# Patient Record
Sex: Female | Born: 1960 | Race: White | Hispanic: No | Marital: Married | Smoking: Former smoker
Health system: Southern US, Community
[De-identification: ages and names within clinical notes are randomized; demographics above are authoritative.]

## PROBLEM LIST (undated history)

## (undated) DIAGNOSIS — M1990 Unspecified osteoarthritis, unspecified site: Secondary | ICD-10-CM

## (undated) DIAGNOSIS — G4730 Sleep apnea, unspecified: Secondary | ICD-10-CM

## (undated) DIAGNOSIS — R112 Nausea with vomiting, unspecified: Secondary | ICD-10-CM

## (undated) DIAGNOSIS — I493 Ventricular premature depolarization: Secondary | ICD-10-CM

## (undated) DIAGNOSIS — Z9889 Other specified postprocedural states: Secondary | ICD-10-CM

## (undated) DIAGNOSIS — N200 Calculus of kidney: Secondary | ICD-10-CM

## (undated) DIAGNOSIS — M810 Age-related osteoporosis without current pathological fracture: Secondary | ICD-10-CM

## (undated) DIAGNOSIS — I10 Essential (primary) hypertension: Secondary | ICD-10-CM

## (undated) DIAGNOSIS — Z923 Personal history of irradiation: Secondary | ICD-10-CM

## (undated) DIAGNOSIS — K219 Gastro-esophageal reflux disease without esophagitis: Secondary | ICD-10-CM

## (undated) DIAGNOSIS — R7303 Prediabetes: Secondary | ICD-10-CM

## (undated) DIAGNOSIS — IMO0001 Reserved for inherently not codable concepts without codable children: Secondary | ICD-10-CM

## (undated) DIAGNOSIS — Z9221 Personal history of antineoplastic chemotherapy: Secondary | ICD-10-CM

## (undated) DIAGNOSIS — M109 Gout, unspecified: Secondary | ICD-10-CM

## (undated) DIAGNOSIS — M549 Dorsalgia, unspecified: Secondary | ICD-10-CM

## (undated) DIAGNOSIS — E039 Hypothyroidism, unspecified: Secondary | ICD-10-CM

## (undated) DIAGNOSIS — R0600 Dyspnea, unspecified: Secondary | ICD-10-CM

## (undated) DIAGNOSIS — IMO0002 Reserved for concepts with insufficient information to code with codable children: Secondary | ICD-10-CM

## (undated) DIAGNOSIS — T7840XA Allergy, unspecified, initial encounter: Secondary | ICD-10-CM

## (undated) DIAGNOSIS — J189 Pneumonia, unspecified organism: Secondary | ICD-10-CM

## (undated) DIAGNOSIS — Z87442 Personal history of urinary calculi: Secondary | ICD-10-CM

## (undated) DIAGNOSIS — K760 Fatty (change of) liver, not elsewhere classified: Secondary | ICD-10-CM

## (undated) DIAGNOSIS — F419 Anxiety disorder, unspecified: Secondary | ICD-10-CM

## (undated) DIAGNOSIS — E7800 Pure hypercholesterolemia, unspecified: Secondary | ICD-10-CM

## (undated) DIAGNOSIS — C50919 Malignant neoplasm of unspecified site of unspecified female breast: Secondary | ICD-10-CM

## (undated) DIAGNOSIS — L409 Psoriasis, unspecified: Secondary | ICD-10-CM

## (undated) DIAGNOSIS — R51 Headache: Secondary | ICD-10-CM

## (undated) HISTORY — DX: Gout, unspecified: M10.9

## (undated) HISTORY — DX: Malignant neoplasm of unspecified site of unspecified female breast: C50.919

## (undated) HISTORY — DX: Psoriasis, unspecified: L40.9

## (undated) HISTORY — PX: MYOMECTOMY: SHX85

## (undated) HISTORY — DX: Gastro-esophageal reflux disease without esophagitis: K21.9

## (undated) HISTORY — DX: Reserved for concepts with insufficient information to code with codable children: IMO0002

## (undated) HISTORY — PX: CHOLECYSTECTOMY: SHX55

## (undated) HISTORY — DX: Unspecified osteoarthritis, unspecified site: M19.90

## (undated) HISTORY — DX: Reserved for inherently not codable concepts without codable children: IMO0001

## (undated) HISTORY — PX: TONSILLECTOMY: SUR1361

## (undated) HISTORY — DX: Allergy, unspecified, initial encounter: T78.40XA

## (undated) HISTORY — DX: Age-related osteoporosis without current pathological fracture: M81.0

## (undated) HISTORY — PX: TUBAL LIGATION: SHX77

## (undated) HISTORY — DX: Dorsalgia, unspecified: M54.9

## (undated) HISTORY — PX: CARDIAC CATHETERIZATION: SHX172

## (undated) HISTORY — PX: OTHER SURGICAL HISTORY: SHX169

## (undated) HISTORY — PX: LIPOMA EXCISION: SHX5283

---

## 1998-03-22 ENCOUNTER — Other Ambulatory Visit: Admission: RE | Admit: 1998-03-22 | Discharge: 1998-03-22 | Payer: Self-pay | Admitting: Obstetrics and Gynecology

## 2000-04-14 ENCOUNTER — Encounter: Payer: Self-pay | Admitting: *Deleted

## 2000-04-18 ENCOUNTER — Observation Stay (HOSPITAL_COMMUNITY): Admission: RE | Admit: 2000-04-18 | Discharge: 2000-04-19 | Payer: Self-pay | Admitting: *Deleted

## 2000-04-18 ENCOUNTER — Encounter (INDEPENDENT_AMBULATORY_CARE_PROVIDER_SITE_OTHER): Payer: Self-pay | Admitting: Specialist

## 2000-12-22 ENCOUNTER — Other Ambulatory Visit: Admission: RE | Admit: 2000-12-22 | Discharge: 2000-12-22 | Payer: Self-pay | Admitting: Obstetrics and Gynecology

## 2001-02-09 ENCOUNTER — Encounter (INDEPENDENT_AMBULATORY_CARE_PROVIDER_SITE_OTHER): Payer: Self-pay | Admitting: Specialist

## 2001-02-09 ENCOUNTER — Observation Stay (HOSPITAL_COMMUNITY): Admission: RE | Admit: 2001-02-09 | Discharge: 2001-02-09 | Payer: Self-pay | Admitting: Obstetrics and Gynecology

## 2002-02-08 ENCOUNTER — Other Ambulatory Visit: Admission: RE | Admit: 2002-02-08 | Discharge: 2002-02-08 | Payer: Self-pay | Admitting: Obstetrics and Gynecology

## 2003-03-04 ENCOUNTER — Ambulatory Visit (HOSPITAL_COMMUNITY): Admission: RE | Admit: 2003-03-04 | Discharge: 2003-03-04 | Payer: Self-pay | Admitting: Obstetrics and Gynecology

## 2003-03-04 ENCOUNTER — Encounter (INDEPENDENT_AMBULATORY_CARE_PROVIDER_SITE_OTHER): Payer: Self-pay

## 2006-08-25 ENCOUNTER — Ambulatory Visit: Payer: Self-pay | Admitting: Family Medicine

## 2006-08-25 LAB — CONVERTED CEMR LAB
BUN: 17 mg/dL (ref 6–23)
Calcium: 9.1 mg/dL (ref 8.4–10.5)
Chloride: 108 meq/L (ref 96–112)
Creatinine, Ser: 0.7 mg/dL (ref 0.4–1.2)

## 2006-09-22 ENCOUNTER — Ambulatory Visit: Payer: Self-pay | Admitting: Family Medicine

## 2006-09-22 LAB — CONVERTED CEMR LAB
Chol/HDL Ratio, serum: 4.8
Cholesterol: 208 mg/dL (ref 0–200)
GFR calc non Af Amer: 82 mL/min
Glucose, Bld: 88 mg/dL (ref 70–99)
HDL: 43.4 mg/dL (ref >39.0)
LDL DIRECT: 149.3 mg/dL
Potassium: 3.4 meq/L — ABNORMAL LOW (ref 3.5–5.1)
Sodium: 142 meq/L (ref 135–145)
Triglyceride fasting, serum: 209 mg/dL (ref 0–149)
VLDL: 42 mg/dL — ABNORMAL HIGH (ref 0–40)

## 2006-10-02 ENCOUNTER — Ambulatory Visit: Payer: Self-pay | Admitting: Family Medicine

## 2006-12-22 ENCOUNTER — Ambulatory Visit: Payer: Self-pay | Admitting: Family Medicine

## 2006-12-22 LAB — CONVERTED CEMR LAB
CO2: 29 meq/L (ref 19–32)
GFR calc Af Amer: 115 mL/min
GFR calc non Af Amer: 95 mL/min
Glucose, Bld: 100 mg/dL — ABNORMAL HIGH (ref 70–99)
Sodium: 142 meq/L (ref 135–145)

## 2007-01-05 ENCOUNTER — Ambulatory Visit: Payer: Self-pay | Admitting: Family Medicine

## 2007-01-19 ENCOUNTER — Ambulatory Visit: Payer: Self-pay | Admitting: Family Medicine

## 2007-01-19 LAB — CONVERTED CEMR LAB
Basophils Absolute: 0 10*3/uL (ref 0.0–0.1)
Chloride: 108 meq/L (ref 96–112)
Eosinophils Absolute: 0.2 10*3/uL (ref 0.0–0.6)
Eosinophils Relative: 2 % (ref 0.0–5.0)
GFR calc Af Amer: 137 mL/min
GFR calc non Af Amer: 113 mL/min
Glucose, Bld: 109 mg/dL — ABNORMAL HIGH (ref 70–99)
HCT: 36.4 % (ref 36.0–46.0)
Lymphocytes Relative: 20 % (ref 12.0–46.0)
MCHC: 34.7 g/dL (ref 30.0–36.0)
MCV: 91 fL (ref 78.0–100.0)
Magnesium: 2 mg/dL (ref 1.5–2.5)
Neutro Abs: 6.8 10*3/uL (ref 1.4–7.7)
Neutrophils Relative %: 69.6 % (ref 43.0–77.0)
Platelets: 329 10*3/uL (ref 150–400)
RBC: 4 M/uL (ref 3.87–5.11)
Sodium: 142 meq/L (ref 135–145)

## 2007-01-28 ENCOUNTER — Ambulatory Visit: Payer: Self-pay | Admitting: Cardiovascular Disease

## 2007-02-09 ENCOUNTER — Ambulatory Visit: Payer: Self-pay

## 2007-02-09 ENCOUNTER — Encounter: Payer: Self-pay | Admitting: Cardiology

## 2007-09-02 ENCOUNTER — Ambulatory Visit: Payer: Self-pay | Admitting: Family Medicine

## 2007-09-02 DIAGNOSIS — J019 Acute sinusitis, unspecified: Secondary | ICD-10-CM | POA: Insufficient documentation

## 2007-09-02 DIAGNOSIS — I10 Essential (primary) hypertension: Secondary | ICD-10-CM

## 2007-09-02 DIAGNOSIS — J309 Allergic rhinitis, unspecified: Secondary | ICD-10-CM | POA: Insufficient documentation

## 2007-09-02 DIAGNOSIS — J069 Acute upper respiratory infection, unspecified: Secondary | ICD-10-CM | POA: Insufficient documentation

## 2007-09-03 ENCOUNTER — Encounter (INDEPENDENT_AMBULATORY_CARE_PROVIDER_SITE_OTHER): Payer: Self-pay | Admitting: Family Medicine

## 2007-09-03 LAB — CONVERTED CEMR LAB
CO2: 27 meq/L (ref 19–32)
Calcium: 10.1 mg/dL (ref 8.4–10.5)
GFR calc Af Amer: 137 mL/min
GFR calc non Af Amer: 113 mL/min
Glucose, Bld: 103 mg/dL — ABNORMAL HIGH (ref 70–99)
Potassium: 3.9 meq/L (ref 3.5–5.1)
Sodium: 139 meq/L (ref 135–145)

## 2007-11-02 ENCOUNTER — Ambulatory Visit: Payer: Self-pay | Admitting: Family Medicine

## 2007-11-03 ENCOUNTER — Encounter (INDEPENDENT_AMBULATORY_CARE_PROVIDER_SITE_OTHER): Payer: Self-pay | Admitting: *Deleted

## 2007-11-03 LAB — CONVERTED CEMR LAB
Chloride: 103 meq/L (ref 96–112)
GFR calc Af Amer: 98 mL/min
GFR calc non Af Amer: 81 mL/min
Glucose, Bld: 101 mg/dL — ABNORMAL HIGH (ref 70–99)
Potassium: 3.9 meq/L (ref 3.5–5.1)
Sodium: 140 meq/L (ref 135–145)

## 2007-11-17 ENCOUNTER — Encounter: Payer: Self-pay | Admitting: Family Medicine

## 2007-12-04 ENCOUNTER — Ambulatory Visit: Payer: Self-pay | Admitting: Family Medicine

## 2007-12-07 LAB — CONVERTED CEMR LAB
Calcium: 10 mg/dL (ref 8.4–10.5)
Chloride: 103 meq/L (ref 96–112)
Cholesterol: 279 mg/dL (ref 0–200)
Creatinine, Ser: 0.7 mg/dL (ref 0.4–1.2)
GFR calc non Af Amer: 95 mL/min
Glucose, Bld: 92 mg/dL (ref 70–99)
Sodium: 139 meq/L (ref 135–145)
Total CHOL/HDL Ratio: 5.8

## 2007-12-08 LAB — CONVERTED CEMR LAB
AST: 45 units/L — ABNORMAL HIGH (ref 0–37)
BUN: 18 mg/dL (ref 6–23)
CO2: 28 meq/L (ref 19–32)
GFR calc Af Amer: 115 mL/min
GFR calc non Af Amer: 95 mL/min
Glucose, Bld: 92 mg/dL (ref 70–99)
HDL: 48.3 mg/dL (ref >39.0)
Potassium: 3.7 meq/L (ref 3.5–5.1)
Total CHOL/HDL Ratio: 5.8
Triglycerides: 175 mg/dL — ABNORMAL HIGH (ref 0–149)

## 2007-12-09 ENCOUNTER — Telehealth (INDEPENDENT_AMBULATORY_CARE_PROVIDER_SITE_OTHER): Payer: Self-pay | Admitting: Family Medicine

## 2007-12-10 ENCOUNTER — Telehealth (INDEPENDENT_AMBULATORY_CARE_PROVIDER_SITE_OTHER): Payer: Self-pay | Admitting: *Deleted

## 2007-12-10 ENCOUNTER — Encounter (INDEPENDENT_AMBULATORY_CARE_PROVIDER_SITE_OTHER): Payer: Self-pay | Admitting: *Deleted

## 2008-01-06 ENCOUNTER — Ambulatory Visit: Payer: Self-pay | Admitting: Family Medicine

## 2008-01-08 LAB — CONVERTED CEMR LAB
AFP-Tumor Marker: 3.6 ng/mL (ref 0.0–8.0)
Ceruloplasmin: 47 mg/dL (ref 21–63)

## 2008-01-10 ENCOUNTER — Encounter (INDEPENDENT_AMBULATORY_CARE_PROVIDER_SITE_OTHER): Payer: Self-pay | Admitting: *Deleted

## 2008-01-11 ENCOUNTER — Ambulatory Visit: Payer: Self-pay | Admitting: Family Medicine

## 2008-01-11 DIAGNOSIS — R5383 Other fatigue: Secondary | ICD-10-CM

## 2008-01-11 DIAGNOSIS — R5381 Other malaise: Secondary | ICD-10-CM | POA: Insufficient documentation

## 2008-01-12 ENCOUNTER — Ambulatory Visit: Payer: Self-pay | Admitting: Family Medicine

## 2008-01-13 ENCOUNTER — Encounter (INDEPENDENT_AMBULATORY_CARE_PROVIDER_SITE_OTHER): Payer: Self-pay | Admitting: *Deleted

## 2008-01-13 LAB — CONVERTED CEMR LAB
Alkaline Phosphatase: 65 units/L (ref 39–117)
Basophils Absolute: 0 10*3/uL (ref 0.0–0.1)
Bilirubin, Direct: 0.1 mg/dL (ref 0.0–0.3)
Cortisol, Plasma: 9.6 ug/dL
Eosinophils Absolute: 0.3 10*3/uL (ref 0.0–0.7)
HDL: 38.4 mg/dL — ABNORMAL LOW (ref >39.0)
MCHC: 32.7 g/dL (ref 30.0–36.0)
MCV: 92.8 fL (ref 78.0–100.0)
Neutrophils Relative %: 52.6 % (ref 43.0–77.0)
Platelets: 278 10*3/uL (ref 150–400)
T3, Free: 3.4 pg/mL (ref 2.3–4.2)
TSH: 10.73 microintl units/mL — ABNORMAL HIGH (ref 0.35–5.50)
Total Bilirubin: 0.5 mg/dL (ref 0.3–1.2)
Triglycerides: 198 mg/dL — ABNORMAL HIGH (ref 0–149)
VLDL: 40 mg/dL (ref 0–40)
Vit D, 1,25-Dihydroxy: 39 (ref 30–89)

## 2008-01-14 ENCOUNTER — Encounter (INDEPENDENT_AMBULATORY_CARE_PROVIDER_SITE_OTHER): Payer: Self-pay | Admitting: *Deleted

## 2008-02-18 ENCOUNTER — Ambulatory Visit: Payer: Self-pay | Admitting: Internal Medicine

## 2008-02-18 ENCOUNTER — Telehealth (INDEPENDENT_AMBULATORY_CARE_PROVIDER_SITE_OTHER): Payer: Self-pay | Admitting: *Deleted

## 2008-02-18 DIAGNOSIS — H1045 Other chronic allergic conjunctivitis: Secondary | ICD-10-CM | POA: Insufficient documentation

## 2008-03-07 ENCOUNTER — Encounter: Admission: RE | Admit: 2008-03-07 | Discharge: 2008-03-07 | Payer: Self-pay | Admitting: Allergy and Immunology

## 2008-03-21 ENCOUNTER — Ambulatory Visit: Payer: Self-pay | Admitting: Family Medicine

## 2008-03-21 LAB — CONVERTED CEMR LAB: TSH: 4.52 microintl units/mL (ref 0.35–5.50)

## 2008-03-22 ENCOUNTER — Encounter (INDEPENDENT_AMBULATORY_CARE_PROVIDER_SITE_OTHER): Payer: Self-pay | Admitting: *Deleted

## 2008-06-10 ENCOUNTER — Telehealth (INDEPENDENT_AMBULATORY_CARE_PROVIDER_SITE_OTHER): Payer: Self-pay | Admitting: *Deleted

## 2008-10-12 ENCOUNTER — Emergency Department (HOSPITAL_BASED_OUTPATIENT_CLINIC_OR_DEPARTMENT_OTHER): Admission: EM | Admit: 2008-10-12 | Discharge: 2008-10-12 | Payer: Self-pay

## 2008-10-12 ENCOUNTER — Ambulatory Visit: Payer: Self-pay | Admitting: Diagnostic Radiology

## 2009-03-12 ENCOUNTER — Emergency Department (HOSPITAL_BASED_OUTPATIENT_CLINIC_OR_DEPARTMENT_OTHER): Admission: EM | Admit: 2009-03-12 | Discharge: 2009-03-12 | Payer: Self-pay | Admitting: Emergency Medicine

## 2009-03-13 ENCOUNTER — Emergency Department (HOSPITAL_COMMUNITY): Admission: EM | Admit: 2009-03-13 | Discharge: 2009-03-14 | Payer: Self-pay | Admitting: Family Medicine

## 2009-03-27 ENCOUNTER — Encounter: Admission: RE | Admit: 2009-03-27 | Discharge: 2009-03-27 | Payer: Self-pay | Admitting: Gastroenterology

## 2009-12-18 ENCOUNTER — Telehealth (INDEPENDENT_AMBULATORY_CARE_PROVIDER_SITE_OTHER): Payer: Self-pay | Admitting: *Deleted

## 2010-02-27 ENCOUNTER — Ambulatory Visit: Payer: Self-pay | Admitting: Diagnostic Radiology

## 2010-02-27 ENCOUNTER — Emergency Department (HOSPITAL_BASED_OUTPATIENT_CLINIC_OR_DEPARTMENT_OTHER): Admission: EM | Admit: 2010-02-27 | Discharge: 2010-02-27 | Payer: Self-pay | Admitting: Emergency Medicine

## 2010-03-23 ENCOUNTER — Encounter: Payer: Self-pay | Admitting: Cardiovascular Disease

## 2010-03-26 ENCOUNTER — Encounter: Payer: Self-pay | Admitting: Cardiovascular Disease

## 2010-05-10 DIAGNOSIS — R1013 Epigastric pain: Secondary | ICD-10-CM | POA: Insufficient documentation

## 2010-05-10 DIAGNOSIS — E039 Hypothyroidism, unspecified: Secondary | ICD-10-CM

## 2010-05-11 DIAGNOSIS — R930 Abnormal findings on diagnostic imaging of skull and head, not elsewhere classified: Secondary | ICD-10-CM

## 2010-05-11 DIAGNOSIS — E785 Hyperlipidemia, unspecified: Secondary | ICD-10-CM | POA: Insufficient documentation

## 2010-05-11 DIAGNOSIS — L408 Other psoriasis: Secondary | ICD-10-CM | POA: Insufficient documentation

## 2010-05-11 DIAGNOSIS — R002 Palpitations: Secondary | ICD-10-CM | POA: Insufficient documentation

## 2010-05-21 ENCOUNTER — Ambulatory Visit: Payer: Self-pay | Admitting: Cardiovascular Disease

## 2010-05-21 DIAGNOSIS — R0789 Other chest pain: Secondary | ICD-10-CM | POA: Insufficient documentation

## 2010-06-11 ENCOUNTER — Ambulatory Visit: Payer: Self-pay

## 2010-06-11 ENCOUNTER — Ambulatory Visit: Payer: Self-pay | Admitting: Cardiovascular Disease

## 2010-06-11 ENCOUNTER — Ambulatory Visit: Payer: Self-pay | Admitting: Cardiology

## 2010-06-11 ENCOUNTER — Ambulatory Visit (HOSPITAL_COMMUNITY): Admission: RE | Admit: 2010-06-11 | Discharge: 2010-06-11 | Payer: Self-pay | Admitting: Cardiovascular Disease

## 2010-06-11 ENCOUNTER — Encounter: Payer: Self-pay | Admitting: Cardiovascular Disease

## 2010-11-01 NOTE — Progress Notes (Signed)
   Faxed all Cardiac over to [facility] to fax [phone] [facility]  December 18, 2009 2:55 PM

## 2010-11-01 NOTE — Letter (Signed)
Summary: [facility]   [facility] Note   Imported By: Roderic Ovens 05/23/2010 12:00:20  _____________________________________________________________________  External Attachment:    Type:   Image     Comment:   External Document

## 2010-11-01 NOTE — Assessment & Plan Note (Signed)
Summary: ec3/ PALPS. PT HAS UHC/ GD  Medications Added * SYNTHROID  TABS (LEVOTHYROXINE SODIUM) Take one tablet daily TRIAMTERENE-HCTZ 37.5-25 MG TABS (TRIAMTERENE-HCTZ) 1 tab qd ASPIRIN 81 MG  TABS (ASPIRIN) coated asa  1 tab qd      Allergies Added:   Visit Type:  Follow-up Primary Provider:  [facility]  CC:  palpitations- pt not sure if its her thyroid acting up. Pt on asa because she could of had a possilbe stroke while getting checked out for her neck.  History of Present Illness: 50 yo previous smoker referred by primary  Alexandria Bradley with [facility].  She is a previous smoker with chest heaviness, palpitaitons and HTN.  She was seen at [facility] urgent care in May with negative W/U.  Had negative w/u in 2008 for palpitations with normal echo.  Since may continues to have benign sounding paplpations with chest heaviness.  No associated syncope or diaphoresis. I s being followed for thyroid nodule and ? lung nodule.  No recent PFT's and quit smoking more than 5 years ago.  Palpitaitons not worse with exercise.  Intermitant but persistant.  No recent ETT  Current Problems (verified): 1)  Other Chest Pain  (ICD-786.59) 2)  Palpitations  (ICD-785.1) 3)  Hypertension  (ICD-401.9) 4)  Hyperlipidemia  (ICD-272.4) 5)  Chest Xray, Abnormal  (ICD-793.1) 6)  Psoriasis  (ICD-696.1) 7)  Hypothyroidism  (ICD-244.9) 8)  Epigastric Pain  (ICD-789.06) 9)  Conjunctivitis, Allergic  (ICD-372.14) 10)  Malaise and Fatigue  (ICD-780.79) 11)  Allergic Rhinitis Cause Unspecified  (ICD-477.9) 12)  Sinusitis- Acute-nos  (ICD-461.9) 13)  Uri  (ICD-465.9) 14)  Allergic Rhinitis  (ICD-477.9)  Current Medications (verified): 1)  Norvasc 5 Mg  Tabs (Amlodipine Besylate) .Marland Kitchen.. 1 By Mouth Once Daily 2)  Synthroid  Tabs (Levothyroxine Sodium) .... Take One Tablet Daily 3)  Triamterene-Hctz 37.5-25 Mg Tabs (Triamterene-Hctz) .Marland Kitchen.. 1 Tab Qd 4)  Aspirin 81 Mg  Tabs (Aspirin)  .... Coated Asa  1 Tab Qd  Allergies (verified): 1)  ! Cortisone  Past History:  Past Medical History: Last updated: 05/10/2010 PALPITATIONS  Chest Pain:  Atypical seen in [facility] ER 5/11 R/O Eval in cardiology 2008 with normal echo HYPERTENSION HYPERLIPIDEMIA  CHEST XRAY, ABNORMAL PSORIASIS HYPOTHYROIDISM  EPIGASTRIC PAIN  CONJUNCTIVITIS, ALLERGIC  MALAISE AND FATIGUE  ALLERGIC RHINITIS CAUSE UNSPECIFIED  SINUSITIS- ACUTE-NOS  URI  ALLERGIC RHINITIS  Past Surgical History: Last updated: 05/10/2010 Cholecystectomy Tubal ligation Tonsillectomy Uterine fibroids removed 1968 Adenoidectomy:1968  Family History: Last updated: 05/10/2010 Mother: Acoustic nueroma, thyroid diease Father: lung cancer Grandmother: Stroke grandfather: Stroke Sister: thyroid disease  Social History: Last updated: 05/10/2010 Full Time Married  Tobacco Use - No.  Alcohol Use - no Drug Use - no  Review of Systems       Denies fever, malais, weight loss, blurry vision, decreased visual acuity, cough, sputum, SOB, hemoptysis, pleuritic pain,heartburn, abdominal pain, melena, lower extremity edema, claudication, or rash.   Vital Signs:  Patient profile:   50 year old female Height:      65 inches Weight:      177 pounds BMI:     29.56 Pulse rate:   76 / minute BP sitting:   124 / 78  (left arm)  Vitals Entered By: Burnett Kanaris, CNA (May 21, 2010 2:52 PM)  Physical Exam  General:  Affect appropriate Healthy:  appears stated age HEENT: normal Neck supple with no adenopathy JVP normal no bruits no thyromegaly  Lungs clear with no wheezing and good diaphragmatic motion Heart:  S1/S2 no murmur,rub, gallop or click PMI normal Abdomen: benighn, BS positve, no tenderness, no AAA no bruit.  No HSM or HJR Distal pulses intact with no bruits No edema Neuro non-focal Skin warm and dry    Impression & Recommendations:  Problem # 1:  PALPITATIONS (ICD-785.1) Beingin.  Avoid low  potassium with diuretic.  No need for event monitor.  Normal ECG at [facility] Her updated medication list for this problem includes:    Norvasc 5 Mg Tabs (Amlodipine besylate) .Marland Kitchen... 1 by mouth once daily    Aspirin 81 Mg Tabs (Aspirin) .Marland Kitchen... Coated asa  1 tab qd  Orders: Stress Echo (Stress Echo)  Problem # 2:  HYPERTENSION (ICD-401.9) Well controlled The following medications were removed from the medication list:    Triamterene-hctz 50-25 Mg Caps (Triamterene-hctz) .Marland Kitchen... 1 by mouth qd Her updated medication list for this problem includes:    Norvasc 5 Mg Tabs (Amlodipine besylate) .Marland Kitchen... 1 by mouth once daily    Triamterene-hctz 37.5-25 Mg Tabs (Triamterene-hctz) .Marland Kitchen... 1 tab qd    Aspirin 81 Mg Tabs (Aspirin) .Marland Kitchen... Coated asa  1 tab qd  Problem # 3:  HYPERLIPIDEMIA (ICD-272.4) Coninue diet Rx.  f/U [place].  Needs dietary consult.  F/U in 6 months The following medications were removed from the medication list:    Crestor 10 Mg Tabs (Rosuvastatin calcium) .Marland Kitchen... Take one tablet nightly at bedtime.  CHOL: 243 (01/12/2008)   LDL: DEL (01/12/2008)   HDL: 38.4 (01/12/2008)   TG: 198 (01/12/2008)  Problem # 4:  OTHER CHEST PAIN (ICD-786.59) Atypical.  F/U stress echo given concomitant HTN and palpiations Her updated medication list for this problem includes:    Norvasc 5 Mg Tabs (Amlodipine besylate) .Marland Kitchen... 1 by mouth once daily    Aspirin 81 Mg Tabs (Aspirin) .Marland Kitchen... Coated asa  1 tab qd  Orders: Stress Echo (Stress Echo) Echocardiogram (Echo)  Problem # 5:  CHEST XRAY, ABNORMAL (ICD-793.1) F/U recomendations for lung nodule in previous smoker  Patient Instructions: 1)  Your physician recommends that you schedule a follow-up appointment in: as needed with Dr. Eden Emms 2)  Your physician has requested that you have an echocardiogram.  Echocardiography is a painless test that uses sound waves to create images of your heart. It provides your doctor with information about the size and  shape of your heart and how well your heart's chambers and valves are working.  This procedure takes approximately one hour. There are no restrictions for this procedure. 3)  Your physician has requested that you have a stress echocardiogram. For further information please visit [email or web address]  Please follow instruction sheet as given.

## 2010-11-01 NOTE — Letter (Signed)
Summary: [facility]  [facility]   Imported By: Marylou Mccoy 05/16/2010 16:57:38  _____________________________________________________________________  External Attachment:    Type:   Image     Comment:   External Document

## 2010-12-17 LAB — POCT CARDIAC MARKERS
CKMB, poc: 1.2 ng/mL (ref 1.0–8.0)
Myoglobin, poc: 78.3 ng/mL (ref 12–200)
Troponin i, poc: <0.05 ng/mL (ref 0.00–0.09)

## 2011-01-07 LAB — URINALYSIS, ROUTINE W REFLEX MICROSCOPIC
Ketones, ur: NEGATIVE mg/dL
Nitrite: NEGATIVE
Nitrite: NEGATIVE
Protein, ur: NEGATIVE mg/dL
Protein, ur: NEGATIVE mg/dL
Urobilinogen, UA: 0.2 mg/dL (ref 0.0–1.0)

## 2011-01-07 LAB — COMPREHENSIVE METABOLIC PANEL
Alkaline Phosphatase: 112 U/L (ref 39–117)
Alkaline Phosphatase: 131 U/L — ABNORMAL HIGH (ref 39–117)
BUN: 22 mg/dL (ref 6–23)
BUN: 12 mg/dL (ref 6–23)
CO2: 29 mEq/L (ref 19–32)
GFR calc non Af Amer: >60 mL/min (ref >60)
Glucose, Bld: 125 mg/dL — ABNORMAL HIGH (ref 70–99)
Glucose, Bld: 107 mg/dL — ABNORMAL HIGH (ref 70–99)
Potassium: 3.7 mEq/L (ref 3.5–5.1)
Potassium: 3.7 mEq/L (ref 3.5–5.1)
Total Bilirubin: 0.3 mg/dL (ref 0.3–1.2)
Total Protein: 7.5 g/dL (ref 6.0–8.3)
Total Protein: 7.1 g/dL (ref 6.0–8.3)

## 2011-01-07 LAB — DIFFERENTIAL
Basophils Absolute: 0.1 10*3/uL (ref 0.0–0.1)
Basophils Absolute: 0 10*3/uL (ref 0.0–0.1)
Basophils Relative: 1 % (ref 0–1)
Basophils Relative: 0 % (ref 0–1)
Monocytes Relative: 5 % (ref 3–12)
Monocytes Relative: 7 % (ref 3–12)
Neutro Abs: 8.4 10*3/uL — ABNORMAL HIGH (ref 1.7–7.7)
Neutro Abs: 5.2 10*3/uL (ref 1.7–7.7)
Neutrophils Relative %: 74 % (ref 43–77)
Neutrophils Relative %: 68 % (ref 43–77)

## 2011-01-07 LAB — CBC
HCT: 36.4 % (ref 36.0–46.0)
HCT: 34.9 % — ABNORMAL LOW (ref 36.0–46.0)
Hemoglobin: 12.7 g/dL (ref 12.0–15.0)
Hemoglobin: 11.9 g/dL — ABNORMAL LOW (ref 12.0–15.0)
MCHC: 34.2 g/dL (ref 30.0–36.0)
RDW: 12.4 % (ref 11.5–15.5)
RDW: 13.3 % (ref 11.5–15.5)

## 2011-01-07 LAB — LIPASE, BLOOD: Lipase: 26 U/L (ref 11–59)

## 2011-01-07 LAB — HEPATITIS B SURFACE ANTIGEN: Hepatitis B Surface Ag: NEGATIVE

## 2011-01-14 LAB — COMPREHENSIVE METABOLIC PANEL
ALT: 39 U/L — ABNORMAL HIGH (ref 0–35)
AST: 27 U/L (ref 0–37)
Albumin: 5.2 g/dL (ref 3.5–5.2)
Alkaline Phosphatase: 108 U/L (ref 39–117)
Chloride: 99 mEq/L (ref 96–112)
GFR calc Af Amer: >60 mL/min (ref >60)
Potassium: 4.3 mEq/L (ref 3.5–5.1)
Sodium: 139 mEq/L (ref 135–145)
Total Bilirubin: 0.7 mg/dL (ref 0.3–1.2)
Total Protein: 9 g/dL — ABNORMAL HIGH (ref 6.0–8.3)

## 2011-01-14 LAB — DIFFERENTIAL
Eosinophils Absolute: 0.1 10*3/uL (ref 0.0–0.7)
Lymphocytes Relative: 13 % (ref 12–46)
Lymphs Abs: 1.7 10*3/uL (ref 0.7–4.0)
Monocytes Relative: 7 % (ref 3–12)
Neutro Abs: 10.4 10*3/uL — ABNORMAL HIGH (ref 1.7–7.7)
Neutrophils Relative %: 79 % — ABNORMAL HIGH (ref 43–77)

## 2011-01-14 LAB — CBC
HCT: 39.1 % (ref 36.0–46.0)
Platelets: 296 10*3/uL (ref 150–400)
RDW: 12.7 % (ref 11.5–15.5)
WBC: 13.3 10*3/uL — ABNORMAL HIGH (ref 4.0–10.5)

## 2011-02-12 NOTE — Assessment & Plan Note (Signed)
[facility]                            CARDIOLOGY OFFICE NOTE   [name], [name]                   MRN:          [id number]  DATE:01/28/2007                            DOB:          August 05, 1961    Alexandria Bradley is a delightful 50 year old patient referred by Dr.  Blossom Hoops.  She has had multiple complaints.   Her coronary risk factors include hypertension.   She has had some atypical chest pain; the worst episode occurred over a  3-day period in April.  Her diuretics have been changed. She felt ill.  There was some accompanying nausea and vomiting. This resolved on its  own.  In general, she does not get exertional pain. She has not had  associated diaphoresis or shortness of breath.  The pain was in the left  side of her chest, and felt like she was aching all over.  It has not  recurred.  She has also had years and years of palpitations that sound  benign.  There has never been associated syncope.  There are occasional  skips.  There is never prolonged runs of very rapid heart beat, and she  has never passed out or had presyncope.   The patient also has had some lower extremity edema.  This has been on  and off.  It sounds benign and dependent.  There has been no history of  DVT, no history of heart failure or pulmonary hypertension.   The patient has had her blood pressure medicines adjusted by Dr.  Blossom Hoops.  She is currently taking Benicar; I believe the dose is  supposed to be increased to 40/12.5 in the next week or so.  She says  she has had hypertension for a long, long time.  Her coronary risk  factors are otherwise negative.   REVIEW OF SYSTEMS:  Remarkable for no significant headaches.  She has  not had any thyroid problems in the past.  Otherwise, as noted in the  HPI.   The patient has also had some dizziness.  Again, this sounds benign and  not materially postural.  She does not get vertiginous symptoms with it.  She has  had this over the years as well and is currently resolved.   Meds:  Benicar HCTZ 40/12.5mg  once daily   She has no significant allergies.  She quit smoking in 2003.  She drinks  3-4 cups of coffee a day.   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   She is happily married with two kids.  She is otherwise fairly  sedentary.  Her only previous surgery has included gallbladder, tubal  ligation, tonsils, adenoids, and fibroids.   Her mother is alive at age 99.  Her father died of lung cancer with a  brain metastasis at age 65.   PHYSICAL EXAMINATION:  Healthy appearing white female in no distress  VITAL SIGNS: Blood pressure 128/70, pulse 70 and regular. Afebrile RR 12  HEENT:  Normal.  NECK:  There is no thyromegaly and no lymphadenopathy.  Carotids have no  bruits.  LUNGS:  Clear.  JVP  is not elevated. No accesory muscle use  HEART:  S1, S2 with normal heart sounds. PMI not palpable  ABDOMEN:  Benign. No masses AAA, HSM or HJR  EXTREMITIES:  Intact pulses, no edema.  NEUROLOGIC:  Nonfocal.  SKIN:  Warm and dry.  No muscular weakness   Baseline EKG is totally normal.   IMPRESSION:  All of the patient's symptoms sound benign.  Her chest pain  was atypical and has resolved.  Her palpitations have not caused any  significant syncope, shortness of breath, or chest pain.  She has no  evidence of long QT syndrome or abnormal cardiac arrhythmias.   I do not think a CardioNet monitor is needed at this time.  Her  dizziness also sounds benign and may be more related to the inner ear.  There is no evidence of cervical bruits or vertebrobasilar  insufficiency.   The patient's dyspnea and lower extremity edema also appear benign.  She  actually had no significant edema on exam today.  Given the  constellation of symptoms, I think it is reasonable to do a 2-D  echocardiogram to assess RV and LV function and rule out occult  pulmonary hypertension or congenital heart  disease.   Her hypertension seems well controlled.  She will probably increase  herself to Benicar/hydrochlorothiazide 40/12.5 and follow up with Dr.  Blossom Hoops.  He will keep an eye on her potassium and her hypotensive  medicines.  As long as her echocardiogram is normal, she will follow up  with [place] p.r.n.     Alexandria Bradley. Alexandria Emms, MD, [facility]  Electronically Signed    PCN/MedQ  DD: 01/28/2007  DT: 01/28/2007  Job #: [id number]

## 2011-02-15 NOTE — Assessment & Plan Note (Signed)
[facility]                        [place] OFFICE NOTE   HURLEY, BLEVINS                   MRN:          [id number]  DATE:01/19/2007                            DOB:          1961/01/17    REASON FOR VISIT:  One month follow up.   HISTORY OF PRESENT ILLNESS:  Mrs. Pollak is a 50 year old female who  was followed one month ago for hypertension.  At that point, her  hydrochlorothiazide was decreased from 50 mg to 25 mg secondary to side  effects.  The first week of April, the patient complaint of three days  of chest heaviness,  nausea and dyspnea.  The patient felt this was due  to the change in her medication, i.e. decreased dose in  hydrochlorothiazide.  She was advised to go to the emergency department  at that point, but reported that her symptoms resolved and she decided  not to go.  She has not had any recurrence.  She does report that her  symptoms were associated with some palpitation.  Denied other symptoms.   PAST MEDICAL HISTORY:  1. Hypertension.  2. Chronic pedal edema.   PAST SURGICAL HISTORY:  1. Tonsillectomy.  2. Tubal ligation.  3. Cholecystectomy.   MEDICATIONS:  1. Benazepril 10 mg.  2. Hydrochlorothiazide 25 mg.  3. K-Dur 20 mEq.   ALLERGIES:  No known drug allergies.   REVIEW OF SYSTEMS:  As per HPI.   OBJECTIVE:  VITAL SIGNS:  Weight 189.0, temperature 98.4, pulse 68,  blood pressure 140/100.  GENERAL:  Pleasant female in no acute distress.  Questioned  appropriately.  NECK:  Supple.  No lymphadenopathy, carotid bruits or JVD.  No  thyromegaly.  LUNGS:  Clear.  HEART:  Regular rate and rhythm, normal S1, S2.  No murmurs, gallops,  rubs.  EXTREMITIES:  No cyanosis, clubbing or edema noted.   STUDIES:  EKG showed normal sinus rhythm with no ST elevations or  depressions.  No PVC's or PAC's.  No C wave.   IMPRESSION:  A 50 year old female with three day history of chest pain,  dyspnea,  nausea three weeks ago.  Risk factors include hypertension  which is currently elevated.   PLAN:  1. Advise the patient that if her symptoms recur she should seek      emergent medical attention.  2. Should continue with once daily aspirin 81mg .  3. Will refer patient to Cardiology.  4. Will discontinue Benazepril, hydrochlorothiazide and K-Dur.  Of      note, the patient is having difficulty taking potassium      supplements.  5. Will start up with Benicar HCT 20/12.5 x1 week and increase to      40/12.5 thereafter.  The patient will follow up with me in 2-3      weeks or sooner if need be.  6. Patient to check her blood pressure at home daily and call me with      any concerns.  7. Will obtain a CBC, B-met, magnesium and TSH level.     Leanne Chang, M.D.  Electronically Signed    LA/MedQ  DD:  01/19/2007  DT: 01/19/2007  Job #: [id number]

## 2011-02-15 NOTE — Op Note (Signed)
[facility]  Patient:    Alexandria Bradley, Alexandria Bradley                     MRN: [id number] Proc. Date: 04/18/00 Adm. Date:  [id number] Disc. Date: [id number] Attending:  Kandis Mannan CC:         Redmond Baseman, M.D.             Thomas B. Samuella Cota, M.D.                           Operative Report  CCS#:  [id number]  PREOPERATIVE DIAGNOSIS:  Chronic cholecystitis with cholelithiasis.  POSTOPERATIVE DIAGNOSIS:  Chronic cholecystitis with coli.  OPERATION PERFORMED:  Laparoscopic cholecystectomy.  SURGEON:  Rozetta Nunnery, M.D.  ASSISTANT:  Mosetta Anis, M.D.  ANESTHESIA:  General, Dr. Almeta Monas and CRNA.  DESCRIPTION OF PROCEDURE:  The patient was taken to the operating room, placed on the table in supine position after satisfactory general anesthetic with intubation, the entire abdomen was prepped and draped in a sterile field. A small vertical infraumbilical incision was made through skin and subcutaneous tissue. The midline fascia was divided and a finger was placed into the peritoneal cavity. A pursestring suture of #0 Vicryl was placed. The Hasson trocar was placed in the abdomen and the abdomen insufflated to 14 mmHg pressure. A second 10 mm trocar was placed just to the right of midline and subxiphoid area. Two 5 mm trocars were placed laterally. Explanation of the abdomen with the ______ reveal no significant abnormality. The gallbladder was placed on traction and the common duct was readily seen. The cystic duct was dissected free and was a fairly short cystic duct. The patient had a small artery which was coming along side the cystic duct and this could not safely be dissected free. Since the cystic duct was short and essentially normal size, it was elected not to do an operative cholangiogram. Three clips were placed on the cystic duct on the remaining side and once on the gallbladder side and divided. The cystic artery was then identified superior to  this where it was doubly clipped on the remaining side and once on the gallbladder side and divided. The gallbladder was dissected at the gallbladder bed using the cautery. Another posterior artery was identified and this was doubly clipped on the remaining side and divided. The gallbladder was dissected from the bed with bleeding being controlled with the cautery. The gallbladder was then easily delivered through the umbilical incision. By palpation, the patient only had 1 fairly large round gallstone. The right upper quadrant was irrigated, fluid was clear. The pursestring suture at the infraumbilical incision was tied to give good closure of the fascia. The 2 lateral trocars were removed under direct vision and there was no bleeding. The abdomen was deflated and a second 10 mm trocar removed. A 0.25% Marcaine without epinephrine has been injected at all trocar sites. The 2 midline incisions were closed with running subcuticular sutures of 5-0 Vicryl. The 2 lateral trocar sites were closed with single simple inverted subcuticular 5-0 Vicryl. Benzoin and 0.25 inch Steri-Strips were applied to reinforce the skin closures. Dry sterile dressings were applied. The patient tolerated the procedure well and was taken to the PACU in satisfactory condition. DD:  04/18/00 TD:  04/21/00 Job: [id number]

## 2011-02-15 NOTE — H&P (Signed)
   NAME:  VANETTA, RULE                      ACCOUNT NO.:  [id number]   MEDICAL RECORD NO.:  [id number]                   PATIENT TYPE:  AMB   LOCATION:  [facility]                                  FACILITY:  [facility]   PHYSICIAN:  Lenoard Aden, M.D.             DATE OF BIRTH:  Jan 11, 1961   DATE OF ADMISSION:  DATE OF DISCHARGE:                                HISTORY & PHYSICAL   CHIEF COMPLAINT:  Menometorrhagia and submucous fibroid.   HISTORY OF PRESENT ILLNESS:  A 50 year old white female, G3, P3, with a  history of irregular uterine bleeding, status post hysteroscopy May 2002,  complicated by small fundal perforation which did not allow for  dictation  ends at this point.                                               Lenoard Aden, M.D.    RJT/MEDQ  D:  03/04/2003  T:  03/04/2003  Job:  [id number]

## 2011-02-15 NOTE — H&P (Signed)
   NAME:  Alexandria Bradley, Alexandria Bradley                      ACCOUNT NO.:  [id number]   MEDICAL RECORD NO.:  [id number]                   PATIENT TYPE:  AMB   LOCATION:  [facility]                                  FACILITY:  [facility]   PHYSICIAN:  Lenoard Aden, M.D.             DATE OF BIRTH:  [birth year]   DATE OF ADMISSION:  03/04/2003  DATE OF DISCHARGE:                                HISTORY & PHYSICAL   CHIEF COMPLAINT:  Menometrorrhagia.   HISTORY OF PRESENT ILLNESS:  A 50 year old white female G3 P3 who presents  with menometrorrhagia, secondary anemia, history of hysteroscopy with  suboptimal resection of fibroid due to uterine perforation in 2002.  Presents for reevaluation today.   PAST MEDICAL HISTORY:  Remarkable for hypertension.   MEDICATIONS:  Lotensin and hydrochlorothiazide.   ALLERGIES:  No known drug allergies.   HOSPITALIZATIONS:  No previous medical hospitalizations.  History of SVD x3.   FAMILY HISTORY:  Noncontributory.   REVIEW OF SYSTEMS:  Otherwise noncontributory.   PHYSICAL EXAMINATION:  GENERAL:  Well-developed, well-nourished white female  in no acute distress.  HEENT:  Normal.  LUNGS:  Clear.  HEART:  Regular rate and rhythm.  ABDOMEN:  Soft and nontender.  Uterus small, anteflexed.  No adnexal masses.  EXTREMITIES:  Reveal no cords.  NEUROLOGIC:  Nonfocal.   IMPRESSION:  1. Menometrorrhagia with known structural lesion, history of uterine     perforation.  2. Cervical stenosis status post laminaria placement.   PLAN:  Proceed with diagnostic hysteroscopy, resectoscopic  polypectomy/myomectomy.  Risks of anesthesia, infection, bleeding, injury to  abdominal organs with need for repair discussed.  Delayed versus immediate  complications to include bowel and bladder injury noted.  The patient  acknowledges and wishes to proceed.                                               Lenoard Aden, M.D.    RJT/MEDQ  D:  03/04/2003  T:  03/04/2003  Job:   [id number]

## 2011-02-15 NOTE — H&P (Signed)
[facility]  Patient:    Alexandria Bradley, Alexandria Bradley                   MRN: [id number] Adm. Date:  [id number] Attending:  Lenoard Aden                         History and Physical  CHIEF COMPLAINT:              Dysfunctional uterine bleeding with questionable structural lesion.  HISTORY OF PRESENT ILLNESS:   The patient is a 50 year old white female, G 3, P 3, who presents with irregular uterine bleeding, normal laboratory workup. An ultrasound suggests a 1.5 cm anterior wall uterine mass, for definitive evaluation.  PAST MEDICAL HISTORY:         Remarkable for hypertension.  CURRENT MEDICATIONS:          1. Lotensin.                               2. HCTZ.  ALLERGIES:                    No known drug allergies.  HOSPITALIZATIONS:             No medical or surgical hospitalizations.  FAMILY HISTORY:               Noncontributory.  OBSTETRICAL HISTORY:          History of three vaginal deliveries.  PHYSICAL EXAMINATION:  GENERAL:                      She is a well-developed, well-nourished white female, in no apparent distress.  HEENT:                        Normal.  LUNGS:                        Clear.  HEART:                        A regular rhythm.  ABDOMEN:                      Soft, obese, nontender.  PELVIC:                       Borderline enlarged midpostion anteflexed uterus.  No adnexal masses.  EXTREMITIES:                  No cords.  NEUROLOGIC:                   Nonfocal.  IMPRESSION:                   1. Perimenopausal dysfunctional uterine bleeding                                  with questionable structural lesion, on                                  saline sonohysterography.  2. Hypertension.  PLAN:                         Proceed with a diagnostic hysteroscopy, resectoscopic removal of uterine mass, and D&C.  The risks of anesthesia, infection, bleeding, injury to abdominal organs, and  need for repair is discussed.  The risks of uterine perforation with a need for repair noted. The patient acknowledges and will proceed.DD:  02/09/01 TD:  02/09/01 Job: [id number]

## 2011-02-15 NOTE — Op Note (Signed)
   NAME:  Alexandria Bradley, Alexandria Bradley                      ACCOUNT NO.:  [id number]   MEDICAL RECORD NO.:  [id number]                   PATIENT TYPE:  AMB   LOCATION:  SDC                                  FACILITY:  [facility]   PHYSICIAN:  Lenoard Aden, M.D.             DATE OF BIRTH:  27-Jul-1961   DATE OF PROCEDURE:  03/04/2003  DATE OF DISCHARGE:                                 OPERATIVE REPORT   PREOPERATIVE DIAGNOSES:  1. Menometrorrhagia.  2. Probable submucous fibroid.  3. Cervical stenosis.   POSTOPERATIVE DIAGNOSES:  1. Menometrorrhagia.  2. Probable submucous fibroid.  3. Cervical stenosis.   PROCEDURES:  1. Diagnostic hysteroscopy and laminaria removal.  2. Dilatation and curettage.  3. Resectoscopic myomectomy.   SURGEON:  Lenoard Aden, M.D.   ANESTHESIA:  General.   ESTIMATED BLOOD LOSS:  Less than 50 mL.   COMPLICATIONS:  None.   DRAINS:  None.   COUNTS:  Correct.   Patient to recovery in good condition.   SPECIMENS:  Endometrial fibroid and endometrial curettings to pathology.   BRIEF OPERATIVE NOTE:  After being apprised of the risks of anesthesia,  bleeding, injury to abdominal organs with need for repair, the patient was  brought to the operating room, where she was administered a general  anesthetic without complications, prepped and draped in the usual sterile  fashion after removal of two sponges and a laminaria.  The bladder was  drained until empty, a weighted speculum placed, dilute Pitressin solution  placed at 3 and 9 o'clock cervicovaginal junction, 16 mL total.  Paracervical block using 1% Nesacaine solution, a total of 20 mL used.  Cervix is easily dilated up to a 31 Pratt dilator.  Hysteroscope is placed.  Visualization reveals a large posterior wall submucous fibroid extending to  the fundal area, which is resected in multiple passes using the right angle  loop.  Normal tubal ostia and no evidence of uterine perforation.  D&C  accomplished.  Re-visualization reveals no  evidence of endometrial structural defects at this time.  Good hemostasis is  noted.  Fluid deficit of 30 mL.  The patient tolerates the procedure well,  all instruments are removed, and she is transferred to recovery in good  condition.                                               Lenoard Aden, M.D.    RJT/MEDQ  D:  03/04/2003  T:  03/04/2003  Job:  [id number]

## 2011-02-15 NOTE — Op Note (Signed)
[facility]  Patient:    Alexandria Bradley, Alexandria Bradley                     MRN: [id number] Proc. Date: 02/09/01 Attending:  Lenoard Aden, M.D. [facility]   Operative Report  PREOPERATIVE DIAGNOSIS:       Perimenopausal dysfunctional uterine bleeding with structural lesion on ultrasound.  POSTOPERATIVE DIAGNOSIS:      Perimenopausal dysfunctional uterine bleeding with structural lesion on ultrasound.  OPERATION:                    1. Diagnostic hysteroscopy.                               2. Resectoscopic polypectomy.                               3. Dilatation and curettage.  SURGEON:                      Lenoard Aden, M.D.  ANESTHESIA:                   General.  ESTIMATED BLOOD LOSS:         50 cc.  COMPLICATIONS:                Small fundal uterine perforation, not bleeding.  DRAINS:                       None.  COUNTS:                       Correct.  FLUID DEFICIT:                190 cc.  DISPOSITION:                  The patient went to the recovery room in good condition.  DESCRIPTION OF PROCEDURE:     After being apprised of risks of anesthesia, infection, bleeding, injury to abdominal organs, need for repair, possibility of uterine perforation and need for repair, the patient was brought to the operating room where she was administered general anesthesia without complication, and prepped and draped in the usual sterile fashion.  After achieving adequate general anesthesia, feet are placed in the ______ stirrups.  The patient is catheterized until bladder was empty.  At this time, uterus sounds to 11 cm.  It is an anteflexed, anteverted uterus with no adnexal masses which is noted on exam under anesthesia.  Weighted speculum placed.   Single-tooth tenaculum placed on the anterior lip of the cervix. Pitressin solution is injected at the cervicovaginal junction, 10 units in 100 cc, 5 cc at 3 and 9 oclock.  After  this, the uterine cervix is dilated easily up to a #33 Pratt dilator.  Diagnostic hysteroscope is placed with visualization revealing a normal endometrial cavity with thickened endometrium along the lower uterine segment and a polypoid mass extending from the anterior wall up to the uterine fundus.  The thickened posterior lower uterine segment, which was noted on ultrasound, is resected using the double angle loop and circumferentially around the uterine cavity along the lower uterine segment under direct visualization for removal of this thickened endometrium which is  sent in multiple portions.  At this time, the polypoid mass, which is large and attached to the uterine fundus, is resected using a double angle loop.  Upon resecting the mass, which was done with good hemostasis, it is noted that there is an area of attenuation along the removal area in the left uterine fundus area; however, this area is intact.  D&C is then performed, and upon probing the cavity with small curet, a small perforation is created along this area of thinning.  The hysteroscope is placed briefly afterwards and noted a small fundal perforation with no evidence of bleeding at that time. Instruments are then removed.  Fluid deficit of 190 cc is noted.  Cervix is observed, no evidence of bleeding is noted.  Instruments are removed from the vagina.  The patient tolerated the procedure well and is transferred to recovery in good condition . DD:  02/09/01 TD:  02/09/01 Job: [id number]

## 2012-12-08 ENCOUNTER — Other Ambulatory Visit: Payer: Self-pay | Admitting: Endocrinology

## 2012-12-08 DIAGNOSIS — E041 Nontoxic single thyroid nodule: Secondary | ICD-10-CM

## 2013-01-18 ENCOUNTER — Ambulatory Visit
Admission: RE | Admit: 2013-01-18 | Discharge: 2013-01-18 | Disposition: A | Discharge status: Home / Self Care | Payer: 59 | Source: Ambulatory Visit | Attending: Endocrinology | Admitting: Endocrinology

## 2013-01-18 DIAGNOSIS — E041 Nontoxic single thyroid nodule: Secondary | ICD-10-CM

## 2013-09-30 HISTORY — PX: BREAST LUMPECTOMY: SHX2

## 2013-12-14 ENCOUNTER — Other Ambulatory Visit: Payer: Self-pay | Admitting: Endocrinology

## 2013-12-14 DIAGNOSIS — E041 Nontoxic single thyroid nodule: Secondary | ICD-10-CM

## 2014-02-08 ENCOUNTER — Other Ambulatory Visit: Payer: Self-pay | Admitting: Radiology

## 2014-02-11 ENCOUNTER — Telehealth: Payer: Self-pay | Admitting: *Deleted

## 2014-02-11 DIAGNOSIS — C50212 Malignant neoplasm of upper-inner quadrant of left female breast: Secondary | ICD-10-CM

## 2014-02-11 DIAGNOSIS — Z171 Estrogen receptor negative status [ER-]: Secondary | ICD-10-CM | POA: Insufficient documentation

## 2014-02-11 NOTE — Telephone Encounter (Signed)
Confirmed [facility] for 02/16/14 at 1200.  Instructions and contact information given. 

## 2014-02-16 ENCOUNTER — Other Ambulatory Visit (HOSPITAL_BASED_OUTPATIENT_CLINIC_OR_DEPARTMENT_OTHER): Payer: 59

## 2014-02-16 ENCOUNTER — Telehealth: Payer: Self-pay | Admitting: Oncology

## 2014-02-16 ENCOUNTER — Encounter: Payer: 59 | Admitting: Specialist

## 2014-02-16 ENCOUNTER — Ambulatory Visit (HOSPITAL_BASED_OUTPATIENT_CLINIC_OR_DEPARTMENT_OTHER): Payer: 59 | Admitting: General Surgery

## 2014-02-16 ENCOUNTER — Encounter: Payer: Self-pay | Admitting: Oncology

## 2014-02-16 ENCOUNTER — Ambulatory Visit: Payer: 59 | Attending: General Surgery | Admitting: Physical Therapy

## 2014-02-16 ENCOUNTER — Ambulatory Visit
Admission: RE | Admit: 2014-02-16 | Discharge: 2014-02-16 | Disposition: A | Discharge status: Home / Self Care | Payer: 59 | Source: Ambulatory Visit | Attending: Radiation Oncology | Admitting: Radiation Oncology

## 2014-02-16 ENCOUNTER — Encounter (INDEPENDENT_AMBULATORY_CARE_PROVIDER_SITE_OTHER): Payer: Self-pay | Admitting: General Surgery

## 2014-02-16 ENCOUNTER — Ambulatory Visit: Payer: 59

## 2014-02-16 ENCOUNTER — Ambulatory Visit (HOSPITAL_BASED_OUTPATIENT_CLINIC_OR_DEPARTMENT_OTHER): Payer: 59 | Admitting: Oncology

## 2014-02-16 ENCOUNTER — Other Ambulatory Visit: Payer: Self-pay | Admitting: *Deleted

## 2014-02-16 VITALS — BP 121/76 | HR 92 | Temp 98.1°F | Resp 18 | Ht 65.0 in | Wt 193.0 lb

## 2014-02-16 DIAGNOSIS — C50219 Malignant neoplasm of upper-inner quadrant of unspecified female breast: Secondary | ICD-10-CM

## 2014-02-16 DIAGNOSIS — C50919 Malignant neoplasm of unspecified site of unspecified female breast: Secondary | ICD-10-CM | POA: Insufficient documentation

## 2014-02-16 DIAGNOSIS — C50212 Malignant neoplasm of upper-inner quadrant of left female breast: Secondary | ICD-10-CM

## 2014-02-16 DIAGNOSIS — R293 Abnormal posture: Secondary | ICD-10-CM | POA: Insufficient documentation

## 2014-02-16 DIAGNOSIS — IMO0001 Reserved for inherently not codable concepts without codable children: Secondary | ICD-10-CM | POA: Insufficient documentation

## 2014-02-16 DIAGNOSIS — Z171 Estrogen receptor negative status [ER-]: Secondary | ICD-10-CM

## 2014-02-16 LAB — CBC WITH DIFFERENTIAL/PLATELET
BASO%: 1.2 % (ref 0.0–2.0)
Basophils Absolute: 0.1 10*3/uL (ref 0.0–0.1)
EOS%: 1.7 % (ref 0.0–7.0)
Eosinophils Absolute: 0.2 10*3/uL (ref 0.0–0.5)
HCT: 40.5 % (ref 34.8–46.6)
HGB: 13.5 g/dL (ref 11.6–15.9)
LYMPH#: 2.5 10*3/uL (ref 0.9–3.3)
LYMPH%: 28.3 % (ref 14.0–49.7)
MCH: 30.5 pg (ref 25.1–34.0)
MCHC: 33.5 g/dL (ref 31.5–36.0)
MCV: 91.2 fL (ref 79.5–101.0)
MONO#: 0.5 10*3/uL (ref 0.1–0.9)
MONO%: 6.2 % (ref 0.0–14.0)
NEUT#: 5.5 10*3/uL (ref 1.5–6.5)
NEUT%: 62.6 % (ref 38.4–76.8)
Platelets: 388 10*3/uL (ref 145–400)
RBC: 4.44 10*6/uL (ref 3.70–5.45)
RDW: 13.4 % (ref 11.2–14.5)
WBC: 8.7 10*3/uL (ref 3.9–10.3)

## 2014-02-16 LAB — COMPREHENSIVE METABOLIC PANEL (CC13)
ALT: 59 U/L — AB (ref 0–55)
AST: 34 U/L (ref 5–34)
Albumin: 4.4 g/dL (ref 3.5–5.0)
Alkaline Phosphatase: 81 U/L (ref 40–150)
Anion Gap: 15 mEq/L — ABNORMAL HIGH (ref 3–11)
BUN: 15.3 mg/dL (ref 7.0–26.0)
CALCIUM: 10.3 mg/dL (ref 8.4–10.4)
CHLORIDE: 106 meq/L (ref 98–109)
CO2: 21 mEq/L — ABNORMAL LOW (ref 22–29)
CREATININE: 0.8 mg/dL (ref 0.6–1.1)
Glucose: 152 mg/dl — ABNORMAL HIGH (ref 70–140)
POTASSIUM: 3.5 meq/L (ref 3.5–5.1)
SODIUM: 143 meq/L (ref 136–145)
TOTAL PROTEIN: 7.8 g/dL (ref 6.4–8.3)
Total Bilirubin: 0.34 mg/dL (ref 0.20–1.20)

## 2014-02-16 NOTE — Progress Notes (Signed)
[facility]  Telephone:([phone]) [phone] Fax:(336) [phone]     ID: [name] OB: [birth year]  MR#: [id number]  UXL#:[id number]  PCP: Lovenia Kim, MD GYN:   SU: [name] OTHER MD: [name], Delrae Rend, Wilford Corner  CHIEF COMPLAINT: "I found a lump".  BREAST CANCER HISTORY: Alexandria Bradley palpated a mass in her left breast early May and brought it immediately to her gynecologist attention. He set her up for bilateral diagnostic mammography and left ultrasonography at [facility] 02/07/2014. Mammography showed a 1.3 cm oval mass with spiculated margins in the left breast at the 11:00 position. This was palpable. Ultrasound showed a 1.1 cm tall her than wide mass with a microlobulated margins. He was hypoechoic. There were no abnormalities noted in the left axilla.  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 Lyndon Code at conference 02/16/2014), triple negative, with an MIB-1 of 50%.  The patient's subsequent history is as detailed below  INTERVAL HISTORY: [name] was evaluated in the multidisciplinary breast cancer clinic 02/16/2014 accompanied by her husband [name], her son [name], her daughter Danae Chen, and her daughter-in-law Jinny Blossom. The patient's case was also discussed the same morning at the multidisciplinary breast cancer conference.  REVIEW OF SYSTEMS: Aside from the mass itself, there were no specific symptoms leading to the diagnostic mammogram. The patient denies unusual headaches, visual changes, nausea, vomiting, stiff neck, dizziness, or gait imbalance. There has been no cough, phlegm production, or pleurisy, no chest pain or pressure, and no change in bowel or bladder habits. The patient denies fever, rash, bleeding, unexplained fatigue or unexplained weight loss. Alexandria Bradley does admit to some hot flashes, which she describes as moderate, and  which do wake her up at night sometimes. She has pains "here and there" which are not more intense or frequent or persistent than before. She has a history of psoriasis. A detailed review of systems was otherwise entirely negative.   PAST MEDICAL HISTORY: Past Medical History  Diagnosis Date  . Thyroid disease   . Osteoporosis   . Psoriasis   . Breast cancer   . Arthritis   . Back pain     PAST SURGICAL HISTORY: Past Surgical History  Procedure Laterality Date  . Cholecystectomy    . Tonsillectomy    . Tubal ligation    . Myomectomy    . Lipoma excision      FAMILY HISTORY Family History  Problem Relation Age of Onset  . Endometrial cancer Mother   . Lung cancer Father   . Brain cancer Father    the patient's father died at the age of 34 from what may have been metastatic lung cancer. The patient knows very little about the father's side of her family. Her mother is alive at age 88. She was recently diagnosed with endometrial cancer. The patient has one brother, 2 sisters. There is no history of breast or ovarian cancer in the family.  GYNECOLOGIC HISTORY:  Menarche age 78, first live birth age 84. The patient is GX P3. One child died shortly after birth. She stopped having periods approximately 2005. She did not use hormone replacement. 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 Her husband [name] is a Insurance underwriter. Son [name] is that she Nurse, adult in [place]. Daughter [name] lives  in [place] and works for [employer] as an Therapist, sports.    ADVANCED DIRECTIVES: Not in place   HEALTH MAINTENANCE: History  Substance Use Topics  . Smoking status: Former Research scientist (life sciences)  . Smokeless tobacco: Not on file  . Alcohol Use: Yes     Colonoscopy: 2010  PAP: 2013  Bone density: 05/15/2009, at [facility], normal, lowest T score -0.8  Lipid  panel:  Allergies  Allergen Reactions  . Prednisone Shortness Of Breath    "jacks me up" jittery  . Codeine Nausea Only    Nausea   . Cortisone Swelling    "jack me up"    Current Outpatient Prescriptions  Medication Sig Dispense Refill  . amLODipine (NORVASC) 5 MG tablet       . liothyronine (CYTOMEL) 5 MCG tablet       . SYNTHROID 137 MCG tablet       . triamterene-hydrochlorothiazide (MAXZIDE-25) 37.5-25 MG per tablet        No current facility-administered medications for this visit.    OBJECTIVE: Middle-aged white woman who appears stated age 37 Vitals:   02/16/14 1250  BP: 121/76  Pulse: 92  Temp: 98.1 F (36.7 C)  Resp: 18     Body mass index is 32.12 kg/(m^2).    ECOG FS:1 - Symptomatic but completely ambulatory  Ocular: Sclerae unicteric, pupils equal, round and reactive to light Ear-nose-throat: Oropharynx clear and moist Lymphatic: No cervical or supraclavicular adenopathy Lungs no rales or rhonchi, good excursion bilaterally Heart regular rate and rhythm, no murmur appreciated Abd soft, obese, nontender, positive bowel sounds MSK no focal spinal tenderness, no joint edema Neuro: non-focal, well-oriented, appropriate affect Breasts: The right breast is unremarkable. The left breast is status post recent biopsy. There is a palpable mass in the superior aspect of the breast measuring perhaps 2 cm. There is no skin or nipple change of concern. The left axilla is benign.   LAB RESULTS:  CMP     Component Value Date/Time   NA 143 02/16/2014 1221   NA 140 03/13/2009 2325   K 3.5 02/16/2014 1221   K 3.7 03/13/2009 2325   CL 105 03/13/2009 2325   CO2 21* 02/16/2014 1221   CO2 29 03/13/2009 2325   GLUCOSE 152* 02/16/2014 1221   GLUCOSE 107* 03/13/2009 2325   GLUCOSE 88 09/22/2006 1056   BUN 15.3 02/16/2014 1221   BUN 12 03/13/2009 2325   CREATININE 0.8 02/16/2014 1221   CREATININE 0.76 03/13/2009 2325   CALCIUM 10.3 02/16/2014 1221   CALCIUM 9.4 03/13/2009 2325    PROT 7.8 02/16/2014 1221   PROT 7.1 03/13/2009 2325   ALBUMIN 4.4 02/16/2014 1221   ALBUMIN 4.1 03/13/2009 2325   AST 34 02/16/2014 1221   AST 136* 03/13/2009 2325   ALT 59* 02/16/2014 1221   ALT 303* 03/13/2009 2325   ALKPHOS 81 02/16/2014 1221   ALKPHOS 131* 03/13/2009 2325   BILITOT 0.34 02/16/2014 1221   BILITOT 0.5 03/13/2009 2325   GFRNONAA >60 03/13/2009 2325   GFRAA  Value: >60        The eGFR has been calculated using the MDRD equation. This calculation has not been validated in all clinical situations. eGFR's persistently <60 mL/min signify possible Chronic Kidney Disease. 03/13/2009 2325    I No results found for this basename: SPEP, UPEP,  kappa and lambda light chains    Lab Results  Component Value Date   WBC 8.7 02/16/2014   NEUTROABS 5.5 02/16/2014  HGB 13.5 02/16/2014   HCT 40.5 02/16/2014   MCV 91.2 02/16/2014   PLT 388 02/16/2014      Chemistry      Component Value Date/Time   NA 143 02/16/2014 1221   NA 140 03/13/2009 2325   K 3.5 02/16/2014 1221   K 3.7 03/13/2009 2325   CL 105 03/13/2009 2325   CO2 21* 02/16/2014 1221   CO2 29 03/13/2009 2325   BUN 15.3 02/16/2014 1221   BUN 12 03/13/2009 2325   CREATININE 0.8 02/16/2014 1221   CREATININE 0.76 03/13/2009 2325      Component Value Date/Time   CALCIUM 10.3 02/16/2014 1221   CALCIUM 9.4 03/13/2009 2325   ALKPHOS 81 02/16/2014 1221   ALKPHOS 131* 03/13/2009 2325   AST 34 02/16/2014 1221   AST 136* 03/13/2009 2325   ALT 59* 02/16/2014 1221   ALT 303* 03/13/2009 2325   BILITOT 0.34 02/16/2014 1221   BILITOT 0.5 03/13/2009 2325       No results found for this basename: LABCA2    No components found with this basename: LABCA125    No results found for this basename: INR,  in the last 168 hours  Urinalysis    Component Value Date/Time   COLORURINE YELLOW 03/13/2009 2251   APPEARANCEUR CLOUDY* 03/13/2009 2251   LABSPEC 1.018 03/13/2009 2251   PHURINE 5.5 03/13/2009 2251   GLUCOSEU NEGATIVE 03/13/2009 2251   HGBUR  NEGATIVE 03/13/2009 2251   BILIRUBINUR NEGATIVE 03/13/2009 2251   KETONESUR NEGATIVE 03/13/2009 2251   PROTEINUR NEGATIVE 03/13/2009 2251   UROBILINOGEN 0.2 03/13/2009 2251   NITRITE NEGATIVE 03/13/2009 2251   LEUKOCYTESUR SMALL* 03/13/2009 2251    STUDIES: Outside studies were reviewed with the patient  ASSESSMENT: 53 y.o. [place] woman status post left breast biopsy 02/08/2014 for a clinical T1c N0, stage IA invasive ductal carcinoma, grade 3, triple negative, with an MIB-1 of 50%.  PLAN: We spent the better part of today's hour-long appointment discussing the biology of breast cancer in general, and the specifics of the patient's tumor in particular. Ivylynn understands the difference between local treatments, which include surgery and radiation, and systemic therapy, which for breast cancer include the options of anti-estrogens, anti-HER-2 treatment, and chemotherapy.  Because her cancer cells do not express receptors for estrogen or HER-2, her only option for systemic treatment is chemotherapy. Accordingly we have asked Dr. Donne Hazel to place a port at the time of definitive surgery. This should be within the next 2 weeks.  Generally in cases of triple-negative disease we would like to start chemotherapy within a month of surgery. We are arranging for the patient to come to "chemotherapy school", have an echocardiogram, and then to meet me early June at which time we will discuss her surgical pathology results and the standard supportive medicines to go with her chemotherapy.  More specifically she will receive doxorubicin and Adriamycin in dose dense fashion x4, followed by paclitaxel also in dose dense fashion x4, given with Neulasta support. She will proceed to radiation at the completion of chemotherapy.  Taima has a good understanding of the overall plan. She agrees with it. She knows the goal of treatment in her case is control. She will call with any problems that may develop before  her next visit here.   Chauncey Cruel, MD   02/16/2014 5:48 PM

## 2014-02-16 NOTE — Progress Notes (Signed)
Radiation Oncology         [phone]) [phone] ________________________________  Initial outpatient Consultation - Date: 02/16/2014   Name: Alexandria Bradley MRN: [id number]   DOB: 1961-09-18  REFERRING PHYSICIAN: [name], MD  STAGE: Breast cancer of upper-inner quadrant of left female breast   Primary site: Breast (Left)   Staging method: AJCC 7th Edition   Clinical: Stage IA (T1c, N0, cM0)   Summary: Stage IA (T1c, N0, cM0)   Clinical comments: Staged at breast conference 5.20.15  HISTORY OF PRESENT ILLNESS::Alexandria Bradley is a 53 y.o. female  Palpated a left breast mass. It she had mammograms which showed a mass in the left breast. On ultrasound this showed a 1.1 cm mass. Her axilla was negative. A biopsy was performed and showed a grade 2 invasive ductal carcinoma that was triple negative with a Ki-67 of 50%. He was referred to me by Dr. Donne Hazel for consideration of radiation in the management of her newly diagnosed breast cancer. Chemotherapy is planned. She is accompanied by her husband and 3 children. She is GX P3 with her first live birth at 73. He had menses at 11 and her last period in 2005. She's not using hormone replacement.  PREVIOUS RADIATION THERAPY: No  PAST MEDICAL HISTORY:  has a past medical history of Thyroid disease; Osteoporosis; Psoriasis; Breast cancer; Arthritis; and Back pain.    PAST SURGICAL HISTORY: Past Surgical History  Procedure Laterality Date  . Cholecystectomy    . Tonsillectomy    . Tubal ligation    . Myomectomy    . Lipoma excision      FAMILY HISTORY:  Family History  Problem Relation Age of Onset  . Endometrial cancer Mother   . Lung cancer Father   . Brain cancer Father     SOCIAL HISTORY:  History  Substance Use Topics  . Smoking status: Former Research scientist (life sciences)  . Smokeless tobacco: Not on file  . Alcohol Use: Yes    ALLERGIES: Prednisone; Codeine; and Cortisone  MEDICATIONS:  Current Outpatient Prescriptions  Medication  Sig Dispense Refill  . amLODipine (NORVASC) 5 MG tablet       . liothyronine (CYTOMEL) 5 MCG tablet       . SYNTHROID 137 MCG tablet       . triamterene-hydrochlorothiazide (MAXZIDE-25) 37.5-25 MG per tablet        No current facility-administered medications for this encounter.    REVIEW OF SYSTEMS:  A 15 point review of systems is documented in the electronic medical record. This was obtained by the nursing staff. However, I reviewed this with the patient to discuss relevant findings and make appropriate changes.  Pertinent items are noted in HPI.  PHYSICAL EXAM: There were no vitals filed for this visit.. . Wasn't female who appears her stated age sitting comfortably on examining room table. She has no palpable cervical or supra-clavicular adenopathy. No palpable terry adenopathy bilaterally. No palpable abnormalities of the right breast. She has a palpable mass in the upper and her quadrant of the left breast. There is some bruising associated with this.  LABORATORY DATA:  Lab Results  Component Value Date   WBC 8.7 02/16/2014   HGB 13.5 02/16/2014   HCT 40.5 02/16/2014   MCV 91.2 02/16/2014   PLT 388 02/16/2014   Lab Results  Component Value Date   NA 143 02/16/2014   K 3.5 02/16/2014   CL 105 03/13/2009   CO2 21* 02/16/2014   Lab Results  Component Value  Date   ALT 59* 02/16/2014   AST 34 02/16/2014   ALKPHOS 81 02/16/2014   BILITOT 0.34 02/16/2014     RADIOGRAPHY: No results found.    IMPRESSION: T1 C. N0 triple negative invasive ductal carcinoma of the left breast  PLAN:spoke with the patient and her husband and family today briefly about the role of radiation and decreasing local failures in patients who undergo lumpectomy. We discussed the process of simulation the placement tattoos. We discussed 6 weeks of treatment as an outpatient. We discussed the possibility of skin redness and fatigue. We discussed use of breath hold technique for cardiac sparing. We discussed the low  likelihood of symptomatic lung or rib damage. I will plan on seeing her back after chemotherapy is complete. She has met with social work as well as Community education officer and breast cancer navigator's.  I spent 40 minutes  face to face with the patient and more than 50% of that time was spent in counseling and/or coordination of care.   ------------------------------------------------  [name], MD

## 2014-02-16 NOTE — Progress Notes (Signed)
Checked in new pt with no financial concerns. °

## 2014-02-16 NOTE — Progress Notes (Signed)
Met patient and spouse in [facility] [place]. Patient stated that she was an "8" on the distress scale. She said getting medical information was important and felt the clinic had helped with this, but she was still anxious about chemotherapy. Provided calming presence and educational information about the support center services and programs. Will follow up with her in chemo class.  Epifania Gore, PhD, [place]

## 2014-02-16 NOTE — Progress Notes (Signed)
Patient ID: Alexandria Bradley, female   DOB: [birth year], 53 y.o.   MRN: [id number]  Chief Complaint  Patient presents with  . Other    HPI Alexandria Bradley is a 53 y.o. female.  Referred by Dr Christene Slates HPI 41 yof who palpated a left breast mass a few weeks ago that showed a breast density category B breast and a 1.3 cm mass in the left breast. She has no other breast complaints and has no prior history.  She underwent an ultrasound that shows a 1.1 cm mass and a negative axilla.  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  She comes in today to be seen at our multidisciplinary clinic.  Past Medical History  Diagnosis Date  . Thyroid disease   . Osteoporosis   . Psoriasis   . Breast cancer   . Arthritis   . Back pain     Past Surgical History  Procedure Laterality Date  . Cholecystectomy    . Tonsillectomy    . Tubal ligation    . Myomectomy    . Lipoma excision      Family History  Problem Relation Age of Onset  . Endometrial cancer Mother   . Lung cancer Father   . Brain cancer Father     Social History History  Substance Use Topics  . Smoking status: Former Research scientist (life sciences)  . Smokeless tobacco: Not on file  . Alcohol Use: Yes    Allergies  Allergen Reactions  . Prednisone Shortness Of Breath    "jacks me up" jittery  . Codeine Nausea Only    Nausea   . Cortisone Swelling    "jack me up"    Current Outpatient Prescriptions  Medication Sig Dispense Refill  . amLODipine (NORVASC) 5 MG tablet       . liothyronine (CYTOMEL) 5 MCG tablet       . SYNTHROID 137 MCG tablet       . triamterene-hydrochlorothiazide (MAXZIDE-25) 37.5-25 MG per tablet        No current facility-administered medications for this visit.    Review of Systems Review of Systems  Constitutional: Negative for fever, chills and unexpected weight change.  HENT: Negative for congestion, hearing loss, sore throat,  trouble swallowing and voice change.   Eyes: Negative for visual disturbance.  Respiratory: Negative for cough and wheezing.   Cardiovascular: Negative for chest pain, palpitations and leg swelling.  Gastrointestinal: Negative for nausea, vomiting, abdominal pain, diarrhea, constipation, blood in stool, abdominal distention and anal bleeding.  Genitourinary: Negative for hematuria, vaginal bleeding and difficulty urinating.  Musculoskeletal: Positive for arthralgias.  Skin: Negative for rash and wound.  Neurological: Negative for seizures, syncope and headaches.  Hematological: Negative for adenopathy. Does not bruise/bleed easily.  Psychiatric/Behavioral: Negative for confusion.    There were no vitals taken for this visit.  Physical Exam Physical Exam  Vitals reviewed. Constitutional: She appears well-developed and well-nourished.  Eyes: No scleral icterus.  Neck: Neck supple.  Cardiovascular: Normal rate, regular rhythm and normal heart sounds.   Pulmonary/Chest: Effort normal and breath sounds normal. She has no wheezes. She has no rales. Right breast exhibits no inverted nipple, no mass, no nipple discharge, no skin change and no tenderness. Left breast exhibits mass. Left breast exhibits no inverted nipple, no nipple discharge, no skin change and no tenderness.    Lymphadenopathy:    She has no cervical adenopathy.  She has no axillary adenopathy.       Right: No supraclavicular adenopathy present.       Left: No supraclavicular adenopathy present.    Data Reviewed Path, [place] and mm reviewed  Assessment    Left breast cancer, clinical stage I, TNBC     Plan    Left breast radioactive seed guided lumpectomy, left axillary sentinel node biopsy, port placement   We discussed the staging and pathophysiology of breast cancer. We discussed all of the different options for treatment for breast cancer including surgery, chemotherapy, radiation therapy, Herceptin, and  antiestrogen therapy.   We discussed a sentinel lymph node biopsy as she does not appear to having lymph node involvement right now. We discussed the performance of that with injection of radioactive tracer. We discussed that she would have an incision underneath her axillary hairline. We discussed that there is up to a 5-10% chance of having a positive node with a sentinel lymph node biopsy and we will await the permanent pathology to make any other first further decisions in terms of her treatment. One of these options might be to return to the operating room to perform an axillary lymph node dissection. We discussed up to a 5% risk lifetime of chronic shoulder pain as well as lymphedema associated with a sentinel lymph node biopsy.  We discussed the options for treatment of the breast cancer which included lumpectomy versus a mastectomy. We discussed the performance of the lumpectomy with a wire placement. We discussed a 10-20% chance of a positive margin requiring reexcision in the operating room. We also discussed that she may need radiation therapy or antiestrogen therapy or both if she undergoes lumpectomy. We discussed the mastectomy and the postoperative care for that as well. We discussed that there is no difference in her survival whether she undergoes lumpectomy with radiation therapy or antiestrogen therapy versus a mastectomy.  We also discussed port placement at the same time We discussed the risks of operation including bleeding, infection, possible reoperation. She understands her further therapy will be based on what her stages at the time of her operation.         [name] 02/16/2014, 2:59 PM

## 2014-02-17 ENCOUNTER — Encounter: Payer: Self-pay | Admitting: *Deleted

## 2014-02-17 ENCOUNTER — Encounter: Payer: Self-pay | Admitting: Oncology

## 2014-02-17 ENCOUNTER — Telehealth (INDEPENDENT_AMBULATORY_CARE_PROVIDER_SITE_OTHER): Payer: Self-pay

## 2014-02-17 ENCOUNTER — Other Ambulatory Visit: Payer: 59

## 2014-02-17 ENCOUNTER — Telehealth: Payer: Self-pay | Admitting: Oncology

## 2014-02-17 NOTE — Telephone Encounter (Signed)
, °

## 2014-02-17 NOTE — Progress Notes (Signed)
No date for treatment as of today. °

## 2014-02-17 NOTE — Telephone Encounter (Signed)
Called pt to give my name here at the office so she could have a contact person if she had any questions before surgery. The pt said she is doing ok for now just trying to take it all in but she appreciates me calling her today. I advised pt that I know the schedulers are working on getting pt a surgery date. The pt has been in touch with Jackelyn Poling our scheduler here at [place].

## 2014-02-18 ENCOUNTER — Ambulatory Visit (HOSPITAL_COMMUNITY)
Admission: RE | Admit: 2014-02-18 | Discharge: 2014-02-18 | Disposition: A | Discharge status: Home / Self Care | Payer: 59 | Source: Ambulatory Visit | Attending: Oncology | Admitting: Oncology

## 2014-02-18 DIAGNOSIS — I77819 Aortic ectasia, unspecified site: Secondary | ICD-10-CM | POA: Insufficient documentation

## 2014-02-18 DIAGNOSIS — C50219 Malignant neoplasm of upper-inner quadrant of unspecified female breast: Secondary | ICD-10-CM | POA: Insufficient documentation

## 2014-02-18 DIAGNOSIS — I519 Heart disease, unspecified: Secondary | ICD-10-CM

## 2014-02-18 DIAGNOSIS — R079 Chest pain, unspecified: Secondary | ICD-10-CM | POA: Insufficient documentation

## 2014-02-18 NOTE — Progress Notes (Signed)
Echocardiogram 2D Echocardiogram has been performed.  Alexandria Bradley 02/18/2014, 10:01 AM

## 2014-02-22 ENCOUNTER — Telehealth: Payer: Self-pay | Admitting: *Deleted

## 2014-02-22 NOTE — Telephone Encounter (Signed)
Called and spoke with patient from [facility] 02/16/14.  Patient states she has a call into Dr. Cristal Generous office about MRI brain scans she has had done in the past that has showed some infarct and a bleed that she has never followed up on and had forgotten about. Informed her that this would be important to let Dr. Donne Hazel know before surgery. I will also give his office a call.  Encouraged patient to call with any needs.

## 2014-02-23 ENCOUNTER — Encounter (HOSPITAL_BASED_OUTPATIENT_CLINIC_OR_DEPARTMENT_OTHER): Payer: Self-pay | Admitting: *Deleted

## 2014-02-24 ENCOUNTER — Telehealth (INDEPENDENT_AMBULATORY_CARE_PROVIDER_SITE_OTHER): Payer: Self-pay

## 2014-02-24 NOTE — Telephone Encounter (Signed)
Message copied by Illene Regulus on Thu Feb 24, 2014  5:00 PM ------      Message from: Joya San      Created: Tue Feb 22, 2014  8:55 AM      Contact: [phone]       Pt having sx next week she has a few questions to ask you and Dr. Donne Hazel, please call. ------

## 2014-02-24 NOTE — Telephone Encounter (Signed)
LMOM for pt to call me. I want to go over the conversation I had with Dr Donne Hazel about her MRI scans that she faxed to me for her records.

## 2014-02-25 ENCOUNTER — Encounter (INDEPENDENT_AMBULATORY_CARE_PROVIDER_SITE_OTHER): Payer: Self-pay

## 2014-02-25 NOTE — Telephone Encounter (Signed)
Pt returned my call. I did explain that Dr Donne Hazel did review the MRI reports and he said the pt will be fine for surgery. Dr Donne Hazel does advise pt to f/u with PCP on the MRI reports in case if there is anymore f/u dealing with the MRI's it would come from PCP. The MRI reports will be scanned into pt's chart. The pt understands.

## 2014-02-28 ENCOUNTER — Encounter (HOSPITAL_BASED_OUTPATIENT_CLINIC_OR_DEPARTMENT_OTHER)
Admission: RE | Admit: 2014-02-28 | Discharge: 2014-02-28 | Disposition: A | Discharge status: Home / Self Care | Payer: 59 | Source: Ambulatory Visit | Attending: General Surgery | Admitting: General Surgery

## 2014-02-28 HISTORY — PX: BREAST LUMPECTOMY WITH AXILLARY LYMPH NODE BIOPSY: SHX5593

## 2014-03-01 ENCOUNTER — Encounter (HOSPITAL_BASED_OUTPATIENT_CLINIC_OR_DEPARTMENT_OTHER): Payer: Self-pay | Admitting: *Deleted

## 2014-03-01 ENCOUNTER — Ambulatory Visit (HOSPITAL_BASED_OUTPATIENT_CLINIC_OR_DEPARTMENT_OTHER)
Admission: RE | Admit: 2014-03-01 | Discharge: 2014-03-01 | Disposition: A | Discharge status: Home / Self Care | Payer: 59 | Source: Ambulatory Visit | Attending: General Surgery | Admitting: General Surgery

## 2014-03-01 ENCOUNTER — Ambulatory Visit (HOSPITAL_COMMUNITY): Payer: 59

## 2014-03-01 ENCOUNTER — Ambulatory Visit (HOSPITAL_BASED_OUTPATIENT_CLINIC_OR_DEPARTMENT_OTHER): Payer: 59 | Admitting: Anesthesiology

## 2014-03-01 ENCOUNTER — Encounter (HOSPITAL_BASED_OUTPATIENT_CLINIC_OR_DEPARTMENT_OTHER)
Admission: RE | Disposition: A | Discharge status: Home / Self Care | Payer: Self-pay | Source: Ambulatory Visit | Attending: General Surgery

## 2014-03-01 ENCOUNTER — Encounter (HOSPITAL_BASED_OUTPATIENT_CLINIC_OR_DEPARTMENT_OTHER): Payer: 59 | Admitting: Anesthesiology

## 2014-03-01 ENCOUNTER — Encounter (HOSPITAL_COMMUNITY)
Admission: RE | Admit: 2014-03-01 | Discharge: 2014-03-01 | Disposition: A | Discharge status: Home / Self Care | Payer: 59 | Source: Ambulatory Visit | Attending: General Surgery | Admitting: General Surgery

## 2014-03-01 DIAGNOSIS — Z87891 Personal history of nicotine dependence: Secondary | ICD-10-CM | POA: Insufficient documentation

## 2014-03-01 DIAGNOSIS — M810 Age-related osteoporosis without current pathological fracture: Secondary | ICD-10-CM | POA: Insufficient documentation

## 2014-03-01 DIAGNOSIS — C50919 Malignant neoplasm of unspecified site of unspecified female breast: Secondary | ICD-10-CM | POA: Insufficient documentation

## 2014-03-01 DIAGNOSIS — C50212 Malignant neoplasm of upper-inner quadrant of left female breast: Secondary | ICD-10-CM

## 2014-03-01 DIAGNOSIS — L408 Other psoriasis: Secondary | ICD-10-CM | POA: Insufficient documentation

## 2014-03-01 DIAGNOSIS — Z79899 Other long term (current) drug therapy: Secondary | ICD-10-CM | POA: Insufficient documentation

## 2014-03-01 DIAGNOSIS — Z885 Allergy status to narcotic agent status: Secondary | ICD-10-CM | POA: Insufficient documentation

## 2014-03-01 HISTORY — PX: PORTACATH PLACEMENT: SHX2246

## 2014-03-01 HISTORY — DX: Hypothyroidism, unspecified: E03.9

## 2014-03-01 HISTORY — DX: Anxiety disorder, unspecified: F41.9

## 2014-03-01 SURGERY — RADIOACTIVE SEED GUIDED PARTIAL MASTECTOMY WITH AXILLARY SENTINEL LYMPH NODE BIOPSY AND AXILLARY LYMPH NODE DISSECTION
Anesthesia: General | Site: Breast

## 2014-03-01 MED ORDER — HEPARIN SOD (PORK) LOCK FLUSH 100 UNIT/ML IV SOLN
INTRAVENOUS | Status: DC | PRN
  Administered 2014-03-01: 500 [IU] via INTRAVENOUS

## 2014-03-01 MED ORDER — FENTANYL CITRATE 0.05 MG/ML IJ SOLN
INTRAMUSCULAR | Status: AC
  Filled 2014-03-01: qty 2

## 2014-03-01 MED ORDER — FENTANYL CITRATE 0.05 MG/ML IJ SOLN
INTRAMUSCULAR | Status: AC
  Filled 2014-03-01: qty 6

## 2014-03-01 MED ORDER — ONDANSETRON HCL 4 MG/2ML IJ SOLN
INTRAMUSCULAR | Status: AC
  Filled 2014-03-01: qty 2

## 2014-03-01 MED ORDER — HEPARIN (PORCINE) IN NACL 2-0.9 UNIT/ML-% IJ SOLN
INTRAMUSCULAR | Status: DC | PRN
  Administered 2014-03-01: 1 via INTRAVENOUS

## 2014-03-01 MED ORDER — CEFAZOLIN SODIUM-DEXTROSE 2-3 GM-% IV SOLR
2.0000 g | INTRAVENOUS | Status: AC
  Administered 2014-03-01: 2 g via INTRAVENOUS

## 2014-03-01 MED ORDER — OXYCODONE HCL 5 MG/5ML PO SOLN: 5.0000 mg | Freq: Once | ORAL | Status: DC | PRN

## 2014-03-01 MED ORDER — BUPIVACAINE-EPINEPHRINE (PF) 0.25% -1:200000 IJ SOLN
INTRAMUSCULAR | Status: DC | PRN
  Administered 2014-03-01: 10 mL

## 2014-03-01 MED ORDER — FENTANYL CITRATE 0.05 MG/ML IJ SOLN
50.0000 ug | INTRAMUSCULAR | Status: DC | PRN
  Administered 2014-03-01: 100 ug via INTRAVENOUS

## 2014-03-01 MED ORDER — METHYLENE BLUE 1 % INJ SOLN
INTRAMUSCULAR | Status: AC
  Filled 2014-03-01: qty 10

## 2014-03-01 MED ORDER — OXYCODONE HCL 5 MG PO TABS: 5.0000 mg | ORAL_TABLET | Freq: Once | ORAL | Status: DC | PRN

## 2014-03-01 MED ORDER — LACTATED RINGERS IV SOLN
INTRAVENOUS | Status: DC
  Administered 2014-03-01 (×2): via INTRAVENOUS

## 2014-03-01 MED ORDER — MIDAZOLAM HCL 2 MG/2ML IJ SOLN
INTRAMUSCULAR | Status: AC
  Filled 2014-03-01: qty 2

## 2014-03-01 MED ORDER — TECHNETIUM TC 99M SULFUR COLLOID FILTERED
1.0000 | Freq: Once | INTRAVENOUS | Status: AC | PRN
  Administered 2014-03-01: 1 via INTRADERMAL

## 2014-03-01 MED ORDER — PROPOFOL 10 MG/ML IV BOLUS
INTRAVENOUS | Status: DC | PRN
  Administered 2014-03-01: 200 mg via INTRAVENOUS
  Administered 2014-03-01: 50 mg via INTRAVENOUS

## 2014-03-01 MED ORDER — HYDROMORPHONE HCL PF 1 MG/ML IJ SOLN
INTRAMUSCULAR | Status: AC
  Filled 2014-03-01: qty 1

## 2014-03-01 MED ORDER — SODIUM CHLORIDE 0.9 % IJ SOLN
INTRAMUSCULAR | Status: AC
  Filled 2014-03-01: qty 10

## 2014-03-01 MED ORDER — BUPIVACAINE-EPINEPHRINE 0.25% -1:200000 IJ SOLN
INTRAMUSCULAR | Status: DC | PRN
  Administered 2014-03-01: 10 mL

## 2014-03-01 MED ORDER — MIDAZOLAM HCL 2 MG/2ML IJ SOLN
1.0000 mg | INTRAMUSCULAR | Status: DC | PRN
  Administered 2014-03-01: 2 mg via INTRAVENOUS

## 2014-03-01 MED ORDER — LIDOCAINE HCL (CARDIAC) 20 MG/ML IV SOLN
INTRAVENOUS | Status: DC | PRN
  Administered 2014-03-01: 80 mg via INTRAVENOUS

## 2014-03-01 MED ORDER — ONDANSETRON HCL 4 MG/2ML IJ SOLN
4.0000 mg | Freq: Once | INTRAMUSCULAR | Status: AC | PRN
  Administered 2014-03-01: 4 mg via INTRAVENOUS

## 2014-03-01 MED ORDER — ONDANSETRON HCL 4 MG/2ML IJ SOLN
INTRAMUSCULAR | Status: DC | PRN
  Administered 2014-03-01: 4 mg via INTRAVENOUS

## 2014-03-01 MED ORDER — BUPIVACAINE HCL (PF) 0.25 % IJ SOLN
INTRAMUSCULAR | Status: AC
  Filled 2014-03-01: qty 30

## 2014-03-01 MED ORDER — BUPIVACAINE HCL (PF) 0.25 % IJ SOLN: INTRAMUSCULAR | Status: DC | PRN

## 2014-03-01 MED ORDER — METOCLOPRAMIDE HCL 5 MG/ML IJ SOLN
INTRAMUSCULAR | Status: DC | PRN
Start: 2014-03-01 — End: 2014-03-01
  Administered 2014-03-01: 10 mg via INTRAVENOUS

## 2014-03-01 MED ORDER — HEPARIN SOD (PORK) LOCK FLUSH 100 UNIT/ML IV SOLN
INTRAVENOUS | Status: AC
Start: 2014-03-01 — End: 2014-03-01
  Filled 2014-03-01: qty 5

## 2014-03-01 MED ORDER — HYDROMORPHONE HCL PF 1 MG/ML IJ SOLN
0.2500 mg | INTRAMUSCULAR | Status: DC | PRN
  Administered 2014-03-01 (×2): 0.5 mg via INTRAVENOUS

## 2014-03-01 MED ORDER — HEPARIN (PORCINE) IN NACL 2-0.9 UNIT/ML-% IJ SOLN
INTRAMUSCULAR | Status: AC
  Filled 2014-03-01: qty 500

## 2014-03-01 MED ORDER — OXYCODONE-ACETAMINOPHEN 10-325 MG PO TABS: 1.0000 | ORAL_TABLET | Freq: Four times a day (QID) | ORAL | Status: DC | PRN

## 2014-03-01 MED ORDER — FENTANYL CITRATE 0.05 MG/ML IJ SOLN
INTRAMUSCULAR | Status: DC | PRN
  Administered 2014-03-01 (×4): 25 ug via INTRAVENOUS
  Administered 2014-03-01 (×2): 50 ug via INTRAVENOUS

## 2014-03-01 SURGICAL SUPPLY — 75 items
ADH SKN CLS APL DERMABOND .7 (GAUZE/BANDAGES/DRESSINGS) ×6
APL SKNCLS STERI-STRIP NONHPOA (GAUZE/BANDAGES/DRESSINGS) ×2
APPLIER CLIP 9.375 MED OPEN (MISCELLANEOUS) ×4
APR CLP MED 9.3 20 MLT OPN (MISCELLANEOUS) ×2
BAG DECANTER FOR FLEXI CONT (MISCELLANEOUS) ×4 IMPLANT
BENZOIN TINCTURE PRP APPL 2/3 (GAUZE/BANDAGES/DRESSINGS) ×4 IMPLANT
BINDER BREAST LRG (GAUZE/BANDAGES/DRESSINGS) IMPLANT
BINDER BREAST MEDIUM (GAUZE/BANDAGES/DRESSINGS) IMPLANT
BINDER BREAST XLRG (GAUZE/BANDAGES/DRESSINGS) ×2 IMPLANT
BINDER BREAST XXLRG (GAUZE/BANDAGES/DRESSINGS) IMPLANT
BLADE 11 SAFETY STRL DISP (BLADE) ×4 IMPLANT
BLADE 15 SAFETY STRL DISP (BLADE) ×4 IMPLANT
CANISTER SUC SOCK COL 7IN (MISCELLANEOUS) IMPLANT
CANISTER SUCT 1200ML W/VALVE (MISCELLANEOUS) IMPLANT
CHLORAPREP W/TINT 26ML (MISCELLANEOUS) ×6 IMPLANT
CLIP APPLIE 9.375 MED OPEN (MISCELLANEOUS) ×2 IMPLANT
CLOSURE WOUND 1/2 X4 (GAUZE/BANDAGES/DRESSINGS) ×1
COVER MAYO STAND STRL (DRAPES) ×4 IMPLANT
COVER PROBE W GEL 5X96 (DRAPES) ×4 IMPLANT
COVER TABLE BACK 60X90 (DRAPES) ×4 IMPLANT
DECANTER SPIKE VIAL GLASS SM (MISCELLANEOUS) IMPLANT
DERMABOND ADVANCED (GAUZE/BANDAGES/DRESSINGS) ×6
DERMABOND ADVANCED .7 DNX12 (GAUZE/BANDAGES/DRESSINGS) ×2 IMPLANT
DEVICE DUBIN W/COMP PLATE 8390 (MISCELLANEOUS) ×4 IMPLANT
DRAPE C-ARM 42X72 X-RAY (DRAPES) ×4 IMPLANT
DRAPE LAPAROSCOPIC ABDOMINAL (DRAPES) ×6 IMPLANT
DRSG TEGADERM 4X4.75 (GAUZE/BANDAGES/DRESSINGS) ×4 IMPLANT
ELECT COATED BLADE 2.86 ST (ELECTRODE) ×4 IMPLANT
ELECT REM PT RETURN 9FT ADLT (ELECTROSURGICAL) ×4
ELECTRODE REM PT RTRN 9FT ADLT (ELECTROSURGICAL) ×2 IMPLANT
GLOVE BIO SURGEON STRL SZ7 (GLOVE) ×8 IMPLANT
GLOVE BIOGEL PI IND STRL 7.5 (GLOVE) ×2 IMPLANT
GLOVE BIOGEL PI INDICATOR 7.5 (GLOVE) ×4
GLOVE ECLIPSE 6.5 STRL STRAW (GLOVE) ×4 IMPLANT
GLOVE EXAM NITRILE MD LF STRL (GLOVE) ×4 IMPLANT
GOWN STRL REUS W/ TWL LRG LVL3 (GOWN DISPOSABLE) ×8 IMPLANT
GOWN STRL REUS W/TWL LRG LVL3 (GOWN DISPOSABLE) ×16
IV KIT MINILOC 20X1 SAFETY (NEEDLE) IMPLANT
KIT MARKER MARGIN INK (KITS) ×4 IMPLANT
KIT PORT POWER 8FR ISP CVUE (Catheter) ×2 IMPLANT
MARKER SKIN DUAL TIP RULER LAB (MISCELLANEOUS) ×2 IMPLANT
NDL HYPO 25X1 1.5 SAFETY (NEEDLE) ×2 IMPLANT
NDL SAFETY ECLIPSE 18X1.5 (NEEDLE) IMPLANT
NEEDLE HYPO 18GX1.5 SHARP (NEEDLE)
NEEDLE HYPO 25X1 1.5 SAFETY (NEEDLE) ×4 IMPLANT
NS IRRIG 1000ML POUR BTL (IV SOLUTION) IMPLANT
PACK BASIN DAY SURGERY FS (CUSTOM PROCEDURE TRAY) ×4 IMPLANT
PENCIL BUTTON HOLSTER BLD 10FT (ELECTRODE) ×6 IMPLANT
SHEET MEDIUM DRAPE 40X70 STRL (DRAPES) IMPLANT
SLEEVE SCD COMPRESS KNEE MED (MISCELLANEOUS) ×4 IMPLANT
SPONGE GAUZE 4X4 12PLY STER LF (GAUZE/BANDAGES/DRESSINGS) ×4 IMPLANT
SPONGE LAP 4X18 X RAY DECT (DISPOSABLE) ×4 IMPLANT
STAPLER VISISTAT 35W (STAPLE) ×4 IMPLANT
STOCKINETTE IMPERVIOUS LG (DRAPES) ×2 IMPLANT
STRIP CLOSURE SKIN 1/2X4 (GAUZE/BANDAGES/DRESSINGS) ×3 IMPLANT
SUT ETHILON 2 0 FS 18 (SUTURE) IMPLANT
SUT MNCRL AB 4-0 PS2 18 (SUTURE) ×8 IMPLANT
SUT MON AB 4-0 PC3 18 (SUTURE) ×4 IMPLANT
SUT MON AB 5-0 PS2 18 (SUTURE) IMPLANT
SUT PROLENE 2 0 SH DA (SUTURE) ×4 IMPLANT
SUT SILK 2 0 SH (SUTURE) IMPLANT
SUT SILK 2 0 TIES 17X18 (SUTURE)
SUT SILK 2-0 18XBRD TIE BLK (SUTURE) IMPLANT
SUT VIC AB 2-0 SH 27 (SUTURE) ×12
SUT VIC AB 2-0 SH 27XBRD (SUTURE) ×2 IMPLANT
SUT VIC AB 3-0 SH 27 (SUTURE) ×12
SUT VIC AB 3-0 SH 27X BRD (SUTURE) ×2 IMPLANT
SUT VIC AB 5-0 PS2 18 (SUTURE) IMPLANT
SYR 5ML LUER SLIP (SYRINGE) ×4 IMPLANT
SYR CONTROL 10ML LL (SYRINGE) ×4 IMPLANT
TOWEL OR 17X24 6PK STRL BLUE (TOWEL DISPOSABLE) ×4 IMPLANT
TOWEL OR NON WOVEN STRL DISP B (DISPOSABLE) ×4 IMPLANT
TUBE CONNECTING 20'X1/4 (TUBING) ×1
TUBE CONNECTING 20X1/4 (TUBING) ×3 IMPLANT
YANKAUER SUCT BULB TIP NO VENT (SUCTIONS) ×2 IMPLANT

## 2014-03-01 NOTE — Anesthesia Preprocedure Evaluation (Signed)
Anesthesia Evaluation  Patient identified by MRN, date of birth, ID band Patient awake    Reviewed: Allergy & Precautions, H&P , NPO status , Patient's Chart, lab work & pertinent test results  Airway Mallampati: I  TM Distance: >3 FB Neck ROM: Full    Dental  (+) Teeth Intact, Dental Advisory Given   Pulmonary former smoker,  breath sounds clear to auscultation        Cardiovascular Rhythm:Regular Rate:Normal     Neuro/Psych    GI/Hepatic   Endo/Other    Renal/GU      Musculoskeletal   Abdominal   Peds  Hematology   Anesthesia Other Findings   Reproductive/Obstetrics                             Anesthesia Physical Anesthesia Plan  ASA: II  Anesthesia Plan: General   Post-op Pain Management:    Induction: Intravenous  Airway Management Planned: Oral ETT  Additional Equipment:   Intra-op Plan:   Post-operative Plan: Extubation in OR  Informed Consent: I have reviewed the patients History and Physical, chart, labs and discussed the procedure including the risks, benefits and alternatives for the proposed anesthesia with the patient or authorized representative who has indicated his/her understanding and acceptance.   Dental advisory given  Plan Discussed with: CRNA, Anesthesiologist and Surgeon  Anesthesia Plan Comments:         Anesthesia Quick Evaluation  

## 2014-03-01 NOTE — Progress Notes (Signed)
Dr. Donne Hazel notified of xray result an had already read report and approved the placement and results of the xray.

## 2014-03-01 NOTE — Op Note (Signed)
Preoperative diagnosis: Clinical stage I left breast cancer Postoperative diagnosis: Same as above Procedure: #1 left breast radioactive seed guided lumpectomy #2 left axillary sentinel lymph node biopsy #3 right subclavian port placement Surgeon: Dr. Serita Grammes Anesthesia: Gen. With pectoral block Specimens: #1 left breast tissue marked with pain #2 left breast anterior and superior additional margin, marked short superior, long lateral, double stitch anterior #3 left axillary sentinel lymph node with count of 2706 Complications: None Drains: None Estimated blood loss: Minimal Sponge needle count was correct at completion Disposition to recovery stable  Indications: This is a 53 year old female who palpated a left breast mass. This underwent evaluation and was a 1.3 cm mass. This was biopsied and was shown to be a triple negative invasive ductal carcinoma. She was seen and evaluated in our multidisciplinary clinic. We decided to proceed with breast conservation therapy, sentinel node biopsy, and port placement due to her triple negative status. Risks and benefits were all discussed prior to beginning.  Procedure: After informed consent was obtained the patient was taken to the operating room. She had a radioactive seed placed prior to surgery. These films were available in the operating room. She was given cefazolin. Sequential compression devices were placed. She underwent a pectoral block by anesthesia prior to beginning. She was also injected with technetium in a standard periareolar fashion. She was  placed under general anesthesia without complication. Her left breast and axilla were then prepped and draped in the standard sterile surgical fashion. A surgical timeout was performed.  I located the seed using the neoprobe. I then made an incision in the superior portion of the breast and removed the skin overlying the tumor. I then used cautery to excise the seed in the tumor around it  with an attempt to get a clear margin. This was then marked with paint. Mammogram was taken confirming removal of the mass, clip, and seed. I did have pathology evaluate this in the operating room it was close at the anterior superior margin. I removed as additional margin. There is no more anterior tissue to remove. I then placed 2 clips deep and one clip in each position around the cavity. Hemostasis was obtained. I then brought the breast tissue together with 2-0 Vicryl. The dermis was brought together with 3-0 Vicryl and the skin with 4-0 Monocryl. Dermabond and Steri-Strips were placed. Marcaine was infiltrated into the skin.  I then made a 2 cm incision below the axillary hairline. I used the neoprobe to identify the sentinel node. I entered through the axillary fascia. I was able to identify what appeared to be 2 small nodes together with the counts as listed as above. There was no background radioactivity. Hemostasis was obtained. I then closed the axillary fascia with 2-0 Vicryl. The dermis was closed with 3-0 Vicryl and the skin with 4-0 Monocryl. Steri-Strips and a sterile dressing were placed.  We then broke down acid and reprepped the patient after tucking the arm to place an axillary roll. The chest was then prepped and draped again in the standard sterile surgical fashion. A surgical timeout was again performed.  I accessed the subclavian vein and passed the wire. This was confirmed to be in position with fluoroscopy. I then made a pocket below this overlying the pectoralis fascia. I then tunneled a line between the 2 sites. I then dilated the tract. I inserted the peel-away sheath and dilator assembly under fluoroscopy. I then removed the wire assembly. I then placed the line. I  then removed the peel-away sheath. The line was then pulled back to be in position the distal cava. I then attached this to the port. I sutured this in 2 positions with 0 Prolene suture to the fascia. The port flushed  easily and aspirated blood. Final fluoroscopy showed it to be in good position without any kinks. This was packed with heparin. I then closed this wound with 3-0 Vicryl 4-0 Monocryl and Dermabond. She tolerated all this well was extubated and transferred to recovery stable.

## 2014-03-01 NOTE — Anesthesia Postprocedure Evaluation (Signed)
  Anesthesia Post-op Note  Patient: Alexandria Bradley  Procedure(s) Performed: Procedure(s): RADIOACTIVE SEED GUIDED LUMPECTOMY WITH AXILLARY SENTINEL LYMPH NODE BIOPSY   (Left) INSERTION PORT-A-CATH (N/A)  Patient Location: PACU  Anesthesia Type:General  Level of Consciousness: awake  Airway and Oxygen Therapy: Patient Spontanous Breathing  Post-op Pain: mild  Post-op Assessment: Post-op Vital signs reviewed  Post-op Vital Signs: Reviewed  Last Vitals:  Filed Vitals:   03/01/14 1645  BP: 117/84  Pulse: 77  Temp:   Resp: 15    Complications: No apparent anesthesia complications

## 2014-03-01 NOTE — Interval H&P Note (Signed)
History and Physical Interval Note:  03/01/2014 1:24 PM  Alexandria Bradley  has presented today for surgery, with the diagnosis of left breast cancer  The various methods of treatment have been discussed with the patient and family. After consideration of risks, benefits and other options for treatment, the patient has consented to  Procedure(s): RADIOACTIVE SEED GUIDED LUMPECTOMY WITH AXILLARY SENTINEL LYMPH NODE BIOPSY   (Left) INSERTION PORT-A-CATH (N/A) as a surgical intervention .  The patient's history has been reviewed, patient examined, no change in status, stable for surgery.  I have reviewed the patient's chart and labs.  Questions were answered to the patient's satisfaction.     [name]

## 2014-03-01 NOTE — Anesthesia Procedure Notes (Addendum)
Procedure Name: Intubation Date/Time: 03/01/2014 1:59 PM Performed by: Lyndee Leo Pre-anesthesia Checklist: Patient identified, Emergency Drugs available, Suction available and Patient being monitored Patient Re-evaluated:Patient Re-evaluated prior to inductionOxygen Delivery Method: Circle System Utilized Preoxygenation: Pre-oxygenation with 100% oxygen Intubation Type: IV induction Ventilation: Mask ventilation without difficulty Laryngoscope Size: Miller and 3 Grade View: Grade III Tube type: Oral Tube size: 8.0 mm Number of attempts: 3 Airway Equipment and Method: stylet,  oral airway and Video-laryngoscopy Placement Confirmation: ETT inserted through vocal cords under direct vision,  positive ETCO2 and breath sounds checked- equal and bilateral Secured at: 21 cm Tube secured with: Tape Dental Injury: Bloody posterior oropharynx  Difficulty Due To: Difficulty was unanticipated and Difficult Airway- due to anterior larynx Future Recommendations: Recommend- induction with short-acting agent, and alternative techniques readily available Comments: Unantisipated bleeding with placement of Mac 3 on first attempt. Cords visualized but fluttering.  Second attempt with Sabra Heck 3 without success.  Third attempt successful with Glidescope.   Anesthesia Regional Block:  Pectoralis block  Pre-Anesthetic Checklist: ,, timeout performed, Correct Patient, Correct Site, Correct Laterality, Correct Procedure, Correct Position, site marked, Risks and benefits discussed,  Surgical consent,  Pre-op evaluation,  At surgeon's request and post-op pain management  Laterality: Left and Upper  Prep: chloraprep       Needles:  Injection technique: Single-shot  Needle Type: Echogenic Needle     Needle Length: 9cm 9 cm Needle Gauge: 21 and 21 G    Additional Needles:  Procedures: ultrasound guided (picture in chart) Pectoralis block Narrative:  Start time: 03/02/2014 1:24 PM End time: 03/02/2014  1:30 PM Injection made incrementally with aspirations every 5 mL.  Performed by: Personally  Anesthesiologist: Lorrene Reid, MD

## 2014-03-01 NOTE — Progress Notes (Signed)
X ray here to  Do X ray for port placement.

## 2014-03-01 NOTE — Transfer of Care (Signed)
Immediate Anesthesia Transfer of Care Note  Patient: Alexandria Bradley  Procedure(s) Performed: Procedure(s): RADIOACTIVE SEED GUIDED LUMPECTOMY WITH AXILLARY SENTINEL LYMPH NODE BIOPSY   (Left) INSERTION PORT-A-CATH (N/A)  Patient Location: PACU  Anesthesia Type:General  Level of Consciousness: awake and alert   Airway & Oxygen Therapy: Patient Spontanous Breathing and Patient connected to face mask oxygen  Post-op Assessment: Report given to PACU RN and Post -op Vital signs reviewed and stable  Post vital signs: Reviewed and stable  Complications: No apparent anesthesia complications

## 2014-03-01 NOTE — Discharge Instructions (Signed)
[facility] °Office Phone Number [phone] ° °BREAST BIOPSY/ PARTIAL MASTECTOMY: POST OP INSTRUCTIONS ° °Always review your discharge instruction sheet given to you by the facility where your surgery was performed. ° °IF YOU HAVE DISABILITY OR FAMILY LEAVE FORMS, YOU MUST BRING THEM TO THE OFFICE FOR PROCESSING.  DO NOT GIVE THEM TO YOUR DOCTOR. ° °1. A prescription for pain medication may be given to you upon discharge.  Take your pain medication as prescribed, if needed.  If narcotic pain medicine is not needed, then you may take acetaminophen (Tylenol), naprosyn (Alleve) or ibuprofen (Advil) as needed. °2. Take your usually prescribed medications unless otherwise directed °3. If you need a refill on your pain medication, please contact your pharmacy.  They will contact our office to request authorization.  Prescriptions will not be filled after 5pm or on week-ends. °4. You should eat very light the first 24 hours after surgery, such as soup, crackers, pudding, etc.  Resume your normal diet the day after surgery. °5. Most patients will experience some swelling and bruising in the breast.  Ice packs and a good support bra will help.  Wear the breast binder provided or a sports bra for 72 hours day and night.  After that wear a sports bra during the day until you return to the office. Swelling and bruising can take several days to resolve.  °6. It is common to experience some constipation if taking pain medication after surgery.  Increasing fluid intake and taking a stool softener will usually help or prevent this problem from occurring.  A mild laxative (Milk of Magnesia or Miralax) should be taken according to package directions if there are no bowel movements after 48 hours. °7. Unless discharge instructions indicate otherwise, you may remove your bandages 48 hours after surgery and you may shower at that time.  You may have steri-strips (small skin tapes) in place directly over the incision.   These strips should be left on the skin for 7-10 days and will come off on their own.  If your surgeon used skin glue on the incision, you may shower in 24 hours.  The glue will flake off over the next 2-3 weeks.  Any sutures or staples will be removed at the office during your follow-up visit. °8. ACTIVITIES:  You may resume regular daily activities (gradually increasing) beginning the next day.  Wearing a good support bra or sports bra minimizes pain and swelling.  You may have sexual intercourse when it is comfortable. °a. You may drive when you no longer are taking prescription pain medication, you can comfortably wear a seatbelt, and you can safely maneuver your car and apply brakes. °b. RETURN TO WORK:  ______________________________________________________________________________________ °9. You should see your doctor in the office for a follow-up appointment approximately two weeks after your surgery.  Your doctor’s nurse will typically make your follow-up appointment when she calls you with your pathology report.  Expect your pathology report 3-4 business days after your surgery.  You may call to check if you do not hear from us after three days. °10. OTHER INSTRUCTIONS: _______________________________________________________________________________________________ _____________________________________________________________________________________________________________________________________ °_____________________________________________________________________________________________________________________________________ °_____________________________________________________________________________________________________________________________________ ° °WHEN TO CALL DR WAKEFIELD: °1. Fever over 101.0 °2. Nausea and/or vomiting. °3. Extreme swelling or bruising. °4. Continued bleeding from incision. °5. Increased pain, redness, or drainage from the incision. ° °The clinic staff is available to  answer your questions during regular business hours.  Please don’t hesitate to call and ask to speak to one of the nurses for   clinical concerns.  If you have a medical emergency, go to the nearest emergency room or call 911.  A surgeon from [facility] is always on call at the hospital. ° °For further questions, please visit [email or web address] ° ° ° °Post Anesthesia Home Care Instructions ° °Activity: °Get plenty of rest for the remainder of the day. A responsible adult should stay with you for 24 hours following the procedure.  °For the next 24 hours, DO NOT: °-Drive a car °-Operate machinery °-Drink alcoholic beverages °-Take any medication unless instructed by your physician °-Make any legal decisions or sign important papers. ° °Meals: °Start with liquid foods such as gelatin or soup. Progress to regular foods as tolerated. Avoid greasy, spicy, heavy foods. If nausea and/or vomiting occur, drink only clear liquids until the nausea and/or vomiting subsides. Call your physician if vomiting continues. ° °Special Instructions/Symptoms: °Your throat may feel dry or sore from the anesthesia or the breathing tube placed in your throat during surgery. If this causes discomfort, gargle with warm salt water. The discomfort should disappear within 24 hours. ° ° °Regional Anesthesia Blocks ° °1. Numbness or the inability to move the "blocked" extremity may last from 3-48 hours after placement. The length of time depends on the medication injected and your individual response to the medication. If the numbness is not going away after 48 hours, call your surgeon. ° °2. The extremity that is blocked will need to be protected until the numbness is gone and the  Strength has returned. Because you cannot feel it, you will need to take extra care to avoid injury. Because it may be weak, you may have difficulty moving it or using it. You may not know what position it is in without looking at it while the  block is in effect. ° °3. For blocks in the legs and feet, returning to weight bearing and walking needs to be done carefully. You will need to wait until the numbness is entirely gone and the strength has returned. You should be able to move your leg and foot normally before you try and bear weight or walk. You will need someone to be with you when you first try to ensure you do not fall and possibly risk injury. ° °4. Bruising and tenderness at the needle site are common side effects and will resolve in a few days. ° °5. Persistent numbness or new problems with movement should be communicated to the surgeon or the [facility] ([phone])/ [facility] ([phone]). °

## 2014-03-01 NOTE — H&P (View-Only) (Signed)
Patient ID: CARON ODE, female   DOB: 1961/05/24, 53 y.o.   MRN: [id number]  Chief Complaint  Patient presents with  . Other    HPI NARIA ABBEY is a 53 y.o. female.  Referred by Dr Christene Slates HPI 43 yof who palpated a left breast mass a few weeks ago that showed a breast density category B breast and a 1.3 cm mass in the left breast. She has no other breast complaints and has no prior history.  She underwent an ultrasound that shows a 1.1 cm mass and a negative axilla.  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  She comes in today to be seen at our multidisciplinary clinic.  Past Medical History  Diagnosis Date  . Thyroid disease   . Osteoporosis   . Psoriasis   . Breast cancer   . Arthritis   . Back pain     Past Surgical History  Procedure Laterality Date  . Cholecystectomy    . Tonsillectomy    . Tubal ligation    . Myomectomy    . Lipoma excision      Family History  Problem Relation Age of Onset  . Endometrial cancer Mother   . Lung cancer Father   . Brain cancer Father     Social History History  Substance Use Topics  . Smoking status: Former Research scientist (life sciences)  . Smokeless tobacco: Not on file  . Alcohol Use: Yes    Allergies  Allergen Reactions  . Prednisone Shortness Of Breath    "jacks me up" jittery  . Codeine Nausea Only    Nausea   . Cortisone Swelling    "jack me up"    Current Outpatient Prescriptions  Medication Sig Dispense Refill  . amLODipine (NORVASC) 5 MG tablet       . liothyronine (CYTOMEL) 5 MCG tablet       . SYNTHROID 137 MCG tablet       . triamterene-hydrochlorothiazide (MAXZIDE-25) 37.5-25 MG per tablet        No current facility-administered medications for this visit.    Review of Systems Review of Systems  Constitutional: Negative for fever, chills and unexpected weight change.  HENT: Negative for congestion, hearing loss, sore throat,  trouble swallowing and voice change.   Eyes: Negative for visual disturbance.  Respiratory: Negative for cough and wheezing.   Cardiovascular: Negative for chest pain, palpitations and leg swelling.  Gastrointestinal: Negative for nausea, vomiting, abdominal pain, diarrhea, constipation, blood in stool, abdominal distention and anal bleeding.  Genitourinary: Negative for hematuria, vaginal bleeding and difficulty urinating.  Musculoskeletal: Positive for arthralgias.  Skin: Negative for rash and wound.  Neurological: Negative for seizures, syncope and headaches.  Hematological: Negative for adenopathy. Does not bruise/bleed easily.  Psychiatric/Behavioral: Negative for confusion.    There were no vitals taken for this visit.  Physical Exam Physical Exam  Vitals reviewed. Constitutional: She appears well-developed and well-nourished.  Eyes: No scleral icterus.  Neck: Neck supple.  Cardiovascular: Normal rate, regular rhythm and normal heart sounds.   Pulmonary/Chest: Effort normal and breath sounds normal. She has no wheezes. She has no rales. Right breast exhibits no inverted nipple, no mass, no nipple discharge, no skin change and no tenderness. Left breast exhibits mass. Left breast exhibits no inverted nipple, no nipple discharge, no skin change and no tenderness.    Lymphadenopathy:    She has no cervical adenopathy.  She has no axillary adenopathy.       Right: No supraclavicular adenopathy present.       Left: No supraclavicular adenopathy present.    Data Reviewed Path, [place] and mm reviewed  Assessment    Left breast cancer, clinical stage I, TNBC     Plan    Left breast radioactive seed guided lumpectomy, left axillary sentinel node biopsy, port placement   We discussed the staging and pathophysiology of breast cancer. We discussed all of the different options for treatment for breast cancer including surgery, chemotherapy, radiation therapy, Herceptin, and  antiestrogen therapy.   We discussed a sentinel lymph node biopsy as she does not appear to having lymph node involvement right now. We discussed the performance of that with injection of radioactive tracer. We discussed that she would have an incision underneath her axillary hairline. We discussed that there is up to a 5-10% chance of having a positive node with a sentinel lymph node biopsy and we will await the permanent pathology to make any other first further decisions in terms of her treatment. One of these options might be to return to the operating room to perform an axillary lymph node dissection. We discussed up to a 5% risk lifetime of chronic shoulder pain as well as lymphedema associated with a sentinel lymph node biopsy.  We discussed the options for treatment of the breast cancer which included lumpectomy versus a mastectomy. We discussed the performance of the lumpectomy with a wire placement. We discussed a 10-20% chance of a positive margin requiring reexcision in the operating room. We also discussed that she may need radiation therapy or antiestrogen therapy or both if she undergoes lumpectomy. We discussed the mastectomy and the postoperative care for that as well. We discussed that there is no difference in her survival whether she undergoes lumpectomy with radiation therapy or antiestrogen therapy versus a mastectomy.  We also discussed port placement at the same time We discussed the risks of operation including bleeding, infection, possible reoperation. She understands her further therapy will be based on what her stages at the time of her operation.         [name] 02/16/2014, 2:59 PM

## 2014-03-01 NOTE — Progress Notes (Signed)
Assisted Dr. Crews with left, ultrasound guided, pectoralis block. Side rails up, monitors on throughout procedure. See vital signs in flow sheet. Tolerated Procedure well. 

## 2014-03-02 ENCOUNTER — Other Ambulatory Visit (INDEPENDENT_AMBULATORY_CARE_PROVIDER_SITE_OTHER): Payer: Self-pay | Admitting: General Surgery

## 2014-03-02 ENCOUNTER — Encounter (HOSPITAL_BASED_OUTPATIENT_CLINIC_OR_DEPARTMENT_OTHER): Payer: Self-pay | Admitting: General Surgery

## 2014-03-02 ENCOUNTER — Telehealth (INDEPENDENT_AMBULATORY_CARE_PROVIDER_SITE_OTHER): Payer: Self-pay | Admitting: General Surgery

## 2014-03-02 MED ORDER — BUPIVACAINE-EPINEPHRINE (PF) 0.5% -1:200000 IJ SOLN
INTRAMUSCULAR | Status: DC | PRN
  Administered 2014-03-01: 25 mL

## 2014-03-02 MED ORDER — ONDANSETRON 4 MG PO TBDP: 4.0000 mg | ORAL_TABLET | Freq: Three times a day (TID) | ORAL | Status: DC | PRN

## 2014-03-02 NOTE — Addendum Note (Signed)
Addendum created 03/02/14 0748 by Napoleon Form, MD   Modules edited: Anesthesia Blocks and Procedures, Anesthesia Medication Administration, Clinical Notes   Clinical Notes:  File: [id number]

## 2014-03-02 NOTE — Telephone Encounter (Signed)
Pt's husband called that patient was nauseated throughout the day.  She has been taking her pain medication, but has not had much of an appetite.  She is limiting her pain Rx to see if that helps with her nausea.  I will call her in some Zofran to help with the nausea.

## 2014-03-07 ENCOUNTER — Ambulatory Visit (HOSPITAL_BASED_OUTPATIENT_CLINIC_OR_DEPARTMENT_OTHER): Payer: 59 | Admitting: Oncology

## 2014-03-07 ENCOUNTER — Telehealth: Payer: Self-pay | Admitting: Oncology

## 2014-03-07 ENCOUNTER — Other Ambulatory Visit: Payer: 59

## 2014-03-07 VITALS — BP 130/85 | HR 79 | Temp 97.8°F | Resp 18 | Ht 65.0 in | Wt 191.0 lb

## 2014-03-07 DIAGNOSIS — C50212 Malignant neoplasm of upper-inner quadrant of left female breast: Secondary | ICD-10-CM

## 2014-03-07 DIAGNOSIS — Z171 Estrogen receptor negative status [ER-]: Secondary | ICD-10-CM

## 2014-03-07 DIAGNOSIS — C50219 Malignant neoplasm of upper-inner quadrant of unspecified female breast: Secondary | ICD-10-CM

## 2014-03-07 MED ORDER — LORAZEPAM 0.5 MG PO TABS: 0.5000 mg | ORAL_TABLET | Freq: Every evening | ORAL | Status: DC | PRN

## 2014-03-07 MED ORDER — HYDROCODONE-ACETAMINOPHEN 7.5-325 MG/15ML PO SOLN: 10.0000 mL | Freq: Four times a day (QID) | ORAL | Status: DC | PRN

## 2014-03-07 MED ORDER — PROCHLORPERAZINE MALEATE 10 MG PO TABS: 10.0000 mg | ORAL_TABLET | Freq: Four times a day (QID) | ORAL | Status: DC | PRN

## 2014-03-07 MED ORDER — DEXAMETHASONE 4 MG PO TABS: ORAL_TABLET | ORAL | Status: DC

## 2014-03-07 MED ORDER — ONDANSETRON HCL 8 MG PO TABS: 8.0000 mg | ORAL_TABLET | Freq: Two times a day (BID) | ORAL | Status: DC | PRN

## 2014-03-07 MED ORDER — LIDOCAINE-PRILOCAINE 2.5-2.5 % EX CREA: 1.0000 "application " | TOPICAL_CREAM | CUTANEOUS | Status: DC | PRN

## 2014-03-07 NOTE — Telephone Encounter (Signed)
per pof to sch appts/sent MW email to sch trmts/sent [facility] staff message in re to last 3 appts awaiting answer. Pt states has MY CHART and states will review tomorrow-Adv to allow up to 24 hrs for MY CHART to update-pt understood

## 2014-03-07 NOTE — Progress Notes (Signed)
Alexandria Bradley  [phone] [phone]     ID: Alexandria Bradley OB: January 04, 1961  MR#: [id number]  OZD#:[id number]  PCP: Lovenia Kim, MD GYN:   SU: [name] OTHER MD: [name], Delrae Rend, Wilford Corner  CHIEF COMPLAINT: Triple negative breast cancer CURRENT TREATMENT: Adjuvant chemotherapy  BREAST CANCER HISTORY: From the original intake note of 02/16/2014:  Alexandria Bradley palpated a mass in her left breast early May and brought it immediately to her gynecologist attention. He set her up for bilateral diagnostic mammography and left ultrasonography at [facility] 02/07/2014. Mammography showed a 1.3 cm oval mass with spiculated margins in the left breast at the 11:00 position. This was palpable. Ultrasound showed a 1.1 cm tall her than wide mass with a microlobulated margins. He was hypoechoic. There were no abnormalities noted in the left axilla.  On 02/08/2014 the patient underwent biopsy of the left breast mass, showing (SAA 15-07/11/2005) invasive ductal carcinoma, grade 2 (but described as grade 3 by Alexandria Bradley at conference 02/16/2014), triple negative, with an MIB-1 of 50%.  The patient's subsequent history is as detailed below  INTERVAL HISTORY: Alexandria Bradley  returns today for followup of her breast cancer accompanied by her husband Alexandria Bradley and  her daughter Alexandria Bradley. Since her last visit here the patient underwent left lumpectomy with sentinel lymph node sampling on 03/01/2014, the final pathology ([id number]) confirming an invasive ductal carcinoma, measuring 1.2 cm, grade 3, with negative margins, and a negative sentinel lymph node. Repeat prognostic panel is pending the patient had a port placed at the same time as her lumpectomy.   REVIEW OF SYSTEMS: Alexandria Bradley did generally well with her surgery, but she still has a fair amount of pain. She is taking Tylenol and ibuprofen for this, with very little control, partly because she cannot tolerate the  ibuprofen, which makes her sick. She was unable to tolerate Vicodin. She tells me she does better with hydromorphone. She describes herself as fatigue in addition to feeling sore. She has not started an exercise program, and still feels kind of short of breath as she puts it. She has significant numbness in the medial aspect of the left upper arm. She feels anxious but not depressed. She is hoping to be able to return to work soon. A detailed review of systems was otherwise noncontributory  PAST MEDICAL HISTORY: Past Medical History  Diagnosis Date  . Thyroid disease   . Osteoporosis   . Psoriasis   . Breast cancer   . Arthritis   . Back pain   . Hypothyroidism   . Anxiety   . Breast cancer     left    PAST SURGICAL HISTORY: Past Surgical History  Procedure Laterality Date  . Cholecystectomy    . Tonsillectomy    . Tubal ligation    . Myomectomy    . Lipoma excision    . Portacath placement N/A 03/01/2014    Procedure: INSERTION PORT-A-CATH;  Surgeon: [name], MD;  Location: [facility];  Service: General;  Laterality: N/A;    FAMILY HISTORY Family History  Problem Relation Age of Onset  . Endometrial cancer Mother   . Lung cancer Father   . Brain cancer Father    the patient's father died at the age of 33 from what may have been metastatic lung cancer. The patient knows very little about the father's side of her family. Her mother is alive at age 44. She was recently diagnosed with endometrial cancer. The  patient has one brother, 2 sisters. There is no history of breast or ovarian cancer in the family.  GYNECOLOGIC HISTORY:  Menarche age 55, first live birth age 50. The patient is GX P3. One child died shortly after birth. She stopped having periods approximately 2005. She did not use hormone replacement. 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 Her husband Alexandria Bradley is a Insurance underwriter. Son Alexandria Bradley is that she Nurse, adult in [place]. Daughter Alexandria Bradley lives in [place] and works for [employer] as an Therapist, sports.    ADVANCED DIRECTIVES: Not in place   HEALTH MAINTENANCE: History  Substance Use Topics  . Smoking status: Former Research scientist (life sciences)  . Smokeless tobacco: Not on file  . Alcohol Use: Yes     Comment: social     Colonoscopy: 2010  PAP: 2013  Bone density: 05/15/2009, at [place], normal, lowest T score -0.8  Lipid panel:  Allergies  Allergen Reactions  . Prednisone Shortness Of Breath    "jacks me up" jittery  . Codeine Nausea Only    Nausea   . Cortisone Swelling    "jack me up"    Current Outpatient Prescriptions  Medication Sig Dispense Refill  . amLODipine (NORVASC) 5 MG tablet       . clonazePAM (KLONOPIN) 0.5 MG tablet Take 0.5 mg by mouth 2 (two) times daily as needed for anxiety.      Marland Kitchen liothyronine (CYTOMEL) 5 MCG tablet       . ondansetron (ZOFRAN ODT) 4 MG disintegrating tablet Take 1 tablet (4 mg total) by mouth every 8 (eight) hours as needed for nausea or vomiting.  20 tablet  0  . oxyCODONE-acetaminophen (PERCOCET) 10-325 MG per tablet Take 1 tablet by mouth every 6 (six) hours as needed for pain.  30 tablet  0  . SYNTHROID 137 MCG tablet       . triamterene-hydrochlorothiazide (MAXZIDE-25) 37.5-25 MG per tablet        No current facility-administered medications for this visit.    OBJECTIVE: Middle-aged white woman who appears  uncomfortable  Filed Vitals:   03/07/14 1528  BP: 130/85  Pulse: 79  Temp: 97.8 F (36.6 C)  Resp: 18     Body mass index is 31.78 kg/(m^2).    ECOG FS:1 - Symptomatic but completely ambulatory  Ocular: Sclerae unicteric, EOMs intact  Ear-nose-throat: Oropharynx clear and moist Lymphatic: No cervical or supraclavicular adenopathy Lungs no rales or rhonchi, good excursion bilaterally Heart regular rate and rhythm, no murmur appreciated Abd soft,  obese, nontender, positive bowel sounds MSK no focal spinal tenderness, no  upper extremity lymphedema  Neuro: non-focal, well-oriented, appropriate affect Breasts: The right breast is unremarkable. The left breast is status post  recent lumpectomy. There is an area of swelling superiorly distorting the breast silhouette, likely a seroma.  otherwise the cosmetic result is good and the incisions are healing nicely, with no dehiscence, erythema, or unusual tenderness. The left axilla is benign.    LAB RESULTS:  CMP     Component Value Date/Time   NA 143 02/16/2014 1221   NA 140 03/13/2009 2325   K 3.5 02/16/2014 1221   K 3.7 03/13/2009 2325   CL 105 03/13/2009 2325   CO2 21* 02/16/2014 1221   CO2 29 03/13/2009 2325   GLUCOSE 152* 02/16/2014 1221   GLUCOSE 107* 03/13/2009 2325   GLUCOSE  88 09/22/2006 1056   BUN 15.3 02/16/2014 1221   BUN 12 03/13/2009 2325   CREATININE 0.8 02/16/2014 1221   CREATININE 0.76 03/13/2009 2325   CALCIUM 10.3 02/16/2014 1221   CALCIUM 9.4 03/13/2009 2325   PROT 7.8 02/16/2014 1221   PROT 7.1 03/13/2009 2325   ALBUMIN 4.4 02/16/2014 1221   ALBUMIN 4.1 03/13/2009 2325   AST 34 02/16/2014 1221   AST 136* 03/13/2009 2325   ALT 59* 02/16/2014 1221   ALT 303* 03/13/2009 2325   ALKPHOS 81 02/16/2014 1221   ALKPHOS 131* 03/13/2009 2325   BILITOT 0.34 02/16/2014 1221   BILITOT 0.5 03/13/2009 2325   GFRNONAA >60 03/13/2009 2325   GFRAA  Value: >60        The eGFR has been calculated using the MDRD equation. This calculation has not been validated in all clinical situations. eGFR's persistently <60 mL/min signify possible Chronic Kidney Disease. 03/13/2009 2325    I No results found for this basename: SPEP,  UPEP,   kappa and lambda light chains    Lab Results  Component Value Date   WBC 8.7 02/16/2014   NEUTROABS 5.5 02/16/2014   HGB 13.5 02/16/2014   HCT 40.5 02/16/2014   MCV 91.2 02/16/2014   PLT 388 02/16/2014      Chemistry      Component Value Date/Time   NA 143 02/16/2014  1221   NA 140 03/13/2009 2325   K 3.5 02/16/2014 1221   K 3.7 03/13/2009 2325   CL 105 03/13/2009 2325   CO2 21* 02/16/2014 1221   CO2 29 03/13/2009 2325   BUN 15.3 02/16/2014 1221   BUN 12 03/13/2009 2325   CREATININE 0.8 02/16/2014 1221   CREATININE 0.76 03/13/2009 2325      Component Value Date/Time   CALCIUM 10.3 02/16/2014 1221   CALCIUM 9.4 03/13/2009 2325   ALKPHOS 81 02/16/2014 1221   ALKPHOS 131* 03/13/2009 2325   AST 34 02/16/2014 1221   AST 136* 03/13/2009 2325   ALT 59* 02/16/2014 1221   ALT 303* 03/13/2009 2325   BILITOT 0.34 02/16/2014 1221   BILITOT 0.5 03/13/2009 2325       No results found for this basename: LABCA2    No components found with this basename: [id number]    No results found for this basename: INR,  in the last 168 hours  Urinalysis    Component Value Date/Time   COLORURINE YELLOW 03/13/2009 2251   APPEARANCEUR CLOUDY* 03/13/2009 2251   LABSPEC 1.018 03/13/2009 2251   PHURINE 5.5 03/13/2009 2251   GLUCOSEU NEGATIVE 03/13/2009 2251   HGBUR NEGATIVE 03/13/2009 2251   BILIRUBINUR NEGATIVE 03/13/2009 2251   KETONESUR NEGATIVE 03/13/2009 2251   PROTEINUR NEGATIVE 03/13/2009 2251   UROBILINOGEN 0.2 03/13/2009 2251   NITRITE NEGATIVE 03/13/2009 2251   LEUKOCYTESUR SMALL* 03/13/2009 2251    STUDIES: Nm Sentinel Node Inj-no Rpt (breast)  03/01/2014   CLINICAL DATA: left axillary sentinel node biopsy   Sulfur colloid was injected intradermally by the nuclear medicine  technologist for breast cancer sentinel node localization.    Dg Chest Port 1 View  03/01/2014   CLINICAL DATA:  Right Port-A-Cath placement  EXAM: PORTABLE CHEST - 1 VIEW  COMPARISON:  Chest x-ray of 02/27/2010  FINDINGS: The lungs are poorly aerated with basilar atelectasis. Therefore the questioned nodule medially in the right upper lower lobe near the apex on prior chest x-ray is not visible. Cardiomegaly is stable. A right-sided Port-A-Cath is present with the  tip overlying the lower SVC near the expected  SVC -RA junction. No bony abnormality is seen. A small amount of air is noted in the soft tissues overlying the left axilla and left breast.  IMPRESSION: 1. Right-sided Port-A-Cath tip overlies the lower SVC. 2. Poor inspiration with basilar atelectasis. 3. Cardiomegaly.   Electronically Signed   By: Ivar Drape M.D.   On: 03/01/2014 16:20   Dg Fluoro Guide Cv Line-no Report  03/01/2014   CLINICAL DATA: port cath insertion   FLOURO GUIDE CV LINE  Fluoroscopy was utilized by the requesting physician.  No radiographic  interpretation.     ASSESSMENT: 53 y.o. [place] woman status post left breast biopsy 02/08/2014 for a clinical T1c N0, stage IA invasive ductal carcinoma, grade 3, triple negative, with an MIB-1 of 50%.  (1) status post left lumpectomy and sentinel lymph node sampling 03/01/2014 for a pT1c pN0, stage IA invasive ductal carcinoma, grade 3, with negative margins, repeat prognostic panel pending.  (2) adjuvant chemotherapy to start a 03/25/2014 consisting of cyclophosphamide and doxorubicin given in dose dense fashion x4, with Neulasta support on day 2; to be followed by weekly Taxol either weekly or every 2 weeks, as the patient may choose at that time  (3) adjuvant radiation to follow chemotherapy  PLAN: We spent approximately 45 minutes discussing [place] situation. Basically, she has a small but dangerous breast cancer in the adjuvant! Protocol would quote her a 17% risk of dying from this tumor with local treatment only. This risk will drop to about 10% with adjuvant chemotherapy, which is the only form of systemic treatment available to her, since her cancer would not be expected to respond to anti-estrogens are anti-HER-2 treatment.  We prefer to start chemotherapy in triple negative case this within a month of surgery, so she will start of doxorubicin and cyclophosphamide on June 26. She will receive Neulasta on Saturday morning. Since she is off on Mondays usually this will  allow her to return to work on Tuesday, missing a minimum of workdays.  Today we discussed in detail the possible toxicities, side effects and complications of these agents. I wrote her the supportive medicine prescriptions and also gave her a "map" on how to take these medications appropriately. I suggested she bring this to her visit June 26 to review any questions with our physician's assistant, whom she will be seeing that day.  I wrote her a prescription for Vicodin, since she tells me she tolerates that much better than the oxycodone she was prescribed. I urged her to start a walking or other exercise program at this point. I put in her chemotherapy orders and all her appointments through July, which is when she will be seeing me again and specifically to discuss how she wishes to take the paclitaxel  K[place] has a good understanding of the overall plan. She agrees with it. She knows the goal of treatment in her case is cure. She will call with any problems that may develop before her next visit here.   Chauncey Cruel, MD   03/07/2014 3:57 PM

## 2014-03-08 ENCOUNTER — Telehealth: Payer: Self-pay | Admitting: *Deleted

## 2014-03-08 NOTE — Telephone Encounter (Signed)
Per staff message and POF I have scheduled appts.  JMW  

## 2014-03-08 NOTE — Addendum Note (Signed)
Addended by: Laureen Abrahams on: 03/08/2014 05:31 PM   Modules accepted: Medications

## 2014-03-09 ENCOUNTER — Telehealth: Payer: Self-pay | Admitting: Oncology

## 2014-03-09 NOTE — Telephone Encounter (Signed)
cld & spoke to adv pt to look at updated sch on MY CHART. Pt stated she would

## 2014-03-10 ENCOUNTER — Telehealth: Payer: Self-pay | Admitting: Oncology

## 2014-03-10 NOTE — Telephone Encounter (Signed)
cld & talkwd w/pt and gave appt imes & date-adv pt i would mail out copy of sch

## 2014-03-15 ENCOUNTER — Encounter (INDEPENDENT_AMBULATORY_CARE_PROVIDER_SITE_OTHER): Payer: Self-pay | Admitting: General Surgery

## 2014-03-15 ENCOUNTER — Ambulatory Visit (INDEPENDENT_AMBULATORY_CARE_PROVIDER_SITE_OTHER): Payer: 59 | Admitting: General Surgery

## 2014-03-15 VITALS — BP 124/76 | HR 68 | Temp 98.4°F | Ht 65.0 in | Wt 192.0 lb

## 2014-03-15 DIAGNOSIS — Z09 Encounter for follow-up examination after completed treatment for conditions other than malignant neoplasm: Secondary | ICD-10-CM

## 2014-03-15 NOTE — Progress Notes (Signed)
Subjective:     Patient ID: Alexandria Bradley, female   DOB: 1961-06-30, 53 y.o.   MRN: [id number]  HPI 53 yof s/p left lumpectomy/sn/port placement for stage I TNBC.  She has negative margins, node negative.  Returns today doing well except for pain/burning down inner aspect of left arm.  She is due to begin her chemotherapy on 26 June.  Review of Systems     Objective:   Physical Exam Healing left breast and axillary incisions without infection, healing port incision without infection    Assessment:     Stage I left breast triple negative tumor s/p lump/sn     Plan:     I think her burning pain will just get better with time. She is going to try local therapy and nsaids and give this time.  She is going to continue her exercises and we will get her to go to abc class hopefully.   i discussed her path and gave her a copy today.  She will begin chemo next week. I will see back after other therapies but she knows to call if arm pain not better or any other concerns.

## 2014-03-16 ENCOUNTER — Telehealth (INDEPENDENT_AMBULATORY_CARE_PROVIDER_SITE_OTHER): Payer: Self-pay | Admitting: General Surgery

## 2014-03-16 NOTE — Telephone Encounter (Signed)
Called patient and informed her that I would be mailing her a paper for the ABc class.  Informed her that this paper must be taken to the class with her.  She verbalized understanding of this information.

## 2014-03-23 ENCOUNTER — Other Ambulatory Visit: Payer: Self-pay | Admitting: Physician Assistant

## 2014-03-23 DIAGNOSIS — C50212 Malignant neoplasm of upper-inner quadrant of left female breast: Secondary | ICD-10-CM

## 2014-03-25 ENCOUNTER — Ambulatory Visit (HOSPITAL_BASED_OUTPATIENT_CLINIC_OR_DEPARTMENT_OTHER): Payer: 59

## 2014-03-25 ENCOUNTER — Other Ambulatory Visit (HOSPITAL_BASED_OUTPATIENT_CLINIC_OR_DEPARTMENT_OTHER): Payer: 59

## 2014-03-25 ENCOUNTER — Ambulatory Visit (HOSPITAL_BASED_OUTPATIENT_CLINIC_OR_DEPARTMENT_OTHER): Payer: 59 | Admitting: Hematology and Oncology

## 2014-03-25 VITALS — BP 142/86 | HR 80 | Temp 98.3°F | Resp 18 | Ht 65.0 in | Wt 193.6 lb

## 2014-03-25 DIAGNOSIS — C50219 Malignant neoplasm of upper-inner quadrant of unspecified female breast: Secondary | ICD-10-CM

## 2014-03-25 DIAGNOSIS — C50212 Malignant neoplasm of upper-inner quadrant of left female breast: Secondary | ICD-10-CM

## 2014-03-25 DIAGNOSIS — Z5111 Encounter for antineoplastic chemotherapy: Secondary | ICD-10-CM

## 2014-03-25 LAB — COMPREHENSIVE METABOLIC PANEL (CC13)
ALT: 70 U/L — ABNORMAL HIGH (ref 0–55)
ANION GAP: 11 meq/L (ref 3–11)
AST: 38 U/L — ABNORMAL HIGH (ref 5–34)
Albumin: 4.3 g/dL (ref 3.5–5.0)
Alkaline Phosphatase: 86 U/L (ref 40–150)
BUN: 14.8 mg/dL (ref 7.0–26.0)
CALCIUM: 10.2 mg/dL (ref 8.4–10.4)
CHLORIDE: 106 meq/L (ref 98–109)
CO2: 26 mEq/L (ref 22–29)
Creatinine: 0.8 mg/dL (ref 0.6–1.1)
GLUCOSE: 89 mg/dL (ref 70–140)
POTASSIUM: 3.5 meq/L (ref 3.5–5.1)
Sodium: 143 mEq/L (ref 136–145)
Total Bilirubin: 0.41 mg/dL (ref 0.20–1.20)
Total Protein: 7.8 g/dL (ref 6.4–8.3)

## 2014-03-25 LAB — CBC WITH DIFFERENTIAL/PLATELET
BASO%: 1.1 % (ref 0.0–2.0)
BASOS ABS: 0.1 10*3/uL (ref 0.0–0.1)
EOS ABS: 0.9 10*3/uL — AB (ref 0.0–0.5)
EOS%: 10.4 % — ABNORMAL HIGH (ref 0.0–7.0)
HCT: 40.1 % (ref 34.8–46.6)
HEMOGLOBIN: 13.4 g/dL (ref 11.6–15.9)
LYMPH#: 2.5 10*3/uL (ref 0.9–3.3)
LYMPH%: 28.2 % (ref 14.0–49.7)
MCH: 30.6 pg (ref 25.1–34.0)
MCHC: 33.4 g/dL (ref 31.5–36.0)
MCV: 91.7 fL (ref 79.5–101.0)
MONO#: 0.7 10*3/uL (ref 0.1–0.9)
MONO%: 8.2 % (ref 0.0–14.0)
NEUT%: 52.1 % (ref 38.4–76.8)
NEUTROS ABS: 4.7 10*3/uL (ref 1.5–6.5)
Platelets: 315 10*3/uL (ref 145–400)
RBC: 4.37 10*6/uL (ref 3.70–5.45)
RDW: 13.3 % (ref 11.2–14.5)
WBC: 9 10*3/uL (ref 3.9–10.3)

## 2014-03-25 MED ORDER — SODIUM CHLORIDE 0.9 % IV SOLN
150.0000 mg | Freq: Once | INTRAVENOUS | Status: AC
  Administered 2014-03-25: 150 mg via INTRAVENOUS
  Filled 2014-03-25: qty 5

## 2014-03-25 MED ORDER — LORAZEPAM 2 MG/ML IJ SOLN: 0.5000 mg | Freq: Once | INTRAMUSCULAR | Status: DC

## 2014-03-25 MED ORDER — SODIUM CHLORIDE 0.9 % IJ SOLN
10.0000 mL | INTRAMUSCULAR | Status: DC | PRN
  Administered 2014-03-25: 10 mL
  Filled 2014-03-25: qty 10

## 2014-03-25 MED ORDER — CYCLOPHOSPHAMIDE CHEMO INJECTION 1 GM
600.0000 mg/m2 | Freq: Once | INTRAMUSCULAR | Status: AC
  Administered 2014-03-25: 1200 mg via INTRAVENOUS
  Filled 2014-03-25: qty 60

## 2014-03-25 MED ORDER — PALONOSETRON HCL INJECTION 0.25 MG/5ML
0.2500 mg | Freq: Once | INTRAVENOUS | Status: AC
  Administered 2014-03-25: 0.25 mg via INTRAVENOUS

## 2014-03-25 MED ORDER — DOXORUBICIN HCL CHEMO IV INJECTION 2 MG/ML
60.0000 mg/m2 | Freq: Once | INTRAVENOUS | Status: AC
  Administered 2014-03-25: 120 mg via INTRAVENOUS
  Filled 2014-03-25: qty 60

## 2014-03-25 MED ORDER — HEPARIN SOD (PORK) LOCK FLUSH 100 UNIT/ML IV SOLN
500.0000 [IU] | Freq: Once | INTRAVENOUS | Status: AC | PRN
  Administered 2014-03-25: 500 [IU]
  Filled 2014-03-25: qty 5

## 2014-03-25 MED ORDER — SODIUM CHLORIDE 0.9 % IV SOLN
Freq: Once | INTRAVENOUS | Status: AC
  Administered 2014-03-25: 12:00:00 via INTRAVENOUS

## 2014-03-25 MED ORDER — DEXAMETHASONE SODIUM PHOSPHATE 20 MG/5ML IJ SOLN
12.0000 mg | Freq: Once | INTRAMUSCULAR | Status: AC
  Administered 2014-03-25: 12 mg via INTRAVENOUS

## 2014-03-25 NOTE — Patient Instructions (Signed)
[facility] Discharge Instructions for Patients Receiving Chemotherapy  Today you received the following chemotherapy agents Adriamycin/Cytoxan To help prevent nausea and vomiting after your treatment, we encourage you to take your nausea medication as prescribed.   If you develop nausea and vomiting that is not controlled by your nausea medication, call the clinic.   BELOW ARE SYMPTOMS THAT SHOULD BE REPORTED IMMEDIATELY:  *FEVER GREATER THAN 100.5 F  *CHILLS WITH OR WITHOUT FEVER  NAUSEA AND VOMITING THAT IS NOT CONTROLLED WITH YOUR NAUSEA MEDICATION  *UNUSUAL SHORTNESS OF BREATH  *UNUSUAL BRUISING OR BLEEDING  TENDERNESS IN MOUTH AND THROAT WITH OR WITHOUT PRESENCE OF ULCERS  *URINARY PROBLEMS  *BOWEL PROBLEMS  UNUSUAL RASH Items with * indicate a potential emergency and should be followed up as soon as possible.  Feel free to call the clinic you have any questions or concerns. The clinic phone number is [phone].    Doxorubicin injection (Adriamycin) What is this medicine? DOXORUBICIN (dox oh ROO bi sin) is a chemotherapy drug. It is used to treat many kinds of cancer like Hodgkin's disease, leukemia, non-Hodgkin's lymphoma, neuroblastoma, sarcoma, and Wilms' tumor. It is also used to treat bladder cancer, breast cancer, lung cancer, ovarian cancer, stomach cancer, and thyroid cancer. This medicine may be used for other purposes; ask your health care provider or pharmacist if you have questions. COMMON BRAND NAME(S): Adriamycin, Adriamycin PFS, Adriamycin RDF, Rubex What should I tell my health care provider before I take this medicine? They need to know if you have any of these conditions: -blood disorders -heart disease, recent heart attack -infection (especially a virus infection such as chickenpox, cold sores, or herpes) -irregular heartbeat -liver disease -recent or ongoing radiation therapy -an unusual or allergic reaction to doxorubicin,  other chemotherapy agents, other medicines, foods, dyes, or preservatives -pregnant or trying to get pregnant -breast-feeding How should I use this medicine? This drug is given as an infusion into a vein. It is administered in a hospital or clinic by a specially trained health care professional. If you have pain, swelling, burning or any unusual feeling around the site of your injection, tell your health care professional right away. Talk to your pediatrician regarding the use of this medicine in children. Special care may be needed. Overdosage: If you think you have taken too much of this medicine contact a poison control center or emergency room at once. NOTE: This medicine is only for you. Do not share this medicine with others. What if I miss a dose? It is important not to miss your dose. Call your doctor or health care professional if you are unable to keep an appointment. What may interact with this medicine? Do not take this medicine with any of the following medications: -cisapride -droperidol -halofantrine -pimozide -zidovudine This medicine may also interact with the following medications: -chloroquine -chlorpromazine -clarithromycin -cyclophosphamide -cyclosporine -erythromycin -medicines for depression, anxiety, or psychotic disturbances -medicines for irregular heart beat like amiodarone, bepridil, dofetilide, encainide, flecainide, propafenone, quinidine -medicines for seizures like ethotoin, fosphenytoin, phenytoin -medicines for nausea, vomiting like dolasetron, ondansetron, palonosetron -medicines to increase blood counts like filgrastim, pegfilgrastim, sargramostim -methadone -methotrexate -pentamidine -progesterone -vaccines -verapamil Talk to your doctor or health care professional before taking any of these medicines: -acetaminophen -aspirin -ibuprofen -ketoprofen -naproxen This list may not describe all possible interactions. Give your health care  provider a list of all the medicines, herbs, non-prescription drugs, or dietary supplements you use. Also tell them if you smoke, drink alcohol, or  use illegal drugs. Some items may interact with your medicine. What should I watch for while using this medicine? Your condition will be monitored carefully while you are receiving this medicine. You will need important blood work done while you are taking this medicine. This drug may make you feel generally unwell. This is not uncommon, as chemotherapy can affect healthy cells as well as cancer cells. Report any side effects. Continue your course of treatment even though you feel ill unless your doctor tells you to stop. Your urine may turn red for a few days after your dose. This is not blood. If your urine is dark or brown, call your doctor. In some cases, you may be given additional medicines to help with side effects. Follow all directions for their use. Call your doctor or health care professional for advice if you get a fever, chills or sore throat, or other symptoms of a cold or flu. Do not treat yourself. This drug decreases your body's ability to fight infections. Try to avoid being around people who are sick. This medicine may increase your risk to bruise or bleed. Call your doctor or health care professional if you notice any unusual bleeding. Be careful brushing and flossing your teeth or using a toothpick because you may get an infection or bleed more easily. If you have any dental work done, tell your dentist you are receiving this medicine. Avoid taking products that contain aspirin, acetaminophen, ibuprofen, naproxen, or ketoprofen unless instructed by your doctor. These medicines may hide a fever. Men and women of childbearing age should use effective birth control methods while using taking this medicine. Do not become pregnant while taking this medicine. There is a potential for serious side effects to an unborn child. Talk to your health  care professional or pharmacist for more information. Do not breast-feed an infant while taking this medicine. Do not let others touch your urine or other body fluids for 5 days after each treatment with this medicine. Caregivers should wear latex gloves to avoid touching body fluids during this time. There is a maximum amount of this medicine you should receive throughout your life. The amount depends on the medical condition being treated and your overall health. Your doctor will watch how much of this medicine you receive in your lifetime. Tell your doctor if you have taken this medicine before. What side effects may I notice from receiving this medicine? Side effects that you should report to your doctor or health care professional as soon as possible: -allergic reactions like skin rash, itching or hives, swelling of the face, lips, or tongue -low blood counts - this medicine may decrease the number of white blood cells, red blood cells and platelets. You may be at increased risk for infections and bleeding. -signs of infection - fever or chills, cough, sore throat, pain or difficulty passing urine -signs of decreased platelets or bleeding - bruising, pinpoint red spots on the skin, black, tarry stools, blood in the urine -signs of decreased red blood cells - unusually weak or tired, fainting spells, lightheadedness -breathing problems -chest pain -fast, irregular heartbeat -mouth sores -nausea, vomiting -pain, swelling, redness at site where injected -pain, tingling, numbness in the hands or feet -swelling of ankles, feet, or hands -unusual bleeding or bruising Side effects that usually do not require medical attention (report to your doctor or health care professional if they continue or are bothersome): -diarrhea -facial flushing -hair loss -loss of appetite -missed menstrual periods -nail discoloration or damage -  red or watery eyes -red colored urine -stomach upset This list may  not describe all possible side effects. Call your doctor for medical advice about side effects. You may report side effects to FDA at 1-800-FDA-1088. Where should I keep my medicine? This drug is given in a hospital or clinic and will not be stored at home. NOTE: This sheet is a summary. It may not cover all possible information. If you have questions about this medicine, talk to your doctor, pharmacist, or health care provider.  2015, [employer]. (2013-01-12 09:54:34)   Cyclophosphamide injection (Cytoxan) What is this medicine? CYCLOPHOSPHAMIDE (sye kloe FOSS fa mide) is a chemotherapy drug. It slows the growth of cancer cells. This medicine is used to treat many types of cancer like lymphoma, myeloma, leukemia, breast cancer, and ovarian cancer, to name a few. This medicine may be used for other purposes; ask your health care provider or pharmacist if you have questions. COMMON BRAND NAME(S): Cytoxan, Neosar What should I tell my health care provider before I take this medicine? They need to know if you have any of these conditions: -blood disorders -history of other chemotherapy -infection -kidney disease -liver disease -recent or ongoing radiation therapy -tumors in the bone marrow -an unusual or allergic reaction to cyclophosphamide, other chemotherapy, other medicines, foods, dyes, or preservatives -pregnant or trying to get pregnant -breast-feeding How should I use this medicine? This drug is usually given as an injection into a vein or muscle or by infusion into a vein. It is administered in a hospital or clinic by a specially trained health care professional. Talk to your pediatrician regarding the use of this medicine in children. Special care may be needed. Overdosage: If you think you have taken too much of this medicine contact a poison control center or emergency room at once. NOTE: This medicine is only for you. Do not share this medicine with others. What if  I miss a dose? It is important not to miss your dose. Call your doctor or health care professional if you are unable to keep an appointment. What may interact with this medicine? This medicine may interact with the following medications: -amiodarone -amphotericin B -azathioprine -certain antiviral medicines for HIV or AIDS such as protease inhibitors (e.g., indinavir, ritonavir) and zidovudine -certain blood pressure medications such as benazepril, captopril, enalapril, fosinopril, lisinopril, moexipril, monopril, perindopril, quinapril, ramipril, trandolapril -certain cancer medications such as anthracyclines (e.g., daunorubicin, doxorubicin), busulfan, cytarabine, paclitaxel, pentostatin, tamoxifen, trastuzumab -certain diuretics such as chlorothiazide, chlorthalidone, hydrochlorothiazide, indapamide, metolazone -certain medicines that treat or prevent blood clots like warfarin -certain muscle relaxants such as succinylcholine -cyclosporine -etanercept -indomethacin -medicines to increase blood counts like filgrastim, pegfilgrastim, sargramostim -medicines used as general anesthesia -metronidazole -natalizumab This list may not describe all possible interactions. Give your health care provider a list of all the medicines, herbs, non-prescription drugs, or dietary supplements you use. Also tell them if you smoke, drink alcohol, or use illegal drugs. Some items may interact with your medicine. What should I watch for while using this medicine? Visit your doctor for checks on your progress. This drug may make you feel generally unwell. This is not uncommon, as chemotherapy can affect healthy cells as well as cancer cells. Report any side effects. Continue your course of treatment even though you feel ill unless your doctor tells you to stop. Drink water or other fluids as directed. Urinate often, even at night. In some cases, you may be given additional medicines to help with side effects.  Follow all directions for their use. Call your doctor or health care professional for advice if you get a fever, chills or sore throat, or other symptoms of a cold or flu. Do not treat yourself. This drug decreases your body's ability to fight infections. Try to avoid being around people who are sick. This medicine may increase your risk to bruise or bleed. Call your doctor or health care professional if you notice any unusual bleeding. Be careful brushing and flossing your teeth or using a toothpick because you may get an infection or bleed more easily. If you have any dental work done, tell your dentist you are receiving this medicine. You may get drowsy or dizzy. Do not drive, use machinery, or do anything that needs mental alertness until you know how this medicine affects you. Do not become pregnant while taking this medicine or for 1 year after stopping it. Women should inform their doctor if they wish to become pregnant or think they might be pregnant. Men should not father a child while taking this medicine and for 4 months after stopping it. There is a potential for serious side effects to an unborn child. Talk to your health care professional or pharmacist for more information. Do not breast-feed an infant while taking this medicine. This medicine may interfere with the ability to have a child. This medicine has caused ovarian failure in some women. This medicine has caused reduced sperm counts in some men. You should talk with your doctor or health care professional if you are concerned about your fertility. If you are going to have surgery, tell your doctor or health care professional that you have taken this medicine. What side effects may I notice from receiving this medicine? Side effects that you should report to your doctor or health care professional as soon as possible: -allergic reactions like skin rash, itching or hives, swelling of the face, lips, or tongue -low blood counts - this  medicine may decrease the number of white blood cells, red blood cells and platelets. You may be at increased risk for infections and bleeding. -signs of infection - fever or chills, cough, sore throat, pain or difficulty passing urine -signs of decreased platelets or bleeding - bruising, pinpoint red spots on the skin, black, tarry stools, blood in the urine -signs of decreased red blood cells - unusually weak or tired, fainting spells, lightheadedness -breathing problems -dark urine -dizziness -palpitations -swelling of the ankles, feet, hands -trouble passing urine or change in the amount of urine -weight gain -yellowing of the eyes or skin Side effects that usually do not require medical attention (report to your doctor or health care professional if they continue or are bothersome): -changes in nail or skin color -hair loss -missed menstrual periods -mouth sores -nausea, vomiting This list may not describe all possible side effects. Call your doctor for medical advice about side effects. You may report side effects to FDA at 1-800-FDA-1088. Where should I keep my medicine? This drug is given in a hospital or clinic and will not be stored at home. NOTE: This sheet is a summary. It may not cover all possible information. If you have questions about this medicine, talk to your doctor, pharmacist, or health care provider.  2015, [employer]. (2012-07-31 16:22:58)

## 2014-03-25 NOTE — Progress Notes (Signed)
[place]  [phone]) [phone] Fax:(336) [phone]     ID: Alexandria Bradley OB: May 22, 1961  MR#: [id number]  LKG#:[id number]  PCP: Lovenia Kim, MD GYN:   SU: [name] OTHER MD: [name], Delrae Rend, Wilford Corner  CHIEF COMPLAINT: Triple negative breast cancer  CURRENT TREATMENT: Adjuvant chemotherapy  BREAST CANCER HISTORY: From the original intake note of 02/16/2014:  Alexandria Bradley palpated a mass in her left breast early May and brought it immediately to her gynecologist attention. He set her up for bilateral diagnostic mammography and left ultrasonography at [facility] 02/07/2014. Mammography showed a 1.3 cm oval mass with spiculated margins in the left breast at the 11:00 position. This was palpable. Ultrasound showed a 1.1 cm tall her than wide mass with a microlobulated margins. He was hypoechoic. There were no abnormalities noted in the left axilla.  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 Lyndon Code at conference 02/16/2014), triple negative, with an MIB-1 of 50%.  The patient's subsequent history is as detailed below  INTERVAL HISTORY: Alexandria Bradley  returns today for followup of her breast cancer and for initiation of first cycle of chemotherapy with dose dense AC. She is seems to be in anxiety about the chemotherapy. She says she is doing well without any shortness of breath, chest pain, palpitations, blood in the stool or blood in the urine. She denies any fever, chills, dizziness headaches or blurred vision . She already has nausea and vomiting, pain medications prescribed by Dr. Jana Hakim during the last visit. Her surgical site pain is much better. Her appetite is good   REVIEW OF SYSTEMS: A detailed 10 point review of systems is been assessed and pertinent symptoms as mentioned in interval history    PAST MEDICAL  HISTORY: Past Medical History  Diagnosis Date  . Thyroid disease   . Osteoporosis   . Psoriasis   . Breast cancer   . Arthritis   . Back pain   . Hypothyroidism   . Anxiety   . Breast cancer     left    PAST SURGICAL HISTORY: Past Surgical History  Procedure Laterality Date  . Cholecystectomy    . Tonsillectomy    . Tubal ligation    . Myomectomy    . Lipoma excision    . Portacath placement N/A 03/01/2014    Procedure: INSERTION PORT-A-CATH;  Surgeon: [name], MD;  Location: [facility];  Service: General;  Laterality: N/A;    FAMILY HISTORY Family History  Problem Relation Age of Onset  . Endometrial cancer Mother   . Lung cancer Father   . Brain cancer Father    the patient's father died at the age of 66 from what may have been metastatic lung cancer. The patient knows very little about the father's side of her family. Her mother is alive at age 43. She was recently diagnosed with endometrial cancer. The patient has one brother, 2 sisters. There is no history of breast or ovarian cancer in the family.  GYNECOLOGIC HISTORY:  Menarche age 80, first live birth age 57. The patient is GX P3. One child died shortly after birth. She stopped having periods approximately 2005. She did not use hormone replacement. 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 Her husband Alexandria Bradley is a Ecologist  Chief Financial Officer. Son Alexandria Bradley is that she Nurse, adult in [place]. Daughter Alexandria Bradley lives in [place] and works for [employer] as an Therapist, sports.    ADVANCED DIRECTIVES: Not in place   HEALTH MAINTENANCE: History  Substance Use Topics  . Smoking status: Former Research scientist (life sciences)  . Smokeless tobacco: Not on file  . Alcohol Use: Yes     Comment: social     Colonoscopy: 2010  PAP: 2013  Bone density: 05/15/2009, at [facility], normal, lowest T score -0.8  Lipid  panel:  Allergies  Allergen Reactions  . Prednisone Shortness Of Breath    "jacks me up" jittery  . Codeine Nausea Only    Nausea   . Cortisone Swelling    "jack me up"    Current Outpatient Prescriptions  Medication Sig Dispense Refill  . amLODipine (NORVASC) 5 MG tablet       . clonazePAM (KLONOPIN) 0.5 MG tablet Take 0.5 mg by mouth 2 (two) times daily as needed for anxiety.      Marland Kitchen dexamethasone (DECADRON) 4 MG tablet Take 2 tablets by mouth once a day on the day after chemotherapy and then take 2 tablets two times a day for 2 days. Take with food.  30 tablet  1  . lidocaine-prilocaine (EMLA) cream Apply 1 application topically as needed. Apply numbing cream over port site 1-2 hours before chemo and cover with plastic wrap  30 g  0  . liothyronine (CYTOMEL) 5 MCG tablet       . LORazepam (ATIVAN) 0.5 MG tablet Take 1 tablet (0.5 mg total) by mouth at bedtime as needed (Nausea or vomiting).  30 tablet  0  . ondansetron (ZOFRAN ODT) 4 MG disintegrating tablet Take 1 tablet (4 mg total) by mouth every 8 (eight) hours as needed for nausea or vomiting.  20 tablet  0  . ondansetron (ZOFRAN) 8 MG tablet Take 1 tablet (8 mg total) by mouth 2 (two) times daily as needed. Start on the third day after chemotherapy.  30 tablet  1  . prochlorperazine (COMPAZINE) 10 MG tablet Take 1 tablet (10 mg total) by mouth every 6 (six) hours as needed (Nausea or vomiting).  30 tablet  1  . SYNTHROID 137 MCG tablet       . triamterene-hydrochlorothiazide (MAXZIDE-25) 37.5-25 MG per tablet       . HYDROcodone-acetaminophen (HYCET) 7.5-325 mg/15 ml solution Take 10 mLs by mouth 4 (four) times daily as needed for moderate pain.  120 mL  0   No current facility-administered medications for this visit.   Facility-Administered Medications Ordered in Other Visits  Medication Dose Route Frequency Provider Last Rate Last Dose  . 0.9 %  sodium chloride infusion   Intravenous Once Chauncey Cruel, MD      .  cyclophosphamide (CYTOXAN) 1,200 mg in sodium chloride 0.9 % 250 mL chemo infusion  600 mg/m2 (Treatment Plan Actual) Intravenous Once Chauncey Cruel, MD      . Dexamethasone Sodium Phosphate (DECADRON) injection 12 mg  12 mg Intravenous Once Chauncey Cruel, MD      . DOXOrubicin (ADRIAMYCIN) chemo injection 120 mg  60 mg/m2 (Treatment Plan Actual) Intravenous Once Chauncey Cruel, MD      . fosaprepitant (EMEND) 150 mg in sodium chloride 0.9 % 145 mL IVPB  150 mg Intravenous Once Chauncey Cruel, MD      . heparin lock flush 100 unit/mL  500 Units Intracatheter Once PRN Chauncey Cruel, MD      .  palonosetron (ALOXI) injection 0.25 mg  0.25 mg Intravenous Once Chauncey Cruel, MD      . sodium chloride 0.9 % injection 10 mL  10 mL Intracatheter PRN Chauncey Cruel, MD        OBJECTIVE: Middle-aged white woman well based and well nourished not in acute distress  Filed Vitals:   03/25/14 1119  BP: 142/86  Pulse: 80  Temp: 98.3 F (36.8 C)  Resp: 18     Body mass index is 32.22 kg/(m^2).    ECOG FS:1 - Symptomatic but completely ambulatory  HEENT PERRLA, sclerae anicteric, conjunctival no pallor, neck supple, no JVD, no thyromegaly Ear-nose-throat: Oropharynx clear and moist Lymphatic: No cervical or supraclavicular adenopathy Lungs no rales or rhonchi, good excursion bilaterally Heart regular rate and rhythm, no murmur appreciated Abd soft, obese, nontender, positive bowel sounds MSK no focal spinal tenderness, no  upper extremity lymphedema  Neuro: non-focal, well-oriented, appropriate affect Breasts: The right breast is unremarkable. The left breast is status post  recent lumpectomy. There is an area of swelling superiorly distorting the breast silhouette, likely a seroma.  otherwise the cosmetic result is good and the incisions are healing nicely, with no dehiscence, erythema, or unusual tenderness. The left axilla is benign.    LAB RESULTS:  CMP     Component  Value Date/Time   NA 143 03/25/2014 1109   NA 140 03/13/2009 2325   K 3.5 03/25/2014 1109   K 3.7 03/13/2009 2325   CL 105 03/13/2009 2325   CO2 26 03/25/2014 1109   CO2 29 03/13/2009 2325   GLUCOSE 89 03/25/2014 1109   GLUCOSE 107* 03/13/2009 2325   GLUCOSE 88 09/22/2006 1056   BUN 14.8 03/25/2014 1109   BUN 12 03/13/2009 2325   CREATININE 0.8 03/25/2014 1109   CREATININE 0.76 03/13/2009 2325   CALCIUM 10.2 03/25/2014 1109   CALCIUM 9.4 03/13/2009 2325   PROT 7.8 03/25/2014 1109   PROT 7.1 03/13/2009 2325   ALBUMIN 4.3 03/25/2014 1109   ALBUMIN 4.1 03/13/2009 2325   AST 38* 03/25/2014 1109   AST 136* 03/13/2009 2325   ALT 70* 03/25/2014 1109   ALT 303* 03/13/2009 2325   ALKPHOS 86 03/25/2014 1109   ALKPHOS 131* 03/13/2009 2325   BILITOT 0.41 03/25/2014 1109   BILITOT 0.5 03/13/2009 2325   GFRNONAA >60 03/13/2009 2325   GFRAA  Value: >60        The eGFR has been calculated using the MDRD equation. This calculation has not been validated in all clinical situations. eGFR's persistently <60 mL/min signify possible Chronic Kidney Disease. 03/13/2009 2325    I No results found for this basename: SPEP,  UPEP,   kappa and lambda light chains    Lab Results  Component Value Date   WBC 9.0 03/25/2014   NEUTROABS 4.7 03/25/2014   HGB 13.4 03/25/2014   HCT 40.1 03/25/2014   MCV 91.7 03/25/2014   PLT 315 03/25/2014      Chemistry      Component Value Date/Time   NA 143 03/25/2014 1109   NA 140 03/13/2009 2325   K 3.5 03/25/2014 1109   K 3.7 03/13/2009 2325   CL 105 03/13/2009 2325   CO2 26 03/25/2014 1109   CO2 29 03/13/2009 2325   BUN 14.8 03/25/2014 1109   BUN 12 03/13/2009 2325   CREATININE 0.8 03/25/2014 1109   CREATININE 0.76 03/13/2009 2325      Component Value Date/Time   CALCIUM 10.2 03/25/2014  1109   CALCIUM 9.4 03/13/2009 2325   ALKPHOS 86 03/25/2014 1109   ALKPHOS 131* 03/13/2009 2325   AST 38* 03/25/2014 1109   AST 136* 03/13/2009 2325   ALT 70* 03/25/2014 1109   ALT 303* 03/13/2009 2325   BILITOT  0.41 03/25/2014 1109   BILITOT 0.5 03/13/2009 2325       No results found for this basename: LABCA2    No components found with this basename: [id number]    No results found for this basename: INR,  in the last 168 hours  Urinalysis    Component Value Date/Time   COLORURINE YELLOW 03/13/2009 2251   APPEARANCEUR CLOUDY* 03/13/2009 2251   LABSPEC 1.018 03/13/2009 2251   PHURINE 5.5 03/13/2009 2251   GLUCOSEU NEGATIVE 03/13/2009 2251   HGBUR NEGATIVE 03/13/2009 2251   BILIRUBINUR NEGATIVE 03/13/2009 2251   KETONESUR NEGATIVE 03/13/2009 2251   PROTEINUR NEGATIVE 03/13/2009 2251   UROBILINOGEN 0.2 03/13/2009 2251   NITRITE NEGATIVE 03/13/2009 2251   LEUKOCYTESUR SMALL* 03/13/2009 2251    STUDIES: Nm Sentinel Node Inj-no Rpt (breast)  03/01/2014   CLINICAL DATA: left axillary sentinel node biopsy   Sulfur colloid was injected intradermally by the nuclear medicine  technologist for breast cancer sentinel node localization.    Dg Chest Port 1 View  03/01/2014   CLINICAL DATA:  Right Port-A-Cath placement  EXAM: PORTABLE CHEST - 1 VIEW  COMPARISON:  Chest x-ray of 02/27/2010  FINDINGS: The lungs are poorly aerated with basilar atelectasis. Therefore the questioned nodule medially in the right upper lower lobe near the apex on prior chest x-ray is not visible. Cardiomegaly is stable. A right-sided Port-A-Cath is present with the tip overlying the lower SVC near the expected SVC -RA junction. No bony abnormality is seen. A small amount of air is noted in the soft tissues overlying the left axilla and left breast.  IMPRESSION: 1. Right-sided Port-A-Cath tip overlies the lower SVC. 2. Poor inspiration with basilar atelectasis. 3. Cardiomegaly.   Electronically Signed   By: Ivar Drape M.D.   On: 03/01/2014 16:20   Dg Fluoro Guide Cv Line-no Report  03/01/2014   CLINICAL DATA: port cath insertion   FLOURO GUIDE CV LINE  Fluoroscopy was utilized by the requesting physician.  No radiographic  interpretation.      ASSESSMENT: 53 y.o. [place] woman status post left breast biopsy 02/08/2014 for a clinical T1c N0, stage IA invasive ductal carcinoma, grade 3, triple negative, with an MIB-1 of 50%.  (1) status post left lumpectomy and sentinel lymph node sampling 03/01/2014 for a pT1c pN0, stage IA invasive ductal carcinoma, grade 3, with negative margins, repeat prognostic panel pending.  (2) adjuvant chemotherapy to start a 03/25/2014 consisting of cyclophosphamide and doxorubicin given in dose dense fashion x4, with Neulasta support on day 2; to be followed by weekly Taxol either weekly or every 2 weeks, as the patient may choose at that time  (3) adjuvant radiation to follow chemotherapy  (4) During the last visit she had a lengthy discussion about the importance chemotherapy with Dr. Jana Hakim.  " she has a small but dangerous breast cancer in the adjuvant! Protocol would quote her a 17% risk of dying from this tumor with local treatment only. This risk will drop to about 10% with adjuvant chemotherapy, which is the only form of systemic treatment available to her, since her cancer would not be expected to respond to anti-estrogens or anti-HER-2 treatment"  PLAN: Alexandria Bradley is ready to start her first  cycle of chemotherapy with dose dense AC today. I have once again reiterated the benefits and side effects of chemotherapy. She feels slightly anxietic about the chemotherapy. We'll  Give IV 0.5 mg of  Ativan today along with her premedications. Leta  has a good understanding of all the nausea, vomiting medications and pain medications  prescribed during the last visit.  CBC and CMP are acceptable for  chemotherapy. We'll start cycle #1 chemotherapy with dose dense Doxorubicin and Cyclophosphamide today followed by Neulasta injection tomorrow.   Next followup visit in 1 week with CBC differential and CMP and to assess for chemotoxicities   Alexandria Bradley has a good understanding of overall plan. She agrees  with it. She knows the goal of treatment in her case is cure. She will call with any problems that may develop before her next visit here.  Total time spent: 30 minutes  Wilmon Arms, MD   Medical oncology  03/25/2014 12:08 PM

## 2014-03-26 ENCOUNTER — Ambulatory Visit (HOSPITAL_BASED_OUTPATIENT_CLINIC_OR_DEPARTMENT_OTHER): Payer: 59

## 2014-03-26 VITALS — BP 134/83 | HR 81 | Temp 97.1°F | Resp 18

## 2014-03-26 DIAGNOSIS — Z5189 Encounter for other specified aftercare: Secondary | ICD-10-CM

## 2014-03-26 DIAGNOSIS — C50219 Malignant neoplasm of upper-inner quadrant of unspecified female breast: Secondary | ICD-10-CM

## 2014-03-26 MED ORDER — PEGFILGRASTIM INJECTION 6 MG/0.6ML
6.0000 mg | Freq: Once | SUBCUTANEOUS | Status: AC
  Administered 2014-03-26: 6 mg via SUBCUTANEOUS

## 2014-03-26 NOTE — Patient Instructions (Signed)

## 2014-04-04 ENCOUNTER — Encounter: Payer: Self-pay | Admitting: Oncology

## 2014-04-04 ENCOUNTER — Other Ambulatory Visit (HOSPITAL_BASED_OUTPATIENT_CLINIC_OR_DEPARTMENT_OTHER): Payer: 59

## 2014-04-04 ENCOUNTER — Ambulatory Visit (HOSPITAL_BASED_OUTPATIENT_CLINIC_OR_DEPARTMENT_OTHER): Payer: 59 | Admitting: Oncology

## 2014-04-04 VITALS — BP 134/78 | HR 78 | Temp 97.0°F | Resp 18 | Ht 65.0 in | Wt 193.0 lb

## 2014-04-04 DIAGNOSIS — K5900 Constipation, unspecified: Secondary | ICD-10-CM

## 2014-04-04 DIAGNOSIS — Z171 Estrogen receptor negative status [ER-]: Secondary | ICD-10-CM

## 2014-04-04 DIAGNOSIS — C50212 Malignant neoplasm of upper-inner quadrant of left female breast: Secondary | ICD-10-CM

## 2014-04-04 DIAGNOSIS — C50219 Malignant neoplasm of upper-inner quadrant of unspecified female breast: Secondary | ICD-10-CM

## 2014-04-04 LAB — COMPREHENSIVE METABOLIC PANEL (CC13)
ALT: 43 U/L (ref 0–55)
ANION GAP: 10 meq/L (ref 3–11)
AST: 22 U/L (ref 5–34)
Albumin: 3.7 g/dL (ref 3.5–5.0)
Alkaline Phosphatase: 72 U/L (ref 40–150)
BUN: 9.2 mg/dL (ref 7.0–26.0)
CO2: 26 mEq/L (ref 22–29)
CREATININE: 0.8 mg/dL (ref 0.6–1.1)
Calcium: 9.8 mg/dL (ref 8.4–10.4)
Chloride: 103 mEq/L (ref 98–109)
Glucose: 151 mg/dl — ABNORMAL HIGH (ref 70–140)
Potassium: 3.9 mEq/L (ref 3.5–5.1)
Sodium: 139 mEq/L (ref 136–145)
Total Protein: 6.8 g/dL (ref 6.4–8.3)

## 2014-04-04 LAB — CBC WITH DIFFERENTIAL/PLATELET
BASO%: 1 % (ref 0.0–2.0)
Basophils Absolute: 0 10*3/uL (ref 0.0–0.1)
EOS%: 6.3 % (ref 0.0–7.0)
Eosinophils Absolute: 0.3 10*3/uL (ref 0.0–0.5)
HEMATOCRIT: 36.3 % (ref 34.8–46.6)
HGB: 11.6 g/dL (ref 11.6–15.9)
LYMPH%: 30.4 % (ref 14.0–49.7)
MCH: 30.2 pg (ref 25.1–34.0)
MCHC: 32 g/dL (ref 31.5–36.0)
MCV: 94.5 fL (ref 79.5–101.0)
MONO#: 0.3 10*3/uL (ref 0.1–0.9)
MONO%: 7.8 % (ref 0.0–14.0)
NEUT#: 2.2 10*3/uL (ref 1.5–6.5)
NEUT%: 54.5 % (ref 38.4–76.8)
Platelets: 211 10*3/uL (ref 145–400)
RBC: 3.84 10*6/uL (ref 3.70–5.45)
RDW: 13.1 % (ref 11.2–14.5)
WBC: 4 10*3/uL (ref 3.9–10.3)
lymph#: 1.2 10*3/uL (ref 0.9–3.3)

## 2014-04-04 NOTE — Progress Notes (Signed)
[place]  Telephone:([phone]) [phone] Fax:(336) [phone]     ID: Alexandria Bradley OB: Sep 11, 1961  MR#: [id number]  FUX#:[id number]  PCP: Lovenia Kim, MD GYN:   SU: [name] OTHER MD: [name], Delrae Rend, Wilford Corner  CHIEF COMPLAINT: Triple negative breast cancer  CURRENT TREATMENT: Adjuvant chemotherapy  BREAST CANCER HISTORY: From the original intake note of 02/16/2014:  Alexandria Bradley palpated a mass in her left breast early May and brought it immediately to her gynecologist attention. He set her up for bilateral diagnostic mammography and left ultrasonography at [facility] 02/07/2014. Mammography showed a 1.3 cm oval mass with spiculated margins in the left breast at the 11:00 position. This was palpable. Ultrasound showed a 1.1 cm tall her than wide mass with a microlobulated margins. He was hypoechoic. There were no abnormalities noted in the left axilla.  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 Lyndon Code at conference 02/16/2014), triple negative, with an MIB-1 of 50%.  The patient's subsequent history is as detailed below  INTERVAL HISTORY: Alexandria Bradley  returns today for followup of her breast cancer. She received her first cycle of dose dense AC on 03/28/14. She had nausea without vomiting. Stated that her stomach hurt for several days, but this has now resolved. She developed constipation and took a number of agents including laxatives, MiraLax, and Metamucil. Bowels are not moving without difficulty. She denies any shortness of breath, chest pain, palpitations, blood in the stool or blood in the urine. She denies any fever, chills, dizziness headaches or blurred vision   REVIEW OF SYSTEMS: A detailed 10 point review of systems is been assessed and pertinent symptoms as mentioned in interval history    PAST MEDICAL HISTORY: Past  Medical History  Diagnosis Date  . Thyroid disease   . Osteoporosis   . Psoriasis   . Breast cancer   . Arthritis   . Back pain   . Hypothyroidism   . Anxiety   . Breast cancer     left    PAST SURGICAL HISTORY: Past Surgical History  Procedure Laterality Date  . Cholecystectomy    . Tonsillectomy    . Tubal ligation    . Myomectomy    . Lipoma excision    . Portacath placement N/A 03/01/2014    Procedure: INSERTION PORT-A-CATH;  Surgeon: [name], MD;  Location: [place];  Service: General;  Laterality: N/A;    FAMILY HISTORY Family History  Problem Relation Age of Onset  . Endometrial cancer Mother   . Lung cancer Father   . Brain cancer Father    the patient's father died at the age of 32 from what may have been metastatic lung cancer. The patient knows very little about the father's side of her family. Her mother is alive at age 79. She was recently diagnosed with endometrial cancer. The patient has one brother, 2 sisters. There is no history of breast or ovarian cancer in the family.  GYNECOLOGIC HISTORY:  Menarche age 41, first live birth age 68. The patient is GX P3. One child died shortly after birth. She stopped having periods approximately 2005. She did not use hormone replacement. 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 Her husband Alexandria Bradley is a Insurance underwriter. Son Alexandria Bradley is that she  Nurse, adult in [place]. Daughter Alexandria Bradley lives in [place] and works for [employer] as an Therapist, sports.    ADVANCED DIRECTIVES: Not in place   HEALTH MAINTENANCE: History  Substance Use Topics  . Smoking status: Former Research scientist (life sciences)  . Smokeless tobacco: Not on file  . Alcohol Use: Yes     Comment: social     Colonoscopy: 2010  PAP: 2013  Bone density: 05/15/2009, at [facility], normal, lowest T score -0.8  Lipid  panel:  Allergies  Allergen Reactions  . Prednisone Shortness Of Breath    "jacks me up" jittery  . Codeine Nausea Only    Nausea   . Cortisone Swelling    "jack me up"    Current Outpatient Prescriptions  Medication Sig Dispense Refill  . amLODipine (NORVASC) 5 MG tablet       . clonazePAM (KLONOPIN) 0.5 MG tablet Take 0.5 mg by mouth 2 (two) times daily as needed for anxiety.      Marland Kitchen dexamethasone (DECADRON) 4 MG tablet Take 2 tablets by mouth once a day on the day after chemotherapy and then take 2 tablets two times a day for 2 days. Take with food.  30 tablet  1  . HYDROcodone-acetaminophen (HYCET) 7.5-325 mg/15 ml solution Take 10 mLs by mouth 4 (four) times daily as needed for moderate pain.  120 mL  0  . lidocaine-prilocaine (EMLA) cream Apply 1 application topically as needed. Apply numbing cream over port site 1-2 hours before chemo and cover with plastic wrap  30 g  0  . liothyronine (CYTOMEL) 5 MCG tablet       . loratadine (CLARITIN) 10 MG tablet Take 10 mg by mouth daily.      Marland Kitchen LORazepam (ATIVAN) 0.5 MG tablet Take 1 tablet (0.5 mg total) by mouth at bedtime as needed (Nausea or vomiting).  30 tablet  0  . ondansetron (ZOFRAN ODT) 4 MG disintegrating tablet Take 1 tablet (4 mg total) by mouth every 8 (eight) hours as needed for nausea or vomiting.  20 tablet  0  . ondansetron (ZOFRAN) 8 MG tablet Take 1 tablet (8 mg total) by mouth 2 (two) times daily as needed. Start on the third day after chemotherapy.  30 tablet  1  . prochlorperazine (COMPAZINE) 10 MG tablet Take 1 tablet (10 mg total) by mouth every 6 (six) hours as needed (Nausea or vomiting).  30 tablet  1  . SYNTHROID 137 MCG tablet       . triamterene-hydrochlorothiazide (MAXZIDE-25) 37.5-25 MG per tablet        No current facility-administered medications for this visit.    OBJECTIVE: Middle-aged white woman well based and well nourished not in acute distress  Filed Vitals:   04/04/14 0937  BP: 134/78   Pulse: 78  Temp: 97 F (36.1 C)  Resp: 18     Body mass index is 32.12 kg/(m^2).    ECOG FS:1 - Symptomatic but completely ambulatory  HEENT PERRLA, sclerae anicteric, conjunctival no pallor, neck supple, no JVD, no thyromegaly Ear-nose-throat: Oropharynx clear and moist Lymphatic: No cervical or supraclavicular adenopathy Lungs no rales or rhonchi, good excursion bilaterally Heart regular rate and rhythm, no murmur appreciated Abd soft, obese, nontender, positive bowel sounds MSK no focal spinal tenderness, no  upper extremity lymphedema  Neuro: non-focal, well-oriented, appropriate affect Breasts: Deferred.    LAB RESULTS:  CMP     Component Value Date/Time   NA 139 04/04/2014 0926   NA 140  03/13/2009 2325   K 3.9 04/04/2014 0926   K 3.7 03/13/2009 2325   CL 105 03/13/2009 2325   CO2 26 04/04/2014 0926   CO2 29 03/13/2009 2325   GLUCOSE 151* 04/04/2014 0926   GLUCOSE 107* 03/13/2009 2325   GLUCOSE 88 09/22/2006 1056   BUN 9.2 04/04/2014 0926   BUN 12 03/13/2009 2325   CREATININE 0.8 04/04/2014 0926   CREATININE 0.76 03/13/2009 2325   CALCIUM 9.8 04/04/2014 0926   CALCIUM 9.4 03/13/2009 2325   PROT 6.8 04/04/2014 0926   PROT 7.1 03/13/2009 2325   ALBUMIN 3.7 04/04/2014 0926   ALBUMIN 4.1 03/13/2009 2325   AST 22 04/04/2014 0926   AST 136* 03/13/2009 2325   ALT 43 04/04/2014 0926   ALT 303* 03/13/2009 2325   ALKPHOS 72 04/04/2014 0926   ALKPHOS 131* 03/13/2009 2325   BILITOT <0.20 04/04/2014 0926   BILITOT 0.5 03/13/2009 2325   GFRNONAA >60 03/13/2009 2325   GFRAA  Value: >60        The eGFR has been calculated using the MDRD equation. This calculation has not been validated in all clinical situations. eGFR's persistently <60 mL/min signify possible Chronic Kidney Disease. 03/13/2009 2325    I No results found for this basename: SPEP,  UPEP,   kappa and lambda light chains    Lab Results  Component Value Date   WBC 4.0 04/04/2014   NEUTROABS 2.2 04/04/2014   HGB 11.6 04/04/2014   HCT 36.3 04/04/2014    MCV 94.5 04/04/2014   PLT 211 04/04/2014      Chemistry      Component Value Date/Time   NA 139 04/04/2014 0926   NA 140 03/13/2009 2325   K 3.9 04/04/2014 0926   K 3.7 03/13/2009 2325   CL 105 03/13/2009 2325   CO2 26 04/04/2014 0926   CO2 29 03/13/2009 2325   BUN 9.2 04/04/2014 0926   BUN 12 03/13/2009 2325   CREATININE 0.8 04/04/2014 0926   CREATININE 0.76 03/13/2009 2325      Component Value Date/Time   CALCIUM 9.8 04/04/2014 0926   CALCIUM 9.4 03/13/2009 2325   ALKPHOS 72 04/04/2014 0926   ALKPHOS 131* 03/13/2009 2325   AST 22 04/04/2014 0926   AST 136* 03/13/2009 2325   ALT 43 04/04/2014 0926   ALT 303* 03/13/2009 2325   BILITOT <0.20 04/04/2014 0926   BILITOT 0.5 03/13/2009 2325       No results found for this basename: LABCA2    No components found with this basename: [id number]    No results found for this basename: INR,  in the last 168 hours  Urinalysis    Component Value Date/Time   COLORURINE YELLOW 03/13/2009 2251   APPEARANCEUR CLOUDY* 03/13/2009 2251   LABSPEC 1.018 03/13/2009 2251   PHURINE 5.5 03/13/2009 2251   GLUCOSEU NEGATIVE 03/13/2009 2251   HGBUR NEGATIVE 03/13/2009 2251   BILIRUBINUR NEGATIVE 03/13/2009 2251   KETONESUR NEGATIVE 03/13/2009 2251   PROTEINUR NEGATIVE 03/13/2009 2251   UROBILINOGEN 0.2 03/13/2009 2251   NITRITE NEGATIVE 03/13/2009 2251   LEUKOCYTESUR SMALL* 03/13/2009 2251    STUDIES: Nm Sentinel Node Inj-no Rpt (breast)  03/01/2014   CLINICAL DATA: left axillary sentinel node biopsy   Sulfur colloid was injected intradermally by the nuclear medicine  technologist for breast cancer sentinel node localization.    Dg Chest Port 1 View  03/01/2014   CLINICAL DATA:  Right Port-A-Cath placement  EXAM: PORTABLE CHEST - 1 VIEW  COMPARISON:  Chest x-ray of 02/27/2010  FINDINGS: The lungs are poorly aerated with basilar atelectasis. Therefore the questioned nodule medially in the right upper lower lobe near the apex on prior chest x-ray is not visible. Cardiomegaly is  stable. A right-sided Port-A-Cath is present with the tip overlying the lower SVC near the expected SVC -RA junction. No bony abnormality is seen. A small amount of air is noted in the soft tissues overlying the left axilla and left breast.  IMPRESSION: 1. Right-sided Port-A-Cath tip overlies the lower SVC. 2. Poor inspiration with basilar atelectasis. 3. Cardiomegaly.   Electronically Signed   By: Ivar Drape M.D.   On: 03/01/2014 16:20   Dg Fluoro Guide Cv Line-no Report  03/01/2014   CLINICAL DATA: port cath insertion   FLOURO GUIDE CV LINE  Fluoroscopy was utilized by the requesting physician.  No radiographic  interpretation.     ASSESSMENT: 53 y.o. [place] woman status post left breast biopsy 02/08/2014 for a clinical T1c N0, stage IA invasive ductal carcinoma, grade 3, triple negative, with an MIB-1 of 50%.  (1) status post left lumpectomy and sentinel lymph node sampling 03/01/2014 for a pT1c pN0, stage IA invasive ductal carcinoma, grade 3, with negative margins, repeat prognostic panel pending.  (2) adjuvant chemotherapy to start a 03/25/2014 consisting of cyclophosphamide and doxorubicin given in dose dense fashion x4, with Neulasta support on day 2; to be followed by weekly Taxol either weekly or every 2 weeks, as the patient may choose at that time  (3) adjuvant radiation to follow chemotherapy  (4) During the last visit she had a lengthy discussion about the importance chemotherapy with Dr. Jana Hakim.  " she has a small but dangerous breast cancer in the adjuvant! Protocol would quote her a 17% risk of dying from this tumor with local treatment only. This risk will drop to about 10% with adjuvant chemotherapy, which is the only form of systemic treatment available to her, since her cancer would not be expected to respond to anti-estrogens or anti-HER-2 treatment"  (5) Constipation   PLAN: Kaydon received her first cycle of dose dense AC. She tolerated her chemotherapy well  overall with the exception of constipation, nausea, and abdominal pain. We discussed using MiraLax and Colace on a regular basis to prevent constipation with her next cycle of chemotherapy. I have also advised her to take her Decadron with food. She states that she has a history of ulcers and has taken Nexium in the past. I have advised her to begin her Nexium on the days that she takes Decadron.  Her CBC was reviewed with her today. CMET is pending at the time of the visit.   The patient returned on July 10 for her second cycle of chemotherapy. Charron has a good understanding of overall plan. She agrees with it. She knows the goal of treatment in her case is cure. She will call with any problems that may develop before her next visit here.  Total time spent: 30 minutes  Mikey Bussing, NP   Medical oncology  04/04/2014 10:22 AM

## 2014-04-07 ENCOUNTER — Other Ambulatory Visit: Payer: Self-pay | Admitting: *Deleted

## 2014-04-07 DIAGNOSIS — C50212 Malignant neoplasm of upper-inner quadrant of left female breast: Secondary | ICD-10-CM

## 2014-04-08 ENCOUNTER — Ambulatory Visit (HOSPITAL_BASED_OUTPATIENT_CLINIC_OR_DEPARTMENT_OTHER): Payer: 59

## 2014-04-08 ENCOUNTER — Ambulatory Visit (HOSPITAL_BASED_OUTPATIENT_CLINIC_OR_DEPARTMENT_OTHER): Payer: 59 | Admitting: Adult Health

## 2014-04-08 ENCOUNTER — Other Ambulatory Visit: Payer: Self-pay | Admitting: Oncology

## 2014-04-08 ENCOUNTER — Other Ambulatory Visit (HOSPITAL_BASED_OUTPATIENT_CLINIC_OR_DEPARTMENT_OTHER): Payer: 59

## 2014-04-08 ENCOUNTER — Telehealth: Payer: Self-pay | Admitting: Adult Health

## 2014-04-08 ENCOUNTER — Encounter: Payer: Self-pay | Admitting: Adult Health

## 2014-04-08 VITALS — BP 148/84 | HR 75 | Temp 98.5°F | Resp 18 | Ht 65.0 in | Wt 195.0 lb

## 2014-04-08 DIAGNOSIS — R040 Epistaxis: Secondary | ICD-10-CM

## 2014-04-08 DIAGNOSIS — Z5111 Encounter for antineoplastic chemotherapy: Secondary | ICD-10-CM

## 2014-04-08 DIAGNOSIS — C50219 Malignant neoplasm of upper-inner quadrant of unspecified female breast: Secondary | ICD-10-CM

## 2014-04-08 DIAGNOSIS — Z171 Estrogen receptor negative status [ER-]: Secondary | ICD-10-CM

## 2014-04-08 DIAGNOSIS — C50212 Malignant neoplasm of upper-inner quadrant of left female breast: Secondary | ICD-10-CM

## 2014-04-08 LAB — CBC WITH DIFFERENTIAL/PLATELET
BASO%: 0.5 % (ref 0.0–2.0)
BASOS ABS: 0 10*3/uL (ref 0.0–0.1)
EOS%: 0.7 % (ref 0.0–7.0)
Eosinophils Absolute: 0 10*3/uL (ref 0.0–0.5)
HEMATOCRIT: 35.4 % (ref 34.8–46.6)
HEMOGLOBIN: 11.7 g/dL (ref 11.6–15.9)
LYMPH%: 29.5 % (ref 14.0–49.7)
MCH: 30.8 pg (ref 25.1–34.0)
MCHC: 33.1 g/dL (ref 31.5–36.0)
MCV: 93.2 fL (ref 79.5–101.0)
MONO#: 0.5 10*3/uL (ref 0.1–0.9)
MONO%: 8.9 % (ref 0.0–14.0)
NEUT#: 3.3 10*3/uL (ref 1.5–6.5)
NEUT%: 60.4 % (ref 38.4–76.8)
Platelets: 280 10*3/uL (ref 145–400)
RBC: 3.8 10*6/uL (ref 3.70–5.45)
RDW: 13.5 % (ref 11.2–14.5)
WBC: 5.5 10*3/uL (ref 3.9–10.3)
lymph#: 1.6 10*3/uL (ref 0.9–3.3)
nRBC: 0 % (ref 0–0)

## 2014-04-08 MED ORDER — DOXORUBICIN HCL CHEMO IV INJECTION 2 MG/ML
60.0000 mg/m2 | Freq: Once | INTRAVENOUS | Status: AC
  Administered 2014-04-08: 120 mg via INTRAVENOUS
  Filled 2014-04-08: qty 60

## 2014-04-08 MED ORDER — PALONOSETRON HCL INJECTION 0.25 MG/5ML
0.2500 mg | Freq: Once | INTRAVENOUS | Status: AC
Start: 2014-04-08 — End: 2014-04-08
  Administered 2014-04-08: 0.25 mg via INTRAVENOUS

## 2014-04-08 MED ORDER — PALONOSETRON HCL INJECTION 0.25 MG/5ML
INTRAVENOUS | Status: AC
  Filled 2014-04-08: qty 5

## 2014-04-08 MED ORDER — DEXAMETHASONE SODIUM PHOSPHATE 20 MG/5ML IJ SOLN
INTRAMUSCULAR | Status: AC
  Filled 2014-04-08: qty 5

## 2014-04-08 MED ORDER — SODIUM CHLORIDE 0.9 % IV SOLN
Freq: Once | INTRAVENOUS | Status: AC
  Administered 2014-04-08: 11:00:00 via INTRAVENOUS

## 2014-04-08 MED ORDER — DEXAMETHASONE SODIUM PHOSPHATE 20 MG/5ML IJ SOLN
12.0000 mg | Freq: Once | INTRAMUSCULAR | Status: AC
  Administered 2014-04-08: 12 mg via INTRAVENOUS

## 2014-04-08 MED ORDER — SODIUM CHLORIDE 0.9 % IJ SOLN
10.0000 mL | INTRAMUSCULAR | Status: DC | PRN
  Administered 2014-04-08: 10 mL
  Filled 2014-04-08: qty 10

## 2014-04-08 MED ORDER — HEPARIN SOD (PORK) LOCK FLUSH 100 UNIT/ML IV SOLN
500.0000 [IU] | Freq: Once | INTRAVENOUS | Status: AC | PRN
  Administered 2014-04-08: 500 [IU]
  Filled 2014-04-08: qty 5

## 2014-04-08 MED ORDER — SODIUM CHLORIDE 0.9 % IV SOLN
150.0000 mg | Freq: Once | INTRAVENOUS | Status: AC
  Administered 2014-04-08: 150 mg via INTRAVENOUS
  Filled 2014-04-08: qty 5

## 2014-04-08 MED ORDER — CYCLOPHOSPHAMIDE CHEMO INJECTION 1 GM
600.0000 mg/m2 | Freq: Once | INTRAMUSCULAR | Status: AC
  Administered 2014-04-08: 1200 mg via INTRAVENOUS
  Filled 2014-04-08: qty 60

## 2014-04-08 NOTE — Progress Notes (Signed)
Brisk blood return noted before, during, and after adria IVP. Patient tolerated well.

## 2014-04-08 NOTE — Telephone Encounter (Signed)
per pof to sch pt trmts 7/24 & 8/7. Sent email to MW to sch-will adv pt once sch

## 2014-04-08 NOTE — Progress Notes (Signed)
[place]  [phone] [phone]) [phone]     ID: Alexandria Bradley OB: 1961-06-05  MR#: [id number]  ZCH#:[id number]  PCP: Lovenia Kim, MD GYN:   SU: Alexandria Bradley OTHER MD: [name], Delrae Rend, Wilford Corner  CHIEF COMPLAINT: Triple negative breast cancer  CURRENT TREATMENT: Adjuvant chemotherapy  BREAST CANCER HISTORY: From the original intake note of 02/16/2014:  Alexandria Bradley palpated a mass in her left breast early May and brought it immediately to her gynecologist attention. He set her up for bilateral diagnostic mammography and left ultrasonography at [facility] 02/07/2014. Mammography showed a 1.3 cm oval mass with spiculated margins in the left breast at the 11:00 position. This was palpable. Ultrasound showed a 1.1 cm tall her than wide mass with a microlobulated margins. He was hypoechoic. There were no abnormalities noted in the left axilla.  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 Lyndon Code at conference 02/16/2014), triple negative, with an MIB-1 of 50%.  The patient's subsequent history is as detailed below  INTERVAL HISTORY: Alexandria Bradley  returns today for followup of her breast cancer. She is currently receiving Doxorubicin and Cyclophosphamide adjuvant chemotherapy.  She is here prior to receiving her second of four planned cycles of treatment.  She receives her treatment on day 1 of a 14 day cycle with Neulasta given on day 2 for granulocyte support.    Alexandria Bradley is doing moderately well today.  She is c/o recent nose bleeds.  She had frequent nose bleeds about a year ago that required cauterization.  She had a large nose bleed this morning that was difficult to get stopped.  She has had minor nose bleeds this past year, but nothing as large as this morning.  She denies any other easy bruising or bleeding.  She has  constipation, and for this she uses Miralax and benefiber which helps.  She did have nausea and stomach pain after the last treatment, but those have since resolved.  She otherwise denies fevers, chills, nausea, vomiting, diarrhea, numbness/tingling, mouth pain, nail changes, or any further concerns.    REVIEW OF SYSTEMS: A detailed 10 point review of systems is been assessed and pertinent symptoms as mentioned in interval history    PAST MEDICAL HISTORY: Past Medical History  Diagnosis Date  . Thyroid disease   . Osteoporosis   . Psoriasis   . Breast cancer   . Arthritis   . Back pain   . Hypothyroidism   . Anxiety   . Breast cancer     left    PAST SURGICAL HISTORY: Past Surgical History  Procedure Laterality Date  . Cholecystectomy    . Tonsillectomy    . Tubal ligation    . Myomectomy    . Lipoma excision    . Portacath placement N/A 03/01/2014    Procedure: INSERTION PORT-A-CATH;  Surgeon: Alexandria Bookbinder, MD;  Location: [facility];  Service: General;  Laterality: N/A;    FAMILY HISTORY Family History  Problem Relation Age of Onset  . Endometrial cancer Mother   . Lung cancer Father   . Brain cancer Father    the patient's father died at the age of 86 from what may have been metastatic lung cancer. The patient knows very little about the father's side of her family. Her mother is alive at age 44. She was recently diagnosed with endometrial cancer. The patient has  one brother, 2 sisters. There is no history of breast or ovarian cancer in the family.  GYNECOLOGIC HISTORY:  Menarche age 34, first live birth age 43. The patient is GX P3. One child died shortly after birth. She stopped having periods approximately 2005. She did not use hormone replacement. 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 Her husband Alexandria Bradley is a Insurance underwriter. Son Alexandria Bradley  is that she Nurse, adult in [place]. Daughter Alexandria Bradley lives in [place] and works for [employer] as an Therapist, sports.    ADVANCED DIRECTIVES: Not in place   HEALTH MAINTENANCE: History  Substance Use Topics  . Smoking status: Former Research scientist (life sciences)  . Smokeless tobacco: Not on file  . Alcohol Use: Yes     Comment: social     Colonoscopy: 2010  PAP: 2013  Bone density: 05/15/2009, at [facility], normal, lowest T score -0.8  Lipid panel:  Allergies  Allergen Reactions  . Prednisone Shortness Of Breath    "jacks me up" jittery  . Codeine Nausea Only    Nausea   . Cortisone Swelling    "jack me up"    Current Outpatient Prescriptions  Medication Sig Dispense Refill  . amLODipine (NORVASC) 5 MG tablet       . clonazePAM (KLONOPIN) 0.5 MG tablet Take 0.5 mg by mouth 2 (two) times daily as needed for anxiety.      Marland Kitchen dexamethasone (DECADRON) 4 MG tablet Take 2 tablets by mouth once a day on the day after chemotherapy and then take 2 tablets two times a day for 2 days. Take with food.  30 tablet  1  . HYDROcodone-acetaminophen (HYCET) 7.5-325 mg/15 ml solution Take 10 mLs by mouth 4 (four) times daily as needed for moderate pain.  120 mL  0  . lidocaine-prilocaine (EMLA) cream Apply 1 application topically as needed. Apply numbing cream over port site 1-2 hours before chemo and cover with plastic wrap  30 g  0  . liothyronine (CYTOMEL) 5 MCG tablet       . loratadine (CLARITIN) 10 MG tablet Take 10 mg by mouth daily.      Marland Kitchen LORazepam (ATIVAN) 0.5 MG tablet Take 1 tablet (0.5 mg total) by mouth at bedtime as needed (Nausea or vomiting).  30 tablet  0  . ondansetron (ZOFRAN ODT) 4 MG disintegrating tablet Take 1 tablet (4 mg total) by mouth every 8 (eight) hours as needed for nausea or vomiting.  20 tablet  0  . ondansetron (ZOFRAN) 8 MG tablet Take 1 tablet (8 mg total) by mouth 2 (two) times daily as needed. Start on the third day after chemotherapy.  30 tablet  1  .  prochlorperazine (COMPAZINE) 10 MG tablet Take 1 tablet (10 mg total) by mouth every 6 (six) hours as needed (Nausea or vomiting).  30 tablet  1  . SYNTHROID 137 MCG tablet       . triamterene-hydrochlorothiazide (MAXZIDE-25) 37.5-25 MG per tablet        No current facility-administered medications for this visit.    OBJECTIVE: Middle-aged white woman well based and well nourished not in acute distress  Filed Vitals:   04/08/14 0900  BP: 148/84  Pulse: 75  Temp: 98.5 F (36.9 C)  Resp: 18     Body mass index is 32.45 kg/(m^2).    ECOG FS:1 - Symptomatic but completely ambulatory  HEENT PERRLA, sclerae anicteric, conjunctival no pallor, neck supple, no JVD, no thyromegaly Ear-nose-throat: Oropharynx clear and moist Lymphatic: No cervical or supraclavicular adenopathy Lungs no rales or rhonchi, good excursion bilaterally Heart regular rate and rhythm, no murmur appreciated Abd soft, obese, nontender, positive bowel sounds MSK no focal spinal tenderness, no  upper extremity lymphedema  Neuro: non-focal, well-oriented, appropriate affect Breasts: Deferred.    LAB RESULTS:  CMP     Component Value Date/Time   NA 139 04/04/2014 0926   NA 140 03/13/2009 2325   K 3.9 04/04/2014 0926   K 3.7 03/13/2009 2325   CL 105 03/13/2009 2325   CO2 26 04/04/2014 0926   CO2 29 03/13/2009 2325   GLUCOSE 151* 04/04/2014 0926   GLUCOSE 107* 03/13/2009 2325   GLUCOSE 88 09/22/2006 1056   BUN 9.2 04/04/2014 0926   BUN 12 03/13/2009 2325   CREATININE 0.8 04/04/2014 0926   CREATININE 0.76 03/13/2009 2325   CALCIUM 9.8 04/04/2014 0926   CALCIUM 9.4 03/13/2009 2325   PROT 6.8 04/04/2014 0926   PROT 7.1 03/13/2009 2325   ALBUMIN 3.7 04/04/2014 0926   ALBUMIN 4.1 03/13/2009 2325   AST 22 04/04/2014 0926   AST 136* 03/13/2009 2325   ALT 43 04/04/2014 0926   ALT 303* 03/13/2009 2325   ALKPHOS 72 04/04/2014 0926   ALKPHOS 131* 03/13/2009 2325   BILITOT <0.20 04/04/2014 0926   BILITOT 0.5 03/13/2009 2325   GFRNONAA >60 03/13/2009  2325   GFRAA  Value: >60        The eGFR has been calculated using the MDRD equation. This calculation has not been validated in all clinical situations. eGFR's persistently <60 mL/min signify possible Chronic Kidney Disease. 03/13/2009 2325    I No results found for this basename: SPEP,  UPEP,   kappa and lambda light chains    Lab Results  Component Value Date   WBC 5.5 04/08/2014   NEUTROABS 3.3 04/08/2014   HGB 11.7 04/08/2014   HCT 35.4 04/08/2014   MCV 93.2 04/08/2014   PLT 280 04/08/2014      Chemistry      Component Value Date/Time   NA 139 04/04/2014 0926   NA 140 03/13/2009 2325   K 3.9 04/04/2014 0926   K 3.7 03/13/2009 2325   CL 105 03/13/2009 2325   CO2 26 04/04/2014 0926   CO2 29 03/13/2009 2325   BUN 9.2 04/04/2014 0926   BUN 12 03/13/2009 2325   CREATININE 0.8 04/04/2014 0926   CREATININE 0.76 03/13/2009 2325      Component Value Date/Time   CALCIUM 9.8 04/04/2014 0926   CALCIUM 9.4 03/13/2009 2325   ALKPHOS 72 04/04/2014 0926   ALKPHOS 131* 03/13/2009 2325   AST 22 04/04/2014 0926   AST 136* 03/13/2009 2325   ALT 43 04/04/2014 0926   ALT 303* 03/13/2009 2325   BILITOT <0.20 04/04/2014 0926   BILITOT 0.5 03/13/2009 2325       No results found for this basename: LABCA2    No components found with this basename: LABCA125    No results found for this basename: INR,  in the last 168 hours  Urinalysis    Component Value Date/Time   COLORURINE YELLOW 03/13/2009 2251   APPEARANCEUR CLOUDY* 03/13/2009 2251   LABSPEC 1.018 03/13/2009 2251   PHURINE 5.5 03/13/2009 [place] 03/13/2009 2251   HGBUR NEGATIVE 03/13/2009 [place] NEGATIVE 03/13/2009 2251   KETONESUR NEGATIVE 03/13/2009 2251  PROTEINUR NEGATIVE 03/13/2009 2251   UROBILINOGEN 0.2 03/13/2009 2251   NITRITE NEGATIVE 03/13/2009 2251   LEUKOCYTESUR SMALL* 03/13/2009 2251    STUDIES: Nm Sentinel Node Inj-no Rpt (breast)  03/01/2014   CLINICAL DATA: left axillary sentinel node biopsy   Sulfur colloid was  injected intradermally by the nuclear medicine  technologist for breast cancer sentinel node localization.    Dg Chest Port 1 View  03/01/2014   CLINICAL DATA:  Right Port-A-Cath placement  EXAM: PORTABLE CHEST - 1 VIEW  COMPARISON:  Chest x-ray of 02/27/2010  FINDINGS: The lungs are poorly aerated with basilar atelectasis. Therefore the questioned nodule medially in the right upper lower lobe near the apex on prior chest x-ray is not visible. Cardiomegaly is stable. A right-sided Port-A-Cath is present with the tip overlying the lower SVC near the expected SVC -RA junction. No bony abnormality is seen. A small amount of air is noted in the soft tissues overlying the left axilla and left breast.  IMPRESSION: 1. Right-sided Port-A-Cath tip overlies the lower SVC. 2. Poor inspiration with basilar atelectasis. 3. Cardiomegaly.   Electronically Signed   By: Ivar Drape M.D.   On: 03/01/2014 16:20   Dg Fluoro Guide Cv Line-no Report  03/01/2014   CLINICAL DATA: port cath insertion   FLOURO GUIDE CV LINE  Fluoroscopy was utilized by the requesting physician.  No radiographic  interpretation.     ASSESSMENT: 53 y.o. [place] woman status post left breast biopsy 02/08/2014 for a clinical T1c N0, stage IA invasive ductal carcinoma, grade 3, triple negative, with an MIB-1 of 50%.  (1) status post left lumpectomy and sentinel lymph node sampling 03/01/2014 for a pT1c pN0, stage IA invasive ductal carcinoma, grade 3, with negative margins, repeat prognostic panel pending.  (2) adjuvant chemotherapy to start a 03/25/2014 consisting of cyclophosphamide and doxorubicin given in dose dense fashion x4, with Neulasta support on day 2; to be followed by weekly Taxol either weekly or every 2 weeks, as the patient may choose at that time  (3) adjuvant radiation to follow chemotherapy  (4) During the last visit she had a lengthy discussion about the importance chemotherapy with Dr. Jana Hakim.  " she has a small but  dangerous breast cancer in the adjuvant! Protocol would quote her a 17% risk of dying from this tumor with local treatment only. This risk will drop to about 10% with adjuvant chemotherapy, which is the only form of systemic treatment available to her, since her cancer would not be expected to respond to anti-estrogens or anti-HER-2 treatment"  (5) Constipation--managed by Miralax and benefiber.   PLAN:  Blayre is doing well today.  Her CBC is stable.  She will proceed with chemotherapy today.  A recent CMP was normal also.  In regards to her epistaxis, I recommended she use saline nasal spray twice a day.  If she develops another nose bleed that wont stop, I recommended using Afrin, but cautioned her on using it more than 3 days in a row.  She was also recommended to f/u with ENT.    In regards to the stomach pain following chemotherapy, she does have prescription Nexium.  I recommended she take this every morning regardless of how she feels to neutralize the acidic environment of her stomach while receiving Dexamethasone.  She does have a history of ulcers, and we want to prevent any recurrence.  Aliviyah will return tomorrow for Neulasta and in one week for labs and evaluation for any chemotoxicities.  Eila has a good understanding of overall plan. She agrees with it. She knows the goal of treatment in her case is cure. She will call with any problems that may develop before her next visit here.  I spent 25 minutes counseling the patient face to face.  The total time spent in the appointment was 30 minutes.  Minette Headland, [place] [phone] 04/10/2014 9:42 AM

## 2014-04-08 NOTE — Patient Instructions (Signed)
[facility] Discharge Instructions for Patients Receiving Chemotherapy  Today you received the following chemotherapy agents Adriamycin and Cytoxan.  To help prevent nausea and vomiting after your treatment, we encourage you to take your nausea medication as prescribed.   If you develop nausea and vomiting that is not controlled by your nausea medication, call the clinic.   BELOW ARE SYMPTOMS THAT SHOULD BE REPORTED IMMEDIATELY:  *FEVER GREATER THAN 100.5 F  *CHILLS WITH OR WITHOUT FEVER  NAUSEA AND VOMITING THAT IS NOT CONTROLLED WITH YOUR NAUSEA MEDICATION  *UNUSUAL SHORTNESS OF BREATH  *UNUSUAL BRUISING OR BLEEDING  TENDERNESS IN MOUTH AND THROAT WITH OR WITHOUT PRESENCE OF ULCERS  *URINARY PROBLEMS  *BOWEL PROBLEMS  UNUSUAL RASH Items with * indicate a potential emergency and should be followed up as soon as possible.  Feel free to call the clinic you have any questions or concerns. The clinic phone number is [phone].    

## 2014-04-08 NOTE — Patient Instructions (Signed)
I recommend you use saline nasal spray twice a day and keep afrin on hand if you need it.  Take Nexium every morning.    You will proceed with chemotherapy.  We will see you back in one week.  Please call us if you have any questions or concerns.

## 2014-04-09 ENCOUNTER — Ambulatory Visit (HOSPITAL_BASED_OUTPATIENT_CLINIC_OR_DEPARTMENT_OTHER): Payer: 59

## 2014-04-09 VITALS — BP 137/78 | HR 92 | Temp 97.6°F | Resp 18

## 2014-04-09 DIAGNOSIS — Z5189 Encounter for other specified aftercare: Secondary | ICD-10-CM

## 2014-04-09 DIAGNOSIS — C50219 Malignant neoplasm of upper-inner quadrant of unspecified female breast: Secondary | ICD-10-CM

## 2014-04-09 MED ORDER — PEGFILGRASTIM INJECTION 6 MG/0.6ML
6.0000 mg | Freq: Once | SUBCUTANEOUS | Status: AC
  Administered 2014-04-09: 6 mg via SUBCUTANEOUS

## 2014-04-09 NOTE — Patient Instructions (Signed)

## 2014-04-11 ENCOUNTER — Telehealth: Payer: Self-pay | Admitting: *Deleted

## 2014-04-11 NOTE — Telephone Encounter (Signed)
Per staff message and POF I have scheduled appts. Advised scheduler of appts. JMW  

## 2014-04-15 ENCOUNTER — Other Ambulatory Visit (HOSPITAL_BASED_OUTPATIENT_CLINIC_OR_DEPARTMENT_OTHER): Payer: 59

## 2014-04-15 ENCOUNTER — Ambulatory Visit (HOSPITAL_BASED_OUTPATIENT_CLINIC_OR_DEPARTMENT_OTHER): Payer: 59 | Admitting: Adult Health

## 2014-04-15 ENCOUNTER — Encounter: Payer: Self-pay | Admitting: Adult Health

## 2014-04-15 VITALS — BP 143/78 | HR 67 | Temp 98.4°F | Resp 20 | Ht 65.0 in | Wt 193.3 lb

## 2014-04-15 DIAGNOSIS — C50212 Malignant neoplasm of upper-inner quadrant of left female breast: Secondary | ICD-10-CM

## 2014-04-15 DIAGNOSIS — R252 Cramp and spasm: Secondary | ICD-10-CM

## 2014-04-15 DIAGNOSIS — K5900 Constipation, unspecified: Secondary | ICD-10-CM

## 2014-04-15 DIAGNOSIS — C50219 Malignant neoplasm of upper-inner quadrant of unspecified female breast: Secondary | ICD-10-CM

## 2014-04-15 DIAGNOSIS — D702 Other drug-induced agranulocytosis: Secondary | ICD-10-CM

## 2014-04-15 DIAGNOSIS — Z171 Estrogen receptor negative status [ER-]: Secondary | ICD-10-CM

## 2014-04-15 DIAGNOSIS — D709 Neutropenia, unspecified: Secondary | ICD-10-CM

## 2014-04-15 LAB — COMPREHENSIVE METABOLIC PANEL (CC13)
ALK PHOS: 77 U/L (ref 40–150)
ALT: 39 U/L (ref 0–55)
AST: 13 U/L (ref 5–34)
Albumin: 3.7 g/dL (ref 3.5–5.0)
Anion Gap: 8 mEq/L (ref 3–11)
BUN: 12.2 mg/dL (ref 7.0–26.0)
CALCIUM: 9.5 mg/dL (ref 8.4–10.4)
CHLORIDE: 99 meq/L (ref 98–109)
CO2: 30 mEq/L — ABNORMAL HIGH (ref 22–29)
CREATININE: 0.6 mg/dL (ref 0.6–1.1)
Glucose: 93 mg/dl (ref 70–140)
POTASSIUM: 3.6 meq/L (ref 3.5–5.1)
Sodium: 137 mEq/L (ref 136–145)
Total Bilirubin: 0.27 mg/dL (ref 0.20–1.20)
Total Protein: 6.5 g/dL (ref 6.4–8.3)

## 2014-04-15 LAB — CBC WITH DIFFERENTIAL/PLATELET
BASO%: 1.8 % (ref 0.0–2.0)
BASOS ABS: 0 10*3/uL (ref 0.0–0.1)
EOS ABS: 0.1 10*3/uL (ref 0.0–0.5)
EOS%: 8.1 % — AB (ref 0.0–7.0)
HCT: 33.5 % — ABNORMAL LOW (ref 34.8–46.6)
HEMOGLOBIN: 11.2 g/dL — AB (ref 11.6–15.9)
LYMPH%: 66.7 % — AB (ref 14.0–49.7)
MCH: 30.9 pg (ref 25.1–34.0)
MCHC: 33.4 g/dL (ref 31.5–36.0)
MCV: 92.3 fL (ref 79.5–101.0)
MONO#: 0.2 10*3/uL (ref 0.1–0.9)
MONO%: 14.4 % — ABNORMAL HIGH (ref 0.0–14.0)
NEUT%: 9 % — ABNORMAL LOW (ref 38.4–76.8)
NEUTROS ABS: 0.1 10*3/uL — AB (ref 1.5–6.5)
Platelets: 292 10*3/uL (ref 145–400)
RBC: 3.63 10*6/uL — AB (ref 3.70–5.45)
RDW: 13.2 % (ref 11.2–14.5)
WBC: 1.1 10*3/uL — ABNORMAL LOW (ref 3.9–10.3)
lymph#: 0.7 10*3/uL — ABNORMAL LOW (ref 0.9–3.3)

## 2014-04-15 MED ORDER — CIPROFLOXACIN HCL 500 MG PO TABS: 500.0000 mg | ORAL_TABLET | Freq: Two times a day (BID) | ORAL | Status: DC

## 2014-04-15 NOTE — Progress Notes (Signed)
[place]  [phone] [phone]     ID: Alexandria Bradley OB: 23-Mar-1961  MR#: [id number]  HMC#:[id number]  PCP: Alexandria Kim, MD GYN:   SU: Alexandria Bradley OTHER MD: Alexandria Bradley, Alexandria Bradley, Alexandria Bradley  CHIEF COMPLAINT: Triple negative breast cancer  CURRENT TREATMENT: Adjuvant chemotherapy  BREAST CANCER HISTORY: From the original intake note of 02/16/2014:  Alexandria Bradley palpated a mass in her left breast early May and brought it immediately to her gynecologist attention. He set her up for bilateral diagnostic mammography and left ultrasonography at [facility] 02/07/2014. Mammography showed a 1.3 cm oval mass with spiculated margins in the left breast at the 11:00 position. This was palpable. Ultrasound showed a 1.1 cm tall her than wide mass with a microlobulated margins. He was hypoechoic. There were no abnormalities noted in the left axilla.  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 Lyndon Bradley at conference 02/16/2014), triple negative, with an MIB-1 of 50%.  The patient's subsequent history is as detailed below  INTERVAL HISTORY: Alexandria Bradley  returns today for followup of her breast cancer. She is currently receiving Doxorubicin and Cyclophosphamide adjuvant chemotherapy.   She receives her treatment on day 1 of a 14 day cycle with Neulasta given on day 2 for granulocyte support.  Today is cycle 2 day 8 of therapy.  She is experiencing cramps in her legs at night that wake her up.  They are occuring more frequently and are excruciating and difficult for her to deal with.  It has been worse this past week and she wants to know what to do to prevent them.  Otherwise she is doing very well.  She does feel like she is drinking enough fluid.  She denies any current fevers, chills, nausea, vomiting, constipation, diarrhea,  numbness/tingling, skin changes, or any further concerns.  REVIEW OF SYSTEMS: A 10 point review of systems was conducted and is otherwise negative except for what is noted above.      PAST MEDICAL HISTORY: Past Medical History  Diagnosis Date  . Thyroid disease   . Osteoporosis   . Psoriasis   . Breast cancer   . Arthritis   . Back pain   . Hypothyroidism   . Anxiety   . Breast cancer     left    PAST SURGICAL HISTORY: Past Surgical History  Procedure Laterality Date  . Cholecystectomy    . Tonsillectomy    . Tubal ligation    . Myomectomy    . Lipoma excision    . Portacath placement N/A 03/01/2014    Procedure: INSERTION PORT-A-CATH;  Surgeon: Alexandria Bookbinder, MD;  Location: [place];  Service: General;  Laterality: N/A;    FAMILY HISTORY Family History  Problem Relation Age of Onset  . Endometrial cancer Mother   . Lung cancer Father   . Brain cancer Father    the patient's father died at the age of 71 from what may have been metastatic lung cancer. The patient knows very little about the father's side of her family. Her mother is alive at age 89. She was recently diagnosed with endometrial cancer. The patient has one brother, 2 sisters. There is no history of breast or ovarian cancer in the family.  GYNECOLOGIC HISTORY:  Menarche age 57, first live birth age 68. The patient is GX P3. One child died shortly after birth. She  stopped having periods approximately 2005. She did not use hormone replacement. 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 Her husband Alexandria Bradley is a Insurance underwriter. Son Alexandria Bradley is that she Nurse, adult in [place]. Daughter Alexandria Bradley lives in [place] and works for [employer] as an Therapist, sports.    ADVANCED DIRECTIVES: Not in place   HEALTH MAINTENANCE: History  Substance Use Topics  . Smoking status: Former  Research scientist (life sciences)  . Smokeless tobacco: Not on file  . Alcohol Use: Yes     Comment: social     Colonoscopy: 2010  PAP: 2013  Bone density: 05/15/2009, at [facility] [place], normal, lowest T score -0.8  Lipid panel:  Allergies  Allergen Reactions  . Prednisone Shortness Of Breath    "jacks me up" jittery  . Codeine Nausea Only    Nausea   . Cortisone Swelling    "jack me up"    Current Outpatient Prescriptions  Medication Sig Dispense Refill  . acetaminophen (TYLENOL) 500 MG tablet Take 500 mg by mouth every 6 (six) hours as needed for headache.      Marland Kitchen amLODipine (NORVASC) 5 MG tablet       . clonazePAM (KLONOPIN) 0.5 MG tablet Take 0.5 mg by mouth 2 (two) times daily as needed for anxiety.      Marland Kitchen liothyronine (CYTOMEL) 5 MCG tablet       . loratadine (CLARITIN) 10 MG tablet Take 10 mg by mouth daily.      Marland Kitchen SYNTHROID 137 MCG tablet       . triamterene-hydrochlorothiazide (MAXZIDE-25) 37.5-25 MG per tablet       . dexamethasone (DECADRON) 4 MG tablet Take 2 tablets by mouth once a day on the day after chemotherapy and then take 2 tablets two times a day for 2 days. Take with food.  30 tablet  1  . HYDROcodone-acetaminophen (HYCET) 7.5-325 mg/15 ml solution Take 10 mLs by mouth 4 (four) times daily as needed for moderate pain.  120 mL  0  . lidocaine-prilocaine (EMLA) cream Apply 1 application topically as needed. Apply numbing cream over port site 1-2 hours before chemo and cover with plastic wrap  30 g  0  . LORazepam (ATIVAN) 0.5 MG tablet Take 1 tablet (0.5 mg total) by mouth at bedtime as needed (Nausea or vomiting).  30 tablet  0  . ondansetron (ZOFRAN ODT) 4 MG disintegrating tablet Take 1 tablet (4 mg total) by mouth every 8 (eight) hours as needed for nausea or vomiting.  20 tablet  0  . ondansetron (ZOFRAN) 8 MG tablet Take 1 tablet (8 mg total) by mouth 2 (two) times daily as needed. Start on the third day after chemotherapy.  30 tablet  1  . prochlorperazine (COMPAZINE) 10 MG tablet  Take 1 tablet (10 mg total) by mouth every 6 (six) hours as needed (Nausea or vomiting).  30 tablet  1   No current facility-administered medications for this visit.    OBJECTIVE: Middle-aged white woman well based and well nourished not in acute distress  Filed Vitals:   04/15/14 1342  BP: 143/78  Pulse: 67  Temp: 98.4 F (36.9 C)  Resp: 20     Body mass index is 32.17 kg/(m^2).    ECOG FS:1 - Symptomatic but completely ambulatory  GENERAL: Patient is a well appearing female in no acute distress HEENT:  Sclerae anicteric.  Oropharynx clear and moist. No ulcerations or evidence of oropharyngeal candidiasis. Neck is supple.  NODES:  No cervical, supraclavicular, or axillary lymphadenopathy palpated.  BREAST EXAM:  Deferred. LUNGS:  Clear to auscultation bilaterally.  No wheezes or rhonchi. HEART:  Regular rate and rhythm. No murmur appreciated. ABDOMEN:  Soft, nontender.  Positive, normoactive bowel sounds. No organomegaly palpated. MSK:  No focal spinal tenderness to palpation. Full range of motion bilaterally in the upper extremities. EXTREMITIES:  No peripheral edema.   SKIN:  Clear with no obvious rashes or skin changes. No nail dyscrasia. NEURO:  Nonfocal. Well oriented.  Appropriate affect.     LAB RESULTS:  CMP     Component Value Date/Time   NA 139 04/04/2014 0926   NA 140 03/13/2009 2325   K 3.9 04/04/2014 0926   K 3.7 03/13/2009 2325   CL 105 03/13/2009 2325   CO2 26 04/04/2014 0926   CO2 29 03/13/2009 2325   GLUCOSE 151* 04/04/2014 0926   GLUCOSE 107* 03/13/2009 2325   GLUCOSE 88 09/22/2006 1056   BUN 9.2 04/04/2014 0926   BUN 12 03/13/2009 2325   CREATININE 0.8 04/04/2014 0926   CREATININE 0.76 03/13/2009 2325   CALCIUM 9.8 04/04/2014 0926   CALCIUM 9.4 03/13/2009 2325   PROT 6.8 04/04/2014 0926   PROT 7.1 03/13/2009 2325   ALBUMIN 3.7 04/04/2014 0926   ALBUMIN 4.1 03/13/2009 2325   AST 22 04/04/2014 0926   AST 136* 03/13/2009 2325   ALT 43 04/04/2014 0926   ALT 303* 03/13/2009  2325   ALKPHOS 72 04/04/2014 0926   ALKPHOS 131* 03/13/2009 2325   BILITOT <0.20 04/04/2014 0926   BILITOT 0.5 03/13/2009 2325   GFRNONAA >60 03/13/2009 2325   GFRAA  Value: >60        The eGFR has been calculated using the MDRD equation. This calculation has not been validated in all clinical situations. eGFR's persistently <60 mL/min signify possible Chronic Kidney Disease. 03/13/2009 2325    I No results found for this basename: SPEP,  UPEP,   kappa and lambda light chains    Lab Results  Component Value Date   WBC 1.1* 04/15/2014   NEUTROABS 0.1* 04/15/2014   HGB 11.2* 04/15/2014   HCT 33.5* 04/15/2014   MCV 92.3 04/15/2014   PLT 292 04/15/2014      Chemistry      Component Value Date/Time   NA 139 04/04/2014 0926   NA 140 03/13/2009 2325   K 3.9 04/04/2014 0926   K 3.7 03/13/2009 2325   CL 105 03/13/2009 2325   CO2 26 04/04/2014 0926   CO2 29 03/13/2009 2325   BUN 9.2 04/04/2014 0926   BUN 12 03/13/2009 2325   CREATININE 0.8 04/04/2014 0926   CREATININE 0.76 03/13/2009 2325      Component Value Date/Time   CALCIUM 9.8 04/04/2014 0926   CALCIUM 9.4 03/13/2009 2325   ALKPHOS 72 04/04/2014 0926   ALKPHOS 131* 03/13/2009 2325   AST 22 04/04/2014 0926   AST 136* 03/13/2009 2325   ALT 43 04/04/2014 0926   ALT 303* 03/13/2009 2325   BILITOT <0.20 04/04/2014 0926   BILITOT 0.5 03/13/2009 2325       No results found for this basename: LABCA2    No components found with this basename: [id number]    No results found for this basename: INR,  in the last 168 hours  Urinalysis    Component Value Date/Time   COLORURINE YELLOW 03/13/2009 2251  APPEARANCEUR CLOUDY* 03/13/2009 2251   LABSPEC 1.018 03/13/2009 2251   PHURINE 5.5 03/13/2009 2251   GLUCOSEU NEGATIVE 03/13/2009 2251   HGBUR NEGATIVE 03/13/2009 2251   BILIRUBINUR NEGATIVE 03/13/2009 2251   KETONESUR NEGATIVE 03/13/2009 2251   PROTEINUR NEGATIVE 03/13/2009 2251   UROBILINOGEN 0.2 03/13/2009 2251   NITRITE NEGATIVE 03/13/2009 2251   LEUKOCYTESUR SMALL*  03/13/2009 2251    STUDIES: Nm Sentinel Node Inj-no Rpt (breast)  03/01/2014   CLINICAL DATA: left axillary sentinel node biopsy   Sulfur colloid was injected intradermally by the nuclear medicine  technologist for breast cancer sentinel node localization.    Dg Chest Port 1 View  03/01/2014   CLINICAL DATA:  Right Port-A-Cath placement  EXAM: PORTABLE CHEST - 1 VIEW  COMPARISON:  Chest x-ray of 02/27/2010  FINDINGS: The lungs are poorly aerated with basilar atelectasis. Therefore the questioned nodule medially in the right upper lower lobe near the apex on prior chest x-ray is not visible. Cardiomegaly is stable. A right-sided Port-A-Cath is present with the tip overlying the lower SVC near the expected SVC -RA junction. No bony abnormality is seen. A small amount of air is noted in the soft tissues overlying the left axilla and left breast.  IMPRESSION: 1. Right-sided Port-A-Cath tip overlies the lower SVC. 2. Poor inspiration with basilar atelectasis. 3. Cardiomegaly.   Electronically Signed   By: Ivar Drape M.D.   On: 03/01/2014 16:20   Dg Fluoro Guide Cv Line-no Report  03/01/2014   CLINICAL DATA: port cath insertion   FLOURO GUIDE CV LINE  Fluoroscopy was utilized by the requesting physician.  No radiographic  interpretation.     ASSESSMENT: 53 y.o. [place] woman status post left breast biopsy 02/08/2014 for a clinical T1c N0, stage IA invasive ductal carcinoma, grade 3, triple negative, with an MIB-1 of 50%.  (1) status post left lumpectomy and sentinel lymph node sampling 03/01/2014 for a pT1c pN0, stage IA invasive ductal carcinoma, grade 3, with negative margins, repeat prognostic panel pending.  (2) adjuvant chemotherapy to start a 03/25/2014 consisting of cyclophosphamide and doxorubicin given in dose dense fashion x4, with Neulasta support on day 2; to be followed by weekly Taxol either weekly or every 2 weeks, as the patient may choose at that time  (3) adjuvant radiation to follow  chemotherapy  (4) During a previous appointment with Dr. Jana Hakim, they had a lengthy discussion about the importance chemotherapy " she has a small but dangerous breast cancer in the adjuvant! Protocol would quote her a 17% risk of dying from this tumor with local treatment only. This risk will drop to about 10% with adjuvant chemotherapy, which is the only form of systemic treatment available to her, since her cancer would not be expected to respond to anti-estrogens or anti-HER-2 treatment"  (5) Constipation--managed by Miralax and benefiber.   PLAN:  Islam is doing well today.  She tolerated her treatment relatively well.  She is subsequently neutropenic following chemotherapy.  I prescribed Cipro for her to take BID.  I gave her detailed neutropenic instructions in her AVS including her lab work and reviewed this with her in detail.    I recommended she try super b complex, or magnesium for her leg cramps.    Arpi will return in one week for labs, evaluation and cycle 3 of Doxorubicin and Cyclophosphamide.    Meryl has a good understanding of overall plan. She agrees with it. She knows the goal of treatment in her case is  cure. She will call with any problems that may develop before her next visit here.  I spent 25 minutes counseling the patient face to face.  The total time spent in the appointment was 30 minutes.  Minette Headland, [place] [phone] 04/15/2014 2:14 PM

## 2014-04-15 NOTE — Patient Instructions (Signed)
  Patient Neutropenia Instruction Sheet  Diagnosis: Breast Cancer      Treating Physician: Dr. Jana Hakim  Treatment: 1. Type of chemotherapy: Doxorubicin and Cyclophosphamide 2. Date of last treatment: 04/08/14  Last Blood Counts: Lab Results  Component Value Date   WBC 1.1* 04/15/2014   HGB 11.2* 04/15/2014   HCT 33.5* 04/15/2014   MCV 92.3 04/15/2014   PLT 292 04/15/2014  ANC 100     Prophylactic Antibiotics: Cipro 500 mg by mouth twice a day Instructions: 1. Monitor temperature and call if fever  greater than 100.5, chills, shaking chills (rigors) 2. Call Physician on-call at [phone] 3. Give him/her symptoms and list of medications that you are taking and your last blood count.

## 2014-04-21 DIAGNOSIS — D702 Other drug-induced agranulocytosis: Secondary | ICD-10-CM | POA: Insufficient documentation

## 2014-04-22 ENCOUNTER — Telehealth: Payer: Self-pay | Admitting: Oncology

## 2014-04-22 ENCOUNTER — Ambulatory Visit (HOSPITAL_BASED_OUTPATIENT_CLINIC_OR_DEPARTMENT_OTHER): Payer: 59

## 2014-04-22 ENCOUNTER — Other Ambulatory Visit (HOSPITAL_BASED_OUTPATIENT_CLINIC_OR_DEPARTMENT_OTHER): Payer: 59

## 2014-04-22 ENCOUNTER — Ambulatory Visit (HOSPITAL_BASED_OUTPATIENT_CLINIC_OR_DEPARTMENT_OTHER): Payer: 59 | Admitting: Oncology

## 2014-04-22 VITALS — BP 135/76 | HR 79 | Temp 98.2°F | Resp 20 | Ht 65.0 in | Wt 195.1 lb

## 2014-04-22 DIAGNOSIS — C50212 Malignant neoplasm of upper-inner quadrant of left female breast: Secondary | ICD-10-CM

## 2014-04-22 DIAGNOSIS — Z171 Estrogen receptor negative status [ER-]: Secondary | ICD-10-CM

## 2014-04-22 DIAGNOSIS — C50219 Malignant neoplasm of upper-inner quadrant of unspecified female breast: Secondary | ICD-10-CM

## 2014-04-22 DIAGNOSIS — Z5111 Encounter for antineoplastic chemotherapy: Secondary | ICD-10-CM

## 2014-04-22 LAB — CBC WITH DIFFERENTIAL/PLATELET
BASO%: 1 % (ref 0.0–2.0)
Basophils Absolute: 0.1 10*3/uL (ref 0.0–0.1)
EOS ABS: 0 10*3/uL (ref 0.0–0.5)
EOS%: 0.3 % (ref 0.0–7.0)
HCT: 34.6 % — ABNORMAL LOW (ref 34.8–46.6)
HGB: 11.4 g/dL — ABNORMAL LOW (ref 11.6–15.9)
LYMPH%: 19 % (ref 14.0–49.7)
MCH: 30.9 pg (ref 25.1–34.0)
MCHC: 32.9 g/dL (ref 31.5–36.0)
MCV: 93.9 fL (ref 79.5–101.0)
MONO#: 0.5 10*3/uL (ref 0.1–0.9)
MONO%: 8 % (ref 0.0–14.0)
NEUT#: 4.1 10*3/uL (ref 1.5–6.5)
NEUT%: 71.7 % (ref 38.4–76.8)
Platelets: 201 10*3/uL (ref 145–400)
RBC: 3.68 10*6/uL — ABNORMAL LOW (ref 3.70–5.45)
RDW: 13.7 % (ref 11.2–14.5)
WBC: 5.7 10*3/uL (ref 3.9–10.3)
lymph#: 1.1 10*3/uL (ref 0.9–3.3)

## 2014-04-22 LAB — COMPREHENSIVE METABOLIC PANEL (CC13)
ALBUMIN: 3.9 g/dL (ref 3.5–5.0)
ALT: 62 U/L — ABNORMAL HIGH (ref 0–55)
AST: 32 U/L (ref 5–34)
Alkaline Phosphatase: 72 U/L (ref 40–150)
Anion Gap: 11 mEq/L (ref 3–11)
BILIRUBIN TOTAL: 0.25 mg/dL (ref 0.20–1.20)
BUN: 12.4 mg/dL (ref 7.0–26.0)
CO2: 24 mEq/L (ref 22–29)
CREATININE: 0.8 mg/dL (ref 0.6–1.1)
Calcium: 9.8 mg/dL (ref 8.4–10.4)
Chloride: 106 mEq/L (ref 98–109)
Glucose: 135 mg/dl (ref 70–140)
POTASSIUM: 3.7 meq/L (ref 3.5–5.1)
Sodium: 141 mEq/L (ref 136–145)
Total Protein: 7.2 g/dL (ref 6.4–8.3)

## 2014-04-22 MED ORDER — DOXORUBICIN HCL CHEMO IV INJECTION 2 MG/ML
60.0000 mg/m2 | Freq: Once | INTRAVENOUS | Status: AC
  Administered 2014-04-22: 120 mg via INTRAVENOUS
  Filled 2014-04-22: qty 60

## 2014-04-22 MED ORDER — SODIUM CHLORIDE 0.9 % IV SOLN
150.0000 mg | Freq: Once | INTRAVENOUS | Status: AC
  Administered 2014-04-22: 150 mg via INTRAVENOUS
  Filled 2014-04-22: qty 5

## 2014-04-22 MED ORDER — PALONOSETRON HCL INJECTION 0.25 MG/5ML
INTRAVENOUS | Status: AC
  Filled 2014-04-22: qty 5

## 2014-04-22 MED ORDER — SODIUM CHLORIDE 0.9 % IJ SOLN
10.0000 mL | INTRAMUSCULAR | Status: DC | PRN
  Administered 2014-04-22: 10 mL
  Filled 2014-04-22: qty 10

## 2014-04-22 MED ORDER — SODIUM CHLORIDE 0.9 % IV SOLN
600.0000 mg/m2 | Freq: Once | INTRAVENOUS | Status: AC
  Administered 2014-04-22: 1200 mg via INTRAVENOUS
  Filled 2014-04-22: qty 60

## 2014-04-22 MED ORDER — SODIUM CHLORIDE 0.9 % IV SOLN
Freq: Once | INTRAVENOUS | Status: AC
  Administered 2014-04-22: 10:00:00 via INTRAVENOUS

## 2014-04-22 MED ORDER — DEXAMETHASONE SODIUM PHOSPHATE 20 MG/5ML IJ SOLN
INTRAMUSCULAR | Status: AC
  Filled 2014-04-22: qty 5

## 2014-04-22 MED ORDER — HEPARIN SOD (PORK) LOCK FLUSH 100 UNIT/ML IV SOLN
500.0000 [IU] | Freq: Once | INTRAVENOUS | Status: AC | PRN
  Administered 2014-04-22: 500 [IU]
  Filled 2014-04-22: qty 5

## 2014-04-22 MED ORDER — PALONOSETRON HCL INJECTION 0.25 MG/5ML
0.2500 mg | Freq: Once | INTRAVENOUS | Status: AC
  Administered 2014-04-22: 0.25 mg via INTRAVENOUS

## 2014-04-22 MED ORDER — DEXAMETHASONE SODIUM PHOSPHATE 20 MG/5ML IJ SOLN
12.0000 mg | Freq: Once | INTRAMUSCULAR | Status: AC
  Administered 2014-04-22: 12 mg via INTRAVENOUS

## 2014-04-22 NOTE — Progress Notes (Signed)
[place]  Telephone:(336) [phone] Fax:(336) [phone]     ID: Alexandria Bradley OB: [birth year]  MR#: [id number]  GTX#:[id number]  PCP: Alexandria Kim, MD GYN:   SU: Alexandria Bradley OTHER MD: Alexandria Bradley, Alexandria Bradley, Alexandria Bradley  CHIEF COMPLAINT: Triple negative breast cancer  CURRENT TREATMENT: Adjuvant chemotherapy  BREAST CANCER HISTORY: From the original intake note of 02/16/2014:  Alexandria Bradley palpated a mass in her left breast early May and brought it immediately to her gynecologist attention. He set her up for bilateral diagnostic mammography and left ultrasonography at [facility] 02/07/2014. Mammography showed a 1.3 cm oval mass with spiculated margins in the left breast at the 11:00 position. This was palpable. Ultrasound showed a 1.1 cm tall her than wide mass with a microlobulated margins. He was hypoechoic. There were no abnormalities noted in the left axilla.  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 Lyndon Code at conference 02/16/2014), triple negative, with an MIB-1 of 50%.  The patient's subsequent history is as detailed below  INTERVAL HISTORY: Alexandria Bradley  returns today for followup of her breast cancer accompanied by her husband Alexandria Bradley. Today is day 1 cycle 3 of her Doxorubicin and Cyclophosphamide adjuvant chemotherapy.   She receives her treatment every 14 days with Neulasta given on day 2 for granulocyte support.    REVIEW OF SYSTEMS: Alexandria Bradley does well on the first 2 days. By the evening of the second day she is feeling a little groggy and then days 3 and 4 she's pretty much knocked out. We reviewed her medications today and it could be the Compazine that is doing this in she does not like the Ativan, it makes her feel bad and it doesn't help her sleep. We are stopping that and continuing Klonopin. Her constipation is well-controlled  on MiraLAX with Benefiber. She continues to work full-time, but does not have a lot of energy left lower for exercise. She developed a rash over her arms after sun exposure. She has mild sinus symptoms. Hot flashes are moderate. A detailed review of systems today was otherwise entirely stable   PAST MEDICAL HISTORY: Past Medical History  Diagnosis Date  . Thyroid disease   . Osteoporosis   . Psoriasis   . Breast cancer   . Arthritis   . Back pain   . Hypothyroidism   . Anxiety   . Breast cancer     left    PAST SURGICAL HISTORY: Past Surgical History  Procedure Laterality Date  . Cholecystectomy    . Tonsillectomy    . Tubal ligation    . Myomectomy    . Lipoma excision    . Portacath placement N/A 03/01/2014    Procedure: INSERTION PORT-A-CATH;  Surgeon: Alexandria Bookbinder, MD;  Location: [place];  Service: General;  Laterality: N/A;    FAMILY HISTORY Family History  Problem Relation Age of Onset  . Endometrial cancer Mother   . Lung cancer Father   . Brain cancer Father    the patient's father died at the age of 53 from what may have been metastatic lung cancer. The patient knows very little about the father's side of her family. Her mother is alive at age 71. She was recently diagnosed with endometrial cancer. The patient has one brother, 2 sisters. There is no history of breast or ovarian cancer in the family.  GYNECOLOGIC HISTORY:  Menarche age  63, first live birth age 31. The patient is GX P3. One child died shortly after birth. She stopped having periods approximately 2005. She did not use hormone replacement. 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 Her husband Alexandria Bradley is a Insurance underwriter. Son Alexandria Bradley is that she Nurse, adult in [place]. Daughter Alexandria Bradley lives in [place] and works for [employer] as an Therapist, sports.    ADVANCED  DIRECTIVES: Not in place   HEALTH MAINTENANCE: History  Substance Use Topics  . Smoking status: Former Research scientist (life sciences)  . Smokeless tobacco: Not on file  . Alcohol Use: Yes     Comment: social     Colonoscopy: 2010  PAP: 2013  Bone density: 05/15/2009, at [place], normal, lowest T score -0.8  Lipid panel:  Allergies  Allergen Reactions  . Prednisone Shortness Of Breath    "jacks me up" jittery  . Codeine Nausea Only    Nausea   . Cortisone Swelling    "jack me up"    Current Outpatient Prescriptions  Medication Sig Dispense Refill  . acetaminophen (TYLENOL) 500 MG tablet Take 500 mg by mouth every 6 (six) hours as needed for headache.      Marland Kitchen amLODipine (NORVASC) 5 MG tablet       . ciprofloxacin (CIPRO) 500 MG tablet Take 1 tablet (500 mg total) by mouth 2 (two) times daily.  14 tablet  0  . clonazePAM (KLONOPIN) 0.5 MG tablet Take 0.5 mg by mouth 2 (two) times daily as needed for anxiety.      Marland Kitchen dexamethasone (DECADRON) 4 MG tablet Take 2 tablets by mouth once a day on the day after chemotherapy and then take 2 tablets two times a day for 2 days. Take with food.  30 tablet  1  . HYDROcodone-acetaminophen (HYCET) 7.5-325 mg/15 ml solution Take 10 mLs by mouth 4 (four) times daily as needed for moderate pain.  120 mL  0  . lidocaine-prilocaine (EMLA) cream Apply 1 application topically as needed. Apply numbing cream over port site 1-2 hours before chemo and cover with plastic wrap  30 g  0  . liothyronine (CYTOMEL) 5 MCG tablet       . loratadine (CLARITIN) 10 MG tablet Take 10 mg by mouth daily.      Marland Kitchen LORazepam (ATIVAN) 0.5 MG tablet Take 1 tablet (0.5 mg total) by mouth at bedtime as needed (Nausea or vomiting).  30 tablet  0  . ondansetron (ZOFRAN ODT) 4 MG disintegrating tablet Take 1 tablet (4 mg total) by mouth every 8 (eight) hours as needed for nausea or vomiting.  20 tablet  0  . ondansetron (ZOFRAN) 8 MG tablet Take 1 tablet (8 mg total) by mouth 2 (two) times daily as  needed. Start on the third day after chemotherapy.  30 tablet  1  . prochlorperazine (COMPAZINE) 10 MG tablet Take 1 tablet (10 mg total) by mouth every 6 (six) hours as needed (Nausea or vomiting).  30 tablet  1  . SYNTHROID 137 MCG tablet       . triamterene-hydrochlorothiazide (MAXZIDE-25) 37.5-25 MG per tablet        No current facility-administered medications for this visit.    OBJECTIVE: Middle-aged white woman in no acute distress  Filed Vitals:   04/22/14 0852  BP: 135/76  Pulse: 79  Temp: 98.2 F (36.8 C)  Resp: 20  Body mass index is 32.47 kg/(m^2).    ECOG FS:1 - Symptomatic but completely ambulatory  Sclerae unicteric, pupils equal and reactive Oropharynx clear and moist-- no thrush No cervical or supraclavicular adenopathy Lungs no rales or rhonchi Heart regular rate and rhythm Abd soft, nontender, positive bowel sounds MSK no focal spinal tenderness, no upper extremity lymphedema Neuro: nonfocal, well oriented, appropriate affect Breasts: Deferred   LAB RESULTS:  CMP     Component Value Date/Time   NA 137 04/15/2014 1326   NA 140 03/13/2009 2325   K 3.6 04/15/2014 1326   K 3.7 03/13/2009 2325   CL 105 03/13/2009 2325   CO2 30* 04/15/2014 1326   CO2 29 03/13/2009 2325   GLUCOSE 93 04/15/2014 1326   GLUCOSE 107* 03/13/2009 2325   GLUCOSE 88 09/22/2006 1056   BUN 12.2 04/15/2014 1326   BUN 12 03/13/2009 2325   CREATININE 0.6 04/15/2014 1326   CREATININE 0.76 03/13/2009 2325   CALCIUM 9.5 04/15/2014 1326   CALCIUM 9.4 03/13/2009 2325   PROT 6.5 04/15/2014 1326   PROT 7.1 03/13/2009 2325   ALBUMIN 3.7 04/15/2014 1326   ALBUMIN 4.1 03/13/2009 2325   AST 13 04/15/2014 1326   AST 136* 03/13/2009 2325   ALT 39 04/15/2014 1326   ALT 303* 03/13/2009 2325   ALKPHOS 77 04/15/2014 1326   ALKPHOS 131* 03/13/2009 2325   BILITOT 0.27 04/15/2014 1326   BILITOT 0.5 03/13/2009 2325   GFRNONAA >60 03/13/2009 2325   GFRAA  Value: >60        The eGFR has been calculated using the MDRD  equation. This calculation has not been validated in all clinical situations. eGFR's persistently <60 mL/min signify possible Chronic Kidney Disease. 03/13/2009 2325    I No results found for this basename: SPEP,  UPEP,   kappa and lambda light chains    Lab Results  Component Value Date   WBC 5.7 04/22/2014   NEUTROABS 4.1 04/22/2014   HGB 11.4* 04/22/2014   HCT 34.6* 04/22/2014   MCV 93.9 04/22/2014   PLT 201 04/22/2014      Chemistry      Component Value Date/Time   NA 137 04/15/2014 1326   NA 140 03/13/2009 2325   K 3.6 04/15/2014 1326   K 3.7 03/13/2009 2325   CL 105 03/13/2009 2325   CO2 30* 04/15/2014 1326   CO2 29 03/13/2009 2325   BUN 12.2 04/15/2014 1326   BUN 12 03/13/2009 2325   CREATININE 0.6 04/15/2014 1326   CREATININE 0.76 03/13/2009 2325      Component Value Date/Time   CALCIUM 9.5 04/15/2014 1326   CALCIUM 9.4 03/13/2009 2325   ALKPHOS 77 04/15/2014 1326   ALKPHOS 131* 03/13/2009 2325   AST 13 04/15/2014 1326   AST 136* 03/13/2009 2325   ALT 39 04/15/2014 1326   ALT 303* 03/13/2009 2325   BILITOT 0.27 04/15/2014 1326   BILITOT 0.5 03/13/2009 2325       No results found for this basename: LABCA2    No components found with this basename: LABCA125    No results found for this basename: INR,  in the last 168 hours  Urinalysis    Component Value Date/Time   COLORURINE YELLOW 03/13/2009 2251   APPEARANCEUR CLOUDY* 03/13/2009 2251   LABSPEC 1.018 03/13/2009 2251   PHURINE 5.5 03/13/2009 [place] 03/13/2009 2251   HGBUR NEGATIVE 03/13/2009 [place] NEGATIVE 03/13/2009 2251   KETONESUR NEGATIVE 03/13/2009 2251  PROTEINUR NEGATIVE 03/13/2009 2251   UROBILINOGEN 0.2 03/13/2009 2251   NITRITE NEGATIVE 03/13/2009 2251   LEUKOCYTESUR SMALL* 03/13/2009 2251    STUDIES: No results found.  ASSESSMENT: 53 y.o. [place] woman status post left breast biopsy 02/08/2014 for a clinical T1c N0, stage IA invasive ductal carcinoma, grade 3, triple negative,  with an MIB-1 of 50%.  (1) status post left lumpectomy and sentinel lymph node sampling 03/01/2014 for a pT1c pN0, stage IA invasive ductal carcinoma, grade 3, with negative margins, repeat prognostic panel again triple negative.  (2) adjuvant chemotherapy started a 03/25/2014 consisting of cyclophosphamide and doxorubicin given in dose dense fashion x4, with Neulasta support on day 2; to be followed by weekly Taxol either weekly or every 2 weeks, as the patient may choose at that time  (3) adjuvant radiation to follow chemotherapy  PLAN: Bethsaida is doing well with her chemotherapy. She has very low nadirs, and we are covering with Cipro, which so far has worked well. The biggest issue is days 3 and 4, when she is "groggy and knocked out". This possibly could be due to her Compazine. Since she has not been having any nausea symptoms, we are going to make the Compazine "as needed" and see if that makes a difference.  She doesn't like the Ativan, it doesn't really help her sleep but it makes her feel bad in some way. We're going to just use her usual Klonopin for sleep. Also I have advised her to avoid sun exposure until she is at least a month off chemotherapy.  Otherwise she is managing her symptoms well. She has pretty much decided that she wants to Taxol to be given on a weekly basis. We will set her up for that after her fourth cycle of her current treatment  She knows to call for any problems that may develop before her next visit here   Chauncey Cruel, MD  04/22/2014 9:01 AM

## 2014-04-22 NOTE — Addendum Note (Signed)
Addended by: Amelia Jo I on: 04/22/2014 01:50 PM   Modules accepted: Medications

## 2014-04-22 NOTE — Patient Instructions (Signed)
[facility] Discharge Instructions for Patients Receiving Chemotherapy  Today you received the following chemotherapy agents Adriamycin and Cytoxan.  To help prevent nausea and vomiting after your treatment, we encourage you to take your nausea medication as prescribed.   If you develop nausea and vomiting that is not controlled by your nausea medication, call the clinic.   BELOW ARE SYMPTOMS THAT SHOULD BE REPORTED IMMEDIATELY:  *FEVER GREATER THAN 100.5 F  *CHILLS WITH OR WITHOUT FEVER  NAUSEA AND VOMITING THAT IS NOT CONTROLLED WITH YOUR NAUSEA MEDICATION  *UNUSUAL SHORTNESS OF BREATH  *UNUSUAL BRUISING OR BLEEDING  TENDERNESS IN MOUTH AND THROAT WITH OR WITHOUT PRESENCE OF ULCERS  *URINARY PROBLEMS  *BOWEL PROBLEMS  UNUSUAL RASH Items with * indicate a potential emergency and should be followed up as soon as possible.  Feel free to call the clinic you have any questions or concerns. The clinic phone number is [phone].    

## 2014-04-22 NOTE — Telephone Encounter (Signed)
cld and adv pt of added appt-will print & mail copy

## 2014-04-23 ENCOUNTER — Ambulatory Visit (HOSPITAL_BASED_OUTPATIENT_CLINIC_OR_DEPARTMENT_OTHER): Payer: 59

## 2014-04-23 VITALS — BP 119/72 | HR 86 | Temp 98.0°F | Resp 18

## 2014-04-23 DIAGNOSIS — Z5189 Encounter for other specified aftercare: Secondary | ICD-10-CM

## 2014-04-23 DIAGNOSIS — C50219 Malignant neoplasm of upper-inner quadrant of unspecified female breast: Secondary | ICD-10-CM

## 2014-04-23 DIAGNOSIS — C50212 Malignant neoplasm of upper-inner quadrant of left female breast: Secondary | ICD-10-CM

## 2014-04-23 MED ORDER — PEGFILGRASTIM INJECTION 6 MG/0.6ML
6.0000 mg | Freq: Once | SUBCUTANEOUS | Status: AC
  Administered 2014-04-23: 6 mg via SUBCUTANEOUS

## 2014-04-23 NOTE — Progress Notes (Signed)
Denies need for Emla cream use or Emla refill today.

## 2014-04-29 ENCOUNTER — Other Ambulatory Visit: Payer: 59

## 2014-04-29 ENCOUNTER — Ambulatory Visit (HOSPITAL_BASED_OUTPATIENT_CLINIC_OR_DEPARTMENT_OTHER): Payer: 59 | Admitting: Adult Health

## 2014-04-29 ENCOUNTER — Encounter: Payer: Self-pay | Admitting: Adult Health

## 2014-04-29 ENCOUNTER — Other Ambulatory Visit (HOSPITAL_BASED_OUTPATIENT_CLINIC_OR_DEPARTMENT_OTHER): Payer: 59

## 2014-04-29 VITALS — BP 131/77 | HR 98 | Temp 99.2°F | Resp 18 | Ht 65.0 in | Wt 194.0 lb

## 2014-04-29 DIAGNOSIS — Z171 Estrogen receptor negative status [ER-]: Secondary | ICD-10-CM

## 2014-04-29 DIAGNOSIS — C50212 Malignant neoplasm of upper-inner quadrant of left female breast: Secondary | ICD-10-CM

## 2014-04-29 DIAGNOSIS — C50219 Malignant neoplasm of upper-inner quadrant of unspecified female breast: Secondary | ICD-10-CM

## 2014-04-29 DIAGNOSIS — D702 Other drug-induced agranulocytosis: Secondary | ICD-10-CM

## 2014-04-29 LAB — CBC WITH DIFFERENTIAL/PLATELET
BASO%: 1.7 % (ref 0.0–2.0)
BASOS ABS: 0 10*3/uL (ref 0.0–0.1)
EOS ABS: 0 10*3/uL (ref 0.0–0.5)
EOS%: 6.9 % (ref 0.0–7.0)
HCT: 31.6 % — ABNORMAL LOW (ref 34.8–46.6)
HEMOGLOBIN: 10.5 g/dL — AB (ref 11.6–15.9)
LYMPH%: 51.7 % — ABNORMAL HIGH (ref 14.0–49.7)
MCH: 30.6 pg (ref 25.1–34.0)
MCHC: 33.2 g/dL (ref 31.5–36.0)
MCV: 92.1 fL (ref 79.5–101.0)
MONO#: 0.1 10*3/uL (ref 0.1–0.9)
MONO%: 19 % — ABNORMAL HIGH (ref 0.0–14.0)
NEUT%: 20.7 % — ABNORMAL LOW (ref 38.4–76.8)
NEUTROS ABS: 0.1 10*3/uL — AB (ref 1.5–6.5)
PLATELETS: 177 10*3/uL (ref 145–400)
RBC: 3.43 10*6/uL — ABNORMAL LOW (ref 3.70–5.45)
RDW: 13.7 % (ref 11.2–14.5)
WBC: 0.6 10*3/uL — AB (ref 3.9–10.3)
lymph#: 0.3 10*3/uL — ABNORMAL LOW (ref 0.9–3.3)
nRBC: 0 % (ref 0–0)

## 2014-04-29 LAB — COMPREHENSIVE METABOLIC PANEL (CC13)
ALT: 263 U/L — ABNORMAL HIGH (ref 0–55)
ANION GAP: 11 meq/L (ref 3–11)
AST: 179 U/L (ref 5–34)
Albumin: 3.9 g/dL (ref 3.5–5.0)
Alkaline Phosphatase: 110 U/L (ref 40–150)
BUN: 9.3 mg/dL (ref 7.0–26.0)
CALCIUM: 9.5 mg/dL (ref 8.4–10.4)
CHLORIDE: 96 meq/L — AB (ref 98–109)
CO2: 28 meq/L (ref 22–29)
Creatinine: 0.7 mg/dL (ref 0.6–1.1)
Glucose: 134 mg/dl (ref 70–140)
POTASSIUM: 3.4 meq/L — AB (ref 3.5–5.1)
SODIUM: 135 meq/L — AB (ref 136–145)
TOTAL PROTEIN: 6.9 g/dL (ref 6.4–8.3)
Total Bilirubin: 0.6 mg/dL (ref 0.20–1.20)

## 2014-04-29 NOTE — Progress Notes (Signed)
[place]  Telephone:(336) [phone] Fax:(336) [phone]     ID: [name] OB: 1961-01-24  MR#: [id number]  RDE#:[id number]  PCP: Lovenia Kim, MD GYN:   SU: [name] OTHER MD: [name], Delrae Rend, Wilford Corner  CHIEF COMPLAINT: Triple negative breast cancer  CURRENT TREATMENT: Adjuvant chemotherapy  BREAST CANCER HISTORY: From the original intake note of 02/16/2014:  [name] palpated a mass in her left breast early May and brought it immediately to her gynecologist attention. He set her up for bilateral diagnostic mammography and left ultrasonography at [facility] 02/07/2014. Mammography showed a 1.3 cm oval mass with spiculated margins in the left breast at the 11:00 position. This was palpable. Ultrasound showed a 1.1 cm tall her than wide mass with a microlobulated margins. He was hypoechoic. There were no abnormalities noted in the left axilla.  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 Lyndon Code at conference 02/16/2014), triple negative, with an MIB-1 of 50%.  The patient's subsequent history is as detailed below  INTERVAL HISTORY: Trenae  returns today for followup of her breast cancer accompanied by her husband [name]. Today is day 8 cycle 3 of her Doxorubicin and Cyclophosphamide adjuvant chemotherapy.   She receives her treatment every 14 days with Neulasta given on day 2 for granulocyte support.    Josalin is doing moderately well today.  She is here for evaluation following her third [place] treatment.  She is progressively fatigued.  She developed diarrhea that started this past Tuesday 7/28 and resolved today.  She did take Imodium for this and it did help.  She had accompanying stomach pain, which has now resolved.  Eating does help her stomach feel better.  She has not had any nausea with this.  She denies fevers,  chills, nausea, vomiting, constipation, numbness/tingling, or any further concerns.    REVIEW OF SYSTEMS: A 10 point review of systems was conducted and is otherwise negative except for what is noted above.      PAST MEDICAL HISTORY: Past Medical History  Diagnosis Date  . Thyroid disease   . Osteoporosis   . Psoriasis   . Breast cancer   . Arthritis   . Back pain   . Hypothyroidism   . Anxiety   . Breast cancer     left    PAST SURGICAL HISTORY: Past Surgical History  Procedure Laterality Date  . Cholecystectomy    . Tonsillectomy    . Tubal ligation    . Myomectomy    . Lipoma excision    . Portacath placement N/A 03/01/2014    Procedure: INSERTION PORT-A-CATH;  Surgeon: [name], MD;  Location: [facility];  Service: General;  Laterality: N/A;    FAMILY HISTORY Family History  Problem Relation Age of Onset  . Endometrial cancer Mother   . Lung cancer Father   . Brain cancer Father    the patient's father died at the age of 93 from what may have been metastatic lung cancer. The patient knows very little about the father's side of her family. Her mother is alive at age 41. She was recently diagnosed with endometrial cancer. The patient has one brother, 2 sisters. There is no history of breast or ovarian cancer in the family.  GYNECOLOGIC HISTORY:  Menarche age 49, first live birth age 40. The patient is GX P3. One child died shortly after  birth. She stopped having periods approximately 2005. She did not use hormone replacement. 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 Her husband [name] is a Insurance underwriter. Son [name] is that she Nurse, adult in [place]. Daughter [name] lives in [place] and works for [employer] as an Therapist, sports.    ADVANCED DIRECTIVES: Not in place   HEALTH MAINTENANCE: History  Substance Use Topics   . Smoking status: Former Research scientist (life sciences)  . Smokeless tobacco: Not on file  . Alcohol Use: Yes     Comment: social     Colonoscopy: 2010  PAP: 2013  Bone density: 05/15/2009, at [place], normal, lowest T score -0.8  Lipid panel:  Allergies  Allergen Reactions  . Prednisone Shortness Of Breath    "jacks me up" jittery  . Codeine Nausea Only    Nausea   . Cortisone Swelling    "jack me up"    Current Outpatient Prescriptions  Medication Sig Dispense Refill  . acetaminophen (TYLENOL) 500 MG tablet Take 500 mg by mouth every 6 (six) hours as needed for headache.      Marland Kitchen amLODipine (NORVASC) 5 MG tablet       . clonazePAM (KLONOPIN) 0.5 MG tablet Take 0.5 mg by mouth 2 (two) times daily as needed for anxiety.      Marland Kitchen dexamethasone (DECADRON) 4 MG tablet Take 2 tablets by mouth once a day on the day after chemotherapy and then take 2 tablets two times a day for 2 days. Take with food.  30 tablet  1  . lidocaine-prilocaine (EMLA) cream Apply 1 application topically as needed. Apply numbing cream over port site 1-2 hours before chemo and cover with plastic wrap  30 g  0  . liothyronine (CYTOMEL) 5 MCG tablet       . loratadine (CLARITIN) 10 MG tablet Take 10 mg by mouth daily.      . ondansetron (ZOFRAN ODT) 4 MG disintegrating tablet Take 1 tablet (4 mg total) by mouth every 8 (eight) hours as needed for nausea or vomiting.  20 tablet  0  . ondansetron (ZOFRAN) 8 MG tablet Take 1 tablet (8 mg total) by mouth 2 (two) times daily as needed. Start on the third day after chemotherapy.  30 tablet  1  . prochlorperazine (COMPAZINE) 10 MG tablet Take 1 tablet (10 mg total) by mouth every 6 (six) hours as needed (Nausea or vomiting).  30 tablet  1  . SYNTHROID 137 MCG tablet       . triamterene-hydrochlorothiazide (MAXZIDE-25) 37.5-25 MG per tablet       . ciprofloxacin (CIPRO) 500 MG tablet Take 1 tablet (500 mg total) by mouth 2 (two) times daily.  14 tablet  0  . HYDROcodone-acetaminophen  (HYCET) 7.5-325 mg/15 ml solution Take 10 mLs by mouth 4 (four) times daily as needed for moderate pain.  120 mL  0  . LORazepam (ATIVAN) 0.5 MG tablet Take 1 tablet (0.5 mg total) by mouth at bedtime as needed (Nausea or vomiting).  30 tablet  0   No current facility-administered medications for this visit.    OBJECTIVE: Middle-aged white woman in no acute distress  Filed Vitals:   04/29/14 1302  BP: 131/77  Pulse: 98  Temp: 99.2 F (37.3 C)  Resp: 18     Body mass index is 32.28 kg/(m^2).    ECOG FS:1 -  Symptomatic but completely ambulatory  GENERAL: Patient is a well appearing female in no acute distress HEENT:  Sclerae anicteric.  Oropharynx clear and moist. No ulcerations or evidence of oropharyngeal candidiasis. Neck is supple.  NODES:  No cervical, supraclavicular, or axillary lymphadenopathy palpated.  BREAST EXAM:  Deferred. LUNGS:  Clear to auscultation bilaterally.  No wheezes or rhonchi. HEART:  Regular rate and rhythm. No murmur appreciated. ABDOMEN:  Soft, nontender.  Positive, normoactive bowel sounds. No organomegaly palpated. MSK:  No focal spinal tenderness to palpation. Full range of motion bilaterally in the upper extremities. EXTREMITIES:  No peripheral edema.   SKIN:  Clear with no obvious rashes or skin changes. No nail dyscrasia. NEURO:  Nonfocal. Well oriented.  Appropriate affect.  LAB RESULTS:  CMP     Component Value Date/Time   NA 135* 04/29/2014 1249   NA 140 03/13/2009 2325   K 3.4* 04/29/2014 1249   K 3.7 03/13/2009 2325   CL 105 03/13/2009 2325   CO2 28 04/29/2014 1249   CO2 29 03/13/2009 2325   GLUCOSE 134 04/29/2014 1249   GLUCOSE 107* 03/13/2009 2325   GLUCOSE 88 09/22/2006 1056   BUN 9.3 04/29/2014 1249   BUN 12 03/13/2009 2325   CREATININE 0.7 04/29/2014 1249   CREATININE 0.76 03/13/2009 2325   CALCIUM 9.5 04/29/2014 1249   CALCIUM 9.4 03/13/2009 2325   PROT 6.9 04/29/2014 1249   PROT 7.1 03/13/2009 2325   ALBUMIN 3.9 04/29/2014 1249   ALBUMIN  4.1 03/13/2009 2325   AST 179* 04/29/2014 1249   AST 136* 03/13/2009 2325   ALT 263* 04/29/2014 1249   ALT 303* 03/13/2009 2325   ALKPHOS 110 04/29/2014 1249   ALKPHOS 131* 03/13/2009 2325   BILITOT 0.60 04/29/2014 1249   BILITOT 0.5 03/13/2009 2325   GFRNONAA >60 03/13/2009 2325   GFRAA  Value: >60        The eGFR has been calculated using the MDRD equation. This calculation has not been validated in all clinical situations. eGFR's persistently <60 mL/min signify possible Chronic Kidney Disease. 03/13/2009 2325    I No results found for this basename: SPEP,  UPEP,   kappa and lambda light chains    Lab Results  Component Value Date   WBC 0.6* 04/29/2014   NEUTROABS 0.1* 04/29/2014   HGB 10.5* 04/29/2014   HCT 31.6* 04/29/2014   MCV 92.1 04/29/2014   PLT 177 04/29/2014      Chemistry      Component Value Date/Time   NA 135* 04/29/2014 1249   NA 140 03/13/2009 2325   K 3.4* 04/29/2014 1249   K 3.7 03/13/2009 2325   CL 105 03/13/2009 2325   CO2 28 04/29/2014 1249   CO2 29 03/13/2009 2325   BUN 9.3 04/29/2014 1249   BUN 12 03/13/2009 2325   CREATININE 0.7 04/29/2014 1249   CREATININE 0.76 03/13/2009 2325      Component Value Date/Time   CALCIUM 9.5 04/29/2014 1249   CALCIUM 9.4 03/13/2009 2325   ALKPHOS 110 04/29/2014 1249   ALKPHOS 131* 03/13/2009 2325   AST 179* 04/29/2014 1249   AST 136* 03/13/2009 2325   ALT 263* 04/29/2014 1249   ALT 303* 03/13/2009 2325   BILITOT 0.60 04/29/2014 1249   BILITOT 0.5 03/13/2009 2325       No results found for this basename: LABCA2    No components found with this basename: [id number]    No results found for this basename: INR,  in the  last 168 hours  Urinalysis    Component Value Date/Time   COLORURINE YELLOW 03/13/2009 2251   APPEARANCEUR CLOUDY* 03/13/2009 2251   LABSPEC 1.018 03/13/2009 2251   PHURINE 5.5 03/13/2009 2251   GLUCOSEU NEGATIVE 03/13/2009 2251   HGBUR NEGATIVE 03/13/2009 2251   BILIRUBINUR NEGATIVE 03/13/2009 2251   KETONESUR NEGATIVE  03/13/2009 2251   PROTEINUR NEGATIVE 03/13/2009 2251   UROBILINOGEN 0.2 03/13/2009 2251   NITRITE NEGATIVE 03/13/2009 2251   LEUKOCYTESUR SMALL* 03/13/2009 2251    STUDIES: No results found.  ASSESSMENT: 53 y.o. [place] woman status post left breast biopsy 02/08/2014 for a clinical T1c N0, stage IA invasive ductal carcinoma, grade 3, triple negative, with an MIB-1 of 50%.  (1) status post left lumpectomy and sentinel lymph node sampling 03/01/2014 for a pT1c pN0, stage IA invasive ductal carcinoma, grade 3, with negative margins, repeat prognostic panel again triple negative.  (2) adjuvant chemotherapy started a 03/25/2014 consisting of cyclophosphamide and doxorubicin given in dose dense fashion x4, with Neulasta support on day 2; to be followed by weekly Taxol either weekly or every 2 weeks, as the patient may choose at that time  (3) adjuvant radiation to follow chemotherapy  PLAN: Lavelle is doing moderately well today.  She tolerated chemotherapy well.  I reviewed her CBC with her in detail.  Her CMP is pending.  She is subsequently neutropenic following chemotherapy.  Her anc is 100.  She will start back on Cipro PO BID and take this for one week.  I gave her detailed neutropenic instructions in her AVS.    Lalonnie will return in one week for labs and evaluation along with her fourth cycle of Doxorubicin and Cyclophosphamide.  She knows to call us in the interim for any questions or concerns.  We can certainly see her sooner if needed.   I spent 25 minutes counseling the patient face to face.  The total time spent in the appointment was 30 minutes.  Minette Headland, [place] [phone] 04/30/2014 4:45 PM

## 2014-04-29 NOTE — Patient Instructions (Signed)
  Patient Neutropenia Instruction Sheet  Diagnosis: Breast Cancer      Treating Physician: Dr. Jana Hakim  Treatment: 1. Type of chemotherapy: Doxorubicin and Cylophosphamide 2. Date of last treatment: 04/22/14  Last Blood Counts: Lab Results  Component Value Date   WBC 0.6* 04/29/2014   HGB 10.5* 04/29/2014   HCT 31.6* 04/29/2014   MCV 92.1 04/29/2014   PLT 177 04/29/2014  ANC 100     Prophylactic Antibiotics: Cipro 500 mg by mouth twice a day Instructions: 1. Monitor temperature and call if fever  greater than 100.5, chills, shaking chills (rigors) 2. Call Physician on-call at [phone] 3. Give him/her symptoms and list of medications that you are taking and your last blood count.

## 2014-04-30 MED ORDER — CIPROFLOXACIN HCL 500 MG PO TABS: 500.0000 mg | ORAL_TABLET | Freq: Two times a day (BID) | ORAL | Status: DC

## 2014-05-02 ENCOUNTER — Telehealth: Payer: Self-pay | Admitting: *Deleted

## 2014-05-02 MED ORDER — MAGIC MOUTHWASH W/LIDOCAINE: ORAL | Status: DC

## 2014-05-02 NOTE — Telephone Encounter (Signed)
Pt called to this RN to state noted onset post starting Cipro - " of my heart racing - this happened the first time I took it but I forgot about it "  " this time it didn't get better and I woke up in a panic on Saturday night "  " then I noticed I had a rash on the back of my hands - then on Sunday the rash is on my legs "  " didn't take any more Cipro on Sunday "  Per discussion pt denies any SOB or swallowing issues.  Rash is not irritating.  Noted pt is sensitive to prednisone and cortisone.  Alexandria Bradley also stated " I have one area in the back of my throat that is an ulcer and my throat is sore when I swallow "  Plan per phone call is pt will use benadryl for any discomfort from the rash.  Magic mouthwash will be called in to pt's pharmacy with formula that does not contain any steroids.  Pt understands to call the office if she develops temperature greater then 100.5.

## 2014-05-06 ENCOUNTER — Encounter: Payer: Self-pay | Admitting: Adult Health

## 2014-05-06 ENCOUNTER — Other Ambulatory Visit (HOSPITAL_BASED_OUTPATIENT_CLINIC_OR_DEPARTMENT_OTHER): Payer: 59

## 2014-05-06 ENCOUNTER — Ambulatory Visit (HOSPITAL_BASED_OUTPATIENT_CLINIC_OR_DEPARTMENT_OTHER): Payer: 59 | Admitting: Adult Health

## 2014-05-06 ENCOUNTER — Ambulatory Visit (HOSPITAL_BASED_OUTPATIENT_CLINIC_OR_DEPARTMENT_OTHER): Payer: 59

## 2014-05-06 VITALS — BP 138/75 | HR 86 | Temp 98.2°F | Resp 18 | Ht 65.0 in | Wt 193.3 lb

## 2014-05-06 DIAGNOSIS — C50212 Malignant neoplasm of upper-inner quadrant of left female breast: Secondary | ICD-10-CM

## 2014-05-06 DIAGNOSIS — Z5111 Encounter for antineoplastic chemotherapy: Secondary | ICD-10-CM

## 2014-05-06 DIAGNOSIS — C50219 Malignant neoplasm of upper-inner quadrant of unspecified female breast: Secondary | ICD-10-CM

## 2014-05-06 DIAGNOSIS — Z171 Estrogen receptor negative status [ER-]: Secondary | ICD-10-CM

## 2014-05-06 DIAGNOSIS — R002 Palpitations: Secondary | ICD-10-CM

## 2014-05-06 DIAGNOSIS — R21 Rash and other nonspecific skin eruption: Secondary | ICD-10-CM

## 2014-05-06 LAB — COMPREHENSIVE METABOLIC PANEL (CC13)
ALBUMIN: 4.1 g/dL (ref 3.5–5.0)
ALT: 70 U/L — AB (ref 0–55)
AST: 25 U/L (ref 5–34)
Alkaline Phosphatase: 80 U/L (ref 40–150)
Anion Gap: 15 mEq/L — ABNORMAL HIGH (ref 3–11)
BUN: 11.7 mg/dL (ref 7.0–26.0)
CHLORIDE: 106 meq/L (ref 98–109)
CO2: 23 meq/L (ref 22–29)
Calcium: 10.1 mg/dL (ref 8.4–10.4)
Creatinine: 0.8 mg/dL (ref 0.6–1.1)
Glucose: 166 mg/dl — ABNORMAL HIGH (ref 70–140)
POTASSIUM: 3.4 meq/L — AB (ref 3.5–5.1)
Sodium: 143 mEq/L (ref 136–145)
Total Bilirubin: 0.37 mg/dL (ref 0.20–1.20)
Total Protein: 7.2 g/dL (ref 6.4–8.3)

## 2014-05-06 LAB — CBC WITH DIFFERENTIAL/PLATELET
BASO%: 1.3 % (ref 0.0–2.0)
BASOS ABS: 0.1 10*3/uL (ref 0.0–0.1)
EOS%: 0.4 % (ref 0.0–7.0)
Eosinophils Absolute: 0 10*3/uL (ref 0.0–0.5)
HEMATOCRIT: 33.2 % — AB (ref 34.8–46.6)
HEMOGLOBIN: 11.1 g/dL — AB (ref 11.6–15.9)
LYMPH#: 0.7 10*3/uL — AB (ref 0.9–3.3)
LYMPH%: 19 % (ref 14.0–49.7)
MCH: 31.4 pg (ref 25.1–34.0)
MCHC: 33.3 g/dL (ref 31.5–36.0)
MCV: 94.2 fL (ref 79.5–101.0)
MONO#: 0.4 10*3/uL (ref 0.1–0.9)
MONO%: 10.6 % (ref 0.0–14.0)
NEUT#: 2.7 10*3/uL (ref 1.5–6.5)
NEUT%: 68.7 % (ref 38.4–76.8)
Platelets: 255 10*3/uL (ref 145–400)
RBC: 3.53 10*6/uL — ABNORMAL LOW (ref 3.70–5.45)
RDW: 14.7 % — AB (ref 11.2–14.5)
WBC: 3.9 10*3/uL (ref 3.9–10.3)

## 2014-05-06 MED ORDER — DEXAMETHASONE SODIUM PHOSPHATE 20 MG/5ML IJ SOLN
12.0000 mg | Freq: Once | INTRAMUSCULAR | Status: AC
  Administered 2014-05-06: 12 mg via INTRAVENOUS

## 2014-05-06 MED ORDER — FOSAPREPITANT DIMEGLUMINE INJECTION 150 MG
150.0000 mg | Freq: Once | INTRAVENOUS | Status: AC
  Administered 2014-05-06: 150 mg via INTRAVENOUS
  Filled 2014-05-06: qty 5

## 2014-05-06 MED ORDER — PALONOSETRON HCL INJECTION 0.25 MG/5ML
INTRAVENOUS | Status: AC
  Filled 2014-05-06: qty 5

## 2014-05-06 MED ORDER — HEPARIN SOD (PORK) LOCK FLUSH 100 UNIT/ML IV SOLN
500.0000 [IU] | Freq: Once | INTRAVENOUS | Status: AC | PRN
  Administered 2014-05-06: 500 [IU]
  Filled 2014-05-06: qty 5

## 2014-05-06 MED ORDER — DOXORUBICIN HCL CHEMO IV INJECTION 2 MG/ML
60.0000 mg/m2 | Freq: Once | INTRAVENOUS | Status: AC
  Administered 2014-05-06: 120 mg via INTRAVENOUS
  Filled 2014-05-06: qty 60

## 2014-05-06 MED ORDER — DEXAMETHASONE SODIUM PHOSPHATE 20 MG/5ML IJ SOLN
INTRAMUSCULAR | Status: AC
  Filled 2014-05-06: qty 5

## 2014-05-06 MED ORDER — PALONOSETRON HCL INJECTION 0.25 MG/5ML
0.2500 mg | Freq: Once | INTRAVENOUS | Status: AC
  Administered 2014-05-06: 0.25 mg via INTRAVENOUS

## 2014-05-06 MED ORDER — SODIUM CHLORIDE 0.9 % IV SOLN
600.0000 mg/m2 | Freq: Once | INTRAVENOUS | Status: AC
  Administered 2014-05-06: 1200 mg via INTRAVENOUS
  Filled 2014-05-06: qty 60

## 2014-05-06 MED ORDER — SODIUM CHLORIDE 0.9 % IJ SOLN
10.0000 mL | INTRAMUSCULAR | Status: DC | PRN
  Administered 2014-05-06: 10 mL
  Filled 2014-05-06: qty 10

## 2014-05-06 MED ORDER — SODIUM CHLORIDE 0.9 % IV SOLN
Freq: Once | INTRAVENOUS | Status: AC
  Administered 2014-05-06: 11:00:00 via INTRAVENOUS

## 2014-05-06 NOTE — Progress Notes (Signed)
[place]  [phone] [phone]) [phone]     ID: Alexandria Bradley OB: 07/30/1961  MR#: [id number]  GDJ#:[id number]  PCP: Lovenia Kim, MD GYN:   SU: [name] OTHER MD: [name], Delrae Rend, Wilford Corner  CHIEF COMPLAINT: Triple negative breast cancer  CURRENT TREATMENT: Adjuvant chemotherapy  BREAST CANCER HISTORY: From the original intake note of 02/16/2014:  Alexandria Bradley palpated a mass in her left breast early May and brought it immediately to her gynecologist attention. He set her up for bilateral diagnostic mammography and left ultrasonography at [facility] 02/07/2014. Mammography showed a 1.3 cm oval mass with spiculated margins in the left breast at the 11:00 position. This was palpable. Ultrasound showed a 1.1 cm tall her than wide mass with a microlobulated margins. He was hypoechoic. There were no abnormalities noted in the left axilla.  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 Lyndon Code at conference 02/16/2014), triple negative, with an MIB-1 of 50%.  The patient's subsequent history is as detailed below  INTERVAL HISTORY: Alexandria Bradley  returns today for followup of her breast cancer accompanied by her husband [name]. Today is day 1 cycle 4 of her Doxorubicin and Cyclophosphamide adjuvant chemotherapy.   She receives her treatment every 14 days with Neulasta given on day 2 for granulocyte support.    Alexandria Bradley is doing well today.  She was neutropenic last week and started taking Cipro on Friday, July, 31.  She started experiencing palpitations on Saturday and stopped the cipro, however a rash developed on Sunday on mainly her arms and upper legs.  It itches, and she has been taking Benadryl at night which helped.  She also applied benadryl cream which helped minimally.  She also has cracking and peeling of her thumbs  bilaterally.  She has been applying vaseline frequently.  Otherwise, she denies fevers, chills, nausea, vomiting, constipation, diarrhea, numbness/tingling, or any further concerns.    REVIEW OF SYSTEMS: A 10 point review of systems was conducted and is otherwise negative except for what is noted above.      PAST MEDICAL HISTORY: Past Medical History  Diagnosis Date  . Thyroid disease   . Osteoporosis   . Psoriasis   . Breast cancer   . Arthritis   . Back pain   . Hypothyroidism   . Anxiety   . Breast cancer     left    PAST SURGICAL HISTORY: Past Surgical History  Procedure Laterality Date  . Cholecystectomy    . Tonsillectomy    . Tubal ligation    . Myomectomy    . Lipoma excision    . Portacath placement N/A 03/01/2014    Procedure: INSERTION PORT-A-CATH;  Surgeon: [name], MD;  Location: [place];  Service: General;  Laterality: N/A;    FAMILY HISTORY Family History  Problem Relation Age of Onset  . Endometrial cancer Mother   . Lung cancer Father   . Brain cancer Father    the patient's father died at the age of 37 from what may have been metastatic lung cancer. The patient knows very little about the father's side of her family. Her mother is alive at age 16. She was recently diagnosed with endometrial cancer. The patient has one brother, 2 sisters. There is no history of breast or ovarian cancer in the family.  GYNECOLOGIC HISTORY:  Menarche age 28, first live birth  age 66. The patient is GX P3. One child died shortly after birth. She stopped having periods approximately 2005. She did not use hormone replacement. 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 Her husband Alexandria Bradley is a Insurance underwriter. Son Alexandria Bradley is that she Nurse, adult in [place]. Daughter Alexandria Bradley lives in [place] and works for [employer] as an  Therapist, sports.    ADVANCED DIRECTIVES: Not in place   HEALTH MAINTENANCE: History  Substance Use Topics  . Smoking status: Former Research scientist (life sciences)  . Smokeless tobacco: Not on file  . Alcohol Use: Yes     Comment: social     Colonoscopy: 2010  PAP: 2013  Bone density: 05/15/2009, at [place], normal, lowest T score -0.8  Lipid panel:  Allergies  Allergen Reactions  . Ciprofloxacin Anxiety and Rash  . Prednisone Shortness Of Breath    "jacks me up" jittery  . Codeine Nausea Only    Nausea   . Cortisone Swelling    "jack me up"    Current Outpatient Prescriptions  Medication Sig Dispense Refill  . Alum & Mag Hydroxide-Simeth (MAGIC MOUTHWASH W/LIDOCAINE) SOLN 5-10 ml QID prn  240 mL  3  . amLODipine (NORVASC) 5 MG tablet       . clonazePAM (KLONOPIN) 0.5 MG tablet Take 0.5 mg by mouth 2 (two) times daily as needed for anxiety.      Marland Kitchen dexamethasone (DECADRON) 4 MG tablet Take 2 tablets by mouth once a day on the day after chemotherapy and then take 2 tablets two times a day for 2 days. Take with food.  30 tablet  1  . lidocaine-prilocaine (EMLA) cream Apply 1 application topically as needed. Apply numbing cream over port site 1-2 hours before chemo and cover with plastic wrap  30 g  0  . liothyronine (CYTOMEL) 5 MCG tablet       . loratadine (CLARITIN) 10 MG tablet Take 10 mg by mouth daily.      Marland Kitchen LORazepam (ATIVAN) 0.5 MG tablet Take 1 tablet (0.5 mg total) by mouth at bedtime as needed (Nausea or vomiting).  30 tablet  0  . ondansetron (ZOFRAN) 8 MG tablet Take 1 tablet (8 mg total) by mouth 2 (two) times daily as needed. Start on the third day after chemotherapy.  30 tablet  1  . prochlorperazine (COMPAZINE) 10 MG tablet Take 1 tablet (10 mg total) by mouth every 6 (six) hours as needed (Nausea or vomiting).  30 tablet  1  . SYNTHROID 137 MCG tablet       . triamterene-hydrochlorothiazide (MAXZIDE-25) 37.5-25 MG per tablet       . acetaminophen (TYLENOL) 500 MG tablet Take 500 mg by  mouth every 6 (six) hours as needed for headache.      . ciprofloxacin (CIPRO) 500 MG tablet Take 1 tablet (500 mg total) by mouth 2 (two) times daily.  14 tablet  0  . HYDROcodone-acetaminophen (HYCET) 7.5-325 mg/15 ml solution Take 10 mLs by mouth 4 (four) times daily as needed for moderate pain.  120 mL  0  . ondansetron (ZOFRAN ODT) 4 MG disintegrating tablet Take 1 tablet (4 mg total) by mouth every 8 (eight) hours as needed for nausea or vomiting.  20 tablet  0   No current facility-administered medications for this visit.    OBJECTIVE: Middle-aged white woman in no acute distress  Filed Vitals:   05/06/14 0943  BP: 138/75  Pulse: 86  Temp: 98.2 F (36.8 C)  Resp: 18     Body mass index is 32.17 kg/(m^2).    ECOG FS:1 - Symptomatic but completely ambulatory  GENERAL: Patient is a well appearing female in no acute distress HEENT:  Sclerae anicteric.  Oropharynx clear and moist. No ulcerations or evidence of oropharyngeal candidiasis. Neck is supple.  NODES:  No cervical, supraclavicular, or axillary lymphadenopathy palpated.  BREAST EXAM:  Deferred. LUNGS:  Clear to auscultation bilaterally.  No wheezes or rhonchi. HEART:  Regular rate and rhythm. No murmur appreciated. ABDOMEN:  Soft, nontender.  Positive, normoactive bowel sounds. No organomegaly palpated. MSK:  No focal spinal tenderness to palpation. Full range of motion bilaterally in the upper extremities. EXTREMITIES:  No peripheral edema.   SKIN:  Maculopapular rash on arms, anterior thighs, upper back and chest. No nail dyscrasia. NEURO:  Nonfocal. Well oriented.  Appropriate affect.  LAB RESULTS:  CMP     Component Value Date/Time   NA 143 05/06/2014 0924   NA 140 03/13/2009 2325   K 3.4* 05/06/2014 0924   K 3.7 03/13/2009 2325   CL 105 03/13/2009 2325   CO2 23 05/06/2014 0924   CO2 29 03/13/2009 2325   GLUCOSE 166* 05/06/2014 0924   GLUCOSE 107* 03/13/2009 2325   GLUCOSE 88 09/22/2006 1056   BUN 11.7 05/06/2014 0924    BUN 12 03/13/2009 2325   CREATININE 0.8 05/06/2014 0924   CREATININE 0.76 03/13/2009 2325   CALCIUM 10.1 05/06/2014 0924   CALCIUM 9.4 03/13/2009 2325   PROT 7.2 05/06/2014 0924   PROT 7.1 03/13/2009 2325   ALBUMIN 4.1 05/06/2014 0924   ALBUMIN 4.1 03/13/2009 2325   AST 25 05/06/2014 0924   AST 136* 03/13/2009 2325   ALT 70* 05/06/2014 0924   ALT 303* 03/13/2009 2325   ALKPHOS 80 05/06/2014 0924   ALKPHOS 131* 03/13/2009 2325   BILITOT 0.37 05/06/2014 0924   BILITOT 0.5 03/13/2009 2325   GFRNONAA >60 03/13/2009 2325   GFRAA  Value: >60        The eGFR has been calculated using the MDRD equation. This calculation has not been validated in all clinical situations. eGFR's persistently <60 mL/min signify possible Chronic Kidney Disease. 03/13/2009 2325    I No results found for this basename: SPEP,  UPEP,   kappa and lambda light chains    Lab Results  Component Value Date   WBC 3.9 05/06/2014   NEUTROABS 2.7 05/06/2014   HGB 11.1* 05/06/2014   HCT 33.2* 05/06/2014   MCV 94.2 05/06/2014   PLT 255 05/06/2014      Chemistry      Component Value Date/Time   NA 143 05/06/2014 0924   NA 140 03/13/2009 2325   K 3.4* 05/06/2014 0924   K 3.7 03/13/2009 2325   CL 105 03/13/2009 2325   CO2 23 05/06/2014 0924   CO2 29 03/13/2009 2325   BUN 11.7 05/06/2014 0924   BUN 12 03/13/2009 2325   CREATININE 0.8 05/06/2014 0924   CREATININE 0.76 03/13/2009 2325      Component Value Date/Time   CALCIUM 10.1 05/06/2014 0924   CALCIUM 9.4 03/13/2009 2325   ALKPHOS 80 05/06/2014 0924   ALKPHOS 131* 03/13/2009 2325   AST 25 05/06/2014 0924   AST 136* 03/13/2009 2325   ALT 70* 05/06/2014 0924   ALT 303* 03/13/2009 2325   BILITOT 0.37 05/06/2014 0924   BILITOT  0.5 03/13/2009 2325       No results found for this basename: LABCA2    No components found with this basename: [id number]    No results found for this basename: INR,  in the last 168 hours  Urinalysis    Component Value Date/Time   COLORURINE YELLOW 03/13/2009 2251   APPEARANCEUR CLOUDY*  03/13/2009 2251   LABSPEC 1.018 03/13/2009 2251   PHURINE 5.5 03/13/2009 2251   GLUCOSEU NEGATIVE 03/13/2009 2251   HGBUR NEGATIVE 03/13/2009 2251   BILIRUBINUR NEGATIVE 03/13/2009 2251   KETONESUR NEGATIVE 03/13/2009 2251   PROTEINUR NEGATIVE 03/13/2009 2251   UROBILINOGEN 0.2 03/13/2009 2251   NITRITE NEGATIVE 03/13/2009 2251   LEUKOCYTESUR SMALL* 03/13/2009 2251    STUDIES: No results found.  ASSESSMENT: 53 y.o. [place] woman status post left breast biopsy 02/08/2014 for a clinical T1c N0, stage IA invasive ductal carcinoma, grade 3, triple negative, with an MIB-1 of 50%.  (1) status post left lumpectomy and sentinel lymph node sampling 03/01/2014 for a pT1c pN0, stage IA invasive ductal carcinoma, grade 3, with negative margins, repeat prognostic panel again triple negative.  (2) adjuvant chemotherapy started a 03/25/2014 consisting of cyclophosphamide and doxorubicin given in dose dense fashion x4, with Neulasta support on day 2; to be followed by weekly Taxol either weekly or every 2 weeks, as the patient may choose at that time  (3) adjuvant radiation to follow chemotherapy  PLAN: Alexandria Bradley is doing well today.  I reviewed her lab work with her which is stable.  She will proceed with her fourth cycle of Doxorubicin and Cyclophosphamide today.    In regards to her rash and palpitations, they are likely secondary to Cipro.  We have added it to her allergy list.  I recommended that she continue benadryl at night.  She will receive Dexamethasone today and this will likely help.  I recommended she get some over the counter hydrocortisone cream to apply to it.  She will call me on Monday if she feels like she would benefit from a steroid taper.    Alexandria Bradley will return tomorrow for Neulasta, and on Monday, 05/16/14 (10 days) for labs and evaluation by Dr. Jana Hakim.   She knows to call us in the interim for any questions or concerns.  We can certainly see her sooner if needed.  I spent 25 minutes  counseling the patient face to face.  The total time spent in the appointment was 30 minutes.  Minette Headland, [place] [phone] 05/06/2014 9:59 AM

## 2014-05-06 NOTE — Patient Instructions (Signed)
[facility] Discharge Instructions for Patients Receiving Chemotherapy  Today you received the following chemotherapy agents: Adriamycin, Cytoxan  To help prevent nausea and vomiting after your treatment, we encourage you to take your nausea medication as prescribed by your physician.   If you develop nausea and vomiting that is not controlled by your nausea medication, call the clinic.   BELOW ARE SYMPTOMS THAT SHOULD BE REPORTED IMMEDIATELY:  *FEVER GREATER THAN 100.5 F  *CHILLS WITH OR WITHOUT FEVER  NAUSEA AND VOMITING THAT IS NOT CONTROLLED WITH YOUR NAUSEA MEDICATION  *UNUSUAL SHORTNESS OF BREATH  *UNUSUAL BRUISING OR BLEEDING  TENDERNESS IN MOUTH AND THROAT WITH OR WITHOUT PRESENCE OF ULCERS  *URINARY PROBLEMS  *BOWEL PROBLEMS  UNUSUAL RASH Items with * indicate a potential emergency and should be followed up as soon as possible.  Feel free to call the clinic you have any questions or concerns. The clinic phone number is [phone].    

## 2014-05-07 ENCOUNTER — Ambulatory Visit (HOSPITAL_BASED_OUTPATIENT_CLINIC_OR_DEPARTMENT_OTHER): Payer: 59

## 2014-05-07 VITALS — BP 134/72 | HR 83 | Temp 98.2°F | Resp 20

## 2014-05-07 DIAGNOSIS — D702 Other drug-induced agranulocytosis: Secondary | ICD-10-CM

## 2014-05-07 DIAGNOSIS — C50219 Malignant neoplasm of upper-inner quadrant of unspecified female breast: Secondary | ICD-10-CM

## 2014-05-07 DIAGNOSIS — Z5189 Encounter for other specified aftercare: Secondary | ICD-10-CM

## 2014-05-07 MED ORDER — PEGFILGRASTIM INJECTION 6 MG/0.6ML
6.0000 mg | Freq: Once | SUBCUTANEOUS | Status: AC
  Administered 2014-05-07: 6 mg via SUBCUTANEOUS

## 2014-05-16 ENCOUNTER — Telehealth: Payer: Self-pay | Admitting: *Deleted

## 2014-05-16 ENCOUNTER — Telehealth: Payer: Self-pay | Admitting: Oncology

## 2014-05-16 ENCOUNTER — Ambulatory Visit (HOSPITAL_BASED_OUTPATIENT_CLINIC_OR_DEPARTMENT_OTHER): Payer: 59 | Admitting: Oncology

## 2014-05-16 ENCOUNTER — Other Ambulatory Visit (HOSPITAL_BASED_OUTPATIENT_CLINIC_OR_DEPARTMENT_OTHER): Payer: 59

## 2014-05-16 VITALS — BP 128/75 | HR 80 | Temp 98.0°F | Resp 18 | Ht 65.0 in | Wt 192.7 lb

## 2014-05-16 DIAGNOSIS — C50212 Malignant neoplasm of upper-inner quadrant of left female breast: Secondary | ICD-10-CM

## 2014-05-16 DIAGNOSIS — R21 Rash and other nonspecific skin eruption: Secondary | ICD-10-CM

## 2014-05-16 DIAGNOSIS — C50219 Malignant neoplasm of upper-inner quadrant of unspecified female breast: Secondary | ICD-10-CM

## 2014-05-16 DIAGNOSIS — K3189 Other diseases of stomach and duodenum: Secondary | ICD-10-CM

## 2014-05-16 DIAGNOSIS — Z171 Estrogen receptor negative status [ER-]: Secondary | ICD-10-CM

## 2014-05-16 DIAGNOSIS — R1013 Epigastric pain: Secondary | ICD-10-CM

## 2014-05-16 DIAGNOSIS — I959 Hypotension, unspecified: Secondary | ICD-10-CM

## 2014-05-16 LAB — CBC WITH DIFFERENTIAL/PLATELET
BASO%: 3.1 % — ABNORMAL HIGH (ref 0.0–2.0)
BASOS ABS: 0.1 10*3/uL (ref 0.0–0.1)
EOS%: 0.3 % (ref 0.0–7.0)
Eosinophils Absolute: 0 10*3/uL (ref 0.0–0.5)
HEMATOCRIT: 30 % — AB (ref 34.8–46.6)
HGB: 10 g/dL — ABNORMAL LOW (ref 11.6–15.9)
LYMPH#: 0.6 10*3/uL — AB (ref 0.9–3.3)
LYMPH%: 16.7 % (ref 14.0–49.7)
MCH: 31.6 pg (ref 25.1–34.0)
MCHC: 33.3 g/dL (ref 31.5–36.0)
MCV: 94.9 fL (ref 79.5–101.0)
MONO#: 0.6 10*3/uL (ref 0.1–0.9)
MONO%: 17.8 % — ABNORMAL HIGH (ref 0.0–14.0)
NEUT#: 2.2 10*3/uL (ref 1.5–6.5)
NEUT%: 62.1 % (ref 38.4–76.8)
Platelets: 254 10*3/uL (ref 145–400)
RBC: 3.16 10*6/uL — ABNORMAL LOW (ref 3.70–5.45)
RDW: 15.1 % — AB (ref 11.2–14.5)
WBC: 3.5 10*3/uL — ABNORMAL LOW (ref 3.9–10.3)
nRBC: 3 % — ABNORMAL HIGH (ref 0–0)

## 2014-05-16 LAB — COMPREHENSIVE METABOLIC PANEL (CC13)
ALBUMIN: 3.9 g/dL (ref 3.5–5.0)
ALK PHOS: 73 U/L (ref 40–150)
ALT: 42 U/L (ref 0–55)
AST: 21 U/L (ref 5–34)
Anion Gap: 12 mEq/L — ABNORMAL HIGH (ref 3–11)
BUN: 15.9 mg/dL (ref 7.0–26.0)
CALCIUM: 10.3 mg/dL (ref 8.4–10.4)
CHLORIDE: 102 meq/L (ref 98–109)
CO2: 26 mEq/L (ref 22–29)
Creatinine: 0.7 mg/dL (ref 0.6–1.1)
Glucose: 110 mg/dl (ref 70–140)
POTASSIUM: 3.6 meq/L (ref 3.5–5.1)
SODIUM: 140 meq/L (ref 136–145)
TOTAL PROTEIN: 7.2 g/dL (ref 6.4–8.3)
Total Bilirubin: <0.2 mg/dL (ref 0.20–1.20)

## 2014-05-16 MED ORDER — METHYLPREDNISOLONE 4 MG PO KIT
PACK | ORAL | Status: DC
Start: 2014-05-16 — End: 2014-06-10

## 2014-05-16 NOTE — Progress Notes (Signed)
Alexandria Bradley  [phone] [phone]) [phone]     ID: Alexandria Bradley OB: 1960/10/01  MR#: [id number]  ZWC#:[id number]  PCP: Lovenia Kim, MD GYN:   SU: [name] OTHER MD: [name], Delrae Rend, Wilford Corner  CHIEF COMPLAINT: Triple negative breast cancer  CURRENT TREATMENT: Adjuvant chemotherapy  BREAST CANCER HISTORY: From the original intake note of 02/16/2014:  [name] palpated a mass in her left breast early May and brought it immediately to her gynecologist attention. He set her up for bilateral diagnostic mammography and left ultrasonography at [facility] 02/07/2014. Mammography showed a 1.3 cm oval mass with spiculated margins in the left breast at the 11:00 position. This was palpable. Ultrasound showed a 1.1 cm tall her than wide mass with a microlobulated margins. He was hypoechoic. There were no abnormalities noted in the left axilla.  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 Lyndon Code at conference 02/16/2014), triple negative, with an MIB-1 of 50%.  The patient's subsequent history is as detailed below  INTERVAL HISTORY: [name]  returns today for followup of her breast cancer accompanied by her husband [name]. Today is day 11 cycle 4 of her Doxorubicin and Cyclophosphamide adjuvant chemotherapy. She is now ready to start her treatments with Taxol.  REVIEW OF SYSTEMS: This last cycle of chemotherapy was very hard on her. Recovery was slow. She had nausea, although no vomiting. The rash in her arms a, front of her legs, and upper back is worse. She has been able to work, but it's hard to focus at work and she's been very weak. Her husband wonders if she has sleep apnea. She had an episode of mild epistaxis yesterday. Her gums bled a little bit as well. She had mild temperatures, a little bit of a dry cough, and  occasional headaches. Otherwise a detailed review of systems today was stable    PAST MEDICAL HISTORY: Past Medical History  Diagnosis Date  . Thyroid disease   . Osteoporosis   . Psoriasis   . Breast cancer   . Arthritis   . Back pain   . Hypothyroidism   . Anxiety   . Breast cancer     left    PAST SURGICAL HISTORY: Past Surgical History  Procedure Laterality Date  . Cholecystectomy    . Tonsillectomy    . Tubal ligation    . Myomectomy    . Lipoma excision    . Portacath placement N/A 03/01/2014    Procedure: INSERTION PORT-A-CATH;  Surgeon: [name], MD;  Location: [facility];  Service: General;  Laterality: N/A;    FAMILY HISTORY Family History  Problem Relation Age of Onset  . Endometrial cancer Mother   . Lung cancer Father   . Brain cancer Father    the patient's father died at the age of 61 from what may have been metastatic lung cancer. The patient knows very little about the father's side of her family. Her mother is alive at age 59. She was recently diagnosed with endometrial cancer. The patient has one brother, 2 sisters. There is no history of breast or ovarian cancer in the family.  GYNECOLOGIC HISTORY:  Menarche age 41, first live birth age 9. The patient is GX P3. One child died shortly after birth. She stopped having periods approximately 2005. She did not use hormone replacement. 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 Her husband [name] is a Insurance underwriter. Son [name] is that she Nurse, adult in [place]. Daughter [name] lives in [place] and works for [employer] as an Therapist, sports.    ADVANCED DIRECTIVES: Not in place   HEALTH MAINTENANCE: History  Substance Use Topics  . Smoking status: Former Research scientist (life sciences)  . Smokeless tobacco: Not on file  . Alcohol Use: Yes     Comment: social     Colonoscopy:  2010  PAP: 2013  Bone density: 05/15/2009, at [facility], normal, lowest T score -0.8  Lipid panel:  Allergies  Allergen Reactions  . Ciprofloxacin Anxiety and Rash  . Prednisone Shortness Of Breath    "jacks me up" jittery  . Codeine Nausea Only    Nausea   . Cortisone Swelling    "jack me up"    Current Outpatient Prescriptions  Medication Sig Dispense Refill  . acetaminophen (TYLENOL) 500 MG tablet Take 500 mg by mouth every 6 (six) hours as needed for headache.      . Alum & Mag Hydroxide-Simeth (MAGIC MOUTHWASH W/LIDOCAINE) SOLN 5-10 ml QID prn  240 mL  3  . amLODipine (NORVASC) 5 MG tablet       . ciprofloxacin (CIPRO) 500 MG tablet Take 1 tablet (500 mg total) by mouth 2 (two) times daily.  14 tablet  0  . clonazePAM (KLONOPIN) 0.5 MG tablet Take 0.5 mg by mouth 2 (two) times daily as needed for anxiety.      Marland Kitchen dexamethasone (DECADRON) 4 MG tablet Take 2 tablets by mouth once a day on the day after chemotherapy and then take 2 tablets two times a day for 2 days. Take with food.  30 tablet  1  . HYDROcodone-acetaminophen (HYCET) 7.5-325 mg/15 ml solution Take 10 mLs by mouth 4 (four) times daily as needed for moderate pain.  120 mL  0  . lidocaine-prilocaine (EMLA) cream Apply 1 application topically as needed. Apply numbing cream over port site 1-2 hours before chemo and cover with plastic wrap  30 g  0  . liothyronine (CYTOMEL) 5 MCG tablet       . loratadine (CLARITIN) 10 MG tablet Take 10 mg by mouth daily.      Marland Kitchen LORazepam (ATIVAN) 0.5 MG tablet Take 1 tablet (0.5 mg total) by mouth at bedtime as needed (Nausea or vomiting).  30 tablet  0  . ondansetron (ZOFRAN ODT) 4 MG disintegrating tablet Take 1 tablet (4 mg total) by mouth every 8 (eight) hours as needed for nausea or vomiting.  20 tablet  0  . ondansetron (ZOFRAN) 8 MG tablet Take 1 tablet (8 mg total) by mouth 2 (two) times daily as needed. Start on the third day after chemotherapy.  30 tablet  1  .  prochlorperazine (COMPAZINE) 10 MG tablet Take 1 tablet (10 mg total) by mouth every 6 (six) hours as needed (Nausea or vomiting).  30 tablet  1  . SYNTHROID 137 MCG tablet       . triamterene-hydrochlorothiazide (MAXZIDE-25) 37.5-25 MG per tablet        No current facility-administered medications for this visit.    OBJECTIVE: Middle-aged white woman who appears stated age  53 Vitals:   05/16/14 0823  BP: 128/75  Pulse: 80  Temp: 98 F (36.7 C)  Resp: 18     Body mass index is 32.07 kg/(m^2).  ECOG FS:1 - Symptomatic but completely ambulatory  Sclerae unicteric, pupils equal and reactive Oropharynx clear and moist-- no thrush No cervical or supraclavicular adenopathy Lungs no rales or rhonchi Heart regular rate and rhythm Abd soft, nontender, positive bowel sounds MSK no focal spinal tenderness, no upper extremity lymphedema Neuro: nonfocal, well oriented, appropriate affect Breasts: Deferred Skin: She has a scattered erythematous operable rash chiefly over the arms, a little bit in the upper back. This is itchy and even painful at times.  LAB RESULTS:  CMP     Component Value Date/Time   NA 143 05/06/2014 0924   NA 140 03/13/2009 2325   K 3.4* 05/06/2014 0924   K 3.7 03/13/2009 2325   CL 105 03/13/2009 2325   CO2 23 05/06/2014 0924   CO2 29 03/13/2009 2325   GLUCOSE 166* 05/06/2014 0924   GLUCOSE 107* 03/13/2009 2325   GLUCOSE 88 09/22/2006 1056   BUN 11.7 05/06/2014 0924   BUN 12 03/13/2009 2325   CREATININE 0.8 05/06/2014 0924   CREATININE 0.76 03/13/2009 2325   CALCIUM 10.1 05/06/2014 0924   CALCIUM 9.4 03/13/2009 2325   PROT 7.2 05/06/2014 0924   PROT 7.1 03/13/2009 2325   ALBUMIN 4.1 05/06/2014 0924   ALBUMIN 4.1 03/13/2009 2325   AST 25 05/06/2014 0924   AST 136* 03/13/2009 2325   ALT 70* 05/06/2014 0924   ALT 303* 03/13/2009 2325   ALKPHOS 80 05/06/2014 0924   ALKPHOS 131* 03/13/2009 2325   BILITOT 0.37 05/06/2014 0924   BILITOT 0.5 03/13/2009 2325   GFRNONAA >60 03/13/2009 2325    GFRAA  Value: >60        The eGFR has been calculated using the MDRD equation. This calculation has not been validated in all clinical situations. eGFR's persistently <60 mL/min signify possible Chronic Kidney Disease. 03/13/2009 2325    I No results found for this basename: SPEP,  UPEP,   kappa and lambda light chains    Lab Results  Component Value Date   WBC 3.5* 05/16/2014   NEUTROABS 2.2 05/16/2014   HGB 10.0* 05/16/2014   HCT 30.0* 05/16/2014   MCV 94.9 05/16/2014   PLT 254 05/16/2014      Chemistry      Component Value Date/Time   NA 143 05/06/2014 0924   NA 140 03/13/2009 2325   K 3.4* 05/06/2014 0924   K 3.7 03/13/2009 2325   CL 105 03/13/2009 2325   CO2 23 05/06/2014 0924   CO2 29 03/13/2009 2325   BUN 11.7 05/06/2014 0924   BUN 12 03/13/2009 2325   CREATININE 0.8 05/06/2014 0924   CREATININE 0.76 03/13/2009 2325      Component Value Date/Time   CALCIUM 10.1 05/06/2014 0924   CALCIUM 9.4 03/13/2009 2325   ALKPHOS 80 05/06/2014 0924   ALKPHOS 131* 03/13/2009 2325   AST 25 05/06/2014 0924   AST 136* 03/13/2009 2325   ALT 70* 05/06/2014 0924   ALT 303* 03/13/2009 2325   BILITOT 0.37 05/06/2014 0924   BILITOT 0.5 03/13/2009 2325       No results found for this basename: LABCA2    No components found with this basename: [id number]    No results found for this basename: INR,  in the last 168 hours  Urinalysis    Component Value Date/Time   COLORURINE YELLOW 03/13/2009 2251   APPEARANCEUR CLOUDY* 03/13/2009 2251   LABSPEC 1.018 03/13/2009 2251   PHURINE 5.5 03/13/2009 [place] 03/13/2009 2251  HGBUR NEGATIVE 03/13/2009 2251   BILIRUBINUR NEGATIVE 03/13/2009 2251   KETONESUR NEGATIVE 03/13/2009 2251   PROTEINUR NEGATIVE 03/13/2009 2251   UROBILINOGEN 0.2 03/13/2009 2251   NITRITE NEGATIVE 03/13/2009 2251   LEUKOCYTESUR SMALL* 03/13/2009 2251    STUDIES: No results found.  ASSESSMENT: 53 y.o.  woman status post left breast biopsy 02/08/2014 for a clinical T1c N0,  stage IA invasive ductal carcinoma, grade 3, triple negative, with an MIB-1 of 50%.  (1) status post left lumpectomy and sentinel lymph node sampling 03/01/2014 for a pT1c pN0, stage IA invasive ductal carcinoma, grade 3, with negative margins, repeat prognostic panel again triple negative.  (2) adjuvant chemotherapy started a 03/25/2014 consisting of cyclophosphamide and doxorubicin given in dose dense fashion x4, with Neulasta support on day 2, completed 05/06/2014; to be followed by weekly Taxol starting 05/27/2014  (3) adjuvant radiation to follow chemotherapy  (4) skin rash, possibly some associated  (5) question of sleep apnea  PLAN: Trilby completed the worse part of her chemotherapy. Given the difficulties she just experienced, I think it would be much better for her to go for the weekly Taxol rather than the every 2 weeks. Today we discussed the possible toxicities, side effects and complications of this agent, particularly concerns regarding neuropathy, and also the "first dose "side effects that may occur. We are not going to start until August when he is to give her a little bit more time to recover from her recent chemotherapy.  I have put her on a Medrol Dosepak for the rash. Hopefully that will be pretty much gone by the time she starts Taxol. I am going to see her with a second dose of Taxol to simplify her antinausea medication, mostly discontinuing the Decadron at that time.  Vienne is having some stomach pain. This is not relieved by Nexium. She also has had some low blood pressure. Today we stopped the hydrochlorothiazide. She will keep an eye on that. As far as a question regarding sleep apnea, we are postponing that until she is in a more stable situation, namely after completing in recovering from radiation.  She has a good understanding of the overall plan. She agrees with that. She knows the goal of treatment her cases cure. She'll call with any problems that may develop  before next visit here. 05/16/2014 8:33 AM

## 2014-05-16 NOTE — Telephone Encounter (Signed)
per pof to sch pt appt-MW sch trmts-gave pt copy of sch

## 2014-05-16 NOTE — Telephone Encounter (Signed)
Per staff message and POF I have scheduled appts. Advised scheduler of appts. JMW  

## 2014-05-16 NOTE — Addendum Note (Signed)
Addended by: Amelia Jo I on: 05/16/2014 10:20 AM   Modules accepted: Orders, Medications

## 2014-05-27 ENCOUNTER — Ambulatory Visit (HOSPITAL_BASED_OUTPATIENT_CLINIC_OR_DEPARTMENT_OTHER): Payer: 59

## 2014-05-27 ENCOUNTER — Other Ambulatory Visit (HOSPITAL_BASED_OUTPATIENT_CLINIC_OR_DEPARTMENT_OTHER): Payer: 59

## 2014-05-27 VITALS — BP 111/65 | HR 68 | Temp 98.7°F | Resp 14

## 2014-05-27 DIAGNOSIS — C50219 Malignant neoplasm of upper-inner quadrant of unspecified female breast: Secondary | ICD-10-CM

## 2014-05-27 DIAGNOSIS — C50212 Malignant neoplasm of upper-inner quadrant of left female breast: Secondary | ICD-10-CM

## 2014-05-27 DIAGNOSIS — Z5111 Encounter for antineoplastic chemotherapy: Secondary | ICD-10-CM

## 2014-05-27 LAB — CBC WITH DIFFERENTIAL/PLATELET
BASO%: 1 % (ref 0.0–2.0)
BASOS ABS: 0.1 10*3/uL (ref 0.0–0.1)
EOS ABS: 0.1 10*3/uL (ref 0.0–0.5)
EOS%: 1 % (ref 0.0–7.0)
HEMATOCRIT: 33.8 % — AB (ref 34.8–46.6)
HEMOGLOBIN: 10.9 g/dL — AB (ref 11.6–15.9)
LYMPH%: 13.4 % — ABNORMAL LOW (ref 14.0–49.7)
MCH: 31.1 pg (ref 25.1–34.0)
MCHC: 32.2 g/dL (ref 31.5–36.0)
MCV: 96.6 fL (ref 79.5–101.0)
MONO#: 1.1 10*3/uL — AB (ref 0.1–0.9)
MONO%: 23.3 % — ABNORMAL HIGH (ref 0.0–14.0)
NEUT%: 61.3 % (ref 38.4–76.8)
NEUTROS ABS: 3 10*3/uL (ref 1.5–6.5)
Platelets: 316 10*3/uL (ref 145–400)
RBC: 3.5 10*6/uL — ABNORMAL LOW (ref 3.70–5.45)
RDW: 16.7 % — ABNORMAL HIGH (ref 11.2–14.5)
WBC: 4.8 10*3/uL (ref 3.9–10.3)
lymph#: 0.7 10*3/uL — ABNORMAL LOW (ref 0.9–3.3)

## 2014-05-27 LAB — COMPREHENSIVE METABOLIC PANEL (CC13)
ALBUMIN: 3.9 g/dL (ref 3.5–5.0)
ALT: 51 U/L (ref 0–55)
AST: 28 U/L (ref 5–34)
Alkaline Phosphatase: 65 U/L (ref 40–150)
Anion Gap: 11 mEq/L (ref 3–11)
BUN: 7.2 mg/dL (ref 7.0–26.0)
CALCIUM: 9.7 mg/dL (ref 8.4–10.4)
CHLORIDE: 105 meq/L (ref 98–109)
CO2: 24 meq/L (ref 22–29)
CREATININE: 0.7 mg/dL (ref 0.6–1.1)
Glucose: 139 mg/dl (ref 70–140)
Potassium: 3.4 mEq/L — ABNORMAL LOW (ref 3.5–5.1)
Sodium: 140 mEq/L (ref 136–145)
Total Bilirubin: 0.4 mg/dL (ref 0.20–1.20)
Total Protein: 6.8 g/dL (ref 6.4–8.3)

## 2014-05-27 MED ORDER — HEPARIN SOD (PORK) LOCK FLUSH 100 UNIT/ML IV SOLN
500.0000 [IU] | Freq: Once | INTRAVENOUS | Status: AC | PRN
Start: 2014-05-27 — End: 2014-05-27
  Administered 2014-05-27: 500 [IU]
  Filled 2014-05-27: qty 5

## 2014-05-27 MED ORDER — DEXAMETHASONE SODIUM PHOSPHATE 20 MG/5ML IJ SOLN
20.0000 mg | Freq: Once | INTRAMUSCULAR | Status: AC
  Administered 2014-05-27: 20 mg via INTRAVENOUS

## 2014-05-27 MED ORDER — DIPHENHYDRAMINE HCL 50 MG/ML IJ SOLN
INTRAMUSCULAR | Status: AC
  Filled 2014-05-27: qty 1

## 2014-05-27 MED ORDER — ONDANSETRON 8 MG/NS 50 ML IVPB
INTRAVENOUS | Status: AC
  Filled 2014-05-27: qty 8

## 2014-05-27 MED ORDER — DIPHENHYDRAMINE HCL 50 MG/ML IJ SOLN
25.0000 mg | Freq: Once | INTRAMUSCULAR | Status: AC
  Administered 2014-05-27: 25 mg via INTRAVENOUS

## 2014-05-27 MED ORDER — PACLITAXEL CHEMO INJECTION 300 MG/50ML
80.0000 mg/m2 | Freq: Once | INTRAVENOUS | Status: AC
  Administered 2014-05-27: 162 mg via INTRAVENOUS
  Filled 2014-05-27: qty 27

## 2014-05-27 MED ORDER — SODIUM CHLORIDE 0.9 % IJ SOLN
10.0000 mL | INTRAMUSCULAR | Status: DC | PRN
  Administered 2014-05-27: 10 mL
  Filled 2014-05-27: qty 10

## 2014-05-27 MED ORDER — FAMOTIDINE IN NACL 20-0.9 MG/50ML-% IV SOLN
20.0000 mg | Freq: Once | INTRAVENOUS | Status: AC
  Administered 2014-05-27: 20 mg via INTRAVENOUS

## 2014-05-27 MED ORDER — ONDANSETRON 8 MG/50ML IVPB (CHCC)
8.0000 mg | Freq: Once | INTRAVENOUS | Status: AC
  Administered 2014-05-27: 8 mg via INTRAVENOUS

## 2014-05-27 MED ORDER — SODIUM CHLORIDE 0.9 % IV SOLN
Freq: Once | INTRAVENOUS | Status: AC
  Administered 2014-05-27: 11:00:00 via INTRAVENOUS

## 2014-05-27 MED ORDER — DEXAMETHASONE SODIUM PHOSPHATE 20 MG/5ML IJ SOLN
INTRAMUSCULAR | Status: AC
  Filled 2014-05-27: qty 5

## 2014-05-27 MED ORDER — FAMOTIDINE IN NACL 20-0.9 MG/50ML-% IV SOLN
INTRAVENOUS | Status: AC
  Filled 2014-05-27: qty 50

## 2014-05-27 NOTE — Patient Instructions (Signed)
Paclitaxel injection What is this medicine? PACLITAXEL (PAK li TAX el) is a chemotherapy drug. It targets fast dividing cells, like cancer cells, and causes these cells to die. This medicine is used to treat ovarian cancer, breast cancer, and other cancers. This medicine may be used for other purposes; ask your health care provider or pharmacist if you have questions. COMMON BRAND NAME(S): Onxol, Taxol What should I tell my health care provider before I take this medicine? They need to know if you have any of these conditions: -blood disorders -irregular heartbeat -infection (especially a virus infection such as chickenpox, cold sores, or herpes) -liver disease -previous or ongoing radiation therapy -an unusual or allergic reaction to paclitaxel, alcohol, polyoxyethylated castor oil, other chemotherapy agents, other medicines, foods, dyes, or preservatives -pregnant or trying to get pregnant -breast-feeding How should I use this medicine? This drug is given as an infusion into a vein. It is administered in a hospital or clinic by a specially trained health care professional. Talk to your pediatrician regarding the use of this medicine in children. Special care may be needed. Overdosage: If you think you have taken too much of this medicine contact a poison control center or emergency room at once. NOTE: This medicine is only for you. Do not share this medicine with others. What if I miss a dose? It is important not to miss your dose. Call your doctor or health care professional if you are unable to keep an appointment. What may interact with this medicine? Do not take this medicine with any of the following medications: -disulfiram -metronidazole This medicine may also interact with the following medications: -cyclosporine -diazepam -ketoconazole -medicines to increase blood counts like filgrastim, pegfilgrastim, sargramostim -other chemotherapy drugs like cisplatin, doxorubicin,  epirubicin, etoposide, teniposide, vincristine -quinidine -testosterone -vaccines -verapamil Talk to your doctor or health care professional before taking any of these medicines: -acetaminophen -aspirin -ibuprofen -ketoprofen -naproxen This list may not describe all possible interactions. Give your health care provider a list of all the medicines, herbs, non-prescription drugs, or dietary supplements you use. Also tell them if you smoke, drink alcohol, or use illegal drugs. Some items may interact with your medicine. What should I watch for while using this medicine? Your condition will be monitored carefully while you are receiving this medicine. You will need important blood work done while you are taking this medicine. This drug may make you feel generally unwell. This is not uncommon, as chemotherapy can affect healthy cells as well as cancer cells. Report any side effects. Continue your course of treatment even though you feel ill unless your doctor tells you to stop. In some cases, you may be given additional medicines to help with side effects. Follow all directions for their use. Call your doctor or health care professional for advice if you get a fever, chills or sore throat, or other symptoms of a cold or flu. Do not treat yourself. This drug decreases your body's ability to fight infections. Try to avoid being around people who are sick. This medicine may increase your risk to bruise or bleed. Call your doctor or health care professional if you notice any unusual bleeding. Be careful brushing and flossing your teeth or using a toothpick because you may get an infection or bleed more easily. If you have any dental work done, tell your dentist you are receiving this medicine. Avoid taking products that contain aspirin, acetaminophen, ibuprofen, naproxen, or ketoprofen unless instructed by your doctor. These medicines may hide a fever.   Do not become pregnant while taking this medicine.  Women should inform their doctor if they wish to become pregnant or think they might be pregnant. There is a potential for serious side effects to an unborn child. Talk to your health care professional or pharmacist for more information. Do not breast-feed an infant while taking this medicine. Men are advised not to father a child while receiving this medicine. What side effects may I notice from receiving this medicine? Side effects that you should report to your doctor or health care professional as soon as possible: -allergic reactions like skin rash, itching or hives, swelling of the face, lips, or tongue -low blood counts - This drug may decrease the number of white blood cells, red blood cells and platelets. You may be at increased risk for infections and bleeding. -signs of infection - fever or chills, cough, sore throat, pain or difficulty passing urine -signs of decreased platelets or bleeding - bruising, pinpoint red spots on the skin, black, tarry stools, nosebleeds -signs of decreased red blood cells - unusually weak or tired, fainting spells, lightheadedness -breathing problems -chest pain -high or low blood pressure -mouth sores -nausea and vomiting -pain, swelling, redness or irritation at the injection site -pain, tingling, numbness in the hands or feet -slow or irregular heartbeat -swelling of the ankle, feet, hands Side effects that usually do not require medical attention (report to your doctor or health care professional if they continue or are bothersome): -bone pain -complete hair loss including hair on your head, underarms, pubic hair, eyebrows, and eyelashes -changes in the color of fingernails -diarrhea -loosening of the fingernails -loss of appetite -muscle or joint pain -red flush to skin -sweating This list may not describe all possible side effects. Call your doctor for medical advice about side effects. You may report side effects to FDA at  1-800-FDA-1088. Where should I keep my medicine? This drug is given in a hospital or clinic and will not be stored at home. NOTE: This sheet is a summary. It may not cover all possible information. If you have questions about this medicine, talk to your doctor, pharmacist, or health care provider.  2015, [employer]vier/Gold Standard. (2012-11-09 16:41:21)  

## 2014-05-31 ENCOUNTER — Telehealth: Payer: Self-pay | Admitting: *Deleted

## 2014-05-31 NOTE — Telephone Encounter (Signed)
Called pt on cell phone and left message on voice mail for post chemo follow up.  Left message requesting a call back.

## 2014-06-01 NOTE — Telephone Encounter (Signed)
PT. RETURNED CALL THIS MORNING. SHE IS EATING AND FORCING FLUIDS. NO NAUSEA OR VOMITING. NO MOUTH ISSUES. PT. HAS HAD DIARRHEA FOUR TO SIX TIMES WITHIN THE PAST 24 HOURS. ENCOURAGED PT. TO TAKE IMODIUM AS DIRECTED ON THE BOX AND TO INCREASE HER FLUID INTAKE. PT. HAD SEVERE BODY ACHES Monday AND Tuesday WHICH TYLENOL HELPED SOME. LAST NIGHT AFTER WORK SHE TOOK A DECADRON. THE BODY ACHES ARE BETTER TODAY. VERBAL ORDER AND READ BACK TO DR.MAGRINAT- PT. MAY USE WHATEVER SHE NEEDS FOR THE BODY ACHES BUT THEY SHOULD BE GONE TOMORROW. NOTIFIED PT. SHE VOICES UNDERSTANDING.

## 2014-06-03 ENCOUNTER — Ambulatory Visit (HOSPITAL_BASED_OUTPATIENT_CLINIC_OR_DEPARTMENT_OTHER): Payer: 59 | Admitting: Oncology

## 2014-06-03 ENCOUNTER — Ambulatory Visit: Payer: 59

## 2014-06-03 ENCOUNTER — Other Ambulatory Visit (HOSPITAL_BASED_OUTPATIENT_CLINIC_OR_DEPARTMENT_OTHER): Payer: 59

## 2014-06-03 ENCOUNTER — Telehealth: Payer: Self-pay | Admitting: Oncology

## 2014-06-03 VITALS — BP 138/79 | HR 86 | Temp 98.7°F | Resp 18 | Ht 65.0 in | Wt 190.1 lb

## 2014-06-03 DIAGNOSIS — C50212 Malignant neoplasm of upper-inner quadrant of left female breast: Secondary | ICD-10-CM

## 2014-06-03 DIAGNOSIS — C50219 Malignant neoplasm of upper-inner quadrant of unspecified female breast: Secondary | ICD-10-CM

## 2014-06-03 DIAGNOSIS — Z171 Estrogen receptor negative status [ER-]: Secondary | ICD-10-CM

## 2014-06-03 LAB — CBC WITH DIFFERENTIAL/PLATELET
BASO%: 0.4 % (ref 0.0–2.0)
Basophils Absolute: 0 10*3/uL (ref 0.0–0.1)
EOS%: 1.9 % (ref 0.0–7.0)
Eosinophils Absolute: 0.2 10*3/uL (ref 0.0–0.5)
HCT: 35.2 % (ref 34.8–46.6)
HGB: 11.7 g/dL (ref 11.6–15.9)
LYMPH#: 0.5 10*3/uL — AB (ref 0.9–3.3)
LYMPH%: 4.7 % — AB (ref 14.0–49.7)
MCH: 31.9 pg (ref 25.1–34.0)
MCHC: 33.2 g/dL (ref 31.5–36.0)
MCV: 95.9 fL (ref 79.5–101.0)
MONO#: 0.6 10*3/uL (ref 0.1–0.9)
MONO%: 6.1 % (ref 0.0–14.0)
NEUT#: 8.8 10*3/uL — ABNORMAL HIGH (ref 1.5–6.5)
NEUT%: 86.9 % — ABNORMAL HIGH (ref 38.4–76.8)
Platelets: 309 10*3/uL (ref 145–400)
RBC: 3.67 10*6/uL — AB (ref 3.70–5.45)
RDW: 16.3 % — ABNORMAL HIGH (ref 11.2–14.5)
WBC: 10.2 10*3/uL (ref 3.9–10.3)

## 2014-06-03 LAB — COMPREHENSIVE METABOLIC PANEL (CC13)
ALT: 54 U/L (ref 0–55)
ANION GAP: 10 meq/L (ref 3–11)
AST: 23 U/L (ref 5–34)
Albumin: 3.6 g/dL (ref 3.5–5.0)
Alkaline Phosphatase: 61 U/L (ref 40–150)
BILIRUBIN TOTAL: 0.43 mg/dL (ref 0.20–1.20)
BUN: 17.4 mg/dL (ref 7.0–26.0)
CHLORIDE: 99 meq/L (ref 98–109)
CO2: 27 meq/L (ref 22–29)
Calcium: 9.4 mg/dL (ref 8.4–10.4)
Creatinine: 0.8 mg/dL (ref 0.6–1.1)
Glucose: 101 mg/dl (ref 70–140)
Potassium: 3.1 mEq/L — ABNORMAL LOW (ref 3.5–5.1)
Sodium: 136 mEq/L (ref 136–145)
TOTAL PROTEIN: 6.5 g/dL (ref 6.4–8.3)

## 2014-06-03 NOTE — Telephone Encounter (Signed)
cld pt and left message in re to appt time & date-will mail copy

## 2014-06-03 NOTE — Progress Notes (Signed)
Alexandria Bradley  Telephone:(336) [phone] Fax:(336) [phone]     ID: [name] OB: 12/06/1960  MR#: [id number]  OFB#:[id number]  PCP: Lovenia Kim, MD GYN:   SU: [name] OTHER MD: [name], Delrae Rend, Wilford Corner  CHIEF COMPLAINT: Triple negative breast cancer  CURRENT TREATMENT: Adjuvant chemotherapy  BREAST CANCER HISTORY: From the original intake note of 02/16/2014:  [name] palpated a mass in her left breast early May and brought it immediately to her gynecologist attention. He set her up for bilateral diagnostic mammography and left ultrasonography at [facility] 02/07/2014. Mammography showed a 1.3 cm oval mass with spiculated margins in the left breast at the 11:00 position. This was palpable. Ultrasound showed a 1.1 cm tall her than wide mass with a microlobulated margins. He was hypoechoic. There were no abnormalities noted in the left axilla.  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 Lyndon Code at conference 02/16/2014), triple negative, with an MIB-1 of 50%.  The patient's subsequent history is as detailed below  INTERVAL HISTORY: Alexandria Bradley  returns today for followup of her breast cancer accompanied by her husband [name]. Today is day 8 cycle 1 of 12 planned weekly paclitaxel treatments.  REVIEW OF SYSTEMS: [name] did terrible with this chemotherapy. She did fine for the first 2 or 3 days, then developed severe body aches, which eventually became severe bone pain. 2 days after that she started having stomach cramps and diarrhea, 4-6 times a day. The diarrhea was nonbloody or mucousy. She started Imodium yesterday. She has had no bowel movements since yesterday afternoon. The pain however persisted. In addition she had some tingling in her big toes, which felt a "swollen". She had no fever or bleeding problems. Her rash  has resolved. She has absolutely no energy. She continues to have taste progression. She had nausea this morning in almost vomited. A detailed review of systems today was otherwise stable   PAST MEDICAL HISTORY: Past Medical History  Diagnosis Date  . Thyroid disease   . Osteoporosis   . Psoriasis   . Breast cancer   . Arthritis   . Back pain   . Hypothyroidism   . Anxiety   . Breast cancer     left    PAST SURGICAL HISTORY: Past Surgical History  Procedure Laterality Date  . Cholecystectomy    . Tonsillectomy    . Tubal ligation    . Myomectomy    . Lipoma excision    . Portacath placement N/A 03/01/2014    Procedure: INSERTION PORT-A-CATH;  Surgeon: [name], MD;  Location: [facility];  Service: General;  Laterality: N/A;    FAMILY HISTORY Family History  Problem Relation Age of Onset  . Endometrial cancer Mother   . Lung cancer Father   . Brain cancer Father    the patient's father died at the age of 33 from what may have been metastatic lung cancer. The patient knows very little about the father's side of her family. Her mother is alive at age 95. She was recently diagnosed with endometrial cancer. The patient has one brother, 2 sisters. There is no history of breast or ovarian cancer in the family.  GYNECOLOGIC HISTORY:  Menarche age 79, first live birth age 13. The patient is GX P3. One child died shortly after birth. She stopped having periods approximately 2005. She did not use hormone replacement.  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 Her husband [name] is a Insurance underwriter. Son [name] is that she Nurse, adult in [place]. Daughter [name] lives in [place] and works for [employer] as an Therapist, sports.    ADVANCED DIRECTIVES: Not in place   HEALTH MAINTENANCE: History  Substance Use Topics  . Smoking status: Former  Research scientist (life sciences)  . Smokeless tobacco: Not on file  . Alcohol Use: Yes     Comment: social     Colonoscopy: 2010  PAP: 2013  Bone density: 05/15/2009, at [place], normal, lowest T score -0.8  Lipid panel:  Allergies  Allergen Reactions  . Ciprofloxacin Anxiety and Rash  . Prednisone Shortness Of Breath    "jacks me up" jittery  . Codeine Nausea Only    Nausea   . Cortisone Swelling    "jack me up"    Current Outpatient Prescriptions  Medication Sig Dispense Refill  . acetaminophen (TYLENOL) 500 MG tablet Take 500 mg by mouth every 6 (six) hours as needed for headache.      . Alum & Mag Hydroxide-Simeth (MAGIC MOUTHWASH W/LIDOCAINE) SOLN 5-10 ml QID prn  240 mL  3  . amLODipine (NORVASC) 5 MG tablet       . clonazePAM (KLONOPIN) 0.5 MG tablet Take 0.5 mg by mouth 2 (two) times daily as needed for anxiety.      Marland Kitchen HYDROcodone-acetaminophen (HYCET) 7.5-325 mg/15 ml solution Take 10 mLs by mouth 4 (four) times daily as needed for moderate pain.  120 mL  0  . liothyronine (CYTOMEL) 5 MCG tablet       . loratadine (CLARITIN) 10 MG tablet Take 10 mg by mouth daily.      . methylPREDNISolone (MEDROL DOSEPAK) 4 MG tablet follow package directions  21 tablet  0  . ondansetron (ZOFRAN ODT) 4 MG disintegrating tablet Take 1 tablet (4 mg total) by mouth every 8 (eight) hours as needed for nausea or vomiting.  20 tablet  0  . SYNTHROID 137 MCG tablet       . triamterene-hydrochlorothiazide (MAXZIDE-25) 37.5-25 MG per tablet        No current facility-administered medications for this visit.    OBJECTIVE: Middle-aged white woman who appears fatigued  Filed Vitals:   06/03/14 1003  BP: 138/79  Pulse: 86  Temp: 98.7 F (37.1 C)  Resp: 18     Body mass index is 31.63 kg/(m^2).    ECOG FS:2 - Symptomatic, <50% confined to bed  Sclerae unicteric, pupils equal and reactive Oropharynx clear and slightly dry No cervical or supraclavicular adenopathy Lungs no rales or rhonchi Heart regular  rate and rhythm Abd soft, obese, nontender, increased bowel sounds MSK no focal spinal tenderness, no upper extremity lymphedema Neuro: nonfocal, well oriented, despondent affect Breasts: Deferred Skin: The patient's rash has resolved  LAB RESULTS:  CMP     Component Value Date/Time   NA 140 05/27/2014 1025   NA 140 03/13/2009 2325   K 3.4* 05/27/2014 1025   K 3.7 03/13/2009 2325   CL 105 03/13/2009 2325   CO2 24 05/27/2014 1025   CO2 29 03/13/2009 2325   GLUCOSE 139 05/27/2014 1025   GLUCOSE 107* 03/13/2009 2325   GLUCOSE 88 09/22/2006 1056   BUN 7.2 05/27/2014 1025   BUN 12 03/13/2009 2325   CREATININE 0.7 05/27/2014 1025   CREATININE 0.76 03/13/2009  2325   CALCIUM 9.7 05/27/2014 1025   CALCIUM 9.4 03/13/2009 2325   PROT 6.8 05/27/2014 1025   PROT 7.1 03/13/2009 2325   ALBUMIN 3.9 05/27/2014 1025   ALBUMIN 4.1 03/13/2009 2325   AST 28 05/27/2014 1025   AST 136* 03/13/2009 2325   ALT 51 05/27/2014 1025   ALT 303* 03/13/2009 2325   ALKPHOS 65 05/27/2014 1025   ALKPHOS 131* 03/13/2009 2325   BILITOT 0.40 05/27/2014 1025   BILITOT 0.5 03/13/2009 2325   GFRNONAA >60 03/13/2009 2325   GFRAA  Value: >60        The eGFR has been calculated using the MDRD equation. This calculation has not been validated in all clinical situations. eGFR's persistently <60 mL/min signify possible Chronic Kidney Disease. 03/13/2009 2325    I No results found for this basename: SPEP,  UPEP,   kappa and lambda light chains    Lab Results  Component Value Date   WBC 10.2 06/03/2014   NEUTROABS 8.8* 06/03/2014   HGB 11.7 06/03/2014   HCT 35.2 06/03/2014   MCV 95.9 06/03/2014   PLT 309 06/03/2014      Chemistry      Component Value Date/Time   NA 140 05/27/2014 1025   NA 140 03/13/2009 2325   K 3.4* 05/27/2014 1025   K 3.7 03/13/2009 2325   CL 105 03/13/2009 2325   CO2 24 05/27/2014 1025   CO2 29 03/13/2009 2325   BUN 7.2 05/27/2014 1025   BUN 12 03/13/2009 2325   CREATININE 0.7 05/27/2014 1025   CREATININE 0.76 03/13/2009 2325       Component Value Date/Time   CALCIUM 9.7 05/27/2014 1025   CALCIUM 9.4 03/13/2009 2325   ALKPHOS 65 05/27/2014 1025   ALKPHOS 131* 03/13/2009 2325   AST 28 05/27/2014 1025   AST 136* 03/13/2009 2325   ALT 51 05/27/2014 1025   ALT 303* 03/13/2009 2325   BILITOT 0.40 05/27/2014 1025   BILITOT 0.5 03/13/2009 2325       No results found for this basename: LABCA2    No components found with this basename: [id number]    No results found for this basename: INR,  in the last 168 hours  Urinalysis    Component Value Date/Time   COLORURINE YELLOW 03/13/2009 2251   APPEARANCEUR CLOUDY* 03/13/2009 2251   LABSPEC 1.018 03/13/2009 2251   PHURINE 5.5 03/13/2009 2251   GLUCOSEU NEGATIVE 03/13/2009 2251   HGBUR NEGATIVE 03/13/2009 2251   BILIRUBINUR NEGATIVE 03/13/2009 2251   KETONESUR NEGATIVE 03/13/2009 2251   PROTEINUR NEGATIVE 03/13/2009 2251   UROBILINOGEN 0.2 03/13/2009 2251   NITRITE NEGATIVE 03/13/2009 2251   LEUKOCYTESUR SMALL* 03/13/2009 2251    STUDIES: No results found.  ASSESSMENT: 53 y.o.  woman status post left breast biopsy 02/08/2014 for a clinical T1c N0, stage IA invasive ductal carcinoma, grade 3, triple negative, with an MIB-1 of 50%.  (1) status post left lumpectomy and sentinel lymph node sampling 03/01/2014 for a pT1c pN0, stage IA invasive ductal carcinoma, grade 3, with negative margins, repeat prognostic panel again triple negative.  (2) adjuvant chemotherapy started a 03/25/2014 consisting of cyclophosphamide and doxorubicin given in dose dense fashion x4, completed 05/06/2014; followed by weekly paclitaxel x1, on 05/27/2014, poorly tolerated; switched to Abraxane with second dose, starting 06/10/2014, with 11 Abraxane doses planned  (3) adjuvant radiation to follow chemotherapy  (4) skin rash--resolved  (5) question of sleep apnea  PLAN: Marcelyn had a very severe side effects from  the Taxol. She is not going to tolerate any more doses and then switching her  to Abraxane. She understands Abraxane is Taxol delivered in a different form.  I do not anticipate that she will have the bony aches and pains, severe diarrhea, and other problems she experienced this past week.  I do not think we can treat her today, so her first Abraxane dose will be a week from now.  I do think a lot of her symptoms will improve if she has a little bit of dexamethasone on board. She will take 4 mg this morning and 2 mg this evening, then 4 mg to more morning and 2 mg day after tomorrow morning. She will stop after that. She will take all these doses with food. I think a simple measure will go a long way to relieving her current symptoms.  I gave her a C. difficile kit, but the fact that she hasn't had a bowel movement since the middle of the afternoon yesterday suggests we are not dealing with C. difficile colitis.  Ronin has a good understanding of the overall plan. She agrees with that. She knows the goal of treatment in her case is cure. She will call with any problems that may develop before her next visit here.     Chauncey Cruel, MD    06/03/2014 10:32 AM

## 2014-06-07 ENCOUNTER — Telehealth: Payer: Self-pay

## 2014-06-07 ENCOUNTER — Telehealth: Payer: Self-pay | Admitting: Oncology

## 2014-06-07 NOTE — Telephone Encounter (Signed)
LMOVM - returning pt call.  Pt to call back to clinic.

## 2014-06-07 NOTE — Telephone Encounter (Signed)
LMOVM - called on cell per pt instructions.  Went to VM.  Pt to return call to clinic.

## 2014-06-07 NOTE — Telephone Encounter (Signed)
pt lmonvm re not being able to have tx friday 9/4 due to reaction and pain she is having. message taken to desk nurse.

## 2014-06-10 ENCOUNTER — Other Ambulatory Visit (HOSPITAL_BASED_OUTPATIENT_CLINIC_OR_DEPARTMENT_OTHER): Payer: 59

## 2014-06-10 ENCOUNTER — Ambulatory Visit (HOSPITAL_BASED_OUTPATIENT_CLINIC_OR_DEPARTMENT_OTHER): Payer: 59 | Admitting: Nurse Practitioner

## 2014-06-10 ENCOUNTER — Encounter: Payer: Self-pay | Admitting: Nurse Practitioner

## 2014-06-10 ENCOUNTER — Ambulatory Visit (HOSPITAL_BASED_OUTPATIENT_CLINIC_OR_DEPARTMENT_OTHER): Payer: 59

## 2014-06-10 VITALS — BP 144/81 | HR 101 | Temp 98.8°F | Resp 18 | Ht 65.0 in | Wt 192.4 lb

## 2014-06-10 DIAGNOSIS — Z5111 Encounter for antineoplastic chemotherapy: Secondary | ICD-10-CM

## 2014-06-10 DIAGNOSIS — T451X5A Adverse effect of antineoplastic and immunosuppressive drugs, initial encounter: Secondary | ICD-10-CM

## 2014-06-10 DIAGNOSIS — R1011 Right upper quadrant pain: Secondary | ICD-10-CM

## 2014-06-10 DIAGNOSIS — C50212 Malignant neoplasm of upper-inner quadrant of left female breast: Secondary | ICD-10-CM

## 2014-06-10 DIAGNOSIS — R110 Nausea: Secondary | ICD-10-CM | POA: Insufficient documentation

## 2014-06-10 DIAGNOSIS — C50219 Malignant neoplasm of upper-inner quadrant of unspecified female breast: Secondary | ICD-10-CM

## 2014-06-10 DIAGNOSIS — R197 Diarrhea, unspecified: Secondary | ICD-10-CM | POA: Insufficient documentation

## 2014-06-10 DIAGNOSIS — Z171 Estrogen receptor negative status [ER-]: Secondary | ICD-10-CM

## 2014-06-10 DIAGNOSIS — R1084 Generalized abdominal pain: Secondary | ICD-10-CM | POA: Insufficient documentation

## 2014-06-10 DIAGNOSIS — R109 Unspecified abdominal pain: Secondary | ICD-10-CM | POA: Insufficient documentation

## 2014-06-10 LAB — COMPREHENSIVE METABOLIC PANEL (CC13)
ALT: 44 U/L (ref 0–55)
AST: 23 U/L (ref 5–34)
Albumin: 3.7 g/dL (ref 3.5–5.0)
Alkaline Phosphatase: 67 U/L (ref 40–150)
Anion Gap: 15 mEq/L — ABNORMAL HIGH (ref 3–11)
BUN: 12.1 mg/dL (ref 7.0–26.0)
CALCIUM: 9.8 mg/dL (ref 8.4–10.4)
CHLORIDE: 103 meq/L (ref 98–109)
CO2: 22 mEq/L (ref 22–29)
Creatinine: 0.8 mg/dL (ref 0.6–1.1)
GLUCOSE: 180 mg/dL — AB (ref 70–140)
POTASSIUM: 3.4 meq/L — AB (ref 3.5–5.1)
Sodium: 140 mEq/L (ref 136–145)
Total Bilirubin: 0.63 mg/dL (ref 0.20–1.20)
Total Protein: 7 g/dL (ref 6.4–8.3)

## 2014-06-10 LAB — CBC WITH DIFFERENTIAL/PLATELET
BASO%: 0.9 % (ref 0.0–2.0)
Basophils Absolute: 0.1 10*3/uL (ref 0.0–0.1)
EOS%: 4.4 % (ref 0.0–7.0)
Eosinophils Absolute: 0.3 10*3/uL (ref 0.0–0.5)
HEMATOCRIT: 34.6 % — AB (ref 34.8–46.6)
HGB: 11.4 g/dL — ABNORMAL LOW (ref 11.6–15.9)
LYMPH%: 7.9 % — AB (ref 14.0–49.7)
MCH: 31.7 pg (ref 25.1–34.0)
MCHC: 32.9 g/dL (ref 31.5–36.0)
MCV: 96.5 fL (ref 79.5–101.0)
MONO#: 0.6 10*3/uL (ref 0.1–0.9)
MONO%: 8.8 % (ref 0.0–14.0)
NEUT#: 5.5 10*3/uL (ref 1.5–6.5)
NEUT%: 78 % — AB (ref 38.4–76.8)
PLATELETS: 218 10*3/uL (ref 145–400)
RBC: 3.59 10*6/uL — ABNORMAL LOW (ref 3.70–5.45)
RDW: 17 % — ABNORMAL HIGH (ref 11.2–14.5)
WBC: 7 10*3/uL (ref 3.9–10.3)
lymph#: 0.6 10*3/uL — ABNORMAL LOW (ref 0.9–3.3)

## 2014-06-10 LAB — CLOSTRIDIUM DIFFICILE BY PCR: Toxigenic C. Difficile by PCR: NEGATIVE

## 2014-06-10 MED ORDER — ONDANSETRON 8 MG/50ML IVPB (CHCC)
8.0000 mg | Freq: Once | INTRAVENOUS | Status: AC
  Administered 2014-06-10: 8 mg via INTRAVENOUS

## 2014-06-10 MED ORDER — ONDANSETRON 8 MG/NS 50 ML IVPB
INTRAVENOUS | Status: AC
Start: 2014-06-10 — End: 2014-06-10
  Filled 2014-06-10: qty 8

## 2014-06-10 MED ORDER — HEPARIN SOD (PORK) LOCK FLUSH 100 UNIT/ML IV SOLN
500.0000 [IU] | Freq: Once | INTRAVENOUS | Status: AC | PRN
  Administered 2014-06-10: 500 [IU]
  Filled 2014-06-10: qty 5

## 2014-06-10 MED ORDER — SODIUM CHLORIDE 0.9 % IJ SOLN
10.0000 mL | INTRAMUSCULAR | Status: DC | PRN
  Administered 2014-06-10: 10 mL
  Filled 2014-06-10: qty 10

## 2014-06-10 MED ORDER — PACLITAXEL PROTEIN-BOUND CHEMO INJECTION 100 MG
100.0000 mg/m2 | Freq: Once | INTRAVENOUS | Status: AC
  Administered 2014-06-10: 200 mg via INTRAVENOUS
  Filled 2014-06-10: qty 40

## 2014-06-10 MED ORDER — SODIUM CHLORIDE 0.9 % IV SOLN
Freq: Once | INTRAVENOUS | Status: AC
  Administered 2014-06-10: 09:00:00 via INTRAVENOUS

## 2014-06-10 MED ORDER — DEXAMETHASONE SODIUM PHOSPHATE 10 MG/ML IJ SOLN
10.0000 mg | Freq: Once | INTRAMUSCULAR | Status: AC
  Administered 2014-06-10: 10 mg via INTRAVENOUS

## 2014-06-10 MED ORDER — DEXAMETHASONE SODIUM PHOSPHATE 10 MG/ML IJ SOLN
INTRAMUSCULAR | Status: AC
  Filled 2014-06-10: qty 1

## 2014-06-10 NOTE — Progress Notes (Signed)
[facility]  Telephone:(336) [phone] Fax:(336) [phone]     ID: Alexandria Bradley OB: 01-20-1961  MR#: [id number]  FHQ#:[id number]  PCP: Alexandria Kim, MD GYN:   SU: Alexandria Bradley OTHER MD: Alexandria Bradley, Alexandria Bradley, Alexandria Bradley  CHIEF COMPLAINT: Triple negative breast cancer  CURRENT TREATMENT: Adjuvant chemotherapy  BREAST CANCER HISTORY: From the original intake note of 02/16/2014:  Alexandria Bradley palpated a mass in her left breast early May and brought it immediately to her gynecologist attention. He set her up for bilateral diagnostic mammography and left ultrasonography at [facility] 02/07/2014. Mammography showed a 1.3 cm oval mass with spiculated margins in the left breast at the 11:00 position. This was palpable. Ultrasound showed a 1.1 cm tall her than wide mass with a microlobulated margins. He was hypoechoic. There were no abnormalities noted in the left axilla.  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 Lyndon Bradley at conference 02/16/2014), triple negative, with an MIB-1 of 50%.  The patient's subsequent history is as detailed below  INTERVAL HISTORY: Alexandria Bradley  returns today for follow up of her breast cancer. Today is day 1 cycle 1 of 11 planned weekly doses of abraxane, given on days 1, 8, and 15. Alexandria Bradley was switched from paclitaxel to abraxane because of poor tolerance. She developed cramps, diarrhea, nausea, and severe body aches after her first dose of paclitaxel. She states she is feeling better today. She has had 2-3 stools per day for the last couple of days. They range from loose to watery and mucous-like.Alexandria Bradley tried imodium a few times, but claims it didn't work so she stopped taking it. She dropped off a stool sample this morning to test for c.diff. Alexandria Bradley still experiences nausea, but has not vomited. She is fatigued, but her  appetite has returned somewhat. She complains of right abdominal pain and has a history of a cholecystectomy. This pain is centered mainly in the right upper quadrant and is not present all of the time. However, it is dull and when it does occur, can last for a few hours.  REVIEW OF SYSTEMS: Alexandria Bradley denies fevers, chills, chest pain, or palpitations. She has occasional shortness of breath with exertion. A detailed review of systems is negative except where noted above.   PAST MEDICAL HISTORY: Past Medical History  Diagnosis Date  . Thyroid disease   . Osteoporosis   . Psoriasis   . Breast cancer   . Arthritis   . Back pain   . Hypothyroidism   . Anxiety   . Breast cancer     left    PAST SURGICAL HISTORY: Past Surgical History  Procedure Laterality Date  . Cholecystectomy    . Tonsillectomy    . Tubal ligation    . Myomectomy    . Lipoma excision    . Portacath placement N/A 03/01/2014    Procedure: INSERTION PORT-A-CATH;  Surgeon: Alexandria Bookbinder, MD;  Location: [facility];  Service: General;  Laterality: N/A;    FAMILY HISTORY Family History  Problem Relation Age of Onset  . Endometrial cancer Mother   . Lung cancer Father   . Brain cancer Father    the patient's father died at the age of 18 from what may have been metastatic lung cancer. The patient knows very little about the father's side of her family. Her mother is alive at age 16. She was recently diagnosed  with endometrial cancer. The patient has one brother, 2 sisters. There is no history of breast or ovarian cancer in the family.  GYNECOLOGIC HISTORY:  Menarche age 65, first live birth age 89. The patient is GX P3. One child died shortly after birth. She stopped having periods approximately 2005. She did not use hormone replacement. 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 Her husband  Alexandria Bradley is a Insurance underwriter. Son Alexandria Bradley is that she Nurse, adult in [place]. Daughter Alexandria Bradley lives in [place] and works for [employer] as an Therapist, sports.    ADVANCED DIRECTIVES: Not in place   HEALTH MAINTENANCE: History  Substance Use Topics  . Smoking status: Former Research scientist (life sciences)  . Smokeless tobacco: Not on file  . Alcohol Use: Yes     Comment: social     Colonoscopy: 2010  PAP: 2013  Bone density: 05/15/2009, at [place], normal, lowest T score -0.8  Lipid panel:  Allergies  Allergen Reactions  . Ciprofloxacin Anxiety and Rash  . Prednisone Shortness Of Breath    "Alexandria Bradley me up" jittery  . Codeine Nausea Only    Nausea   . Cortisone Swelling    "jack me up"    Current Outpatient Prescriptions  Medication Sig Dispense Refill  . acetaminophen (TYLENOL) 500 MG tablet Take 500 mg by mouth every 6 (six) hours as needed for headache.      . Alum & Mag Hydroxide-Simeth (MAGIC MOUTHWASH W/LIDOCAINE) SOLN 5-10 ml QID prn  240 mL  3  . amLODipine (NORVASC) 5 MG tablet       . clonazePAM (KLONOPIN) 0.5 MG tablet Take 0.5 mg by mouth 2 (two) times daily as needed for anxiety.      Marland Kitchen HYDROcodone-acetaminophen (HYCET) 7.5-325 mg/15 ml solution Take 10 mLs by mouth 4 (four) times daily as needed for moderate pain.  120 mL  0  . liothyronine (CYTOMEL) 5 MCG tablet       . loratadine (CLARITIN) 10 MG tablet Take 10 mg by mouth daily.      . methylPREDNISolone (MEDROL DOSEPAK) 4 MG tablet follow package directions  21 tablet  0  . ondansetron (ZOFRAN ODT) 4 MG disintegrating tablet Take 1 tablet (4 mg total) by mouth every 8 (eight) hours as needed for nausea or vomiting.  20 tablet  0  . SYNTHROID 137 MCG tablet       . triamterene-hydrochlorothiazide (MAXZIDE-25) 37.5-25 MG per tablet        No current facility-administered medications for this visit.    OBJECTIVE: Middle-aged white woman who appears fatigued  Filed Vitals:   06/10/14 0844  BP: 144/81  Pulse: 101   Temp: 98.8 F (37.1 C)  Resp: 18     Body mass index is 32.02 kg/(m^2).    ECOG FS:1 - Symptomatic but completely ambulatory  Skin: warm, dry, clear of previous rash HEENT: sclerae anicteric, conjunctivae pink, oropharynx clear. No thrush or mucositis.  Lymph Nodes: No cervical or supraclavicular lymphadenopathy  Lungs: clear to auscultation bilaterally, no rales, wheezes, or rhonci  Heart: regular rate and rhythm  Abdomen: round, soft, non tender, no rebound tenderness, positive bowel sounds  Musculoskeletal: No focal spinal tenderness, no peripheral edema  Neuro: non focal, well oriented, positive affect  Breasts: deferred  LAB RESULTS:  CMP     Component Value Date/Time   NA 140 06/10/2014 1448  NA 140 03/13/2009 2325   K 3.4* 06/10/2014 0822   K 3.7 03/13/2009 2325   CL 105 03/13/2009 2325   CO2 22 06/10/2014 0822   CO2 29 03/13/2009 2325   GLUCOSE 180* 06/10/2014 0822   GLUCOSE 107* 03/13/2009 2325   GLUCOSE 88 09/22/2006 1056   BUN 12.1 06/10/2014 0822   BUN 12 03/13/2009 2325   CREATININE 0.8 06/10/2014 0822   CREATININE 0.76 03/13/2009 2325   CALCIUM 9.8 06/10/2014 0822   CALCIUM 9.4 03/13/2009 2325   PROT 7.0 06/10/2014 0822   PROT 7.1 03/13/2009 2325   ALBUMIN 3.7 06/10/2014 0822   ALBUMIN 4.1 03/13/2009 2325   AST 23 06/10/2014 0822   AST 136* 03/13/2009 2325   ALT 44 06/10/2014 0822   ALT 303* 03/13/2009 2325   ALKPHOS 67 06/10/2014 0822   ALKPHOS 131* 03/13/2009 2325   BILITOT 0.63 06/10/2014 0822   BILITOT 0.5 03/13/2009 2325   GFRNONAA >60 03/13/2009 2325   GFRAA  Value: >60        The eGFR has been calculated using the MDRD equation. This calculation has not been validated in all clinical situations. eGFR's persistently <60 mL/min signify possible Chronic Kidney Disease. 03/13/2009 2325    I No results found for this basename: SPEP,  UPEP,   kappa and lambda light chains    Lab Results  Component Value Date   WBC 7.0 06/10/2014   NEUTROABS 5.5 06/10/2014   HGB 11.4*  06/10/2014   HCT 34.6* 06/10/2014   MCV 96.5 06/10/2014   PLT 218 06/10/2014      Chemistry      Component Value Date/Time   NA 140 06/10/2014 0822   NA 140 03/13/2009 2325   K 3.4* 06/10/2014 0822   K 3.7 03/13/2009 2325   CL 105 03/13/2009 2325   CO2 22 06/10/2014 0822   CO2 29 03/13/2009 2325   BUN 12.1 06/10/2014 0822   BUN 12 03/13/2009 2325   CREATININE 0.8 06/10/2014 0822   CREATININE 0.76 03/13/2009 2325      Component Value Date/Time   CALCIUM 9.8 06/10/2014 0822   CALCIUM 9.4 03/13/2009 2325   ALKPHOS 67 06/10/2014 0822   ALKPHOS 131* 03/13/2009 2325   AST 23 06/10/2014 0822   AST 136* 03/13/2009 2325   ALT 44 06/10/2014 0822   ALT 303* 03/13/2009 2325   BILITOT 0.63 06/10/2014 0822   BILITOT 0.5 03/13/2009 2325       No results found for this basename: LABCA2    No components found with this basename: [id number]    No results found for this basename: INR,  in the last 168 hours  Urinalysis    Component Value Date/Time   COLORURINE YELLOW 03/13/2009 2251   APPEARANCEUR CLOUDY* 03/13/2009 2251   LABSPEC 1.018 03/13/2009 2251   PHURINE 5.5 03/13/2009 [employer] 03/13/2009 2251   HGBUR NEGATIVE 03/13/2009 2251   BILIRUBINUR NEGATIVE 03/13/2009 2251   KETONESUR NEGATIVE 03/13/2009 2251   PROTEINUR NEGATIVE 03/13/2009 2251   UROBILINOGEN 0.2 03/13/2009 2251   NITRITE NEGATIVE 03/13/2009 2251   LEUKOCYTESUR SMALL* 03/13/2009 2251    STUDIES: No results found.  ASSESSMENT: 53 y.o. [place] woman status post left breast biopsy 02/08/2014 for a clinical T1c N0, stage IA invasive ductal carcinoma, grade 3, triple negative, with an MIB-1 of 50%.  (1) status post left lumpectomy and sentinel lymph node sampling 03/01/2014 for a pT1c pN0, stage IA invasive ductal carcinoma, grade 3, with negative margins,  repeat prognostic panel again triple negative.  (2) adjuvant chemotherapy started a 03/25/2014 consisting of cyclophosphamide and doxorubicin given in dose dense fashion  x4, completed 05/06/2014; followed by weekly paclitaxel x1, on 05/27/2014, poorly tolerated; switched to Abraxane with second dose, starting 06/10/2014, with 11 Abraxane doses planned  (3) adjuvant radiation to follow chemotherapy  (4) skin rash--resolved  (5) question of sleep apnea  PLAN: Mieko feels better today and is ready to proceed with day 1, cycle 1 of the abraxane. The labs were reviewed in detail with her and was stable. We discussed her anti-emetic schedule centering on the usage of compazine with zofran for breakthrough nausea. We will attempt to avoid oral dexamethasone with this regimen.   As far as her diarrhea is concerned, as stated above, the imodium was ineffective for her. She had constipation with her previous chemo regimen, and would rather not try any other antidiarrheal medications that may tip the scale in that direction. She prefers to manage this on her own and I feel that she is handling it well. I advised Annamae to stay hydrated and to avoid fatty foods in an attempt to ease her right upper quadrant pain if it is at all is bile related, as she is status post cholecystectomy.   Aliene will return for labs and an office visit before the start of cycle 2 next week. She understands and agrees with the plan. She has been encouraged to call with any issues that might arise before here next visit here.  Marcelino Duster, NP 06/10/2014 9:12 AM

## 2014-06-10 NOTE — Patient Instructions (Signed)
[facility] Discharge Instructions for Patients Receiving Chemotherapy  Today you received the following chemotherapy agents: Abraxane  To help prevent nausea and vomiting after your treatment, we encourage you to take your nausea medication as prescribed by your physician.    If you develop nausea and vomiting that is not controlled by your nausea medication, call the clinic.   BELOW ARE SYMPTOMS THAT SHOULD BE REPORTED IMMEDIATELY:  *FEVER GREATER THAN 100.5 F  *CHILLS WITH OR WITHOUT FEVER  NAUSEA AND VOMITING THAT IS NOT CONTROLLED WITH YOUR NAUSEA MEDICATION  *UNUSUAL SHORTNESS OF BREATH  *UNUSUAL BRUISING OR BLEEDING  TENDERNESS IN MOUTH AND THROAT WITH OR WITHOUT PRESENCE OF ULCERS  *URINARY PROBLEMS  *BOWEL PROBLEMS  UNUSUAL RASH Items with * indicate a potential emergency and should be followed up as soon as possible.  Feel free to call the clinic you have any questions or concerns. The clinic phone number is [phone].  Nanoparticle Albumin-Bound Paclitaxel injection (Abraxane) What is this medicine? NANOPARTICLE ALBUMIN-BOUND PACLITAXEL (Na no PAHR ti kuhl al BYOO muhn-bound PAK li TAX el) is a chemotherapy drug. It targets fast dividing cells, like cancer cells, and causes these cells to die. This medicine is used to treat advanced breast cancer and advanced lung cancer. This medicine may be used for other purposes; ask your health care provider or pharmacist if you have questions. COMMON BRAND NAME(S): Abraxane What should I tell my health care provider before I take this medicine? They need to know if you have any of these conditions: -kidney disease -liver disease -low blood counts, like low platelets, red blood cells, or white blood cells -recent or ongoing radiation therapy -an unusual or allergic reaction to paclitaxel, albumin, other chemotherapy, other medicines, foods, dyes, or preservatives -pregnant or trying to get  pregnant -breast-feeding How should I use this medicine? This drug is given as an infusion into a vein. It is administered in a hospital or clinic by a specially trained health care professional. Talk to your pediatrician regarding the use of this medicine in children. Special care may be needed. Overdosage: If you think you have taken too much of this medicine contact a poison control center or emergency room at once. NOTE: This medicine is only for you. Do not share this medicine with others. What if I miss a dose? It is important not to miss your dose. Call your doctor or health care professional if you are unable to keep an appointment. What may interact with this medicine? -cyclosporine -diazepam -ketoconazole -medicines to increase blood counts like filgrastim, pegfilgrastim, sargramostim -other chemotherapy drugs like cisplatin, doxorubicin, epirubicin, etoposide, teniposide, vincristine -quinidine -testosterone -vaccines -verapamil Talk to your doctor or health care professional before taking any of these medicines: -acetaminophen -aspirin -ibuprofen -ketoprofen -naproxen This list may not describe all possible interactions. Give your health care provider a list of all the medicines, herbs, non-prescription drugs, or dietary supplements you use. Also tell them if you smoke, drink alcohol, or use illegal drugs. Some items may interact with your medicine. What should I watch for while using this medicine? Your condition will be monitored carefully while you are receiving this medicine. You will need important blood work done while you are taking this medicine. This drug may make you feel generally unwell. This is not uncommon, as chemotherapy can affect healthy cells as well as cancer cells. Report any side effects. Continue your course of treatment even though you feel ill unless your doctor tells you to stop.  In some cases, you may be given additional medicines to help with side  effects. Follow all directions for their use. Call your doctor or health care professional for advice if you get a fever, chills or sore throat, or other symptoms of a cold or flu. Do not treat yourself. This drug decreases your body's ability to fight infections. Try to avoid being around people who are sick. This medicine may increase your risk to bruise or bleed. Call your doctor or health care professional if you notice any unusual bleeding. Be careful brushing and flossing your teeth or using a toothpick because you may get an infection or bleed more easily. If you have any dental work done, tell your dentist you are receiving this medicine. Avoid taking products that contain aspirin, acetaminophen, ibuprofen, naproxen, or ketoprofen unless instructed by your doctor. These medicines may hide a fever. Do not become pregnant while taking this medicine. Women should inform their doctor if they wish to become pregnant or think they might be pregnant. There is a potential for serious side effects to an unborn child. Talk to your health care professional or pharmacist for more information. Do not breast-feed an infant while taking this medicine. Men are advised not to father a child while receiving this medicine. What side effects may I notice from receiving this medicine? Side effects that you should report to your doctor or health care professional as soon as possible: -allergic reactions like skin rash, itching or hives, swelling of the face, lips, or tongue -low blood counts - This drug may decrease the number of white blood cells, red blood cells and platelets. You may be at increased risk for infections and bleeding. -signs of infection - fever or chills, cough, sore throat, pain or difficulty passing urine -signs of decreased platelets or bleeding - bruising, pinpoint red spots on the skin, black, tarry stools, nosebleeds -signs of decreased red blood cells - unusually weak or tired, fainting  spells, lightheadedness -breathing problems -changes in vision -chest pain -high or low blood pressure -mouth sores -nausea and vomiting -pain, swelling, redness or irritation at the injection site -pain, tingling, numbness in the hands or feet -slow or irregular heartbeat -swelling of the ankle, feet, hands Side effects that usually do not require medical attention (report to your doctor or health care professional if they continue or are bothersome): -aches, pains -changes in the color of fingernails -diarrhea -hair loss -loss of appetite This list may not describe all possible side effects. Call your doctor for medical advice about side effects. You may report side effects to FDA at 1-800-FDA-1088. Where should I keep my medicine? This drug is given in a hospital or clinic and will not be stored at home. NOTE: This sheet is a summary. It may not cover all possible information. If you have questions about this medicine, talk to your doctor, pharmacist, or health care provider.  2015, Elsevier/Gold Standard. (2012-11-09 16:48:50)

## 2014-06-13 ENCOUNTER — Telehealth: Payer: Self-pay | Admitting: *Deleted

## 2014-06-13 NOTE — Telephone Encounter (Signed)
Body aches are responding to liquid vicodin (left over from surgery) + ibuprofen she took yesterday and today, whereas it did not respond to this with Taxol. Still having 3-4 loose stools/day (none today), but hopes vicodin may slow that down. Made her aware that her stool was negative for C. Diff.

## 2014-06-15 ENCOUNTER — Other Ambulatory Visit: Payer: Self-pay | Admitting: Emergency Medicine

## 2014-06-15 ENCOUNTER — Ambulatory Visit (HOSPITAL_BASED_OUTPATIENT_CLINIC_OR_DEPARTMENT_OTHER): Payer: Self-pay | Admitting: Nurse Practitioner

## 2014-06-15 ENCOUNTER — Other Ambulatory Visit: Payer: Self-pay

## 2014-06-15 ENCOUNTER — Ambulatory Visit (HOSPITAL_BASED_OUTPATIENT_CLINIC_OR_DEPARTMENT_OTHER): Payer: 59

## 2014-06-15 ENCOUNTER — Telehealth: Payer: Self-pay | Admitting: Emergency Medicine

## 2014-06-15 VITALS — BP 123/77 | HR 109 | Temp 98.9°F | Resp 17 | Ht 65.0 in | Wt 188.4 lb

## 2014-06-15 DIAGNOSIS — R197 Diarrhea, unspecified: Secondary | ICD-10-CM

## 2014-06-15 DIAGNOSIS — C50219 Malignant neoplasm of upper-inner quadrant of unspecified female breast: Secondary | ICD-10-CM

## 2014-06-15 DIAGNOSIS — C50212 Malignant neoplasm of upper-inner quadrant of left female breast: Secondary | ICD-10-CM

## 2014-06-15 DIAGNOSIS — R509 Fever, unspecified: Secondary | ICD-10-CM | POA: Insufficient documentation

## 2014-06-15 DIAGNOSIS — J069 Acute upper respiratory infection, unspecified: Secondary | ICD-10-CM

## 2014-06-15 LAB — COMPREHENSIVE METABOLIC PANEL (CC13)
ALBUMIN: 3.9 g/dL (ref 3.5–5.0)
ALT: 49 U/L (ref 0–55)
AST: 24 U/L (ref 5–34)
Alkaline Phosphatase: 75 U/L (ref 40–150)
Anion Gap: 13 mEq/L — ABNORMAL HIGH (ref 3–11)
BUN: 12.5 mg/dL (ref 7.0–26.0)
CHLORIDE: 103 meq/L (ref 98–109)
CO2: 22 mEq/L (ref 22–29)
Calcium: 9.8 mg/dL (ref 8.4–10.4)
Creatinine: 0.8 mg/dL (ref 0.6–1.1)
GLUCOSE: 118 mg/dL (ref 70–140)
POTASSIUM: 3.7 meq/L (ref 3.5–5.1)
Sodium: 138 mEq/L (ref 136–145)
Total Bilirubin: 0.81 mg/dL (ref 0.20–1.20)
Total Protein: 7.2 g/dL (ref 6.4–8.3)

## 2014-06-15 LAB — CBC WITH DIFFERENTIAL/PLATELET
BASO%: 0.9 % (ref 0.0–2.0)
BASOS ABS: 0.1 10*3/uL (ref 0.0–0.1)
EOS ABS: 0.2 10*3/uL (ref 0.0–0.5)
EOS%: 3 % (ref 0.0–7.0)
HEMATOCRIT: 33 % — AB (ref 34.8–46.6)
HEMOGLOBIN: 10.9 g/dL — AB (ref 11.6–15.9)
LYMPH#: 0.3 10*3/uL — AB (ref 0.9–3.3)
LYMPH%: 4.2 % — ABNORMAL LOW (ref 14.0–49.7)
MCH: 31.8 pg (ref 25.1–34.0)
MCHC: 33.1 g/dL (ref 31.5–36.0)
MCV: 96.2 fL (ref 79.5–101.0)
MONO#: 0.1 10*3/uL (ref 0.1–0.9)
MONO%: 2.3 % (ref 0.0–14.0)
NEUT#: 5.8 10*3/uL (ref 1.5–6.5)
NEUT%: 89.6 % — ABNORMAL HIGH (ref 38.4–76.8)
Platelets: 315 10*3/uL (ref 145–400)
RBC: 3.43 10*6/uL — ABNORMAL LOW (ref 3.70–5.45)
RDW: 16.4 % — ABNORMAL HIGH (ref 11.2–14.5)
WBC: 6.5 10*3/uL (ref 3.9–10.3)

## 2014-06-15 LAB — URINALYSIS, MICROSCOPIC - CHCC
Blood: NEGATIVE
GLUCOSE UR CHCC: NEGATIVE mg/dL
Ketones: NEGATIVE mg/dL
NITRITE: NEGATIVE
Protein: 30 mg/dL
RBC / HPF: NEGATIVE (ref 0–2)
Specific Gravity, Urine: 1.02 (ref 1.003–1.035)
Urobilinogen, UR: 0.2 mg/dL (ref 0.2–1)
pH: 6 (ref 4.6–8.0)

## 2014-06-15 MED ORDER — SULFAMETHOXAZOLE-TRIMETHOPRIM 800-160 MG PO TABS: 1.0000 | ORAL_TABLET | Freq: Two times a day (BID) | ORAL | Status: DC

## 2014-06-15 NOTE — Telephone Encounter (Signed)
Patient calls stating she woke up this am with fever; reading 101.6 the last she took her temp. Instructed patient to take Tylenol or Motrin for fever. Gave patient appointment to see Selena Lesser NP today at 10:45 with labs prior. Patient states she has had some sinus drainage but denies any other complaints.

## 2014-06-17 ENCOUNTER — Other Ambulatory Visit: Payer: 59

## 2014-06-17 ENCOUNTER — Ambulatory Visit (HOSPITAL_BASED_OUTPATIENT_CLINIC_OR_DEPARTMENT_OTHER): Payer: 59

## 2014-06-17 ENCOUNTER — Telehealth: Payer: Self-pay | Admitting: Oncology

## 2014-06-17 ENCOUNTER — Ambulatory Visit: Payer: 59

## 2014-06-17 ENCOUNTER — Ambulatory Visit (HOSPITAL_BASED_OUTPATIENT_CLINIC_OR_DEPARTMENT_OTHER): Payer: 59 | Admitting: Adult Health

## 2014-06-17 ENCOUNTER — Ambulatory Visit (HOSPITAL_COMMUNITY)
Admission: RE | Admit: 2014-06-17 | Discharge: 2014-06-17 | Disposition: A | Discharge status: Home / Self Care | Payer: 59 | Source: Ambulatory Visit | Attending: Adult Health | Admitting: Adult Health

## 2014-06-17 ENCOUNTER — Encounter: Payer: Self-pay | Admitting: Adult Health

## 2014-06-17 VITALS — BP 117/79 | HR 102 | Temp 98.4°F | Resp 18 | Ht 65.0 in | Wt 190.7 lb

## 2014-06-17 DIAGNOSIS — C50212 Malignant neoplasm of upper-inner quadrant of left female breast: Secondary | ICD-10-CM

## 2014-06-17 DIAGNOSIS — Z79899 Other long term (current) drug therapy: Secondary | ICD-10-CM | POA: Diagnosis not present

## 2014-06-17 DIAGNOSIS — R1011 Right upper quadrant pain: Secondary | ICD-10-CM | POA: Insufficient documentation

## 2014-06-17 DIAGNOSIS — Z452 Encounter for adjustment and management of vascular access device: Secondary | ICD-10-CM

## 2014-06-17 DIAGNOSIS — R509 Fever, unspecified: Secondary | ICD-10-CM

## 2014-06-17 DIAGNOSIS — Z95828 Presence of other vascular implants and grafts: Secondary | ICD-10-CM

## 2014-06-17 DIAGNOSIS — R109 Unspecified abdominal pain: Secondary | ICD-10-CM

## 2014-06-17 DIAGNOSIS — C50219 Malignant neoplasm of upper-inner quadrant of unspecified female breast: Secondary | ICD-10-CM | POA: Insufficient documentation

## 2014-06-17 DIAGNOSIS — Z171 Estrogen receptor negative status [ER-]: Secondary | ICD-10-CM

## 2014-06-17 MED ORDER — IOHEXOL 300 MG/ML  SOLN
50.0000 mL | Freq: Once | INTRAMUSCULAR | Status: AC | PRN
  Administered 2014-06-17: 50 mL via ORAL

## 2014-06-17 MED ORDER — SODIUM CHLORIDE 0.9 % IJ SOLN
10.0000 mL | INTRAMUSCULAR | Status: DC | PRN
  Administered 2014-06-17: 10 mL via INTRAVENOUS
  Filled 2014-06-17: qty 10

## 2014-06-17 MED ORDER — LEVOFLOXACIN 750 MG PO TABS: 750.0000 mg | ORAL_TABLET | Freq: Every day | ORAL | Status: DC

## 2014-06-17 MED ORDER — IOHEXOL 300 MG/ML  SOLN
100.0000 mL | Freq: Once | INTRAMUSCULAR | Status: AC | PRN
  Administered 2014-06-17: 100 mL via INTRAVENOUS

## 2014-06-17 MED ORDER — HEPARIN SOD (PORK) LOCK FLUSH 100 UNIT/ML IV SOLN
500.0000 [IU] | Freq: Once | INTRAVENOUS | Status: AC
  Administered 2014-06-17: 500 [IU] via INTRAVENOUS
  Filled 2014-06-17: qty 5

## 2014-06-17 NOTE — Telephone Encounter (Signed)
gv pt appt schedule for sept thru nov. ct today already on schedule.

## 2014-06-17 NOTE — Progress Notes (Addendum)
[facility]  [phone] [phone]) [phone]     ID: Alexandria Bradley OB: 04-12-1961  MR#: [id number]  OVF#:[id number]  PCP: Alexandria Kim, MD GYN:   SU: Alexandria Bradley OTHER MD: Alexandria Bradley, Alexandria Bradley, Alexandria Bradley  CHIEF COMPLAINT: Triple negative breast cancer  CURRENT TREATMENT: Adjuvant chemotherapy  BREAST CANCER HISTORY: From the original intake note of 02/16/2014:  Alexandria Bradley palpated a mass in her left breast early May and brought it immediately to her gynecologist attention. He set her up for bilateral diagnostic mammography and left ultrasonography at [facility] 02/07/2014. Mammography showed a 1.3 cm oval mass with spiculated margins in the left breast at the 11:00 position. This was palpable. Ultrasound showed a 1.1 cm tall her than wide mass with a microlobulated margins. He was hypoechoic. There were no abnormalities noted in the left axilla.  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 Lyndon Bradley at conference 02/16/2014), triple negative, with an MIB-1 of 50%.  The patient's subsequent history is as detailed below  INTERVAL HISTORY: Alexandria Bradley  returns today for follow up of her breast cancer. She is here prior to Abraxane.  She has been having fevers for the past few days and was evaluated on Wednesday, 06/15/14 by my colleague Alexandria Lesser, NP for this.  She was started on Bactrim DS, and blood cultures, urinalysis was sent, however, she has not had a urine culture.  She continues to have right sided abdominal pain, and discomfort.  She does also feel like she may be getting a cold.  She has an occasional cough, but no chest pain, shortness of breath, nasal drainage, sinus pain.  She denies constipation, diarrhea, nausea, vomiting, or any other symptomatology.  She just has an overall unwell feeling.    REVIEW OF SYSTEMS: A  10 point review of systems was conducted and is otherwise negative except for what is noted above.      PAST MEDICAL HISTORY: Past Medical History  Diagnosis Date  . Thyroid disease   . Osteoporosis   . Psoriasis   . Breast cancer   . Arthritis   . Back pain   . Hypothyroidism   . Anxiety   . Breast cancer     left    PAST SURGICAL HISTORY: Past Surgical History  Procedure Laterality Date  . Cholecystectomy    . Tonsillectomy    . Tubal ligation    . Myomectomy    . Lipoma excision    . Portacath placement N/A 03/01/2014    Procedure: INSERTION PORT-A-CATH;  Surgeon: Alexandria Bookbinder, MD;  Location: [facility];  Service: General;  Laterality: N/A;    FAMILY HISTORY Family History  Problem Relation Age of Onset  . Endometrial cancer Mother   . Lung cancer Father   . Brain cancer Father    the patient's father died at the age of 48 from what may have been metastatic lung cancer. The patient knows very little about the father's side of her family. Her mother is alive at age 24. She was recently diagnosed with endometrial cancer. The patient has one brother, 2 sisters. There is no history of breast or ovarian cancer in the family.  GYNECOLOGIC HISTORY:  Menarche age 24, first live birth age 22. The patient is GX P3. One child died shortly after birth. She stopped having periods approximately 2005. She did not use hormone replacement.  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 Her husband Alexandria Bradley is a Insurance underwriter. Son Alexandria Bradley is that she Nurse, adult in [place]. Daughter Alexandria Bradley lives in [place] and works for [employer] as an Therapist, sports.    ADVANCED DIRECTIVES: Not in place   HEALTH MAINTENANCE: History  Substance Use Topics  . Smoking status: Former Research scientist (life sciences)  . Smokeless tobacco: Not on file  . Alcohol Use: Yes     Comment: social      Colonoscopy: 2010  PAP: 2013  Bone density: 05/15/2009, at [facility], normal, lowest T score -0.8  Lipid panel:  Allergies  Allergen Reactions  . Ciprofloxacin Anxiety and Rash  . Prednisone Shortness Of Breath    "jacks me up" jittery  . Codeine Nausea Only    Nausea   . Cortisone Swelling    "jack me up"    Current Outpatient Prescriptions  Medication Sig Dispense Refill  . amLODipine (NORVASC) 5 MG tablet       . clonazePAM (KLONOPIN) 0.5 MG tablet Take 0.5 mg by mouth 2 (two) times daily as needed for anxiety.      Marland Kitchen HYDROcodone-acetaminophen (HYCET) 7.5-325 mg/15 ml solution Take 10 mLs by mouth 4 (four) times daily as needed for moderate pain.  120 mL  0  . liothyronine (CYTOMEL) 5 MCG tablet       . sulfamethoxazole-trimethoprim (BACTRIM DS,SEPTRA DS) 800-160 MG per tablet Take 1 tablet by mouth 2 (two) times daily.  14 tablet  0  . SYNTHROID 137 MCG tablet       . triamterene-hydrochlorothiazide (MAXZIDE-25) 37.5-25 MG per tablet       . acetaminophen (TYLENOL) 500 MG tablet Take 500 mg by mouth every 6 (six) hours as needed for headache.      . Alum & Mag Hydroxide-Simeth (MAGIC MOUTHWASH W/LIDOCAINE) SOLN 5-10 ml QID prn  240 mL  3  . ondansetron (ZOFRAN ODT) 4 MG disintegrating tablet Take 1 tablet (4 mg total) by mouth every 8 (eight) hours as needed for nausea or vomiting.  20 tablet  0  . prochlorperazine (COMPAZINE) 10 MG tablet Take 10 mg by mouth every 8 (eight) hours as needed for nausea or vomiting.       No current facility-administered medications for this visit.    OBJECTIVE: Middle-aged white woman who appears fatigued  Filed Vitals:   06/17/14 1216  BP: 117/79  Pulse: 102  Temp: 98.4 F (36.9 C)  Resp: 18     Body mass index is 31.73 kg/(m^2).    ECOG FS:1 - Symptomatic but completely ambulatory  GENERAL: Patient is a well appearing female in no acute distress HEENT:  Sclerae anicteric.  Oropharynx clear and moist. No ulcerations or  evidence of oropharyngeal candidiasis. Neck is supple.  NODES:  No cervical, supraclavicular, or axillary lymphadenopathy palpated.  BREAST EXAM:  Deferred. LUNGS:  Clear to auscultation bilaterally.  No wheezes or rhonchi. HEART:  Regular rate and rhythm. No murmur appreciated. ABDOMEN:  Soft, RUQ tenderness.  Positive, normoactive bowel sounds. No organomegaly palpated. MSK:  No focal spinal tenderness to palpation. Full range of motion bilaterally in the upper extremities. EXTREMITIES:  No peripheral edema.   SKIN:  Clear with no obvious rashes or skin changes. No nail dyscrasia. NEURO:  Nonfocal. Well oriented.  Appropriate affect.    LAB RESULTS:  CMP     Component  Value Date/Time   NA 138 06/15/2014 1050   NA 140 03/13/2009 2325   K 3.7 06/15/2014 1050   K 3.7 03/13/2009 2325   CL 105 03/13/2009 2325   CO2 22 06/15/2014 1050   CO2 29 03/13/2009 2325   GLUCOSE 118 06/15/2014 1050   GLUCOSE 107* 03/13/2009 2325   GLUCOSE 88 09/22/2006 1056   BUN 12.5 06/15/2014 1050   BUN 12 03/13/2009 2325   CREATININE 0.8 06/15/2014 1050   CREATININE 0.76 03/13/2009 2325   CALCIUM 9.8 06/15/2014 1050   CALCIUM 9.4 03/13/2009 2325   PROT 7.2 06/15/2014 1050   PROT 7.1 03/13/2009 2325   ALBUMIN 3.9 06/15/2014 1050   ALBUMIN 4.1 03/13/2009 2325   AST 24 06/15/2014 1050   AST 136* 03/13/2009 2325   ALT 49 06/15/2014 1050   ALT 303* 03/13/2009 2325   ALKPHOS 75 06/15/2014 1050   ALKPHOS 131* 03/13/2009 2325   BILITOT 0.81 06/15/2014 1050   BILITOT 0.5 03/13/2009 2325   GFRNONAA >60 03/13/2009 2325   GFRAA  Value: >60        The eGFR has been calculated using the MDRD equation. This calculation has not been validated in all clinical situations. eGFR's persistently <60 mL/min signify possible Chronic Kidney Disease. 03/13/2009 2325    I No results found for this basename: SPEP,  UPEP,   kappa and lambda light chains    Lab Results  Component Value Date   WBC 6.5 06/15/2014   NEUTROABS 5.8 06/15/2014   HGB  10.9* 06/15/2014   HCT 33.0* 06/15/2014   MCV 96.2 06/15/2014   PLT 315 06/15/2014      Chemistry      Component Value Date/Time   NA 138 06/15/2014 1050   NA 140 03/13/2009 2325   K 3.7 06/15/2014 1050   K 3.7 03/13/2009 2325   CL 105 03/13/2009 2325   CO2 22 06/15/2014 1050   CO2 29 03/13/2009 2325   BUN 12.5 06/15/2014 1050   BUN 12 03/13/2009 2325   CREATININE 0.8 06/15/2014 1050   CREATININE 0.76 03/13/2009 2325      Component Value Date/Time   CALCIUM 9.8 06/15/2014 1050   CALCIUM 9.4 03/13/2009 2325   ALKPHOS 75 06/15/2014 1050   ALKPHOS 131* 03/13/2009 2325   AST 24 06/15/2014 1050   AST 136* 03/13/2009 2325   ALT 49 06/15/2014 1050   ALT 303* 03/13/2009 2325   BILITOT 0.81 06/15/2014 1050   BILITOT 0.5 03/13/2009 2325       No results found for this basename: LABCA2    No components found with this basename: [id number]    No results found for this basename: INR,  in the last 168 hours  Urinalysis    Component Value Date/Time   COLORURINE YELLOW 03/13/2009 2251   APPEARANCEUR CLOUDY* 03/13/2009 2251   LABSPEC 1.020 06/15/2014 1101   LABSPEC 1.018 03/13/2009 2251   PHURINE 5.5 03/13/2009 2251   GLUCOSEU Negative 06/15/2014 1101   GLUCOSEU NEGATIVE 03/13/2009 2251   HGBUR NEGATIVE 03/13/2009 2251   BILIRUBINUR NEGATIVE 03/13/2009 2251   KETONESUR NEGATIVE 03/13/2009 2251   PROTEINUR NEGATIVE 03/13/2009 2251   UROBILINOGEN 0.2 06/15/2014 1101   UROBILINOGEN 0.2 03/13/2009 2251   NITRITE NEGATIVE 03/13/2009 2251   LEUKOCYTESUR SMALL* 03/13/2009 2251    STUDIES: No results found.  ASSESSMENT: 53 y.o. [place] woman status post left breast biopsy 02/08/2014 for a clinical T1c N0, stage IA invasive ductal carcinoma, grade 3, triple negative, with an MIB-1  of 50%.  (1) status post left lumpectomy and sentinel lymph node sampling 03/01/2014 for a pT1c pN0, stage IA invasive ductal carcinoma, grade 3, with negative margins, repeat prognostic panel again triple negative.  (2) adjuvant  chemotherapy started a 03/25/2014 consisting of cyclophosphamide and doxorubicin given in dose dense fashion x4, completed 05/06/2014; followed by weekly paclitaxel x1, on 05/27/2014, poorly tolerated; switched to Abraxane with second dose, starting 06/10/2014, with 11 Abraxane doses planned  (3) adjuvant radiation to follow chemotherapy  (4) skin rash--resolved  (5) question of sleep apnea  PLAN: Ryelynn will not receive chemotherapy today.  I have ordered a STAT CT of the abdomen and pelvis and she will undergo this today.  She will also start taking Levaquin daily.  I gave her detailed information on this antibiotic in her AVS.  This should cover anything respiratory she may be coming down with in additional to extra abdominal coverage.  I did call the lab and a urine culture, was not sent on Wednesday, 9/16.  The patient will send one today.  According to the lab representative, the blood culture was collected and had not grown anything, though the results were not yet in the system.   She knows if the pain worsens to call us, and that if anything emergent is seen on the CT scan, she will receive a phone call.   Abeni will return on Monday for f/u. She understands and agrees with the plan. She has been encouraged to call with any issues that might arise before here next visit here.  I spent 40 minutes counseling the patient face to face.  The total time spent in the appointment was 50 minutes.   Minette Headland, [place] [phone]  06/17/2014 12:30 PM   Attending Note  I personally saw and examined Alexandria Bradley. The plan of care was discussed with her. I agree with the assessment and plan as documented above. Patient's abdominal pain needs to be evaluated with an urgent CT of the abdomen and pelvis. Because she has symptoms related to sinusitis and bronchitis, in addition to abdominal symptoms, we elected to change her antibiotic from  Bactrim to Levaquin. Chemotherapy will need to be held until we figure out the cause of her low-grade temperatures and her symptoms. She will return back on Monday for followup assessment. Signed Rulon Eisenmenger, MD

## 2014-06-17 NOTE — Patient Instructions (Signed)

## 2014-06-17 NOTE — Telephone Encounter (Signed)
per Shauna Hugh pt is not having tx today....cx to put some one else in that spot

## 2014-06-17 NOTE — Patient Instructions (Signed)
Levofloxacin tablets What is this medicine? LEVOFLOXACIN (lee voe FLOX a sin) is a quinolone antibiotic. It is used to treat certain kinds of bacterial infections. It will not work for colds, flu, or other viral infections. This medicine may be used for other purposes; ask your health care provider or pharmacist if you have questions. COMMON BRAND NAME(S): Levaquin, Levaquin Leva-Pak What should I tell my health care provider before I take this medicine? They need to know if you have any of these conditions: -cerebral disease -irregular heartbeat -kidney disease -seizure disorder -an unusual or allergic reaction to levofloxacin, other antibiotics or medicines, foods, dyes, or preservatives -pregnant or trying to get pregnant -breast-feeding How should I use this medicine? Take this medicine by mouth with a full glass of water. Follow the directions on the prescription label. This medicine can be taken with or without food. Take your medicine at regular intervals. Do not take your medicine more often than directed. Do not skip doses or stop your medicine early even if you feel better. Do not stop taking except on your doctor's advice. A special MedGuide will be given to you by the pharmacist with each prescription and refill. Be sure to read this information carefully each time. Talk to your pediatrician regarding the use of this medicine in children. While this drug may be prescribed for children as young as 6 months for selected conditions, precautions do apply. Overdosage: If you think you have taken too much of this medicine contact a poison control center or emergency room at once. NOTE: This medicine is only for you. Do not share this medicine with others. What if I miss a dose? If you miss a dose, take it as soon as you remember. If it is almost time for your next dose, take only that dose. Do not take double or extra doses. What may interact with this medicine? Do not take this medicine  with any of the following medications: -arsenic trioxide -chloroquine -droperidol -medicines for irregular heart rhythm like amiodarone, disopyramide, dofetilide, flecainide, quinidine, procainamide, sotalol -some medicines for depression or mental problems like phenothiazines, pimozide, and ziprasidone This medicine may also interact with the following medications: -amoxapine -antacids -birth control pills -cisapride -dairy products -didanosine (ddI) buffered tablets or powder -haloperidol -multivitamins -NSAIDS, medicines for pain and inflammation, like ibuprofen or naproxen -retinoid products like tretinoin or isotretinoin -risperidone -some other antibiotics like clarithromycin or erythromycin -sucralfate -theophylline -warfarin This list may not describe all possible interactions. Give your health care provider a list of all the medicines, herbs, non-prescription drugs, or dietary supplements you use. Also tell them if you smoke, drink alcohol, or use illegal drugs. Some items may interact with your medicine. What should I watch for while using this medicine? Tell your doctor or health care professional if your symptoms do not improve or if they get worse. Drink several glasses of water a day and cut down on drinks that contain caffeine. You must not get dehydrated while taking this medicine. You may get drowsy or dizzy. Do not drive, use machinery, or do anything that needs mental alertness until you know how this medicine affects you. Do not sit or stand up quickly, especially if you are an older patient. This reduces the risk of dizzy or fainting spells. This medicine can make you more sensitive to the sun. Keep out of the sun. If you cannot avoid being in the sun, wear protective clothing and use a sunscreen. Do not use sun lamps or tanning beds/booths.   Contact your doctor if you get a sunburn. If you are a diabetic monitor your blood glucose carefully. If you get an unusual  reading stop taking this medicine and call your doctor right away. Do not treat diarrhea with over-the-counter products. Contact your doctor if you have diarrhea that lasts more than 2 days or if the diarrhea is severe and watery. Avoid antacids, calcium, iron, and zinc products for 2 hours before and 2 hours after taking a dose of this medicine. What side effects may I notice from receiving this medicine? Side effects that you should report to your doctor or health care professional as soon as possible: -allergic reactions like skin rash or hives, swelling of the face, lips, or tongue -changes in vision -confusion, nightmares or hallucinations -difficulty breathing -irregular heartbeat, chest pain -joint, muscle or tendon pain -pain or difficulty passing urine -persistent headache with or without blurred vision -redness, blistering, peeling or loosening of the skin, including inside the mouth -seizures -unusual pain, numbness, tingling, or weakness -vaginal irritation, discharge Side effects that usually do not require medical attention (report to your doctor or health care professional if they continue or are bothersome): -diarrhea -dry mouth -headache -stomach upset, nausea -trouble sleeping This list may not describe all possible side effects. Call your doctor for medical advice about side effects. You may report side effects to FDA at 1-800-FDA-1088. Where should I keep my medicine? Keep out of the reach of children. Store at room temperature between 15 and 30 degrees C (59 and 86 degrees F). Keep in a tightly closed container. Throw away any unused medicine after the expiration date. NOTE: This sheet is a summary. It may not cover all possible information. If you have questions about this medicine, talk to your doctor, pharmacist, or health care provider.  2015, Elsevier/Gold Standard. (2013-04-23 07:45:07)  

## 2014-06-18 LAB — URINE CULTURE

## 2014-06-20 ENCOUNTER — Encounter: Payer: Self-pay | Admitting: Adult Health

## 2014-06-20 ENCOUNTER — Ambulatory Visit (HOSPITAL_BASED_OUTPATIENT_CLINIC_OR_DEPARTMENT_OTHER): Payer: 59 | Admitting: Adult Health

## 2014-06-20 VITALS — BP 118/71 | HR 85 | Temp 98.3°F | Resp 18 | Ht 65.0 in | Wt 192.2 lb

## 2014-06-20 DIAGNOSIS — C50212 Malignant neoplasm of upper-inner quadrant of left female breast: Secondary | ICD-10-CM

## 2014-06-20 DIAGNOSIS — R05 Cough: Secondary | ICD-10-CM

## 2014-06-20 DIAGNOSIS — C50219 Malignant neoplasm of upper-inner quadrant of unspecified female breast: Secondary | ICD-10-CM

## 2014-06-20 DIAGNOSIS — K6389 Other specified diseases of intestine: Secondary | ICD-10-CM

## 2014-06-20 DIAGNOSIS — R059 Cough, unspecified: Secondary | ICD-10-CM

## 2014-06-20 DIAGNOSIS — Z171 Estrogen receptor negative status [ER-]: Secondary | ICD-10-CM

## 2014-06-20 MED ORDER — HYDROCOD POLST-CHLORPHEN POLST 10-8 MG/5ML PO LQCR: 5.0000 mL | Freq: Every evening | ORAL | Status: DC | PRN

## 2014-06-20 NOTE — Progress Notes (Addendum)
[place]  [phone] [phone]) [phone]     ID: Alexandria Bradley OB: 1961/07/18  MR#: [id number]  KTG#:[id number]  PCP: Lovenia Kim, MD GYN:   SU: [name] OTHER MD: [name], Delrae Rend, Wilford Corner  CHIEF COMPLAINT: Triple negative breast cancer  CURRENT TREATMENT: Adjuvant chemotherapy  BREAST CANCER HISTORY: From the original intake note of 02/16/2014:  Alexandria Bradley palpated a mass in her left breast early May and brought it immediately to her gynecologist attention. He set her up for bilateral diagnostic mammography and left ultrasonography at [facility] 02/07/2014. Mammography showed a 1.3 cm oval mass with spiculated margins in the left breast at the 11:00 position. This was palpable. Ultrasound showed a 1.1 cm tall her than wide mass with a microlobulated margins. He was hypoechoic. There were no abnormalities noted in the left axilla.  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 Lyndon Code at conference 02/16/2014), triple negative, with an MIB-1 of 50%.  The patient's subsequent history is as detailed below  INTERVAL HISTORY: Alexandria Bradley returns today for follow up of her breast cancer. She is doing well today.  Her abdomen is feeling much better and has decreased to a dull ache.  Her fever has gone.  She continues taking Levaquin daily.  She does have a dull ache in her right upper quadrant of her abdomen, but it does not feel like it did before.  She is coughing more due to a mild cold, and wants to know what to take for the cough.  She denies any nausea, vomiting, constipation, diarrhea, fever, chills, shortness of breath, chest pain, sore throat, sinus or facial pain, or any further concerns.   REVIEW OF SYSTEMS: A 10 point review of systems was conducted and is otherwise negative except for what is noted above.       PAST MEDICAL HISTORY: Past Medical History  Diagnosis Date  . Thyroid disease   . Osteoporosis   . Psoriasis   . Breast cancer   . Arthritis   . Back pain   . Hypothyroidism   . Anxiety   . Breast cancer     left    PAST SURGICAL HISTORY: Past Surgical History  Procedure Laterality Date  . Cholecystectomy    . Tonsillectomy    . Tubal ligation    . Myomectomy    . Lipoma excision    . Portacath placement N/A 03/01/2014    Procedure: INSERTION PORT-A-CATH;  Surgeon: [name], MD;  Location: [facility];  Service: General;  Laterality: N/A;    FAMILY HISTORY Family History  Problem Relation Age of Onset  . Endometrial cancer Mother   . Lung cancer Father   . Brain cancer Father    the patient's father died at the age of 17 from what may have been metastatic lung cancer. The patient knows very little about the father's side of her family. Her mother is alive at age 56. She was recently diagnosed with endometrial cancer. The patient has one brother, 2 sisters. There is no history of breast or ovarian cancer in the family.  GYNECOLOGIC HISTORY:  Menarche age 58, first live birth age 62. The patient is GX P3. One child died shortly after birth. She stopped having periods approximately 2005. She did not use hormone replacement. 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 Her husband [name] is a Insurance underwriter. Son Alexandria Bradley is that she Nurse, adult in [place]. Daughter Alexandria Bradley lives in [place] and works for [employer] as an Therapist, sports.    ADVANCED DIRECTIVES: Not in place   HEALTH MAINTENANCE: History  Substance Use Topics  . Smoking status: Former Research scientist (life sciences)  . Smokeless tobacco: Not on file  . Alcohol Use: Yes     Comment: social     Colonoscopy: 2010  PAP: 2013  Bone density: 05/15/2009, at [place], normal, lowest T  score -0.8  Lipid panel:  Allergies  Allergen Reactions  . Ciprofloxacin Anxiety and Rash  . Prednisone Shortness Of Breath    "jacks me up" jittery  . Codeine Nausea Only    Nausea   . Cortisone Swelling    "jack me up"    Current Outpatient Prescriptions  Medication Sig Dispense Refill  . acetaminophen (TYLENOL) 500 MG tablet Take 500 mg by mouth every 6 (six) hours as needed for headache.      Marland Kitchen amLODipine (NORVASC) 5 MG tablet       . clonazePAM (KLONOPIN) 0.5 MG tablet Take 0.5 mg by mouth 2 (two) times daily as needed for anxiety.      Marland Kitchen levofloxacin (LEVAQUIN) 750 MG tablet Take 1 tablet (750 mg total) by mouth daily.  10 tablet  0  . liothyronine (CYTOMEL) 5 MCG tablet       . ondansetron (ZOFRAN ODT) 4 MG disintegrating tablet Take 1 tablet (4 mg total) by mouth every 8 (eight) hours as needed for nausea or vomiting.  20 tablet  0  . prochlorperazine (COMPAZINE) 10 MG tablet Take 10 mg by mouth every 8 (eight) hours as needed for nausea or vomiting.      Marland Kitchen SYNTHROID 137 MCG tablet       . triamterene-hydrochlorothiazide (MAXZIDE-25) 37.5-25 MG per tablet       . Alum & Mag Hydroxide-Simeth (MAGIC MOUTHWASH W/LIDOCAINE) SOLN 5-10 ml QID prn  240 mL  3  . chlorpheniramine-HYDROcodone (TUSSIONEX) 10-8 MG/5ML LQCR Take 5 mLs by mouth at bedtime as needed for cough.  115 mL  0  . HYDROcodone-acetaminophen (HYCET) 7.5-325 mg/15 ml solution Take 10 mLs by mouth 4 (four) times daily as needed for moderate pain.  120 mL  0   No current facility-administered medications for this visit.    OBJECTIVE: Middle-aged white woman who appears fatigued  Filed Vitals:   06/20/14 0830  BP: 118/71  Pulse: 85  Temp: 98.3 F (36.8 C)  Resp: 18     Body mass index is 31.98 kg/(m^2).    ECOG FS:1 - Symptomatic but completely ambulatory  GENERAL: Patient is a well appearing female in no acute distress HEENT:  Sclerae anicteric.  Oropharynx clear and moist. No ulcerations or evidence of  oropharyngeal candidiasis. Neck is supple. No sinus tenderness, discharge.   NODES:  No cervical, supraclavicular, or axillary lymphadenopathy palpated.  BREAST EXAM:  Deferred. LUNGS:  Clear to auscultation bilaterally.  No wheezes or rhonchi. HEART:  Regular rate and rhythm. No murmur appreciated. ABDOMEN:  Soft, RUQ tenderness.  Positive, normoactive bowel sounds. No organomegaly palpated. MSK:  No focal spinal tenderness to palpation. Full range of motion bilaterally in the upper extremities. EXTREMITIES:  No peripheral edema.   SKIN:  Clear with no obvious rashes or skin changes. No nail dyscrasia. NEURO:  Nonfocal. Well oriented.  Appropriate affect.  LAB RESULTS:  CMP     Component Value Date/Time   NA 138 06/15/2014 1050   NA 140 03/13/2009 2325   K 3.7 06/15/2014 1050   K 3.7 03/13/2009 2325   CL 105 03/13/2009 2325   CO2 22 06/15/2014 1050   CO2 29 03/13/2009 2325   GLUCOSE 118 06/15/2014 1050   GLUCOSE 107* 03/13/2009 2325   GLUCOSE 88 09/22/2006 1056   BUN 12.5 06/15/2014 1050   BUN 12 03/13/2009 2325   CREATININE 0.8 06/15/2014 1050   CREATININE 0.76 03/13/2009 2325   CALCIUM 9.8 06/15/2014 1050   CALCIUM 9.4 03/13/2009 2325   PROT 7.2 06/15/2014 1050   PROT 7.1 03/13/2009 2325   ALBUMIN 3.9 06/15/2014 1050   ALBUMIN 4.1 03/13/2009 2325   AST 24 06/15/2014 1050   AST 136* 03/13/2009 2325   ALT 49 06/15/2014 1050   ALT 303* 03/13/2009 2325   ALKPHOS 75 06/15/2014 1050   ALKPHOS 131* 03/13/2009 2325   BILITOT 0.81 06/15/2014 1050   BILITOT 0.5 03/13/2009 2325   GFRNONAA >60 03/13/2009 2325   GFRAA  Value: >60        The eGFR has been calculated using the MDRD equation. This calculation has not been validated in all clinical situations. eGFR's persistently <60 mL/min signify possible Chronic Kidney Disease. 03/13/2009 2325    I No results found for this basename: SPEP,  UPEP,   kappa and lambda light chains    Lab Results  Component Value Date   WBC 6.5 06/15/2014   NEUTROABS  5.8 06/15/2014   HGB 10.9* 06/15/2014   HCT 33.0* 06/15/2014   MCV 96.2 06/15/2014   PLT 315 06/15/2014      Chemistry      Component Value Date/Time   NA 138 06/15/2014 1050   NA 140 03/13/2009 2325   K 3.7 06/15/2014 1050   K 3.7 03/13/2009 2325   CL 105 03/13/2009 2325   CO2 22 06/15/2014 1050   CO2 29 03/13/2009 2325   BUN 12.5 06/15/2014 1050   BUN 12 03/13/2009 2325   CREATININE 0.8 06/15/2014 1050   CREATININE 0.76 03/13/2009 2325      Component Value Date/Time   CALCIUM 9.8 06/15/2014 1050   CALCIUM 9.4 03/13/2009 2325   ALKPHOS 75 06/15/2014 1050   ALKPHOS 131* 03/13/2009 2325   AST 24 06/15/2014 1050   AST 136* 03/13/2009 2325   ALT 49 06/15/2014 1050   ALT 303* 03/13/2009 2325   BILITOT 0.81 06/15/2014 1050   BILITOT 0.5 03/13/2009 2325       No results found for this basename: LABCA2    No components found with this basename: [id number]    No results found for this basename: INR,  in the last 168 hours  Urinalysis    Component Value Date/Time   COLORURINE YELLOW 03/13/2009 2251   APPEARANCEUR CLOUDY* 03/13/2009 2251   LABSPEC 1.020 06/15/2014 1101   LABSPEC 1.018 03/13/2009 2251   PHURINE 5.5 03/13/2009 2251   GLUCOSEU Negative 06/15/2014 1101   GLUCOSEU NEGATIVE 03/13/2009 2251   HGBUR NEGATIVE 03/13/2009 2251   BILIRUBINUR NEGATIVE 03/13/2009 2251   KETONESUR NEGATIVE 03/13/2009 2251   PROTEINUR NEGATIVE 03/13/2009 2251   UROBILINOGEN 0.2 06/15/2014 1101   UROBILINOGEN 0.2 03/13/2009 2251   NITRITE NEGATIVE 03/13/2009 2251   LEUKOCYTESUR SMALL* 03/13/2009 2251    STUDIES: Ct Abdomen Pelvis W Contrast  06/17/2014   CLINICAL DATA:  Two weeks of worsening right upper quadrant and right flank pain;  history of breast malignancy, currently undergoing chemotherapy  EXAM: CT ABDOMEN AND PELVIS WITH CONTRAST  TECHNIQUE: Multidetector CT imaging of the abdomen and pelvis was performed using the standard protocol following bolus administration of intravenous contrast.  CONTRAST:  133m  OMNIPAQUE IOHEXOL 300 MG/ML  SOLN  COMPARISON:  CT scan of the abdomen and pelvis dated March 14, 2009  FINDINGS: The liver exhibits decreased density diffusely. There is subtle hypodensity near the porta hepatis adjacent to the surgical clips which becomes isodense on delayed images compatible with hemangioma. There is no intrahepatic ductal dilation. The pancreas, spleen, adrenal glands, and kidneys exhibit no acute abnormalities. There are 2 stable accessory spleens measuring 1.5 cm in diameter. On delayed images contrast within the renal collecting systems is normal.  The stomach, duodenum, and remainder of the small bowel are normal. There is a 2 cm area of luminal narrowing and mural thickening of the mid transverse colon. This is demonstrated on images 47 through 50 of series 2. It is visible on coronal image #31 and sagittal image # 111. It is not producing significant obstruction. There are a few scattered diverticula in the sigmoid colon.  The urinary bladder, uterus, and adnexal structures are normal. There is no free pelvic fluid. There is no inguinal nor significant umbilical hernia.  The lumbar spine and bony pelvis are unremarkable. The lung bases are clear.  IMPRESSION: 1. There is new short segment mid transverse colon luminal narrowing and mural thickening. The possibility of a primary or metastatic malignancy is raised. Direct visualization is recommended. 2. There fatty infiltrative changes of the liver. There are no findings suspicious for metastatic disease to the liver. The gallbladder is surgically absent. 3. There is no acute urinary tract or gynecological abnormality. 4. These results will be called to the ordering clinician or representative by the Radiologist Assistant, and communication documented in the PACS or zVision Dashboard.   Electronically Signed   By: David  JMartinique  On: 06/17/2014 16:36    ASSESSMENT: 53y.o. [place] woman status post left breast biopsy 02/08/2014 for a  clinical T1c N0, stage IA invasive ductal carcinoma, grade 3, triple negative, with an MIB-1 of 50%.  (1) status post left lumpectomy and sentinel lymph node sampling 03/01/2014 for a pT1c pN0, stage IA invasive ductal carcinoma, grade 3, with negative margins, repeat prognostic panel again triple negative.  (2) adjuvant chemotherapy started a 03/25/2014 consisting of cyclophosphamide and doxorubicin given in dose dense fashion x4, completed 05/06/2014; followed by weekly paclitaxel x1, on 05/27/2014, poorly tolerated; switched to Abraxane with second dose, starting 06/10/2014, with 11 Abraxane doses planned  (3) adjuvant radiation to follow chemotherapy  (4) skin rash--resolved  (5) question of sleep apnea  (6) transverse colon lesion noted on CT scan from 06/17/2014  PLAN:  Alexandria Bradley feeling much improved.  She has started Levaquin and it appears to have tremendously helped with her infection.  Her urine is negative.  According to the lab representative on Friday, her blood culture was negative.  She continues to have a residual cough and I did instruct her to take OTC robitussin once a day if needed.  I prescribed Tussionex for her to take at night if needed.    I reviewed the CT abdomen/pelvis results with her.  There is no emergent abnormality, however, she does have thickening and narrowing in her transverse colon that was visualized and she does need to f/u with Dr. SMichail Sermonat E[place]lafor this.  Dr. MJana Hakim  did review that with the patient.  He reviewed with her that she does need a colonoscopy sooner rather than later and that it would be fine for this to happen while receiving chemotherapy--we will jsut need to monitor her labs very closely.  She will just need to f/u with him, and Dr. Jana Hakim plans on calling [name] to make that happen.    Alexandria Bradley will return on Friday for labs, evaluation, and to resume chemotherapy.   She understands and agrees with the plan. She has been  encouraged to call with any issues that might arise before here next visit here.  I spent 25 minutes counseling the patient face to face.  The total time spent in the appointment was 30 minutes.  Minette Headland, [place] [phone] 06/20/2014 10:11 AM    ADDENDUM: I discussed the CT scan findings with Alexandria Bradley. I also called [name]. He will be contacting her for colonoscopy ASAP.  Her counts have held up well despite to the Taxol treatment so I do not anticipate any leukopenia or thrombocytopenia that might compromise the procedure. Hopefully we will be able to continue the treatments until she completes her 12 doses planned. Her next dose will be due 06/24/2014.  I personally saw this patient and performed a substantive portion of this encounter with the listed APP documented above.   Chauncey Cruel, MD

## 2014-06-21 LAB — CULTURE, BLOOD (SINGLE)

## 2014-06-22 ENCOUNTER — Encounter: Payer: Self-pay | Admitting: Nurse Practitioner

## 2014-06-22 NOTE — Assessment & Plan Note (Signed)
Patient has history of chronic diarrhea.  She states that she has been tested in the past and was negative for C. difficile.  She prefers not to try the Imodium; stating that she is worried that she will be constipated.

## 2014-06-22 NOTE — Assessment & Plan Note (Signed)
Patient does appear to have some mild upper respiratory infection type symptoms at today.  She does appear to have some mild fullness the time of both TMs.  Oropharynx is nonerythmatous and has no pustules noted.  No cervical adenopathy noted on exam.  No nasal congestion noted.  No facial tenderness on exam.  Advised patient to try over-the-counter decongestants and/or saline nasal spray.  Patient was afebrile during exam today.  Pending blood culture results.

## 2014-06-22 NOTE — Progress Notes (Signed)
[name]   Chief Complaint  Patient presents with  . Fever    HPI: Alexandria Bradley 53 y.o. female diagnosed with breast cancer.  Currently undergoing Abraxane chemotherapy regimen.  Patient called the cancer Center today requesting urgent care visit.  She reports experiencing some upper respiratory infection-type symptoms which include nasal congestion, occasional bilateral ear pain, and a fever to the maximum of 101.6 this morning.  She denies any productive cough.  She denies any nausea or vomiting.  She suffers with chronic diarrhea; but states she prefers to hold taking any Imodium due to the worry of constipation.  She states she has been tested in the past for C. difficile; it was negative.   Fever     CURRENT THERAPY: Upcoming Treatment Dates - BREAST Abraxane D1, 8, 15 q28d Days with orders from any treatment category:  06/17/2014      SCHEDULING COMMUNICATION      ondansetron (ZOFRAN) IVPB 8 mg      dexamethasone (DECADRON) injection 10 mg      PACLitaxel-protein bound (ABRAXANE) chemo infusion 200 mg      sodium chloride 0.9 % injection 10 mL      heparin lock flush 100 unit/mL      heparin lock flush 100 unit/mL      alteplase (CATHFLO ACTIVASE) injection 2 mg      sodium chloride 0.9 % injection 3 mL      0.9 %  sodium chloride infusion      TREATMENT CONDITIONS 06/24/2014      SCHEDULING COMMUNICATION      ondansetron (ZOFRAN) IVPB 8 mg      dexamethasone (DECADRON) injection 10 mg      PACLitaxel-protein bound (ABRAXANE) chemo infusion 200 mg      sodium chloride 0.9 % injection 10 mL      heparin lock flush 100 unit/mL      heparin lock flush 100 unit/mL      alteplase (CATHFLO ACTIVASE) injection 2 mg      sodium chloride 0.9 % injection 3 mL      0.9 %  sodium chloride infusion      TREATMENT CONDITIONS 07/08/2014      SCHEDULING COMMUNICATION      ondansetron (ZOFRAN) IVPB 8 mg      dexamethasone (DECADRON) injection 10 mg  PACLitaxel-protein bound (ABRAXANE) chemo infusion 200 mg      sodium chloride 0.9 % injection 10 mL      heparin lock flush 100 unit/mL      heparin lock flush 100 unit/mL      alteplase (CATHFLO ACTIVASE) injection 2 mg      sodium chloride 0.9 % injection 3 mL      0.9 %  sodium chloride infusion      TREATMENT CONDITIONS    Review of Systems  Constitutional: Positive for fever.    Past Medical History  Diagnosis Date  . Thyroid disease   . Osteoporosis   . Psoriasis   . Breast cancer   . Arthritis   . Back pain   . Hypothyroidism   . Anxiety   . Breast cancer     left    Past Surgical History  Procedure Laterality Date  . Cholecystectomy    . Tonsillectomy    . Tubal ligation    . Myomectomy    . Lipoma excision    . Portacath placement N/A 03/01/2014    Procedure: INSERTION PORT-A-CATH;  Surgeon: [name], MD;  Location: [place];  Service: General;  Laterality: N/A;    has HYPOTHYROIDISM; HYPERLIPIDEMIA; CONJUNCTIVITIS, ALLERGIC; HYPERTENSION; SINUSITIS- ACUTE-NOS; URI; Allergic Rhinitis, Cause Unspecified; PSORIASIS; MALAISE AND FATIGUE; PALPITATIONS; OTHER CHEST PAIN; EPIGASTRIC PAIN; Nonspecific (abnormal) findings on radiological and other examination of body structure; CHEST XRAY, ABNORMAL; Breast cancer of upper-inner quadrant of left female breast; Drug induced neutropenia(288.03); Diarrhea; Chemotherapy-induced nausea; Abdominal pain, unspecified site; and Fever on her problem list.     is allergic to ciprofloxacin; prednisone; codeine; and cortisone.    Medication List       This list is accurate as of: 06/15/14 11:59 PM.  Always use your most recent med list.               acetaminophen 500 MG tablet  Commonly known as:  TYLENOL  Take 500 mg by mouth every 6 (six) hours as needed for headache.     amLODipine 5 MG tablet  Commonly known as:  NORVASC     clonazePAM 0.5 MG tablet  Commonly known as:  KLONOPIN  Take 0.5  mg by mouth 2 (two) times daily as needed for anxiety.     HYDROcodone-acetaminophen 7.5-325 mg/15 ml solution  Commonly known as:  HYCET  Take 10 mLs by mouth 4 (four) times daily as needed for moderate pain.     liothyronine 5 MCG tablet  Commonly known as:  CYTOMEL     magic mouthwash w/lidocaine Soln  5-10 ml QID prn     ondansetron 4 MG disintegrating tablet  Commonly known as:  ZOFRAN ODT  Take 1 tablet (4 mg total) by mouth every 8 (eight) hours as needed for nausea or vomiting.     prochlorperazine 10 MG tablet  Commonly known as:  COMPAZINE  Take 10 mg by mouth every 8 (eight) hours as needed for nausea or vomiting.     sulfamethoxazole-trimethoprim 800-160 MG per tablet  Commonly known as:  BACTRIM DS,SEPTRA DS  Take 1 tablet by mouth 2 (two) times daily.     SYNTHROID 137 MCG tablet  Generic drug:  levothyroxine     triamterene-hydrochlorothiazide 37.5-25 MG per tablet  Commonly known as:  MAXZIDE-25         PHYSICAL EXAMINATION  Blood pressure 123/77, pulse 109, temperature 98.9 F (37.2 C), temperature source Oral, resp. rate 17, height 5' 5"  (1.651 m), weight 188 lb 6.4 oz (85.458 kg), SpO2 96.00%.  Physical Exam  Nursing note and vitals reviewed. Constitutional: She is oriented to person, place, and time and well-developed, well-nourished, and in no distress. No distress.  HENT:  Head: Normocephalic and atraumatic.  Mouth/Throat: Oropharynx is clear and moist. No oropharyngeal exudate.  Bilateral fullness posterior to TMs.  Eyes: Conjunctivae and EOM are normal. Pupils are equal, round, and reactive to light. No scleral icterus.  Neck: Normal range of motion. Neck supple. No JVD present. No tracheal deviation present. No thyromegaly present.  Cardiovascular: Normal rate, regular rhythm, normal heart sounds and intact distal pulses.  Exam reveals no friction rub.   No murmur heard. Pulmonary/Chest: Effort normal and breath sounds normal. No respiratory  distress. She has no wheezes. She has no rales.  Abdominal: Soft. Bowel sounds are normal. She exhibits no distension. There is no tenderness. There is no rebound.  Musculoskeletal: Normal range of motion. She exhibits no edema and no tenderness.  Lymphadenopathy:    She has no cervical adenopathy.  Neurological: She is alert and oriented to person,  place, and time. Gait normal.  Skin: Skin is warm and dry. No rash noted. No erythema.  Psychiatric: Affect normal.    LABORATORY DATA:. CBC  Lab Results  Component Value Date   WBC 6.5 06/15/2014   RBC 3.43* 06/15/2014   HGB 10.9* 06/15/2014   HCT 33.0* 06/15/2014   PLT 315 06/15/2014   MCV 96.2 06/15/2014   MCH 31.8 06/15/2014   MCHC 33.1 06/15/2014   RDW 16.4* 06/15/2014   LYMPHSABS 0.3* 06/15/2014   MONOABS 0.1 06/15/2014   EOSABS 0.2 06/15/2014   BASOSABS 0.1 06/15/2014     CMET  Lab Results  Component Value Date   NA 138 06/15/2014   K 3.7 06/15/2014   CL 105 03/13/2009   CO2 22 06/15/2014   GLUCOSE 118 06/15/2014   BUN 12.5 06/15/2014   CREATININE 0.8 06/15/2014   CALCIUM 9.8 06/15/2014   PROT 7.2 06/15/2014   ALBUMIN 3.9 06/15/2014   AST 24 06/15/2014   ALT 49 06/15/2014   ALKPHOS 75 06/15/2014   BILITOT 0.81 06/15/2014   GFRNONAA >60 03/13/2009   GFRAA  Value: >60        The eGFR has been calculated using the MDRD equation. This calculation has not been validated in all clinical situations. eGFR's persistently <60 mL/min signify possible Chronic Kidney Disease. 03/13/2009   Negative mg/dL  Negative   Bilirubin (Urine) Negative  Color Interference   Ketones Negative mg/dL  Negative   Specific Gravity, Urine 1.003 - 1.035  1.020    1.018 R   1.033 (H) R  Blood Negative  Negative   pH 4.6 - 8.0  6.0    5.5 R   5.5 R  Protein Negative- <30 mg/dL  30    NEGATIVE R   NEGATIVE R  Urobilinogen, UR 0.2 - 1 mg/dL  0.2   Nitrite Negative  Negative    NEGATIVE R   NEGATIVE R  Leukocyte Esterase Negative  Trace   RBC  / HPF 0 - 2  Negative   WBC, UA 0 - 2  3-6    0-2 R  Bacteria, UA Negative- Trace  Moderate    RARE R  Casts Negative  Granular and hyaline   ASSESSMENT/PLAN:    URI  Assessment & Plan Patient does appear to have some mild upper respiratory infection type symptoms at today.  She does appear to have some mild fullness the time of both TMs.  Oropharynx is nonerythmatous and has no pustules noted.  No cervical adenopathy noted on exam.  No nasal congestion noted.  No facial tenderness on exam.  Advised patient to try over-the-counter decongestants and/or saline nasal spray.  Patient was afebrile during exam today.  Pending blood culture results.   Breast cancer of upper-inner quadrant of left female breast  Assessment & Plan Patient is scheduled to return this coming Friday, 06/17/2014 for her next cycle of  Chemotherapy.   Diarrhea  Assessment & Plan Patient has history of chronic diarrhea.  She states that she has been tested in the past and was negative for C. difficile.  She prefers not to try the Imodium; stating that she is worried that she will be constipated.   Fever  Assessment & Plan Patient reports that she did have it return this morning to a maximum 1 of 1.6.  Patient was afebrile with a temperature of 98.9 during the exam today.  Urinalysis was negative today; but still pending urine culture.  Blood cultures were pending.  Patient stated understanding of all instructions; and was in agreement with this plan of care. The patient knows to call the clinic with any problems, questions or concerns.   Review/collaboration with Dr. Jana Hakim  regarding all aspects of patient's visit today.   Total time spent with patient was  25  minutes;  with greater than  75  percent of that time spent in face to face counseling regarding  her symptoms, review of her lab results, and coordination of care and follow up.  Disclaimer: This note was dictated with voice recognition  software. Similar sounding words can inadvertently be transcribed and may not be corrected upon review.   Drue Second, NP 06/22/2014

## 2014-06-22 NOTE — Assessment & Plan Note (Signed)
Patient is scheduled to return this coming Friday, 06/17/2014 for her next cycle of  Chemotherapy.

## 2014-06-22 NOTE — Assessment & Plan Note (Signed)
Patient reports that she did have it return this morning to a maximum 1 of 1.6.  Patient was afebrile with a temperature of 98.9 during the exam today.  Urinalysis was negative today; but still pending urine culture.  Blood cultures were pending.

## 2014-06-24 ENCOUNTER — Encounter (HOSPITAL_COMMUNITY): Payer: Self-pay | Admitting: Pharmacy Technician

## 2014-06-24 ENCOUNTER — Other Ambulatory Visit (HOSPITAL_BASED_OUTPATIENT_CLINIC_OR_DEPARTMENT_OTHER): Payer: 59

## 2014-06-24 ENCOUNTER — Ambulatory Visit (HOSPITAL_BASED_OUTPATIENT_CLINIC_OR_DEPARTMENT_OTHER): Payer: 59 | Admitting: Oncology

## 2014-06-24 ENCOUNTER — Other Ambulatory Visit: Payer: Self-pay | Admitting: Gastroenterology

## 2014-06-24 ENCOUNTER — Encounter (HOSPITAL_COMMUNITY): Payer: Self-pay | Admitting: *Deleted

## 2014-06-24 ENCOUNTER — Ambulatory Visit: Payer: 59

## 2014-06-24 VITALS — BP 115/82 | HR 90 | Temp 98.2°F | Resp 18 | Ht 65.0 in | Wt 193.6 lb

## 2014-06-24 DIAGNOSIS — C50212 Malignant neoplasm of upper-inner quadrant of left female breast: Secondary | ICD-10-CM

## 2014-06-24 DIAGNOSIS — C50219 Malignant neoplasm of upper-inner quadrant of unspecified female breast: Secondary | ICD-10-CM

## 2014-06-24 DIAGNOSIS — Z171 Estrogen receptor negative status [ER-]: Secondary | ICD-10-CM

## 2014-06-24 DIAGNOSIS — K6389 Other specified diseases of intestine: Secondary | ICD-10-CM

## 2014-06-24 LAB — CBC WITH DIFFERENTIAL/PLATELET
BASO%: 0.9 % (ref 0.0–2.0)
Basophils Absolute: 0 10*3/uL (ref 0.0–0.1)
EOS%: 7.7 % — ABNORMAL HIGH (ref 0.0–7.0)
Eosinophils Absolute: 0.3 10*3/uL (ref 0.0–0.5)
HCT: 33.1 % — ABNORMAL LOW (ref 34.8–46.6)
HGB: 11 g/dL — ABNORMAL LOW (ref 11.6–15.9)
LYMPH%: 26.9 % (ref 14.0–49.7)
MCH: 31.7 pg (ref 25.1–34.0)
MCHC: 33.2 g/dL (ref 31.5–36.0)
MCV: 95.4 fL (ref 79.5–101.0)
MONO#: 0.6 10*3/uL (ref 0.1–0.9)
MONO%: 17.3 % — ABNORMAL HIGH (ref 0.0–14.0)
NEUT%: 47.2 % (ref 38.4–76.8)
NEUTROS ABS: 1.5 10*3/uL (ref 1.5–6.5)
NRBC: 0 % (ref 0–0)
Platelets: 365 10*3/uL (ref 145–400)
RBC: 3.47 10*6/uL — AB (ref 3.70–5.45)
RDW: 15.5 % — AB (ref 11.2–14.5)
WBC: 3.2 10*3/uL — ABNORMAL LOW (ref 3.9–10.3)
lymph#: 0.9 10*3/uL (ref 0.9–3.3)

## 2014-06-24 LAB — COMPREHENSIVE METABOLIC PANEL (CC13)
ALT: 34 U/L (ref 0–55)
AST: 25 U/L (ref 5–34)
Albumin: 3.6 g/dL (ref 3.5–5.0)
Alkaline Phosphatase: 68 U/L (ref 40–150)
Anion Gap: 13 mEq/L — ABNORMAL HIGH (ref 3–11)
BUN: 12.5 mg/dL (ref 7.0–26.0)
CALCIUM: 10 mg/dL (ref 8.4–10.4)
CHLORIDE: 104 meq/L (ref 98–109)
CO2: 23 mEq/L (ref 22–29)
Creatinine: 0.7 mg/dL (ref 0.6–1.1)
Glucose: 113 mg/dl (ref 70–140)
Potassium: 3.7 mEq/L (ref 3.5–5.1)
SODIUM: 140 meq/L (ref 136–145)
Total Bilirubin: 0.38 mg/dL (ref 0.20–1.20)
Total Protein: 6.6 g/dL (ref 6.4–8.3)

## 2014-06-24 LAB — TECHNOLOGIST REVIEW: Technologist Review: 1

## 2014-06-24 NOTE — Progress Notes (Signed)
[place]  [phone] [phone]     ID: Alexandria Bradley OB: 10-21-1960  MR#: [id number]  UVO#:[id number]  PCP: Alexandria Kim, MD GYN:   SU: Alexandria Bradley OTHER MD: Alexandria Bradley, Alexandria Bradley, Alexandria Bradley  CHIEF COMPLAINT: Triple negative breast cancer  CURRENT TREATMENT: Adjuvant chemotherapy  BREAST CANCER HISTORY: From the original intake note of 02/16/2014:  Alexandria Bradley palpated a mass in her left breast early May and brought it immediately to her gynecologist attention. He set her up for bilateral diagnostic mammography and left ultrasonography at [facility] 02/07/2014. Mammography showed a 1.3 cm oval mass with spiculated margins in the left breast at the 11:00 position. This was palpable. Ultrasound showed a 1.1 cm tall her than wide mass with a microlobulated margins. He was hypoechoic. There were no abnormalities noted in the left axilla.  On 02/08/2014 the patient underwent biopsy of the left breast mass, showing (SAA 15-07/11/2005) invasive ductal carcinoma, grade 2 (but described as grade 3 by Alexandria Bradley at conference 02/16/2014), triple negative, with an MIB-1 of 50%.  The patient's subsequent history is as detailed below  INTERVAL HISTORY: Alexandria Bradley returns today for follow up of her breast cancer accompanied by her husband Alexandria Bradley. She was due for her second Abraxane treatment today. However since the last time I saw her she had developed some abdominal pain. A CT of the abdomen shows a Veress suspicious lesion in the transverse colon. I contacted Alexandria Bradley and he has set her up for colonoscopy Monday. I think it would be prudent to hold her treatment right now to make sure her counts stay up for that procedure   REVIEW OF SYSTEMS: Alexandria Bradley tolerated the Abraxane fine. She has had no peripheral neuropathy problems. She did have some achiness and she continues to have a cough. This is occasionally productive of clear phlegm. Aside  from this a detailed review of systems was noncontributory    PAST MEDICAL HISTORY: Past Medical History  Diagnosis Date  . Thyroid disease   . Osteoporosis   . Psoriasis   . Breast cancer   . Arthritis   . Back pain   . Hypothyroidism   . Anxiety   . Breast cancer     left    PAST SURGICAL HISTORY: Past Surgical History  Procedure Laterality Date  . Cholecystectomy    . Tonsillectomy    . Tubal ligation    . Myomectomy    . Lipoma excision    . Portacath placement N/A 03/01/2014    Procedure: INSERTION PORT-A-CATH;  Surgeon: Alexandria Bookbinder, MD;  Location: [place];  Service: General;  Laterality: N/A;    FAMILY HISTORY Family History  Problem Relation Age of Onset  . Endometrial cancer Mother   . Lung cancer Father   . Brain cancer Father    the patient's father died at the age of 78 from what may have been metastatic lung cancer. The patient knows very little about the father's side of her family. Her mother is alive at age 53. She was recently diagnosed with endometrial cancer. The patient has one brother, 2 sisters. There is no history of breast or ovarian cancer in the family.  GYNECOLOGIC HISTORY:  Menarche age 9, first live birth age 45. The patient is GX P3. One child died shortly after birth. She stopped having periods approximately 2005. She did not use hormone replacement. 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 Her husband Alexandria Bradley is a Insurance underwriter. Son Alexandria Bradley is that she Nurse, adult in [place]. Daughter Alexandria Bradley lives in [place] and works for [employer] as an Therapist, sports.    ADVANCED DIRECTIVES: Not in place   HEALTH MAINTENANCE: History  Substance Use Topics  . Smoking status: Former Research scientist (life sciences)  . Smokeless tobacco: Not on file  . Alcohol Use: Yes     Comment: social     Colonoscopy: 2010  PAP:  2013  Bone density: 05/15/2009, at [facility], normal, lowest T score -0.8  Lipid panel:  Allergies  Allergen Reactions  . Ciprofloxacin Anxiety and Rash  . Prednisone Shortness Of Breath    "jacks me up" jittery  . Codeine Nausea Only    Nausea   . Cortisone Swelling    "jack me up"    Current Outpatient Prescriptions  Medication Sig Dispense Refill  . acetaminophen (TYLENOL) 500 MG tablet Take 500 mg by mouth every 6 (six) hours as needed for headache.      . Alum & Mag Hydroxide-Simeth (MAGIC MOUTHWASH W/LIDOCAINE) SOLN 5-10 ml QID prn  240 mL  3  . amLODipine (NORVASC) 5 MG tablet       . chlorpheniramine-HYDROcodone (TUSSIONEX) 10-8 MG/5ML LQCR Take 5 mLs by mouth at bedtime as needed for cough.  115 mL  0  . clonazePAM (KLONOPIN) 0.5 MG tablet Take 0.5 mg by mouth 2 (two) times daily as needed for anxiety.      Marland Kitchen HYDROcodone-acetaminophen (HYCET) 7.5-325 mg/15 ml solution Take 10 mLs by mouth 4 (four) times daily as needed for moderate pain.  120 mL  0  . levofloxacin (LEVAQUIN) 750 MG tablet Take 1 tablet (750 mg total) by mouth daily.  10 tablet  0  . liothyronine (CYTOMEL) 5 MCG tablet       . ondansetron (ZOFRAN ODT) 4 MG disintegrating tablet Take 1 tablet (4 mg total) by mouth every 8 (eight) hours as needed for nausea or vomiting.  20 tablet  0  . prochlorperazine (COMPAZINE) 10 MG tablet Take 10 mg by mouth every 8 (eight) hours as needed for nausea or vomiting.      Marland Kitchen SYNTHROID 137 MCG tablet       . triamterene-hydrochlorothiazide (MAXZIDE-25) 37.5-25 MG per tablet        No current facility-administered medications for this visit.    OBJECTIVE: Middle-aged white woman in no acute distress  Filed Vitals:   06/24/14 0924  BP: 115/82  Pulse: 90  Temp: 98.2 F (36.8 C)  Resp: 18     Body mass index is 32.22 kg/(m^2).    ECOG FS:1 - Symptomatic but completely ambulatory  Sclerae unicteric, pupils equal and reactive Oropharynx clear and slightly dry No  cervical or supraclavicular adenopathy Lungs no rales or rhonchi Heart regular rate and rhythm Abd soft, nontender, positive bowel sounds MSK no focal spinal tenderness, no upper extremity lymphedema Neuro: nonfocal, well oriented, appropriate affect Breasts: Deferred    LAB RESULTS:  CMP     Component Value Date/Time   NA 140 06/24/2014 0849   NA 140 03/13/2009 2325   K 3.7 06/24/2014 0849   K 3.7 03/13/2009 2325   CL 105 03/13/2009 2325   CO2 23 06/24/2014 0849   CO2 29 03/13/2009 2325   GLUCOSE 113 06/24/2014 0849   GLUCOSE 107* 03/13/2009 2325   GLUCOSE 88 09/22/2006 1056   BUN 12.5 06/24/2014 0849  BUN 12 03/13/2009 2325   CREATININE 0.7 06/24/2014 0849   CREATININE 0.76 03/13/2009 2325   CALCIUM 10.0 06/24/2014 0849   CALCIUM 9.4 03/13/2009 2325   PROT 6.6 06/24/2014 0849   PROT 7.1 03/13/2009 2325   ALBUMIN 3.6 06/24/2014 0849   ALBUMIN 4.1 03/13/2009 2325   AST 25 06/24/2014 0849   AST 136* 03/13/2009 2325   ALT 34 06/24/2014 0849   ALT 303* 03/13/2009 2325   ALKPHOS 68 06/24/2014 0849   ALKPHOS 131* 03/13/2009 2325   BILITOT 0.38 06/24/2014 0849   BILITOT 0.5 03/13/2009 2325   GFRNONAA >60 03/13/2009 2325   GFRAA  Value: >60        The eGFR has been calculated using the MDRD equation. This calculation has not been validated in all clinical situations. eGFR's persistently <60 mL/min signify possible Chronic Kidney Disease. 03/13/2009 2325    I No results found for this basename: SPEP,  UPEP,   kappa and lambda light chains    Lab Results  Component Value Date   WBC 3.2* 06/24/2014   NEUTROABS 1.5 06/24/2014   HGB 11.0* 06/24/2014   HCT 33.1* 06/24/2014   MCV 95.4 06/24/2014   PLT 365 06/24/2014      Chemistry      Component Value Date/Time   NA 140 06/24/2014 0849   NA 140 03/13/2009 2325   K 3.7 06/24/2014 0849   K 3.7 03/13/2009 2325   CL 105 03/13/2009 2325   CO2 23 06/24/2014 0849   CO2 29 03/13/2009 2325   BUN 12.5 06/24/2014 0849   BUN 12 03/13/2009 2325   CREATININE 0.7  06/24/2014 0849   CREATININE 0.76 03/13/2009 2325      Component Value Date/Time   CALCIUM 10.0 06/24/2014 0849   CALCIUM 9.4 03/13/2009 2325   ALKPHOS 68 06/24/2014 0849   ALKPHOS 131* 03/13/2009 2325   AST 25 06/24/2014 0849   AST 136* 03/13/2009 2325   ALT 34 06/24/2014 0849   ALT 303* 03/13/2009 2325   BILITOT 0.38 06/24/2014 0849   BILITOT 0.5 03/13/2009 2325       No results found for this basename: LABCA2    No components found with this basename: [id number]    No results found for this basename: INR,  in the last 168 hours  Urinalysis    Component Value Date/Time   COLORURINE YELLOW 03/13/2009 2251   APPEARANCEUR CLOUDY* 03/13/2009 2251   LABSPEC 1.020 06/15/2014 1101   LABSPEC 1.018 03/13/2009 2251   PHURINE 5.5 03/13/2009 2251   GLUCOSEU Negative 06/15/2014 1101   GLUCOSEU NEGATIVE 03/13/2009 2251   HGBUR NEGATIVE 03/13/2009 2251   BILIRUBINUR NEGATIVE 03/13/2009 2251   KETONESUR NEGATIVE 03/13/2009 2251   PROTEINUR NEGATIVE 03/13/2009 2251   UROBILINOGEN 0.2 06/15/2014 1101   UROBILINOGEN 0.2 03/13/2009 2251   NITRITE NEGATIVE 03/13/2009 2251   LEUKOCYTESUR SMALL* 03/13/2009 2251    STUDIES: Ct Abdomen Pelvis W Contrast  06/17/2014   CLINICAL DATA:  Two weeks of worsening right upper quadrant and right flank pain; history of breast malignancy, currently undergoing chemotherapy  EXAM: CT ABDOMEN AND PELVIS WITH CONTRAST  TECHNIQUE: Multidetector CT imaging of the abdomen and pelvis was performed using the standard protocol following bolus administration of intravenous contrast.  CONTRAST:  175mL OMNIPAQUE IOHEXOL 300 MG/ML  SOLN  COMPARISON:  CT scan of the abdomen and pelvis dated March 14, 2009  FINDINGS: The liver exhibits decreased density diffusely. There is subtle hypodensity near the porta hepatis adjacent to  the surgical clips which becomes isodense on delayed images compatible with hemangioma. There is no intrahepatic ductal dilation. The pancreas, spleen, adrenal glands, and  kidneys exhibit no acute abnormalities. There are 2 stable accessory spleens measuring 1.5 cm in diameter. On delayed images contrast within the renal collecting systems is normal.  The stomach, duodenum, and remainder of the small bowel are normal. There is a 2 cm area of luminal narrowing and mural thickening of the mid transverse colon. This is demonstrated on images 47 through 50 of series 2. It is visible on coronal image #31 and sagittal image # 111. It is not producing significant obstruction. There are a few scattered diverticula in the sigmoid colon.  The urinary bladder, uterus, and adnexal structures are normal. There is no free pelvic fluid. There is no inguinal nor significant umbilical hernia.  The lumbar spine and bony pelvis are unremarkable. The lung bases are clear.  IMPRESSION: 1. There is new short segment mid transverse colon luminal narrowing and mural thickening. The possibility of a primary or metastatic malignancy is raised. Direct visualization is recommended. 2. There fatty infiltrative changes of the liver. There are no findings suspicious for metastatic disease to the liver. The gallbladder is surgically absent. 3. There is no acute urinary tract or gynecological abnormality. 4. These results will be called to the ordering clinician or representative by the Radiologist Assistant, and communication documented in the PACS or zVision Dashboard.   Electronically Signed   By: David  Martinique   On: 06/17/2014 16:36    ASSESSMENT: 53 y.o. [place] woman status post left breast biopsy 02/08/2014 for a clinical T1c N0, stage IA invasive ductal carcinoma, grade 3, triple negative, with an MIB-1 of 50%.  (1) status post left lumpectomy and sentinel lymph node sampling 03/01/2014 for a pT1c pN0, stage IA invasive ductal carcinoma, grade 3, with negative margins, repeat prognostic panel again triple negative.  (2) adjuvant chemotherapy started a 03/25/2014 consisting of cyclophosphamide and  doxorubicin given in dose dense fashion x4, completed 05/06/2014; followed by weekly paclitaxel x1, on 05/27/2014, poorly tolerated; switched to Abraxane with second dose, starting 06/10/2014, with 11 Abraxane doses planned  (3) adjuvant radiation to follow chemotherapy  (4) skin rash--resolved  (5) question of sleep apnea  (6) transverse colon lesion noted on CT scan from 06/17/2014  PLAN:  Anaya's neutrophils have dropped to 1.5. Certainly I could treat her today, but she is having a colonoscopy with possible polypectomy in 3 days and I would not want her to be neutropenic at that time.  Accordingly we are holding off on her treatment today. She will call for the colonoscopy results mid next week. If those are positive I am going to refer her to Dr. Benay Spice who is our goal cancer specialist. We will still try to treat her a couple of more times weekly Abraxane, namely October 2 of October 9, but after that we would likely stop since she would probably be heading for colon resection.  I am also dropping her dose from 100 mg per meter squared to 80 mg per meter squared to prevent future treatment delays.  She has a good understanding of the overall plan. She agrees with it. She knows the goal of treatment in her case is cure. She will call with any problems that may develop before her next visit here. 06/24/2014 10:00 AM    Chauncey Cruel, MD

## 2014-06-24 NOTE — Progress Notes (Signed)
06/24/14 1307  OBSTRUCTIVE SLEEP APNEA  Have you ever been diagnosed with sleep apnea through a sleep study? No  Do you snore loudly (loud enough to be heard through closed doors)?  1  Do you often feel tired, fatigued, or sleepy during the daytime? 1  Has anyone observed you stop breathing during your sleep? 1  Do you have, or are you being treated for high blood pressure? 1  BMI more than 35 kg/m2? 0  Age over 55 years old? 1  Gender: 0  Obstructive Sleep Apnea Score 5

## 2014-06-24 NOTE — Addendum Note (Signed)
Addended by: Wilford Corner on: 06/24/2014 05:50 PM   Modules accepted: Orders

## 2014-06-27 ENCOUNTER — Encounter (HOSPITAL_COMMUNITY): Payer: Self-pay | Admitting: Gastroenterology

## 2014-06-27 ENCOUNTER — Ambulatory Visit (HOSPITAL_COMMUNITY): Payer: 59 | Admitting: Anesthesiology

## 2014-06-27 ENCOUNTER — Encounter (HOSPITAL_COMMUNITY)
Admission: RE | Disposition: A | Discharge status: Home / Self Care | Payer: Self-pay | Source: Ambulatory Visit | Attending: Gastroenterology

## 2014-06-27 ENCOUNTER — Encounter (HOSPITAL_COMMUNITY): Payer: 59 | Admitting: Anesthesiology

## 2014-06-27 ENCOUNTER — Ambulatory Visit (HOSPITAL_COMMUNITY)
Admission: RE | Admit: 2014-06-27 | Discharge: 2014-06-27 | Disposition: A | Discharge status: Home / Self Care | Payer: 59 | Source: Ambulatory Visit | Attending: Gastroenterology | Admitting: Gastroenterology

## 2014-06-27 DIAGNOSIS — F411 Generalized anxiety disorder: Secondary | ICD-10-CM | POA: Diagnosis not present

## 2014-06-27 DIAGNOSIS — Z87891 Personal history of nicotine dependence: Secondary | ICD-10-CM | POA: Insufficient documentation

## 2014-06-27 DIAGNOSIS — R933 Abnormal findings on diagnostic imaging of other parts of digestive tract: Secondary | ICD-10-CM | POA: Diagnosis present

## 2014-06-27 DIAGNOSIS — R1011 Right upper quadrant pain: Secondary | ICD-10-CM | POA: Diagnosis present

## 2014-06-27 DIAGNOSIS — I10 Essential (primary) hypertension: Secondary | ICD-10-CM | POA: Diagnosis not present

## 2014-06-27 DIAGNOSIS — K5730 Diverticulosis of large intestine without perforation or abscess without bleeding: Secondary | ICD-10-CM | POA: Diagnosis not present

## 2014-06-27 DIAGNOSIS — K648 Other hemorrhoids: Secondary | ICD-10-CM | POA: Diagnosis not present

## 2014-06-27 HISTORY — DX: Other specified postprocedural states: R11.2

## 2014-06-27 HISTORY — DX: Essential (primary) hypertension: I10

## 2014-06-27 HISTORY — PX: COLONOSCOPY: SHX5424

## 2014-06-27 HISTORY — DX: Pure hypercholesterolemia, unspecified: E78.00

## 2014-06-27 HISTORY — DX: Headache: R51

## 2014-06-27 HISTORY — DX: Ventricular premature depolarization: I49.3

## 2014-06-27 HISTORY — DX: Calculus of kidney: N20.0

## 2014-06-27 HISTORY — DX: Other specified postprocedural states: Z98.890

## 2014-06-27 SURGERY — COLONOSCOPY
Anesthesia: Monitor Anesthesia Care

## 2014-06-27 MED ORDER — LACTATED RINGERS IV SOLN
INTRAVENOUS | Status: DC
  Administered 2014-06-27: 100 mL via INTRAVENOUS

## 2014-06-27 MED ORDER — PROPOFOL 10 MG/ML IV BOLUS
INTRAVENOUS | Status: DC | PRN
  Administered 2014-06-27 (×2): 20 mg via INTRAVENOUS
  Administered 2014-06-27: 30 mg via INTRAVENOUS
  Administered 2014-06-27 (×2): 20 mg via INTRAVENOUS
  Administered 2014-06-27: 40 mg via INTRAVENOUS
  Administered 2014-06-27: 30 mg via INTRAVENOUS
  Administered 2014-06-27 (×3): 20 mg via INTRAVENOUS
  Administered 2014-06-27: 30 mg via INTRAVENOUS

## 2014-06-27 NOTE — H&P (Signed)
  Date of Initial H&P: 06/23/14  History reviewed, patient examined, no change in status, stable for surgery.

## 2014-06-27 NOTE — Transfer of Care (Signed)
Immediate Anesthesia Transfer of Care Note  Patient: Alexandria Bradley  Procedure(s) Performed: Procedure(s): COLONOSCOPY (N/A)  Patient Location: Endoscopy Unit  Anesthesia Type:MAC  Level of Consciousness: awake  Airway & Oxygen Therapy: Patient Spontanous Breathing and Patient connected to nasal cannula oxygen  Post-op Assessment: Report given to PACU RN, Post -op Vital signs reviewed and stable and Patient moving all extremities  Post vital signs: Reviewed and stable  Complications: No apparent anesthesia complications

## 2014-06-27 NOTE — Anesthesia Postprocedure Evaluation (Signed)
  Anesthesia Post-op Note  Patient: Alexandria Bradley  Procedure(s) Performed: Procedure(s): COLONOSCOPY (N/A)  Patient Location: PACU  Anesthesia Type: MAC  Level of Consciousness: awake and alert   Airway and Oxygen Therapy: Patient Spontanous Breathing  Post-op Pain: none  Post-op Assessment: Post-op Vital signs reviewed, Patient's Cardiovascular Status Stable and Respiratory Function Stable  Post-op Vital Signs: Reviewed  Filed Vitals:   06/27/14 1240  BP: 106/56  Pulse: 70  Temp:   Resp: 15    Complications: No apparent anesthesia complications

## 2014-06-27 NOTE — Anesthesia Preprocedure Evaluation (Addendum)
Anesthesia Evaluation  Patient identified by MRN, date of birth, ID band Patient awake    Reviewed: Allergy & Precautions, H&P , NPO status , Patient's Chart, lab work & pertinent test results  History of Anesthesia Complications (+) PONV  Airway Mallampati: II TM Distance: >3 FB Neck ROM: Full    Dental no notable dental hx. (+) Teeth Intact, Dental Advisory Given   Pulmonary neg pulmonary ROS, former smoker,  breath sounds clear to auscultation  Pulmonary exam normal       Cardiovascular hypertension, Pt. on medications negative cardio ROS  Rhythm:Regular Rate:Normal     Neuro/Psych  Headaches, Anxiety negative neurological ROS  negative psych ROS   GI/Hepatic negative GI ROS, Neg liver ROS,   Endo/Other  negative endocrine ROSHypothyroidism   Renal/GU negative Renal ROS  negative genitourinary   Musculoskeletal   Abdominal   Peds  Hematology negative hematology ROS (+)   Anesthesia Other Findings   Reproductive/Obstetrics negative OB ROS                          Anesthesia Physical Anesthesia Plan  ASA: II  Anesthesia Plan: MAC   Post-op Pain Management:    Induction: Intravenous  Airway Management Planned: Simple Face Mask and Nasal Cannula  Additional Equipment:   Intra-op Plan:   Post-operative Plan:   Informed Consent: I have reviewed the patients History and Physical, chart, labs and discussed the procedure including the risks, benefits and alternatives for the proposed anesthesia with the patient or authorized representative who has indicated his/her understanding and acceptance.   Dental advisory given  Plan Discussed with: CRNA  Anesthesia Plan Comments:         Anesthesia Quick Evaluation

## 2014-06-27 NOTE — Discharge Instructions (Addendum)
Will call you when path results are complete.

## 2014-06-27 NOTE — Op Note (Addendum)
[facility] [place], [address]   COLONOSCOPY PROCEDURE REPORT     EXAM DATE: 06/27/2014  PATIENT NAME:      Alexandria Bradley, Alexandria Bradley           MR #: [id number] BIRTHDATE:       02-06-1961      VISIT #:     [id number]  ATTENDING:     Wilford Corner, MD     STATUS:     outpatient REFERRING MD: ASA CLASS:        Class III  INDICATIONS:  The patient is a 53 yr old female here for a colonoscopy due to abdominal pain in the upper right quadrant and an abnormal CT.  Patient also has a history of adenomatous polyps.  PROCEDURE PERFORMED:     Colonoscopy with biopsy MEDICATIONS:     Monitored anesthesia care and Per Anesthesia  ESTIMATED BLOOD LOSS:     None  CONSENT: The patient understands the risks and benefits of the procedure and understands that these risks include, but are not limited to: sedation, allergic reaction, infection, perforation and/or bleeding. Alternative means of evaluation and treatment include, among others: physical exam, x-rays, and/or surgical intervention. The patient elects to proceed with this endoscopic procedure.  DESCRIPTION OF PROCEDURE: During intra-op preparation period all mechanical & medical equipment was checked for proper function. Hand hygiene and appropriate measures for infection prevention was taken. After the risks, benefits and alternatives of the procedure were thoroughly explained, Informed consent was verified, confirmed and timeout was successfully executed by the treatment team. A digital exam revealed no abnormalities of the rectum.      The Pentax Ped Colon [id number] endoscope was introduced through the anus and advanced to the cecum, which was identified by both the appendix and ileocecal valve. The prep was good.. The instrument was then slowly withdrawn as the colon was fully examined. The terminal ileum was intubated and was normal in appearance. On careful withdrawal of the  colonoscope no mass seen. Minimal amount of wall thickening at the splenic flexure and proximal descending colon that was biopsied. Mucosal slightly nodular in this area otherwise normal appearing mucosa. Scattered sigmoid diverticulosis noted.      Retroflexion revealed small internal hemorrhoids. The scope was then completely withdrawn from the patient and the procedure terminated.     ADVERSE EVENTS:      There were no immediate complications.   IMPRESSIONS:     1. Slightly nodular mucosa in proximal descending colon - s/p biopsies 2. NO mass or malignant-appearing process seen during colonoscopy 3. Sigmoid diverticulosis 4. Internal hemorrhoids  RECOMMENDATIONS:        F/U on path; No aspirin products for 3 days    Wilford Corner, MD eSigned:  Wilford Corner, MD 06/27/2014 1:28 PM Revised: 06/27/2014 1:28 PM  cc:     PATIENT NAME:  Laporche, Martelle MR#: [id number]

## 2014-06-27 NOTE — Interval H&P Note (Signed)
History and Physical Interval Note:  06/27/2014 10:23 AM  Alexandria Bradley  has presented today for surgery, with the diagnosis of abnormal scan  The various methods of treatment have been discussed with the patient and family. After consideration of risks, benefits and other options for treatment, the patient has consented to  Procedure(s): COLONOSCOPY (N/A) as a surgical intervention .  The patient's history has been reviewed, patient examined, no change in status, stable for surgery.  I have reviewed the patient's chart and labs.  Questions were answered to the patient's satisfaction.     [place] C.

## 2014-06-28 ENCOUNTER — Encounter (HOSPITAL_COMMUNITY): Payer: Self-pay | Admitting: Gastroenterology

## 2014-06-29 ENCOUNTER — Other Ambulatory Visit: Payer: Self-pay | Admitting: Oncology

## 2014-06-29 ENCOUNTER — Telehealth: Payer: Self-pay | Admitting: Oncology

## 2014-06-29 NOTE — Progress Notes (Unsigned)
Alexandria Bradley's colonoscopy was negative. We are resuming Abraxane October 2 and weekly as tolerated. I am dropping the dose to avoid further treatment delays. If there are future cytopenias we will add Neupogen daily x3 to her treatment. If there is significant neuropathy however we will simply stop the Abraxane.

## 2014-06-29 NOTE — Telephone Encounter (Signed)
s.w pt husband and advised on oct appts...ok and aware

## 2014-07-01 ENCOUNTER — Encounter: Payer: Self-pay | Admitting: Nurse Practitioner

## 2014-07-01 ENCOUNTER — Ambulatory Visit (HOSPITAL_BASED_OUTPATIENT_CLINIC_OR_DEPARTMENT_OTHER): Payer: 59 | Admitting: Nurse Practitioner

## 2014-07-01 ENCOUNTER — Other Ambulatory Visit (HOSPITAL_BASED_OUTPATIENT_CLINIC_OR_DEPARTMENT_OTHER): Payer: 59

## 2014-07-01 ENCOUNTER — Ambulatory Visit (HOSPITAL_BASED_OUTPATIENT_CLINIC_OR_DEPARTMENT_OTHER): Payer: 59

## 2014-07-01 VITALS — BP 134/82 | HR 87 | Temp 98.3°F | Resp 18 | Ht 65.0 in | Wt 191.5 lb

## 2014-07-01 DIAGNOSIS — R109 Unspecified abdominal pain: Secondary | ICD-10-CM

## 2014-07-01 DIAGNOSIS — C50212 Malignant neoplasm of upper-inner quadrant of left female breast: Secondary | ICD-10-CM

## 2014-07-01 DIAGNOSIS — J069 Acute upper respiratory infection, unspecified: Secondary | ICD-10-CM

## 2014-07-01 DIAGNOSIS — R1011 Right upper quadrant pain: Secondary | ICD-10-CM

## 2014-07-01 DIAGNOSIS — K639 Disease of intestine, unspecified: Secondary | ICD-10-CM

## 2014-07-01 DIAGNOSIS — Z171 Estrogen receptor negative status [ER-]: Secondary | ICD-10-CM

## 2014-07-01 DIAGNOSIS — R05 Cough: Secondary | ICD-10-CM

## 2014-07-01 DIAGNOSIS — Z5111 Encounter for antineoplastic chemotherapy: Secondary | ICD-10-CM

## 2014-07-01 LAB — COMPREHENSIVE METABOLIC PANEL (CC13)
ALT: 48 U/L (ref 0–55)
AST: 31 U/L (ref 5–34)
Albumin: 3.9 g/dL (ref 3.5–5.0)
Alkaline Phosphatase: 77 U/L (ref 40–150)
Anion Gap: 11 mEq/L (ref 3–11)
BILIRUBIN TOTAL: 0.59 mg/dL (ref 0.20–1.20)
BUN: 10.7 mg/dL (ref 7.0–26.0)
CO2: 22 mEq/L (ref 22–29)
Calcium: 10 mg/dL (ref 8.4–10.4)
Chloride: 106 mEq/L (ref 98–109)
Creatinine: 0.7 mg/dL (ref 0.6–1.1)
Glucose: 110 mg/dl (ref 70–140)
Potassium: 3.5 mEq/L (ref 3.5–5.1)
Sodium: 140 mEq/L (ref 136–145)
Total Protein: 6.9 g/dL (ref 6.4–8.3)

## 2014-07-01 LAB — CBC WITH DIFFERENTIAL/PLATELET
BASO%: 0.8 % (ref 0.0–2.0)
BASOS ABS: 0 10*3/uL (ref 0.0–0.1)
EOS ABS: 0.1 10*3/uL (ref 0.0–0.5)
EOS%: 2.6 % (ref 0.0–7.0)
HCT: 35.7 % (ref 34.8–46.6)
HEMOGLOBIN: 11.8 g/dL (ref 11.6–15.9)
LYMPH%: 20.3 % (ref 14.0–49.7)
MCH: 31.6 pg (ref 25.1–34.0)
MCHC: 33 g/dL (ref 31.5–36.0)
MCV: 95.7 fL (ref 79.5–101.0)
MONO#: 0.8 10*3/uL (ref 0.1–0.9)
MONO%: 16 % — AB (ref 0.0–14.0)
NEUT#: 3.1 10*3/uL (ref 1.5–6.5)
NEUT%: 60.3 % (ref 38.4–76.8)
PLATELETS: 328 10*3/uL (ref 145–400)
RBC: 3.73 10*6/uL (ref 3.70–5.45)
RDW: 15.7 % — ABNORMAL HIGH (ref 11.2–14.5)
WBC: 5.1 10*3/uL (ref 3.9–10.3)
lymph#: 1 10*3/uL (ref 0.9–3.3)

## 2014-07-01 MED ORDER — DEXAMETHASONE SODIUM PHOSPHATE 10 MG/ML IJ SOLN
10.0000 mg | Freq: Once | INTRAMUSCULAR | Status: AC
  Administered 2014-07-01: 10 mg via INTRAVENOUS

## 2014-07-01 MED ORDER — SODIUM CHLORIDE 0.9 % IJ SOLN
10.0000 mL | INTRAMUSCULAR | Status: DC | PRN
  Administered 2014-07-01: 10 mL
  Filled 2014-07-01: qty 10

## 2014-07-01 MED ORDER — HEPARIN SOD (PORK) LOCK FLUSH 100 UNIT/ML IV SOLN
500.0000 [IU] | Freq: Once | INTRAVENOUS | Status: AC | PRN
  Administered 2014-07-01: 500 [IU]
  Filled 2014-07-01: qty 5

## 2014-07-01 MED ORDER — SODIUM CHLORIDE 0.9 % IV SOLN
Freq: Once | INTRAVENOUS | Status: AC
  Administered 2014-07-01: 10:00:00 via INTRAVENOUS

## 2014-07-01 MED ORDER — DEXAMETHASONE SODIUM PHOSPHATE 10 MG/ML IJ SOLN
INTRAMUSCULAR | Status: AC
  Filled 2014-07-01: qty 1

## 2014-07-01 MED ORDER — ONDANSETRON 8 MG/50ML IVPB (CHCC)
8.0000 mg | Freq: Once | INTRAVENOUS | Status: AC
  Administered 2014-07-01: 8 mg via INTRAVENOUS

## 2014-07-01 MED ORDER — PACLITAXEL PROTEIN-BOUND CHEMO INJECTION 100 MG
80.0000 mg/m2 | Freq: Once | INTRAVENOUS | Status: AC
  Administered 2014-07-01: 150 mg via INTRAVENOUS
  Filled 2014-07-01: qty 30

## 2014-07-01 MED ORDER — ONDANSETRON 8 MG/NS 50 ML IVPB
INTRAVENOUS | Status: AC
  Filled 2014-07-01: qty 8

## 2014-07-01 NOTE — Patient Instructions (Signed)
[facility] Discharge Instructions for Patients Receiving Chemotherapy  Today you received the following chemotherapy agents: Abraxane.  To help prevent nausea and vomiting after your treatment, we encourage you to take your nausea medication as directed  If you develop nausea and vomiting that is not controlled by your nausea medication, call the clinic.   BELOW ARE SYMPTOMS THAT SHOULD BE REPORTED IMMEDIATELY:  *FEVER GREATER THAN 100.5 F  *CHILLS WITH OR WITHOUT FEVER  NAUSEA AND VOMITING THAT IS NOT CONTROLLED WITH YOUR NAUSEA MEDICATION  *UNUSUAL SHORTNESS OF BREATH  *UNUSUAL BRUISING OR BLEEDING  TENDERNESS IN MOUTH AND THROAT WITH OR WITHOUT PRESENCE OF ULCERS  *URINARY PROBLEMS  *BOWEL PROBLEMS  UNUSUAL RASH Items with * indicate a potential emergency and should be followed up as soon as possible.  Feel free to call the clinic you have any questions or concerns. The clinic phone number is [phone].  

## 2014-07-01 NOTE — Progress Notes (Signed)
[facility]  Telephone:([phone]) [phone] Fax:([phone]) [phone]     ID: Alexandria Bradley OB: 19-Aug-1961  MR#: [id number]  DDU#:[id number]  PCP: Lovenia Kim, MD GYN:   SU: [name] OTHER MD: [name], Delrae Rend, Wilford Corner  CHIEF COMPLAINT: Triple negative breast cancer  CURRENT TREATMENT: Adjuvant chemotherapy  BREAST CANCER HISTORY: From the original intake note of 02/16/2014:  Alexandria Bradley palpated a mass in her left breast early May and brought it immediately to her gynecologist attention. He set her up for bilateral diagnostic mammography and left ultrasonography at [facility] 02/07/2014. Mammography showed a 1.3 cm oval mass with spiculated margins in the left breast at the 11:00 position. This was palpable. Ultrasound showed a 1.1 cm tall her than wide mass with a microlobulated margins. He was hypoechoic. There were no abnormalities noted in the left axilla.  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 Lyndon Code at conference 02/16/2014), triple negative, with an MIB-1 of 50%.  The patient's subsequent history is as detailed below  INTERVAL HISTORY: Alexandria Bradley returns today for follow up of her breast cancer, accompanied by her husband [name]. Today is day 8, cycle 1 of abraxane. Last week the dose was held because she was headed to a colonoscopy on Monday 9/28. The procedure went well and all biopsies were negative.    REVIEW OF SYSTEMS: Alexandria Bradley denies fevers, chills, nausea, vomiting, or changes in bowel or bladder habits. She has no change in appetite, rash, or peripheral neuropathy symptoms. Her right side continues to have a dull ache off and on. It is worse at night and when she lays on her left side. She continues to have a productive cough that is occasionally yellow, and is treating this with robitussin. She finished her course of  levaquin earlier in the week. Alexandria Bradley has no shortness of breath, chest pain, palpations, or fatigue.     PAST MEDICAL HISTORY: Past Medical History  Diagnosis Date  . Thyroid disease   . Osteoporosis   . Psoriasis   . Breast cancer   . Arthritis   . Back pain   . Hypothyroidism   . Anxiety   . Breast cancer     left  . Hypertension   . PONV (postoperative nausea and vomiting)   . Hypercholesterolemia   . PVC's (premature ventricular contractions)   . Kidney stones   . Headache(784.0)     PMH : migraines    PAST SURGICAL HISTORY: Past Surgical History  Procedure Laterality Date  . Cholecystectomy    . Tonsillectomy    . Tubal ligation    . Myomectomy    . Lipoma excision    . Portacath placement N/A 03/01/2014    Procedure: INSERTION PORT-A-CATH;  Surgeon: [name], MD;  Location: [facility];  Service: General;  Laterality: N/A;  . Colonoscopy N/A 06/27/2014    Procedure: COLONOSCOPY;  Surgeon: Lear Ng, MD;  Location: [facility];  Service: Endoscopy;  Laterality: N/A;    FAMILY HISTORY Family History  Problem Relation Age of Onset  . Endometrial cancer Mother   . Lung cancer Father   . Brain cancer Father    the patient's father died at the age of 12 from what may have been metastatic lung cancer. The patient knows very little about the father's side of her family. Her mother is alive at age 47. She was recently  diagnosed with endometrial cancer. The patient has one brother, 2 sisters. There is no history of breast or ovarian cancer in the family.  GYNECOLOGIC HISTORY:  Menarche age 59, first live birth age 38. The patient is GX P3. One child died shortly after birth. She stopped having periods approximately 2005. She did not use hormone replacement. 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 Her husband [name] is a Fish farm manager. Son [name] is that she Nurse, adult in [place]. Daughter [name] lives in [place] and works for [employer] as an Therapist, sports.    ADVANCED DIRECTIVES: Not in place   HEALTH MAINTENANCE: History  Substance Use Topics  . Smoking status: Former Smoker    Types: Cigarettes  . Smokeless tobacco: Never Used     Comment: Quit smoking cigarettes in 2002  . Alcohol Use: Yes     Comment: social     Colonoscopy: 2010  PAP: 2013  Bone density: 05/15/2009, at [facility], normal, lowest T score -0.8  Lipid panel:  Allergies  Allergen Reactions  . Ciprofloxacin Anxiety and Rash  . Prednisone Shortness Of Breath and Other (See Comments)    "jacks me up" jittery  . Codeine Nausea Only and Other (See Comments)    Nausea   . Cortisone Swelling and Other (See Comments)    "jack me up"    Current Outpatient Prescriptions  Medication Sig Dispense Refill  . acetaminophen (TYLENOL) 500 MG tablet Take 1,000 mg by mouth every 6 (six) hours as needed for headache.       Marland Kitchen amLODipine (NORVASC) 5 MG tablet Take 5 mg by mouth daily.       . chlorpheniramine-HYDROcodone (TUSSIONEX) 10-8 MG/5ML LQCR Take 5 mLs by mouth at bedtime as needed for cough.  115 mL  0  . clonazePAM (KLONOPIN) 0.5 MG tablet Take 0.5 mg by mouth 2 (two) times daily as needed for anxiety.      Marland Kitchen liothyronine (CYTOMEL) 5 MCG tablet Take 5 mcg by mouth daily.       . prochlorperazine (COMPAZINE) 10 MG tablet Take 10 mg by mouth every 8 (eight) hours as needed for nausea or vomiting.      Marland Kitchen SYNTHROID 137 MCG tablet Take 137 mcg by mouth daily before breakfast.       . triamterene-hydrochlorothiazide (MAXZIDE-25) 37.5-25 MG per tablet Take 1 tablet by mouth daily.        No current facility-administered medications for this visit.    OBJECTIVE: Middle-aged white woman in no acute distress  Filed Vitals:   07/01/14 0853  BP: 134/82  Pulse: 87  Temp: 98.3 F (36.8 C)  Resp: 18     Body mass index is  31.87 kg/(m^2).    ECOG FS:1 - Symptomatic but completely ambulatory  Skin: warm, dry  HEENT: sclerae anicteric, conjunctivae pink, oropharynx clear. No thrush or mucositis.  Lymph Nodes: No cervical or supraclavicular lymphadenopathy  Lungs: clear to auscultation bilaterally, no rales, wheezes, or rhonci  Heart: regular rate and rhythm  Abdomen: round, soft, non tender, no rebound tenderness or guarding, positive bowel sounds  Musculoskeletal: No focal spinal tenderness, no peripheral edema  Neuro: non focal, well oriented, positive affect  Breasts: deferred    LAB RESULTS:  CMP     Component Value Date/Time   NA 140 07/01/2014 0829   NA 140 03/13/2009 2325  K 3.5 07/01/2014 0829   K 3.7 03/13/2009 2325   CL 105 03/13/2009 2325   CO2 22 07/01/2014 0829   CO2 29 03/13/2009 2325   GLUCOSE 110 07/01/2014 0829   GLUCOSE 107* 03/13/2009 2325   GLUCOSE 88 09/22/2006 1056   BUN 10.7 07/01/2014 0829   BUN 12 03/13/2009 2325   CREATININE 0.7 07/01/2014 0829   CREATININE 0.76 03/13/2009 2325   CALCIUM 10.0 07/01/2014 0829   CALCIUM 9.4 03/13/2009 2325   PROT 6.9 07/01/2014 0829   PROT 7.1 03/13/2009 2325   ALBUMIN 3.9 07/01/2014 0829   ALBUMIN 4.1 03/13/2009 2325   AST 31 07/01/2014 0829   AST 136* 03/13/2009 2325   ALT 48 07/01/2014 0829   ALT 303* 03/13/2009 2325   ALKPHOS 77 07/01/2014 0829   ALKPHOS 131* 03/13/2009 2325   BILITOT 0.59 07/01/2014 0829   BILITOT 0.5 03/13/2009 2325   GFRNONAA >60 03/13/2009 2325   GFRAA  Value: >60        The eGFR has been calculated using the MDRD equation. This calculation has not been validated in all clinical situations. eGFR's persistently <60 mL/min signify possible Chronic Kidney Disease. 03/13/2009 2325    I No results found for this basename: SPEP,  UPEP,   kappa and lambda light chains    Lab Results  Component Value Date   WBC 5.1 07/01/2014   NEUTROABS 3.1 07/01/2014   HGB 11.8 07/01/2014   HCT 35.7 07/01/2014   MCV 95.7 07/01/2014   PLT 328  07/01/2014      Chemistry      Component Value Date/Time   NA 140 07/01/2014 0829   NA 140 03/13/2009 2325   K 3.5 07/01/2014 0829   K 3.7 03/13/2009 2325   CL 105 03/13/2009 2325   CO2 22 07/01/2014 0829   CO2 29 03/13/2009 2325   BUN 10.7 07/01/2014 0829   BUN 12 03/13/2009 2325   CREATININE 0.7 07/01/2014 0829   CREATININE 0.76 03/13/2009 2325      Component Value Date/Time   CALCIUM 10.0 07/01/2014 0829   CALCIUM 9.4 03/13/2009 2325   ALKPHOS 77 07/01/2014 0829   ALKPHOS 131* 03/13/2009 2325   AST 31 07/01/2014 0829   AST 136* 03/13/2009 2325   ALT 48 07/01/2014 0829   ALT 303* 03/13/2009 2325   BILITOT 0.59 07/01/2014 0829   BILITOT 0.5 03/13/2009 2325       No results found for this basename: LABCA2    No components found with this basename: LABCA125    No results found for this basename: INR,  in the last 168 hours  Urinalysis    Component Value Date/Time   COLORURINE YELLOW 03/13/2009 2251   APPEARANCEUR CLOUDY* 03/13/2009 2251   LABSPEC 1.020 06/15/2014 1101   LABSPEC 1.018 03/13/2009 2251   PHURINE 5.5 03/13/2009 2251   GLUCOSEU Negative 06/15/2014 1101   GLUCOSEU NEGATIVE 03/13/2009 2251   HGBUR NEGATIVE 03/13/2009 2251   BILIRUBINUR NEGATIVE 03/13/2009 2251   KETONESUR NEGATIVE 03/13/2009 2251   PROTEINUR NEGATIVE 03/13/2009 2251   UROBILINOGEN 0.2 06/15/2014 1101   UROBILINOGEN 0.2 03/13/2009 2251   NITRITE NEGATIVE 03/13/2009 2251   LEUKOCYTESUR SMALL* 03/13/2009 2251    STUDIES: Ct Abdomen Pelvis W Contrast  06/17/2014   CLINICAL DATA:  Two weeks of worsening right upper quadrant and right flank pain; history of breast malignancy, currently undergoing chemotherapy  EXAM: CT ABDOMEN AND PELVIS WITH CONTRAST  TECHNIQUE: Multidetector CT imaging of the abdomen and pelvis   was performed using the standard protocol following bolus administration of intravenous contrast.  CONTRAST:  121m OMNIPAQUE IOHEXOL 300 MG/ML  SOLN  COMPARISON:  CT scan of the abdomen and pelvis dated March 14, 2009  FINDINGS: The liver exhibits decreased density diffusely. There is subtle hypodensity near the porta hepatis adjacent to the surgical clips which becomes isodense on delayed images compatible with hemangioma. There is no intrahepatic ductal dilation. The pancreas, spleen, adrenal glands, and kidneys exhibit no acute abnormalities. There are 2 stable accessory spleens measuring 1.5 cm in diameter. On delayed images contrast within the renal collecting systems is normal.  The stomach, duodenum, and remainder of the small bowel are normal. There is a 2 cm area of luminal narrowing and mural thickening of the mid transverse colon. This is demonstrated on images 47 through 50 of series 2. It is visible on coronal image #31 and sagittal image # 111. It is not producing significant obstruction. There are a few scattered diverticula in the sigmoid colon.  The urinary bladder, uterus, and adnexal structures are normal. There is no free pelvic fluid. There is no inguinal nor significant umbilical hernia.  The lumbar spine and bony pelvis are unremarkable. The lung bases are clear.  IMPRESSION: 1. There is new short segment mid transverse colon luminal narrowing and mural thickening. The possibility of a primary or metastatic malignancy is raised. Direct visualization is recommended. 2. There fatty infiltrative changes of the liver. There are no findings suspicious for metastatic disease to the liver. The gallbladder is surgically absent. 3. There is no acute urinary tract or gynecological abnormality. 4. These results will be called to the ordering clinician or representative by the Radiologist Assistant, and communication documented in the PACS or zVision Dashboard.   Electronically Signed   By: David  JMartinique  On: 06/17/2014 16:36    ASSESSMENT: 53y.o. [place] woman status post left breast biopsy 02/08/2014 for a clinical T1c N0, stage IA invasive ductal carcinoma, grade 3, triple negative, with an MIB-1 of  50%.  (1) status post left lumpectomy and sentinel lymph node sampling 03/01/2014 for a pT1c pN0, stage IA invasive ductal carcinoma, grade 3, with negative margins, repeat prognostic panel again triple negative.  (2) adjuvant chemotherapy started a 03/25/2014 consisting of cyclophosphamide and doxorubicin given in dose dense fashion x4, completed 05/06/2014; followed by weekly paclitaxel x1, on 05/27/2014, poorly tolerated; switched to Abraxane with second dose, starting 06/10/2014, with 11 Abraxane doses planned  (3) adjuvant radiation to follow chemotherapy  (4) skin rash--resolved  (5) question of sleep apnea  (6) transverse colon lesion noted on CT scan from 06/17/2014 - negative biopsy  PLAN: CIonnaappears well today. The labs were reviewed and were stable for treatment. She will proceed with day 8, cycle 2 of abraxane with the dose dropped to 836mm2 per Dr. MaVirgie Dadrevious progress note.   As for her right sided pain, she has a good relationship with Dr. ScMichail Sermonwho was responsible for her colonoscopy. She will follow up with him regarding this pain to see what further tests can be run.   CaIdamayill return next week for lab, an office visit, and day 15 of this cycle. She understands and agrees with this plan. She knows the goal of treatment in her case is cure. She has been encouraged to call with any issues that might arise before her next visit here.    07/01/2014 9:09 AM  FeMarcelino DusterNP

## 2014-07-08 ENCOUNTER — Telehealth: Payer: Self-pay | Admitting: *Deleted

## 2014-07-08 ENCOUNTER — Encounter: Payer: Self-pay | Admitting: Nurse Practitioner

## 2014-07-08 ENCOUNTER — Ambulatory Visit (HOSPITAL_BASED_OUTPATIENT_CLINIC_OR_DEPARTMENT_OTHER): Payer: 59

## 2014-07-08 ENCOUNTER — Other Ambulatory Visit (HOSPITAL_BASED_OUTPATIENT_CLINIC_OR_DEPARTMENT_OTHER): Payer: 59

## 2014-07-08 ENCOUNTER — Ambulatory Visit: Payer: 59

## 2014-07-08 ENCOUNTER — Telehealth: Payer: Self-pay | Admitting: Nurse Practitioner

## 2014-07-08 ENCOUNTER — Ambulatory Visit (HOSPITAL_BASED_OUTPATIENT_CLINIC_OR_DEPARTMENT_OTHER): Payer: 59 | Admitting: Nurse Practitioner

## 2014-07-08 VITALS — BP 134/79 | HR 97 | Temp 98.3°F | Resp 19 | Ht 65.0 in | Wt 190.7 lb

## 2014-07-08 DIAGNOSIS — C50212 Malignant neoplasm of upper-inner quadrant of left female breast: Secondary | ICD-10-CM

## 2014-07-08 DIAGNOSIS — R1011 Right upper quadrant pain: Secondary | ICD-10-CM

## 2014-07-08 DIAGNOSIS — Z171 Estrogen receptor negative status [ER-]: Secondary | ICD-10-CM

## 2014-07-08 DIAGNOSIS — R945 Abnormal results of liver function studies: Secondary | ICD-10-CM

## 2014-07-08 DIAGNOSIS — R748 Abnormal levels of other serum enzymes: Secondary | ICD-10-CM | POA: Insufficient documentation

## 2014-07-08 DIAGNOSIS — Z5111 Encounter for antineoplastic chemotherapy: Secondary | ICD-10-CM

## 2014-07-08 DIAGNOSIS — K760 Fatty (change of) liver, not elsewhere classified: Secondary | ICD-10-CM | POA: Insufficient documentation

## 2014-07-08 LAB — CBC WITH DIFFERENTIAL/PLATELET
BASO%: 1.2 % (ref 0.0–2.0)
BASOS ABS: 0.1 10*3/uL (ref 0.0–0.1)
EOS ABS: 0.4 10*3/uL (ref 0.0–0.5)
EOS%: 8.6 % — ABNORMAL HIGH (ref 0.0–7.0)
HEMATOCRIT: 34.7 % — AB (ref 34.8–46.6)
HGB: 11.4 g/dL — ABNORMAL LOW (ref 11.6–15.9)
LYMPH%: 20.7 % (ref 14.0–49.7)
MCH: 31.5 pg (ref 25.1–34.0)
MCHC: 32.9 g/dL (ref 31.5–36.0)
MCV: 95.9 fL (ref 79.5–101.0)
MONO#: 0.3 10*3/uL (ref 0.1–0.9)
MONO%: 6.3 % (ref 0.0–14.0)
NEUT%: 63.2 % (ref 38.4–76.8)
NEUTROS ABS: 2.7 10*3/uL (ref 1.5–6.5)
NRBC: 0 % (ref 0–0)
PLATELETS: 331 10*3/uL (ref 145–400)
RBC: 3.62 10*6/uL — ABNORMAL LOW (ref 3.70–5.45)
RDW: 13.8 % (ref 11.2–14.5)
WBC: 4.3 10*3/uL (ref 3.9–10.3)
lymph#: 0.9 10*3/uL (ref 0.9–3.3)

## 2014-07-08 LAB — COMPREHENSIVE METABOLIC PANEL (CC13)
ALBUMIN: 4 g/dL (ref 3.5–5.0)
ALT: 56 U/L — ABNORMAL HIGH (ref 0–55)
AST: 35 U/L — AB (ref 5–34)
Alkaline Phosphatase: 73 U/L (ref 40–150)
Anion Gap: 12 mEq/L — ABNORMAL HIGH (ref 3–11)
BUN: 8.3 mg/dL (ref 7.0–26.0)
CHLORIDE: 104 meq/L (ref 98–109)
CO2: 23 mEq/L (ref 22–29)
Calcium: 10.1 mg/dL (ref 8.4–10.4)
Creatinine: 0.8 mg/dL (ref 0.6–1.1)
Glucose: 169 mg/dl — ABNORMAL HIGH (ref 70–140)
POTASSIUM: 3.2 meq/L — AB (ref 3.5–5.1)
Sodium: 139 mEq/L (ref 136–145)
TOTAL PROTEIN: 7 g/dL (ref 6.4–8.3)
Total Bilirubin: 0.39 mg/dL (ref 0.20–1.20)

## 2014-07-08 MED ORDER — SODIUM CHLORIDE 0.9 % IJ SOLN
10.0000 mL | INTRAMUSCULAR | Status: DC | PRN
  Administered 2014-07-08: 10 mL
  Filled 2014-07-08: qty 10

## 2014-07-08 MED ORDER — DEXAMETHASONE SODIUM PHOSPHATE 10 MG/ML IJ SOLN
10.0000 mg | Freq: Once | INTRAMUSCULAR | Status: AC
  Administered 2014-07-08: 10 mg via INTRAVENOUS

## 2014-07-08 MED ORDER — ONDANSETRON 8 MG/50ML IVPB (CHCC)
8.0000 mg | Freq: Once | INTRAVENOUS | Status: AC
  Administered 2014-07-08: 8 mg via INTRAVENOUS

## 2014-07-08 MED ORDER — HEPARIN SOD (PORK) LOCK FLUSH 100 UNIT/ML IV SOLN
500.0000 [IU] | Freq: Once | INTRAVENOUS | Status: AC | PRN
  Administered 2014-07-08: 500 [IU]
  Filled 2014-07-08: qty 5

## 2014-07-08 MED ORDER — SODIUM CHLORIDE 0.9 % IV SOLN
Freq: Once | INTRAVENOUS | Status: AC
  Administered 2014-07-08: 13:00:00 via INTRAVENOUS

## 2014-07-08 MED ORDER — PACLITAXEL PROTEIN-BOUND CHEMO INJECTION 100 MG
80.0000 mg/m2 | Freq: Once | INTRAVENOUS | Status: AC
  Administered 2014-07-08: 150 mg via INTRAVENOUS
  Filled 2014-07-08: qty 30

## 2014-07-08 MED ORDER — ONDANSETRON 8 MG/NS 50 ML IVPB
INTRAVENOUS | Status: AC
  Filled 2014-07-08: qty 8

## 2014-07-08 MED ORDER — DEXAMETHASONE SODIUM PHOSPHATE 10 MG/ML IJ SOLN
INTRAMUSCULAR | Status: AC
  Filled 2014-07-08: qty 1

## 2014-07-08 NOTE — Telephone Encounter (Signed)
Per staff message and POF I have scheduled appts. Advised scheduler of appts. JMW  

## 2014-07-08 NOTE — Patient Instructions (Signed)
[facility] Discharge Instructions for Patients Receiving Chemotherapy  Today you received the following chemotherapy agents Abraxane.   To help prevent nausea and vomiting after your treatment, we encourage you to take your nausea medication.   If you develop nausea and vomiting that is not controlled by your nausea medication, call the clinic.   BELOW ARE SYMPTOMS THAT SHOULD BE REPORTED IMMEDIATELY:  *FEVER GREATER THAN 100.5 F  *CHILLS WITH OR WITHOUT FEVER  NAUSEA AND VOMITING THAT IS NOT CONTROLLED WITH YOUR NAUSEA MEDICATION  *UNUSUAL SHORTNESS OF BREATH  *UNUSUAL BRUISING OR BLEEDING  TENDERNESS IN MOUTH AND THROAT WITH OR WITHOUT PRESENCE OF ULCERS  *URINARY PROBLEMS  *BOWEL PROBLEMS  UNUSUAL RASH Items with * indicate a potential emergency and should be followed up as soon as possible.  Feel free to call the clinic you have any questions or concerns. The clinic phone number is [phone].    

## 2014-07-08 NOTE — Progress Notes (Signed)
[name]  [phone] [phone]) [phone]     ID: Alexandria Bradley OB: 08-Mar-1961  MR#: [id number]  XFG#:[id number]  PCP: Lovenia Kim, MD GYN:   SU: [name] OTHER MD: [name], Delrae Rend, Wilford Corner  CHIEF COMPLAINT: Triple negative breast cancer  CURRENT TREATMENT: Adjuvant chemotherapy  BREAST CANCER HISTORY: From the original intake note of 02/16/2014:  Alexandria Bradley palpated a mass in her left breast early May and brought it immediately to her gynecologist attention. He set her up for bilateral diagnostic mammography and left ultrasonography at [facility] 02/07/2014. Mammography showed a 1.3 cm oval mass with spiculated margins in the left breast at the 11:00 position. This was palpable. Ultrasound showed a 1.1 cm tall her than wide mass with a microlobulated margins. He was hypoechoic. There were no abnormalities noted in the left axilla.  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 Lyndon Code at conference 02/16/2014), triple negative, with an MIB-1 of 50%.  The patient's subsequent history is as detailed below  INTERVAL HISTORY: Alexandria Bradley returns today for follow up of her breast cancer. Today is day 15, cycle 12 of abraxane. She is tolerating treatment moderately well with a few complaints.  REVIEW OF SYSTEMS: Alexandria Bradley denies fevers or chills. She is recovering from a mild cough, likely related to a viral upper respiratory infection. She had some mild nausea, but this was relieved with compazine. Her stools are occasionally loose and can occur up to 2-3 times daily. She is managing this on her own and prefers to avoid the use of anti-diarrheals. She continues to feel some stomach aches to her right side, but this is not always present. She has a history of PVCs and may have felt some last night and this morning. This  sometimes occurs when she is dehydrated. She will make a better effort to keep up with her oral hydration. Her appetite is healthy. A detailed review of systems is otherwise noncontributory.    PAST MEDICAL HISTORY: Past Medical History  Diagnosis Date  . Thyroid disease   . Osteoporosis   . Psoriasis   . Breast cancer   . Arthritis   . Back pain   . Hypothyroidism   . Anxiety   . Breast cancer     left  . Hypertension   . PONV (postoperative nausea and vomiting)   . Hypercholesterolemia   . PVC's (premature ventricular contractions)   . Kidney stones   . Headache(784.0)     PMH : migraines    PAST SURGICAL HISTORY: Past Surgical History  Procedure Laterality Date  . Cholecystectomy    . Tonsillectomy    . Tubal ligation    . Myomectomy    . Lipoma excision    . Portacath placement N/A 03/01/2014    Procedure: INSERTION PORT-A-CATH;  Surgeon: [name], MD;  Location: [place];  Service: General;  Laterality: N/A;  . Colonoscopy N/A 06/27/2014    Procedure: COLONOSCOPY;  Surgeon: Lear Ng, MD;  Location: [place];  Service: Endoscopy;  Laterality: N/A;    FAMILY HISTORY Family History  Problem Relation Age of Onset  . Endometrial cancer Mother   . Lung cancer Father   . Brain cancer Father    the patient's father died at the age of 80 from what may have been metastatic lung cancer. The patient knows very little about the father's  side of her family. Her mother is alive at age 40. She was recently diagnosed with endometrial cancer. The patient has one brother, 2 sisters. There is no history of breast or ovarian cancer in the family.  GYNECOLOGIC HISTORY:  Menarche age 23, first live birth age 39. The patient is GX P3. One child died shortly after birth. She stopped having periods approximately 2005. She did not use hormone replacement. 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 Her husband Alexandria Bradley is a Insurance underwriter. Son Alexandria Bradley is that she Nurse, adult in [place]. Daughter Alexandria Bradley lives in [place] and works for [employer] as an Therapist, sports.    ADVANCED DIRECTIVES: Not in place   HEALTH MAINTENANCE: History  Substance Use Topics  . Smoking status: Former Smoker    Types: Cigarettes  . Smokeless tobacco: Never Used     Comment: Quit smoking cigarettes in 2002  . Alcohol Use: Yes     Comment: social     Colonoscopy: 2010  PAP: 2013  Bone density: 05/15/2009, at [facility], normal, lowest T score -0.8  Lipid panel:  Allergies  Allergen Reactions  . Ciprofloxacin Anxiety and Rash  . Prednisone Shortness Of Breath and Other (See Comments)    "jacks me up" jittery  . Codeine Nausea Only and Other (See Comments)    Nausea   . Cortisone Swelling and Other (See Comments)    "jack me up"    Current Outpatient Prescriptions  Medication Sig Dispense Refill  . acetaminophen (TYLENOL) 500 MG tablet Take 1,000 mg by mouth every 6 (six) hours as needed for headache.       Marland Kitchen amLODipine (NORVASC) 5 MG tablet Take 5 mg by mouth daily.       . chlorpheniramine-HYDROcodone (TUSSIONEX) 10-8 MG/5ML LQCR Take 5 mLs by mouth at bedtime as needed for cough.  115 mL  0  . clonazePAM (KLONOPIN) 0.5 MG tablet Take 0.5 mg by mouth 2 (two) times daily as needed for anxiety.      Marland Kitchen liothyronine (CYTOMEL) 5 MCG tablet Take 5 mcg by mouth daily.       . prochlorperazine (COMPAZINE) 10 MG tablet Take 10 mg by mouth every 8 (eight) hours as needed for nausea or vomiting.      Marland Kitchen SYNTHROID 137 MCG tablet Take 137 mcg by mouth daily before breakfast.       . triamterene-hydrochlorothiazide (MAXZIDE-25) 37.5-25 MG per tablet Take 1 tablet by mouth daily.        No current facility-administered medications for this visit.   Facility-Administered Medications Ordered in Other Visits  Medication Dose Route  Frequency Provider Last Rate Last Dose  . heparin lock flush 100 unit/mL  500 Units Intracatheter Once PRN Chauncey Cruel, MD      . sodium chloride 0.9 % injection 10 mL  10 mL Intracatheter PRN Chauncey Cruel, MD        OBJECTIVE: Middle-aged white woman in no acute distress  Filed Vitals:   07/08/14 1125  BP: 134/79  Pulse: 97  Temp: 98.3 F (36.8 C)  Resp: 19     Body mass index is 31.73 kg/(m^2).    ECOG FS:1 - Symptomatic but completely ambulatory  Sclerae unicteric, pupils equal and reactive Oropharynx clear and moist-- no thrush No cervical or supraclavicular adenopathy Lungs no rales or rhonchi Heart regular rate and rhythm  Abd soft, nontender, positive bowel sounds MSK no focal spinal tenderness, no upper extremity lymphedema Neuro: nonfocal, well oriented, appropriate affect Breasts: deferred  LAB RESULTS: I No results found for this basename: SPEP,  UPEP,   kappa and lambda light chains    Lab Results  Component Value Date   WBC 4.3 07/08/2014   NEUTROABS 2.7 07/08/2014   HGB 11.4* 07/08/2014   HCT 34.7* 07/08/2014   MCV 95.9 07/08/2014   PLT 331 07/08/2014      Chemistry      Component Value Date/Time   NA 139 07/08/2014 1107   NA 140 03/13/2009 2325   K 3.2* 07/08/2014 1107   K 3.7 03/13/2009 2325   CL 105 03/13/2009 2325   CO2 23 07/08/2014 1107   CO2 29 03/13/2009 2325   BUN 8.3 07/08/2014 1107   BUN 12 03/13/2009 2325   CREATININE 0.8 07/08/2014 1107   CREATININE 0.76 03/13/2009 2325      Component Value Date/Time   CALCIUM 10.1 07/08/2014 1107   CALCIUM 9.4 03/13/2009 2325   ALKPHOS 73 07/08/2014 1107   ALKPHOS 131* 03/13/2009 2325   AST 35* 07/08/2014 1107   AST 136* 03/13/2009 2325   ALT 56* 07/08/2014 1107   ALT 303* 03/13/2009 2325   BILITOT 0.39 07/08/2014 1107   BILITOT 0.5 03/13/2009 2325       No results found for this basename: LABCA2    No components found with this basename: [id number]    No results found for this basename: INR,  in  the last 168 hours  Urinalysis    Component Value Date/Time   COLORURINE YELLOW 03/13/2009 2251   APPEARANCEUR CLOUDY* 03/13/2009 2251   LABSPEC 1.020 06/15/2014 1101   LABSPEC 1.018 03/13/2009 2251   PHURINE 5.5 03/13/2009 2251   GLUCOSEU Negative 06/15/2014 1101   GLUCOSEU NEGATIVE 03/13/2009 2251   HGBUR NEGATIVE 03/13/2009 2251   BILIRUBINUR NEGATIVE 03/13/2009 [place] 03/13/2009 2251   PROTEINUR NEGATIVE 03/13/2009 2251   UROBILINOGEN 0.2 06/15/2014 1101   UROBILINOGEN 0.2 03/13/2009 2251   NITRITE NEGATIVE 03/13/2009 2251   LEUKOCYTESUR SMALL* 03/13/2009 2251    STUDIES: Ct Abdomen Pelvis W Contrast  06/17/2014   CLINICAL DATA:  Two weeks of worsening right upper quadrant and right flank pain; history of breast malignancy, currently undergoing chemotherapy  EXAM: CT ABDOMEN AND PELVIS WITH CONTRAST  TECHNIQUE: Multidetector CT imaging of the abdomen and pelvis was performed using the standard protocol following bolus administration of intravenous contrast.  CONTRAST:  134mL OMNIPAQUE IOHEXOL 300 MG/ML  SOLN  COMPARISON:  CT scan of the abdomen and pelvis dated March 14, 2009  FINDINGS: The liver exhibits decreased density diffusely. There is subtle hypodensity near the porta hepatis adjacent to the surgical clips which becomes isodense on delayed images compatible with hemangioma. There is no intrahepatic ductal dilation. The pancreas, spleen, adrenal glands, and kidneys exhibit no acute abnormalities. There are 2 stable accessory spleens measuring 1.5 cm in diameter. On delayed images contrast within the renal collecting systems is normal.  The stomach, duodenum, and remainder of the small bowel are normal. There is a 2 cm area of luminal narrowing and mural thickening of the mid transverse colon. This is demonstrated on images 47 through 50 of series 2. It is visible on coronal image #31 and sagittal image # 111. It is not producing significant obstruction. There are a few scattered  diverticula in the sigmoid colon.  The urinary bladder, uterus, and  adnexal structures are normal. There is no free pelvic fluid. There is no inguinal nor significant umbilical hernia.  The lumbar spine and bony pelvis are unremarkable. The lung bases are clear.  IMPRESSION: 1. There is new short segment mid transverse colon luminal narrowing and mural thickening. The possibility of a primary or metastatic malignancy is raised. Direct visualization is recommended. 2. There fatty infiltrative changes of the liver. There are no findings suspicious for metastatic disease to the liver. The gallbladder is surgically absent. 3. There is no acute urinary tract or gynecological abnormality. 4. These results will be called to the ordering clinician or representative by the Radiologist Assistant, and communication documented in the PACS or zVision Dashboard.   Electronically Signed   By: David  Martinique   On: 06/17/2014 16:36    ASSESSMENT: 53 y.o. [place] woman status post left breast biopsy 02/08/2014 for a clinical T1c N0, stage IA invasive ductal carcinoma, grade 3, triple negative, with an MIB-1 of 50%.  (1) status post left lumpectomy and sentinel lymph node sampling 03/01/2014 for a pT1c pN0, stage IA invasive ductal carcinoma, grade 3, with negative margins, repeat prognostic panel again triple negative.  (2) adjuvant chemotherapy started a 03/25/2014 consisting of cyclophosphamide and doxorubicin given in dose dense fashion x4, completed 05/06/2014; followed by weekly paclitaxel x1, on 05/27/2014, poorly tolerated; switched to Abraxane with second dose, starting 06/10/2014, with 11 Abraxane doses planned (dose reduced by 20% because of neutropenia on 07/01/14)  (3) adjuvant radiation to follow chemotherapy  (4) skin rash--resolved  (5) question of sleep apnea  (6) transverse colon lesion noted on CT scan from 06/17/2014 - negative biopsy  PLAN: The labs were reviewed in detail, and she has had a  mild elevation her AST and ALT that put these values just out of normal range. The CT scan from 06/17/14 does document a fatty liver. We will proceed with day 15, cycle 1 of abraxane today and continue to watch these values. Reminder that starting with day 8, cycle 1, the dose was reduced by 20%.   Maurisa will return next week for the start of cycle 2 of treatment. She understands and agrees with this plan. She knows the goal of treatment in her case is cure. She has been encouraged to call with any issues that might arise before her next visit here.  07/08/2014 1:58 PM  Marcelino Duster, NP

## 2014-07-11 ENCOUNTER — Other Ambulatory Visit: Payer: Self-pay | Admitting: Oncology

## 2014-07-11 ENCOUNTER — Telehealth: Payer: Self-pay | Admitting: *Deleted

## 2014-07-11 MED ORDER — POTASSIUM CHLORIDE CRYS ER 20 MEQ PO TBCR: 20.0000 meq | EXTENDED_RELEASE_TABLET | Freq: Every day | ORAL | Status: DC

## 2014-07-11 NOTE — Telephone Encounter (Signed)
Called inform pt of Potassium level(3.2L). Told pt a Rx of Potassium 20 mEq has been sent to her pharmacy per Charlestine Massed, NP. Instructed pt she is to take 1- 48mEq tablet daily x 5 days. Pt is to come this Friday 10/16 for labs. Pt verbalized understanding. No further concerns. Message to be forwarded to Charlestine Massed, NP.

## 2014-07-15 ENCOUNTER — Other Ambulatory Visit: Payer: Self-pay | Admitting: *Deleted

## 2014-07-15 ENCOUNTER — Ambulatory Visit (HOSPITAL_BASED_OUTPATIENT_CLINIC_OR_DEPARTMENT_OTHER): Payer: 59

## 2014-07-15 ENCOUNTER — Other Ambulatory Visit (HOSPITAL_BASED_OUTPATIENT_CLINIC_OR_DEPARTMENT_OTHER): Payer: 59

## 2014-07-15 ENCOUNTER — Telehealth: Payer: Self-pay | Admitting: Nurse Practitioner

## 2014-07-15 ENCOUNTER — Ambulatory Visit (HOSPITAL_BASED_OUTPATIENT_CLINIC_OR_DEPARTMENT_OTHER): Payer: 59 | Admitting: Nurse Practitioner

## 2014-07-15 ENCOUNTER — Encounter: Payer: Self-pay | Admitting: Nurse Practitioner

## 2014-07-15 VITALS — BP 129/78 | HR 95 | Temp 98.2°F | Resp 18 | Ht 65.0 in | Wt 194.5 lb

## 2014-07-15 DIAGNOSIS — R1011 Right upper quadrant pain: Secondary | ICD-10-CM

## 2014-07-15 DIAGNOSIS — C50212 Malignant neoplasm of upper-inner quadrant of left female breast: Secondary | ICD-10-CM

## 2014-07-15 DIAGNOSIS — Z5111 Encounter for antineoplastic chemotherapy: Secondary | ICD-10-CM

## 2014-07-15 DIAGNOSIS — Z171 Estrogen receptor negative status [ER-]: Secondary | ICD-10-CM

## 2014-07-15 LAB — CBC WITH DIFFERENTIAL/PLATELET
BASO%: 0.6 % (ref 0.0–2.0)
Basophils Absolute: 0 10*3/uL (ref 0.0–0.1)
EOS ABS: 0.2 10*3/uL (ref 0.0–0.5)
EOS%: 7.4 % — ABNORMAL HIGH (ref 0.0–7.0)
HCT: 33.8 % — ABNORMAL LOW (ref 34.8–46.6)
HEMOGLOBIN: 11.2 g/dL — AB (ref 11.6–15.9)
LYMPH%: 25.8 % (ref 14.0–49.7)
MCH: 31.9 pg (ref 25.1–34.0)
MCHC: 33.1 g/dL (ref 31.5–36.0)
MCV: 96.3 fL (ref 79.5–101.0)
MONO#: 0.2 10*3/uL (ref 0.1–0.9)
MONO%: 4.8 % (ref 0.0–14.0)
NEUT#: 1.9 10*3/uL (ref 1.5–6.5)
NEUT%: 61.4 % (ref 38.4–76.8)
Platelets: 325 10*3/uL (ref 145–400)
RBC: 3.51 10*6/uL — AB (ref 3.70–5.45)
RDW: 13.9 % (ref 11.2–14.5)
WBC: 3.1 10*3/uL — ABNORMAL LOW (ref 3.9–10.3)
lymph#: 0.8 10*3/uL — ABNORMAL LOW (ref 0.9–3.3)
nRBC: 0 % (ref 0–0)

## 2014-07-15 LAB — COMPREHENSIVE METABOLIC PANEL (CC13)
ALT: 57 U/L — ABNORMAL HIGH (ref 0–55)
ANION GAP: 11 meq/L (ref 3–11)
AST: 34 U/L (ref 5–34)
Albumin: 4 g/dL (ref 3.5–5.0)
Alkaline Phosphatase: 66 U/L (ref 40–150)
BILIRUBIN TOTAL: 0.46 mg/dL (ref 0.20–1.20)
BUN: 11.1 mg/dL (ref 7.0–26.0)
CO2: 24 meq/L (ref 22–29)
Calcium: 10.1 mg/dL (ref 8.4–10.4)
Chloride: 105 mEq/L (ref 98–109)
Creatinine: 0.7 mg/dL (ref 0.6–1.1)
GLUCOSE: 103 mg/dL (ref 70–140)
Potassium: 4 mEq/L (ref 3.5–5.1)
Sodium: 141 mEq/L (ref 136–145)
Total Protein: 6.6 g/dL (ref 6.4–8.3)

## 2014-07-15 MED ORDER — DEXAMETHASONE SODIUM PHOSPHATE 10 MG/ML IJ SOLN
10.0000 mg | Freq: Once | INTRAMUSCULAR | Status: AC
  Administered 2014-07-15: 10 mg via INTRAVENOUS

## 2014-07-15 MED ORDER — LIDOCAINE-PRILOCAINE 2.5-2.5 % EX CREA: 1.0000 "application " | TOPICAL_CREAM | CUTANEOUS | Status: DC | PRN

## 2014-07-15 MED ORDER — HEPARIN SOD (PORK) LOCK FLUSH 100 UNIT/ML IV SOLN
500.0000 [IU] | Freq: Once | INTRAVENOUS | Status: AC | PRN
  Administered 2014-07-15: 500 [IU]
  Filled 2014-07-15: qty 5

## 2014-07-15 MED ORDER — ONDANSETRON 8 MG/NS 50 ML IVPB
INTRAVENOUS | Status: AC
  Filled 2014-07-15: qty 8

## 2014-07-15 MED ORDER — ONDANSETRON 8 MG/50ML IVPB (CHCC)
8.0000 mg | Freq: Once | INTRAVENOUS | Status: AC
  Administered 2014-07-15: 8 mg via INTRAVENOUS

## 2014-07-15 MED ORDER — DEXAMETHASONE SODIUM PHOSPHATE 10 MG/ML IJ SOLN
INTRAMUSCULAR | Status: AC
  Filled 2014-07-15: qty 1

## 2014-07-15 MED ORDER — PACLITAXEL PROTEIN-BOUND CHEMO INJECTION 100 MG
80.0000 mg/m2 | Freq: Once | INTRAVENOUS | Status: AC
  Administered 2014-07-15: 150 mg via INTRAVENOUS
  Filled 2014-07-15: qty 30

## 2014-07-15 MED ORDER — SODIUM CHLORIDE 0.9 % IV SOLN
Freq: Once | INTRAVENOUS | Status: AC
  Administered 2014-07-15: 10:00:00 via INTRAVENOUS

## 2014-07-15 MED ORDER — SODIUM CHLORIDE 0.9 % IJ SOLN
10.0000 mL | INTRAMUSCULAR | Status: DC | PRN
  Administered 2014-07-15: 10 mL
  Filled 2014-07-15: qty 10

## 2014-07-15 NOTE — Progress Notes (Signed)
[place]  [phone] [phone]     ID: Alexandria Bradley OB: 1960-11-14  MR#: [id number]  AJG#:[id number]  PCP: Lovenia Kim, MD GYN:   SU: [name] OTHER MD: [name], Delrae Rend, Wilford Corner  CHIEF COMPLAINT: Triple negative breast cancer  CURRENT TREATMENT: Adjuvant chemotherapy  BREAST CANCER HISTORY: From the original intake note of 02/16/2014:  Alexandria Bradley palpated a mass in her left breast early May and brought it immediately to her gynecologist attention. He set her up for bilateral diagnostic mammography and left ultrasonography at [facility] 02/07/2014. Mammography showed a 1.3 cm oval mass with spiculated margins in the left breast at the 11:00 position. This was palpable. Ultrasound showed a 1.1 cm tall her than wide mass with a microlobulated margins. He was hypoechoic. There were no abnormalities noted in the left axilla.  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 Lyndon Code at conference 02/16/2014), triple negative, with an MIB-1 of 50%.  The patient's subsequent history is as detailed below  INTERVAL HISTORY: Alexandria Bradley returns today for follow up of her breast cancer, accompanied by her husband. Today is week 4 of 11 weekly cycles of abraxane. Her main complaint this past week is joint pain after chemo. She took naproxen once, and it was helpful. However, she is using the drug sparingly for fear of stomach ulcers.   REVIEW OF SYSTEMS: Alexandria Bradley denies fever or chills. She has some mild nausea, relieved with compazine. Her stools have become more formed and regular. She continues to deal with her upper respiratory sinus issues, symptomatically. Her right sided upper quadrant pain continues and is more persistent this week. Her appetite is healthy, she is staying hydrated. She denies peripheral neuropathy  symptoms. She has no shortness of breath, palpitations, cough, or fatigue. A detailed review of systems is otherwise noncontributory.    PAST MEDICAL HISTORY: Past Medical History  Diagnosis Date  . Thyroid disease   . Osteoporosis   . Psoriasis   . Breast cancer   . Arthritis   . Back pain   . Hypothyroidism   . Anxiety   . Breast cancer     left  . Hypertension   . PONV (postoperative nausea and vomiting)   . Hypercholesterolemia   . PVC's (premature ventricular contractions)   . Kidney stones   . Headache(784.0)     PMH : migraines    PAST SURGICAL HISTORY: Past Surgical History  Procedure Laterality Date  . Cholecystectomy    . Tonsillectomy    . Tubal ligation    . Myomectomy    . Lipoma excision    . Portacath placement N/A 03/01/2014    Procedure: INSERTION PORT-A-CATH;  Surgeon: [name], MD;  Location: [facility];  Service: General;  Laterality: N/A;  . Colonoscopy N/A 06/27/2014    Procedure: COLONOSCOPY;  Surgeon: Lear Ng, MD;  Location: [place];  Service: Endoscopy;  Laterality: N/A;    FAMILY HISTORY Family History  Problem Relation Age of Onset  . Endometrial cancer Mother   . Lung cancer Father   . Brain cancer Father    the patient's father died at the age of 106 from what may have been metastatic lung cancer. The patient knows very little about the father's side of her family. Her mother is alive at age 56. She was recently diagnosed with endometrial cancer. The patient  has one brother, 2 sisters. There is no history of breast or ovarian cancer in the family.  GYNECOLOGIC HISTORY:  Menarche age 2, first live birth age 31. The patient is GX P3. One child died shortly after birth. She stopped having periods approximately 2005. She did not use hormone replacement. 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  Her husband Alexandria Bradley is a Insurance underwriter. Son Alexandria Bradley is that she Nurse, adult in [place]. Daughter Alexandria Bradley lives in [place] and works for [employer] as an Therapist, sports.    ADVANCED DIRECTIVES: Not in place   HEALTH MAINTENANCE: History  Substance Use Topics  . Smoking status: Former Smoker    Types: Cigarettes  . Smokeless tobacco: Never Used     Comment: Quit smoking cigarettes in 2002  . Alcohol Use: Yes     Comment: social     Colonoscopy: 2010  PAP: 2013  Bone density: 05/15/2009, at [facility], normal, lowest T score -0.8  Lipid panel:  Allergies  Allergen Reactions  . Ciprofloxacin Anxiety and Rash  . Prednisone Shortness Of Breath and Other (See Comments)    "jacks me up" jittery  . Codeine Nausea Only and Other (See Comments)    Nausea   . Cortisone Swelling and Other (See Comments)    "jack me up"    Current Outpatient Prescriptions  Medication Sig Dispense Refill  . amLODipine (NORVASC) 5 MG tablet Take 5 mg by mouth daily.       . chlorpheniramine-HYDROcodone (TUSSIONEX) 10-8 MG/5ML LQCR Take 5 mLs by mouth at bedtime as needed for cough.  115 mL  0  . clonazePAM (KLONOPIN) 0.5 MG tablet Take 0.5 mg by mouth 2 (two) times daily as needed for anxiety.      Marland Kitchen liothyronine (CYTOMEL) 5 MCG tablet Take 5 mcg by mouth daily.       . potassium chloride SA (K-DUR,KLOR-CON) 20 MEQ tablet Take 1 tablet (20 mEq total) by mouth daily. X 5 days  5 tablet  0  . prochlorperazine (COMPAZINE) 10 MG tablet Take 10 mg by mouth every 8 (eight) hours as needed for nausea or vomiting.      Marland Kitchen SYNTHROID 137 MCG tablet Take 137 mcg by mouth daily before breakfast.       . triamterene-hydrochlorothiazide (MAXZIDE-25) 37.5-25 MG per tablet Take 1 tablet by mouth daily.       Marland Kitchen acetaminophen (TYLENOL) 500 MG tablet Take 1,000 mg by mouth every 6 (six) hours as needed for headache.        No current facility-administered medications for this visit.    OBJECTIVE: Middle-aged  white woman in no acute distress  Filed Vitals:   07/15/14 0835  BP: 129/78  Pulse: 95  Temp: 98.2 F (36.8 C)  Resp: 18     Body mass index is 32.37 kg/(m^2).    ECOG FS:1 - Symptomatic but completely ambulatory  Skin: warm, dry  HEENT: sclerae anicteric, conjunctivae pink, oropharynx clear. No thrush or mucositis.  Lymph Nodes: No cervical or supraclavicular lymphadenopathy  Lungs: clear to auscultation bilaterally, no rales, wheezes, or rhonci  Heart: regular rate and rhythm  Abdomen: round, soft, mild tenderness to right upper quadrant, no rebound tenderness, positive bowel sounds throughout Musculoskeletal: No focal spinal tenderness, no peripheral edema  Neuro: non focal, well oriented, positive affect  Breasts: deferred, axillae benign  LAB RESULTS: I  No results found for this basename: SPEP,  UPEP,   kappa and lambda light chains    Lab Results  Component Value Date   WBC 3.1* 07/15/2014   NEUTROABS 1.9 07/15/2014   HGB 11.2* 07/15/2014   HCT 33.8* 07/15/2014   MCV 96.3 07/15/2014   PLT 325 07/15/2014      Chemistry      Component Value Date/Time   NA 141 07/15/2014 0808   NA 140 03/13/2009 2325   K 4.0 07/15/2014 0808   K 3.7 03/13/2009 2325   CL 105 03/13/2009 2325   CO2 24 07/15/2014 0808   CO2 29 03/13/2009 2325   BUN 11.1 07/15/2014 0808   BUN 12 03/13/2009 2325   CREATININE 0.7 07/15/2014 0808   CREATININE 0.76 03/13/2009 2325      Component Value Date/Time   CALCIUM 10.1 07/15/2014 0808   CALCIUM 9.4 03/13/2009 2325   ALKPHOS 66 07/15/2014 0808   ALKPHOS 131* 03/13/2009 2325   AST 34 07/15/2014 0808   AST 136* 03/13/2009 2325   ALT 57* 07/15/2014 0808   ALT 303* 03/13/2009 2325   BILITOT 0.46 07/15/2014 0808   BILITOT 0.5 03/13/2009 2325       No results found for this basename: LABCA2    No components found with this basename: [id number]    No results found for this basename: INR,  in the last 168 hours  Urinalysis    Component Value  Date/Time   COLORURINE YELLOW 03/13/2009 2251   APPEARANCEUR CLOUDY* 03/13/2009 2251   LABSPEC 1.020 06/15/2014 1101   LABSPEC 1.018 03/13/2009 2251   PHURINE 5.5 03/13/2009 2251   GLUCOSEU Negative 06/15/2014 1101   GLUCOSEU NEGATIVE 03/13/2009 2251   HGBUR NEGATIVE 03/13/2009 2251   BILIRUBINUR NEGATIVE 03/13/2009 2251   KETONESUR NEGATIVE 03/13/2009 2251   PROTEINUR NEGATIVE 03/13/2009 2251   UROBILINOGEN 0.2 06/15/2014 1101   UROBILINOGEN 0.2 03/13/2009 2251   NITRITE NEGATIVE 03/13/2009 2251   LEUKOCYTESUR SMALL* 03/13/2009 2251    STUDIES: Ct Abdomen Pelvis W Contrast  06/17/2014   CLINICAL DATA:  Two weeks of worsening right upper quadrant and right flank pain; history of breast malignancy, currently undergoing chemotherapy  EXAM: CT ABDOMEN AND PELVIS WITH CONTRAST  TECHNIQUE: Multidetector CT imaging of the abdomen and pelvis was performed using the standard protocol following bolus administration of intravenous contrast.  CONTRAST:  163mL OMNIPAQUE IOHEXOL 300 MG/ML  SOLN  COMPARISON:  CT scan of the abdomen and pelvis dated March 14, 2009  FINDINGS: The liver exhibits decreased density diffusely. There is subtle hypodensity near the porta hepatis adjacent to the surgical clips which becomes isodense on delayed images compatible with hemangioma. There is no intrahepatic ductal dilation. The pancreas, spleen, adrenal glands, and kidneys exhibit no acute abnormalities. There are 2 stable accessory spleens measuring 1.5 cm in diameter. On delayed images contrast within the renal collecting systems is normal.  The stomach, duodenum, and remainder of the small bowel are normal. There is a 2 cm area of luminal narrowing and mural thickening of the mid transverse colon. This is demonstrated on images 47 through 50 of series 2. It is visible on coronal image #31 and sagittal image # 111. It is not producing significant obstruction. There are a few scattered diverticula in the sigmoid colon.  The urinary  bladder, uterus, and adnexal structures are normal. There is no free pelvic fluid. There is no inguinal nor significant umbilical hernia.  The lumbar spine and bony pelvis are unremarkable.  The lung bases are clear.  IMPRESSION: 1. There is new short segment mid transverse colon luminal narrowing and mural thickening. The possibility of a primary or metastatic malignancy is raised. Direct visualization is recommended. 2. There fatty infiltrative changes of the liver. There are no findings suspicious for metastatic disease to the liver. The gallbladder is surgically absent. 3. There is no acute urinary tract or gynecological abnormality. 4. These results will be called to the ordering clinician or representative by the Radiologist Assistant, and communication documented in the PACS or zVision Dashboard.   Electronically Signed   By: David  Martinique   On: 06/17/2014 16:36    ASSESSMENT: 53 y.o. [place] woman status post left breast biopsy 02/08/2014 for a clinical T1c N0, stage IA invasive ductal carcinoma, grade 3, triple negative, with an MIB-1 of 50%.  (1) status post left lumpectomy and sentinel lymph node sampling 03/01/2014 for a pT1c pN0, stage IA invasive ductal carcinoma, grade 3, with negative margins, repeat prognostic panel again triple negative.  (2) adjuvant chemotherapy started a 03/25/2014 consisting of cyclophosphamide and doxorubicin given in dose dense fashion x4, completed 05/06/2014; followed by weekly paclitaxel x1, on 05/27/2014, poorly tolerated; switched to Abraxane with second dose, starting 06/10/2014, with 11 Abraxane doses planned (dose reduced by 20% because of neutropenia on 07/01/14)  (3) adjuvant radiation to follow chemotherapy  (4) skin rash--resolved  (5) question of sleep apnea  (6) transverse colon lesion noted on CT scan from 06/17/2014 - negative biopsy, also showed fatty liver  PLAN: Maree is tolerating chemo well overall. The labs were reviewed in detail  and were stable. We will proceed with week 4 of abraxane with the 20% dose reduction mentioned in previous progress notes. She will continue naproxen PRN for her joint pain, and I encouraged her to use her nexium to protect her stomach lining.   As for her right upper quadrant pain, the etiology is unclear. Besides the transverse colon lesion that had a negative biopsy, the CT scan on 06/17/14 was clear. She is status post a cholecystectomy. I advised her to follow up with [name].   Analeise will return next week for week 5 of abraxane. She understands and agrees with this plan. She knows the goal of treatment in her case is cure. She has been encouraged to call with any issues that might arise before her next visit here.     07/15/2014 9:08 AM  Marcelino Duster, NP

## 2014-07-15 NOTE — Telephone Encounter (Signed)
, °

## 2014-07-15 NOTE — Patient Instructions (Signed)
[facility] Discharge Instructions for Patients Receiving Chemotherapy  Today you received the following chemotherapy agents abraxane.   To help prevent nausea and vomiting after your treatment, we encourage you to take your nausea medication as directed.   If you develop nausea and vomiting that is not controlled by your nausea medication, call the clinic.   BELOW ARE SYMPTOMS THAT SHOULD BE REPORTED IMMEDIATELY:  *FEVER GREATER THAN 100.5 F  *CHILLS WITH OR WITHOUT FEVER  NAUSEA AND VOMITING THAT IS NOT CONTROLLED WITH YOUR NAUSEA MEDICATION  *UNUSUAL SHORTNESS OF BREATH  *UNUSUAL BRUISING OR BLEEDING  TENDERNESS IN MOUTH AND THROAT WITH OR WITHOUT PRESENCE OF ULCERS  *URINARY PROBLEMS  *BOWEL PROBLEMS  UNUSUAL RASH Items with * indicate a potential emergency and should be followed up as soon as possible.  Feel free to call the clinic you have any questions or concerns. The clinic phone number is [phone].  

## 2014-07-18 ENCOUNTER — Telehealth: Payer: Self-pay | Admitting: *Deleted

## 2014-07-18 ENCOUNTER — Other Ambulatory Visit: Payer: Self-pay | Admitting: Oncology

## 2014-07-18 NOTE — Telephone Encounter (Signed)
Patient called stating that she was having numbness in feet, previously it was coming and going but now has it all the time. States that bottom of feet is numb and also has some tingling "like pins and needles". Reported symptoms to Dr. Jana Hakim. Patient informed that chemo treatment would be cancelled but to keep lab and MD appt this Friday. Patient verbalized understanding.

## 2014-07-22 ENCOUNTER — Other Ambulatory Visit: Payer: 59

## 2014-07-22 ENCOUNTER — Ambulatory Visit (HOSPITAL_BASED_OUTPATIENT_CLINIC_OR_DEPARTMENT_OTHER): Payer: 59 | Admitting: Oncology

## 2014-07-22 ENCOUNTER — Encounter: Payer: Self-pay | Admitting: Oncology

## 2014-07-22 ENCOUNTER — Other Ambulatory Visit (HOSPITAL_BASED_OUTPATIENT_CLINIC_OR_DEPARTMENT_OTHER): Payer: 59

## 2014-07-22 ENCOUNTER — Ambulatory Visit: Payer: 59

## 2014-07-22 VITALS — BP 122/75 | HR 105 | Temp 97.5°F | Resp 17 | Ht 65.0 in | Wt 192.9 lb

## 2014-07-22 DIAGNOSIS — Z171 Estrogen receptor negative status [ER-]: Secondary | ICD-10-CM

## 2014-07-22 DIAGNOSIS — C50212 Malignant neoplasm of upper-inner quadrant of left female breast: Secondary | ICD-10-CM

## 2014-07-22 DIAGNOSIS — M2550 Pain in unspecified joint: Secondary | ICD-10-CM

## 2014-07-22 DIAGNOSIS — G4700 Insomnia, unspecified: Secondary | ICD-10-CM

## 2014-07-22 LAB — COMPREHENSIVE METABOLIC PANEL (CC13)
ALBUMIN: 4.4 g/dL (ref 3.5–5.0)
ALT: 62 U/L — ABNORMAL HIGH (ref 0–55)
ANION GAP: 14 meq/L — AB (ref 3–11)
AST: 31 U/L (ref 5–34)
Alkaline Phosphatase: 72 U/L (ref 40–150)
BILIRUBIN TOTAL: 0.36 mg/dL (ref 0.20–1.20)
BUN: 15.2 mg/dL (ref 7.0–26.0)
CO2: 23 meq/L (ref 22–29)
Calcium: 10.6 mg/dL — ABNORMAL HIGH (ref 8.4–10.4)
Chloride: 104 mEq/L (ref 98–109)
Creatinine: 0.8 mg/dL (ref 0.6–1.1)
GLUCOSE: 148 mg/dL — AB (ref 70–140)
POTASSIUM: 3.4 meq/L — AB (ref 3.5–5.1)
Sodium: 141 mEq/L (ref 136–145)
TOTAL PROTEIN: 7.4 g/dL (ref 6.4–8.3)

## 2014-07-22 LAB — CBC WITH DIFFERENTIAL/PLATELET
BASO%: 1.5 % (ref 0.0–2.0)
BASOS ABS: 0.1 10*3/uL (ref 0.0–0.1)
EOS ABS: 0.2 10*3/uL (ref 0.0–0.5)
EOS%: 4.9 % (ref 0.0–7.0)
HCT: 36.2 % (ref 34.8–46.6)
HEMOGLOBIN: 12 g/dL (ref 11.6–15.9)
LYMPH%: 30.9 % (ref 14.0–49.7)
MCH: 32 pg (ref 25.1–34.0)
MCHC: 33.1 g/dL (ref 31.5–36.0)
MCV: 96.5 fL (ref 79.5–101.0)
MONO#: 0.2 10*3/uL (ref 0.1–0.9)
MONO%: 6.5 % (ref 0.0–14.0)
NEUT#: 1.8 10*3/uL (ref 1.5–6.5)
NEUT%: 56.2 % (ref 38.4–76.8)
PLATELETS: 423 10*3/uL — AB (ref 145–400)
RBC: 3.75 10*6/uL (ref 3.70–5.45)
RDW: 14.3 % (ref 11.2–14.5)
WBC: 3.2 10*3/uL — ABNORMAL LOW (ref 3.9–10.3)
lymph#: 1 10*3/uL (ref 0.9–3.3)

## 2014-07-22 LAB — TECHNOLOGIST REVIEW

## 2014-07-22 MED ORDER — ZOLPIDEM TARTRATE 5 MG PO TABS: 5.0000 mg | ORAL_TABLET | Freq: Every evening | ORAL | Status: DC | PRN

## 2014-07-22 NOTE — Progress Notes (Signed)
[place]  [phone] [phone]     ID: Alexandria Bradley OB: 1960-12-04  MR#: [id number]  GBT#:[id number]  PCP: Lovenia Kim, MD GYN:   SU: [name] OTHER MD: [name], Delrae Rend, Wilford Corner  CHIEF COMPLAINT: Triple negative breast cancer  CURRENT TREATMENT: Adjuvant chemotherapy  BREAST CANCER HISTORY: From the original intake note of 02/16/2014:  [name] palpated a mass in her left breast early May and brought it immediately to her gynecologist attention. He set her up for bilateral diagnostic mammography and left ultrasonography at [facility] 02/07/2014. Mammography showed a 1.3 cm oval mass with spiculated margins in the left breast at the 11:00 position. This was palpable. Ultrasound showed a 1.1 cm tall her than wide mass with a microlobulated margins. He was hypoechoic. There were no abnormalities noted in the left axilla.  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 Lyndon Code at conference 02/16/2014), triple negative, with an MIB-1 of 50%.  The patient's subsequent history is as detailed below  INTERVAL HISTORY: Shermika returns today for follow up of her breast cancer, accompanied by her husband. Today is week 5 of 11 weekly cycles of abraxane. She contacted our office earlier this week due to increased neuropathy. Her feet were numb at the time with unsteady gait. No falls. Reports neuropathy is better now. Only her toes are numb. She has ongoing joint pain after chemo. She took naproxen once, and it was helpful. However, she is using the drug sparingly for fear of stomach ulcers. She is not sleeping well despite trying her Klonopin.  REVIEW OF SYSTEMS: Nabila denies fever or chills. She has some mild nausea, relieved with compazine. Her stools have become more formed and regular. She continues to  deal with her upper respiratory sinus issues, symptomatically. Her right sided upper quadrant pain continues and is more persistent this week. Her appetite is healthy, she is staying hydrated. Neuropathy as noted above. She has no shortness of breath, palpitations, cough, or fatigue. A detailed review of systems is otherwise noncontributory.    PAST MEDICAL HISTORY: Past Medical History  Diagnosis Date  . Thyroid disease   . Osteoporosis   . Psoriasis   . Breast cancer   . Arthritis   . Back pain   . Hypothyroidism   . Anxiety   . Breast cancer     left  . Hypertension   . PONV (postoperative nausea and vomiting)   . Hypercholesterolemia   . PVC's (premature ventricular contractions)   . Kidney stones   . Headache(784.0)     PMH : migraines    PAST SURGICAL HISTORY: Past Surgical History  Procedure Laterality Date  . Cholecystectomy    . Tonsillectomy    . Tubal ligation    . Myomectomy    . Lipoma excision    . Portacath placement N/A 03/01/2014    Procedure: INSERTION PORT-A-CATH;  Surgeon: [name], MD;  Location: [place];  Service: General;  Laterality: N/A;  . Colonoscopy N/A 06/27/2014    Procedure: COLONOSCOPY;  Surgeon: Lear Ng, MD;  Location: [place];  Service: Endoscopy;  Laterality: N/A;    FAMILY HISTORY Family History  Problem Relation Age of Onset  . Endometrial cancer Mother   . Lung cancer Father   . Brain cancer Father    the patient's father died at the age of 58  from what may have been metastatic lung cancer. The patient knows very little about the father's side of her family. Her mother is alive at age 41. She was recently diagnosed with endometrial cancer. The patient has one brother, 2 sisters. There is no history of breast or ovarian cancer in the family.  GYNECOLOGIC HISTORY:  Menarche age 52, first live birth age 66. The patient is GX P3. One child died shortly after birth. She stopped having periods  approximately 2005. She did not use hormone replacement. 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 Her husband [name] is a Insurance underwriter. Son [name] is that she Nurse, adult in [place]. Daughter [name] lives in [place] and works for [employer] as an Therapist, sports.    ADVANCED DIRECTIVES: Not in place   HEALTH MAINTENANCE: History  Substance Use Topics  . Smoking status: Former Smoker    Types: Cigarettes  . Smokeless tobacco: Never Used     Comment: Quit smoking cigarettes in 2002  . Alcohol Use: Yes     Comment: social     Colonoscopy: 2010  PAP: 2013  Bone density: 05/15/2009, at [place], normal, lowest T score -0.8  Lipid panel:  Allergies  Allergen Reactions  . Ciprofloxacin Anxiety and Rash  . Prednisone Shortness Of Breath and Other (See Comments)    "jacks me up" jittery  . Codeine Nausea Only and Other (See Comments)    Nausea   . Cortisone Swelling and Other (See Comments)    "jack me up"    Current Outpatient Prescriptions  Medication Sig Dispense Refill  . acetaminophen (TYLENOL) 500 MG tablet Take 1,000 mg by mouth every 6 (six) hours as needed for headache.       Marland Kitchen amLODipine (NORVASC) 5 MG tablet Take 5 mg by mouth daily.       . chlorpheniramine-HYDROcodone (TUSSIONEX) 10-8 MG/5ML LQCR Take 5 mLs by mouth at bedtime as needed for cough.  115 mL  0  . clonazePAM (KLONOPIN) 0.5 MG tablet Take 0.5 mg by mouth 2 (two) times daily as needed for anxiety.      . lidocaine-prilocaine (EMLA) cream Apply 1 application topically as needed.  30 g  1  . liothyronine (CYTOMEL) 5 MCG tablet Take 5 mcg by mouth daily.       . potassium chloride SA (K-DUR,KLOR-CON) 20 MEQ tablet Take 1 tablet (20 mEq total) by mouth daily. X 5 days  5 tablet  0  . prochlorperazine (COMPAZINE) 10 MG tablet Take 10 mg by mouth every 8 (eight) hours as needed for  nausea or vomiting.      Marland Kitchen SYNTHROID 137 MCG tablet Take 137 mcg by mouth daily before breakfast.       . triamterene-hydrochlorothiazide (MAXZIDE-25) 37.5-25 MG per tablet Take 1 tablet by mouth daily.       Marland Kitchen zolpidem (AMBIEN) 5 MG tablet Take 1 tablet (5 mg total) by mouth at bedtime as needed for sleep.  30 tablet  0   No current facility-administered medications for this visit.    OBJECTIVE: Middle-aged white woman in no acute distress  Filed Vitals:   07/22/14 0907  BP: 122/75  Pulse: 105  Temp: 97.5 F (36.4 C)  Resp: 17     Body mass index is 32.1 kg/(m^2).    ECOG FS:1 - Symptomatic but completely ambulatory  Skin: warm, dry  HEENT: sclerae anicteric, conjunctivae pink, oropharynx clear. No thrush or mucositis.  Lymph Nodes: No cervical or supraclavicular lymphadenopathy  Lungs: clear to auscultation bilaterally, no rales, wheezes, or rhonci  Heart: regular rate and rhythm  Abdomen: round, soft, mild tenderness to right upper quadrant, no rebound tenderness, positive bowel sounds throughout Musculoskeletal: No focal spinal tenderness, no peripheral edema  Neuro: non focal, well oriented, positive affect  Breasts: deferred, axillae benign  LAB RESULTS: I No results found for this basename: SPEP,  UPEP,   kappa and lambda light chains    Lab Results  Component Value Date   WBC 3.2* 07/22/2014   NEUTROABS 1.8 07/22/2014   HGB 12.0 07/22/2014   HCT 36.2 07/22/2014   MCV 96.5 07/22/2014   PLT 423* 07/22/2014      Chemistry      Component Value Date/Time   NA 141 07/22/2014 0855   NA 140 03/13/2009 2325   K 3.4* 07/22/2014 0855   K 3.7 03/13/2009 2325   CL 105 03/13/2009 2325   CO2 23 07/22/2014 0855   CO2 29 03/13/2009 2325   BUN 15.2 07/22/2014 0855   BUN 12 03/13/2009 2325   CREATININE 0.8 07/22/2014 0855   CREATININE 0.76 03/13/2009 2325      Component Value Date/Time   CALCIUM 10.6* 07/22/2014 0855   CALCIUM 9.4 03/13/2009 2325   ALKPHOS 72 07/22/2014  0855   ALKPHOS 131* 03/13/2009 2325   AST 31 07/22/2014 0855   AST 136* 03/13/2009 2325   ALT 62* 07/22/2014 0855   ALT 303* 03/13/2009 2325   BILITOT 0.36 07/22/2014 0855   BILITOT 0.5 03/13/2009 2325       No results found for this basename: LABCA2    No components found with this basename: EXBMW413    No results found for this basename: INR,  in the last 168 hours  Urinalysis    Component Value Date/Time   COLORURINE YELLOW 03/13/2009 2251   APPEARANCEUR CLOUDY* 03/13/2009 2251   LABSPEC 1.020 06/15/2014 1101   LABSPEC 1.018 03/13/2009 2251   PHURINE 5.5 03/13/2009 2251   GLUCOSEU Negative 06/15/2014 1101   GLUCOSEU NEGATIVE 03/13/2009 2251   HGBUR NEGATIVE 03/13/2009 [place] 03/13/2009 2251   KETONESUR NEGATIVE 03/13/2009 2251   PROTEINUR NEGATIVE 03/13/2009 2251   UROBILINOGEN 0.2 06/15/2014 1101   UROBILINOGEN 0.2 03/13/2009 2251   NITRITE NEGATIVE 03/13/2009 2251   LEUKOCYTESUR SMALL* 03/13/2009 2251    STUDIES: Ct Abdomen Pelvis W Contrast  06/17/2014   CLINICAL DATA:  Two weeks of worsening right upper quadrant and right flank pain; history of breast malignancy, currently undergoing chemotherapy  EXAM: CT ABDOMEN AND PELVIS WITH CONTRAST  TECHNIQUE: Multidetector CT imaging of the abdomen and pelvis was performed using the standard protocol following bolus administration of intravenous contrast.  CONTRAST:  138mL OMNIPAQUE IOHEXOL 300 MG/ML  SOLN  COMPARISON:  CT scan of the abdomen and pelvis dated March 14, 2009  FINDINGS: The liver exhibits decreased density diffusely. There is subtle hypodensity near the porta hepatis adjacent to the surgical clips which becomes isodense on delayed images compatible with hemangioma. There is no intrahepatic ductal dilation. The pancreas, spleen, adrenal glands, and kidneys exhibit no acute abnormalities. There are 2 stable accessory spleens measuring 1.5 cm in diameter. On delayed images contrast within the renal collecting  systems is normal.  The stomach, duodenum, and remainder of the small bowel are normal. There is a 2 cm area of luminal narrowing and mural  thickening of the mid transverse colon. This is demonstrated on images 47 through 50 of series 2. It is visible on coronal image #31 and sagittal image # 111. It is not producing significant obstruction. There are a few scattered diverticula in the sigmoid colon.  The urinary bladder, uterus, and adnexal structures are normal. There is no free pelvic fluid. There is no inguinal nor significant umbilical hernia.  The lumbar spine and bony pelvis are unremarkable. The lung bases are clear.  IMPRESSION: 1. There is new short segment mid transverse colon luminal narrowing and mural thickening. The possibility of a primary or metastatic malignancy is raised. Direct visualization is recommended. 2. There fatty infiltrative changes of the liver. There are no findings suspicious for metastatic disease to the liver. The gallbladder is surgically absent. 3. There is no acute urinary tract or gynecological abnormality. 4. These results will be called to the ordering clinician or representative by the Radiologist Assistant, and communication documented in the PACS or zVision Dashboard.   Electronically Signed   By: David  Martinique   On: 06/17/2014 16:36    ASSESSMENT: 54 y.o. [place] woman status post left breast biopsy 02/08/2014 for a clinical T1c N0, stage IA invasive ductal carcinoma, grade 3, triple negative, with an MIB-1 of 50%.  (1) status post left lumpectomy and sentinel lymph node sampling 03/01/2014 for a pT1c pN0, stage IA invasive ductal carcinoma, grade 3, with negative margins, repeat prognostic panel again triple negative.  (2) adjuvant chemotherapy started a 03/25/2014 consisting of cyclophosphamide and doxorubicin given in dose dense fashion x4, completed 05/06/2014; followed by weekly paclitaxel x1, on 05/27/2014, poorly tolerated; switched to Abraxane with  second dose, starting 06/10/2014, with 11 Abraxane doses planned (dose reduced by 20% because of neutropenia on 07/01/14)  (3) adjuvant radiation to follow chemotherapy  (4) skin rash--resolved  (5) question of sleep apnea  (6) transverse colon lesion noted on CT scan from 06/17/2014 - negative biopsy, also showed fatty liver  PLAN: Aslan is tolerating chemo well overall. The labs were reviewed in detail and were stable. Per telephone note from 07/18/14, her chemo is to be held this week. The patient was not clear as to whether chemo will be held this week only or if her chemo is done. Will review with Dr. Jana Hakim upon his return on Monday. She will keep her appointment next week to determine further plan of care. She will continue naproxen PRN for her joint pain, and I encouraged her to use her nexium to protect her stomach lining.   For her insomnia, she may use Ambien. I have given her specific instructions not to use this medication in conjunction with her Klonopin or Ativan.   Lalena will return next week to determine a further treatment plan. She understands and agrees with this plan. She knows the goal of treatment in her case is cure. She has been encouraged to call with any issues that might arise before her next visit here.     07/22/2014 10:32 AM  Mikey Bussing, NP

## 2014-07-23 ENCOUNTER — Other Ambulatory Visit: Payer: Self-pay | Admitting: Oncology

## 2014-07-23 DIAGNOSIS — C50912 Malignant neoplasm of unspecified site of left female breast: Secondary | ICD-10-CM

## 2014-07-23 NOTE — Progress Notes (Unsigned)
I called Letonia and discussed her PN symptoms. Though thankfull=y they have improved, they have not completely cleared. I think if we keep pushing the taxanes we are just going to make her an invalid and we are stopping chemo now. She understands the difference if any from missing the last few doses of Abraxane is likely in the 1% range.  Accordingly we are cancelling her upcoming visitn=s and treatments and setting her up for Rad Onc. She will see me in 6-8 weeks to discuss follow-up.

## 2014-07-25 ENCOUNTER — Telehealth: Payer: Self-pay | Admitting: Oncology

## 2014-07-25 NOTE — Telephone Encounter (Signed)
Confirm appt d/t for Nov/Dec. Mailed cal.

## 2014-07-29 ENCOUNTER — Ambulatory Visit: Payer: 59

## 2014-07-29 ENCOUNTER — Other Ambulatory Visit: Payer: 59

## 2014-07-29 ENCOUNTER — Ambulatory Visit: Payer: 59 | Admitting: Nurse Practitioner

## 2014-08-03 NOTE — Progress Notes (Signed)
Breast cancer of left upper-inner quadrant.  Histology per Pathology Report:   Receptor Status: ER(0%), PR (0%), Her2-neu ( No Amplification)  Did patient present with symptoms (if so, please note symptoms) or was this found on screening mammography?:   Past/Anticipated interventions by surgeon, if any:03/01/14   Past/Anticipated interventions by medical oncology, if any: Chemotherapy  Lymphedema issues, if any: None  Pain issues, if any: Occassional shooting pains in her left breast starting in the past week.  SAFETY ISSUES:  Prior radiation?No   Pacemaker/ICD? NO  Possible current pregnancy?NO  Is the patient on methotrexate?NO   Current Complaints / other details:Married. Menses age 58, GXP90.first live birth age 14. last period in 2005. BC x 6 months, No hormone replacement    Arlyss Repress, RN 08/03/2014,4:37 PM

## 2014-08-04 ENCOUNTER — Ambulatory Visit
Admission: RE | Admit: 2014-08-04 | Discharge: 2014-08-04 | Disposition: A | Discharge status: Home / Self Care | Payer: 59 | Source: Ambulatory Visit | Attending: Radiation Oncology | Admitting: Radiation Oncology

## 2014-08-04 ENCOUNTER — Encounter: Payer: Self-pay | Admitting: Radiation Oncology

## 2014-08-04 VITALS — BP 113/76 | HR 90 | Temp 98.0°F | Ht 65.0 in | Wt 192.2 lb

## 2014-08-04 DIAGNOSIS — Z510 Encounter for antineoplastic radiation therapy: Secondary | ICD-10-CM | POA: Insufficient documentation

## 2014-08-04 DIAGNOSIS — C50212 Malignant neoplasm of upper-inner quadrant of left female breast: Secondary | ICD-10-CM | POA: Insufficient documentation

## 2014-08-04 DIAGNOSIS — G629 Polyneuropathy, unspecified: Secondary | ICD-10-CM | POA: Diagnosis not present

## 2014-08-04 DIAGNOSIS — Z171 Estrogen receptor negative status [ER-]: Secondary | ICD-10-CM | POA: Insufficient documentation

## 2014-08-04 DIAGNOSIS — Z9221 Personal history of antineoplastic chemotherapy: Secondary | ICD-10-CM | POA: Insufficient documentation

## 2014-08-05 ENCOUNTER — Ambulatory Visit: Payer: 59

## 2014-08-05 ENCOUNTER — Ambulatory Visit: Payer: 59 | Admitting: Nurse Practitioner

## 2014-08-05 ENCOUNTER — Other Ambulatory Visit: Payer: 59

## 2014-08-05 ENCOUNTER — Encounter: Payer: Self-pay | Admitting: Radiation Oncology

## 2014-08-05 NOTE — Progress Notes (Signed)
   Department of Radiation Oncology  Phone:  [phone] Fax:        [phone]   Name: Alexandria Bradley MRN: [id number]  DOB: [birth year]  Date: 08/04/2014  Follow Up Visit Note  Diagnosis: Breast cancer of upper-inner quadrant of left female breast   Staging form: Breast, AJCC 7th Edition     Clinical: Stage IA (T1c, N0, cM0) - Unsigned       Staging comments: Staged at breast conference 5.20.15  Interval History: [name] presents today for routine followup.  She had her lumpectomy in June which revealed a 1.2 cm triple negative invasive ductal carcinoma.  She has completed chemotherapy which has been complicated by significant neuropathy. She had 4 cycles of AC and then Taxol and Abraxane.  She is recovering from chemo and has gone back o work.  She is ready to begin radiation.   Physical Exam:  Filed Vitals:   08/04/14 1003  BP: 113/76  Pulse: 90  Temp: 98 F (36.7 C)  Height: 5\' 5"  (1.651 m)  Weight: 192 lb 3.2 oz (87.181 kg)   Pleasant female. No distress. Alert and oriented.   IMPRESSION: Alexandria Bradley is a 53 y.o. female s/p breast conservation and adjuvant chemotherapy  PLAN:  I spoke to the patient today regarding her diagnosis and options for treatment. . We discussed the role of radiation in decreasing local failures in patients who undergo lumpectomy. We discussed the process of simulation and the placement tattoos. We discussed 6 weeks of treatment as an outpatient. We discussed the possibility of asymptomatic lung damage. We discussed the low likelihood of secondary malignancies. We discussed the possible side effects including but not limited to skin redness, fatigue, permanent skin darkening, and breast swelling. We discussed the use of breath hold technique for cardiac sparing if necessary.     [name], MD

## 2014-08-09 ENCOUNTER — Ambulatory Visit
Admission: RE | Admit: 2014-08-09 | Discharge: 2014-08-09 | Disposition: A | Discharge status: Home / Self Care | Payer: 59 | Source: Ambulatory Visit | Attending: Radiation Oncology | Admitting: Radiation Oncology

## 2014-08-09 DIAGNOSIS — Z510 Encounter for antineoplastic radiation therapy: Secondary | ICD-10-CM | POA: Diagnosis not present

## 2014-08-09 DIAGNOSIS — C50212 Malignant neoplasm of upper-inner quadrant of left female breast: Secondary | ICD-10-CM

## 2014-08-09 NOTE — Progress Notes (Signed)
Name: Alexandria Bradley   MRN: [id number]  Date:  08/09/2014  DOB: [birth year]  Status:outpatient   DIAGNOSIS: Left Breast cancer.  CONSENT VERIFIED: yes SET UP: Patient is setup supine  IMMOBILIZATION:  The following immobilization was used:Custom Moldable Pillow, breast board.  NARRATIVE: [name] was brought to the [facility].  Identity was confirmed.  All relevant records and images related to the planned course of therapy were reviewed.  Then, the patient was positioned in a stable reproducible clinical set-up for radiation therapy.  Wires were placed to delineate the clinical extent of breast tissue. A wire was placed on the scar as well.  CT images were obtained.  An isocenter was placed. Skin markings were placed.  The position of the heart was then analyzed.  Due to the proximity of the heart to the chest wall, I felt she would benefit from deep inspiration breath hold for cardiac sparing.  She was then coached and rescanned in the breath hold position.  Acceptable cardiac sparing was achieved. The CT images were loaded into the planning software where the target and avoidance structures were contoured.  The radiation prescription was entered and confirmed. The patient was discharged in stable condition and tolerated simulation well.    TREATMENT PLANNING NOTE/3D Simulation Note Treatment planning then occurred. I have requested : MLC's, isodose plan, basic dose calculation  3D simulation was performed.  I personally designed and supervised the construction of 3 medically necessary complex treatment devices in the form of MLCs which will be used for beam modification and to protect critical structures including the heart and lung as well as the immobilization device which is necessary for reproducible set up.  I have requested a dose volume histogram of the heart, lung and tumor cavity.    RESPIRATORY MOTION MANAGEMENT SIMULATION - Deep Inspiration Breath  Hold  NARRATIVE:  In order to account for effect of respiratory motion on target structures and other organs in the planning and delivery of radiotherapy, this patient underwent respiratory motion management simulation.  To accomplish this, when the patient was brought to the CT simulation planning suite, a bellows was placed on the her abdomen.  Wave forms of the patient's breathing were obtained. Coaching was performed and practice sessions initiated to monitor her ability to obtain and maintain deep inspiration breath hold.  The CT images were loaded into the planning software and fused with her free breathing images by physics.  Acceptable cardiac sparing was achieved through the use of deep inspiration breath hold.  Planning will be performed on her breath hold scan

## 2014-08-09 NOTE — Progress Notes (Signed)
Radiation Oncology         ([phone]) [phone] ________________________________  Name: Alexandria Bradley      MRN: [id number]          Date: 08/09/2014              DOB: [birth year]  Optical Surface Tracking Plan:  Since intensity modulated radiotherapy (IMRT) and 3D conformal radiation treatment methods are predicated on accurate and precise positioning for treatment, intrafraction motion monitoring is medically necessary to ensure accurate and safe treatment delivery.  The ability to quantify intrafraction motion without excessive ionizing radiation dose can only be performed with optical surface tracking. Accordingly, surface imaging offers the opportunity to obtain 3D measurements of patient position throughout IMRT and 3D treatments without excessive radiation exposure.  I am ordering optical surface tracking for this patient's upcoming course of radiotherapy. ________________________________ Signature   Reference:   Ursula Alert, J, et al. Surface imaging-based analysis of intrafraction motion for breast radiotherapy patients.Journal of [place], n. 6, nov. 2014. ISSN [id number].   Available at: <http://www.jacmp.org/index.php/jacmp/article/view/4957>.

## 2014-08-10 DIAGNOSIS — Z510 Encounter for antineoplastic radiation therapy: Secondary | ICD-10-CM | POA: Diagnosis not present

## 2014-08-12 ENCOUNTER — Other Ambulatory Visit: Payer: 59

## 2014-08-12 ENCOUNTER — Ambulatory Visit: Payer: 59

## 2014-08-12 ENCOUNTER — Ambulatory Visit: Payer: 59 | Admitting: Nurse Practitioner

## 2014-08-16 ENCOUNTER — Encounter: Payer: Self-pay | Admitting: Radiation Oncology

## 2014-08-16 ENCOUNTER — Ambulatory Visit: Payer: 59 | Admitting: Radiation Oncology

## 2014-08-17 ENCOUNTER — Ambulatory Visit: Payer: 59 | Admitting: Radiation Oncology

## 2014-08-18 ENCOUNTER — Ambulatory Visit
Admission: RE | Admit: 2014-08-18 | Discharge: 2014-08-18 | Disposition: A | Discharge status: Home / Self Care | Payer: 59 | Source: Ambulatory Visit | Attending: Radiation Oncology | Admitting: Radiation Oncology

## 2014-08-18 ENCOUNTER — Ambulatory Visit: Payer: 59

## 2014-08-18 DIAGNOSIS — C50212 Malignant neoplasm of upper-inner quadrant of left female breast: Secondary | ICD-10-CM

## 2014-08-18 DIAGNOSIS — Z510 Encounter for antineoplastic radiation therapy: Secondary | ICD-10-CM | POA: Diagnosis not present

## 2014-08-18 MED ORDER — ALRA NON-METALLIC DEODORANT (RAD-ONC)
1.0000 "application " | Freq: Once | TOPICAL | Status: AC
  Administered 2014-08-18: 1 via TOPICAL

## 2014-08-18 MED ORDER — RADIAPLEXRX EX GEL
Freq: Once | CUTANEOUS | Status: AC
  Administered 2014-08-18: 09:00:00 via TOPICAL

## 2014-08-18 NOTE — Progress Notes (Signed)
Patient education done, radiation therapy and you book, Val Malloy,RN business card, skin product flyer, alra deodorant,radiaplex gel given to p[atient, discussed fatigue, skin irritation,pain, not to use razor on left axilla,  Increase protein in diet, stay hydrated, drink plenty fluids, use rdiaplex after treatment and at bedtime, alra after rad tx and prn, nothing to breast/axilla area 4 hour prior to rad txs, all questions answered,teach back given 9:10 AM

## 2014-08-18 NOTE — Progress Notes (Signed)
  Radiation Oncology         ([phone]) [phone] ________________________________  Name: Alexandria Bradley MRN: [id number]  Date: 08/18/2014  DOB: 01-Apr-1961  Simulation Verification Note  Status: outpatient  NARRATIVE: The patient was brought to the treatment unit and placed in the planned treatment position. The clinical setup was verified. Then port films were obtained and uploaded to the radiation oncology medical record software.  The treatment beams were carefully compared against the planned radiation fields. The position location and shape of the radiation fields was reviewed. The targeted volume of tissue appears appropriately covered by the radiation beams. Organs at risk appear to be excluded as planned.  Based on my personal review, I approved the simulation verification. The patient's treatment will proceed as planned.  ------------------------------------------------  [name], MD

## 2014-08-19 ENCOUNTER — Other Ambulatory Visit: Payer: 59

## 2014-08-19 ENCOUNTER — Ambulatory Visit
Admission: RE | Admit: 2014-08-19 | Discharge: 2014-08-19 | Disposition: A | Discharge status: Home / Self Care | Payer: 59 | Source: Ambulatory Visit | Attending: Radiation Oncology | Admitting: Radiation Oncology

## 2014-08-19 ENCOUNTER — Ambulatory Visit: Payer: 59 | Admitting: Nurse Practitioner

## 2014-08-19 DIAGNOSIS — Z510 Encounter for antineoplastic radiation therapy: Secondary | ICD-10-CM | POA: Diagnosis not present

## 2014-08-21 ENCOUNTER — Ambulatory Visit
Admission: RE | Admit: 2014-08-21 | Discharge: 2014-08-21 | Disposition: A | Discharge status: Home / Self Care | Payer: 59 | Source: Ambulatory Visit | Attending: Radiation Oncology | Admitting: Radiation Oncology

## 2014-08-21 DIAGNOSIS — Z510 Encounter for antineoplastic radiation therapy: Secondary | ICD-10-CM | POA: Diagnosis not present

## 2014-08-22 ENCOUNTER — Ambulatory Visit
Admission: RE | Admit: 2014-08-22 | Discharge: 2014-08-22 | Disposition: A | Discharge status: Home / Self Care | Payer: 59 | Source: Ambulatory Visit | Attending: Radiation Oncology | Admitting: Radiation Oncology

## 2014-08-22 DIAGNOSIS — Z510 Encounter for antineoplastic radiation therapy: Secondary | ICD-10-CM | POA: Diagnosis not present

## 2014-08-23 ENCOUNTER — Ambulatory Visit
Admission: RE | Admit: 2014-08-23 | Discharge: 2014-08-23 | Disposition: A | Discharge status: Home / Self Care | Payer: 59 | Source: Ambulatory Visit | Attending: Radiation Oncology | Admitting: Radiation Oncology

## 2014-08-23 VITALS — BP 121/76 | HR 89 | Temp 98.0°F | Wt 192.7 lb

## 2014-08-23 DIAGNOSIS — C50212 Malignant neoplasm of upper-inner quadrant of left female breast: Secondary | ICD-10-CM

## 2014-08-23 DIAGNOSIS — Z510 Encounter for antineoplastic radiation therapy: Secondary | ICD-10-CM | POA: Diagnosis not present

## 2014-08-23 NOTE — Progress Notes (Signed)
Weekly Management Note Current Dose: 9 Gy  Projected Dose: 61 Gy   Narrative:  The patient presents for routine under treatment assessment.  CBCT/MVCT images/Port film x-rays were reviewed.  The chart was checked. Doing well. No complaints  Physical Findings: Weight: 192 lb 11.2 oz (87.408 kg). Unchanged  Impression:  The patient is tolerating radiation.  Plan:  Continue treatment as planned. Continue radiaplex.

## 2014-08-23 NOTE — Progress Notes (Signed)
Weekly assessment of radiation to left [employer]ffic.ch 5 of 25 fractions.Denies pain.Still overcoming fatigue from chemotherapy.No skin changes at this point.Contine application of radiaplex twice daily.

## 2014-08-24 ENCOUNTER — Ambulatory Visit
Admission: RE | Admit: 2014-08-24 | Discharge: 2014-08-24 | Disposition: A | Discharge status: Home / Self Care | Payer: 59 | Source: Ambulatory Visit | Attending: Radiation Oncology | Admitting: Radiation Oncology

## 2014-08-24 DIAGNOSIS — Z510 Encounter for antineoplastic radiation therapy: Secondary | ICD-10-CM | POA: Diagnosis not present

## 2014-08-26 ENCOUNTER — Ambulatory Visit: Payer: 59

## 2014-08-29 ENCOUNTER — Ambulatory Visit
Admission: RE | Admit: 2014-08-29 | Discharge: 2014-08-29 | Disposition: A | Discharge status: Home / Self Care | Payer: 59 | Source: Ambulatory Visit | Attending: Radiation Oncology | Admitting: Radiation Oncology

## 2014-08-29 DIAGNOSIS — Z510 Encounter for antineoplastic radiation therapy: Secondary | ICD-10-CM | POA: Diagnosis not present

## 2014-08-30 ENCOUNTER — Encounter: Payer: Self-pay | Admitting: Radiation Oncology

## 2014-08-30 ENCOUNTER — Ambulatory Visit
Admission: RE | Admit: 2014-08-30 | Discharge: 2014-08-30 | Disposition: A | Discharge status: Home / Self Care | Payer: 59 | Source: Ambulatory Visit | Attending: Radiation Oncology | Admitting: Radiation Oncology

## 2014-08-30 ENCOUNTER — Other Ambulatory Visit: Payer: Self-pay | Admitting: Oncology

## 2014-08-30 VITALS — BP 131/95 | HR 85 | Temp 97.9°F | Resp 20 | Wt 193.7 lb

## 2014-08-30 DIAGNOSIS — Z510 Encounter for antineoplastic radiation therapy: Secondary | ICD-10-CM | POA: Diagnosis not present

## 2014-08-30 DIAGNOSIS — C50212 Malignant neoplasm of upper-inner quadrant of left female breast: Secondary | ICD-10-CM

## 2014-08-30 NOTE — Progress Notes (Signed)
Weekly Management Note Current Dose:  14.4 Gy  Projected Dose: 61 Gy   Narrative:  Alexandria Bradley presents for routine under treatment assessment.  CBCT/MVCT images/Port film x-rays were reviewed.  Alexandria chart was checked. Diarrhea x 1 week. No fevers, chills or bleeding. No antibiotic use recently. Some right side cramping that she had during chemo. Immodium dosen't work so she is just "suffering" as she did during chemo. No dietary modifications made yet. "just try to drink more water". Relating to radiation "sore" left axilla.   Physical Findings: Weight: 193 lb 11.2 oz (87.862 kg). Unchanged.   Impression:  Alexandria Bradley is tolerating radiation.  Plan:  Continue treatment as planned. Continue radiaplex. Call med onc re: diarrhea. Will give hand out on low fiber diet.

## 2014-08-30 NOTE — Progress Notes (Addendum)
Patient reports diarrhea daily x 1 for past week, nausea. She denies vomiting, headache, sore throat, other symptoms. She has not taken Imodium; states "It doesn't work for me". She has not sought medical attention for this. Suggested she may need to contact her PCP. She states she has upcoming appointment with PCP for BP check. She is fatigued. She is applying Radiaplex to left breast treatment area; no skin changes reported. She states her left axilla is "sore".

## 2014-08-31 ENCOUNTER — Ambulatory Visit
Admission: RE | Admit: 2014-08-31 | Discharge: 2014-08-31 | Disposition: A | Discharge status: Home / Self Care | Payer: 59 | Source: Ambulatory Visit | Attending: Radiation Oncology | Admitting: Radiation Oncology

## 2014-08-31 DIAGNOSIS — Z510 Encounter for antineoplastic radiation therapy: Secondary | ICD-10-CM | POA: Diagnosis not present

## 2014-09-01 ENCOUNTER — Other Ambulatory Visit: Payer: Self-pay

## 2014-09-01 ENCOUNTER — Ambulatory Visit
Admission: RE | Admit: 2014-09-01 | Discharge: 2014-09-01 | Disposition: A | Discharge status: Home / Self Care | Payer: 59 | Source: Ambulatory Visit | Attending: Radiation Oncology | Admitting: Radiation Oncology

## 2014-09-01 DIAGNOSIS — Z510 Encounter for antineoplastic radiation therapy: Secondary | ICD-10-CM | POA: Diagnosis not present

## 2014-09-01 DIAGNOSIS — C50212 Malignant neoplasm of upper-inner quadrant of left female breast: Secondary | ICD-10-CM

## 2014-09-01 MED ORDER — ZOLPIDEM TARTRATE 5 MG PO TABS: 5.0000 mg | ORAL_TABLET | Freq: Every evening | ORAL | Status: DC | PRN

## 2014-09-02 ENCOUNTER — Ambulatory Visit: Payer: 59 | Admitting: Nurse Practitioner

## 2014-09-02 ENCOUNTER — Ambulatory Visit
Admission: RE | Admit: 2014-09-02 | Discharge: 2014-09-02 | Disposition: A | Discharge status: Home / Self Care | Payer: 59 | Source: Ambulatory Visit | Attending: Radiation Oncology | Admitting: Radiation Oncology

## 2014-09-02 ENCOUNTER — Other Ambulatory Visit: Payer: 59

## 2014-09-02 DIAGNOSIS — Z510 Encounter for antineoplastic radiation therapy: Secondary | ICD-10-CM | POA: Diagnosis not present

## 2014-09-05 ENCOUNTER — Ambulatory Visit
Admission: RE | Admit: 2014-09-05 | Discharge: 2014-09-05 | Disposition: A | Discharge status: Home / Self Care | Payer: 59 | Source: Ambulatory Visit | Attending: Radiation Oncology | Admitting: Radiation Oncology

## 2014-09-05 DIAGNOSIS — Z510 Encounter for antineoplastic radiation therapy: Secondary | ICD-10-CM | POA: Diagnosis not present

## 2014-09-06 ENCOUNTER — Ambulatory Visit
Admission: RE | Admit: 2014-09-06 | Discharge: 2014-09-06 | Disposition: A | Discharge status: Home / Self Care | Payer: 59 | Source: Ambulatory Visit | Attending: Radiation Oncology | Admitting: Radiation Oncology

## 2014-09-06 ENCOUNTER — Encounter: Payer: Self-pay | Admitting: Radiation Oncology

## 2014-09-06 VITALS — BP 112/87 | HR 89 | Temp 98.0°F | Resp 20 | Wt 189.5 lb

## 2014-09-06 DIAGNOSIS — Z510 Encounter for antineoplastic radiation therapy: Secondary | ICD-10-CM | POA: Diagnosis not present

## 2014-09-06 DIAGNOSIS — C50212 Malignant neoplasm of upper-inner quadrant of left female breast: Secondary | ICD-10-CM

## 2014-09-06 NOTE — Progress Notes (Signed)
Weekly rad txss left brest, mild pink tinge on breast,skin intact,radiaplex bid, started probiotics 1 capsule daily, and is trying low fiber diet for her diarrheah, has 1-2 loose stools still daily but doesn't eat during day while workig, on the weekends has 3-4 loose stools, is seing her GI MD this Friday, occasional twinges itchiness on left breast, energy level fair 8:16 AM

## 2014-09-06 NOTE — Progress Notes (Signed)
Weekly Management Note Current Dose:  23.4 Gy  Projected Dose: 61 Gy   Narrative:  The patient presents for routine under treatment assessment.  CBCT/MVCT images/Port film x-rays were reviewed.  The chart was checked. Breast wise doing ok. Saw GI and is on probiotics. No other complaints.   Physical Findings: Weight: 189 lb 8 oz (85.957 kg). Unchanged/slightly pink.   Impression:  The patient is tolerating radiation.  Plan:  Continue treatment as planned. Continue radiaplex.

## 2014-09-07 ENCOUNTER — Other Ambulatory Visit (HOSPITAL_BASED_OUTPATIENT_CLINIC_OR_DEPARTMENT_OTHER): Payer: 59

## 2014-09-07 ENCOUNTER — Ambulatory Visit
Admission: RE | Admit: 2014-09-07 | Discharge: 2014-09-07 | Disposition: A | Discharge status: Home / Self Care | Payer: 59 | Source: Ambulatory Visit | Attending: Radiation Oncology | Admitting: Radiation Oncology

## 2014-09-07 ENCOUNTER — Ambulatory Visit (HOSPITAL_BASED_OUTPATIENT_CLINIC_OR_DEPARTMENT_OTHER): Payer: 59

## 2014-09-07 ENCOUNTER — Ambulatory Visit (HOSPITAL_BASED_OUTPATIENT_CLINIC_OR_DEPARTMENT_OTHER): Payer: 59 | Admitting: Oncology

## 2014-09-07 ENCOUNTER — Telehealth: Payer: Self-pay | Admitting: Oncology

## 2014-09-07 VITALS — BP 121/82 | HR 71 | Temp 97.8°F | Resp 18 | Ht 65.0 in | Wt 189.4 lb

## 2014-09-07 DIAGNOSIS — C50212 Malignant neoplasm of upper-inner quadrant of left female breast: Secondary | ICD-10-CM

## 2014-09-07 DIAGNOSIS — Z95828 Presence of other vascular implants and grafts: Secondary | ICD-10-CM

## 2014-09-07 DIAGNOSIS — Z510 Encounter for antineoplastic radiation therapy: Secondary | ICD-10-CM | POA: Diagnosis not present

## 2014-09-07 DIAGNOSIS — R5383 Other fatigue: Secondary | ICD-10-CM

## 2014-09-07 DIAGNOSIS — Z171 Estrogen receptor negative status [ER-]: Secondary | ICD-10-CM

## 2014-09-07 LAB — COMPREHENSIVE METABOLIC PANEL (CC13)
ALT: 51 U/L (ref 0–55)
ANION GAP: 14 meq/L — AB (ref 3–11)
AST: 35 U/L — ABNORMAL HIGH (ref 5–34)
Albumin: 4.2 g/dL (ref 3.5–5.0)
Alkaline Phosphatase: 92 U/L (ref 40–150)
BILIRUBIN TOTAL: 0.42 mg/dL (ref 0.20–1.20)
BUN: 10.5 mg/dL (ref 7.0–26.0)
CALCIUM: 10.1 mg/dL (ref 8.4–10.4)
CHLORIDE: 103 meq/L (ref 98–109)
CO2: 25 meq/L (ref 22–29)
Creatinine: 0.7 mg/dL (ref 0.6–1.1)
Glucose: 102 mg/dl (ref 70–140)
Potassium: 3.1 mEq/L — ABNORMAL LOW (ref 3.5–5.1)
SODIUM: 141 meq/L (ref 136–145)
TOTAL PROTEIN: 6.9 g/dL (ref 6.4–8.3)

## 2014-09-07 LAB — CBC WITH DIFFERENTIAL/PLATELET
BASO%: 0.9 % (ref 0.0–2.0)
Basophils Absolute: 0 10*3/uL (ref 0.0–0.1)
EOS ABS: 0.1 10*3/uL (ref 0.0–0.5)
EOS%: 2.2 % (ref 0.0–7.0)
HEMATOCRIT: 38.9 % (ref 34.8–46.6)
HGB: 12.7 g/dL (ref 11.6–15.9)
LYMPH#: 0.8 10*3/uL — AB (ref 0.9–3.3)
LYMPH%: 15.3 % (ref 14.0–49.7)
MCH: 30.2 pg (ref 25.1–34.0)
MCHC: 32.7 g/dL (ref 31.5–36.0)
MCV: 92.2 fL (ref 79.5–101.0)
MONO#: 0.6 10*3/uL (ref 0.1–0.9)
MONO%: 10.7 % (ref 0.0–14.0)
NEUT%: 70.9 % (ref 38.4–76.8)
NEUTROS ABS: 3.7 10*3/uL (ref 1.5–6.5)
Platelets: 301 10*3/uL (ref 145–400)
RBC: 4.22 10*6/uL (ref 3.70–5.45)
RDW: 14.2 % (ref 11.2–14.5)
WBC: 5.2 10*3/uL (ref 3.9–10.3)

## 2014-09-07 MED ORDER — SODIUM CHLORIDE 0.9 % IJ SOLN
10.0000 mL | INTRAMUSCULAR | Status: DC | PRN
  Administered 2014-09-07: 10 mL via INTRAVENOUS
  Filled 2014-09-07: qty 10

## 2014-09-07 MED ORDER — HEPARIN SOD (PORK) LOCK FLUSH 100 UNIT/ML IV SOLN
500.0000 [IU] | Freq: Once | INTRAVENOUS | Status: AC
  Administered 2014-09-07: 500 [IU] via INTRAVENOUS
  Filled 2014-09-07: qty 5

## 2014-09-07 NOTE — Telephone Encounter (Signed)
per pof to sch pt appt-gave pt copy opf sch °

## 2014-09-07 NOTE — Patient Instructions (Signed)

## 2014-09-07 NOTE — Progress Notes (Signed)
Alexandria Bradley  [phone] [phone]     ID: Alexandria Bradley OB: 1961-09-14  MR#: [id number]  BTD#:[id number]  PCP: Lovenia Kim, MD GYN:   SU: [name] OTHER MD: [name], Delrae Rend, Wilford Corner  CHIEF COMPLAINT: Triple negative breast cancer  CURRENT TREATMENT: Adjuvant radiation  BREAST CANCER HISTORY: From the original intake note of 02/16/2014:  [name] palpated a mass in her left breast early May and brought it immediately to her gynecologist attention. He set her up for bilateral diagnostic mammography and left ultrasonography at [facility] 02/07/2014. Mammography showed a 1.3 cm oval mass with spiculated margins in the left breast at the 11:00 position. This was palpable. Ultrasound showed a 1.1 cm tall her than wide mass with a microlobulated margins. He was hypoechoic. There were no abnormalities noted in the left axilla.  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 Lyndon Code at conference 02/16/2014), triple negative, with an MIB-1 of 50%.  The patient's subsequent history is as detailed below  INTERVAL HISTORY: Alexandria Bradley returns today for follow up of her breast cancer, accompanied by her husband bruce. She completed her chemotherapy in October--it had to be interrupted because of significant neuropathy-happen and has since started radiation. She is tolerating the radiation well. She had to miss a few treatments because of mechanical issues with the equipment, which she will finish the second week in January unless there are further delays  REVIEW OF SYSTEMS: [name] is back at work. She is mildly to moderately fatigued. 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 Her nails are coming back normal. Her hair is growing back also nicely. She still has a tendency to have a bowel  movement within 20-30 minutes after eating. She has been evaluated by [name] for this. She has been shown to be PCR negative for C. difficile. She is trying probiotics. I encouraged her to continue as we populating the got fluoride can be a long-term project. She is not exercising regularly at present. Aside from these issues, a detailed review of systems today was stable  PAST MEDICAL HISTORY: Past Medical History  Diagnosis Date  . Thyroid disease   . Osteoporosis   . Psoriasis   . Breast cancer   . Arthritis   . Back pain   . Hypothyroidism   . Anxiety   . Breast cancer     left  . Hypertension   . PONV (postoperative nausea and vomiting)   . Hypercholesterolemia   . PVC's (premature ventricular contractions)   . Kidney stones   . Headache(784.0)     PMH : migraines    PAST SURGICAL HISTORY: Past Surgical History  Procedure Laterality Date  . Cholecystectomy    . Tonsillectomy    . Tubal ligation    . Myomectomy    . Lipoma excision    . Portacath placement N/A 03/01/2014    Procedure: INSERTION PORT-A-CATH;  Surgeon: [name], MD;  Location: [place];  Service: General;  Laterality: N/A;  . Colonoscopy N/A 06/27/2014    Procedure: COLONOSCOPY;  Surgeon: Lear Ng, MD;  Location: [place];  Service: Endoscopy;  Laterality: N/A;    FAMILY HISTORY Family History  Problem Relation Age of Onset  . Endometrial cancer Mother   . Lung cancer Father   . Brain cancer Father    the patient's father  died at the age of 27 from what may have been metastatic lung cancer. The patient knows very little about the father's side of her family. Her mother is alive at age 49. She was recently diagnosed with endometrial cancer. The patient has one brother, 2 sisters. There is no history of breast or ovarian cancer in the family.  GYNECOLOGIC HISTORY:  Menarche age 68, first live birth age 34. The patient is GX P3. One child died shortly after  birth. She stopped having periods approximately 2005. She did not use hormone replacement. 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 Her husband Alexandria Bradley is a Insurance underwriter. Son Alexandria Bradley is that she Nurse, adult in [place]. Daughter Alexandria Bradley lives in [place] and works for [employer] as an Therapist, sports.    ADVANCED DIRECTIVES: Not in place   HEALTH MAINTENANCE: History  Substance Use Topics  . Smoking status: Former Smoker    Types: Cigarettes  . Smokeless tobacco: Never Used     Comment: Quit smoking cigarettes in 2002  . Alcohol Use: Yes     Comment: social     Colonoscopy: 2010  PAP: 2013  Bone density: 05/15/2009, at [facility], normal, lowest T score -0.8  Lipid panel:  Allergies  Allergen Reactions  . Ciprofloxacin Anxiety and Rash  . Prednisone Shortness Of Breath and Other (See Comments)    "jacks me up" jittery  . Codeine Nausea Only and Other (See Comments)    Nausea   . Cortisone Swelling and Other (See Comments)    "jack me up"    Current Outpatient Prescriptions  Medication Sig Dispense Refill  . acetaminophen (TYLENOL) 500 MG tablet Take 1,000 mg by mouth every 6 (six) hours as needed for headache.     Marland Kitchen amLODipine (NORVASC) 5 MG tablet Take 5 mg by mouth daily.     . clonazePAM (KLONOPIN) 0.5 MG tablet Take 0.5 mg by mouth 2 (two) times daily as needed for anxiety.    . hyaluronate sodium (RADIAPLEXRX) GEL Apply 1 application topically 2 (two) times daily. Apply to left breast after rad tx and bedtime daily    . lidocaine-prilocaine (EMLA) cream Apply 1 application topically as needed. 30 g 1  . liothyronine (CYTOMEL) 5 MCG tablet Take 5 mcg by mouth daily.     . non-metallic deodorant Jethro Poling) MISC Apply 1 application topically daily.    . Probiotic Product (SOLUBLE FIBER/PROBIOTICS PO) Take 1 capsule by mouth daily.    Marland Kitchen SYNTHROID 137  MCG tablet Take 137 mcg by mouth daily before breakfast.     . triamterene-hydrochlorothiazide (MAXZIDE-25) 37.5-25 MG per tablet Take 1 tablet by mouth daily.     Marland Kitchen zolpidem (AMBIEN) 5 MG tablet Take 1 tablet (5 mg total) by mouth at bedtime as needed for sleep. Do not use in conjunction with Klonipin or Ativan 30 tablet 0   No current facility-administered medications for this visit.    OBJECTIVE: Middle-aged white woman who appears stated age  29 Vitals:   09/07/14 1003  BP: 121/82  Pulse: 71  Temp: 97.8 F (36.6 C)  Resp: 18     Body mass index is 31.52 kg/(m^2).    ECOG FS:1 - Symptomatic but completely ambulatory  Sclerae unicteric, EOMs intact Oropharynx clear and moist No cervical or supraclavicular adenopathy Lungs no rales or rhonchi Heart regular rate and rhythm Abd soft, nontender,  positive bowel sounds MSK no focal spinal tenderness, no upper extremity lymphedema Neuro: nonfocal, well oriented, appropriate affect Breasts: The right breast is unremarkable. The left breast is status post lumpectomy and currently undergoing radiation. There is very minimal erythema chiefly over the axilla and inframammary fold. There is no desquamation. The left axilla is benign.   LAB RESULTS: I No results found for: SPEP  Lab Results  Component Value Date   WBC 5.2 09/07/2014   NEUTROABS 3.7 09/07/2014   HGB 12.7 09/07/2014   HCT 38.9 09/07/2014   MCV 92.2 09/07/2014   PLT 301 09/07/2014      Chemistry      Component Value Date/Time   NA 141 07/22/2014 0855   NA 140 03/13/2009 2325   K 3.4* 07/22/2014 0855   K 3.7 03/13/2009 2325   CL 105 03/13/2009 2325   CO2 23 07/22/2014 0855   CO2 29 03/13/2009 2325   BUN 15.2 07/22/2014 0855   BUN 12 03/13/2009 2325   CREATININE 0.8 07/22/2014 0855   CREATININE 0.76 03/13/2009 2325      Component Value Date/Time   CALCIUM 10.6* 07/22/2014 0855   CALCIUM 9.4 03/13/2009 2325   ALKPHOS 72 07/22/2014 0855   ALKPHOS 131*  03/13/2009 2325   AST 31 07/22/2014 0855   AST 136* 03/13/2009 2325   ALT 62* 07/22/2014 0855   ALT 303* 03/13/2009 2325   BILITOT 0.36 07/22/2014 0855   BILITOT 0.5 03/13/2009 2325       No results found for: LABCA2  No components found for: IPJAS505  No results for input(s): INR in the last 168 hours.  Urinalysis    Component Value Date/Time   COLORURINE YELLOW 03/13/2009 2251   APPEARANCEUR CLOUDY* 03/13/2009 2251   LABSPEC 1.020 06/15/2014 1101   LABSPEC 1.018 03/13/2009 2251   PHURINE 5.5 03/13/2009 2251   GLUCOSEU Negative 06/15/2014 1101   GLUCOSEU NEGATIVE 03/13/2009 2251   HGBUR NEGATIVE 03/13/2009 2251   BILIRUBINUR NEGATIVE 03/13/2009 2251   KETONESUR NEGATIVE 03/13/2009 2251   PROTEINUR NEGATIVE 03/13/2009 2251   UROBILINOGEN 0.2 06/15/2014 1101   UROBILINOGEN 0.2 03/13/2009 2251   NITRITE NEGATIVE 03/13/2009 2251   LEUKOCYTESUR SMALL* 03/13/2009 2251    STUDIES: No results found.   ASSESSMENT: 53 y.o. [place] woman status post left breast biopsy 02/08/2014 for a clinical T1c N0, stage IA invasive ductal carcinoma, grade 3, triple negative, with an MIB-1 of 50%.  (1) status post left lumpectomy and sentinel lymph node sampling 03/01/2014 for a pT1c pN0, stage IA invasive ductal carcinoma, grade 3, with negative margins, repeat prognostic panel again triple negative.  (2) adjuvant chemotherapy started a 03/25/2014 consisting of cyclophosphamide and doxorubicin given in dose dense fashion x4, completed 05/06/2014; followed by weekly paclitaxel x1, on 05/27/2014, poorly tolerated;   (a) switched to Abraxane starting 06/10/2014, with 11 Abraxane doses planned  (b) dose reduced by 20% because of neutropenia starting 07/01/2014 dose  (c) discontinued after 4th Abraxane dose 07/15/2014 due to neuropathy  (3) adjuvant radiation to be completed 10/06/2013  (4) skin rash--resolved  (5) question of sleep apnea  (6) transverse colon lesion noted on CT scan  from 06/17/2014 - negative biopsy, also showed fatty liver  PLAN: Rina has made an excellent recovery from her chemotherapy and thankfully her 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 She understands the takes 2-4 months to get completely new fingernails and 6-8 months to get completely new toenails. They are coming in nicely and she now has some hair  and her eyelashes are also back.  She has some fatigue, possibly secondary to the radiation, but she is working full-time. I have encouraged her to start a regular exercise program. I also encouraged her to consider the finding your new normal group, which will be starting in January.  She will finish her radiation mid-January. I am going to request an appointment with Dr. Donne Hazel for port removal late January. She is going to return to see me late March. If she is doing well at that time we will start seeing her on an every three-month basis alternating with her other physicians.  Arriah has a good understanding of the overall plan. She agrees with it. She knows the goal of treatment in her case is cure. She will call with any problems that may develop before her next visit.  09/07/2014 10:08 AM  Chauncey Cruel, MD

## 2014-09-08 ENCOUNTER — Ambulatory Visit
Admission: RE | Admit: 2014-09-08 | Discharge: 2014-09-08 | Disposition: A | Discharge status: Home / Self Care | Payer: 59 | Source: Ambulatory Visit | Attending: Radiation Oncology | Admitting: Radiation Oncology

## 2014-09-08 DIAGNOSIS — Z510 Encounter for antineoplastic radiation therapy: Secondary | ICD-10-CM | POA: Diagnosis not present

## 2014-09-09 ENCOUNTER — Ambulatory Visit
Admission: RE | Admit: 2014-09-09 | Discharge: 2014-09-09 | Disposition: A | Discharge status: Home / Self Care | Payer: 59 | Source: Ambulatory Visit | Attending: Radiation Oncology | Admitting: Radiation Oncology

## 2014-09-09 DIAGNOSIS — Z510 Encounter for antineoplastic radiation therapy: Secondary | ICD-10-CM | POA: Diagnosis not present

## 2014-09-12 ENCOUNTER — Ambulatory Visit
Admission: RE | Admit: 2014-09-12 | Discharge: 2014-09-12 | Disposition: A | Discharge status: Home / Self Care | Payer: 59 | Source: Ambulatory Visit | Attending: Radiation Oncology | Admitting: Radiation Oncology

## 2014-09-12 DIAGNOSIS — Z510 Encounter for antineoplastic radiation therapy: Secondary | ICD-10-CM | POA: Diagnosis not present

## 2014-09-13 ENCOUNTER — Ambulatory Visit
Admission: RE | Admit: 2014-09-13 | Discharge: 2014-09-13 | Disposition: A | Discharge status: Home / Self Care | Payer: 59 | Source: Ambulatory Visit | Attending: Radiation Oncology | Admitting: Radiation Oncology

## 2014-09-13 VITALS — BP 106/81 | HR 83 | Temp 97.8°F | Wt 190.0 lb

## 2014-09-13 DIAGNOSIS — C50212 Malignant neoplasm of upper-inner quadrant of left female breast: Secondary | ICD-10-CM | POA: Diagnosis not present

## 2014-09-13 DIAGNOSIS — Z510 Encounter for antineoplastic radiation therapy: Secondary | ICD-10-CM | POA: Diagnosis not present

## 2014-09-13 MED ORDER — BIAFINE EX EMUL
Freq: Once | CUTANEOUS | Status: AC
  Administered 2014-09-13: 09:00:00 via TOPICAL

## 2014-09-13 NOTE — Progress Notes (Signed)
Weekly Management Note Current Dose: 32.4  Gy  Projected Dose: 50.4 Gy   Narrative:  The patient presents for routine under treatment assessment.  CBCT/MVCT images/Port film x-rays were reviewed.  The chart was checked. More itching medially. GI symptoms are better.  Physical Findings: Weight: 190 lb (86.183 kg). Unchanged  Impression:  The patient is tolerating radiation.  Plan:  Continue treatment as planned.Switch to biafene. Add hydrocortisone.

## 2014-09-13 NOTE — Progress Notes (Signed)
Weekly assessment of radiation to left breast.Mild itching  rash to upper inner chest.No breaks in skin.Increased fatigue.continue application of radaiplex.

## 2014-09-14 ENCOUNTER — Ambulatory Visit
Admission: RE | Admit: 2014-09-14 | Discharge: 2014-09-14 | Disposition: A | Discharge status: Home / Self Care | Payer: 59 | Source: Ambulatory Visit | Attending: Radiation Oncology | Admitting: Radiation Oncology

## 2014-09-14 DIAGNOSIS — Z510 Encounter for antineoplastic radiation therapy: Secondary | ICD-10-CM | POA: Diagnosis not present

## 2014-09-15 ENCOUNTER — Ambulatory Visit: Admission: RE | Admit: 2014-09-15 | Payer: 59 | Source: Ambulatory Visit | Admitting: Radiation Oncology

## 2014-09-15 ENCOUNTER — Ambulatory Visit
Admission: RE | Admit: 2014-09-15 | Discharge: 2014-09-15 | Disposition: A | Discharge status: Home / Self Care | Payer: 59 | Source: Ambulatory Visit | Attending: Radiation Oncology | Admitting: Radiation Oncology

## 2014-09-15 DIAGNOSIS — Z510 Encounter for antineoplastic radiation therapy: Secondary | ICD-10-CM | POA: Diagnosis not present

## 2014-09-16 ENCOUNTER — Ambulatory Visit
Admission: RE | Admit: 2014-09-16 | Discharge: 2014-09-16 | Disposition: A | Discharge status: Home / Self Care | Payer: 59 | Source: Ambulatory Visit | Attending: Radiation Oncology | Admitting: Radiation Oncology

## 2014-09-16 DIAGNOSIS — Z510 Encounter for antineoplastic radiation therapy: Secondary | ICD-10-CM | POA: Diagnosis not present

## 2014-09-19 ENCOUNTER — Ambulatory Visit
Admission: RE | Admit: 2014-09-19 | Discharge: 2014-09-19 | Disposition: A | Discharge status: Home / Self Care | Payer: 59 | Source: Ambulatory Visit | Attending: Radiation Oncology | Admitting: Radiation Oncology

## 2014-09-19 DIAGNOSIS — Z510 Encounter for antineoplastic radiation therapy: Secondary | ICD-10-CM | POA: Diagnosis not present

## 2014-09-20 ENCOUNTER — Ambulatory Visit
Admission: RE | Admit: 2014-09-20 | Discharge: 2014-09-20 | Disposition: A | Discharge status: Home / Self Care | Payer: 59 | Source: Ambulatory Visit | Attending: Radiation Oncology | Admitting: Radiation Oncology

## 2014-09-20 ENCOUNTER — Encounter: Payer: Self-pay | Admitting: Radiation Oncology

## 2014-09-20 VITALS — BP 132/70 | HR 94 | Temp 97.7°F | Resp 20 | Ht 65.0 in | Wt 190.7 lb

## 2014-09-20 DIAGNOSIS — Z510 Encounter for antineoplastic radiation therapy: Secondary | ICD-10-CM | POA: Diagnosis not present

## 2014-09-20 DIAGNOSIS — C50212 Malignant neoplasm of upper-inner quadrant of left female breast: Secondary | ICD-10-CM

## 2014-09-20 NOTE — Progress Notes (Signed)
Weekly rad txs left breast 23/33  Completed, erythema, drynes around nipple area, using biafine now for itching, which helps stated patient, fatigued,  8:33 AM

## 2014-09-20 NOTE — Progress Notes (Signed)
  Radiation Oncology         [phone] ________________________________  Name: Alexandria Bradley MRN: [id number]  Date: 09/20/2014  DOB: 12-19-1960  Electron setup check    Narrative . . . . . . . . The patient was taken to the linear accelerator and placed in the treatment position. Her electron set up was checked showing accurate position for treatment.  ________________________________   Blair Promise, PhD, MD

## 2014-09-20 NOTE — Progress Notes (Signed)
  Radiation Oncology         [phone] ________________________________  Name: Alexandria Bradley MRN: [id number]  Date: 09/20/2014  DOB: Apr 27, 1961  Weekly Radiation Therapy Management  Diagnosis :stage I left breast cancer  Current Dose: 41.4 Gy     Planned Dose:  45+ Gy  Narrative . . . . . . . . The patient presents for routine under treatment assessment.                                   The patient is without complaint of her itching and soreness within the nipple area                                 Set-up films were reviewed.                                 The chart was checked. Physical Findings. . .  height is 5\' 5"  (1.651 m) and weight is 190 lb 11.2 oz (86.501 kg). Her oral temperature is 97.7 F (36.5 C). Her blood pressure is 132/70 and her pulse is 94. Her respiration is 20. . The lungs are clear. The heart has a regular rhythm and rate. The left breast area shows some erythema without skin breakdown. Impression . . . . . . . The patient is tolerating radiation. Plan . . . . . . . . . . . . Continue treatment as planned.  ________________________________   Blair Promise, PhD, MD

## 2014-09-21 ENCOUNTER — Ambulatory Visit
Admission: RE | Admit: 2014-09-21 | Discharge: 2014-09-21 | Disposition: A | Discharge status: Home / Self Care | Payer: 59 | Source: Ambulatory Visit | Attending: Radiation Oncology | Admitting: Radiation Oncology

## 2014-09-21 DIAGNOSIS — Z510 Encounter for antineoplastic radiation therapy: Secondary | ICD-10-CM | POA: Diagnosis not present

## 2014-09-22 ENCOUNTER — Ambulatory Visit
Admission: RE | Admit: 2014-09-22 | Discharge: 2014-09-22 | Disposition: A | Discharge status: Home / Self Care | Payer: 59 | Source: Ambulatory Visit | Attending: Radiation Oncology | Admitting: Radiation Oncology

## 2014-09-22 DIAGNOSIS — Z510 Encounter for antineoplastic radiation therapy: Secondary | ICD-10-CM | POA: Diagnosis not present

## 2014-09-23 ENCOUNTER — Ambulatory Visit: Payer: 59

## 2014-09-26 ENCOUNTER — Ambulatory Visit
Admission: RE | Admit: 2014-09-26 | Discharge: 2014-09-26 | Disposition: A | Discharge status: Home / Self Care | Payer: 59 | Source: Ambulatory Visit | Attending: Radiation Oncology | Admitting: Radiation Oncology

## 2014-09-26 DIAGNOSIS — Z510 Encounter for antineoplastic radiation therapy: Secondary | ICD-10-CM | POA: Diagnosis not present

## 2014-09-27 ENCOUNTER — Ambulatory Visit
Admission: RE | Admit: 2014-09-27 | Discharge: 2014-09-27 | Disposition: A | Discharge status: Home / Self Care | Payer: 59 | Source: Ambulatory Visit | Attending: Radiation Oncology | Admitting: Radiation Oncology

## 2014-09-27 DIAGNOSIS — Z510 Encounter for antineoplastic radiation therapy: Secondary | ICD-10-CM | POA: Diagnosis not present

## 2014-09-27 DIAGNOSIS — C50212 Malignant neoplasm of upper-inner quadrant of left female breast: Secondary | ICD-10-CM

## 2014-09-27 NOTE — Progress Notes (Deleted)
Weekly assessment of radiation to right breast.Marked hyperpigmentation without peeling or rash.Give another tube of biafine.completed 21 of 25 treatments and will start boost of 5 next week.Breast is tender.

## 2014-09-27 NOTE — Progress Notes (Signed)
Weekly Management Note Current Dose: 49  Gy  Projected Dose: 61 Gy   Narrative:  The patient presents for routine under treatment assessment.  CBCT/MVCT images/Port film x-rays were reviewed.  The chart was checked. Doing well. Some skin irritation. Verified position of electron beam on the treatment machine.   Physical Findings: Weight: 191 lb 6.4 oz (86.818 kg). Dermatitis over left breast. NO breakdown.  Impression:  The patient is tolerating radiation.  Plan:  Continue treatment as planned. Continue biafene.

## 2014-09-28 ENCOUNTER — Ambulatory Visit
Admission: RE | Admit: 2014-09-28 | Discharge: 2014-09-28 | Disposition: A | Discharge status: Home / Self Care | Payer: 59 | Source: Ambulatory Visit | Attending: Radiation Oncology | Admitting: Radiation Oncology

## 2014-09-28 DIAGNOSIS — Z510 Encounter for antineoplastic radiation therapy: Secondary | ICD-10-CM | POA: Diagnosis not present

## 2014-09-29 ENCOUNTER — Ambulatory Visit
Admission: RE | Admit: 2014-09-29 | Discharge: 2014-09-29 | Disposition: A | Discharge status: Home / Self Care | Payer: 59 | Source: Ambulatory Visit | Attending: Radiation Oncology | Admitting: Radiation Oncology

## 2014-09-29 DIAGNOSIS — Z510 Encounter for antineoplastic radiation therapy: Secondary | ICD-10-CM | POA: Diagnosis not present

## 2014-10-03 ENCOUNTER — Telehealth: Payer: Self-pay

## 2014-10-03 ENCOUNTER — Ambulatory Visit: Payer: 59

## 2014-10-03 NOTE — Telephone Encounter (Signed)
Patient called to inform us that she will miss treatment today and possibly tomorrow.She has head and lung congestion, shortness of breath and low grade fever at night up to 100.3.Informed patient to contact her primary care physician today.Told she should be able to come for treatment tomorrow.She will call me later to inform of health status.

## 2014-10-04 ENCOUNTER — Ambulatory Visit
Admission: RE | Admit: 2014-10-04 | Discharge: 2014-10-04 | Disposition: A | Discharge status: Home / Self Care | Payer: 59 | Source: Ambulatory Visit | Attending: Radiation Oncology | Admitting: Radiation Oncology

## 2014-10-04 ENCOUNTER — Ambulatory Visit: Payer: 59 | Admitting: Radiation Oncology

## 2014-10-04 VITALS — BP 128/82 | HR 78 | Temp 98.2°F | Wt 189.0 lb

## 2014-10-04 DIAGNOSIS — C50212 Malignant neoplasm of upper-inner quadrant of left female breast: Secondary | ICD-10-CM

## 2014-10-04 DIAGNOSIS — Z510 Encounter for antineoplastic radiation therapy: Secondary | ICD-10-CM | POA: Diagnosis not present

## 2014-10-04 NOTE — Progress Notes (Addendum)
Weekly assessment of radiation to left breast.Denies pain.Completes treatment of Friday.Continue application of biafine twice daily.Feeling bad with congestion.Started on z-pack, cough medication and awaiting approval on inhaler.Small area of redness is mild.Patient will see me Friday if another tube of biafine needed.

## 2014-10-04 NOTE — Progress Notes (Signed)
Weekly Management Note Current Dose: 55  Gy  Projected Dose: 61 Gy   Narrative:  The patient presents for routine under treatment assessment.  CBCT/MVCT images/Port film x-rays were reviewed.  The chart was checked. Has been under the weather with cough and fever but getting better. Mild skin irriation. Using biafene.   Physical Findings: Weight: 189 lb (85.73 kg). Skin is intact. Pink over boost area.   Impression:  The patient is tolerating radiation.  Plan:  Continue treatment as planned.Continue biafene. Has appt with surgeon in Feb to discuss PAC removal. Also has appt with med onc.

## 2014-10-05 ENCOUNTER — Ambulatory Visit
Admission: RE | Admit: 2014-10-05 | Discharge: 2014-10-05 | Disposition: A | Discharge status: Home / Self Care | Payer: 59 | Source: Ambulatory Visit | Attending: Radiation Oncology | Admitting: Radiation Oncology

## 2014-10-05 DIAGNOSIS — Z510 Encounter for antineoplastic radiation therapy: Secondary | ICD-10-CM | POA: Diagnosis not present

## 2014-10-06 ENCOUNTER — Ambulatory Visit
Admission: RE | Admit: 2014-10-06 | Discharge: 2014-10-06 | Disposition: A | Discharge status: Home / Self Care | Payer: 59 | Source: Ambulatory Visit | Attending: Radiation Oncology | Admitting: Radiation Oncology

## 2014-10-06 ENCOUNTER — Ambulatory Visit: Payer: 59

## 2014-10-06 DIAGNOSIS — Z510 Encounter for antineoplastic radiation therapy: Secondary | ICD-10-CM | POA: Diagnosis not present

## 2014-10-07 ENCOUNTER — Encounter: Payer: Self-pay | Admitting: Radiation Oncology

## 2014-10-07 ENCOUNTER — Ambulatory Visit
Admission: RE | Admit: 2014-10-07 | Discharge: 2014-10-07 | Disposition: A | Discharge status: Home / Self Care | Payer: 59 | Source: Ambulatory Visit | Attending: Radiation Oncology | Admitting: Radiation Oncology

## 2014-10-07 ENCOUNTER — Ambulatory Visit: Payer: 59

## 2014-10-07 VITALS — BP 138/89 | HR 75 | Temp 98.0°F | Resp 20 | Wt 190.6 lb

## 2014-10-07 DIAGNOSIS — Z510 Encounter for antineoplastic radiation therapy: Secondary | ICD-10-CM | POA: Diagnosis not present

## 2014-10-07 DIAGNOSIS — C50212 Malignant neoplasm of upper-inner quadrant of left female breast: Secondary | ICD-10-CM

## 2014-10-07 NOTE — Progress Notes (Signed)
  Radiation Oncology         [phone] ________________________________  Name: Alexandria Bradley MRN: [id number]  Date: 10/07/2014  DOB: Jul 03, 1961  End of Treatment Note  Diagnosis:   T1cN0 left breast cancer  Indication for treatment:  Curative     Radiation treatment dates:   08/18/2014-10/07/2014  Site/dose:  Site/dose:   Left breast with breath hold technique/ 45 Gy at 1.8 Gy per fraction x 25 fractions.  Left breast boost/ 16 Gy at 2 Gy per fraction x 8 fractions  Beams/energy:  Opposed tangents with reduced fields / 6 MV photons Enface electrons / 9 MeV electrons  Narrative: The patient tolerated radiation treatment relatively well.   She had erythema over the left breast but no moist desquamation. This was treated with biafene. She also struggled with diarrhea and a cough which were not related to her radiation.   Plan: The patient has completed radiation treatment. The patient will return to radiation oncology clinic for routine followup in one month. I advised them to call or return sooner if they have any questions or concerns related to their recovery or treatment.  ------------------------------------------------  [name], MD

## 2014-10-07 NOTE — Progress Notes (Signed)
  Department of Radiation Oncology  Phone:  [phone] Fax:        [phone]  Weekly Treatment Note    Name: Alexandria Bradley Date: 10/07/2014 MRN: [id number] DOB: 07-24-1961   Current dose: 61 Gy  Current fraction: 33   MEDICATIONS: Current Outpatient Prescriptions  Medication Sig Dispense Refill  . acetaminophen (TYLENOL) 500 MG tablet Take 1,000 mg by mouth every 6 (six) hours as needed for headache.     . albuterol (PROAIR HFA) 108 (90 BASE) MCG/ACT inhaler Inhale into the lungs.    Marland Kitchen amLODipine (NORVASC) 5 MG tablet Take 5 mg by mouth daily.     Marland Kitchen azithromycin (ZITHROMAX) 250 MG tablet Take 2 tablets (500 mg) on first day, then take one tablet (250 mg) each day for the next 4 days    . benzonatate (TESSALON) 100 MG capsule Take 100 mg by mouth.    . clonazePAM (KLONOPIN) 0.5 MG tablet Take 0.5 mg by mouth 2 (two) times daily as needed for anxiety.    Marland Kitchen emollient (BIAFINE) cream Apply topically as needed.    . hyaluronate sodium (RADIAPLEXRX) GEL Apply 1 application topically 2 (two) times daily. Apply to left breast after rad tx and bedtime daily    . lidocaine-prilocaine (EMLA) cream Apply 1 application topically as needed. 30 g 1  . liothyronine (CYTOMEL) 5 MCG tablet Take 5 mcg by mouth daily.     . non-metallic deodorant Jethro Poling) MISC Apply 1 application topically daily.    . Probiotic Product (SOLUBLE FIBER/PROBIOTICS PO) Take 1 capsule by mouth daily.    Marland Kitchen SYNTHROID 137 MCG tablet Take 137 mcg by mouth daily before breakfast.     . triamterene-hydrochlorothiazide (MAXZIDE-25) 37.5-25 MG per tablet Take 1 tablet by mouth daily.     Marland Kitchen zolpidem (AMBIEN) 5 MG tablet Take 1 tablet (5 mg total) by mouth at bedtime as needed for sleep. Do not use in conjunction with Klonipin or Ativan 30 tablet 0   No current facility-administered medications for this encounter.     ALLERGIES: Ciprofloxacin; Prednisone; Codeine; and Cortisone   LABORATORY DATA:  Lab Results   Component Value Date   WBC 5.2 09/07/2014   HGB 12.7 09/07/2014   HCT 38.9 09/07/2014   MCV 92.2 09/07/2014   PLT 301 09/07/2014   Lab Results  Component Value Date   NA 141 09/07/2014   K 3.1* 09/07/2014   CL 105 03/13/2009   CO2 25 09/07/2014   Lab Results  Component Value Date   ALT 51 09/07/2014   AST 35* 09/07/2014   ALKPHOS 92 09/07/2014   BILITOT 0.42 09/07/2014     NARRATIVE: [name] was seen today for weekly treatment management. The chart was checked and the patient's films were reviewed. The patient is doing very well. She finished today. Scan has been improving in the inframammary region. No significant new complaints.  PHYSICAL EXAMINATION: weight is 190 lb 9.6 oz (86.456 kg). Her oral temperature is 98 F (36.7 C). Her blood pressure is 138/89 and her pulse is 75. Her respiration is 20.      the skin looks quite good for finishing treatment. No moist desquamation. Some dermatitis increased in the boost area.  ASSESSMENT: The patient did satisfactorily with treatment. The patient will continue her skin care for at least the next several weeks.  PLAN: The patient will follow-up in our clinic in 1 month.

## 2014-10-07 NOTE — Progress Notes (Addendum)
Patient completed radiation to left breast today. She states she no longer has desquamation which was under her left breast, and she is applying Baifine cream to left breast treatment area for hyperpigmentation. She is fatigued, has 1 month FU appointment. She states that she will complete her antibiotic today, feels better from her recent cold.

## 2014-10-21 NOTE — Progress Notes (Signed)
Name: Alexandria Bradley   MRN: [id number]  Date:  09/15/14   DOB: [birth year]  Status:outpatient    DIAGNOSIS: Left breast cancer  CONSENT VERIFIED: yes   SET UP: Patient is setup supine   IMMOBILIZATION:  The following immobilization was used:Custom Moldable Pillow, breast board.   NARRATIVE: [name] underwent complex simulation and treatment planning for her boost treatment today.  Her tumor volume was outlined on the planning CT scan. The depth of her cavity was felt to be appropriate for treatment with electrons     9 MeV electrons will be prescribed to the 90% isodose line.   I personally oversaw and approved the construction of a unique block which will be used for beam modification purposes.  A special port plan is requested.

## 2014-10-24 ENCOUNTER — Telehealth: Payer: Self-pay | Admitting: Oncology

## 2014-10-24 NOTE — Telephone Encounter (Signed)
pt cld to sch flush appt-sch & gave pt time & date

## 2014-10-31 ENCOUNTER — Ambulatory Visit (HOSPITAL_BASED_OUTPATIENT_CLINIC_OR_DEPARTMENT_OTHER): Payer: 59

## 2014-10-31 VITALS — BP 143/85 | HR 67 | Temp 98.0°F

## 2014-10-31 DIAGNOSIS — C50212 Malignant neoplasm of upper-inner quadrant of left female breast: Secondary | ICD-10-CM

## 2014-10-31 DIAGNOSIS — Z95828 Presence of other vascular implants and grafts: Secondary | ICD-10-CM

## 2014-10-31 DIAGNOSIS — Z452 Encounter for adjustment and management of vascular access device: Secondary | ICD-10-CM

## 2014-10-31 MED ORDER — HEPARIN SOD (PORK) LOCK FLUSH 100 UNIT/ML IV SOLN
500.0000 [IU] | Freq: Once | INTRAVENOUS | Status: AC
  Administered 2014-10-31: 500 [IU] via INTRAVENOUS
  Filled 2014-10-31: qty 5

## 2014-10-31 MED ORDER — SODIUM CHLORIDE 0.9 % IJ SOLN
10.0000 mL | INTRAMUSCULAR | Status: DC | PRN
  Administered 2014-10-31: 10 mL via INTRAVENOUS
  Filled 2014-10-31: qty 10

## 2014-10-31 NOTE — Patient Instructions (Signed)

## 2014-11-10 ENCOUNTER — Ambulatory Visit
Admission: RE | Admit: 2014-11-10 | Discharge: 2014-11-10 | Disposition: A | Discharge status: Home / Self Care | Payer: 59 | Source: Ambulatory Visit | Attending: Radiation Oncology | Admitting: Radiation Oncology

## 2014-11-10 VITALS — BP 126/67 | HR 88 | Temp 97.6°F | Wt 193.7 lb

## 2014-11-10 DIAGNOSIS — C50212 Malignant neoplasm of upper-inner quadrant of left female breast: Secondary | ICD-10-CM

## 2014-11-10 NOTE — Progress Notes (Signed)
Patient for one month follow up completion of radiation to left breast on 10/07/14.skin has completely healed.She has some tenderness but otherwise doing fine.follow up with Dr. Jana Hakim in March.

## 2014-11-12 NOTE — Progress Notes (Signed)
   Department of Radiation Oncology  Phone:  [phone] Fax:        [phone]   Name: Alexandria Bradley MRN: [id number]  DOB: [birth year]  Date: 11/10/2014  Follow Up Visit Note  Diagnosis: Breast cancer of upper-inner quadrant of left female breast   Staging form: Breast, AJCC 7th Edition     Clinical: Stage IA (T1c, N0, cM0) - Unsigned       Staging comments: Staged at breast conference 5.20.15  Summary and Interval since last radiation: 61 Gy to the left breast completed 10/07/14  Interval History: Alexandria Bradley presents today for routine followup.  She is doing well and pleased that her hair has grown back. Her skin looks good and she is using lotion with vitamin e. She has a follow up with Dr. Jana Hakim in March.   Physical Exam:  Filed Vitals:   11/10/14 1551  BP: 126/67  Pulse: 88  Temp: 97.6 F (36.4 C)  Weight: 193 lb 11.2 oz (87.862 kg)  SpO2: 96%  Skin is well healed. No hyperpigmentation  IMPRESSION: Alexandria Bradley is a 54 y.o. female s/p radiation with resolving acute effects of treatment  PLAN:  She is doing well. We discussed the need for follow up every 4-6 months which she has scheduled.  We discussed the need for yearly mammograms which she can schedule with her OBGYN or with medical oncology. We discussed the need for sun protection in the treated area.  She can always call me with questions.  I will follow up with her on an as needed basis.      [name], MD

## 2014-12-12 ENCOUNTER — Other Ambulatory Visit (HOSPITAL_BASED_OUTPATIENT_CLINIC_OR_DEPARTMENT_OTHER): Payer: 59

## 2014-12-12 DIAGNOSIS — C50212 Malignant neoplasm of upper-inner quadrant of left female breast: Secondary | ICD-10-CM

## 2014-12-12 LAB — COMPREHENSIVE METABOLIC PANEL (CC13)
ALT: 49 U/L (ref 0–55)
AST: 29 U/L (ref 5–34)
Albumin: 4.2 g/dL (ref 3.5–5.0)
Alkaline Phosphatase: 83 U/L (ref 40–150)
Anion Gap: 12 mEq/L — ABNORMAL HIGH (ref 3–11)
BUN: 13.3 mg/dL (ref 7.0–26.0)
CALCIUM: 10 mg/dL (ref 8.4–10.4)
CHLORIDE: 104 meq/L (ref 98–109)
CO2: 25 meq/L (ref 22–29)
Creatinine: 0.7 mg/dL (ref 0.6–1.1)
EGFR: >90 mL/min/{1.73_m2} (ref >90)
GLUCOSE: 104 mg/dL (ref 70–140)
POTASSIUM: 3.8 meq/L (ref 3.5–5.1)
Sodium: 141 mEq/L (ref 136–145)
TOTAL PROTEIN: 7 g/dL (ref 6.4–8.3)
Total Bilirubin: 0.45 mg/dL (ref 0.20–1.20)

## 2014-12-12 LAB — CBC WITH DIFFERENTIAL/PLATELET
BASO%: 1 % (ref 0.0–2.0)
Basophils Absolute: 0.1 10*3/uL (ref 0.0–0.1)
EOS%: 3.9 % (ref 0.0–7.0)
Eosinophils Absolute: 0.2 10*3/uL (ref 0.0–0.5)
HCT: 39.5 % (ref 34.8–46.6)
HGB: 13 g/dL (ref 11.6–15.9)
LYMPH%: 22.7 % (ref 14.0–49.7)
MCH: 30.2 pg (ref 25.1–34.0)
MCHC: 33 g/dL (ref 31.5–36.0)
MCV: 91.5 fL (ref 79.5–101.0)
MONO#: 0.6 10*3/uL (ref 0.1–0.9)
MONO%: 11.8 % (ref 0.0–14.0)
NEUT#: 3.2 10*3/uL (ref 1.5–6.5)
NEUT%: 60.6 % (ref 38.4–76.8)
Platelets: 330 10*3/uL (ref 145–400)
RBC: 4.32 10*6/uL (ref 3.70–5.45)
RDW: 13.7 % (ref 11.2–14.5)
WBC: 5.2 10*3/uL (ref 3.9–10.3)
lymph#: 1.2 10*3/uL (ref 0.9–3.3)

## 2014-12-14 ENCOUNTER — Other Ambulatory Visit: Payer: 59

## 2014-12-18 NOTE — Progress Notes (Signed)
Alexandria Bradley  Telephone:([phone] [phone]     ID: Alexandria Bradley OB: 03/16/1961  MR#: [id number]  YIF#:[id number]  PCP: Lovenia Kim, MD GYN:   SU: [name] OTHER MD: [name], Delrae Rend, Fabio Asa  CHIEF COMPLAINT: Triple negative breast cancer  CURRENT TREATMENT: Adjuvant radiation  BREAST CANCER HISTORY: From the original intake note of 02/16/2014:  Alexandria Bradley palpated a mass in her left breast early May and brought it immediately to her gynecologist attention. He set her up for bilateral diagnostic mammography and left ultrasonography at [facility] 02/07/2014. Mammography showed a 1.3 cm oval mass with spiculated margins in the left breast at the 11:00 position. This was palpable. Ultrasound showed a 1.1 cm tall her than wide mass with a microlobulated margins. He was hypoechoic. There were no abnormalities noted in the left axilla.  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 Lyndon Code at conference 02/16/2014), triple negative, with an MIB-1 of 50%.  The patient's subsequent history is as detailed below  INTERVAL HISTORY: Alexandria Bradley returns today for follow up of her breast cancer, accompanied by her husband [name]. After completing her radiation she thought she would bounce back pretty quick and in fact get back to the way she was before. Instead she developed significant back and left hip pain. She has had a "shot" in the left hip area which helps some and she is having physical therapy under Dr. Berenice Primas group twice a week. That is helping some. She still has some changes in her vision, still feels much more fatigued than she would like (she works 4 days a week in the last 2 days are hard for her), she has had some seasonal allergy problems, still has some occasional diarrhea, and as far as the mood is  concerned she is frustrated.  REVIEW OF SYSTEMS: Alexandria Bradley has not had any problems with fever, rash, or bleeding. Her appetite and sense of taste are normal for her. She is not sure she likes her hair, which has come in a little salt-and-pepper and curly. A detailed review of systems today was otherwise stable  PAST MEDICAL HISTORY: Past Medical History  Diagnosis Date  . Thyroid disease   . Osteoporosis   . Psoriasis   . Breast cancer   . Arthritis   . Back pain   . Hypothyroidism   . Anxiety   . Breast cancer     left  . Hypertension   . PONV (postoperative nausea and vomiting)   . Hypercholesterolemia   . PVC's (premature ventricular contractions)   . Kidney stones   . Headache(784.0)     PMH : migraines  . Radiation 08/18/14-10/07/14    Left breast    PAST SURGICAL HISTORY: Past Surgical History  Procedure Laterality Date  . Cholecystectomy    . Tonsillectomy    . Tubal ligation    . Myomectomy    . Lipoma excision    . Portacath placement N/A 03/01/2014    Procedure: INSERTION PORT-A-CATH;  Surgeon: [name], MD;  Location: [facility];  Service: General;  Laterality: N/A;  . Colonoscopy N/A 06/27/2014    Procedure: COLONOSCOPY;  Surgeon: Lear Ng, MD;  Location: [facility];  Service: Endoscopy;  Laterality: N/A;    FAMILY HISTORY Family History  Problem Relation Age of Onset  . Endometrial cancer Mother   . Lung  cancer Father   . Brain cancer Father    the patient's father died at the age of 28 from what may have been metastatic lung cancer. The patient knows very little about the father's side of her family. Her mother is alive at age 87. She was recently diagnosed with endometrial cancer. The patient has one brother, 2 sisters. There is no history of breast or ovarian cancer in the family.  GYNECOLOGIC HISTORY:  Menarche age 60, first live birth age 65. The patient is GX P3. One child died shortly after birth. She stopped  having periods approximately 2005. She did not use hormone replacement. 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 Her husband [name] is a Insurance underwriter. Son [name] is  Nurse, adult in [place]. Daughter [name] lives in [place] and works for [employer] as an Therapist, sports.    ADVANCED DIRECTIVES: Not in place   HEALTH MAINTENANCE: History  Substance Use Topics  . Smoking status: Former Smoker    Types: Cigarettes  . Smokeless tobacco: Never Used     Comment: Quit smoking cigarettes in 2002  . Alcohol Use: Yes     Comment: social     Colonoscopy: 2010  PAP: 2013  Bone density: 05/15/2009, at [facility], normal, lowest T score -0.8  Lipid panel:  Allergies  Allergen Reactions  . Ciprofloxacin Anxiety and Rash  . Prednisone Shortness Of Breath and Other (See Comments)    "jacks me up" jittery  . Codeine Nausea Only and Other (See Comments)    Nausea   . Cortisone Swelling and Other (See Comments)    "jack me up"    Current Outpatient Prescriptions  Medication Sig Dispense Refill  . acetaminophen (TYLENOL) 500 MG tablet Take 1,000 mg by mouth every 6 (six) hours as needed for headache.     . albuterol (PROAIR HFA) 108 (90 BASE) MCG/ACT inhaler Inhale into the lungs.    Marland Kitchen amLODipine (NORVASC) 5 MG tablet Take 5 mg by mouth daily.     . clonazePAM (KLONOPIN) 0.5 MG tablet Take 0.5 mg by mouth 2 (two) times daily as needed for anxiety.    Marland Kitchen liothyronine (CYTOMEL) 5 MCG tablet Take 5 mcg by mouth daily.     . Probiotic Product (SOLUBLE FIBER/PROBIOTICS PO) Take 1 capsule by mouth daily.    Marland Kitchen SYNTHROID 137 MCG tablet Take 137 mcg by mouth daily before breakfast.     . triamterene-hydrochlorothiazide (MAXZIDE-25) 37.5-25 MG per tablet Take 1 tablet by mouth daily.     Marland Kitchen zolpidem (AMBIEN) 5 MG tablet Take 1 tablet (5 mg total) by mouth at bedtime as needed  for sleep. Do not use in conjunction with Klonipin or Ativan (Patient not taking: Reported on 11/10/2014) 30 tablet 0   No current facility-administered medications for this visit.    OBJECTIVE: Middle-aged white woman in no acute distress  Filed Vitals:   12/19/14 1510  BP: 138/85  Pulse: 67  Temp: 97.8 F (36.6 C)  Resp: 18     Body mass index is 32.63 kg/(m^2).    ECOG FS:1 - Symptomatic but completely ambulatory  Sclerae unicteric, pupils round and equal Oropharynx clear, teeth in good repair No cervical or supraclavicular adenopathy Lungs no rales or rhonchi Heart regular rate and rhythm Abd soft, nontender, positive bowel sounds MSK no focal spinal tenderness, no upper extremity lymphedema Neuro: nonfocal,  well oriented, frustrated affect Breasts: The right breast is unremarkable. The left breast is status post lumpectomy and radiation. There is no evidence of local recurrence. The left axilla is benign.   LAB RESULTS: I No results found for: SPEP  Lab Results  Component Value Date   WBC 5.2 12/12/2014   NEUTROABS 3.2 12/12/2014   HGB 13.0 12/12/2014   HCT 39.5 12/12/2014   MCV 91.5 12/12/2014   PLT 330 12/12/2014      Chemistry      Component Value Date/Time   NA 141 12/12/2014 0948   NA 140 03/13/2009 2325   K 3.8 12/12/2014 0948   K 3.7 03/13/2009 2325   CL 105 03/13/2009 2325   CO2 25 12/12/2014 0948   CO2 29 03/13/2009 2325   BUN 13.3 12/12/2014 0948   BUN 12 03/13/2009 2325   CREATININE 0.7 12/12/2014 0948   CREATININE 0.76 03/13/2009 2325      Component Value Date/Time   CALCIUM 10.0 12/12/2014 0948   CALCIUM 9.4 03/13/2009 2325   ALKPHOS 83 12/12/2014 0948   ALKPHOS 131* 03/13/2009 2325   AST 29 12/12/2014 0948   AST 136* 03/13/2009 2325   ALT 49 12/12/2014 0948   ALT 303* 03/13/2009 2325   BILITOT 0.45 12/12/2014 0948   BILITOT 0.5 03/13/2009 2325       No results found for: LABCA2  No components found for: [id number]  No results  for input(s): INR in the last 168 hours.  Urinalysis    Component Value Date/Time   COLORURINE YELLOW 03/13/2009 2251   APPEARANCEUR CLOUDY* 03/13/2009 2251   LABSPEC 1.020 06/15/2014 1101   LABSPEC 1.018 03/13/2009 2251   PHURINE 5.5 03/13/2009 2251   GLUCOSEU Negative 06/15/2014 1101   GLUCOSEU NEGATIVE 03/13/2009 2251   HGBUR NEGATIVE 03/13/2009 2251   BILIRUBINUR NEGATIVE 03/13/2009 2251   KETONESUR NEGATIVE 03/13/2009 2251   PROTEINUR NEGATIVE 03/13/2009 2251   UROBILINOGEN 0.2 06/15/2014 1101   UROBILINOGEN 0.2 03/13/2009 2251   NITRITE Negative 06/15/2014 1101   NITRITE NEGATIVE 03/13/2009 2251   LEUKOCYTESUR SMALL* 03/13/2009 2251    STUDIES: No results found.   ASSESSMENT: 54 y.o. [place] woman status post left breast biopsy 02/08/2014 for a clinical T1c N0, stage IA invasive ductal carcinoma, grade 3, triple negative, with an MIB-1 of 50%.  (1) status post left lumpectomy and sentinel lymph node sampling 03/01/2014 for a pT1c pN0, stage IA invasive ductal carcinoma, grade 3, with negative margins, repeat prognostic panel again triple negative.  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  Left breast with breath hold technique/ 45 Gy at 1.8 Gy per fraction x 25 fractions.   Left breast boost/ 16 Gy at 2 Gy per fraction x 8 fractions  (4) skin rash--resolved  (5) question of sleep apnea  (6) transverse colon lesion noted on CT scan from 06/17/2014 - negative biopsy, also showed fatty liver  PLAN: Alexandria Bradley is understandably  frustrated that she is not getting back to her normal quicker. It would be helpful if she could exercise more, but she is a bit limited right now as far as walking is  concerned and she does not belong to a gym. I gave her a copy of the "live strong" pamphlet and if she does participate in that she can start doing the stationary bike 4 cardio and some machines for toning without injuring her back or hip.  Many of her symptoms including skin dryness, some of the joint stiffness, problems with visual accommodation, etc. will resolve over the next several months. Similarly the "brain fogginess" or "chemo brain" that patients notice, which is possibly part due to chemotherapy, part grief reaction and part posttraumatic stress. It is very hard to distant angle all this. Nevertheless in my experience within 3 years of completing treatment the patient is who they're going to be and they're generally very satisfied with the person they have become..  I strongly recommend that she participated in the "finding your new normal" group.  Today we flushed her port. She is already scheduled to have this removed in about 6 weeks. She will see me again in 3 months. She knows to call for any problems that may develop before her next visit here.  12/19/2014 3:48 PM  Chauncey Cruel, MD

## 2014-12-19 ENCOUNTER — Ambulatory Visit (HOSPITAL_BASED_OUTPATIENT_CLINIC_OR_DEPARTMENT_OTHER): Payer: 59 | Admitting: Oncology

## 2014-12-19 ENCOUNTER — Telehealth: Payer: Self-pay | Admitting: Oncology

## 2014-12-19 VITALS — BP 138/85 | HR 67 | Temp 97.8°F | Resp 18 | Ht 65.0 in | Wt 196.1 lb

## 2014-12-19 DIAGNOSIS — Z171 Estrogen receptor negative status [ER-]: Secondary | ICD-10-CM

## 2014-12-19 DIAGNOSIS — I10 Essential (primary) hypertension: Secondary | ICD-10-CM

## 2014-12-19 DIAGNOSIS — Z452 Encounter for adjustment and management of vascular access device: Secondary | ICD-10-CM

## 2014-12-19 DIAGNOSIS — K760 Fatty (change of) liver, not elsewhere classified: Secondary | ICD-10-CM

## 2014-12-19 DIAGNOSIS — R5383 Other fatigue: Secondary | ICD-10-CM | POA: Diagnosis not present

## 2014-12-19 DIAGNOSIS — C50212 Malignant neoplasm of upper-inner quadrant of left female breast: Secondary | ICD-10-CM

## 2014-12-19 MED ORDER — HEPARIN SOD (PORK) LOCK FLUSH 100 UNIT/ML IV SOLN
500.0000 [IU] | Freq: Once | INTRAVENOUS | Status: AC
  Administered 2014-12-19: 500 [IU] via INTRAVENOUS
  Filled 2014-12-19: qty 5

## 2014-12-19 MED ORDER — SODIUM CHLORIDE 0.9 % IJ SOLN
10.0000 mL | INTRAMUSCULAR | Status: DC | PRN
  Administered 2014-12-19: 10 mL via INTRAVENOUS
  Filled 2014-12-19: qty 10

## 2014-12-19 NOTE — Telephone Encounter (Signed)
per pof to sch pt appt-gave pt copy of sch °

## 2014-12-19 NOTE — Patient Instructions (Signed)

## 2014-12-21 ENCOUNTER — Other Ambulatory Visit: Payer: Self-pay | Admitting: Nurse Practitioner

## 2015-01-30 ENCOUNTER — Encounter (HOSPITAL_BASED_OUTPATIENT_CLINIC_OR_DEPARTMENT_OTHER): Payer: Self-pay | Admitting: *Deleted

## 2015-01-30 NOTE — Progress Notes (Signed)
Coming for pac out-needs istat

## 2015-02-03 ENCOUNTER — Encounter (HOSPITAL_BASED_OUTPATIENT_CLINIC_OR_DEPARTMENT_OTHER): Payer: Self-pay | Admitting: *Deleted

## 2015-02-03 ENCOUNTER — Ambulatory Visit (HOSPITAL_BASED_OUTPATIENT_CLINIC_OR_DEPARTMENT_OTHER)
Admission: RE | Admit: 2015-02-03 | Discharge: 2015-02-03 | Disposition: A | Discharge status: Home / Self Care | Payer: 59 | Source: Ambulatory Visit | Attending: General Surgery | Admitting: General Surgery

## 2015-02-03 ENCOUNTER — Encounter (HOSPITAL_BASED_OUTPATIENT_CLINIC_OR_DEPARTMENT_OTHER)
Admission: RE | Disposition: A | Discharge status: Home / Self Care | Payer: Self-pay | Source: Ambulatory Visit | Attending: General Surgery

## 2015-02-03 ENCOUNTER — Ambulatory Visit (HOSPITAL_BASED_OUTPATIENT_CLINIC_OR_DEPARTMENT_OTHER): Payer: 59 | Admitting: Anesthesiology

## 2015-02-03 DIAGNOSIS — Z87891 Personal history of nicotine dependence: Secondary | ICD-10-CM | POA: Insufficient documentation

## 2015-02-03 DIAGNOSIS — Z86010 Personal history of colonic polyps: Secondary | ICD-10-CM | POA: Diagnosis not present

## 2015-02-03 DIAGNOSIS — Z888 Allergy status to other drugs, medicaments and biological substances status: Secondary | ICD-10-CM | POA: Diagnosis not present

## 2015-02-03 DIAGNOSIS — Z881 Allergy status to other antibiotic agents status: Secondary | ICD-10-CM | POA: Insufficient documentation

## 2015-02-03 DIAGNOSIS — C50912 Malignant neoplasm of unspecified site of left female breast: Secondary | ICD-10-CM | POA: Diagnosis present

## 2015-02-03 DIAGNOSIS — Z886 Allergy status to analgesic agent status: Secondary | ICD-10-CM | POA: Insufficient documentation

## 2015-02-03 DIAGNOSIS — E780 Pure hypercholesterolemia: Secondary | ICD-10-CM | POA: Insufficient documentation

## 2015-02-03 DIAGNOSIS — F419 Anxiety disorder, unspecified: Secondary | ICD-10-CM | POA: Insufficient documentation

## 2015-02-03 DIAGNOSIS — I10 Essential (primary) hypertension: Secondary | ICD-10-CM | POA: Diagnosis not present

## 2015-02-03 HISTORY — PX: PORT-A-CATH REMOVAL: SHX5289

## 2015-02-03 SURGERY — REMOVAL PORT-A-CATH
Anesthesia: General

## 2015-02-03 MED ORDER — LACTATED RINGERS IV SOLN
INTRAVENOUS | Status: DC
  Administered 2015-02-03: 12:00:00 via INTRAVENOUS

## 2015-02-03 MED ORDER — MIDAZOLAM HCL 2 MG/2ML IJ SOLN
INTRAMUSCULAR | Status: AC
  Filled 2015-02-03: qty 2

## 2015-02-03 MED ORDER — FENTANYL CITRATE (PF) 100 MCG/2ML IJ SOLN
INTRAMUSCULAR | Status: AC
  Filled 2015-02-03: qty 4

## 2015-02-03 MED ORDER — LIDOCAINE HCL (CARDIAC) 20 MG/ML IV SOLN
INTRAVENOUS | Status: DC | PRN
  Administered 2015-02-03: 80 mg via INTRAVENOUS

## 2015-02-03 MED ORDER — FENTANYL CITRATE (PF) 100 MCG/2ML IJ SOLN
INTRAMUSCULAR | Status: AC
  Filled 2015-02-03: qty 2

## 2015-02-03 MED ORDER — ONDANSETRON HCL 4 MG/2ML IJ SOLN
INTRAMUSCULAR | Status: DC | PRN
  Administered 2015-02-03: 4 mg via INTRAVENOUS

## 2015-02-03 MED ORDER — BUPIVACAINE HCL (PF) 0.25 % IJ SOLN
INTRAMUSCULAR | Status: DC | PRN
  Administered 2015-02-03: 10 mL

## 2015-02-03 MED ORDER — MIDAZOLAM HCL 5 MG/5ML IJ SOLN
INTRAMUSCULAR | Status: DC | PRN
  Administered 2015-02-03: 2 mg via INTRAVENOUS

## 2015-02-03 MED ORDER — BUPIVACAINE HCL (PF) 0.25 % IJ SOLN
INTRAMUSCULAR | Status: AC
  Filled 2015-02-03: qty 30

## 2015-02-03 MED ORDER — LACTATED RINGERS IV SOLN
INTRAVENOUS | Status: DC
  Administered 2015-02-03: 13:00:00 via INTRAVENOUS

## 2015-02-03 MED ORDER — FENTANYL CITRATE (PF) 100 MCG/2ML IJ SOLN
INTRAMUSCULAR | Status: DC | PRN
  Administered 2015-02-03: 100 ug via INTRAVENOUS

## 2015-02-03 MED ORDER — PROPOFOL 10 MG/ML IV BOLUS
INTRAVENOUS | Status: DC | PRN
  Administered 2015-02-03: 200 mg via INTRAVENOUS

## 2015-02-03 SURGICAL SUPPLY — 30 items
BLADE SURG 15 STRL LF DISP TIS (BLADE) ×1 IMPLANT
BLADE SURG 15 STRL SS (BLADE) ×3
CHLORAPREP W/TINT 26ML (MISCELLANEOUS) ×3 IMPLANT
COVER BACK TABLE 60X90IN (DRAPES) ×3 IMPLANT
COVER MAYO STAND STRL (DRAPES) ×3 IMPLANT
DECANTER SPIKE VIAL GLASS SM (MISCELLANEOUS) ×3 IMPLANT
DRAPE LAPAROTOMY 100X72 PEDS (DRAPES) ×3 IMPLANT
DRAPE UTILITY XL STRL (DRAPES) ×3 IMPLANT
ELECT COATED BLADE 2.86 ST (ELECTRODE) ×3 IMPLANT
ELECT REM PT RETURN 9FT ADLT (ELECTROSURGICAL) ×3
ELECTRODE REM PT RTRN 9FT ADLT (ELECTROSURGICAL) ×1 IMPLANT
GLOVE BIO SURGEON STRL SZ7 (GLOVE) ×3 IMPLANT
GLOVE BIOGEL PI IND STRL 7.5 (GLOVE) ×1 IMPLANT
GLOVE BIOGEL PI INDICATOR 7.5 (GLOVE) ×2
GOWN STRL REUS W/ TWL LRG LVL3 (GOWN DISPOSABLE) ×3 IMPLANT
GOWN STRL REUS W/TWL LRG LVL3 (GOWN DISPOSABLE) ×9
LIQUID BAND (GAUZE/BANDAGES/DRESSINGS) ×3 IMPLANT
MARKER SKIN DUAL TIP RULER LAB (MISCELLANEOUS) ×3 IMPLANT
NDL HYPO 25X1 1.5 SAFETY (NEEDLE) ×1 IMPLANT
NEEDLE HYPO 25X1 1.5 SAFETY (NEEDLE) ×3 IMPLANT
PACK BASIN DAY SURGERY FS (CUSTOM PROCEDURE TRAY) ×3 IMPLANT
PENCIL BUTTON HOLSTER BLD 10FT (ELECTRODE) ×3 IMPLANT
SLEEVE SCD COMPRESS KNEE MED (MISCELLANEOUS) ×2 IMPLANT
SUT MNCRL AB 4-0 PS2 18 (SUTURE) ×3 IMPLANT
SUT MON AB 4-0 PC3 18 (SUTURE) ×3 IMPLANT
SUT VIC AB 3-0 SH 27 (SUTURE) ×3
SUT VIC AB 3-0 SH 27X BRD (SUTURE) ×1 IMPLANT
SYR CONTROL 10ML LL (SYRINGE) ×3 IMPLANT
TOWEL OR 17X24 6PK STRL BLUE (TOWEL DISPOSABLE) ×5 IMPLANT
TOWEL OR NON WOVEN STRL DISP B (DISPOSABLE) ×3 IMPLANT

## 2015-02-03 NOTE — Anesthesia Postprocedure Evaluation (Signed)
Anesthesia Post Note  Patient: Alexandria Bradley  Procedure(s) Performed: Procedure(s) (LRB): REMOVAL PORT-A-CATH (N/A)  Anesthesia type: general  Patient location: PACU  Post pain: Pain level controlled  Post assessment: Patient's Cardiovascular Status Stable  Last Vitals:  Filed Vitals:   02/03/15 1405  BP: 149/72  Pulse: 61  Temp: 36.4 C  Resp: 18    Post vital signs: Reviewed and stable  Level of consciousness: awake  Complications: No apparent anesthesia complications

## 2015-02-03 NOTE — Discharge Instructions (Signed)
PORT-A-CATH: POST OP INSTRUCTIONS  Always review your discharge instruction sheet given to you by the facility where your surgery was performed.   1. A prescription for pain medication may be given to you upon discharge. Take your pain medication as prescribed, if needed. If narcotic pain medicine is not needed, then you make take acetaminophen (Tylenol) or ibuprofen (Advil) as needed.  2. Take your usually prescribed medications unless otherwise directed. 3. If you need a refill on your pain medication, please contact our office. All narcotic pain medicine now requires a paper prescription.  Phoned in and fax refills are no longer allowed by law.  Prescriptions will not be filled after 5 pm or on weekends.  4. You should follow a light diet for the remainder of the day after your procedure. 5. Most patients will experience some mild swelling and/or bruising in the area of the incision. It may take several days to resolve. 6. It is common to experience some constipation if taking pain medication after surgery. Increasing fluid intake and taking a stool softener (such as Colace) will usually help or prevent this problem from occurring. A mild laxative (Milk of Magnesia or Miralax) should be taken according to package directions if there are no bowel movements after 48 hours.  7. Unless discharge instructions indicate otherwise, you may remove your bandages 48 hours after surgery, and you may shower at that time. You may have steri-strips (small white skin tapes) in place directly over the incision.  These strips should be left on the skin for 7-10 days.  If your surgeon used Dermabond (skin glue) on the incision, you may shower in 24 hours.  The glue will flake off over the next 2-3 weeks.  8. ACTIVITIES:  Limit activity involving your arms for the next 72 hours. Do no strenuous exercise or activity for 1 week. You may drive when you are no longer taking prescription pain medication, you can  comfortably wear a seatbelt, and you can maneuver your car. 10.You may need to see your doctor in the office for a follow-up appointment.  Please       check with your doctor.   WHEN TO CALL YOUR DOCTOR [phone]): 1. Fever over 101.0 2. Chills 3. Continued bleeding from incision 4. Increased redness and tenderness at the site 5. Shortness of breath, difficulty breathing   The clinic staff is available to answer your questions during regular business hours. Please dont hesitate to call and ask to speak to one of the nurses or medical assistants for clinical concerns. If you have a medical emergency, go to the nearest emergency room or call 911.  A surgeon from [facility] is always on call at the hospital.     For further information, please visit [email or web address]     Post Anesthesia Home Care Instructions  Activity: Get plenty of rest for the remainder of the day. A responsible adult should stay with you for 24 hours following the procedure.  For the next 24 hours, DO NOT: -Drive a car -Paediatric nurse -Drink alcoholic beverages -Take any medication unless instructed by your physician -Make any legal decisions or sign important papers.  Meals: Start with liquid foods such as gelatin or soup. Progress to regular foods as tolerated. Avoid greasy, spicy, heavy foods. If nausea and/or vomiting occur, drink only clear liquids until the nausea and/or vomiting subsides. Call your physician if vomiting continues.  Special Instructions/Symptoms: Your throat may feel dry or sore from the anesthesia  or the breathing tube placed in your throat during surgery. If this causes discomfort, gargle with warm salt water. The discomfort should disappear within 24 hours.  If you had a scopolamine patch placed behind your ear for the management of post- operative nausea and/or vomiting:  1. The medication in the patch is effective for 72 hours, after which it  should be removed.  Wrap patch in a tissue and discard in the trash. Wash hands thoroughly with soap and water. 2. You may remove the patch earlier than 72 hours if you experience unpleasant side effects which may include dry mouth, dizziness or visual disturbances. 3. Avoid touching the patch. Wash your hands with soap and water after contact with the patch.

## 2015-02-03 NOTE — Anesthesia Preprocedure Evaluation (Signed)
Anesthesia Evaluation  Patient identified by MRN, date of birth, ID band Patient awake    Reviewed: Allergy & Precautions, NPO status , Patient's Chart, lab work & pertinent test results  History of Anesthesia Complications (+) PONV  Airway Mallampati: I  TM Distance: >3 FB Neck ROM: Full    Dental   Pulmonary former smoker,    Pulmonary exam normal       Cardiovascular hypertension, Pt. on medications Normal cardiovascular exam    Neuro/Psych Anxiety    GI/Hepatic   Endo/Other    Renal/GU      Musculoskeletal   Abdominal   Peds  Hematology   Anesthesia Other Findings   Reproductive/Obstetrics                             Anesthesia Physical Anesthesia Plan  ASA: II  Anesthesia Plan: General   Post-op Pain Management:    Induction: Intravenous  Airway Management Planned: LMA  Additional Equipment:   Intra-op Plan:   Post-operative Plan: Extubation in OR  Informed Consent: I have reviewed the patients History and Physical, chart, labs and discussed the procedure including the risks, benefits and alternatives for the proposed anesthesia with the patient or authorized representative who has indicated his/her understanding and acceptance.     Plan Discussed with: CRNA and Surgeon  Anesthesia Plan Comments:         Anesthesia Quick Evaluation

## 2015-02-03 NOTE — Anesthesia Procedure Notes (Signed)
Procedure Name: LMA Insertion Date/Time: 02/03/2015 1:05 PM Performed by: Bethena Roys T Pre-anesthesia Checklist: Patient identified, Emergency Drugs available, Suction available and Patient being monitored Patient Re-evaluated:Patient Re-evaluated prior to inductionOxygen Delivery Method: Circle System Utilized Preoxygenation: Pre-oxygenation with 100% oxygen Intubation Type: IV induction Ventilation: Mask ventilation without difficulty LMA: LMA inserted LMA Size: 4.0 Number of attempts: 1 Airway Equipment and Method: Bite block Placement Confirmation: positive ETCO2 Tube secured with: Tape Dental Injury: Teeth and Oropharynx as per pre-operative assessment

## 2015-02-03 NOTE — Op Note (Signed)
Preoperative diagnosis: Breast cancer, no longer needs venous access Postoperative diagnosis: Same as above Procedure: Right subclavian port removal Surgeon: Serita Grammes Anesthesia:  Gen. Per patient choice Estimated blood loss: Minimal Complications: None Specimens: None Drains: None Sponge and needle count was correct at completion Disposition to recovery in stable condition  Procedure: This is a 89 show female who I know from her care for breast cancer. She now desires port removal. We discussed this prior to beginning.  Procedure: After informed consent was obtained the patient was taken to the operating room. She had sequential compression devices on her legs. She was placed under general anesthesia per the patient choice. She was then prepped and draped in standard sterile surgical fashion. A surgical timeout was then performed. I treated marcaine over the old incision. I made an incision. I removed the port and the line in their entirety. I closed this with 3-0 Vicryl and 4-0 Monocryl. Glue was placed. She tolerated this well and was transferred to recovery stable.

## 2015-02-03 NOTE — Transfer of Care (Signed)
Immediate Anesthesia Transfer of Care Note  Patient: Alexandria Bradley  Procedure(s) Performed: Procedure(s): REMOVAL PORT-A-CATH (N/A)  Patient Location: PACU  Anesthesia Type:General  Level of Consciousness: awake and oriented  Airway & Oxygen Therapy: Patient Spontanous Breathing and Patient connected to face mask oxygen  Post-op Assessment: Report given to RN  Post vital signs: Reviewed and stable  Last Vitals:  Filed Vitals:   02/03/15 1203  BP: 123/82  Pulse: 91  Temp: 36.7 C  Resp: 20    Complications: No apparent anesthesia complications

## 2015-02-03 NOTE — Interval H&P Note (Signed)
History and Physical Interval Note:  02/03/2015 12:52 PM  Alexandria Bradley  has presented today for surgery, with the diagnosis of BREAST CANCER  The various methods of treatment have been discussed with the patient and family. After consideration of risks, benefits and other options for treatment, the patient has consented to  Procedure(s): REMOVAL PORT-A-CATH (N/A) as a surgical intervention .  The patient's history has been reviewed, patient examined, no change in status, stable for surgery.  I have reviewed the patient's chart and labs.  Questions were answered to the patient's satisfaction.     Dashon Mcintire

## 2015-02-03 NOTE — H&P (Signed)
  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 She has worked throughout this. She reports some tenderness in her left breast after radiation. Her neuropathy and fatigue are slowly getting better. She has full rom of arm and has no heaviness or swelling. She comes in today to discuss port removal   Other Problems  Back Pain Breast Cancer Chest pain Cholelithiasis Gastric Ulcer Hemorrhoids High blood pressure Hypercholesterolemia Lump In Breast Thyroid Disease  Past Surgical History  Breast Biopsy Left. Breast Mass; Local Excision Left. Colon Polyp Removal - Colonoscopy Gallbladder Surgery - Laparoscopic Sentinel Lymph Node Biopsy Tonsillectomy  Allergies  Ciprofloxacin *CHEMICALS* PredniSONE (Pak) *CORTICOSTEROIDS* Codeine Phosphate *ANALGESICS - OPIOID* Cortisone *CORTICOSTEROIDS*  Medication History  Zolpidem Tartrate (5MG  Tablet, Oral) Active. AmLODIPine Besylate (5MG  Tablet, Oral) Active. ClonazePAM (0.5MG  Tablet, Oral) Active. Klor-Con [facility] Tablet ER, Oral) Active. Lidocaine-Prilocaine (2.5-2.5% Cream, External) Active. Liothyronine Sodium (5MCG Tablet, Oral) Active. ProAir HFA (108 (90 Base)MCG/ACT Aerosol Soln, Inhalation) Active. Synthroid (137MCG Tablet, Oral) Active. Triamterene-HCTZ (37.5-25MG  Tablet, Oral) Active. Hydrocod Polst-CPM Polst ER (10-8MG /5ML Liquid ER, Oral) Active.  Social History  Alcohol use Occasional alcohol use. Caffeine use Coffee, Tea. No drug use Tobacco use Former smoker.  Review of Systems General Not Present- Appetite Loss, Chills, Fatigue, Fever, Night Sweats, Weight Gain and Weight Loss. Skin Not Present- Change in Wart/Mole, Dryness, Hives, Jaundice, New Lesions, Non-Healing Wounds, Rash and Ulcer. HEENT Not Present- Earache, Hearing Loss,  Hoarseness, Nose Bleed, Oral Ulcers, Ringing in the Ears, Seasonal Allergies, Sinus Pain, Sore Throat, Visual Disturbances, Wears glasses/contact lenses and Yellow Eyes. Respiratory Not Present- Bloody sputum, Chronic Cough, Difficulty Breathing, Snoring and Wheezing. Breast Not Present- Breast Mass, Breast Pain, Nipple Discharge and Skin Changes. Cardiovascular Not Present- Chest Pain, Difficulty Breathing Lying Down, Leg Cramps, Palpitations, Rapid Heart Rate, Shortness of Breath and Swelling of Extremities. Gastrointestinal Present- Chronic diarrhea. Not Present- Abdominal Pain, Bloating, Bloody Stool, Change in Bowel Habits, Constipation, Difficulty Swallowing, Excessive gas, Gets full quickly at meals, Hemorrhoids, Indigestion, Nausea, Rectal Pain and Vomiting. Female Genitourinary Not Present- Frequency, Nocturia, Painful Urination, Pelvic Pain and Urgency. Musculoskeletal Present- Back Pain, Joint Pain, Muscle Pain and Muscle Weakness. Not Present- Joint Stiffness and Swelling of Extremities. Neurological Present- Decreased Memory. Not Present- Fainting, Headaches, Numbness, Seizures, Tingling, Tremor, Trouble walking and Weakness. Psychiatric Not Present- Anxiety, Bipolar, Change in Sleep Pattern, Depression, Fearful and Frequent crying. Endocrine Present- Cold Intolerance. Not Present- Excessive Hunger, Hair Changes, Heat Intolerance, Hot flashes and New Diabetes.   Vitals  Weight: 191 lb Height: 64in Body Surface Area: 1.98 m Body Mass Index: 32.78 kg/m Temp.: 76F(Temporal)  Pulse: 78 (Regular)  BP: 126/76 (Sitting, Left Arm, Standard)  Physical Exam  General Mental Status-Alert. Orientation-Oriented X3. Chest and Lung Exam Chest and lung exam reveals -on auscultation, normal breath sounds, no adventitious sounds and normal vocal resonance. Breast Nipples-No Discharge. Breast Lump-No Palpable Breast Mass. Cardiovascular Cardiovascular examination  reveals -normal heart sounds, regular rate and rhythm with no murmurs. Lymphatic Head & Neck General Head & Neck Lymphatics: Bilateral - Description - Normal. Axillary General Axillary Region: Bilateral - Description - Normal. Note: no [place] adenopathy  Assessment & Plan  STAGE I BREAST CANCER, LEFT (174.9  C50.912) Story: Port removal  She is without any evidence of recurrence. We will perform port removal under local mac soon. discussed procedure and recovery

## 2015-02-06 LAB — POCT I-STAT, CHEM 8
BUN: 15 mg/dL (ref 6–20)
CREATININE: 0.7 mg/dL (ref 0.44–1.00)
Calcium, Ion: 1.17 mmol/L (ref 1.12–1.23)
Chloride: 105 mmol/L (ref 101–111)
Glucose, Bld: 112 mg/dL — ABNORMAL HIGH (ref 70–99)
HCT: 44 % (ref 36.0–46.0)
HEMOGLOBIN: 15 g/dL (ref 12.0–15.0)
POTASSIUM: 4.1 mmol/L (ref 3.5–5.1)
SODIUM: 140 mmol/L (ref 135–145)
TCO2: 22 mmol/L (ref 0–100)

## 2015-02-07 ENCOUNTER — Encounter (HOSPITAL_BASED_OUTPATIENT_CLINIC_OR_DEPARTMENT_OTHER): Payer: Self-pay | Admitting: General Surgery

## 2015-02-14 ENCOUNTER — Other Ambulatory Visit: Payer: Self-pay | Admitting: Radiology

## 2015-02-24 ENCOUNTER — Other Ambulatory Visit: Payer: Self-pay | Admitting: Internal Medicine

## 2015-02-24 DIAGNOSIS — E041 Nontoxic single thyroid nodule: Secondary | ICD-10-CM

## 2015-03-01 ENCOUNTER — Other Ambulatory Visit: Payer: 59

## 2015-03-13 DIAGNOSIS — R7989 Other specified abnormal findings of blood chemistry: Secondary | ICD-10-CM | POA: Insufficient documentation

## 2015-03-20 ENCOUNTER — Ambulatory Visit
Admission: RE | Admit: 2015-03-20 | Discharge: 2015-03-20 | Disposition: A | Discharge status: Home / Self Care | Payer: 59 | Source: Ambulatory Visit | Attending: Internal Medicine | Admitting: Internal Medicine

## 2015-03-20 DIAGNOSIS — E041 Nontoxic single thyroid nodule: Secondary | ICD-10-CM

## 2015-03-28 ENCOUNTER — Other Ambulatory Visit: Payer: Self-pay | Admitting: Family Medicine

## 2015-03-28 DIAGNOSIS — M25551 Pain in right hip: Secondary | ICD-10-CM

## 2015-03-31 ENCOUNTER — Other Ambulatory Visit: Payer: Self-pay

## 2015-03-31 DIAGNOSIS — C50212 Malignant neoplasm of upper-inner quadrant of left female breast: Secondary | ICD-10-CM

## 2015-04-03 NOTE — Progress Notes (Signed)
[facility]  [phone] [phone]) [phone]     ID: [name] OB: 1961/01/15  MR#: [id number]  IWL#:[id number]  PCP: Bartholome Bill, MD GYN:  Brien Few MDSU: [name] OTHER MD: [name], Delrae Rend, Wilford Corner, Dorna Leitz  CHIEF COMPLAINT: Triple negative breast cancer  CURRENT TREATMENT: Observation  BREAST CANCER HISTORY: From the original intake note of 02/16/2014:  [name] palpated a mass in her left breast early May and brought it immediately to her gynecologist attention. He set her up for bilateral diagnostic mammography and left ultrasonography at [facility] 02/07/2014. Mammography showed a 1.3 cm oval mass with spiculated margins in the left breast at the 11:00 position. This was palpable. Ultrasound showed a 1.1 cm tall her than wide mass with a microlobulated margins. He was hypoechoic. There were no abnormalities noted in the left axilla.  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 Lyndon Code at conference 02/16/2014), triple negative, with an MIB-1 of 50%.  The patient's subsequent history is as detailed below  INTERVAL HISTORY: Erminie returns today for follow up of her breast cancer. The interval history is generally unremarkable and she is doing "good". She was just started on metformin by Dr. Buddy Duty. She is tolerating it well. She tells me she needs to change her diet. She was given a Location manager pamphlet last visit but she never did start that exercise program. She is considering it.   REVIEW OF SYSTEMS: What is bothering her is her left hip. It hurts her after she sits for a while and it also hurts her when she stands for a while. She gets occasional headaches that can wake her up at night. This is rare however, perhaps once or at most twice a week. Meanwhile her brain "fogginess" is much  improved. She is not able to work and concentrate much better than before. A detailed review of systems was otherwise noncontributory   PAST MEDICAL HISTORY: Past Medical History  Diagnosis Date  . Thyroid disease   . Osteoporosis   . Psoriasis   . Breast cancer   . Arthritis   . Back pain   . Hypothyroidism   . Anxiety   . Breast cancer     left  . Hypertension   . PONV (postoperative nausea and vomiting)   . Hypercholesterolemia   . PVC's (premature ventricular contractions)   . Kidney stones   . Headache(784.0)     PMH : migraines  . Radiation 08/18/14-10/07/14    Left breast    PAST SURGICAL HISTORY: Past Surgical History  Procedure Laterality Date  . Cholecystectomy    . Tonsillectomy    . Tubal ligation    . Myomectomy    . Lipoma excision    . Portacath placement N/A 03/01/2014    Procedure: INSERTION PORT-A-CATH;  Surgeon: [name], MD;  Location: [place];  Service: General;  Laterality: N/A;  . Colonoscopy N/A 06/27/2014    Procedure: COLONOSCOPY;  Surgeon: Lear Ng, MD;  Location: [place];  Service: Endoscopy;  Laterality: N/A;  . Breast lumpectomy with axillary lymph node biopsy  6/15    left  . Port-a-cath removal N/A 02/03/2015    Procedure: REMOVAL PORT-A-CATH;  Surgeon: [name], MD;  Location: [place];  Service: General;  Laterality: N/A;    FAMILY HISTORY Family History  Problem Relation Age of  Onset  . Endometrial cancer Mother   . Lung cancer Father   . Brain cancer Father    the patient's father died at the age of 37 from what may have been metastatic lung cancer. The patient knows very little about the father's side of her family. Her mother is alive at age 44. She was recently diagnosed with endometrial cancer. The patient has one brother, 2 sisters. There is no history of breast or ovarian cancer in the family.  GYNECOLOGIC HISTORY:  Menarche age 19, first live birth age 62. The  patient is GX P3. One child died shortly after birth. She stopped having periods approximately 2005. She did not use hormone replacement. 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 Her husband [name] is a Insurance underwriter. Son [name] is  Nurse, adult in [place]. Daughter [name] lives in [place] and works for [employer] as an Therapist, sports.    ADVANCED DIRECTIVES: Not in place   HEALTH MAINTENANCE: History  Substance Use Topics  . Smoking status: Former Smoker    Types: Cigarettes  . Smokeless tobacco: Never Used     Comment: Quit smoking cigarettes in 2002  . Alcohol Use: Yes     Comment: social     Colonoscopy: 2010  PAP: 2013  Bone density: 05/15/2009, at [facility], normal, lowest T score -0.8  Lipid panel:  Allergies  Allergen Reactions  . Ciprofloxacin Anxiety and Rash  . Prednisone Shortness Of Breath and Other (See Comments)    "jacks me up" jittery  . Codeine Nausea Only and Other (See Comments)    Nausea   . Cortisone Swelling and Other (See Comments)    "jack me up"  . Chlorhexidine Itching    Current Outpatient Prescriptions  Medication Sig Dispense Refill  . acetaminophen (TYLENOL) 500 MG tablet Take 1,000 mg by mouth every 6 (six) hours as needed for headache.     . albuterol (PROAIR HFA) 108 (90 BASE) MCG/ACT inhaler Inhale into the lungs.    Marland Kitchen amLODipine (NORVASC) 5 MG tablet Take 5 mg by mouth daily.     . clonazePAM (KLONOPIN) 0.5 MG tablet Take 0.5 mg by mouth 2 (two) times daily as needed for anxiety.    Marland Kitchen liothyronine (CYTOMEL) 5 MCG tablet Take 5 mcg by mouth daily.     . metFORMIN (GLUCOPHAGE) 500 MG tablet Take 2 tablets (1,000 mg total) by mouth 2 (two) times daily with a meal.    . Probiotic Product (SOLUBLE FIBER/PROBIOTICS PO) Take 1 capsule by mouth daily.    Marland Kitchen SYNTHROID 137 MCG tablet Take 125 mcg by mouth daily before  breakfast.    . triamterene-hydrochlorothiazide (MAXZIDE-25) 37.5-25 MG per tablet Take 1 tablet by mouth daily.      No current facility-administered medications for this visit.    OBJECTIVE: Middle-aged white woman who appears stated age  82 Vitals:   04/04/15 1426  BP: 130/63  Pulse: 57  Temp: 98.2 F (36.8 C)  Resp: 18     Body mass index is 31.12 kg/(m^2).    ECOG FS:1 - Symptomatic but completely ambulatory  Sclerae unicteric, EOMs intact Oropharynx clear, dentition in good repair No cervical or supraclavicular adenopathy Lungs no rales or rhonchi Heart regular rate and rhythm Abd soft, nontender, positive bowel sounds MSK no focal spinal tenderness, no upper extremity lymphedema Neuro: nonfocal, well oriented, appropriate affect  Breasts: The right breast is unremarkable. The left breast is status post lumpectomy and radiation. There is no evidence of local recurrence. The left axilla is benign.   LAB RESULTS: I No results found for: SPEP  Lab Results  Component Value Date   WBC 6.4 04/04/2015   NEUTROABS 4.0 04/04/2015   HGB 13.4 04/04/2015   HCT 39.1 04/04/2015   MCV 90.2 04/04/2015   PLT 300 04/04/2015      Chemistry      Component Value Date/Time   NA 139 04/04/2015 1356   NA 140 02/03/2015 1211   K 3.4* 04/04/2015 1356   K 4.1 02/03/2015 1211   CL 105 02/03/2015 1211   CO2 25 04/04/2015 1356   CO2 29 03/13/2009 2325   BUN 14.5 04/04/2015 1356   BUN 15 02/03/2015 1211   CREATININE 0.8 04/04/2015 1356   CREATININE 0.70 02/03/2015 1211      Component Value Date/Time   CALCIUM 10.2 04/04/2015 1356   CALCIUM 9.4 03/13/2009 2325   ALKPHOS 92 04/04/2015 1356   ALKPHOS 131* 03/13/2009 2325   AST 31 04/04/2015 1356   AST 136* 03/13/2009 2325   ALT 51 04/04/2015 1356   ALT 303* 03/13/2009 2325   BILITOT 0.52 04/04/2015 1356   BILITOT 0.5 03/13/2009 2325       No results found for: LABCA2  No components found for: YTKZS010  No results for  input(s): INR in the last 168 hours.  Urinalysis    Component Value Date/Time   COLORURINE YELLOW 03/13/2009 2251   APPEARANCEUR CLOUDY* 03/13/2009 2251   LABSPEC 1.020 06/15/2014 1101   LABSPEC 1.018 03/13/2009 2251   PHURINE 6.0 06/15/2014 1101   PHURINE 5.5 03/13/2009 2251   GLUCOSEU Negative 06/15/2014 1101   GLUCOSEU NEGATIVE 03/13/2009 2251   HGBUR Negative 06/15/2014 1101   HGBUR NEGATIVE 03/13/2009 2251   BILIRUBINUR Color Interference 06/15/2014 1101   BILIRUBINUR NEGATIVE 03/13/2009 2251   KETONESUR Negative 06/15/2014 1101   KETONESUR NEGATIVE 03/13/2009 2251   PROTEINUR 30 06/15/2014 1101   PROTEINUR NEGATIVE 03/13/2009 2251   UROBILINOGEN 0.2 06/15/2014 1101   UROBILINOGEN 0.2 03/13/2009 2251   NITRITE Negative 06/15/2014 1101   NITRITE NEGATIVE 03/13/2009 2251   LEUKOCYTESUR Trace 06/15/2014 1101   LEUKOCYTESUR SMALL* 03/13/2009 2251    STUDIES: US Soft Tissue Head/neck  03/20/2015   CLINICAL DATA:  Nontoxic uninodular goiter.  EXAM: THYROID ULTRASOUND  TECHNIQUE: Ultrasound examination of the thyroid gland and adjacent soft tissues was performed.  COMPARISON:  Ultrasound of January 18, 2013.  FINDINGS: Right thyroid lobe  Measurements: 4.2 x 1.3 x 1.2 cm.  No nodules visualized.  Left thyroid lobe  Measurements: 4.3 x 1.2 x 1.1 cm. Stable 2 mm hypoechoic nodule is noted in midpole.  Isthmus  Thickness: 3 mm.  No nodules visualized.  Lymphadenopathy  No significantly enlarged adenopathy is noted.  IMPRESSION: Stable 2 mm hypoechoic nodule seen in left thyroid lobe. No significant abnormality seen in the thyroid gland.   Electronically Signed   By: Marijo Conception, M.D.   On: 03/20/2015 11:03     ASSESSMENT: 54 y.o. [place] woman status post left breast biopsy 02/08/2014 for a clinical T1c N0, stage IA invasive ductal carcinoma, grade 3, triple negative, with an MIB-1 of 50%.  (1) status post left lumpectomy and sentinel lymph node sampling 03/01/2014 for a pT1c  pN0, stage IA invasive ductal carcinoma, grade 3, with negative margins, repeat prognostic panel again triple negative.  (2) adjuvant  chemotherapy started a 03/25/2014 consisting of cyclophosphamide and doxorubicin given in dose dense fashion x4, completed 05/06/2014; followed by weekly paclitaxel x1, on 05/27/2014, poorly tolerated;   (a) switched to Abraxane starting 06/10/2014, with 11 Abraxane doses planned  (b) dose reduced by 20% because of neutropenia starting 07/01/2014 dose  (c) discontinued after 4th Abraxane dose 07/15/2014 due to neuropathy  (3) adjuvant radiation completed 10/07/2014.  Left breast with breath hold technique/ 45 Gy at 1.8 Gy per fraction x 25 fractions.   Left breast boost/ 16 Gy at 2 Gy per fraction x 8 fractions  (4) genetics counseling pending  (5) question of sleep apnea  (6) transverse colon lesion noted on CT scan from 06/17/2014 - negative biopsy, also showed fatty liver  PLAN: Arbell is recovering nicely from her prior treatment. He "mental follow" is lifting. This is very encouraging.  She does need to get her diet and exercise program going. I again encouraged her to participate in the [place] program through the wife and they do have a diabetes teaching program there that she may benefit from.  With a triple negative breast cancer in a woman under 29 she qualifies for genetic testing. She has been hesitant because of cost issues but I reassured her that most of our patients these days don't painting out-of-pocket or very little. I went ahead and placed that appointment. In addition her father-in-law had breast cancer and he can be tested here as well if he wishes. I strongly urged to get that done so that she can clarify the genetic profile for her own children and grandchildren.  I reviewed the films from the CT scan obtained last year of the abdomen and pelvis. She understands I'm not a radiologist but I don't see any evidence of cancer in the  left hip area. I strongly urged her to do range of motion exercises and consider yoga here  Otherwise she will see Dr. Donne Hazel in the fall or early winter. She will see [place] again next March. If all goes well at that time we will continue to see her on an every 6 month basis until she completes her 5 years of follow-up  04/04/2015 2:57 PM  Chauncey Cruel, MD

## 2015-04-04 ENCOUNTER — Other Ambulatory Visit (HOSPITAL_BASED_OUTPATIENT_CLINIC_OR_DEPARTMENT_OTHER): Payer: 59

## 2015-04-04 ENCOUNTER — Telehealth: Payer: Self-pay | Admitting: Oncology

## 2015-04-04 ENCOUNTER — Ambulatory Visit (HOSPITAL_BASED_OUTPATIENT_CLINIC_OR_DEPARTMENT_OTHER): Payer: 59 | Admitting: Oncology

## 2015-04-04 VITALS — BP 130/63 | HR 57 | Temp 98.2°F | Resp 18 | Ht 65.0 in | Wt 187.0 lb

## 2015-04-04 DIAGNOSIS — C50212 Malignant neoplasm of upper-inner quadrant of left female breast: Secondary | ICD-10-CM

## 2015-04-04 LAB — CBC WITH DIFFERENTIAL/PLATELET
BASO%: 0.9 % (ref 0.0–2.0)
Basophils Absolute: 0.1 10*3/uL (ref 0.0–0.1)
EOS ABS: 0.2 10*3/uL (ref 0.0–0.5)
EOS%: 3.1 % (ref 0.0–7.0)
HEMATOCRIT: 39.1 % (ref 34.8–46.6)
HGB: 13.4 g/dL (ref 11.6–15.9)
LYMPH%: 23.8 % (ref 14.0–49.7)
MCH: 30.9 pg (ref 25.1–34.0)
MCHC: 34.3 g/dL (ref 31.5–36.0)
MCV: 90.2 fL (ref 79.5–101.0)
MONO#: 0.6 10*3/uL (ref 0.1–0.9)
MONO%: 9.3 % (ref 0.0–14.0)
NEUT#: 4 10*3/uL (ref 1.5–6.5)
NEUT%: 62.9 % (ref 38.4–76.8)
PLATELETS: 300 10*3/uL (ref 145–400)
RBC: 4.33 10*6/uL (ref 3.70–5.45)
RDW: 13 % (ref 11.2–14.5)
WBC: 6.4 10*3/uL (ref 3.9–10.3)
lymph#: 1.5 10*3/uL (ref 0.9–3.3)

## 2015-04-04 LAB — COMPREHENSIVE METABOLIC PANEL (CC13)
ALK PHOS: 92 U/L (ref 40–150)
ALT: 51 U/L (ref 0–55)
ANION GAP: 12 meq/L — AB (ref 3–11)
AST: 31 U/L (ref 5–34)
Albumin: 4.6 g/dL (ref 3.5–5.0)
BUN: 14.5 mg/dL (ref 7.0–26.0)
CO2: 25 meq/L (ref 22–29)
CREATININE: 0.8 mg/dL (ref 0.6–1.1)
Calcium: 10.2 mg/dL (ref 8.4–10.4)
Chloride: 102 mEq/L (ref 98–109)
EGFR: 88 mL/min/{1.73_m2} — AB (ref >90)
GLUCOSE: 92 mg/dL (ref 70–140)
Potassium: 3.4 mEq/L — ABNORMAL LOW (ref 3.5–5.1)
Sodium: 139 mEq/L (ref 136–145)
Total Bilirubin: 0.52 mg/dL (ref 0.20–1.20)
Total Protein: 7.3 g/dL (ref 6.4–8.3)

## 2015-04-04 NOTE — Telephone Encounter (Signed)
Appointments made and avs printed for patient °

## 2015-04-20 ENCOUNTER — Telehealth: Payer: Self-pay | Admitting: Nurse Practitioner

## 2015-04-20 NOTE — Telephone Encounter (Signed)
Patient called and stated she does not want to keep the genetics appointment nor to reschedule

## 2015-04-24 ENCOUNTER — Other Ambulatory Visit: Payer: 59

## 2015-04-24 ENCOUNTER — Encounter: Payer: 59 | Admitting: Genetic Counselor

## 2015-05-01 ENCOUNTER — Other Ambulatory Visit: Payer: Self-pay | Admitting: Family Medicine

## 2015-05-01 DIAGNOSIS — M25552 Pain in left hip: Secondary | ICD-10-CM

## 2015-05-08 ENCOUNTER — Ambulatory Visit
Admission: RE | Admit: 2015-05-08 | Discharge: 2015-05-08 | Disposition: A | Discharge status: Home / Self Care | Payer: 59 | Source: Ambulatory Visit | Attending: Family Medicine | Admitting: Family Medicine

## 2015-05-08 DIAGNOSIS — M25552 Pain in left hip: Secondary | ICD-10-CM

## 2015-06-12 ENCOUNTER — Telehealth: Payer: Self-pay | Admitting: Genetic Counselor

## 2015-06-12 NOTE — Telephone Encounter (Signed)
Patient called to r/s genetic appt to 09/26 @ 1 w/Karen Florene Glen

## 2015-06-26 ENCOUNTER — Other Ambulatory Visit: Payer: 59

## 2015-06-26 ENCOUNTER — Encounter: Payer: Self-pay | Admitting: Genetic Counselor

## 2015-06-26 ENCOUNTER — Ambulatory Visit (HOSPITAL_BASED_OUTPATIENT_CLINIC_OR_DEPARTMENT_OTHER): Payer: 59 | Admitting: Genetic Counselor

## 2015-06-26 DIAGNOSIS — C50212 Malignant neoplasm of upper-inner quadrant of left female breast: Secondary | ICD-10-CM | POA: Diagnosis not present

## 2015-06-26 DIAGNOSIS — Z8049 Family history of malignant neoplasm of other genital organs: Secondary | ICD-10-CM | POA: Diagnosis not present

## 2015-06-26 DIAGNOSIS — Z8052 Family history of malignant neoplasm of bladder: Secondary | ICD-10-CM

## 2015-06-26 NOTE — Progress Notes (Signed)
REFERRING Alexandria Bradley: Alexandria Bill, MD [place], [place] [address]   Alexandria Del, MD  PRIMARY Alexandria Bradley:  Alexandria Bill, MD  PRIMARY REASON FOR VISIT:  1. Breast cancer of upper-inner quadrant of left female breast   2. Family history of uterine cancer   3. Family history of bladder cancer      HISTORY OF PRESENT ILLNESS:   Alexandria Bradley, a 54 y.o. female, was seen for a [facility] cancer genetics consultation at the request of Dr. Jana Hakim due to a personal and family history of cancer.  Alexandria Bradley presents to clinic today to discuss the possibility of a hereditary predisposition to cancer, genetic testing, and to further clarify her future cancer risks, as well as potential cancer risks for family members.   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    CANCER HISTORY:   No history exists.     HORMONAL RISK FACTORS:  Menarche was at age 66.  First live birth at age 80.  OCP use for approximately 0 years.  Ovaries intact: yes.  Hysterectomy: no.  Menopausal status: postmenopausal.  HRT use: 0 years. Colonoscopy: yes; 3 polyps on the first colonoscopy. Mammogram within the last year: yes. Number of breast biopsies: 2. Up to date with pelvic exams:  yes. Any excessive radiation exposure in the past:  With breast cancer treatment   Past Medical History  Diagnosis Date  . Thyroid disease   . Osteoporosis   . Psoriasis   . Breast cancer   . Arthritis   . Back pain   . Hypothyroidism   . Anxiety   . Breast cancer     left  . Hypertension   . PONV (postoperative nausea and vomiting)   . Hypercholesterolemia   . PVC's (premature ventricular contractions)   . Kidney stones   . Headache(784.0)     PMH : migraines  . Radiation 08/18/14-10/07/14    Left  breast  . Family history of uterine cancer   . Family history of bladder cancer     Past Surgical History  Procedure Laterality Date  . Cholecystectomy    . Tonsillectomy    . Tubal ligation    . Myomectomy    . Lipoma excision    . Portacath placement N/A 03/01/2014    Procedure: INSERTION PORT-A-CATH;  Surgeon: [name], MD;  Location: [facility];  Service: General;  Laterality: N/A;  . Colonoscopy N/A 06/27/2014    Procedure: COLONOSCOPY;  Surgeon: Lear Ng, MD;  Location: [facility];  Service: Endoscopy;  Laterality: N/A;  . Breast lumpectomy with axillary lymph node biopsy  6/15    left  . Port-a-cath removal N/A 02/03/2015    Procedure: REMOVAL PORT-A-CATH;  Surgeon: [name], MD;  Location: [facility];  Service: General;  Laterality: N/A;    Social History   Social History  . Marital Status: Married    Spouse Name: N/A  . Number of Children: 3  . Years of Education: N/A   Social History Main Topics  . Smoking status: Former Smoker -- 0.50 packs/day for 15 years    Types: Cigarettes  . Smokeless tobacco: Never Used     Comment: Quit smoking cigarettes in 2002  . Alcohol Use: Yes     Comment: social  .  Drug Use: No  . Sexual Activity: Not Asked   Other Topics Concern  . None   Social History Narrative     FAMILY HISTORY:  We obtained a detailed, 4-generation family history.  Significant diagnoses are listed below: Family History  Problem Relation Age of Onset  . Endometrial cancer Mother 37  . Lung cancer Father   . Brain cancer Father   . Heart disease Maternal Aunt   . Lung cancer Maternal Uncle   . Stroke Maternal Grandmother   . Stroke Maternal Grandfather   . Bladder Cancer Maternal Uncle   . Multiple myeloma Maternal Uncle   . Other Son     trisomy 30   The patient has a living son and daughter, and three maternal half siblings - two sisters and one brother - none who have cancer.  Her  brother had a daughter who was diagnosed with leukemia at age 11 and died at 37.  Her mother was diagnosed with uterine cancer at 46 and her father was diagnosed with lung and brain cancer and died at 49.  Her mother had two sisters and four brothers.  One brother died in a car accident, one died of lung cancer and one was diagnosed with both bladder cancer and multiple myeloma.  Patient's maternal ancestors are of [place] descent, and paternal ancestors are of unknown descent. There is no reported Ashkenazi Jewish ancestry. There is no known consanguinity.  GENETIC COUNSELING ASSESSMENT: Alexandria Bradley is a 54 y.o. female with a personal history of breast cancer and family history of cancer which somewhat suggestive of a hereditary cancer syndrome and predisposition to cancer. We, therefore, discussed and recommended the following at today's visit.   DISCUSSION: We discussed that about 5-10% of breast cancer is hereditary.  Her mother's family is not highly consistent for a hereditary cancer syndrome, however, she is unfamiliar with her father's family and therefore it is limited.  We reviewed the characteristics, features and inheritance patterns of hereditary cancer syndromes. We also discussed genetic testing, including the appropriate family members to test, the process of testing, insurance coverage and turn-around-time for results. We discussed the implications of a negative, positive and/or variant of uncertain significant result. We recommended Alexandria Bradley pursue genetic testing for the Breast/Ovarian cancer gene panel. The Breast/Ovarian gene panel offered by [facility] includes sequencing and rearrangement analysis for the following 20 genes:  ATM, BARD1, BRCA1, BRCA2, BRIP1, CDH1, CHEK2, EPCAM, FANCC, MLH1, MSH2, MSH6, NBN, PALB2, PMS2, PTEN, RAD51C, RAD51D, TP53, and XRCC2.     Based on Alexandria Bradley's personal and family history of cancer, she meets medical criteria for genetic testing. Despite  that she meets criteria, she may still have an out of pocket cost. We discussed that if her out of pocket cost for testing is over $100, the laboratory will call and confirm whether she wants to proceed with testing.  If the out of pocket cost of testing is less than $100 she will be billed by the genetic testing laboratory.   PLAN: After considering the risks, benefits, and limitations, Alexandria Bradley  provided informed consent to pursue genetic testing and the blood sample was sent to [facility] for analysis of the Breast/Ovarian cancer panel. Results should be available within approximately 2-3 weeks' time, at which point they will be disclosed by telephone to Alexandria Bradley, as will any additional recommendations warranted by these results. Alexandria Bradley will receive a summary of her genetic counseling visit and a copy of her  results once available. This information will also be available in Epic. We encouraged Alexandria Bradley to remain in contact with cancer genetics annually so that we can continuously update the family history and inform her of any changes in cancer genetics and testing that may be of benefit for her family. Alexandria Bradley questions were answered to her satisfaction today. Our contact information was provided should additional questions or concerns arise.  Lastly, we encouraged Alexandria Bradley to remain in contact with cancer genetics annually so that we can continuously update the family history and inform her of any changes in cancer genetics and testing that may be of benefit for this family.   Ms.  Bradley questions were answered to her satisfaction today. Our contact information was provided should additional questions or concerns arise. Thank you for the referral and allowing [place] to share in the care of your patient.   Karen P. Florene Glen, [place], [facility] Certified Genetic Counselor [email or web address] phone: [phone]  The patient was seen for a total of 30 minutes in  face-to-face genetic counseling.  This patient was discussed with Drs. Magrinat, Lindi Adie and/or Burr Medico who agrees with the above.    _______________________________________________________________________ For Office Staff:  Number of people involved in session: 1 Was an Intern/ student involved with case: no

## 2015-06-26 NOTE — Progress Notes (Deleted)
REFERRING PROVIDER: Bartholome Bill, MD [place], [place] [address]   Alexandria Del, MD  PRIMARY PROVIDER:  Bartholome Bill, MD  PRIMARY REASON FOR VISIT:  1. Breast cancer of upper-inner quadrant of left female breast   2. Family history of uterine cancer   3. Family history of bladder cancer      HISTORY OF PRESENT ILLNESS:   Alexandria Bradley, a 54 y.o. female, was seen for a [facility] cancer genetics consultation at the request of Dr. Jana Bradley due to a personal and family history of cancer.  Alexandria Bradley presents to clinic today to discuss the possibility of a hereditary predisposition to cancer, genetic testing, and to further clarify her future cancer risks, as well as potential cancer risks for family members.   In 2015, at the age of 13, Alexandria Bradley was diagnosed with invasive ductal carcinoma of the left breast. 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    CANCER HISTORY:   No history exists.     HORMONAL RISK FACTORS:  Menarche was at age 26.  First live birth at age 85.  OCP use for approximately 0 years.  Ovaries intact: yes.  Hysterectomy: no.  Menopausal status: postmenopausal.  HRT use: 0 years. Colonoscopy: yes; 3 polyps on the first colonoscopy. Mammogram within the last year: yes. Number of breast biopsies: 2. Up to date with pelvic exams:  yes. Any excessive radiation exposure in the past:  With breast cancer treatment  Past Medical History  Diagnosis Date  . Thyroid disease   . Osteoporosis   . Psoriasis   . Breast cancer   . Arthritis   . Back pain   . Hypothyroidism   . Anxiety   . Breast cancer     left  . Hypertension   . PONV (postoperative nausea and vomiting)   . Hypercholesterolemia   . PVC's (premature ventricular contractions)   . Kidney stones   . Headache(784.0)     PMH : migraines  . Radiation 08/18/14-10/07/14    Left breast   . Family history of uterine cancer   . Family history of bladder cancer     Past Surgical History  Procedure Laterality Date  . Cholecystectomy    . Tonsillectomy    . Tubal ligation    . Myomectomy    . Lipoma excision    . Portacath placement N/A 03/01/2014    Procedure: INSERTION PORT-A-CATH;  Surgeon: Alexandria Bookbinder, MD;  Location: [facility];  Service: General;  Laterality: N/A;  . Colonoscopy N/A 06/27/2014    Procedure: COLONOSCOPY;  Surgeon: Alexandria Ng, MD;  Location: [facility];  Service: Endoscopy;  Laterality: N/A;  . Breast lumpectomy with axillary lymph node biopsy  6/15    left  . Port-a-cath removal N/A 02/03/2015    Procedure: REMOVAL PORT-A-CATH;  Surgeon: Alexandria Bookbinder, MD;  Location: [facility];  Service: General;  Laterality: N/A;    Social History   Social History  . Marital Status: Married    Spouse Name: N/A  . Number of Children: N/A  . Years of Education: N/A   Social History Main Topics  . Smoking status: Former Smoker    Types: Cigarettes  . Smokeless tobacco: Never Used     Comment: Quit smoking cigarettes in 2002  . Alcohol Use: Yes     Comment: social  . Drug Use: No  . Sexual  Activity: Not on file   Other Topics Concern  . Not on file   Social History Narrative     FAMILY HISTORY:  We obtained a detailed, 4-generation family history.  Significant diagnoses are listed below: Family History  Problem Relation Age of Onset  . Endometrial cancer Mother   . Lung cancer Father   . Brain cancer Father    The patient has Patient's maternal ancestors are of *** descent, and paternal ancestors are of *** descent. There {IS NO:12509} reported Ashkenazi Jewish ancestry. There {IS NO:12509} known consanguinity.  GENETIC COUNSELING ASSESSMENT: Alexandria Bradley is a 54 y.o. female with a {PERSONAL HISTORY} which somewhat suggestive of a {DISEASE} and predisposition to cancer. We, therefore, discussed  and recommended the following at today's visit.   DISCUSSION: We reviewed the characteristics, features and inheritance patterns of hereditary cancer syndromes. We also discussed genetic testing, including the appropriate family members to test, the process of testing, insurance coverage and turn-around-time for results. We discussed the implications of a negative, positive and/or variant of uncertain significant result. We recommended Alexandria Bradley pursue genetic testing for the *** gene panel.   Based on Alexandria Bradley {Personal/family:20331} history of cancer, she meets medical criteria for genetic testing. Despite that she meets criteria, she may still have an out of pocket cost. We discussed that if her out of pocket cost for testing is over $100, the laboratory will call and confirm whether she wants to proceed with testing.  If the out of pocket cost of testing is less than $100 she will be billed by the genetic testing laboratory.   ***We reviewed the characteristics, features and inheritance patterns of hereditary cancer syndromes. We also discussed genetic testing, including the appropriate family members to test, the process of testing, insurance coverage and turn-around-time for results. We discussed the implications of a negative, positive and/or variant of uncertain significant result. In order to get genetic test results in a timely manner so that Alexandria Bradley can use these genetic test results for surgical decisions, we recommended Alexandria Bradley pursue genetic testing for the ***. If this test is negative, we then recommend Alexandria Bradley pursue reflex genetic testing to the *** gene panel.   Based on Alexandria Bradley's {Personal/family:20331} history of cancer, she meets medical criteria for genetic testing. Despite that she meets criteria, she may still have an out of pocket cost. We discussed that if her out of pocket cost for testing is over $100, the laboratory will call and confirm whether she  wants to proceed with testing.  If the out of pocket cost of testing is less than $100 she will be billed by the genetic testing laboratory.   ***We discussed with Alexandria Bradley that the family history is not highly consistent with a familial hereditary cancer syndrome, and we feel she is at low risk to harbor a gene mutation associated with such a condition. Thus, we did not recommend any genetic testing, at this time, and recommended Alexandria Bradley continue to follow the cancer screening guidelines given by her primary healthcare provider.  ***In order to estimate her chance of having a {CA GENE:62345} mutation, we used statistical models ({GENMODELS:62370}) and laboratory data that take into account her personal medical history, family history and ancestry.  Because each model is different, there can be a lot of variability in the risks they give.  Therefore, these numbers must be considered a rough range and not a precise risk of having a {CA GENE:62345} mutation.  These models estimate that she has approximately a ***-***% chance of having a mutation. Based on this assessment of her family and personal history, genetic testing {IS/ISNOT:34056} recommended.  ***Based on the patient's personal and family history, statistical models ({GENMODELS:62370})  and literature data were used to estimate her risk of developing {CA HX:54794}. These estimate her lifetime risk of developing {CA HX:54794} to be approximately ***% to ***%. This estimation does not take into account any genetic testing results.  The patient's lifetime breast cancer risk is a preliminary estimate based on available information using one of several models endorsed by the [place] (ACS). The ACS recommends consideration of breast MRI screening as an adjunct to mammography for patients at high risk (defined as 20% or greater lifetime risk). A more detailed breast cancer risk assessment can be considered, if clinically indicated.    ***Alexandria Bradley has been determined to be at high risk for breast cancer.  Therefore, we recommend that annual screening with mammography and breast MRI begin at age 81, or 10 years prior to the age of breast cancer diagnosis in a relative (whichever is earlier).  We discussed that Alexandria Bradley should discuss her individual situation with her referring physician and determine a breast cancer screening plan with which they are both comfortable.    PLAN: After considering the risks, benefits, and limitations, Alexandria Bradley  provided informed consent to pursue genetic testing and the blood sample was sent to {Lab} Laboratories for analysis of the {test}. Results should be available within approximately {TAT TIME} weeks' time, at which point they will be disclosed by telephone to Alexandria Bradley, as will any additional recommendations warranted by these results. Alexandria Bradley will receive a summary of her genetic counseling visit and a copy of her results once available. This information will also be available in Epic. We encouraged Alexandria Bradley to remain in contact with cancer genetics annually so that we can continuously update the family history and inform her of any changes in cancer genetics and testing that may be of benefit for her family. Alexandria Bradley questions were answered to her satisfaction today. Our contact information was provided should additional questions or concerns arise.  *** Despite our recommendation, Alexandria Bradley did not wish to pursue genetic testing at today's visit. We understand this decision, and remain available to coordinate genetic testing at any time in the future. We; therefore, recommend Ms. Bejarano continue to follow the cancer screening guidelines given by her primary healthcare provider.  ***Based on Ms. Schomburg's family history, we recommended her ***, who was diagnosed with *** at age ***, have genetic counseling and testing. Ms. Bryars will let us know if we can be of  any assistance in coordinating genetic counseling and/or testing for this family member.   Lastly, we encouraged Ms. Jaye to remain in contact with cancer genetics annually so that we can continuously update the family history and inform her of any changes in cancer genetics and testing that may be of benefit for this family.   Ms.  Umeda questions were answered to her satisfaction today. Our contact information was provided should additional questions or concerns arise. Thank you for the referral and allowing [place] to share in the care of your patient.   Karen P. Florene Glen, [place], [facility] Certified Genetic Counselor [email or web address] phone: [phone]  The patient was seen for a total of *** minutes in face-to-face genetic counseling.  This patient was discussed with Drs. Magrinat, Lindi Adie and/or Burr Medico who agrees with the  above.    _______________________________________________________________________ For Office Staff:  Number of people involved in session: *** Was an Intern/ student involved with case: {YES/NO:63}

## 2015-07-07 ENCOUNTER — Encounter: Payer: Self-pay | Admitting: Genetic Counselor

## 2015-07-07 DIAGNOSIS — Z1379 Encounter for other screening for genetic and chromosomal anomalies: Secondary | ICD-10-CM | POA: Insufficient documentation

## 2015-07-12 ENCOUNTER — Ambulatory Visit: Payer: Self-pay | Admitting: Genetic Counselor

## 2015-07-12 ENCOUNTER — Telehealth: Payer: Self-pay | Admitting: Genetic Counselor

## 2015-07-12 DIAGNOSIS — Z8052 Family history of malignant neoplasm of bladder: Secondary | ICD-10-CM

## 2015-07-12 DIAGNOSIS — Z1379 Encounter for other screening for genetic and chromosomal anomalies: Secondary | ICD-10-CM

## 2015-07-12 DIAGNOSIS — Z8049 Family history of malignant neoplasm of other genital organs: Secondary | ICD-10-CM

## 2015-07-12 DIAGNOSIS — C50212 Malignant neoplasm of upper-inner quadrant of left female breast: Secondary | ICD-10-CM

## 2015-07-12 NOTE — Progress Notes (Signed)
HPI: Ms. Houchin was previously seen in the [place] clinic due to a personal and family history of cancer and concerns regarding a hereditary predisposition to cancer. Please refer to our prior cancer genetics clinic note for more information regarding Ms. Tullis's medical, social and family histories, and our assessment and recommendations, at the time. Ms. Piano recent genetic test results were disclosed to her, as were recommendations warranted by these results. These results and recommendations are discussed in more detail below.  FAMILY HISTORY:  We obtained a detailed, 4-generation family history.  Significant diagnoses are listed below: Family History  Problem Relation Age of Onset  . Endometrial cancer Mother 51  . Lung cancer Father   . Brain cancer Father   . Heart disease Maternal Aunt   . Lung cancer Maternal Uncle   . Stroke Maternal Grandmother   . Stroke Maternal Grandfather   . Bladder Cancer Maternal Uncle   . Multiple myeloma Maternal Uncle   . Other Son     trisomy 58    The patient has a living son and daughter, and three maternal half siblings - two sisters and one brother - none who have cancer. Her brother had a daughter who was diagnosed with leukemia at age 64 and died at 51. Her mother was diagnosed with uterine cancer at 45 and her father was diagnosed with lung and brain cancer and died at 8. Her mother had two sisters and four brothers. One brother died in a car accident, one died of lung cancer and one was diagnosed with both bladder cancer and multiple myeloma. Patient's maternal ancestors are of [place] descent, and paternal ancestors are of unknown descent. There is no reported Ashkenazi Jewish ancestry. There is no known consanguinity.  GENETIC TEST RESULTS: At the time of Ms. Stjulien's visit, we recommended she pursue genetic testing of the Breast/Ovarian cancer gene panel. The Breast/Ovarian gene panel offered by [facility]  includes sequencing and rearrangement analysis for the following 20 genes:  ATM, BARD1, BRCA1, BRCA2, BRIP1, CDH1, CHEK2, EPCAM, FANCC, MLH1, MSH2, MSH6, NBN, PALB2, PMS2, PTEN, RAD51C, RAD51D, TP53, and XRCC2.   The report date is July 06, 2015.  Genetic testing was normal, and did not reveal a deleterious mutation in these genes. The test report has been scanned into EPIC and is located under the Molecular Pathology section of the Results Review tab.   We discussed with Ms. Phillis that since the current genetic testing is not perfect, it is possible there may be a gene mutation in one of these genes that current testing cannot detect, but that chance is small. We also discussed, that it is possible that another gene that has not yet been discovered, or that we have not yet tested, is responsible for the cancer diagnoses in the family, and it is, therefore, important to remain in touch with cancer genetics in the future so that we can continue to offer Ms. Korte the most up to date genetic testing.   CANCER SCREENING RECOMMENDATIONS: This result is reassuring and indicates that Ms. Lanzo likely does not have an increased risk for a future cancer due to a mutation in one of these genes. This normal test also suggests that Ms. Diefenderfer's cancer was most likely not due to an inherited predisposition associated with one of these genes.  Most cancers happen by chance and this negative test suggests that her cancer falls into this category.  We, therefore, recommended she continue to follow the cancer management  and screening guidelines provided by her oncology and primary healthcare provider.   RECOMMENDATIONS FOR FAMILY MEMBERS: Women in this family might be at some increased risk of developing cancer, over the general population risk, simply due to the family history of cancer. We recommended women in this family have a yearly mammogram beginning at age 55, or 30 years younger than the earliest  onset of cancer, an an annual clinical breast exam, and perform monthly breast self-exams. Women in this family should also have a gynecological exam as recommended by their primary provider. All family members should have a colonoscopy by age 9.  FOLLOW-UP: Lastly, we discussed with Ms. Mini that cancer genetics is a rapidly advancing field and it is possible that new genetic tests will be appropriate for her and/or her family members in the future. We encouraged her to remain in contact with cancer genetics on an annual basis so we can update her personal and family histories and let her know of advances in cancer genetics that may benefit this family.   Our contact number was provided. Ms. Yankovich questions were answered to her satisfaction, and she knows she is welcome to call us at anytime with additional questions or concerns.   Roma Kayser, MS, [facility] Certified Genetic Counselor [email or web address]

## 2015-07-12 NOTE — Telephone Encounter (Signed)
Revealed negative genetic testing on the breast/ovarian cancer panel.   

## 2015-08-23 ENCOUNTER — Telehealth: Payer: Self-pay | Admitting: Physical Therapy

## 2015-08-23 NOTE — Telephone Encounter (Signed)
pt refused to be scheduled, called [facility] and notified them

## 2015-10-24 ENCOUNTER — Ambulatory Visit (HOSPITAL_BASED_OUTPATIENT_CLINIC_OR_DEPARTMENT_OTHER): Payer: 59 | Admitting: Oncology

## 2015-10-24 ENCOUNTER — Telehealth: Payer: Self-pay

## 2015-10-24 ENCOUNTER — Telehealth: Payer: Self-pay | Admitting: Oncology

## 2015-10-24 ENCOUNTER — Other Ambulatory Visit: Payer: Self-pay

## 2015-10-24 VITALS — BP 134/70 | HR 71 | Temp 98.2°F | Resp 18 | Ht 65.0 in | Wt 180.5 lb

## 2015-10-24 DIAGNOSIS — N63 Unspecified lump in breast: Secondary | ICD-10-CM

## 2015-10-24 DIAGNOSIS — Z853 Personal history of malignant neoplasm of breast: Secondary | ICD-10-CM | POA: Diagnosis not present

## 2015-10-24 DIAGNOSIS — C50212 Malignant neoplasm of upper-inner quadrant of left female breast: Secondary | ICD-10-CM

## 2015-10-24 NOTE — Progress Notes (Signed)
[name]  Telephone:([phone]) [phone] Fax:(336) [phone]     ID: Alexandria Bradley OB: [birth year]  MR#: [id number]  FKC#:[id number]  PCP: Bartholome Bill, MD GYN:  Brien Few MDSU: [name] OTHER MD: [name], Delrae Rend, Wilford Corner, Dorna Leitz  CHIEF COMPLAINT: Triple negative breast cancer  CURRENT TREATMENT: Observation  BREAST CANCER HISTORY: From the original intake note of 02/16/2014:  Alexandria Bradley palpated a mass in her left breast early May and brought it immediately to her gynecologist attention. He set her up for bilateral diagnostic mammography and left ultrasonography at [facility] 02/07/2014. Mammography showed a 1.3 cm oval mass with spiculated margins in the left breast at the 11:00 position. This was palpable. Ultrasound showed a 1.1 cm tall her than wide mass with a microlobulated margins. He was hypoechoic. There were no abnormalities noted in the left axilla.  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 Lyndon Code at conference 02/16/2014), triple negative, with an MIB-1 of 50%.  The patient's subsequent history is as detailed below  INTERVAL HISTORY: Alexandria Bradley today to let us know that she was feeling a change in her right breast. The changes in the upper part of the breast around the middle of the breast, actually almost just above the breast in the chest wall itself. She came in for an unscheduled visit for further evaluation.  REVIEW OF SYSTEMS: She is under quite a bit of stress right now both involving her job, which is the same job in a different location, and family issues. She has headaches almost every day. These are not new and there are not more intense than before but they are more frequent. She denies nausea or vomiting. She has some sinus symptoms. She is a little bit behind in her eye exam. She  denies cough, phlegm production, or pleurisy. There has not been any change in bowel or bladder habits. A detailed review of systems today was otherwise noncontributory.  PAST MEDICAL HISTORY: Past Medical History  Diagnosis Date  . Thyroid disease   . Osteoporosis   . Psoriasis   . Breast cancer   . Arthritis   . Back pain   . Hypothyroidism   . Anxiety   . Breast cancer     left  . Hypertension   . PONV (postoperative nausea and vomiting)   . Hypercholesterolemia   . PVC's (premature ventricular contractions)   . Kidney stones   . Headache(784.0)     PMH : migraines  . Radiation 08/18/14-10/07/14    Left breast  . Family history of uterine cancer   . Family history of bladder cancer     PAST SURGICAL HISTORY: Past Surgical History  Procedure Laterality Date  . Cholecystectomy    . Tonsillectomy    . Tubal ligation    . Myomectomy    . Lipoma excision    . Portacath placement N/A 03/01/2014    Procedure: INSERTION PORT-A-CATH;  Surgeon: [name], MD;  Location: [place];  Service: General;  Laterality: N/A;  . Colonoscopy N/A 06/27/2014    Procedure: COLONOSCOPY;  Surgeon: Lear Ng, MD;  Location: [place];  Service: Endoscopy;  Laterality: N/A;  . Breast lumpectomy with axillary lymph node biopsy  6/15    left  . Port-a-cath removal N/A 02/03/2015    Procedure: REMOVAL PORT-A-CATH;  Surgeon: [name], MD;  Location: [facility]  [place];  Service: General;  Laterality: N/A;    FAMILY HISTORY Family History  Problem Relation Age of Onset  . Endometrial cancer Mother 29  . Lung cancer Father   . Brain cancer Father   . Heart disease Maternal Aunt   . Lung cancer Maternal Uncle   . Stroke Maternal Grandmother   . Stroke Maternal Grandfather   . Bladder Cancer Maternal Uncle   . Multiple myeloma Maternal Uncle   . Other Son     trisomy 30   the patient's father died at the age of 45 from what may have been  metastatic lung cancer. The patient knows very little about the father's side of her family. Her mother is alive at age 91. She was recently diagnosed with endometrial cancer. The patient has one brother, 2 sisters. There is no history of breast or ovarian cancer in the family.  GYNECOLOGIC HISTORY:  Menarche age 57, first live birth age 40. The patient is GX P3. One child died shortly after birth. She stopped having periods approximately 2005. She did not use hormone replacement. 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 Her husband Alexandria Bradley is a Insurance underwriter. Son Alexandria Bradley is  Nurse, adult in [place]. Daughter Alexandria Bradley lives in [place] and works for [employer] as an Therapist, sports.    ADVANCED DIRECTIVES: Not in place   HEALTH MAINTENANCE: Social History  Substance Use Topics  . Smoking status: Former Smoker -- 0.50 packs/day for 15 years    Types: Cigarettes  . Smokeless tobacco: Never Used     Comment: Quit smoking cigarettes in 2002  . Alcohol Use: Yes     Comment: social     Colonoscopy: 2010  PAP: 2013  Bone density: 05/15/2009, at [facility], normal, lowest T score -0.8  Lipid panel:  Allergies  Allergen Reactions  . Ciprofloxacin Anxiety and Rash  . Prednisone Shortness Of Breath and Other (See Comments)    "jacks me up" jittery  . Codeine Nausea Only and Other (See Comments)    Nausea   . Cortisone Swelling and Other (See Comments)    "jack me up"  . Chlorhexidine Itching    Current Outpatient Prescriptions  Medication Sig Dispense Refill  . acetaminophen (TYLENOL) 500 MG tablet Take 1,000 mg by mouth every 6 (six) hours as needed for headache.     . albuterol (PROAIR HFA) 108 (90 BASE) MCG/ACT inhaler Inhale into the lungs.    Marland Kitchen amLODipine (NORVASC) 5 MG tablet Take 5 mg by mouth daily.     . clonazePAM (KLONOPIN) 0.5 MG tablet Take 0.5 mg by  mouth 2 (two) times daily as needed for anxiety.    Marland Kitchen liothyronine (CYTOMEL) 5 MCG tablet Take 5 mcg by mouth daily.     . metFORMIN (GLUCOPHAGE) 500 MG tablet Take 2 tablets (1,000 mg total) by mouth 2 (two) times daily with a meal.    . Probiotic Product (SOLUBLE FIBER/PROBIOTICS PO) Take 1 capsule by mouth daily.    Marland Kitchen SYNTHROID 137 MCG tablet Take 125 mcg by mouth daily before breakfast.    . triamterene-hydrochlorothiazide (MAXZIDE-25) 37.5-25 MG per tablet Take 1 tablet by mouth daily.      No current facility-administered medications for this visit.    OBJECTIVE: Middle-aged white woman who appears stated age  35 Vitals:   10/24/15 1613  BP: 134/70  Pulse: 71  Temp: 98.2 F (36.8 C)  Resp: 18     Body mass index is 30.04 kg/(m^2).    ECOG FS:1 - Symptomatic but completely ambulatory  Sclerae unicteric, pupils round and equal Oropharynx clear and moist-- no thrush or other lesions No cervical or supraclavicular adenopathy Lungs no rales or rhonchi Heart regular rate and rhythm Abd soft, nontender, positive bowel sounds MSK no focal spinal tenderness, no upper extremity lymphedema Neuro: nonfocal, well oriented, appropriate affect Breasts: I do not palpate a mass in the area in the superior right breast where she feels a change. There seems to be a little bit of thickening of the breast parenchyma there where it meets the chest wall. This is not well-defined. There is no erythema or tenderness. I do not palpate any other masses in the right breast. The right axilla is benign per the left breast is unremarkable.Marland Kitchen   LAB RESULTS: I No results found for: SPEP  Lab Results  Component Value Date   WBC 6.4 04/04/2015   NEUTROABS 4.0 04/04/2015   HGB 13.4 04/04/2015   HCT 39.1 04/04/2015   MCV 90.2 04/04/2015   PLT 300 04/04/2015      Chemistry      Component Value Date/Time   NA 139 04/04/2015 1356   NA 140 02/03/2015 1211   K 3.4* 04/04/2015 1356   K 4.1  02/03/2015 1211   CL 105 02/03/2015 1211   CO2 25 04/04/2015 1356   CO2 29 03/13/2009 2325   BUN 14.5 04/04/2015 1356   BUN 15 02/03/2015 1211   CREATININE 0.8 04/04/2015 1356   CREATININE 0.70 02/03/2015 1211      Component Value Date/Time   CALCIUM 10.2 04/04/2015 1356   CALCIUM 9.4 03/13/2009 2325   ALKPHOS 92 04/04/2015 1356   ALKPHOS 131* 03/13/2009 2325   AST 31 04/04/2015 1356   AST 136* 03/13/2009 2325   ALT 51 04/04/2015 1356   ALT 303* 03/13/2009 2325   BILITOT 0.52 04/04/2015 1356   BILITOT 0.5 03/13/2009 2325       No results found for: LABCA2  No components found for: [id number]  No results for input(s): INR in the last 168 hours.  Urinalysis    Component Value Date/Time   COLORURINE YELLOW 03/13/2009 2251   APPEARANCEUR CLOUDY* 03/13/2009 2251   LABSPEC 1.020 06/15/2014 1101   LABSPEC 1.018 03/13/2009 2251   PHURINE 6.0 06/15/2014 1101   PHURINE 5.5 03/13/2009 2251   GLUCOSEU Negative 06/15/2014 1101   GLUCOSEU NEGATIVE 03/13/2009 2251   HGBUR Negative 06/15/2014 1101   HGBUR NEGATIVE 03/13/2009 2251   BILIRUBINUR Color Interference 06/15/2014 1101   BILIRUBINUR NEGATIVE 03/13/2009 2251   KETONESUR Negative 06/15/2014 1101   KETONESUR NEGATIVE 03/13/2009 2251   PROTEINUR 30 06/15/2014 1101   PROTEINUR NEGATIVE 03/13/2009 2251   UROBILINOGEN 0.2 06/15/2014 1101   UROBILINOGEN 0.2 03/13/2009 2251   NITRITE Negative 06/15/2014 1101   NITRITE NEGATIVE 03/13/2009 2251   LEUKOCYTESUR Trace 06/15/2014 1101   LEUKOCYTESUR SMALL* 03/13/2009 2251    STUDIES: No results found.   ASSESSMENT: 55 y.o. [place] woman status post left breast biopsy 02/08/2014 for a clinical T1c N0, stage IA invasive ductal carcinoma, grade 3, triple negative, with an MIB-1 of 50%.  (1) status post left lumpectomy and sentinel lymph node sampling 03/01/2014 for a pT1c pN0, stage IA invasive ductal carcinoma, grade 3, with negative margins, repeat prognostic panel again  triple negative.  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  Left breast with breath hold technique/ 45 Gy at 1.8 Gy per fraction x 25 fractions.   Left breast boost/ 16 Gy at 2 Gy per fraction x 8 fractions  (4) genetics counseling pendingAt the time of Ms. Blea's visit, we recommended she pursue genetic testing of the Breast/Ovarian cancer gene panel. The Breast/Ovarian gene panel offered by [facility] includes sequencing and rearrangement analysis for the following 20 genes: ATM, BARD1, BRCA1, BRCA2, BRIP1, CDH1, CHEK2, EPCAM, FANCC, MLH1, MSH2, MSH6, NBN, PALB2, PMS2, PTEN, RAD51C, RAD51D, TP53, and XRCC2. The report date is July 06, 2015. Genetic testing was normal, and did not reveal a deleterious mutation in these genes. The test report has been scanned into EPIC and is located under the Molecular Pathology section of the Results Review tab.   (5) question of sleep apnea  (6) transverse colon lesion noted on CT scan from 06/17/2014 - negative biopsy, also showed fatty liver  PLAN: I don't feel a significant change or palpable mass in the area of the right breast that Claudeen is palpating. There seems to be slight thickening there however and for her this is a notable change. Accordingly we're going to follow this up with mammography of the right breast. This should be done within the next week.  She already has an appointment with [place] for March, but if the mammogram on the right is unremarkable I would put that visit off until June, after routine  yearly mammography is resumed.   10/24/2015 4:26 PM  Chauncey Cruel, MD

## 2015-10-24 NOTE — Telephone Encounter (Signed)
Patient called today stating that she found a new breast lump.  Per Dr. Jana Hakim patient can stop in today and he will check the lump.  POF placed.

## 2015-10-24 NOTE — Telephone Encounter (Signed)
per pof to sch pt appt-pt aware of time

## 2015-10-24 NOTE — Telephone Encounter (Signed)
Appointments made and avs printed for patient,pt to go to [facility] 10/27/15 8:45

## 2015-11-07 ENCOUNTER — Other Ambulatory Visit: Payer: Self-pay | Admitting: Oncology

## 2015-11-07 ENCOUNTER — Telehealth: Payer: Self-pay

## 2015-11-07 NOTE — Telephone Encounter (Signed)
LVM for patient with mammogram and US of the breasts results- which were benign per Dr. Jana Hakim.  Patient is to keep lab and MD appt.  No further scans or testing needs to be done at this time.

## 2015-11-08 ENCOUNTER — Telehealth: Payer: Self-pay | Admitting: Oncology

## 2015-11-08 NOTE — Telephone Encounter (Signed)
Patient call and r/s lab from 3/6 to 3/23 and f/u from 3/13 to 3/30. Patient has new date/times.

## 2015-12-04 ENCOUNTER — Other Ambulatory Visit: Payer: 59

## 2015-12-11 ENCOUNTER — Ambulatory Visit: Payer: 59 | Admitting: Nurse Practitioner

## 2015-12-20 ENCOUNTER — Other Ambulatory Visit: Payer: Self-pay | Admitting: *Deleted

## 2015-12-20 DIAGNOSIS — C50212 Malignant neoplasm of upper-inner quadrant of left female breast: Secondary | ICD-10-CM

## 2015-12-21 ENCOUNTER — Other Ambulatory Visit (HOSPITAL_BASED_OUTPATIENT_CLINIC_OR_DEPARTMENT_OTHER): Payer: 59

## 2015-12-21 DIAGNOSIS — Z853 Personal history of malignant neoplasm of breast: Secondary | ICD-10-CM

## 2015-12-21 DIAGNOSIS — C50212 Malignant neoplasm of upper-inner quadrant of left female breast: Secondary | ICD-10-CM

## 2015-12-21 LAB — COMPREHENSIVE METABOLIC PANEL
ALBUMIN: 4.6 g/dL (ref 3.5–5.0)
ALK PHOS: 96 U/L (ref 40–150)
ALT: 51 U/L (ref 0–55)
AST: 31 U/L (ref 5–34)
Anion Gap: 12 mEq/L — ABNORMAL HIGH (ref 3–11)
BUN: 15.9 mg/dL (ref 7.0–26.0)
CO2: 26 meq/L (ref 22–29)
Calcium: 10.3 mg/dL (ref 8.4–10.4)
Chloride: 102 mEq/L (ref 98–109)
Creatinine: 0.8 mg/dL (ref 0.6–1.1)
EGFR: 79 mL/min/{1.73_m2} — AB (ref >90)
GLUCOSE: 106 mg/dL (ref 70–140)
POTASSIUM: 3.9 meq/L (ref 3.5–5.1)
SODIUM: 140 meq/L (ref 136–145)
Total Bilirubin: 0.32 mg/dL (ref 0.20–1.20)
Total Protein: 8.3 g/dL (ref 6.4–8.3)

## 2015-12-21 LAB — CBC WITH DIFFERENTIAL/PLATELET
BASO%: 1 % (ref 0.0–2.0)
Basophils Absolute: 0.1 10*3/uL (ref 0.0–0.1)
EOS%: 2.1 % (ref 0.0–7.0)
Eosinophils Absolute: 0.2 10*3/uL (ref 0.0–0.5)
HEMATOCRIT: 42.5 % (ref 34.8–46.6)
HEMOGLOBIN: 14.3 g/dL (ref 11.6–15.9)
LYMPH#: 2.3 10*3/uL (ref 0.9–3.3)
LYMPH%: 24.5 % (ref 14.0–49.7)
MCH: 30.7 pg (ref 25.1–34.0)
MCHC: 33.7 g/dL (ref 31.5–36.0)
MCV: 91 fL (ref 79.5–101.0)
MONO#: 0.6 10*3/uL (ref 0.1–0.9)
MONO%: 6.7 % (ref 0.0–14.0)
NEUT%: 65.7 % (ref 38.4–76.8)
NEUTROS ABS: 6 10*3/uL (ref 1.5–6.5)
Platelets: 348 10*3/uL (ref 145–400)
RBC: 4.67 10*6/uL (ref 3.70–5.45)
RDW: 13.2 % (ref 11.2–14.5)
WBC: 9.2 10*3/uL (ref 3.9–10.3)

## 2015-12-28 ENCOUNTER — Encounter: Payer: Self-pay | Admitting: Nurse Practitioner

## 2015-12-28 ENCOUNTER — Telehealth: Payer: Self-pay | Admitting: Oncology

## 2015-12-28 ENCOUNTER — Ambulatory Visit (HOSPITAL_BASED_OUTPATIENT_CLINIC_OR_DEPARTMENT_OTHER): Payer: 59 | Admitting: Nurse Practitioner

## 2015-12-28 VITALS — BP 143/80 | HR 79 | Temp 97.8°F | Resp 18 | Wt 181.5 lb

## 2015-12-28 DIAGNOSIS — C50212 Malignant neoplasm of upper-inner quadrant of left female breast: Secondary | ICD-10-CM

## 2015-12-28 DIAGNOSIS — Z853 Personal history of malignant neoplasm of breast: Secondary | ICD-10-CM | POA: Diagnosis not present

## 2015-12-28 NOTE — Progress Notes (Signed)
[place]  [phone] [phone]) [phone]     ID: [name] OB: July 19, 1961  MR#: [id number]  ESP#:[id number]  PCP: Bartholome Bill, MD GYN:  Brien Few MDSU: [name] OTHER MD: [name], Delrae Rend, Wilford Corner, Dorna Leitz  CHIEF COMPLAINT: Triple negative breast cancer  CURRENT TREATMENT: Observation  BREAST CANCER HISTORY: From the original intake note of 02/16/2014:  [name] palpated a mass in her left breast early May and brought it immediately to her gynecologist attention. He set her up for bilateral diagnostic mammography and left ultrasonography at [facility] 02/07/2014. Mammography showed a 1.3 cm oval mass with spiculated margins in the left breast at the 11:00 position. This was palpable. Ultrasound showed a 1.1 cm tall her than wide mass with a microlobulated margins. He was hypoechoic. There were no abnormalities noted in the left axilla.  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 Lyndon Code at conference 02/16/2014), triple negative, with an MIB-1 of 50%.  The patient's subsequent history is as detailed below  INTERVAL HISTORY: Sariyah returns today for follow up of her triple negative breast cancer. She had a mammogram and ultrasound of the right breast in January after she noted some skin thickening. She now feels like there is a change to her left breast that she would like evaluated.   REVIEW OF SYSTEMS: Shontavia is having more frequent vertigo in the past few week that causes nausea. Meclizine has not been helpful, but she feels better on claritin D daily. She is going to have an MRI and be seen by an ENT next Friday. She denies fevers, chills, or changes in bowel or bladder habits. She has headache daily. She has generalized GI discomfort, and has for years. She denies shortness of breath,  chest pain, cough, or palpitations. A detailed review of systems is otherwise stable.   PAST MEDICAL HISTORY: Past Medical History  Diagnosis Date  . Thyroid disease   . Osteoporosis   . Psoriasis   . Breast cancer   . Arthritis   . Back pain   . Hypothyroidism   . Anxiety   . Breast cancer     left  . Hypertension   . PONV (postoperative nausea and vomiting)   . Hypercholesterolemia   . PVC's (premature ventricular contractions)   . Kidney stones   . Headache(784.0)     PMH : migraines  . Radiation 08/18/14-10/07/14    Left breast  . Family history of uterine cancer   . Family history of bladder cancer     PAST SURGICAL HISTORY: Past Surgical History  Procedure Laterality Date  . Cholecystectomy    . Tonsillectomy    . Tubal ligation    . Myomectomy    . Lipoma excision    . Portacath placement N/A 03/01/2014    Procedure: INSERTION PORT-A-CATH;  Surgeon: [name], MD;  Location: [place];  Service: General;  Laterality: N/A;  . Colonoscopy N/A 06/27/2014    Procedure: COLONOSCOPY;  Surgeon: Lear Ng, MD;  Location: [place];  Service: Endoscopy;  Laterality: N/A;  . Breast lumpectomy with axillary lymph node biopsy  6/15    left  . Port-a-cath removal N/A 02/03/2015    Procedure: REMOVAL PORT-A-CATH;  Surgeon: [name], MD;  Location: [place];  Service: General;  Laterality: N/A;    FAMILY HISTORY Family History  Problem Relation Age of Onset  . Endometrial cancer Mother 28  . Lung cancer Father   . Brain cancer Father   . Heart disease Maternal Aunt   . Lung cancer Maternal Uncle   . Stroke Maternal Grandmother   . Stroke Maternal Grandfather   . Bladder Cancer Maternal Uncle   . Multiple myeloma Maternal Uncle   . Other Son     trisomy 28   the patient's father died at the age of 57 from what may have been metastatic lung cancer. The patient knows very little about the father's side of her  family. Her mother is alive at age 64. She was recently diagnosed with endometrial cancer. The patient has one brother, 2 sisters. There is no history of breast or ovarian cancer in the family.  GYNECOLOGIC HISTORY:  Menarche age 18, first live birth age 58. The patient is GX P3. One child died shortly after birth. She stopped having periods approximately 2005. She did not use hormone replacement. 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 Her husband [name] is a Insurance underwriter. Son [name] is  Nurse, adult in [place]. Daughter [name] lives in [place] and works for [employer] as an Therapist, sports.    ADVANCED DIRECTIVES: Not in place   HEALTH MAINTENANCE: Social History  Substance Use Topics  . Smoking status: Former Smoker -- 0.50 packs/day for 15 years    Types: Cigarettes  . Smokeless tobacco: Never Used     Comment: Quit smoking cigarettes in 2002  . Alcohol Use: Yes     Comment: social     Colonoscopy: 2010  PAP: 2013  Bone density: 05/15/2009, at [place], normal, lowest T score -0.8  Lipid panel:  Allergies  Allergen Reactions  . Ciprofloxacin Anxiety and Rash  . Prednisone Shortness Of Breath and Other (See Comments)    "jacks me up" jittery  . Codeine Nausea Only and Other (See Comments)    Nausea   . Cortisone Swelling and Other (See Comments)    "jack me up"  . Chlorhexidine Itching    Current Outpatient Prescriptions  Medication Sig Dispense Refill  . amLODipine (NORVASC) 5 MG tablet Take 5 mg by mouth daily.     Marland Kitchen liothyronine (CYTOMEL) 5 MCG tablet Take 5 mcg by mouth daily.     Marland Kitchen loratadine-pseudoephedrine (CLARITIN-D 24-HOUR) 10-240 MG 24 hr tablet Take 1 tablet by mouth daily.    . metFORMIN (GLUCOPHAGE) 500 MG tablet Take 2 tablets (1,000 mg total) by mouth 2 (two) times daily with a meal.    . Probiotic Product (SOLUBLE  FIBER/PROBIOTICS PO) Take 1 capsule by mouth daily.    Marland Kitchen SYNTHROID 137 MCG tablet Take 125 mcg by mouth daily before breakfast.    . triamterene-hydrochlorothiazide (MAXZIDE-25) 37.5-25 MG per tablet Take 1 tablet by mouth daily.     Marland Kitchen acetaminophen (TYLENOL) 500 MG tablet Take 1,000 mg by mouth every 6 (six) hours as needed for headache. Reported on 12/28/2015    . albuterol (PROAIR HFA) 108 (90 BASE) MCG/ACT inhaler Inhale into the lungs.    . clonazePAM (KLONOPIN) 0.5 MG tablet Take 0.5 mg by mouth 2 (two) times daily as needed for anxiety. Reported on 12/28/2015     No current facility-administered medications for this visit.    OBJECTIVE: Middle-aged white woman who appears stated age  55 Vitals:  12/28/15 1359  BP: 143/80  Pulse: 79  Temp: 97.8 F (36.6 C)  Resp: 18     Body mass index is 30.2 kg/(m^2).    ECOG FS:1 - Symptomatic but completely ambulatory  Skin: warm, dry  HEENT: sclerae anicteric, conjunctivae pink, oropharynx clear. No thrush or mucositis.  Lymph Nodes: No cervical or supraclavicular lymphadenopathy  Lungs: clear to auscultation bilaterally, no rales, wheezes, or rhonci  Heart: regular rate and rhythm  Abdomen: round, soft, non tender, positive bowel sounds  Musculoskeletal: No focal spinal tenderness, no peripheral edema  Neuro: non focal, well oriented, positive affect  Breasts: lef breast status post lumpectomy and radiation. No well defined masses, skin, or nipple changes, left axilla benign. Right breast unremarkable.  LAB RESULTS: No results found for: SPEP  Lab Results  Component Value Date   WBC 9.2 12/21/2015   NEUTROABS 6.0 12/21/2015   HGB 14.3 12/21/2015   HCT 42.5 12/21/2015   MCV 91.0 12/21/2015   PLT 348 12/21/2015      Chemistry      Component Value Date/Time   NA 140 12/21/2015 1429   NA 140 02/03/2015 1211   K 3.9 12/21/2015 1429   K 4.1 02/03/2015 1211   CL 105 02/03/2015 1211   CO2 26 12/21/2015 1429   CO2 29  03/13/2009 2325   BUN 15.9 12/21/2015 1429   BUN 15 02/03/2015 1211   CREATININE 0.8 12/21/2015 1429   CREATININE 0.70 02/03/2015 1211      Component Value Date/Time   CALCIUM 10.3 12/21/2015 1429   CALCIUM 9.4 03/13/2009 2325   ALKPHOS 96 12/21/2015 1429   ALKPHOS 131* 03/13/2009 2325   AST 31 12/21/2015 1429   AST 136* 03/13/2009 2325   ALT 51 12/21/2015 1429   ALT 303* 03/13/2009 2325   BILITOT 0.32 12/21/2015 1429   BILITOT 0.5 03/13/2009 2325       No results found for: LABCA2  No components found for: LABCA125  No results for input(s): INR in the last 168 hours.  Urinalysis    Component Value Date/Time   COLORURINE YELLOW 03/13/2009 2251   APPEARANCEUR CLOUDY* 03/13/2009 2251   LABSPEC 1.020 06/15/2014 1101   LABSPEC 1.018 03/13/2009 2251   PHURINE 6.0 06/15/2014 1101   PHURINE 5.5 03/13/2009 2251   GLUCOSEU Negative 06/15/2014 1101   GLUCOSEU NEGATIVE 03/13/2009 2251   HGBUR Negative 06/15/2014 1101   HGBUR NEGATIVE 03/13/2009 2251   BILIRUBINUR Color Interference 06/15/2014 1101   BILIRUBINUR NEGATIVE 03/13/2009 2251   KETONESUR Negative 06/15/2014 1101   KETONESUR NEGATIVE 03/13/2009 2251   PROTEINUR 30 06/15/2014 1101   PROTEINUR NEGATIVE 03/13/2009 2251   UROBILINOGEN 0.2 06/15/2014 1101   UROBILINOGEN 0.2 03/13/2009 2251   NITRITE Negative 06/15/2014 1101   NITRITE NEGATIVE 03/13/2009 2251   LEUKOCYTESUR Trace 06/15/2014 1101   LEUKOCYTESUR SMALL* 03/13/2009 2251    STUDIES: No results found.   ASSESSMENT: 55 y.o. [place] woman status post left breast biopsy 02/08/2014 for a clinical T1c N0, stage IA invasive ductal carcinoma, grade 3, triple negative, with an MIB-1 of 50%.  (1) status post left lumpectomy and sentinel lymph node sampling 03/01/2014 for a pT1c pN0, stage IA invasive ductal carcinoma, grade 3, with negative margins, repeat prognostic panel again triple negative.  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  Left breast with breath hold technique/ 45 Gy at 1.8 Gy per fraction x 25 fractions.   Left breast boost/ 16 Gy at 2 Gy per fraction x 8 fractions  (4) genetics counseling pendingAt the time of Ms. Macknight's visit, we recommended she pursue genetic testing of the Breast/Ovarian cancer gene panel. The Breast/Ovarian gene panel offered by [facility] includes sequencing and rearrangement analysis for the following 20 genes: ATM, BARD1, BRCA1, BRCA2, BRIP1, CDH1, CHEK2, EPCAM, FANCC, MLH1, MSH2, MSH6, NBN, PALB2, PMS2, PTEN, RAD51C, RAD51D, TP53, and XRCC2. The report date is July 06, 2015. Genetic testing was normal, and did not reveal a deleterious mutation in these genes. The test report has been scanned into EPIC and is located under the Molecular Pathology section of the Results Review tab.   (5) question of sleep apnea  (6) transverse colon lesion noted on CT scan from 06/17/2014 - negative biopsy, also showed fatty liver  PLAN: Ceci is doing well as far as her breast cancer is concerned. She is now almost 2 year out from her definitive surgery with no evidence of recurrent disease. Her most recent right mammogram and ultrasound were negative. She is due for a bilateral diagnostic mammogram in May or June and I have placed those orders during our visit today.   Anjelique will return in 6 months for labs and a follow up visit. She understands and agrees with this plan. She has been encouraged to call with any issues that might arise before her next visit here.   Laurie Panda, NP 12/28/2015

## 2015-12-28 NOTE — Telephone Encounter (Signed)
Gave and printed appt shced and avs for pt for Sept...the patient was already sched at [facility] on 5.11

## 2016-02-08 ENCOUNTER — Encounter: Payer: Self-pay | Admitting: Oncology

## 2016-02-09 ENCOUNTER — Other Ambulatory Visit: Payer: Self-pay | Admitting: Gastroenterology

## 2016-02-09 DIAGNOSIS — R1031 Right lower quadrant pain: Secondary | ICD-10-CM

## 2016-02-15 ENCOUNTER — Ambulatory Visit
Admission: RE | Admit: 2016-02-15 | Discharge: 2016-02-15 | Disposition: A | Discharge status: Home / Self Care | Payer: 59 | Source: Ambulatory Visit | Attending: Gastroenterology | Admitting: Gastroenterology

## 2016-02-15 DIAGNOSIS — R1031 Right lower quadrant pain: Secondary | ICD-10-CM

## 2016-02-15 MED ORDER — IOPAMIDOL (ISOVUE-300) INJECTION 61%
100.0000 mL | Freq: Once | INTRAVENOUS | Status: AC | PRN
  Administered 2016-02-15: 100 mL via INTRAVENOUS

## 2016-04-04 ENCOUNTER — Encounter (HOSPITAL_COMMUNITY): Payer: Self-pay

## 2016-06-19 ENCOUNTER — Other Ambulatory Visit: Payer: Self-pay | Admitting: *Deleted

## 2016-06-19 DIAGNOSIS — C50212 Malignant neoplasm of upper-inner quadrant of left female breast: Secondary | ICD-10-CM

## 2016-06-20 ENCOUNTER — Other Ambulatory Visit (HOSPITAL_BASED_OUTPATIENT_CLINIC_OR_DEPARTMENT_OTHER): Payer: 59

## 2016-06-20 ENCOUNTER — Ambulatory Visit (HOSPITAL_BASED_OUTPATIENT_CLINIC_OR_DEPARTMENT_OTHER): Payer: 59 | Admitting: Oncology

## 2016-06-20 VITALS — BP 149/89 | HR 65 | Temp 98.0°F | Resp 18 | Ht 65.0 in | Wt 192.6 lb

## 2016-06-20 DIAGNOSIS — C50212 Malignant neoplasm of upper-inner quadrant of left female breast: Secondary | ICD-10-CM

## 2016-06-20 DIAGNOSIS — Z853 Personal history of malignant neoplasm of breast: Secondary | ICD-10-CM | POA: Diagnosis not present

## 2016-06-20 LAB — COMPREHENSIVE METABOLIC PANEL
ALBUMIN: 4.3 g/dL (ref 3.5–5.0)
ALK PHOS: 91 U/L (ref 40–150)
ALT: 60 U/L — ABNORMAL HIGH (ref 0–55)
ANION GAP: 13 meq/L — AB (ref 3–11)
AST: 40 U/L — ABNORMAL HIGH (ref 5–34)
BILIRUBIN TOTAL: 0.33 mg/dL (ref 0.20–1.20)
BUN: 15.5 mg/dL (ref 7.0–26.0)
CO2: 24 mEq/L (ref 22–29)
Calcium: 10.1 mg/dL (ref 8.4–10.4)
Chloride: 102 mEq/L (ref 98–109)
Creatinine: 0.8 mg/dL (ref 0.6–1.1)
EGFR: 82 mL/min/{1.73_m2} — ABNORMAL LOW (ref >90)
GLUCOSE: 95 mg/dL (ref 70–140)
Potassium: 3.7 mEq/L (ref 3.5–5.1)
SODIUM: 139 meq/L (ref 136–145)
TOTAL PROTEIN: 7.9 g/dL (ref 6.4–8.3)

## 2016-06-20 LAB — CBC WITH DIFFERENTIAL/PLATELET
BASO%: 0.5 % (ref 0.0–2.0)
Basophils Absolute: 0 10*3/uL (ref 0.0–0.1)
EOS%: 3 % (ref 0.0–7.0)
Eosinophils Absolute: 0.3 10*3/uL (ref 0.0–0.5)
HCT: 40.2 % (ref 34.8–46.6)
HEMOGLOBIN: 13.7 g/dL (ref 11.6–15.9)
LYMPH%: 27.8 % (ref 14.0–49.7)
MCH: 31.2 pg (ref 25.1–34.0)
MCHC: 34.1 g/dL (ref 31.5–36.0)
MCV: 91.6 fL (ref 79.5–101.0)
MONO#: 0.8 10*3/uL (ref 0.1–0.9)
MONO%: 9.1 % (ref 0.0–14.0)
NEUT%: 59.6 % (ref 38.4–76.8)
NEUTROS ABS: 5.2 10*3/uL (ref 1.5–6.5)
Platelets: 330 10*3/uL (ref 145–400)
RBC: 4.39 10*6/uL (ref 3.70–5.45)
RDW: 12.9 % (ref 11.2–14.5)
WBC: 8.7 10*3/uL (ref 3.9–10.3)
lymph#: 2.4 10*3/uL (ref 0.9–3.3)

## 2016-06-20 NOTE — Progress Notes (Signed)
[place]  [phone] Fax:([phone]) [phone]     ID: [name] OB: [birth year]  MR#: [id number]  PFX#:[id number]  PCP: Bartholome Bill, MD GYN:  Brien Few MDSU: [name] OTHER MD: [name], Delrae Rend, Wilford Corner, Dorna Leitz  CHIEF COMPLAINT: Triple negative breast cancer  CURRENT TREATMENT: Observation  BREAST CANCER HISTORY: From the original intake note of 02/16/2014:  [name] palpated a mass in her left breast early May and brought it immediately to her gynecologist attention. He set her up for bilateral diagnostic mammography and left ultrasonography at [facility] 02/07/2014. Mammography showed a 1.3 cm oval mass with spiculated margins in the left breast at the 11:00 position. This was palpable. Ultrasound showed a 1.1 cm tall her than wide mass with a microlobulated margins. He was hypoechoic. There were no abnormalities noted in the left axilla.  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 Lyndon Code at conference 02/16/2014), triple negative, with an MIB-1 of 50%.  The patient's subsequent history is as detailed below  INTERVAL HISTORY: Jaidan returns today for follow up of her breast cancer, which was triple negative. She continues under observation. She gives me no symptoms suggestive of disease recurrence.   REVIEW OF SYSTEMS: Ayaana continues to have vertigo and balance problems. She did see ENT for this and they felt this was likely viral. It did get better so that it is not as bad as it was then, but it has never cleared and while she has not fallen she stumbles a lot. She is not exercising much at all as a result. She does have a cane which she is not using. She is very fatigued. She tells me her Synthroid was recently decreased. She 7 diarrhea but she is also on metformin she has aches and pains  here and there which are not more intense than before. Psoriasis is moderately well controlled. She feels forgetful but not depressed. A detailed review of systems today was otherwise stable.   PAST MEDICAL HISTORY: Past Medical History:  Diagnosis Date  . Anxiety   . Arthritis   . Back pain   . Breast cancer ([place])   . Breast cancer ([place])    left  . Family history of bladder cancer   . Family history of uterine cancer   . Headache(784.0)    PMH : migraines  . Hypercholesterolemia   . Hypertension   . Hypothyroidism   . Kidney stones   . Osteoporosis   . PONV (postoperative nausea and vomiting)   . Psoriasis   . PVC's (premature ventricular contractions)   . Radiation 08/18/14-10/07/14   Left breast  . Thyroid disease     PAST SURGICAL HISTORY: Past Surgical History:  Procedure Laterality Date  . BREAST LUMPECTOMY WITH AXILLARY LYMPH NODE BIOPSY  6/15   left  . CHOLECYSTECTOMY    . COLONOSCOPY N/A 06/27/2014   Procedure: COLONOSCOPY;  Surgeon: Lear Ng, MD;  [facility] END[facility]COPY;  Service: Endoscopy;  Laterality: N/A;  . LIPOMA EXCISION    . MYOMECTOMY    . PORT-A-CATH REMOVAL N/A 02/03/2015   Procedure: REMOVAL PORT-A-CATH;  Surgeon: [name], MD;  Location: [place];  Service: General;  Laterality: N/A;  . PORTACATH PLACEMENT N/A 03/01/2014   Procedure: INSERTION PORT-A-CATH;  Surgeon: [name], MD;  Location: [place];  Service: General;  Laterality: N/A;  .  TONSILLECTOMY    . TUBAL LIGATION      FAMILY HISTORY Family History  Problem Relation Age of Onset  . Endometrial cancer Mother 32  . Lung cancer Father   . Brain cancer Father   . Heart disease Maternal Aunt   . Lung cancer Maternal Uncle   . Stroke Maternal Grandmother   . Stroke Maternal Grandfather   . Bladder Cancer Maternal Uncle   . Multiple myeloma Maternal Uncle   . Other Son     trisomy 6   the patient's father died at the age of 78  from what may have been metastatic lung cancer. The patient knows very little about the father's side of her family. Her mother is alive at age 57 She was recently diagnosed with endometrial cancer. The patient has one brother, 2 sisters. There is no history of breast or ovarian cancer in the family.  GYNECOLOGIC HISTORY:  Menarche age 55, first live birth age 63. The patient is GX P3. One child died shortly after birth. She stopped having periods approximately 2005. She did not use hormone replacement. 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 Her husband [name] is a Insurance underwriter. Son [name] is  Nurse, adult in [place]. Daughter [name] lives in [place] and works for [employer] as an Therapist, sports.    ADVANCED DIRECTIVES: Not in place   HEALTH MAINTENANCE: Social History  Substance Use Topics  . Smoking status: Former Smoker    Packs/day: 0.50    Years: 15.00    Types: Cigarettes  . Smokeless tobacco: Never Used     Comment: Quit smoking cigarettes in 2002  . Alcohol use Yes     Comment: social     Colonoscopy: 2010  PAP: 2013  Bone density: 05/15/2009, at [facility], normal, lowest T score -0.8  Lipid panel:  Allergies  Allergen Reactions  . Ciprofloxacin Anxiety and Rash  . Prednisone Shortness Of Breath and Other (See Comments)    "jacks me up" jittery  . Codeine Nausea Only and Other (See Comments)    Nausea   . Cortisone Swelling and Other (See Comments)    "jack me up"  . Chlorhexidine Itching    Current Outpatient Prescriptions  Medication Sig Dispense Refill  . acetaminophen (TYLENOL) 500 MG tablet Take 1,000 mg by mouth every 6 (six) hours as needed for headache. Reported on 12/28/2015    . albuterol (PROAIR HFA) 108 (90 BASE) MCG/ACT inhaler Inhale into the lungs.    Marland Kitchen amLODipine (NORVASC) 5 MG tablet Take 5 mg by mouth daily.     .  clonazePAM (KLONOPIN) 0.5 MG tablet Take 0.5 mg by mouth 2 (two) times daily as needed for anxiety. Reported on 12/28/2015    . liothyronine (CYTOMEL) 5 MCG tablet Take 5 mcg by mouth daily.     Marland Kitchen loratadine-pseudoephedrine (CLARITIN-D 24-HOUR) 10-240 MG 24 hr tablet Take 1 tablet by mouth daily.    . metFORMIN (GLUCOPHAGE) 500 MG tablet Take 2 tablets (1,000 mg total) by mouth 2 (two) times daily with a meal.    . Probiotic Product (SOLUBLE FIBER/PROBIOTICS PO) Take 1 capsule by mouth daily.    Marland Kitchen SYNTHROID 137 MCG tablet Take 125 mcg by mouth daily before breakfast.    . triamterene-hydrochlorothiazide (MAXZIDE-25) 37.5-25 MG per tablet Take 1 tablet by mouth daily.      No current  facility-administered medications for this visit.     OBJECTIVE: Middle-aged white womanIn no acute distress  Vitals:   06/20/16 1343  BP: (!) 149/89  Pulse: 65  Resp: 18  Temp: 98 F (36.7 C)     Body mass index is 32.05 kg/m.    ECOG FS:1 - Symptomatic but completely ambulatory  Sclerae unicteric, pupils round and equal Oropharynx clear and moist-- no thrush or other lesions No cervical or supraclavicular adenopathy Lungs no rales or rhonchi Heart regular rate and rhythm Abd soft, obese, nontender, positive bowel sounds MSK no focal spinal tenderness, no upper extremity lymphedema Neuro: nonfocal, well oriented, appropriate affect Breasts: The right breast is unremarkable. The left breast is status post lumpectomy and radiation. I do not palpate any suspicious finding. Left axilla is benign.   LAB RESULTS: No results found for: SPEP  Lab Results  Component Value Date   WBC 8.7 06/20/2016   NEUTROABS 5.2 06/20/2016   HGB 13.7 06/20/2016   HCT 40.2 06/20/2016   MCV 91.6 06/20/2016   PLT 330 06/20/2016      Chemistry      Component Value Date/Time   NA 139 06/20/2016 1325   K 3.7 06/20/2016 1325   CL 105 02/03/2015 1211   CO2 24 06/20/2016 1325   BUN 15.5 06/20/2016 1325   CREATININE  0.8 06/20/2016 1325      Component Value Date/Time   CALCIUM 10.1 06/20/2016 1325   ALKPHOS 91 06/20/2016 1325   AST 40 (H) 06/20/2016 1325   ALT 60 (H) 06/20/2016 1325   BILITOT 0.33 06/20/2016 1325       No results found for: LABCA2  No components found for: LABCA125  No results for input(s): INR in the last 168 hours.  Urinalysis    Component Value Date/Time   COLORURINE YELLOW 03/13/2009 2251   APPEARANCEUR CLOUDY (A) 03/13/2009 2251   LABSPEC 1.020 06/15/2014 1101   PHURINE 6.0 06/15/2014 1101   PHURINE 5.5 03/13/2009 2251   GLUCOSEU Negative 06/15/2014 1101   HGBUR Negative 06/15/2014 1101   HGBUR NEGATIVE 03/13/2009 2251   BILIRUBINUR Color Interference 06/15/2014 1101   KETONESUR Negative 06/15/2014 1101   KETONESUR NEGATIVE 03/13/2009 2251   PROTEINUR 30 06/15/2014 1101   PROTEINUR NEGATIVE 03/13/2009 2251   UROBILINOGEN 0.2 06/15/2014 1101   NITRITE Negative 06/15/2014 1101   NITRITE NEGATIVE 03/13/2009 2251   LEUKOCYTESUR Trace 06/15/2014 1101    STUDIES: A CLINICAL DATA:  Right lower quadrant abdominal pain for 6 months, chronic diarrhea, history of breast carcinoma  EXAM: CT ABDOMEN AND PELVIS WITH CONTRAST  TECHNIQUE: Multidetector CT imaging of the abdomen and pelvis was performed using the standard protocol following bolus administration of intravenous contrast.  CONTRAST:  148m ISOVUE-300 IOPAMIDOL (ISOVUE-300) INJECTION 61%  COMPARISON:  CT abdomen pelvis of 06/17/2014  [facility] [place] [facility] [address]Results: Creatinine 0.6 mg/dL. GFR of 100.  FINDINGS: The lung bases are clear. The liver is low in attenuation which may indicate diffuse fatty infiltration. No focal hepatic abnormality is seen. Surgical clips are present from prior cholecystectomy. The pancreas is normal in size and the pancreatic duct is not dilated. There appears to be a small descending duodenum  diverticulum adjacent to the head of the pancreas which is unchanged. The adrenal glands and spleen are unremarkable. The stomach is moderately fluid distended with no abnormality noted. The kidneys enhance with no calculus or mass and no hydronephrosis is seen. On delayed  images, the pelvocaliceal systems are unremarkable, and the proximal ureters are normal in caliber. The abdominal aorta is normal in caliber. No adenopathy is seen.  The urinary bladder is moderately well distended with no abnormality noted. The uterus is normal in size. No adnexal lesion is seen. No fluid is noted within the pelvis. A small low-attenuation focus anteriorly in the peritoneal fat of the pelvis was present previously and may represent a small area of fat necrosis. There are scattered rectosigmoid colon diverticula present. No diverticulitis is seen. The terminal ileum is unremarkable. The appendix appears to be retrocecal, and no inflammation is evident. The lumbar vertebrae are in normal alignment with normal intervertebral disc spaces. Minimal degenerative disc disease is present at L4-5.  IMPRESSION: 1. No explanation for the patient's right lower quadrant pain is seen. The appendix and terminal ileum are unremarkable. 2. Suspect fatty infiltration of the liver.  Correlate with LFTs. 3. No renal or ureteral calculi are noted. A stat item scattered rectosigmoid and distal descending colon diverticula.   Electronically Signed   By: Ivar Drape M.D.   On: 02/15/2016 17:10   ASSESSMENT: 55 y.o. [place] woman status post left breast upper inner quadrant biopsy 02/08/2014 for a clinical T1c N0, stage IA invasive ductal carcinoma, grade 3, triple negative, with an MIB-1 of 50%.  (1) status post left lumpectomy and sentinel lymph node sampling 03/01/2014 for a pT1c pN0, stage IA invasive ductal carcinoma, grade 3, with negative margins, repeat prognostic panel again triple negative.  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  Left breast with breath hold technique/ 45 Gy at 1.8 Gy per fraction x 25 fractions.   Left breast boost/ 16 Gy at 2 Gy per fraction x 8 fractions  (4) genetics counseling pendingAt the time of Ms. Heeg's visit, we recommended she pursue genetic testing of the Breast/Ovarian cancer gene panel. The Breast/Ovarian gene panel offered by [facility] includes sequencing and rearrangement analysis for the following 20 genes: ATM, BARD1, BRCA1, BRCA2, BRIP1, CDH1, CHEK2, EPCAM, FANCC, MLH1, MSH2, MSH6, NBN, PALB2, PMS2, PTEN, RAD51C, RAD51D, TP53, and XRCC2. The report date is July 06, 2015. Genetic testing was normal, and did not reveal a deleterious mutation in these genes. The test report has been scanned into EPIC and is located under the Molecular Pathology section of the Results Review tab.   (5) question of sleep apnea  (6) transverse colon lesion noted on CT scan from 06/17/2014 - negative biopsy, also showed fatty liver  (a) repeat CT scan of the abdomen and pelvis with contrast 02/15/2016 negative  PLAN: Brina is  no little over 2 years out from definitive surgery for her breast cancer. Because estrogen receptor negative tumors, if they're going to recur, 10 to recur early, this is very favorable.  I wish I could solve her vertigo problem. I suggested she discuss it further with her primary care physician and perhaps a second visit to ENT might be fruitful.  Otherwise she will see me again in one year, and we  will continue to follow her until she completes her 5 year protocol.  She knows to  call for any problems that may develop before the next visit.  Chauncey Cruel, MD 06/20/16

## 2016-08-20 ENCOUNTER — Ambulatory Visit (INDEPENDENT_AMBULATORY_CARE_PROVIDER_SITE_OTHER): Payer: 59 | Admitting: Neurology

## 2016-08-20 ENCOUNTER — Encounter: Payer: Self-pay | Admitting: Neurology

## 2016-08-20 VITALS — BP 144/87 | HR 67 | Resp 20 | Ht 65.0 in | Wt 194.0 lb

## 2016-08-20 DIAGNOSIS — F4323 Adjustment disorder with mixed anxiety and depressed mood: Secondary | ICD-10-CM | POA: Diagnosis not present

## 2016-08-20 DIAGNOSIS — G4733 Obstructive sleep apnea (adult) (pediatric): Secondary | ICD-10-CM | POA: Diagnosis not present

## 2016-08-20 DIAGNOSIS — R0683 Snoring: Secondary | ICD-10-CM | POA: Diagnosis not present

## 2016-08-20 NOTE — Progress Notes (Signed)
SLEEP MEDICINE CLINIC   Provider:  Larey Bradley, M D  Referring Provider: Bartholome Bill, MD Primary Care Physician:  Alexandria Bill, MD  Chief Complaint  Patient presents with  . New Patient (Initial Visit)    snores, extreme fatigue, never had sleep study    HPI:  Alexandria Bradley is a 55 y.o. female , seen here as a referral from Alexandria Bradley for a sleep evaluation,  Alexandria Bradley husband has reported that she snores, she has  woken up from her own snoring, by gasping or choking. She has experienced gasping and choking of falling asleep on her sofa,  in a recliner, but also in the bedroom. Her medical history includes  Breast cancer , diagnosed in May of 2015 - treated with Chemotherapy and Radiation, concluded since 2016 .She has hypothyroidism,  hypertension, hyperlipidemia, borderline vitamin D deficiency, and her key concerns are that she feels tired, somewhat depressed, and has excessive daytime somnolence now. She gained 15 pounds since may.  She is still followed by oncology, endocrinology.   Sleep habits are as follows: She works in a [employer] as a Research scientist (physical sciences), 3-1/2 days a week ,and the office closes at 5 PM. After returning home she battles excessive daytime sleepiness, whenever she is not physically active, mentally stimulated she falls very easily asleep, sometimes while being on her cell phone , this has even occurred during a conversation. Her bedroom is described as cool, quiet and dark and she shares that with her husband. There is a sound for background noise. Her husband sleep is sometimes interrupted by her snoring. He has  reported that she stops breathing,  has noted crescendo snoring, gasping. He noted her restless movements at nigh, and her loudest snoring when in supine. She sleeps on multiple pillows.  She goes to bed between 10:30 PM and 11:30 PM and now experiences paradoxical insomnia. She has been treated with Klonopin for sleep issues,  which helps her to initiate sleep. She will sleep for about 4 hours before she starts waking up spontaneously at least twice at night, usually she will use these periods of wakefulness to go to the bathroom but it seems not to be the urge to urinate that woke her. She seldomly she wakes up after only 1 hour of sleep, but this has occurred. Once her alarm rings in the morning at about 5:40 AM she has a hard time getting up, uses the snooze function multiple times before rising at about 5:30. She has frequent morning headaches , feels congested and dry. She sometimes has vertigo, orthostatic dizziness!   Sleep medical history and family sleep history:  No close family member with OSA. Her mother use to fall asleep while driving, had excessive daytime sleepiness and sleep attacks. She does not recall being sleepier than her peers in childhood or young adulthood, had no problems with insomnia either.  Childhood history void of sleep walking, night terrors or enuresis, she underwent a tonsillectomy in childhood.  Social history: [name] is married lives with her husband, and with the grandson 7 years old. She drinks caffeine in form of coffee, 2-3 cups a day, sometimes ice tea, rarely soda. She rarely drinks alcohol, no hard liquor. She quit smoking in 2002, smoked always less than 10 cigarettes a day. She has never worked in shifts.    Review of Systems: Out of a complete 14 system review, the patient complains of only the following symptoms, and all other reviewed systems  are negative. Epworth score 11 , Fatigue severity score 52  , depression score 4. Vertigo with viral labyrinthitis, morning headaches, excessive daytime sleepiness excessive fatigue, witnessed snoring, witnessed sleep choking,  History of early menopause at age 18,   Social History   Social History  . Marital status: Married    Spouse name: N/A  . Number of children: 3  . Years of education: N/A   Occupational  History  . Not on file.   Social History Main Topics  . Smoking status: Former Smoker    Packs/day: 0.50    Years: 15.00    Types: Cigarettes  . Smokeless tobacco: Never Used     Comment: Quit smoking cigarettes in 2002  . Alcohol use Yes     Comment: social  . Drug use: No  . Sexual activity: Not on file   Other Topics Concern  . Not on file   Social History Narrative  . No narrative on file    Family History  Problem Relation Age of Onset  . Endometrial cancer Mother 65  . Lung cancer Father   . Brain cancer Father   . Heart disease Maternal Aunt   . Lung cancer Maternal Uncle   . Stroke Maternal Grandmother   . Stroke Maternal Grandfather   . Bladder Cancer Maternal Uncle   . Multiple myeloma Maternal Uncle   . Other Son     trisomy 46    Past Medical History:  Diagnosis Date  . Anxiety   . Arthritis   . Back pain   . Breast cancer ([place])   . Breast cancer ([place])    left  . Family history of bladder cancer   . Family history of uterine cancer   . Headache(784.0)    PMH : migraines  . Hypercholesterolemia   . Hypertension   . Hypothyroidism   . Kidney stones   . Osteoporosis   . PONV (postoperative nausea and vomiting)   . Psoriasis   . PVC's (premature ventricular contractions)   . Radiation 08/18/14-10/07/14   Left breast  . Thyroid disease     Past Surgical History:  Procedure Laterality Date  . BREAST LUMPECTOMY WITH AXILLARY LYMPH NODE BIOPSY  6/15   left  . CHOLECYSTECTOMY    . COLONOSCOPY N/A 06/27/2014   Procedure: COLONOSCOPY;  Surgeon: Alexandria Ng, MD;  Location: [facility] ENDOSCOPY;  Service: Endoscopy;  Laterality: N/A;  . LIPOMA EXCISION    . MYOMECTOMY    . PORT-A-CATH REMOVAL N/A 02/03/2015   Procedure: REMOVAL PORT-A-CATH;  Surgeon: Alexandria Bookbinder, MD;  Location: [facility];  Service: General;  Laterality: N/A;  . PORTACATH PLACEMENT N/A 03/01/2014   Procedure: INSERTION PORT-A-CATH;  Surgeon: Alexandria Bookbinder, MD;   Location: [facility];  Service: General;  Laterality: N/A;  . TONSILLECTOMY    . TUBAL LIGATION      Current Outpatient Prescriptions  Medication Sig Dispense Refill  . acetaminophen (TYLENOL) 500 MG tablet Take 1,000 mg by mouth every 6 (six) hours as needed for headache. Reported on 12/28/2015    . amLODipine (NORVASC) 5 MG tablet Take 5 mg by mouth daily.     . clonazePAM (KLONOPIN) 0.5 MG tablet Take 0.5 mg by mouth 2 (two) times daily as needed for anxiety. Reported on 12/28/2015    . liothyronine (CYTOMEL) 5 MCG tablet Take 5 mcg by mouth daily.     Marland Kitchen loratadine-pseudoephedrine (CLARITIN-D 24-HOUR) 10-240 MG 24 hr tablet Take 1 tablet  by mouth daily.    . metFORMIN (GLUCOPHAGE) 500 MG tablet Take 2 tablets (1,000 mg total) by mouth 2 (two) times daily with a meal.    . Probiotic Product (SOLUBLE FIBER/PROBIOTICS PO) Take 1 capsule by mouth daily.    Marland Kitchen triamterene-hydrochlorothiazide (MAXZIDE-25) 37.5-25 MG per tablet Take 1 tablet by mouth daily.      No current facility-administered medications for this visit.     Allergies as of 08/20/2016 - Review Complete 08/20/2016  Allergen Reaction Noted  . Ciprofloxacin Anxiety and Rash 05/02/2014  . Prednisone Shortness Of Breath and Other (See Comments) 02/16/2014  . Codeine Nausea Only and Other (See Comments) 02/16/2014  . Cortisone Swelling and Other (See Comments) 12/04/2007  . Chlorhexidine Itching 02/03/2015    Vitals: BP (!) 144/87   Pulse 67   Resp 20   Ht _0  (1.651 m)   Wt 194 lb (88 kg)   BMI 32.28 kg/m  Last Weight:  Wt Readings from Last 1 Encounters:  08/20/16 194 lb (88 kg)   NKN:LZJQ mass index is 32.28 kg/m.     Last Height:   Ht Readings from Last 1 Encounters:  08/20/16 _1  (1.651 m)    Physical exam:  General: The patient is awake, alert and appears not in acute distress. The patient is well groomed. Head: Normocephalic, atraumatic. Neck is supple. Mallampati 3-4 ,  neck  circumference: 14. Nasal airflow congested , TMJ; no click t . Retrognathia is not seen.  Cardiovascular:  Regular rate and rhythm , without  murmurs or carotid bruit, and without distended neck veins. Respiratory: Lungs are clear to auscultation. Skin:  Without evidence of edema, or rash Trunk: BMI is elevated. The patient's posture is erect  Neurologic exam : The patient is awake and alert, oriented to place and time.   Memory subjective  described as intact.  Attention span & concentration ability appears normal.  Speech is fluent,  without   dysarthria, dysphonia or aphasia.  Mood and affect are appropriate.  Cranial nerves: Pupils are equal and briskly reactive to light. Funduscopic exam without evidence of pallor or edema. Extraocular movements  in vertical and horizontal planes intact and without nystagmus. Visual fields by finger perimetry are intact. Hearing to finger rub intact.   Facial sensation intact to fine touch.  Facial motor strength is symmetric and tongue and uvula move midline. Shoulder shrug was symmetrical.   Motor exam: Normal tone, muscle bulk and symmetric strength in all extremities. Sensory:  Fine touch, pinprick and vibration were tested in all extremities. Proprioception tested in the upper extremities was normal. Coordination: Rapid alternating movements and  Finger-to-nose maneuver were normal without evidence of ataxia, dysmetria or tremor. Gait and station: Patient walks without assistive device and is able unassisted to climb up to the exam table. Strength within normal limits.  Stance is stable and normal. Tandem gait is unfragmented. Turns with 3 Steps. Romberg testing is negative.  Deep tendon reflexes: in the  upper and lower extremities are symmetric and intact. Babinski maneuver response is  downgoing.  The patient was advised of the nature of the diagnosed sleep disorder , the treatment options and risks for general a health and wellness arising  from not treating the condition.  I spent more than 35 minutes of face to face time with the patient. Greater than 50% of time was spent in counseling and coordination of care. We have discussed the diagnosis and differential and I answered the patient's questions.  Assessment:  After physical and neurologic examination, review of laboratory studies,  Personal review of imaging studies, reports of other /same  Imaging studies ,  Results of polysomnography/ neurophysiology testing and pre-existing records as far as provided in visit., my assessment is   1)  Mrs. Krasinski is a chief complaint of fatigue, nonrestorative and non-refreshing sleep and excessive daytime sleepiness. She did not endorse a significant amount of depression.  She has been witnessed to snore and to have crescendo breathing, very likely reflecting sleep apnea.  2) her excessive daytime sleepiness has exacerbated over the last 2 years ever since she was diagnosed with breast cancer, underwent surgery, radiation and chemotherapy.   This also explains to some degree lasting fatigue, especially in light of co-morbidities, such as thyroid nodules, hypothyroidism, associated weight gain over 15 pounds over the last 12 months, antihypertensives and is sleepy making side effects.  The patient does not locate a lot of time for her sleep but would be beneficial if her bedtime would be no later than 10 to allow her 7 hours of sleep before her alarm rings. Her primary care physician has provided her with Klonopin which has helped insomnia but does not last throughout the night. Tid for anxiety.   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 This was attributed to a viral labyrinthitis which would have now cleared.    Plan:  Treatment plan and additional workup :  HST , before 09-29-2016 Rv after HST .   Asencion Partridge Cincere Deprey MD  08/20/2016   CC: Alexandria Bradley, [place],  [id number]

## 2016-08-20 NOTE — Patient Instructions (Signed)
Hypersomnia Introduction Hypersomnia is when you feel extremely tired during the day even though you're getting plenty of sleep at night. You may need to take naps during the day, and you may also be extremely difficult to wake up when you are sleeping. What are the causes? The cause of your hypersomnia may not be known. Hypersomnia may be caused by:  Medicines.  Sleep disorders, such as narcolepsy.  Trauma or injury to your head or nervous system.  Using drugs or alcohol.  Tumors.  Medical conditions, such as depression or hypothyroidism.  Genetics. What are the signs or symptoms? The main symptoms of hypersomnia include:  Feeling extremely tired throughout the day.  Being very difficult to wake up.  Sleeping for longer and longer periods.  Taking naps throughout the day. Other symptoms may include:  Feeling:  Restless.  Annoyed.  Anxious.  Low energy.  Having difficulty:  Remembering.  Speaking.  Thinking.  Losing your appetite.  Experiencing hallucinations. How is this diagnosed? Hypersomnia may be diagnosed by:  Medical history and physical exam. This will include a sleep history.  Completing sleep logs.  Tests may also be done, such as:  Polysomnography.  Multiple sleep latency test (MSLT). How is this treated? There is no cure for hypersomnia, but treatment can be very effective in helping manage the condition. Treatment may include:  Lifestyle and sleeping strategies to help cope with the condition.  Stimulant medicines.  Treating any underlying causes of hypersomnia. Follow these instructions at home:  Take medicines only as directed by your health care provider.  Schedule short naps for when you feel sleepiest during the day. Tell your employer or teachers that you have hypersomnia. You may be able to adjust your schedule to include time for naps.  Avoid drinking alcohol or caffeinated beverages.  Do not eat a heavy meal before  bedtime. Eat at about the same times every day.  Do not drive or operate heavy machinery if you are sleepy.  Do not swim or go out on the water without a life jacket.  If possible, adjust your schedule so that you do not have to work or be active at night.  Keep all follow-up visits as directed by your health care provider. This is important. Contact a health care provider if:  You have new symptoms.  Your symptoms get worse. Get help right away if: You have serious thoughts of hurting yourself or someone else. This information is not intended to replace advice given to you by your health care provider. Make sure you discuss any questions you have with your health care provider. Document Released: 09/06/2002 Document Revised: 02/22/2016 Document Reviewed: 04/21/2014  2017 [employer]  

## 2016-11-13 ENCOUNTER — Encounter (HOSPITAL_COMMUNITY): Payer: Self-pay

## 2016-12-03 ENCOUNTER — Encounter (HOSPITAL_COMMUNITY): Payer: Self-pay | Admitting: Emergency Medicine

## 2016-12-03 ENCOUNTER — Emergency Department (HOSPITAL_COMMUNITY)
Admission: EM | Admit: 2016-12-03 | Discharge: 2016-12-03 | Disposition: A | Discharge status: Home / Self Care | Payer: 59 | Attending: Emergency Medicine | Admitting: Emergency Medicine

## 2016-12-03 ENCOUNTER — Emergency Department (HOSPITAL_COMMUNITY): Payer: 59

## 2016-12-03 DIAGNOSIS — Z87891 Personal history of nicotine dependence: Secondary | ICD-10-CM | POA: Insufficient documentation

## 2016-12-03 DIAGNOSIS — E039 Hypothyroidism, unspecified: Secondary | ICD-10-CM | POA: Insufficient documentation

## 2016-12-03 DIAGNOSIS — I10 Essential (primary) hypertension: Secondary | ICD-10-CM | POA: Insufficient documentation

## 2016-12-03 DIAGNOSIS — Z853 Personal history of malignant neoplasm of breast: Secondary | ICD-10-CM | POA: Diagnosis not present

## 2016-12-03 DIAGNOSIS — R079 Chest pain, unspecified: Secondary | ICD-10-CM

## 2016-12-03 DIAGNOSIS — Z79899 Other long term (current) drug therapy: Secondary | ICD-10-CM | POA: Insufficient documentation

## 2016-12-03 DIAGNOSIS — R0789 Other chest pain: Secondary | ICD-10-CM | POA: Diagnosis not present

## 2016-12-03 LAB — BASIC METABOLIC PANEL
ANION GAP: 11 (ref 5–15)
BUN: 16 mg/dL (ref 6–20)
CO2: 22 mmol/L (ref 22–32)
Calcium: 10.4 mg/dL — ABNORMAL HIGH (ref 8.9–10.3)
Chloride: 105 mmol/L (ref 101–111)
Creatinine, Ser: 0.66 mg/dL (ref 0.44–1.00)
GFR calc Af Amer: >60 mL/min (ref >60)
Glucose, Bld: 113 mg/dL — ABNORMAL HIGH (ref 65–99)
POTASSIUM: 3.4 mmol/L — AB (ref 3.5–5.1)
Sodium: 138 mmol/L (ref 135–145)

## 2016-12-03 LAB — CBC
HEMATOCRIT: 42.3 % (ref 36.0–46.0)
HEMOGLOBIN: 14.4 g/dL (ref 12.0–15.0)
MCH: 30.5 pg (ref 26.0–34.0)
MCHC: 34 g/dL (ref 30.0–36.0)
MCV: 89.6 fL (ref 78.0–100.0)
Platelets: 370 10*3/uL (ref 150–400)
RBC: 4.72 MIL/uL (ref 3.87–5.11)
RDW: 12.7 % (ref 11.5–15.5)
WBC: 7.7 10*3/uL (ref 4.0–10.5)

## 2016-12-03 LAB — I-STAT TROPONIN, ED
Troponin i, poc: 0 ng/mL (ref 0.00–0.08)
Troponin i, poc: 0.01 ng/mL (ref 0.00–0.08)

## 2016-12-03 MED ORDER — ASPIRIN 81 MG PO CHEW
324.0000 mg | CHEWABLE_TABLET | Freq: Once | ORAL | Status: AC
  Administered 2016-12-03: 324 mg via ORAL
  Filled 2016-12-03: qty 4

## 2016-12-03 MED ORDER — GI COCKTAIL ~~LOC~~
30.0000 mL | Freq: Once | ORAL | Status: AC
  Administered 2016-12-03: 30 mL via ORAL
  Filled 2016-12-03: qty 30

## 2016-12-03 NOTE — ED Triage Notes (Signed)
Patient states she had chest pain on Saturday and the pain increased today, radiating to her right arm and back.  She had some SOB and dizzyness.  Patient had some N on Saturday but no N/V/D today.    Patient states she has a history of heart palpations.

## 2016-12-03 NOTE — ED Notes (Signed)
ED Provider at bedside. 

## 2016-12-03 NOTE — Discharge Instructions (Signed)

## 2016-12-03 NOTE — ED Provider Notes (Signed)
Emergency Department Provider Note   I have reviewed the triage vital signs and the nursing notes.   HISTORY  Chief Complaint Chest Pain   HPI Alexandria Bradley is a 56 y.o. female with PMH of breast cancer, HTN, and HLD presents to the emergency department for evaluation of chest pain and palpitations intermittently over the past 3 days. Patient states that she began having a "flip-flop" sensation in the chest with some mild dyspnea. The patient coughed hard and took some deep breaths with resolution of palpitations. Shortly afterwards she developed some chest pain in the right chest. It feels like a pressure and is non-radiating. No fever, chills, or productive cough. No associated nausea, vomiting, diarrhea. No modifying factors. No pleuritic quality.   Past Medical History:  Diagnosis Date  . Anxiety   . Arthritis   . Back pain   . Breast cancer ([place])   . Breast cancer ([place])    left  . Family history of bladder cancer   . Family history of uterine cancer   . Headache(784.0)    PMH : migraines  . Hypercholesterolemia   . Hypertension   . Hypothyroidism   . Kidney stones   . Osteoporosis   . PONV (postoperative nausea and vomiting)   . Psoriasis   . PVC's (premature ventricular contractions)   . Radiation 08/18/14-10/07/14   Left breast  . Thyroid disease     Patient Active Problem List   Diagnosis Date Noted  . OSA (obstructive sleep apnea) 08/20/2016  . Snoring 08/20/2016  . Adjustment disorder with mixed anxiety and depressed mood 08/20/2016  . Genetic testing 07/07/2015  . Family history of uterine cancer   . Family history of bladder cancer   . Abnormal AST and ALT 07/08/2014  . Fatty infiltration of liver 07/08/2014  . RUQ pain 06/27/2014  . Nonspecific (abnormal) findings on radiological and other examination of gastrointestinal tract 06/27/2014  . Fever 06/15/2014  . Diarrhea 06/10/2014  . Chemotherapy-induced nausea 06/10/2014  . Abdominal pain  06/10/2014  . Drug induced neutropenia(288.03) 04/21/2014  . Breast cancer of upper-inner quadrant of left female breast ([place]) 02/11/2014  . OTHER CHEST PAIN 05/21/2010  . HYPERLIPIDEMIA 05/11/2010  . PSORIASIS 05/11/2010  . PALPITATIONS 05/11/2010  . Nonspecific (abnormal) findings on radiological and other examination of body structure 05/11/2010  . CHEST XRAY, ABNORMAL 05/11/2010  . HYPOTHYROIDISM 05/10/2010  . EPIGASTRIC PAIN 05/10/2010  . CONJUNCTIVITIS, ALLERGIC 02/18/2008  . MALAISE AND FATIGUE 01/11/2008  . Essential hypertension 09/02/2007  . SINUSITIS- ACUTE-NOS 09/02/2007  . Acute upper respiratory infection 09/02/2007  . Allergic rhinitis, cause unspecified 09/02/2007    Past Surgical History:  Procedure Laterality Date  . BREAST LUMPECTOMY WITH AXILLARY LYMPH NODE BIOPSY  6/15   left  . CHOLECYSTECTOMY    . COLONOSCOPY N/A 06/27/2014   Procedure: COLONOSCOPY;  Surgeon: Lear Ng, MD;  Location[facility] ENDOSCOPY;  Service: Endoscopy;  Laterality: N/A;  . LIPOMA EXCISION    . MYOMECTOMY    . PORT-A-CATH REMOVAL N/A 02/03/2015   Procedure: REMOVAL PORT-A-CATH;  Surgeon: [name], MD;  Location: [place];  Service: General;  Laterality: N/A;  . PORTACATH PLACEMENT N/A 03/01/2014   Procedure: INSERTION PORT-A-CATH;  Surgeon: [name], MD;  Location: [place];  Service: General;  Laterality: N/A;  . TONSILLECTOMY    . TUBAL LIGATION      Current Outpatient Rx  . Order #: [id number] Class: Historical Med  . Order #: [id number] Class:  Historical Med  . Order #: [id number] Class: Historical Med  . Order #: [id number] Class: Historical Med  . Order #: [id number] Class: Historical Med  . Order #: [id number] Class: Historical Med  . Order #: [id number] Class: Historical Med    Allergies Ciprofloxacin; Prednisone; Codeine; Cortisone; and Chlorhexidine  Family History  Problem Relation Age of Onset  . Endometrial cancer Mother 39    . Lung cancer Father   . Brain cancer Father   . Heart disease Maternal Aunt   . Lung cancer Maternal Uncle   . Stroke Maternal Grandmother   . Stroke Maternal Grandfather   . Bladder Cancer Maternal Uncle   . Multiple myeloma Maternal Uncle   . Other Son     trisomy 39    Social History Social History  Substance Use Topics  . Smoking status: Former Smoker    Packs/day: 0.50    Years: 15.00    Types: Cigarettes  . Smokeless tobacco: Never Used     Comment: Quit smoking cigarettes in 2002  . Alcohol use Yes     Comment: social    Review of Systems  Constitutional: No fever/chills Eyes: No visual changes. ENT: No sore throat. Cardiovascular: Positive chest pain and palpitations.  Respiratory: Denies shortness of breath. Gastrointestinal: No abdominal pain.  No nausea, no vomiting.  No diarrhea.  No constipation. Genitourinary: Negative for dysuria. Musculoskeletal: Negative for back pain. Skin: Negative for rash. Neurological: Negative for headaches, focal weakness or numbness.  10-point ROS otherwise negative.  ____________________________________________   PHYSICAL EXAM:  VITAL SIGNS: Resp: 18 SpO2: 95% RA Pulse: 65 BP: 149/82  Constitutional: Alert and oriented. Well appearing and in no acute distress. Eyes: Conjunctivae are normal.  Head: Atraumatic. Nose: No congestion/rhinnorhea. Mouth/Throat: Mucous membranes are moist.  Neck: No stridor.  Cardiovascular: Normal rate, regular rhythm. Good peripheral circulation. Grossly normal heart sounds.   Respiratory: Normal respiratory effort.  No retractions. Lungs CTAB. Gastrointestinal: Soft and nontender. No distention.  Musculoskeletal: No lower extremity tenderness nor edema. No gross deformities of extremities. Neurologic:  Normal speech and language. No gross focal neurologic deficits are appreciated.  Skin:  Skin is warm, dry and intact. No rash noted. Psychiatric: Mood and affect are normal. Speech  and behavior are normal.  ____________________________________________   LABS (all labs ordered are listed, but only abnormal results are displayed)  Labs Reviewed  BASIC METABOLIC PANEL - Abnormal; Notable for the following:       Result Value   Potassium 3.4 (*)    Glucose, Bld 113 (*)    Calcium 10.4 (*)    All other components within normal limits  CBC  I-STAT TROPOININ, ED  I-STAT TROPOININ, ED   ____________________________________________  EKG   EKG Interpretation  Date/Time:  Tuesday December 03 2016 18:28:50 EST Ventricular Rate:  61 PR Interval:    QRS Duration: 96 QT Interval:  457 QTC Calculation: 461 R Axis:   3 Text Interpretation:  Sinus rhythm Probable left ventricular hypertrophy No STEMI.  Confirmed by Adrina Armijo MD, Lakiah Dhingra [phone]) on 12/03/2016 7:06:43 PM Also confirmed by Ashyah Quizon MD, Demiana Crumbley [phone]), editor [place]  CCT, [place] ([id number])  on 12/04/2016 7:07:27 AM       ____________________________________________  RADIOLOGY  Dg Chest 2 View  Result Date: 12/03/2016 CLINICAL DATA:  Mid and right-sided chest pain with palpitations for the past 4 days. Former smoker. Has hypertension, history of breast malignancy. EXAM: CHEST  2 VIEW COMPARISON:  Portable chest x-ray of March 01, 2014 FINDINGS: The lungs are  adequately inflated and clear. The heart and pulmonary vascularity are normal. The mediastinum is normal in width. There is no pleural effusion. There is gentle dextrocurvature centered in the mid thoracic spine. There is no compression fracture. IMPRESSION: There is no pneumonia, CHF, nor other acute cardiopulmonary abnormality. Electronically Signed   By: David  Martinique M.D.   On: 12/03/2016 14:00    ____________________________________________   PROCEDURES  Procedure(s) performed:   Procedures  None ____________________________________________   INITIAL IMPRESSION / ASSESSMENT AND PLAN / ED COURSE  Pertinent labs & imaging results that were available  during my care of the patient were reviewed by me and considered in my medical decision making (see chart for details).  Patient presents to the emergency department for evaluation of chest pressure and palpitations. These occur intermittently with no exertional or pleuritic quality. Patient has associated hypertension. HEART score 3. Low suspicion for DVT/PE or vascular etiology. Plan for repeat troponin and EKG. CXR negative.   7:00 PM Repeat troponin is negative. Updated patient who is feeling well. Discussed results and plan in detail which involves prompt Cardiology follow up along with scheduling PCP appt. Discussed outpatient stress testing and consideration of outpatient heart monitoring.   At this time, I do not feel there is any life-threatening condition present. I have reviewed and discussed all results (EKG, imaging, lab, urine as appropriate), exam findings with patient. I have reviewed nursing notes and appropriate previous records.  I feel the patient is safe to be discharged home without further emergent workup. Discussed usual and customary return precautions. Patient and family (if present) verbalize understanding and are comfortable with this plan.  Patient will follow-up with their primary care provider. If they do not have a primary care provider, information for follow-up has been provided to them. All questions have been answered.  ____________________________________________  FINAL CLINICAL IMPRESSION(S) / ED DIAGNOSES  Final diagnoses:  Nonspecific chest pain     MEDICATIONS GIVEN DURING THIS VISIT:  Medications  gi cocktail (Maalox,Lidocaine,Donnatal) (30 mLs Oral Given 12/03/16 1828)  aspirin chewable tablet 324 mg (324 mg Oral Given 12/03/16 1828)     NEW OUTPATIENT MEDICATIONS STARTED DURING THIS VISIT:  None   Note:  This document was prepared using Dragon voice recognition software and may include unintentional dictation errors.  Nanda Quinton,  MD Emergency Medicine   Margette Fast, MD 12/04/16 [phone]

## 2016-12-31 ENCOUNTER — Encounter: Payer: Self-pay | Admitting: Cardiovascular Disease

## 2017-01-13 NOTE — Progress Notes (Signed)
Cardiology Office Note   Date:  01/16/2017   ID:  Alexandria Bradley, DOB May 30, 1961, MRN [id number]  PCP:  Alexandria Bill, Bradley  Cardiologist:   Jenkins Rouge, Bradley   Chief Complaint  Patient presents with  . Feeling of chest tightness  . Establish Care      History of Present Illness: Alexandria Bradley is a 56 y.o. female who presents for evaluation/consultation of chest pain. Referred by Alexandria Bradley.  Seen by me in 2008 and 2011 with benign palpitations with negative monitor and normal stress echo  Had f/u echo 2015 "chemo evaluation"  With breast cancer and normal with EF 60-65% strain -21.7 aortic root 3.8 cm Seen in ER 12/03/16 for chest pain. CRF HTN , DM and elevaated lipids. Had flip flops for 3 days tightness in chest and dyspnea. Pain was on right side of ches.t not radiating  R/O CXR NAD ECG no acute changes d/c home for outpatient cardiology f/u Telemetry with no arrhythmia .  Palpitations in past related to too much caffeine. SSCP occurs after palpitations Thyroid medicine recently changed. Did not like synthroid has not had f/u TSH since change  Works at a [employer] her 45 yo grandson   Past Medical History:  Diagnosis Date  . Anxiety   . Arthritis   . Back pain   . Breast cancer ([place])   . Breast cancer ([place])    left  . Family history of bladder cancer   . Family history of uterine cancer   . Headache(784.0)    PMH : migraines  . Hypercholesterolemia   . Hypertension   . Hypothyroidism   . Kidney stones   . Osteoporosis   . PONV (postoperative nausea and vomiting)   . Psoriasis   . PVC's (premature ventricular contractions)   . Radiation 08/18/14-10/07/14   Left breast  . Thyroid disease     Past Surgical History:  Procedure Laterality Date  . BREAST LUMPECTOMY WITH AXILLARY LYMPH NODE BIOPSY  6/15   left  . CHOLECYSTECTOMY    . COLONOSCOPY N/A 06/27/2014   Procedure: COLONOSCOPY;  Surgeon: Lear Ng, Bradley;  Location:  [facility] ENDOSCOPY;  Service: Endoscopy;  Laterality: N/A;  . LIPOMA EXCISION    . MYOMECTOMY    . PORT-A-CATH REMOVAL N/A 02/03/2015   Procedure: REMOVAL PORT-A-CATH;  Surgeon: [name], Bradley;  Location: [place];  Service: General;  Laterality: N/A;  . PORTACATH PLACEMENT N/A 03/01/2014   Procedure: INSERTION PORT-A-CATH;  Surgeon: [name], Bradley;  Location: [place];  Service: General;  Laterality: N/A;  . TONSILLECTOMY    . TUBAL LIGATION       Current Outpatient Prescriptions  Medication Sig Dispense Refill  . acetaminophen (TYLENOL) 500 MG tablet Take 1,000 mg by mouth every 6 (six) hours as needed for headache. Reported on 12/28/2015    . amLODipine (NORVASC) 5 MG tablet Take 5 mg by mouth daily.     Marland Kitchen liothyronine (CYTOMEL) 5 MCG tablet Take 5 mcg by mouth daily.     Marland Kitchen loratadine-pseudoephedrine (CLARITIN-D 24-HOUR) 10-240 MG 24 hr tablet Take 1 tablet by mouth daily.    . metFORMIN (GLUCOPHAGE) 500 MG tablet Take 1,000 mg by mouth every evening.     Marland Kitchen TIROSINT 112 MCG CAPS TAKE ONE CAPSULE BY MOUTH ON AN EMPTY STOMACH IN THE MORNING  2  . triamterene-hydrochlorothiazide (MAXZIDE-25) 37.5-25 MG per tablet Take 1 tablet by mouth daily.     Marland Kitchen  propranolol (INDERAL) 10 MG tablet Take 1 tablet (10 mg total) by mouth as needed (palpitations). 90 tablet 3   No current facility-administered medications for this visit.     Allergies:   Ciprofloxacin; Prednisone; Codeine; Cortisone; and Chlorhexidine    Social History:  The patient  reports that she has quit smoking. Her smoking use included Cigarettes. She has a 7.50 pack-year smoking history. She has never used smokeless tobacco. She reports that she drinks alcohol. She reports that she does not use drugs.   Family History:  The patient's family history includes Bladder Cancer in her maternal uncle; Brain cancer in her father; Endometrial cancer (age of onset: 56) in her mother; Heart disease in her  maternal aunt; Lung cancer in her father and maternal uncle; Multiple myeloma in her maternal uncle; Other in her son; Stroke in her maternal grandfather and maternal grandmother.    ROS:  Please see the history of present illness.   Otherwise, review of systems are positive for none.   All other systems are reviewed and negative.    PHYSICAL EXAM: VS:  BP (!) 142/80   Pulse (!) 58   Ht _0  (1.651 m)   Wt 179 lb 12.8 oz (81.6 kg)   SpO2 97%   BMI 29.92 kg/m  , BMI Body mass index is 29.92 kg/m. Affect appropriate Healthy:  appears stated age 24: normal Neck supple with no adenopathy JVP normal no bruits no thyromegaly Lungs clear with no wheezing and good diaphragmatic motion Heart:  S1/S2 no murmur, no rub, gallop or click PMI normal Abdomen: benighn, BS positve, no tenderness, no AAA no bruit.  No HSM or HJR Distal pulses intact with no bruits No edema Neuro non-focal Skin warm and dry No muscular weakness    EKG:  SR rate 78 normal done 12/05/16   Recent Labs: 06/20/2016: ALT 60 12/03/2016: BUN 16; Creatinine, Ser 0.66; Hemoglobin 14.4; Platelets 370; Potassium 3.4; Sodium 138    Lipid Panel    Component Value Date/Time   CHOL 243 (HH) 01/12/2008 0815   TRIG 198 (H) 01/12/2008 0815   TRIG 209 (HH) 09/22/2006 1056   HDL 38.4 (L) 01/12/2008 0815   CHOLHDL 6.3 CALC 01/12/2008 0815   VLDL 40 01/12/2008 0815   LDLDIRECT 175.2 01/12/2008 0815      Wt Readings from Last 3 Encounters:  01/16/17 179 lb 12.8 oz (81.6 kg)  12/03/16 192 lb (87.1 kg)  08/20/16 194 lb (88 kg)      Other studies Reviewed: Additional studies/ records that were reviewed today include: Notes from ER March old cardiology notes myovue and echo . Labs and ECG;s .    ASSESSMENT AND PLAN:  1.  Chest Pain atypical normal ECG f/u ETT 2. Palpitations benign sounding check TSH/T4 event monitor PRN inderal  3. HTN: Well controlled.  Continue current medications and low sodium Dash type  diet.   4. Cholesterol    Labs with primary consider statin since she is diabetic 5. DM:  Discussed low carb diet.  Target hemoglobin A1c is 6.5 or less.  Continue current medications.   C       Current medicines are reviewed at length with the patient today.  The patient does not have concerns regarding medicines.  The following changes have been made:  PRN Inderal   Labs/ tests ordered today include: Event monitor TSH/T4 and ETT   Orders Placed This Encounter  Procedures  . T4, free  . TSH  .  EXERCISE TOLERANCE TEST  . Cardiac event monitor     Disposition:   FU with me in 6-8 weeks      Signed, Jenkins Rouge, Bradley  01/16/2017 2:45 PM    [facility] [place], [place], [place]  [address] Phone: [phone]; Fax: [phone]

## 2017-01-16 ENCOUNTER — Encounter: Payer: Self-pay | Admitting: Cardiovascular Disease

## 2017-01-16 ENCOUNTER — Ambulatory Visit (INDEPENDENT_AMBULATORY_CARE_PROVIDER_SITE_OTHER): Payer: 59 | Admitting: Cardiovascular Disease

## 2017-01-16 VITALS — BP 142/80 | HR 58 | Ht 65.0 in | Wt 179.8 lb

## 2017-01-16 DIAGNOSIS — R079 Chest pain, unspecified: Secondary | ICD-10-CM | POA: Diagnosis not present

## 2017-01-16 DIAGNOSIS — Z7689 Persons encountering health services in other specified circumstances: Secondary | ICD-10-CM

## 2017-01-16 DIAGNOSIS — R0789 Other chest pain: Secondary | ICD-10-CM

## 2017-01-16 DIAGNOSIS — R002 Palpitations: Secondary | ICD-10-CM

## 2017-01-16 MED ORDER — PROPRANOLOL HCL 10 MG PO TABS: 10.0000 mg | ORAL_TABLET | ORAL | 3 refills | Status: DC | PRN

## 2017-01-16 NOTE — Patient Instructions (Addendum)
Medication Instructions:  Your physician has recommended you make the following change in your medication:  1-Inderal 10 mg by mouth as needed for palpitations.  Labwork: Your physician recommends that you have lab work today- TSH and T4  Testing/Procedures: Your physician has requested that you have an exercise tolerance test. For further information please visit [email or web address]. Please also follow instruction sheet, as given.  Your physician has recommended that you wear an event monitor. Event monitors are medical devices that record the heart's electrical activity. Doctors most often [place] these monitors to diagnose arrhythmias. Arrhythmias are problems with the speed or rhythm of the heartbeat. The monitor is a small, portable device. You can wear one while you do your normal daily activities. This is usually used to diagnose what is causing palpitations/syncope (passing out).  Follow-Up: Your physician wants you to follow-up in: 6 to 8 weeks with Dr. Johnsie Cancel.    If you need a refill on your cardiac medications before your next appointment, please call your pharmacy.

## 2017-01-17 LAB — T4, FREE: Free T4: 1.53 ng/dL (ref 0.82–1.77)

## 2017-01-17 LAB — TSH: TSH: 0.533 u[IU]/mL (ref 0.450–4.500)

## 2017-01-24 ENCOUNTER — Ambulatory Visit (INDEPENDENT_AMBULATORY_CARE_PROVIDER_SITE_OTHER): Payer: 59

## 2017-01-24 DIAGNOSIS — R079 Chest pain, unspecified: Secondary | ICD-10-CM

## 2017-01-24 DIAGNOSIS — R002 Palpitations: Secondary | ICD-10-CM

## 2017-01-24 LAB — EXERCISE TOLERANCE TEST
CHL CUP RESTING HR STRESS: 67 {beats}/min
CHL CUP STRESS STAGE 1 GRADE: 0 %
CHL CUP STRESS STAGE 1 HR: 77 {beats}/min
CHL CUP STRESS STAGE 1 SBP: 148 mmHg
CHL CUP STRESS STAGE 2 GRADE: 0 %
CHL CUP STRESS STAGE 2 HR: 79 {beats}/min
CHL CUP STRESS STAGE 2 SPEED: 1 mph
CHL CUP STRESS STAGE 4 DBP: 86 mmHg
CHL CUP STRESS STAGE 4 GRADE: 10 %
CHL CUP STRESS STAGE 4 SBP: 184 mmHg
CHL CUP STRESS STAGE 4 SPEED: 1.7 mph
CHL CUP STRESS STAGE 5 GRADE: 12 %
CHL CUP STRESS STAGE 5 HR: 151 {beats}/min
CHL CUP STRESS STAGE 5 SBP: 183 mmHg
CHL CUP STRESS STAGE 6 HR: 155 {beats}/min
CHL CUP STRESS STAGE 6 SPEED: 3.4 mph
CHL CUP STRESS STAGE 7 DBP: 83 mmHg
CHL CUP STRESS STAGE 7 GRADE: 0 %
CHL CUP STRESS STAGE 7 SBP: 167 mmHg
CHL CUP STRESS STAGE 8 DBP: 78 mmHg
CHL CUP STRESS STAGE 8 GRADE: 0 %
CHL CUP STRESS STAGE 8 SPEED: 0 mph
CSEPPHR: 155 {beats}/min
Estimated workload: 8.5 METS
Exercise duration (min): 7 min
Exercise duration (sec): 0 s
MPHR: 162 {beats}/min
Percent HR: 96 %
Percent of predicted max HR: 95 %
RPE: 16
Stage 1 DBP: 84 mmHg
Stage 1 Speed: 0 mph
Stage 3 Grade: 0 %
Stage 3 HR: 79 {beats}/min
Stage 3 Speed: 1 mph
Stage 4 HR: 123 {beats}/min
Stage 5 DBP: 89 mmHg
Stage 5 Speed: 2.5 mph
Stage 6 Grade: 14 %
Stage 7 HR: 141 {beats}/min
Stage 7 Speed: 0 mph
Stage 8 HR: 101 {beats}/min
Stage 8 SBP: 134 mmHg

## 2017-01-27 ENCOUNTER — Telehealth: Payer: Self-pay | Admitting: Cardiovascular Disease

## 2017-01-27 NOTE — Telephone Encounter (Signed)
Follow Up: ° ° ° °Returning your call from this morning. °

## 2017-01-27 NOTE — Telephone Encounter (Signed)
Called patient with results.  

## 2017-03-12 NOTE — Progress Notes (Signed)
Cardiology Office Note   Date:  03/13/2017   ID:  THEODORE RAHRIG, DOB September 06, 1961, MRN [id number]  PCP:  Bartholome Bill, MD  Cardiologist:  Dr. Johnsie Cancel     Chief Complaint  Patient presents with  . Chest Pain  . Palpitations      History of Present Illness: Alexandria Bradley is a 56 y.o. female who presents for exercise stress follow up.   She was recently see by Dr. Johnsie Cancel for chest pain, has been seen previously for palpitations and had negative monitor and normal stress echo test.   In 2015 f/u echo due to chemo evaluation. That she had for brest cancer.  EF was normal 60-65% aortic root 3.8 cm.   She was seen in ER 11/2016 for chest pain, CRF, HTN, DM, and elevated lipids.  She had palpitations tightness in her chest and dyspnea.  There no EKG changes and CXR NAD.  No arrhthymias on tele. The SSCP occurred after palpitations.    She was concerned because her mother and Aunt have a fib.   Dr. Johnsie Cancel ordered ETT with normal ECG and monitor with SR with rare PVCs, no significant arrhythmia.  The ETT was normal.  She walked for 7 min reaching 96% pred max HR without ischemia.   Today feels back to normal.  No chest pain and only her usual palpitations.  She rarely takes inderal.  She has lost 20 lbs since March.  She has been trying and is now exercising.    Past Medical History:  Diagnosis Date  . Anxiety   . Arthritis   . Back pain   . Breast cancer ([place])   . Breast cancer ([place])    left  . Family history of bladder cancer   . Family history of uterine cancer   . Headache(784.0)    PMH : migraines  . Hypercholesterolemia   . Hypertension   . Hypothyroidism   . Kidney stones   . Osteoporosis   . PONV (postoperative nausea and vomiting)   . Psoriasis   . PVC's (premature ventricular contractions)   . Radiation 08/18/14-10/07/14   Left breast  . Thyroid disease     Past Surgical History:  Procedure Laterality Date  . BREAST LUMPECTOMY WITH AXILLARY LYMPH  NODE BIOPSY  6/15   left  . CHOLECYSTECTOMY    . COLONOSCOPY N/A 06/27/2014   Procedure: COLONOSCOPY;  Surgeon: Lear Ng, MD;  Location: [facility] ENDOSCOPY;  Service: Endoscopy;  Laterality: N/A;  . LIPOMA EXCISION    . MYOMECTOMY    . PORT-A-CATH REMOVAL N/A 02/03/2015   Procedure: REMOVAL PORT-A-CATH;  Surgeon: [name], MD;  Location: [place];  Service: General;  Laterality: N/A;  . PORTACATH PLACEMENT N/A 03/01/2014   Procedure: INSERTION PORT-A-CATH;  Surgeon: [name], MD;  Location: [place];  Service: General;  Laterality: N/A;  . TONSILLECTOMY    . TUBAL LIGATION       Current Outpatient Prescriptions  Medication Sig Dispense Refill  . acetaminophen (TYLENOL) 500 MG tablet Take 1,000 mg by mouth every 6 (six) hours as needed for headache. Reported on 12/28/2015    . amLODipine (NORVASC) 5 MG tablet Take 5 mg by mouth daily.     Marland Kitchen liothyronine (CYTOMEL) 5 MCG tablet Take 5 mcg by mouth daily.     Marland Kitchen loratadine-pseudoephedrine (CLARITIN-D 24-HOUR) 10-240 MG 24 hr tablet Take 1 tablet by mouth daily.    . metFORMIN (GLUCOPHAGE) 500 MG  tablet Take 1,000 mg by mouth every evening.     . propranolol (INDERAL) 10 MG tablet Take 1 tablet (10 mg total) by mouth as needed (palpitations). 90 tablet 3  . TIROSINT 112 MCG CAPS TAKE ONE CAPSULE BY MOUTH ON AN EMPTY STOMACH IN THE MORNING  2  . triamterene-hydrochlorothiazide (MAXZIDE-25) 37.5-25 MG per tablet Take 1 tablet by mouth daily.      No current facility-administered medications for this visit.     Allergies:   Ciprofloxacin; Prednisone; Codeine; Cortisone; and Chlorhexidine    Social History:  The patient  reports that she has quit smoking. Her smoking use included Cigarettes. She has a 7.50 pack-year smoking history. She has never used smokeless tobacco. She reports that she drinks alcohol. She reports that she does not use drugs.   Family History:  The patient's family  history includes Bladder Cancer in her maternal uncle; Brain cancer in her father; Endometrial cancer (age of onset: 60) in her mother; Heart disease in her maternal aunt; Lung cancer in her father and maternal uncle; Multiple myeloma in her maternal uncle; Other in her son; Stroke in her maternal grandfather and maternal grandmother.    ROS:  General:no colds or fevers, no weight changes Skin:no rashes or ulcers HEENT:no blurred vision, no congestion CV:see HPI PUL:see HPI GI:no diarrhea constipation or melena, no indigestion GU:no hematuria, no dysuria MS:no joint pain, no claudication Neuro:no syncope, no lightheadedness Endo:no diabetes, no thyroid disease  Wt Readings from Last 3 Encounters:  03/13/17 172 lb 12.8 oz (78.4 kg)  01/16/17 179 lb 12.8 oz (81.6 kg)  12/03/16 192 lb (87.1 kg)     PHYSICAL EXAM: VS:  BP 120/80 (BP Location: Right Arm, Patient Position: Sitting, Cuff Size: Normal)   Pulse 62   Ht 5' 5"  (1.651 m)   Wt 172 lb 12.8 oz (78.4 kg)   SpO2 97%   BMI 28.76 kg/m  , BMI Body mass index is 28.76 kg/m. General:Pleasant affect, NAD Skin:Warm and dry, brisk capillary refill HEENT:normocephalic, sclera clear, mucus membranes moist Neck:supple, no JVD, no bruits  Heart:S1S2 RRR without murmur, gallup, rub or click Lungs:clear without rales, rhonchi, or wheezes IBB:CWUG, non tender, + BS, do not palpate liver spleen or masses Ext:no lower ext edema, 2+ pedal pulses, 2+ radial pulses Neuro:alert and oriented, MAE, follows commands, + facial symmetry    EKG:  EKG is NOT  ordered today.    Recent Labs: 06/20/2016: ALT 60 12/03/2016: BUN 16; Creatinine, Ser 0.66; Hemoglobin 14.4; Platelets 370; Potassium 3.4; Sodium 138 01/16/2017: TSH 0.533    Lipid Panel    Component Value Date/Time   CHOL 243 (HH) 01/12/2008 0815   TRIG 198 (H) 01/12/2008 0815   TRIG 209 (HH) 09/22/2006 1056   HDL 38.4 (L) 01/12/2008 0815   CHOLHDL 6.3 CALC 01/12/2008 0815   VLDL 40  01/12/2008 0815   LDLDIRECT 175.2 01/12/2008 0815       Other studies Reviewed: Additional studies/ records that were reviewed today include: . 30 day event monitor  Study Highlights   NSR Rare PVC;s No significant arrhythmias   ETT 01/24/17  Study Highlights    Pt walked for 7:00 of a standard [name] protocol GXT. Peak HR of 155 which is 96% predicted maximal HR  There were no ST or T wave changes to suggest ischemia  Blood pressure demonstrated a normal response to exercise.  Negative GXT    ASSESSMENT AND PLAN:  1. Palpitations back to her normal extra  beats, she has inderal prn if needed. She will follow up with Dr. Johnsie Cancel in a year unless problems prior to that time.   2.  SSCP no further episodes ETT is normal, reassured.  3. HTN , Essential controlled.  4. DM-2 followed by PCP and 20 lb wt loss   Current medicines are reviewed with the patient today.  The patient Has no concerns regarding medicines.  The following changes have been made:  See above Labs/ tests ordered today include:see above  Disposition:   FU:  see above  Signed, Alexandria Kicks, NP  03/13/2017 10:20 AM    [facility], [place], [place], [place] Phone: [phone]; Fax: [phone]

## 2017-03-13 ENCOUNTER — Ambulatory Visit (INDEPENDENT_AMBULATORY_CARE_PROVIDER_SITE_OTHER): Payer: 59 | Admitting: Cardiology

## 2017-03-13 ENCOUNTER — Encounter: Payer: Self-pay | Admitting: Cardiology

## 2017-03-13 VITALS — BP 120/80 | HR 62 | Ht 65.0 in | Wt 172.8 lb

## 2017-03-13 DIAGNOSIS — I10 Essential (primary) hypertension: Secondary | ICD-10-CM

## 2017-03-13 DIAGNOSIS — R002 Palpitations: Secondary | ICD-10-CM | POA: Diagnosis not present

## 2017-03-13 DIAGNOSIS — R079 Chest pain, unspecified: Secondary | ICD-10-CM

## 2017-03-13 NOTE — Patient Instructions (Signed)
Medication Instructions:   Your physician recommends that you continue on your current medications as directed. Please refer to the Current Medication list given to you today.   If you need a refill on your cardiac medications before your next appointment, please call your pharmacy.  Labwork: NONE ORDERED  TODAY    Testing/Procedures: NONE ORDERED  TODAY    Follow-Up: Your physician wants you to follow-up in: ONE YEAR WITH NISHAN  You will receive a reminder letter in the mail two months in advance. If you don't receive a letter, please call our office to schedule the follow-up appointment.     Any Other Special Instructions Will Be Listed Below (If Applicable).                                                                                                                                                   

## 2017-06-19 ENCOUNTER — Other Ambulatory Visit (HOSPITAL_BASED_OUTPATIENT_CLINIC_OR_DEPARTMENT_OTHER): Payer: Self-pay

## 2017-06-19 ENCOUNTER — Telehealth: Payer: Self-pay | Admitting: Oncology

## 2017-06-19 ENCOUNTER — Ambulatory Visit (HOSPITAL_BASED_OUTPATIENT_CLINIC_OR_DEPARTMENT_OTHER): Payer: 59 | Admitting: Oncology

## 2017-06-19 VITALS — BP 123/75 | HR 61 | Temp 98.1°F | Resp 19 | Ht 65.0 in | Wt 171.1 lb

## 2017-06-19 DIAGNOSIS — Z853 Personal history of malignant neoplasm of breast: Secondary | ICD-10-CM

## 2017-06-19 DIAGNOSIS — C50212 Malignant neoplasm of upper-inner quadrant of left female breast: Secondary | ICD-10-CM

## 2017-06-19 DIAGNOSIS — Z171 Estrogen receptor negative status [ER-]: Principal | ICD-10-CM

## 2017-06-19 LAB — CBC WITH DIFFERENTIAL/PLATELET
BASO%: 0.7 % (ref 0.0–2.0)
BASOS ABS: 0.1 10*3/uL (ref 0.0–0.1)
EOS%: 2.5 % (ref 0.0–7.0)
Eosinophils Absolute: 0.2 10*3/uL (ref 0.0–0.5)
HEMATOCRIT: 40.1 % (ref 34.8–46.6)
HEMOGLOBIN: 13.6 g/dL (ref 11.6–15.9)
LYMPH#: 2 10*3/uL (ref 0.9–3.3)
LYMPH%: 24 % (ref 14.0–49.7)
MCH: 31 pg (ref 25.1–34.0)
MCHC: 34 g/dL (ref 31.5–36.0)
MCV: 91.4 fL (ref 79.5–101.0)
MONO#: 0.6 10*3/uL (ref 0.1–0.9)
MONO%: 7.6 % (ref 0.0–14.0)
NEUT%: 65.2 % (ref 38.4–76.8)
NEUTROS ABS: 5.4 10*3/uL (ref 1.5–6.5)
Platelets: 309 10*3/uL (ref 145–400)
RBC: 4.39 10*6/uL (ref 3.70–5.45)
RDW: 13.5 % (ref 11.2–14.5)
WBC: 8.2 10*3/uL (ref 3.9–10.3)

## 2017-06-19 LAB — COMPREHENSIVE METABOLIC PANEL
ALBUMIN: 4.4 g/dL (ref 3.5–5.0)
ALK PHOS: 86 U/L (ref 40–150)
ALT: 22 U/L (ref 0–55)
AST: 15 U/L (ref 5–34)
Anion Gap: 11 mEq/L (ref 3–11)
BILIRUBIN TOTAL: 0.26 mg/dL (ref 0.20–1.20)
BUN: 18.4 mg/dL (ref 7.0–26.0)
CALCIUM: 10.2 mg/dL (ref 8.4–10.4)
CO2: 25 mEq/L (ref 22–29)
CREATININE: 0.8 mg/dL (ref 0.6–1.1)
Chloride: 105 mEq/L (ref 98–109)
EGFR: 85 mL/min/{1.73_m2} — ABNORMAL LOW (ref >90)
Glucose: 82 mg/dl (ref 70–140)
Potassium: 3.5 mEq/L (ref 3.5–5.1)
Sodium: 140 mEq/L (ref 136–145)
TOTAL PROTEIN: 7.7 g/dL (ref 6.4–8.3)

## 2017-06-19 NOTE — Telephone Encounter (Signed)
Scheduled appts per 9/20 los - spoke with patient about this. Did not want avs or calendar.

## 2017-06-19 NOTE — Progress Notes (Signed)
[facility]  [phone] [phone]     ID: [name] OB: Apr 15, 1961  MR#: [id number]  JJO#:[id number]  PCP: Bartholome Bill, MD GYN:  Brien Few MDSU: [name] OTHER MD: [name], Delrae Rend, Wilford Corner, Dorna Leitz  CHIEF COMPLAINT: Triple negative breast cancer  CURRENT TREATMENT: Observation  BREAST CANCER HISTORY: From the original intake note of 02/16/2014:  [name] palpated a mass in her left breast early May and brought it immediately to her gynecologist attention. He set her up for bilateral diagnostic mammography and left ultrasonography at [facility] 02/07/2014. Mammography showed a 1.3 cm oval mass with spiculated margins in the left breast at the 11:00 position. This was palpable. Ultrasound showed a 1.1 cm tall her than wide mass with a microlobulated margins. He was hypoechoic. There were no abnormalities noted in the left axilla.  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 Lyndon Code at conference 02/16/2014), triple negative, with an MIB-1 of 50%.  The patient's subsequent history is as detailed below  INTERVAL HISTORY: Alexandria Bradley returns today for follow-up of her triple negative breast cancer. She is under observation alone. She reports that she is doing well overall.   Pt notes that she has diarrhea with up to 3-6 episodes daily that she self-treats this had improved but now it's back. She is also having the right lower quadrant pain more frequently. She has not discussed this with [name].. She states that she has had a colonoscopy in 2015.   She states that she has had recurrent daily headaches that she has been self-treating. She reports that she had laser iridotomy completed for preventative measures against glaucoma. Pt notes that she regularly visits her optometrist with her  last appointment being in March, 2018. Pt reports that her opthalmologist is Dr. Julian Reil at [facility]. Pt has an upcoming appointment with her ophthalmologist. She denies any sinus problems. There has been no problems with balance or falls and she has mild nausea, which she attributes to her GI problems.  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 Pt states that she enjoys this job. Pt reports that her family is doing well, but that her husband was recently diagnosed and admitted to the hospital in August 2018 with viral meningitis. Pt notes that her husband is improving. Because of this however she has not been able to do her usual couple of miles a day walks  REVIEW OF SYSTEMS: She states that she has lost weight by cutting out carbs and through ambulating 2.5 miles 3-4 times a week. She has had some pains in her feet, which are very intermittent, sharp, and not related to walking but possibly related to which shoes she has been wearing. It gets worse when she uses flip-flop's, which incidentally she is wearing today.. She denies unusual headaches, visual changes, nausea, vomiting, or dizziness. There has been no unusual cough, phlegm production, or pleurisy. This been no change in bowel or bladder habits. She denies unexplained fatigue or unexplained weight loss, bleeding, rash, or fever. A detailed review of systems was otherwise negative.   PAST MEDICAL HISTORY: Past Medical History:  Diagnosis Date  . Anxiety   . Arthritis   . Back pain   . Breast cancer ([place])   . Breast cancer ([place])  left  . Family history of bladder cancer   . Family history of uterine cancer   . Headache(784.0)    PMH : migraines  . Hypercholesterolemia   . Hypertension   . Hypothyroidism   . Kidney stones   . Osteoporosis   . PONV (postoperative nausea and vomiting)   . Psoriasis   . PVC's (premature ventricular contractions)   .  Radiation 08/18/14-10/07/14   Left breast  . Thyroid disease     PAST SURGICAL HISTORY: Past Surgical History:  Procedure Laterality Date  . BREAST LUMPECTOMY WITH AXILLARY LYMPH NODE BIOPSY  6/15   left  . CHOLECYSTECTOMY    . COLONOSCOPY N/A 06/27/2014   Procedure: COLONOSCOPY;  Surgeon: Lear Ng, MD;  Location: [facility] ENDOSCOPY;  Service: Endoscopy;  Laterality: N/A;  . LIPOMA EXCISION    . MYOMECTOMY    . PORT-A-CATH REMOVAL N/A 02/03/2015   Procedure: REMOVAL PORT-A-CATH;  Surgeon: [name], MD;  Location: [place];  Service: General;  Laterality: N/A;  . PORTACATH PLACEMENT N/A 03/01/2014   Procedure: INSERTION PORT-A-CATH;  Surgeon: [name], MD;  Location: [facility];  Service: General;  Laterality: N/A;  . TONSILLECTOMY    . TUBAL LIGATION      FAMILY HISTORY Family History  Problem Relation Age of Onset  . Endometrial cancer Mother 75  . Lung cancer Father   . Brain cancer Father   . Heart disease Maternal Aunt   . Lung cancer Maternal Uncle   . Stroke Maternal Grandmother   . Stroke Maternal Grandfather   . Bladder Cancer Maternal Uncle   . Multiple myeloma Maternal Uncle   . Other Son        trisomy 27   the patient's father died at the age of 47 from what may have been metastatic lung cancer. The patient knows very little about the father's side of her family. Her mother is alive at age 26. She was recently diagnosed with endometrial cancer. The patient has one brother, 2 sisters. There is no history of breast or ovarian cancer in the family.  GYNECOLOGIC HISTORY:  Menarche age 34, first live birth age 72. The patient is GX P3. One child died shortly after birth. She stopped having periods approximately 2005. She did not use hormone replacement. 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 Her husband  [name] is a Insurance underwriter. Son [name] is  Nurse, adult in [place]. Daughter [name] lives in [place] and works for [employer] as an Therapist, sports.    ADVANCED DIRECTIVES: Not in place   HEALTH MAINTENANCE: Social History  Substance Use Topics  . Smoking status: Former Smoker    Packs/day: 0.50    Years: 15.00    Types: Cigarettes  . Smokeless tobacco: Never Used     Comment: Quit smoking cigarettes in 2002  . Alcohol use Yes     Comment: social     Colonoscopy: 2010  PAP: 2013  Bone density: 05/15/2009, at [facility], normal, lowest T score -0.8  Lipid panel:  Allergies  Allergen Reactions  . Ciprofloxacin Anxiety and Rash  . Prednisone Shortness Of Breath and Other (See Comments)    "jacks me up" jittery  . Codeine Nausea Only and Other (See Comments)    Nausea   . Cortisone Swelling and Other (See Comments)    "jack me  up"  . Chlorhexidine Itching    Current Outpatient Prescriptions  Medication Sig Dispense Refill  . acetaminophen (TYLENOL) 500 MG tablet Take 1,000 mg by mouth every 6 (six) hours as needed for headache. Reported on 12/28/2015    . amLODipine (NORVASC) 5 MG tablet Take 5 mg by mouth daily.     Marland Kitchen liothyronine (CYTOMEL) 5 MCG tablet Take 5 mcg by mouth daily.     Marland Kitchen loratadine-pseudoephedrine (CLARITIN-D 24-HOUR) 10-240 MG 24 hr tablet Take 1 tablet by mouth daily.    . metFORMIN (GLUCOPHAGE) 500 MG tablet Take 1,000 mg by mouth every evening.     . propranolol (INDERAL) 10 MG tablet Take 1 tablet (10 mg total) by mouth as needed (palpitations). 90 tablet 3  . TIROSINT 112 MCG CAPS TAKE ONE CAPSULE BY MOUTH ON AN EMPTY STOMACH IN THE MORNING  2  . triamterene-hydrochlorothiazide (MAXZIDE-25) 37.5-25 MG per tablet Take 1 tablet by mouth daily.      No current facility-administered medications for this visit.     OBJECTIVE: Middle-aged white woman who appears stated age  56:   06/19/17 1335  BP: 123/75  Pulse: 61  Resp: 19  Temp:  98.1 F (36.7 C)  SpO2: 98%     Body mass index is 28.47 kg/m.    ECOG FS:1 - Symptomatic but completely ambulatory  Sclerae unicteric, EOMs intact Oropharynx clear and moist No cervical or supraclavicular adenopathy Lungs no rales or rhonchi Heart regular rate and rhythm Abd soft, nontender, positive bowel sounds MSK no focal spinal tenderness, no upper extremity lymphedema Neuro: nonfocal, well oriented, appropriate affect Breasts: The right breast is benign. The left breast is status post lumpectomy followed by radiation with no evidence of local recurrence. Both axillae are benign.  LAB RESULTS: No results found for: SPEP  Lab Results  Component Value Date   WBC 8.2 06/19/2017   NEUTROABS 5.4 06/19/2017   HGB 13.6 06/19/2017   HCT 40.1 06/19/2017   MCV 91.4 06/19/2017   PLT 309 06/19/2017      Chemistry      Component Value Date/Time   NA 140 06/19/2017 1312   K 3.5 06/19/2017 1312   CL 105 12/03/2016 1409   CO2 25 06/19/2017 1312   BUN 18.4 06/19/2017 1312   CREATININE 0.8 06/19/2017 1312      Component Value Date/Time   CALCIUM 10.2 06/19/2017 1312   ALKPHOS 86 06/19/2017 1312   AST 15 06/19/2017 1312   ALT 22 06/19/2017 1312   BILITOT 0.26 06/19/2017 1312       No results found for: LABCA2  No components found for: LABCA125  No results for input(s): INR in the last 168 hours.  Urinalysis    Component Value Date/Time   COLORURINE YELLOW 03/13/2009 2251   APPEARANCEUR CLOUDY (A) 03/13/2009 2251   LABSPEC 1.020 06/15/2014 1101   PHURINE 6.0 06/15/2014 1101   PHURINE 5.5 03/13/2009 2251   GLUCOSEU Negative 06/15/2014 1101   HGBUR Negative 06/15/2014 1101   HGBUR NEGATIVE 03/13/2009 2251   BILIRUBINUR Color Interference 06/15/2014 1101   KETONESUR Negative 06/15/2014 1101   KETONESUR NEGATIVE 03/13/2009 2251   PROTEINUR 30 06/15/2014 1101   PROTEINUR NEGATIVE 03/13/2009 2251   UROBILINOGEN 0.2 06/15/2014 1101   NITRITE Negative 06/15/2014 1101    NITRITE NEGATIVE 03/13/2009 2251   LEUKOCYTESUR Trace 06/15/2014 1101    STUDIES: Chest x-ray 12/03/2016 for evaluation of right-sided chest pain found no acute abnormality.  ASSESSMENT: 56 y.o. [place] woman  status post left breast upper inner quadrant biopsy 02/08/2014 for a clinical T1c N0, stage IA invasive ductal carcinoma, grade 3, triple negative, with an MIB-1 of 50%.  (1) status post left lumpectomy and sentinel lymph node sampling 03/01/2014 for a pT1c pN0, stage IA invasive ductal carcinoma, grade 3, with negative margins, repeat prognostic panel again triple negative.  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  Left breast with breath hold technique/ 45 Gy at 1.8 Gy per fraction x 25 fractions.   Left breast boost/ 16 Gy at 2 Gy per fraction x 8 fractions  (4) genetics counseling 07/06/2015 through the Breast/Ovarian cancer gene panel offered by [facility] found no deleterious mutations in ATM, BARD1, BRCA1, BRCA2, BRIP1, CDH1, CHEK2, EPCAM, FANCC, MLH1, MSH2, MSH6, NBN, PALB2, PMS2, PTEN, RAD51C, RAD51D, TP53, and XRCC2.  (5) question of sleep apnea  (6) transverse colon lesion noted on CT scan from 06/17/2014 - negative biopsy, also showed fatty liver  (a) repeat CT scan of the abdomen and pelvis with contrast 02/15/2016 negative  PLAN: Kasha is now a little over 3 years out from definitive surgery for her breast cancer with no evidence of disease recurrence. This is very favorable, especially since triple negative breast cancers if  they're going to occur tend to recur early.  I don't believe her headaches are gone to be related to her cancer. She has had multiple eye issues and I would start with her ophthalmologist trying to clear that. She does not have any sinus symptoms but sometimes there can be some sinus pressure with minimal symptomatology. If the eye exam does not improve the problems I would suggest she try Claritin or similar drug for 2 or 3 weeks. Of course if things become more intense and more persistent she will let me know and we can always obtain some brain films.  I'm not sure exactly what is going on with her GI system. This is fairly chronic, on and off him a. If it does not improve as it doesn't seem to be improving, I suggested she see [name] again.  As far as the feet is concerned she really needs to find better shoes. I think that will take care of that problem.  She will see me again in one year. She knows to call for any problems that may develop before that visit.  Ariana Cavenaugh, Virgie Dad, MD  06/19/17 2:14 PM Medical Oncology and Hematology [facility] [address] [place], [place] [address] Tel. [phone]    Fax. [phone]  This document serves as a record of services personally performed by Lurline Del, MD. It was created on her behalf by Steva Colder, a trained medical scribe. The creation of this record is based on the scribe's personal observations and the provider's statements to them. This document has been checked and approved by the attending provider.

## 2017-12-11 ENCOUNTER — Inpatient Hospital Stay: Payer: 59 | Attending: Adult Health | Admitting: Adult Health

## 2017-12-11 ENCOUNTER — Encounter: Payer: Self-pay | Admitting: Adult Health

## 2017-12-11 ENCOUNTER — Telehealth: Payer: Self-pay | Admitting: *Deleted

## 2017-12-11 VITALS — BP 137/87 | HR 77 | Temp 98.1°F | Resp 18 | Ht 65.0 in | Wt 177.2 lb

## 2017-12-11 DIAGNOSIS — Z9221 Personal history of antineoplastic chemotherapy: Secondary | ICD-10-CM

## 2017-12-11 DIAGNOSIS — Z9049 Acquired absence of other specified parts of digestive tract: Secondary | ICD-10-CM | POA: Insufficient documentation

## 2017-12-11 DIAGNOSIS — C50212 Malignant neoplasm of upper-inner quadrant of left female breast: Secondary | ICD-10-CM | POA: Insufficient documentation

## 2017-12-11 DIAGNOSIS — I10 Essential (primary) hypertension: Secondary | ICD-10-CM | POA: Insufficient documentation

## 2017-12-11 DIAGNOSIS — E039 Hypothyroidism, unspecified: Secondary | ICD-10-CM | POA: Insufficient documentation

## 2017-12-11 DIAGNOSIS — Z923 Personal history of irradiation: Secondary | ICD-10-CM

## 2017-12-11 DIAGNOSIS — G4733 Obstructive sleep apnea (adult) (pediatric): Secondary | ICD-10-CM | POA: Insufficient documentation

## 2017-12-11 DIAGNOSIS — E785 Hyperlipidemia, unspecified: Secondary | ICD-10-CM | POA: Diagnosis not present

## 2017-12-11 DIAGNOSIS — Z87891 Personal history of nicotine dependence: Secondary | ICD-10-CM | POA: Diagnosis not present

## 2017-12-11 DIAGNOSIS — K760 Fatty (change of) liver, not elsewhere classified: Secondary | ICD-10-CM | POA: Insufficient documentation

## 2017-12-11 DIAGNOSIS — Z171 Estrogen receptor negative status [ER-]: Secondary | ICD-10-CM | POA: Diagnosis present

## 2017-12-11 DIAGNOSIS — K639 Disease of intestine, unspecified: Secondary | ICD-10-CM | POA: Diagnosis not present

## 2017-12-11 DIAGNOSIS — Z853 Personal history of malignant neoplasm of breast: Secondary | ICD-10-CM

## 2017-12-11 DIAGNOSIS — N6321 Unspecified lump in the left breast, upper outer quadrant: Secondary | ICD-10-CM | POA: Diagnosis not present

## 2017-12-11 DIAGNOSIS — F4323 Adjustment disorder with mixed anxiety and depressed mood: Secondary | ICD-10-CM | POA: Insufficient documentation

## 2017-12-11 NOTE — Telephone Encounter (Signed)
Pt called this RN on 3/13 stating onset of new lump found on the outside of her left breast. " it's kinda an odd lump because it has a small lump but then I feel other stuff too "  Area is not tender to touch - swollen or red.  Per discussion - appointment made for eval and assessment with NP for today.  ( note this call occurred during computer down time)  Pt is off today and has another appointment at 12 noon. Appointment made for later today.

## 2017-12-11 NOTE — Assessment & Plan Note (Signed)
Alexandria Bradley is a 57 year old woman with h/o stage IA triple negative IDC.  She underwent lumpectomy, adjuvant chemotherapy, and adjuvant radiation.  She has continued under observation since that time.  I will order a mammogram and ultrasound of the left breast for this new and worsening breast abnormality that she has noticed.  She will have this done at [facility] next week.

## 2017-12-11 NOTE — Progress Notes (Signed)
[facility] Follow up:    Alexandria Bill, MD Alexandria Bradley [id number]   DIAGNOSIS: Cancer Staging Malignant neoplasm of upper-inner quadrant of left breast in female, estrogen receptor negative ([place]) Staging form: Breast, AJCC 7th Edition - Clinical: Stage IA (T1c, N0, cM0) - Unsigned Staging comments: Staged at breast conference 5.20.15  - Pathologic: No stage assigned - Unsigned   SUMMARY OF ONCOLOGIC HISTORY: Per Dr. Virgie Dad last note 06/19/2017: Alexandria Bradley woman status post left breast upper inner quadrant biopsy 02/08/2014 for a clinical T1c N0, stage IA invasive ductal carcinoma, grade 3, triple negative, with an MIB-1 of 50%.  (1) status post left lumpectomy and sentinel lymph node sampling 03/01/2014 for a pT1c pN0, stage IA invasive ductal carcinoma, grade 3, with negative margins, repeat prognostic panel again triple negative.  (2) adjuvant chemotherapy started a 03/25/2014 consisting of cyclophosphamide and doxorubicin given in dose dense fashion x4, completed 05/06/2014; followed by weekly paclitaxel x1, on 05/27/2014, poorly tolerated;              (a) switched to Abraxane starting 06/10/2014, with 11 Abraxane doses planned             (b) dose reduced by 20% because of neutropenia starting 07/01/2014 dose             (c) discontinued after 4th Abraxane dose 07/15/2014 due to neuropathy  (3) adjuvant radiation completed 10/07/2014.             Left breast with breath hold technique/ 45 Gy at 1.8 Gy per fraction x 25 fractions.              Left breast boost/ 16 Gy at 2 Gy per fraction x 8 fractions  (4) genetics counseling 07/06/2015 through the Breast/Ovarian cancer gene panel offered by [facility] found no deleterious mutations in ATM, BARD1, BRCA1, BRCA2, BRIP1, CDH1, CHEK2, EPCAM, FANCC, MLH1, MSH2, MSH6, NBN, PALB2, PMS2, PTEN, RAD51C, RAD51D, TP53, and XRCC2.  (5) question of sleep apnea  (6)  transverse colon lesion noted on CT scan from 06/17/2014 - negative biopsy, also showed fatty liver             (a) repeat CT scan of the abdomen and pelvis with contrast 02/15/2016 negative  CURRENT THERAPY: observation  INTERVAL HISTORY: Alexandria Bradley 57 y.o. female returns for evaluation of thickening in her left upper outer quadrant of her breast.  Her husband feels like it is getting worse and wants her to be checked out.     Patient Active Problem List   Diagnosis Date Noted  . OSA (obstructive sleep apnea) 08/20/2016  . Snoring 08/20/2016  . Adjustment disorder with mixed anxiety and depressed mood 08/20/2016  . Genetic testing 07/07/2015  . Family history of uterine cancer   . Family history of bladder cancer   . Abnormal AST and ALT 07/08/2014  . Fatty infiltration of liver 07/08/2014  . RUQ pain 06/27/2014  . Nonspecific (abnormal) findings on radiological and other examination of gastrointestinal tract 06/27/2014  . Fever 06/15/2014  . Diarrhea 06/10/2014  . Chemotherapy-induced nausea 06/10/2014  . Abdominal pain 06/10/2014  . Drug induced neutropenia(288.03) 04/21/2014  . Malignant neoplasm of upper-inner quadrant of left breast in female, estrogen receptor negative ([place]) 02/11/2014  . OTHER CHEST PAIN 05/21/2010  . HYPERLIPIDEMIA 05/11/2010  . PSORIASIS 05/11/2010  . PALPITATIONS 05/11/2010  . Nonspecific (abnormal) findings on radiological and other examination of body structure 05/11/2010  .  CHEST XRAY, ABNORMAL 05/11/2010  . HYPOTHYROIDISM 05/10/2010  . EPIGASTRIC PAIN 05/10/2010  . CONJUNCTIVITIS, ALLERGIC 02/18/2008  . MALAISE AND FATIGUE 01/11/2008  . Essential hypertension 09/02/2007  . SINUSITIS- ACUTE-NOS 09/02/2007  . Acute upper respiratory infection 09/02/2007  . Allergic rhinitis, cause unspecified 09/02/2007    is allergic to ciprofloxacin; prednisone; codeine; cortisone; and chlorhexidine.  MEDICAL HISTORY: Past Medical History:   Diagnosis Date  . Anxiety   . Arthritis   . Back pain   . Breast cancer ([place])   . Breast cancer ([place])    left  . Family history of bladder cancer   . Family history of uterine cancer   . Headache(784.0)    PMH : migraines  . Hypercholesterolemia   . Hypertension   . Hypothyroidism   . Kidney stones   . Osteoporosis   . PONV (postoperative nausea and vomiting)   . Psoriasis   . PVC's (premature ventricular contractions)   . Radiation 08/18/14-10/07/14   Left breast  . Thyroid disease     SURGICAL HISTORY: Past Surgical History:  Procedure Laterality Date  . BREAST LUMPECTOMY WITH AXILLARY LYMPH NODE BIOPSY  6/15   left  . CHOLECYSTECTOMY    . COLONOSCOPY N/A 06/27/2014   Procedure: COLONOSCOPY;  Surgeon: Lear Ng, MD;  Location[facility] ENDOSCOPY;  Service: Endoscopy;  Laterality: N/A;  . LIPOMA EXCISION    . MYOMECTOMY    . PORT-A-CATH REMOVAL N/A 02/03/2015   Procedure: REMOVAL PORT-A-CATH;  Surgeon: [name], MD;  Location: [place];  Service: General;  Laterality: N/A;  . PORTACATH PLACEMENT N/A 03/01/2014   Procedure: INSERTION PORT-A-CATH;  Surgeon: [name], MD;  Location: [place];  Service: General;  Laterality: N/A;  . TONSILLECTOMY    . TUBAL LIGATION      SOCIAL HISTORY: Social History   Socioeconomic History  . Marital status: Married    Spouse name: Not on file  . Number of children: 3  . Years of education: Not on file  . Highest education level: Not on file  Social Needs  . Financial resource strain: Not on file  . Food insecurity - worry: Not on file  . Food insecurity - inability: Not on file  . Transportation needs - medical: Not on file  . Transportation needs - non-medical: Not on file  Occupational History  . Not on file  Tobacco Use  . Smoking status: Former Smoker    Packs/day: 0.50    Years: 15.00    Pack years: 7.50    Types: Cigarettes  . Smokeless tobacco: Never Used  .  Tobacco comment: Quit smoking cigarettes in 2002  Substance and Sexual Activity  . Alcohol use: Yes    Comment: social  . Drug use: No  . Sexual activity: Not on file  Other Topics Concern  . Not on file  Social History Narrative  . Not on file    FAMILY HISTORY: Family History  Problem Relation Age of Onset  . Endometrial cancer Mother 29  . Lung cancer Father   . Brain cancer Father   . Heart disease Maternal Aunt   . Lung cancer Maternal Uncle   . Stroke Maternal Grandmother   . Stroke Maternal Grandfather   . Bladder Cancer Maternal Uncle   . Multiple myeloma Maternal Uncle   . Other Son        trisomy 13    Review of Systems  Constitutional: Negative for appetite change, chills, fatigue  and fever.  HENT:   Negative for hearing loss, lump/mass and trouble swallowing.   Eyes: Negative for eye problems and icterus.  Respiratory: Negative for chest tightness, cough and shortness of breath.   Cardiovascular: Negative for chest pain, leg swelling and palpitations.  Gastrointestinal: Negative for abdominal distention, abdominal pain, constipation, diarrhea, nausea and vomiting.  Endocrine: Negative for hot flashes.  Skin: Negative for itching and rash.  Neurological: Negative for dizziness, extremity weakness, headaches and numbness.  Hematological: Negative for adenopathy. Does not bruise/bleed easily.  Psychiatric/Behavioral: Negative for depression. The patient is not nervous/anxious.       PHYSICAL EXAMINATION  ECOG PERFORMANCE STATUS: 1 - Symptomatic but completely ambulatory  Vitals:   12/11/17 1418  BP: 137/87  Pulse: 77  Resp: 18  Temp: 98.1 F (36.7 C)  SpO2: 98%    Physical Exam  Constitutional: She is oriented to person, place, and time and well-developed, well-nourished, and in no distress.  HENT:  Head: Normocephalic and atraumatic.  Mouth/Throat: No oropharyngeal exudate.  Eyes: No scleral icterus.  Neck: Neck supple.  Cardiovascular: Normal  rate, regular rhythm and normal heart sounds.  Pulmonary/Chest: Effort normal and breath sounds normal.  Right breast without nodules, masses, skin or nipple changes, left breast with mild thickening noted at about 1-2 oclock, no definite nodule, dimpling, or other abnormality palpated in the left breast  Abdominal: Soft. Bowel sounds are normal. She exhibits no distension. There is no tenderness.  Lymphadenopathy:    She has no cervical adenopathy.  Neurological: She is alert and oriented to person, place, and time.  Skin: Skin is warm and dry.  Psychiatric: Mood and affect normal.    LABORATORY DATA:  CBC    Component Value Date/Time   WBC 8.2 06/19/2017 1312   WBC 7.7 12/03/2016 1409   RBC 4.39 06/19/2017 1312   RBC 4.72 12/03/2016 1409   HGB 13.6 06/19/2017 1312   HCT 40.1 06/19/2017 1312   PLT 309 06/19/2017 1312   MCV 91.4 06/19/2017 1312   MCH 31.0 06/19/2017 1312   MCH 30.5 12/03/2016 1409   MCHC 34.0 06/19/2017 1312   MCHC 34.0 12/03/2016 1409   RDW 13.5 06/19/2017 1312   LYMPHSABS 2.0 06/19/2017 1312   MONOABS 0.6 06/19/2017 1312   EOSABS 0.2 06/19/2017 1312   BASOSABS 0.1 06/19/2017 1312    CMP     Component Value Date/Time   NA 140 06/19/2017 1312   K 3.5 06/19/2017 1312   CL 105 12/03/2016 1409   CO2 25 06/19/2017 1312   GLUCOSE 82 06/19/2017 1312   GLUCOSE 88 09/22/2006 1056   BUN 18.4 06/19/2017 1312   CREATININE 0.8 06/19/2017 1312   CALCIUM 10.2 06/19/2017 1312   PROT 7.7 06/19/2017 1312   ALBUMIN 4.4 06/19/2017 1312   AST 15 06/19/2017 1312   ALT 22 06/19/2017 1312   ALKPHOS 86 06/19/2017 1312   BILITOT 0.26 06/19/2017 1312   GFRNONAA >60 12/03/2016 1409   GFRAA >60 12/03/2016 1409     ASSESSMENT and PLAN:   Malignant neoplasm of upper-inner quadrant of left breast in female, estrogen receptor negative ([place]) Jazae is a 57 year old woman with h/o stage IA triple negative IDC.  She underwent lumpectomy, adjuvant chemotherapy, and adjuvant  radiation.  She has continued under observation since that time.  I will order a mammogram and ultrasound of the left breast for this new and worsening breast abnormality that she has noticed.  She will have this done at  [facility] next week.     Orders Placed This Encounter  Procedures  . MM DIAG BREAST TOMO UNI LEFT    Standing Status:   Future    Standing Expiration Date:   02/11/2019    Order Specific Question:   Reason for Exam (SYMPTOM  OR DIAGNOSIS REQUIRED)    Answer:   breast thickening at 1-2 oclock    Order Specific Question:   Preferred imaging location?    Answer:   External    Comments:   [facility]    Order Specific Question:   Is the patient pregnant?    Answer:   No  . US BREAST LTD UNI LEFT INC AXILLA    Standing Status:   Future    Standing Expiration Date:   02/11/2019    Order Specific Question:   Reason for Exam (SYMPTOM  OR DIAGNOSIS REQUIRED)    Answer:   left breast thickening, 1-2 oclock    Order Specific Question:   Preferred imaging location?    Answer:   External    Comments:   [facility]    All questions were answered. The patient knows to call the clinic with any problems, questions or concerns. We can certainly see the patient much sooner if necessary.  A total of (20) minutes of face-to-face time was spent with this patient with greater than 50% of that time in counseling and care-coordination.  This note was electronically signed. Scot Dock, NP 12/11/2017

## 2017-12-12 ENCOUNTER — Telehealth: Payer: Self-pay | Admitting: Adult Health

## 2017-12-12 NOTE — Telephone Encounter (Signed)
lind

## 2018-01-02 ENCOUNTER — Other Ambulatory Visit: Payer: Self-pay | Admitting: *Deleted

## 2018-01-02 ENCOUNTER — Other Ambulatory Visit: Payer: Self-pay | Admitting: Oncology

## 2018-01-02 ENCOUNTER — Telehealth: Payer: Self-pay | Admitting: *Deleted

## 2018-01-02 NOTE — Telephone Encounter (Signed)
This RN received a VM from the patient stating she was seen by LCC/NP due to areas of concern in her left breast and set up for dedicated mammo and U/S.  " I went to [facility] and they took it upon themselves to do my usual annual mammogram that was due in May - and did not do an U/S"  " I am furious and will not go back to them but would like to get the U/S done "  This RN reviewed chart and noted orders as above per visit on 12/11/2017.  Per contact with [facility] - verified with Towana orders were received ( she read them back to this RN ) but did not see an explanation as to why U/S not done. She transferred call to radiology tech line - obtained VM. Detailed message per above with request to return call to this RN.  This RN then contacted the patient and informed her of above. Alexandria Bradley stated " you know- just go ahead and cancel my appointment for lab and visit with Dr Jana Hakim in September " " I will just deal with this myself  - the itching and all - and just invest in a lot of cortisone cream "  Alexandria Bradley conversation was brisk and direct.  This RN attempted to validate pt's concerns with patient restating " no - just cancel my appointments ", thank you - goodbye "  This note will be forwarded to LCC/NP and MD for further recommendations including canceling of appointments.

## 2018-05-28 ENCOUNTER — Encounter: Payer: Self-pay | Admitting: Family Medicine

## 2018-05-28 ENCOUNTER — Ambulatory Visit (INDEPENDENT_AMBULATORY_CARE_PROVIDER_SITE_OTHER): Payer: 59 | Admitting: Family Medicine

## 2018-05-28 VITALS — BP 142/98 | HR 72 | Temp 98.1°F | Ht 65.0 in | Wt 184.4 lb

## 2018-05-28 DIAGNOSIS — I10 Essential (primary) hypertension: Secondary | ICD-10-CM | POA: Diagnosis not present

## 2018-05-28 DIAGNOSIS — R7303 Prediabetes: Secondary | ICD-10-CM | POA: Diagnosis not present

## 2018-05-28 DIAGNOSIS — R42 Dizziness and giddiness: Secondary | ICD-10-CM | POA: Insufficient documentation

## 2018-05-28 MED ORDER — FLUTICASONE PROPIONATE 50 MCG/ACT NA SUSP: 2.0000 | Freq: Every day | NASAL | 2 refills | Status: DC

## 2018-05-28 NOTE — Progress Notes (Signed)
Chief Complaint  Patient presents with  . New Patient (Initial Visit)       New Patient Visit SUBJECTIVE: HPI: Alexandria Bradley is an 57 y.o.female who is being seen for establishing care.  The patient was previously seen at  [facility]- changing due to convenience- her specialists are through [facility].  Has had hx of vertigo that has been going on for 2.5 years. Sometimes it feels like she spins and sometimes it will be like she got up too quickly. It usually lasts for around several seconds. It is made worse by movement.   Will intermittently have R ear pain that does not always seem to be related. This has been going on for around 18 mo. She has been taking Claritin which has helped in past, but over past several mo it has not.   Hx of HTN, does have BP monitor at home. Compliant with meds, no AE's. Diet fair, does not exercise.   Allergies  Allergen Reactions  . Ciprofloxacin Anxiety and Rash  . Prednisone Shortness Of Breath and Other (See Comments)    "jacks me up" jittery  . Codeine Nausea Only and Other (See Comments)    Nausea   . Cortisone Swelling and Other (See Comments)    "jack me up"  . Chlorhexidine Itching    Past Medical History:  Diagnosis Date  . Anxiety   . Arthritis   . Back pain   . Breast cancer ([place])    left  . Headache(784.0)    PMH : migraines  . Hypercholesterolemia   . Hypertension   . Hypothyroidism   . Kidney stones   . Osteoporosis   . PONV (postoperative nausea and vomiting)   . Psoriasis   . PVC's (premature ventricular contractions)   . Radiation 08/18/14-10/07/14   Left breast   Past Surgical History:  Procedure Laterality Date  . BREAST LUMPECTOMY WITH AXILLARY LYMPH NODE BIOPSY  6/15   left  . CHOLECYSTECTOMY    . COLONOSCOPY N/A 06/27/2014   Procedure: COLONOSCOPY;  Surgeon: Lear Ng, MD;  Location: [facility] ENDOSCOPY;  Service: Endoscopy;  Laterality: N/A;  . LIPOMA EXCISION    . MYOMECTOMY    . PORT-A-CATH REMOVAL N/A  02/03/2015   Procedure: REMOVAL PORT-A-CATH;  Surgeon: [name], MD;  Location: [place];  Service: General;  Laterality: N/A;  . PORTACATH PLACEMENT N/A 03/01/2014   Procedure: INSERTION PORT-A-CATH;  Surgeon: [name], MD;  Location: [place];  Service: General;  Laterality: N/A;  . TONSILLECTOMY    . TUBAL LIGATION     ROS Cardiovascular: Denies chest pain  Respiratory: Denies dyspnea   OBJECTIVE: BP (!) 142/98 (BP Location: Right Arm, Patient Position: Sitting, Cuff Size: Normal)   Pulse 72   Temp 98.1 F (36.7 C) (Oral)   Ht 5\' 5"  (1.651 m)   Wt 184 lb 6 oz (83.6 kg)   SpO2 97%   BMI 30.68 kg/m   Constitutional: -  VS reviewed -  Well developed, well nourished, appears stated age -  No apparent distress  Psychiatric: -  Oriented to person, place, and time -  Memory intact -  Affect and mood normal -  Fluent conversation, good eye contact -  Judgment and insight age appropriate  Eye: -  Conjunctivae clear, no discharge -  Pupils symmetric, round, reactive to light  ENMT: -  MMM    Pharynx moist, no exudate, no erythema  Neck: -  No gross swelling, no  palpable masses -  Thyroid midline, not enlarged, mobile, no palpable masses  Cardiovascular: -  RRR -  No LE edema  Respiratory: -  Normal respiratory effort, no accessory muscle use, no retraction -  Breath sounds equal, no wheezes, no ronchi, no crackles  Neurological:  -  CN II - XII grossly intact -  DTR's equal and symmetric -  [employer] + on L -  No cerebellar signs  Musculoskeletal: -  No clubbing, no cyanosis -  Gait normal -  5/5 strength throughout  Skin: -  No significant lesion on inspection -  Warm and dry to palpation   ASSESSMENT/PLAN: Vertigo  Essential hypertension  Prediabetes  Pt seen at [facility] and I have access to records. Go back on INCS. Does not sound like labyrinthitis given duration. Epley Maneuvers. YouTube if need more info. If no  improvement, send message and we will get in with vest rehab. Consider ENT if no improvement with that. Patient should return 6 weeks to recheck BP, no changes for now, will check readings at home. The patient voiced understanding and agreement to the plan.   [place], DO 05/28/18  2:13 PM

## 2018-05-28 NOTE — Progress Notes (Signed)
Pre visit review using our clinic review tool, if applicable. No additional management support is needed unless otherwise documented below in the visit note. 

## 2018-05-28 NOTE — Patient Instructions (Addendum)
Go back on the Flonase routinely.  YouTube has good videos over how to do the Epley Maneuvers.  How to Perform the Epley Maneuver The Epley maneuver is an exercise that relieves symptoms of vertigo. Vertigo is the feeling that you or your surroundings are moving when they are not. When you feel vertigo, you may feel like the room is spinning and have trouble walking. Dizziness is a little different than vertigo. When you are dizzy, you may feel unsteady or light-headed. You can do this maneuver at home whenever you have symptoms of vertigo. You can do it up to 3 times a day until your symptoms go away. Even though the Epley maneuver may relieve your vertigo for a few weeks, it is possible that your symptoms will return. This maneuver relieves vertigo, but it does not relieve dizziness. What are the risks? If it is done correctly, the Epley maneuver is considered safe. Sometimes it can lead to dizziness or nausea that goes away after a short time. If you develop other symptoms, such as changes in vision, weakness, or numbness, stop doing the maneuver and call your health care provider. How to perform the Epley maneuver 1. Sit on the edge of a bed or table with your back straight and your legs extended or hanging over the edge of the bed or table. 2. Turn your head halfway toward the affected ear or side. 3. Lie backward quickly with your head turned until you are lying flat on your back. You may want to position a pillow under your shoulders. 4. Hold this position for 30 seconds. You may experience an attack of vertigo. This is normal. 5. Turn your head to the opposite direction until your unaffected ear is facing the floor. 6. Hold this position for 30 seconds. You may experience an attack of vertigo. This is normal. Hold this position until the vertigo stops. 7. Turn your whole body to the same side as your head. Hold for another 30 seconds. 8. Sit back up. You can repeat this exercise up to 3  times a day. Follow these instructions at home:  After doing the Epley maneuver, you can return to your normal activities.  Ask your health care provider if there is anything you should do at home to prevent vertigo. He or she may recommend that you: ? Keep your head raised (elevated) with two or more pillows while you sleep. ? Do not sleep on the side of your affected ear. ? Get up slowly from bed. ? Avoid sudden movements during the day. ? Avoid extreme head movement, like looking up or bending over. Contact a health care provider if:  Your vertigo gets worse.  You have other symptoms, including: ? Nausea. ? Vomiting. ? Headache. Get help right away if:  You have vision changes.  You have a severe or worsening headache or neck pain.  You cannot stop vomiting.  You have new numbness or weakness in any part of your body. Summary  Vertigo is the feeling that you or your surroundings are moving when they are not.  The Epley maneuver is an exercise that relieves symptoms of vertigo.  If the Epley maneuver is done correctly, it is considered safe. You can do it up to 3 times a day. This information is not intended to replace advice given to you by your health care provider. Make sure you discuss any questions you have with your health care provider. Document Released: 09/21/2013 Document Revised: 08/06/2016 Document Reviewed: 08/06/2016  Elsevier Interactive Patient Education  2017 [employer] Inc.  

## 2018-06-15 ENCOUNTER — Telehealth: Payer: Self-pay | Admitting: Oncology

## 2018-06-15 NOTE — Telephone Encounter (Signed)
GM PAL 9/26 - per GM moved f/u to [facility]. Left message. Schedule mailed.

## 2018-06-25 ENCOUNTER — Inpatient Hospital Stay: Payer: 59 | Attending: Oncology | Admitting: Adult Health

## 2018-06-25 ENCOUNTER — Telehealth: Payer: Self-pay

## 2018-06-25 ENCOUNTER — Encounter: Payer: Self-pay | Admitting: Adult Health

## 2018-06-25 ENCOUNTER — Inpatient Hospital Stay: Payer: 59

## 2018-06-25 ENCOUNTER — Telehealth: Payer: Self-pay | Admitting: Adult Health

## 2018-06-25 VITALS — BP 135/99 | HR 79 | Temp 98.5°F | Resp 18 | Ht 65.0 in | Wt 187.1 lb

## 2018-06-25 DIAGNOSIS — Z171 Estrogen receptor negative status [ER-]: Secondary | ICD-10-CM

## 2018-06-25 DIAGNOSIS — C50212 Malignant neoplasm of upper-inner quadrant of left female breast: Secondary | ICD-10-CM

## 2018-06-25 DIAGNOSIS — Z923 Personal history of irradiation: Secondary | ICD-10-CM | POA: Insufficient documentation

## 2018-06-25 DIAGNOSIS — J328 Other chronic sinusitis: Secondary | ICD-10-CM | POA: Insufficient documentation

## 2018-06-25 DIAGNOSIS — R51 Headache: Secondary | ICD-10-CM | POA: Diagnosis not present

## 2018-06-25 DIAGNOSIS — Z9221 Personal history of antineoplastic chemotherapy: Secondary | ICD-10-CM | POA: Insufficient documentation

## 2018-06-25 DIAGNOSIS — Z853 Personal history of malignant neoplasm of breast: Secondary | ICD-10-CM | POA: Insufficient documentation

## 2018-06-25 LAB — CBC WITH DIFFERENTIAL/PLATELET
BASOS ABS: 0 10*3/uL (ref 0.0–0.1)
BASOS PCT: 0 %
Eosinophils Absolute: 0.3 10*3/uL (ref 0.0–0.5)
Eosinophils Relative: 4 %
HEMATOCRIT: 40.6 % (ref 34.8–46.6)
HEMOGLOBIN: 13.7 g/dL (ref 11.6–15.9)
Lymphocytes Relative: 30 %
Lymphs Abs: 2.3 10*3/uL (ref 0.9–3.3)
MCH: 31.4 pg (ref 25.1–34.0)
MCHC: 33.7 g/dL (ref 31.5–36.0)
MCV: 92.9 fL (ref 79.5–101.0)
Monocytes Absolute: 0.8 10*3/uL (ref 0.1–0.9)
Monocytes Relative: 10 %
NEUTROS ABS: 4.3 10*3/uL (ref 1.5–6.5)
NEUTROS PCT: 56 %
Platelets: 317 10*3/uL (ref 145–400)
RBC: 4.37 MIL/uL (ref 3.70–5.45)
RDW: 12.7 % (ref 11.2–14.5)
WBC: 7.7 10*3/uL (ref 3.9–10.3)

## 2018-06-25 LAB — COMPREHENSIVE METABOLIC PANEL
ALT: 57 U/L — ABNORMAL HIGH (ref 0–44)
ANION GAP: 11 (ref 5–15)
AST: 32 U/L (ref 15–41)
Albumin: 4.5 g/dL (ref 3.5–5.0)
Alkaline Phosphatase: 72 U/L (ref 38–126)
BUN: 16 mg/dL (ref 6–20)
CALCIUM: 10.3 mg/dL (ref 8.9–10.3)
CHLORIDE: 103 mmol/L (ref 98–111)
CO2: 26 mmol/L (ref 22–32)
Creatinine, Ser: 0.74 mg/dL (ref 0.44–1.00)
GFR calc non Af Amer: >60 mL/min (ref >60)
Glucose, Bld: 90 mg/dL (ref 70–99)
Potassium: 3.9 mmol/L (ref 3.5–5.1)
SODIUM: 140 mmol/L (ref 135–145)
Total Bilirubin: 0.3 mg/dL (ref 0.3–1.2)
Total Protein: 7.9 g/dL (ref 6.5–8.1)

## 2018-06-25 NOTE — Telephone Encounter (Signed)
Gave pt avs and calendar with appts per 9/26 los.  °

## 2018-06-25 NOTE — Telephone Encounter (Signed)
Patient to get next diagnostic mammogram at the [facility].  [facility] and patient aware.  Due date 12/2018.

## 2018-06-25 NOTE — Progress Notes (Signed)
Alexandria Bradley  [phone] [phone]) [phone]     ID: Alexandria Bradley OB: 01-10-1961  MR#: [id number]  OHY#:[id number]  PCP: [name], DO GYN:  Brien Few MDSU: [name] OTHER MD: [name], Delrae Rend, Wilford Corner, Dorna Leitz  CHIEF COMPLAINT: Triple negative breast cancer  CURRENT TREATMENT: Observation  BREAST CANCER HISTORY: From the original intake note of 02/16/2014:  Alexandria Bradley palpated a mass in her left breast early May and brought it immediately to her gynecologist attention. He set her up for bilateral diagnostic mammography and left ultrasonography at [facility] 02/07/2014. Mammography showed a 1.3 cm oval mass with spiculated margins in the left breast at the 11:00 position. This was palpable. Ultrasound showed a 1.1 cm tall her than wide mass with a microlobulated margins. He was hypoechoic. There were no abnormalities noted in the left axilla.  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 Lyndon Code at conference 02/16/2014), triple negative, with an MIB-1 of 50%.  The patient's subsequent history is as detailed below  INTERVAL HISTORY: Alexandria Bradley returns today for follow-up of her triple negative breast cancer. She is under observation alone. She is doing moderately well.     REVIEW OF SYSTEMS: Alexandria Bradley is doing moderately well today.  She notes chest palpitations that have been coming on more frequently.  She noted it more this morning and her bp monitor showed a normal BP but the HR was irregular.  Alexandria Bradley notes a headache today.  She is following with Dr. Buddy Duty regarding her thyroid because her lab values have been off recently.  Alexandria Bradley has noted to her PCP regarding intermittent ear pain and it has been attributed to allergies.  Alexandria Bradley struggled with recurrent sinusitis last year as well.     Otherwise a detailed ROS was conducted and was non contributory.    PAST MEDICAL HISTORY: Past Medical History:  Diagnosis Date  . Anxiety   . Arthritis   . Back pain   . Breast cancer ([place])    left  . GERD (gastroesophageal reflux disease)   . Headache(784.0)    PMH : migraines  . Hypercholesterolemia   . Hypertension   . Hypothyroidism   . Kidney stones   . Osteoporosis   . PONV (postoperative nausea and vomiting)   . Psoriasis   . PVC's (premature ventricular contractions)   . Radiation 08/18/14-10/07/14   Left breast    PAST SURGICAL HISTORY: Past Surgical History:  Procedure Laterality Date  . BREAST LUMPECTOMY WITH AXILLARY LYMPH NODE BIOPSY  6/15   left  . CHOLECYSTECTOMY    . COLONOSCOPY N/A 06/27/2014   Procedure: COLONOSCOPY;  Surgeon: Lear Ng, MD;  [facility] ENDOSCOPY;  Service: Endoscopy;  Laterality: N/A;  . LIPOMA EXCISION    . MYOMECTOMY    . PORT-A-CATH REMOVAL N/A 02/03/2015   Procedure: REMOVAL PORT-A-CATH;  Surgeon: [name], MD;  Location: [place];  Service: General;  Laterality: N/A;  . PORTACATH PLACEMENT N/A 03/01/2014   Procedure: INSERTION PORT-A-CATH;  Surgeon: [name], MD;  Location: [place];  Service: General;  Laterality: N/A;  . TONSILLECTOMY    . TUBAL LIGATION      FAMILY HISTORY Family History  Problem Relation Age of Onset  . Endometrial cancer Mother 64  . Hearing loss Mother   . Atrial fibrillation Mother   . Lung cancer Father   .  Brain cancer Father   . Heart disease Maternal Aunt   . Lung cancer Maternal Uncle   . Stroke Maternal Grandmother   . Stroke Maternal Grandfather   . Bladder Cancer Maternal Uncle   . Multiple myeloma Maternal Uncle   . Other Son        trisomy 56   the patient's father died at the age of 70 from what may have been metastatic lung cancer. The patient knows very little about the father's side of her family. Her mother is alive at  age 66. She was recently diagnosed with endometrial cancer. The patient has one brother, 2 sisters. There is no history of breast or ovarian cancer in the family.  GYNECOLOGIC HISTORY:  Menarche age 32, first live birth age 24. The patient is GX P3. One child died shortly after birth. She stopped having periods approximately 2005. She did not use hormone replacement. 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 Her husband [name] is a Insurance underwriter. Son [name] is  Nurse, adult in [place]. Daughter [name] lives in [place] and works for [employer] as an Therapist, sports.     ADVANCED DIRECTIVES: Not in place   HEALTH MAINTENANCE: Social History   Tobacco Use  . Smoking status: Former Smoker    Packs/day: 0.50    Years: 15.00    Pack years: 7.50    Types: Cigarettes  . Smokeless tobacco: Never Used  . Tobacco comment: Quit smoking cigarettes in 2002  Substance Use Topics  . Alcohol use: Yes    Comment: social  . Drug use: No     Colonoscopy: 2010  PAP: 2013  Bone density: 05/15/2009, at [facility], normal, lowest T score -0.8  Lipid panel:  Allergies  Allergen Reactions  . Ciprofloxacin Anxiety and Rash  . Prednisone Shortness Of Breath and Other (See Comments)    "jacks me up" jittery  . Codeine Nausea Only and Other (See Comments)    Nausea   . Cortisone Swelling and Other (See Comments)    "jack me up"  . Chlorhexidine Itching    Current Outpatient Medications  Medication Sig Dispense Refill  . acetaminophen (TYLENOL) 500 MG tablet Take 1,000 mg by mouth every 6 (six) hours as needed for headache. Reported on 12/28/2015    . amLODipine (NORVASC) 5 MG tablet Take 5 mg by mouth daily.     . fluticasone (FLONASE) 50 MCG/ACT nasal spray Place 2 sprays into both nostrils daily. 16 g 2  . liothyronine (CYTOMEL) 5 MCG tablet Take 5 mcg by mouth daily.      Marland Kitchen loratadine-pseudoephedrine (CLARITIN-D 24-HOUR) 10-240 MG 24 hr tablet Take 1 tablet by mouth daily.    . metFORMIN (GLUCOPHAGE) 500 MG tablet Take 1,000 mg by mouth every evening.     Marland Kitchen TIROSINT 112 MCG CAPS TAKE ONE CAPSULE BY MOUTH ON AN EMPTY STOMACH IN THE MORNING  2  . triamterene-hydrochlorothiazide (MAXZIDE-25) 37.5-25 MG per tablet Take 1 tablet by mouth daily.      No current facility-administered medications for this visit.     OBJECTIVE:  Vitals:   06/25/18 1337  BP: (!) 135/99  Pulse: 79  Resp: 18  Temp: 98.5 F (36.9 C)  SpO2: 98%     Body mass index is 31.14 kg/m.    ECOG FS:1 - Symptomatic but completely ambulatory GENERAL: Patient is a well  appearing female in no acute distress HEENT:  Sclerae anicteric.  Oropharynx clear and moist. No ulcerations or evidence of oropharyngeal candidiasis. Neck is supple.  NODES:  No cervical, supraclavicular, or axillary lymphadenopathy palpated.  BREAST EXAM:  Left breast s/p lumpectomy, no sign of recurrence, right breast benign LUNGS:  Clear to auscultation bilaterally.  No wheezes or rhonchi. HEART:  Regular rate and rhythm. No murmur appreciated. ABDOMEN:  Soft, nontender.  Positive, normoactive bowel sounds. No organomegaly palpated. MSK:  No focal spinal tenderness to palpation. Full range of motion bilaterally in the upper extremities. EXTREMITIES:  No peripheral edema.   SKIN:  Clear with no obvious rashes or skin changes. No nail dyscrasia. NEURO:  Nonfocal. Well oriented.  Appropriate affect.    LAB RESULTS: No results found for: SPEP  Lab Results  Component Value Date   WBC 7.7 06/25/2018   NEUTROABS 4.3 06/25/2018   HGB 13.7 06/25/2018   HCT 40.6 06/25/2018   MCV 92.9 06/25/2018   PLT 317 06/25/2018      Chemistry      Component Value Date/Time   NA 140 06/25/2018 1300   NA 140 06/19/2017 1312   K 3.9 06/25/2018 1300   K 3.5 06/19/2017 1312   CL 103 06/25/2018 1300   CO2 26 06/25/2018 1300   CO2  25 06/19/2017 1312   BUN 16 06/25/2018 1300   BUN 18.4 06/19/2017 1312   CREATININE 0.74 06/25/2018 1300   CREATININE 0.8 06/19/2017 1312      Component Value Date/Time   CALCIUM 10.3 06/25/2018 1300   CALCIUM 10.2 06/19/2017 1312   ALKPHOS 72 06/25/2018 1300   ALKPHOS 86 06/19/2017 1312   AST 32 06/25/2018 1300   AST 15 06/19/2017 1312   ALT 57 (H) 06/25/2018 1300   ALT 22 06/19/2017 1312   BILITOT 0.3 06/25/2018 1300   BILITOT 0.26 06/19/2017 1312       No results found for: LABCA2  No components found for: LABCA125  No results for input(s): INR in the last 168 hours.  Urinalysis    Component Value Date/Time   COLORURINE YELLOW 03/13/2009 2251   APPEARANCEUR CLOUDY (A) 03/13/2009 2251   LABSPEC 1.020 06/15/2014 1101   PHURINE 6.0 06/15/2014 1101   PHURINE 5.5 03/13/2009 2251   GLUCOSEU Negative 06/15/2014 1101   HGBUR Negative 06/15/2014 1101   HGBUR NEGATIVE 03/13/2009 2251   BILIRUBINUR Color Interference 06/15/2014 1101   KETONESUR Negative 06/15/2014 1101   KETONESUR NEGATIVE 03/13/2009 2251   PROTEINUR 30 06/15/2014 1101   PROTEINUR NEGATIVE 03/13/2009 2251   UROBILINOGEN 0.2 06/15/2014 1101   NITRITE Negative 06/15/2014 1101   NITRITE NEGATIVE 03/13/2009 2251   LEUKOCYTESUR Trace 06/15/2014 1101    STUDIES:  ASSESSMENT: 57 y.o. [place] woman status post left breast upper inner quadrant biopsy 02/08/2014 for a clinical T1c N0, stage IA invasive ductal carcinoma, grade 3, triple negative, with an MIB-1 of 50%.  (1) status post left lumpectomy and sentinel lymph node sampling 03/01/2014 for a pT1c pN0, stage IA invasive ductal carcinoma, grade 3, with negative margins, repeat prognostic panel again triple negative.  (2) adjuvant chemotherapy started a 03/25/2014 consisting of cyclophosphamide and doxorubicin given in dose dense fashion x4, completed 05/06/2014; followed by weekly paclitaxel x1, on 05/27/2014, poorly tolerated;   (a) switched to Abraxane  starting 06/10/2014, with 11 Abraxane doses planned  (b) dose reduced by 20% because of neutropenia starting 07/01/2014 dose  (c) discontinued after 4th Abraxane dose 07/15/2014 due to neuropathy  (  3) adjuvant radiation completed 10/07/2014.  Left breast with breath hold technique/ 45 Gy at 1.8 Gy per fraction x 25 fractions.   Left breast boost/ 16 Gy at 2 Gy per fraction x 8 fractions  (4) genetics counseling 07/06/2015 through the Breast/Ovarian cancer gene panel offered by [facility] found no deleterious mutations in ATM, BARD1, BRCA1, BRCA2, BRIP1, CDH1, CHEK2, EPCAM, FANCC, MLH1, MSH2, MSH6, NBN, PALB2, PMS2, PTEN, RAD51C, RAD51D, TP53, and XRCC2.  (5) question of sleep apnea  (6) transverse colon lesion noted on CT scan from 06/17/2014 - negative biopsy, also showed fatty liver  (a) repeat CT scan of the abdomen and pelvis with contrast 02/15/2016 negative  PLAN: Alexandria Bradley is doing moderately well today.  She has had a lot of issues this year with recurrent sinusitis, has been on different antibiotics, and headaches.  I recommended that she get into see an ENT specialist and consider undergoing imaging to evaluate her sinuses to see if there is anything that should or could be done to help her have a better fall and winter season.  She notes that she plans to since fall has started back.    Should her headaches continue, change, or worsen, we would want to see her back to consider imaging, and she understands this. She does not want imaging at this time.   Alexandria Bradley has no sign of breast cancer recurrence.  She did undergo a mammogram in 12/2017 that was normal.  She was supposed to have had an ultrasound at that time as well.  Due to her experience at [facility], she does not want to return.  I have ordered her mammogram for 12/2018 to be done at the [facility].  My nurse has informed the patient and the breast center of this.    I recommended that Alexandria Bradley keep up with her health maintenance  including healthy diet, exercise, and to stay up to date with her cancer screenings for colon cancer, skin cancer, and gyn cancer.    Alexandria Bradley will return in 1 year for f/u with Dr. Jana Hakim. She knows to call for any problems that may develop before that visit.  A total of (30) minutes of face-to-face time was spent with this patient with greater than 50% of that time in counseling and care-coordination.   Wilber Bihari, NP  06/26/18 12:45 PM Medical Oncology and Hematology [facility] [address] [place], [place] [address] Tel. [phone]    Fax. [phone]

## 2018-07-10 ENCOUNTER — Ambulatory Visit (INDEPENDENT_AMBULATORY_CARE_PROVIDER_SITE_OTHER): Payer: 59 | Admitting: Family Medicine

## 2018-07-10 ENCOUNTER — Encounter: Payer: Self-pay | Admitting: Family Medicine

## 2018-07-10 VITALS — BP 128/82 | HR 81 | Temp 98.0°F | Ht 65.0 in | Wt 188.4 lb

## 2018-07-10 DIAGNOSIS — R42 Dizziness and giddiness: Secondary | ICD-10-CM | POA: Diagnosis not present

## 2018-07-10 DIAGNOSIS — R0609 Other forms of dyspnea: Secondary | ICD-10-CM

## 2018-07-10 DIAGNOSIS — R0602 Shortness of breath: Secondary | ICD-10-CM

## 2018-07-10 DIAGNOSIS — R079 Chest pain, unspecified: Secondary | ICD-10-CM | POA: Diagnosis not present

## 2018-07-10 LAB — COMPREHENSIVE METABOLIC PANEL
ALBUMIN: 4.7 g/dL (ref 3.5–5.2)
ALT: 49 U/L — ABNORMAL HIGH (ref 0–35)
AST: 28 U/L (ref 0–37)
Alkaline Phosphatase: 60 U/L (ref 39–117)
BUN: 18 mg/dL (ref 6–23)
CO2: 28 mEq/L (ref 19–32)
CREATININE: 0.74 mg/dL (ref 0.40–1.20)
Calcium: 10.3 mg/dL (ref 8.4–10.5)
Chloride: 101 mEq/L (ref 96–112)
GFR: 85.22 mL/min (ref >60.00)
GLUCOSE: 104 mg/dL — AB (ref 70–99)
Potassium: 3.7 mEq/L (ref 3.5–5.1)
SODIUM: 139 meq/L (ref 135–145)
Total Bilirubin: 0.5 mg/dL (ref 0.2–1.2)
Total Protein: 7.4 g/dL (ref 6.0–8.3)

## 2018-07-10 LAB — CBC
HCT: 40.7 % (ref 36.0–46.0)
Hemoglobin: 13.8 g/dL (ref 12.0–15.0)
MCHC: 33.8 g/dL (ref 30.0–36.0)
MCV: 92 fl (ref 78.0–100.0)
Platelets: 311 10*3/uL (ref 150.0–400.0)
RBC: 4.43 Mil/uL (ref 3.87–5.11)
RDW: 12.9 % (ref 11.5–15.5)
WBC: 6.3 10*3/uL (ref 4.0–10.5)

## 2018-07-10 LAB — TSH: TSH: 1.78 u[IU]/mL (ref 0.35–4.50)

## 2018-07-10 LAB — MAGNESIUM: Magnesium: 2.1 mg/dL (ref 1.5–2.5)

## 2018-07-10 LAB — T4, FREE: Free T4: 0.99 ng/dL (ref 0.60–1.60)

## 2018-07-10 NOTE — Progress Notes (Signed)
Chief Complaint  Patient presents with  . Shortness of Breath  . Chest Pain  . Back Pain    Alexandria Bradley is a 57 y.o. female here for palpitations.  Length of issue: 3 weeks Does have hx of PVCs.  They usually do coincide with her so she has symptoms. Light headedness/passing out? Did get dizzy Chest pain/shortness of breath? Yes- sob lasts for several hours on bad days; this is exertional She does have an appointment with her cardiologist in less than 2 weeks. Medication changes/illicit substances? No   I also saw her for vertigo in the past.  The Epley maneuver exercises helped a little bit but it would come back.  She still having ear pain.  She cannot use Flonase daily because it gives her a headache.  ROS: Cardiac: As noted in HPI Pulm: No current SOB  Past Medical History:  Diagnosis Date  . Anxiety   . Arthritis   . Back pain   . Breast cancer ([place])    left  . GERD (gastroesophageal reflux disease)   . Headache(784.0)    PMH : migraines  . Hypercholesterolemia   . Hypertension   . Hypothyroidism   . Kidney stones   . Osteoporosis   . PONV (postoperative nausea and vomiting)   . Psoriasis   . PVC's (premature ventricular contractions)   . Radiation 08/18/14-10/07/14   Left breast   Past Surgical History:  Procedure Laterality Date  . BREAST LUMPECTOMY WITH AXILLARY LYMPH NODE BIOPSY  6/15   left  . CHOLECYSTECTOMY    . COLONOSCOPY N/A 06/27/2014   Procedure: COLONOSCOPY;  Surgeon: Lear Ng, MD;  Location[facility] ENDOSCOPY;  Service: Endoscopy;  Laterality: N/A;  . LIPOMA EXCISION    . MYOMECTOMY    . PORT-A-CATH REMOVAL N/A 02/03/2015   Procedure: REMOVAL PORT-A-CATH;  Surgeon: [name], MD;  Location: [place];  Service: General;  Laterality: N/A;  . PORTACATH PLACEMENT N/A 03/01/2014   Procedure: INSERTION PORT-A-CATH;  Surgeon: [name], MD;  Location: [place];  Service: General;  Laterality:  N/A;  . TONSILLECTOMY    . TUBAL LIGATION     Allergies  Allergen Reactions  . Ciprofloxacin Anxiety and Rash  . Prednisone Shortness Of Breath and Other (See Comments)    "jacks me up" jittery  . Codeine Nausea Only and Other (See Comments)    Nausea   . Cortisone Swelling and Other (See Comments)    "jack me up"  . Chlorhexidine Itching     BP 128/82 (BP Location: Left Arm, Patient Position: Sitting, Cuff Size: Normal)   Pulse 81   Temp 98 F (36.7 C) (Oral)   Ht 5\' 5"  (1.651 m)   Wt 188 lb 6 oz (85.4 kg)   SpO2 96%   BMI 31.35 kg/m  Gen: Awake, alert, appearing stated age Eyes: PERRLA Mouth: MMM Heart: RRR, no bruits, no LE edema Lungs: CTAB, no accessory muscle use Neuro: No cerebellar signs MSK: No muscle group atrophy or asymmetry Psych: Age appropriate judgment and insight, mood/affect WNL  Exertional dyspnea - Plan: CBC, Comprehensive metabolic panel, TSH, T4, free, Magnesium  Chest pain, unspecified type - Plan: EKG 12-Lead  Shortness of breath - Plan: EKG 12-Lead  Vertigo - Plan: Ambulatory referral to Physical Therapy  EKG nml.  This gives me reassurance to wait for her cardiology appointment.  We will check a few labs. Warning signs and symptoms discussed. Tries vestibular rehab.  Change  to Rhinocort or Nasonex. F/u as originally scheduled. The patient voiced understanding and agreement to the plan.  Crosby Oyster Wendling 1:15 PM 07/10/18

## 2018-07-10 NOTE — Progress Notes (Signed)
Pre visit review using our clinic review tool, if applicable. No additional management support is needed unless otherwise documented below in the visit note. 

## 2018-07-10 NOTE — Patient Instructions (Addendum)
Go back on the propranolol.   Your EKG is normal. This is very reassuring.  If you do not hear anything about your referral in the next 1-2 weeks, call our office and ask for an update.  Change Flonase to Nasonex or Rhinocort. The latter 2 do not have alcohol and may not cause a headache.  Let us know if you need anything.

## 2018-07-21 NOTE — Progress Notes (Addendum)
Cardiology Office Note   Date:  07/23/2018   ID:  [name], DOB Nov 11, 1960, MRN [id number]  PCP:  [name], DO  Cardiologist:  Dr. Johnsie Cancel    Chief Complaint  Patient presents with  . Palpitations      History of Present Illness: Alexandria Bradley is a 57 y.o. female who presents for palpitations   She has been seen by Dr. Johnsie Cancel for chest pain, has been seen previously for palpitations and had negative monitor and normal stress echo test.   In 2015 f/u echo due to chemo evaluation. That she had for brest cancer.  EF was normal 60-65% aortic root 3.8 cm.   She was seen in ER 11/2016 for chest pain, CRF, HTN, DM, and elevated lipids.  She had palpitations tightness in her chest and dyspnea.  There no EKG changes and CXR NAD.  No arrhthymias on tele. The SSCP occurred after palpitations.    She was concerned because her mother and Aunt have a fib.   Dr. Johnsie Cancel ordered ETT with normal ECG and monitor with SR with rare PVCs, no significant arrhythmia.  The ETT was normal.  She walked for 7 min reaching 96% pred max HR without ischemia.   Today she complains of increased palpations in last 5-6 weeks.  Associated with SOB, chest and back pressure.  Comes and goes.  She had her thyroid medication Tirosint increased and has gained wt. Since that time.  The palpitations and chest pain do not awaken from sleep but Yesterday while driving developed chest pressure and SOB no palpitations and had to rest until she felt better.   All of this began 5-6 weeks ago.  No nausea, takes less activity to cause SOB.    She also has hx of dizziness- meclazine did not help   So she will begin PT to help.     Past Medical History:  Diagnosis Date  . Anxiety   . Arthritis   . Back pain   . Breast cancer ([place])    left  . GERD (gastroesophageal reflux disease)   . Headache(784.0)    PMH : migraines  . Hypercholesterolemia   . Hypertension   . Hypothyroidism   . Kidney stones    . Osteoporosis   . PONV (postoperative nausea and vomiting)   . Psoriasis   . PVC's (premature ventricular contractions)   . Radiation 08/18/14-10/07/14   Left breast    Past Surgical History:  Procedure Laterality Date  . BREAST LUMPECTOMY WITH AXILLARY LYMPH NODE BIOPSY  6/15   left  . CHOLECYSTECTOMY    . COLONOSCOPY N/A 06/27/2014   Procedure: COLONOSCOPY;  Surgeon: Lear Ng, MD;  Location: [facility] ENDOSCOPY;  Service: Endoscopy;  Laterality: N/A;  . LIPOMA EXCISION    . MYOMECTOMY    . PORT-A-CATH REMOVAL N/A 02/03/2015   Procedure: REMOVAL PORT-A-CATH;  Surgeon: [name], MD;  Location: [place];  Service: General;  Laterality: N/A;  . PORTACATH PLACEMENT N/A 03/01/2014   Procedure: INSERTION PORT-A-CATH;  Surgeon: [name], MD;  Location: [place];  Service: General;  Laterality: N/A;  . TONSILLECTOMY    . TUBAL LIGATION       Current Outpatient Medications  Medication Sig Dispense Refill  . acetaminophen (TYLENOL) 500 MG tablet Take 1,000 mg by mouth every 6 (six) hours as needed for headache. Reported on 12/28/2015    . amLODipine (NORVASC) 5 MG tablet Take 5 mg by mouth  daily.     . fluticasone (FLONASE) 50 MCG/ACT nasal spray Place 2 sprays into both nostrils daily. 16 g 2  . liothyronine (CYTOMEL) 5 MCG tablet Take 5 mcg by mouth daily.     Marland Kitchen loratadine-pseudoephedrine (CLARITIN-D 24-HOUR) 10-240 MG 24 hr tablet Take 1 tablet by mouth daily.    . metFORMIN (GLUCOPHAGE) 500 MG tablet Take 1,000 mg by mouth every evening.     . propranolol (INDERAL) 10 MG tablet Take 1 tablet by mouth as needed.    Marland Kitchen TIROSINT 100 MCG CAPS Take 1 capsule by mouth daily.  4  . TIROSINT 112 MCG CAPS TAKE ONE CAPSULE BY MOUTH ON AN EMPTY STOMACH IN THE MORNING  2  . triamterene-hydrochlorothiazide (MAXZIDE-25) 37.5-25 MG per tablet Take 1 tablet by mouth daily.      No current facility-administered medications for this visit.      Allergies:   Chlorhexidine; Ciprofloxacin; Prednisone; Codeine; and Cortisone    Social History:  The patient  reports that she has quit smoking. Her smoking use included cigarettes. She has a 7.50 pack-year smoking history. She has never used smokeless tobacco. She reports that she drinks alcohol. She reports that she does not use drugs.   Family History:  The patient's family history includes Atrial fibrillation in her mother; Bladder Cancer in her maternal uncle; Brain cancer in her father; Endometrial cancer (age of onset: 37) in her mother; Hearing loss in her mother; Heart disease in her maternal aunt; Lung cancer in her father and maternal uncle; Multiple myeloma in her maternal uncle; Other in her son; Stroke in her maternal grandfather and maternal grandmother.    ROS:  General:no colds or fevers, no weight changes Skin:no rashes or ulcers HEENT:no blurred vision, no congestion CV:see HPI PUL:see HPI GI:no diarrhea constipation or melena, no indigestion GU:no hematuria, no dysuria MS:no joint pain, no claudication Neuro:no syncope, no lightheadedness + dizziness Endo:+ diabetes, + thyroid disease  Wt Readings from Last 3 Encounters:  07/23/18 193 lb 6.4 oz (87.7 kg)  07/10/18 188 lb 6 oz (85.4 kg)  06/25/18 187 lb 1.6 oz (84.9 kg)     PHYSICAL EXAM: VS:  BP 130/84   Pulse 76   Ht '5\' 5"'$  (1.651 m)   Wt 193 lb 6.4 oz (87.7 kg)   SpO2 93%   BMI 32.18 kg/m  , BMI Body mass index is 32.18 kg/m. General:Pleasant affect, NAD Skin:Warm and dry, brisk capillary refill HEENT:normocephalic, sclera clear, mucus membranes moist Neck:supple, no JVD, no bruits  Heart:S1S2 RRR without murmur, gallup, rub or click Lungs:clear without rales, rhonchi, or wheezes DTO:IZTI, non tender, + BS, do not palpate liver spleen or masses Ext:no lower ext edema, 2+ pedal pulses, 2+ radial pulses Neuro:alert and oriented X 3, MAE, follows commands, + facial symmetry    EKG:  EKG is  ordered today. The ekg ordered today demonstrates NSR Q wave in V 2 no acute changes.    Recent Labs: 07/10/2018: ALT 49; BUN 18; Creatinine, Ser 0.74; Hemoglobin 13.8; Magnesium 2.1; Platelets 311.0; Potassium 3.7; Sodium 139; TSH 1.78    Lipid Panel    Component Value Date/Time   CHOL 243 (HH) 01/12/2008 0815   TRIG 198 (H) 01/12/2008 0815   TRIG 209 (HH) 09/22/2006 1056   HDL 38.4 (L) 01/12/2008 0815   CHOLHDL 6.3 CALC 01/12/2008 0815   VLDL 40 01/12/2008 0815   LDLDIRECT 175.2 01/12/2008 0815       Other studies Reviewed: Additional studies/ records  that were reviewed today include: Marland Kitchen GXT 01/24/17  Pt walked for 7:00 of a standard [name] protocol GXT. Peak HR of 155 which is 96% predicted maximal HR  There were no ST or T wave changes to suggest ischemia  Blood pressure demonstrated a normal response to exercise.  Negative GXT   Echo  02/18/14 Study Conclusions  - Left ventricle: Tissue doppler lateral s&' drop is 10.66cm/sec. The global strain is normal at -21.7% The cavity size was normal. Systolic function was normal. The estimated ejection fraction was in the range of 60% to 65%. Wall motion was normal; there were no regional wall motion abnormalities. There was an increased relative contribution of atrial contraction to ventricular filling. Doppler parameters are consistent with abnormal left ventricular relaxation (grade 1 diastolic dysfunction). - Aorta: Aortic root dimension: 38 mm (ED). - Ascending aorta: The ascending aorta was mildly dilated.  ASSESSMENT AND PLAN:  1.  Unstable angina with chest and back discomfort with driving. + DOE and increase of palpations. In diabetic female. Discussed cardiac CTA but with episode yesterday and wait time for cardiac CTA believe we should evaluate earlier.  Discussed with DOD Dr. Acie Fredrickson and he agrees added lopressor 25 mg BID, NTG prn SL  Cath for 07/14/18   The patient understands that risks included  but are not limited to stroke (1 in 1000), death (1 in 68), kidney failure [usually temporary] (1 in 500), bleeding (1 in 200), allergic reaction [possibly serious] (1 in 200).    2.  Palpitations if due to ischemia then cath will reveal and no further studies but if no CAD will have pt wear 2 week event monitor.   3.  HTN controlled continue meds  4.  DM-2 per PCP  5.  Hypothyroid.per PCP  6.  Also hx of breast cancer with chemo and radiation.   Current medicines are reviewed with the patient today.  The patient Has no concerns regarding medicines.  The following changes have been made:  See above Labs/ tests ordered today include:see above  Disposition:   FU:  see above  Signed, Cecilie Kicks, NP  07/23/2018 8:50 AM    [facility], [place], [name], [place] Phone: [phone]; Fax: [phone]

## 2018-07-21 NOTE — H&P (View-Only) (Signed)
Cardiology Office Note   Date:  07/23/2018   ID:  Alexandria Bradley, DOB 12-Nov-1960, MRN [id number]  PCP:  Alex[place]ria Pal, DO  Cardiologist:  Dr. Johnsie Bradley    Chief Complaint  Patient presents with  . Palpitations      History of Present Illness: Alexandria Bradley is a 57 y.o. female who presents for palpitations   She has been seen by Dr. Johnsie Bradley for chest pain, has been seen previously for palpitations and had negative monitor and normal stress echo test.   In 2015 f/u echo due to chemo evaluation. That she had for brest cancer.  EF was normal 60-65% aortic root 3.8 cm.   She was seen in ER 11/2016 for chest pain, CRF, HTN, DM, and elevated lipids.  She had palpitations tightness in her chest and dyspnea.  There no EKG changes and CXR NAD.  No arrhthymias on tele. The SSCP occurred after palpitations.    She was concerned because her mother and Aunt have a fib.   Dr. Johnsie Bradley ordered ETT with normal ECG and monitor with SR with rare PVCs, no significant arrhythmia.  The ETT was normal.  She walked for 7 min reaching 96% pred max HR without ischemia.   Today she complains of increased palpations in last 5-6 weeks.  Associated with SOB, chest and back pressure.  Comes and goes.  She had her thyroid medication Tirosint increased and has gained wt. Since that time.  The palpitations and chest pain do not awaken from sleep but Yesterday while driving developed chest pressure and SOB no palpitations and had to rest until she felt better.   All of this began 5-6 weeks ago.  No nausea, takes less activity to cause SOB.    She also has hx of dizziness- meclazine did not help   So she will begin PT to help.     Past Medical History:  Diagnosis Date  . Anxiety   . Arthritis   . Back pain   . Breast cancer ([place])    left  . GERD (gastroesophageal reflux disease)   . Headache(784.0)    PMH : migraines  . Hypercholesterolemia   . Hypertension   . Hypothyroidism   . Kidney stones    . Osteoporosis   . PONV (postoperative nausea and vomiting)   . Psoriasis   . PVC's (premature ventricular contractions)   . Radiation 08/18/14-10/07/14   Left breast    Past Surgical History:  Procedure Laterality Date  . BREAST LUMPECTOMY WITH AXILLARY LYMPH NODE BIOPSY  6/15   left  . CHOLECYSTECTOMY    . COLONOSCOPY N/A 06/27/2014   Procedure: COLONOSCOPY;  Surgeon: Lear Ng, MD;  Location: [facility] ENDOSCOPY;  Service: Endoscopy;  Laterality: N/A;  . LIPOMA EXCISION    . MYOMECTOMY    . PORT-A-CATH REMOVAL N/A 02/03/2015   Procedure: REMOVAL PORT-A-CATH;  Surgeon: [name], MD;  Location: [facility] [place];  Service: General;  Laterality: N/A;  . PORTACATH PLACEMENT N/A 03/01/2014   Procedure: INSERTION PORT-A-CATH;  Surgeon: [name], MD;  Location: [facility];  Service: General;  Laterality: N/A;  . TONSILLECTOMY    . TUBAL LIGATION       Current Outpatient Medications  Medication Sig Dispense Refill  . acetaminophen (TYLENOL) 500 MG tablet Take 1,000 mg by mouth every 6 (six) hours as needed for headache. Reported on 12/28/2015    . amLODipine (NORVASC) 5 MG tablet Take 5 mg by mouth  daily.     . fluticasone (FLONASE) 50 MCG/ACT nasal spray Place 2 sprays into both nostrils daily. 16 g 2  . liothyronine (CYTOMEL) 5 MCG tablet Take 5 mcg by mouth daily.     Marland Kitchen loratadine-pseudoephedrine (CLARITIN-D 24-HOUR) 10-240 MG 24 hr tablet Take 1 tablet by mouth daily.    . metFORMIN (GLUCOPHAGE) 500 MG tablet Take 1,000 mg by mouth every evening.     . propranolol (INDERAL) 10 MG tablet Take 1 tablet by mouth as needed.    Marland Kitchen TIROSINT 100 MCG CAPS Take 1 capsule by mouth daily.  4  . TIROSINT 112 MCG CAPS TAKE ONE CAPSULE BY MOUTH ON AN EMPTY STOMACH IN THE MORNING  2  . triamterene-hydrochlorothiazide (MAXZIDE-25) 37.5-25 MG per tablet Take 1 tablet by mouth daily.      No current facility-administered medications for this visit.      Allergies:   Chlorhexidine; Ciprofloxacin; Prednisone; Codeine; and Cortisone    Social History:  The patient  reports that she has quit smoking. Her smoking use included cigarettes. She has a 7.50 pack-year smoking history. She has never used smokeless tobacco. She reports that she drinks alcohol. She reports that she does not use drugs.   Family History:  The patient's family history includes Atrial fibrillation in her mother; Bladder Cancer in her maternal uncle; Brain cancer in her father; Endometrial cancer (age of onset: 64) in her mother; Hearing loss in her mother; Heart disease in her maternal aunt; Lung cancer in her father and maternal uncle; Multiple myeloma in her maternal uncle; Other in her son; Stroke in her maternal grandfather and maternal grandmother.    ROS:  General:no colds or fevers, no weight changes Skin:no rashes or ulcers HEENT:no blurred vision, no congestion CV:see HPI PUL:see HPI GI:no diarrhea constipation or melena, no indigestion GU:no hematuria, no dysuria MS:no joint pain, no claudication Neuro:no syncope, no lightheadedness + dizziness Endo:+ diabetes, + thyroid disease  Wt Readings from Last 3 Encounters:  07/23/18 193 lb 6.4 oz (87.7 kg)  07/10/18 188 lb 6 oz (85.4 kg)  06/25/18 187 lb 1.6 oz (84.9 kg)     PHYSICAL EXAM: VS:  BP 130/84   Pulse 76   Ht '5\' 5"'$  (1.651 m)   Wt 193 lb 6.4 oz (87.7 kg)   SpO2 93%   BMI 32.18 kg/m  , BMI Body mass index is 32.18 kg/m. General:Pleasant affect, NAD Skin:Warm and dry, brisk capillary refill HEENT:normocephalic, sclera clear, mucus membranes moist Neck:supple, no JVD, no bruits  Heart:S1S2 RRR without murmur, gallup, rub or click Lungs:clear without rales, rhonchi, or wheezes PQZ:RAQT, non tender, + BS, do not palpate liver spleen or masses Ext:no lower ext edema, 2+ pedal pulses, 2+ radial pulses Neuro:alert and oriented X 3, MAE, follows commands, + facial symmetry    EKG:  EKG is  ordered today. The ekg ordered today demonstrates NSR Q wave in V 2 no acute changes.    Recent Labs: 07/10/2018: ALT 49; BUN 18; Creatinine, Ser 0.74; Hemoglobin 13.8; Magnesium 2.1; Platelets 311.0; Potassium 3.7; Sodium 139; TSH 1.78    Lipid Panel    Component Value Date/Time   CHOL 243 (HH) 01/12/2008 0815   TRIG 198 (H) 01/12/2008 0815   TRIG 209 (HH) 09/22/2006 1056   HDL 38.4 (L) 01/12/2008 0815   CHOLHDL 6.3 CALC 01/12/2008 0815   VLDL 40 01/12/2008 0815   LDLDIRECT 175.2 01/12/2008 0815       Other studies Reviewed: Additional studies/ records  that were reviewed today include: Marland Kitchen GXT 01/24/17  Pt walked for 7:00 of a standard [name] protocol GXT. Peak HR of 155 which is 96% predicted maximal HR  There were no ST or T wave changes to suggest ischemia  Blood pressure demonstrated a normal response to exercise.  Negative GXT   Echo  02/18/14 Study Conclusions  - Left ventricle: Tissue doppler lateral s&' drop is 10.66cm/sec. The global strain is normal at -21.7% The cavity size was normal. Systolic function was normal. The estimated ejection fraction was in the range of 60% to 65%. Wall motion was normal; there were no regional wall motion abnormalities. There was an increased relative contribution of atrial contraction to ventricular filling. Doppler parameters are consistent with abnormal left ventricular relaxation (grade 1 diastolic dysfunction). - Aorta: Aortic root dimension: 38 mm (ED). - Ascending aorta: The ascending aorta was mildly dilated.  ASSESSMENT AND PLAN:  1.  Unstable angina with chest and back discomfort with driving. + DOE and increase of palpations. In diabetic female. Discussed cardiac CTA but with episode yesterday and wait time for cardiac CTA believe we should evaluate earlier.  Discussed with DOD Dr. Acie Fredrickson and he agrees added lopressor 25 mg BID, NTG prn SL  Cath for 07/14/18   The patient understands that risks included  but are not limited to stroke (1 in 1000), death (1 in 42), kidney failure [usually temporary] (1 in 500), bleeding (1 in 200), allergic reaction [possibly serious] (1 in 200).    2.  Palpitations if due to ischemia then cath will reveal and no further studies but if no CAD will have pt wear 2 week event monitor.   3.  HTN controlled continue meds  4.  DM-2 per PCP  5.  Hypothyroid.per PCP  6.  Also hx of breast cancer with chemo and radiation.   Current medicines are reviewed with the patient today.  The patient Has no concerns regarding medicines.  The following changes have been made:  See above Labs/ tests ordered today include:see above  Disposition:   FU:  see above  Signed, Cecilie Kicks, NP  07/23/2018 8:50 AM    [facility], [place], [place], [place] Phone: [phone]; Fax: [phone]

## 2018-07-23 ENCOUNTER — Encounter: Payer: Self-pay | Admitting: Cardiology

## 2018-07-23 ENCOUNTER — Ambulatory Visit (INDEPENDENT_AMBULATORY_CARE_PROVIDER_SITE_OTHER): Payer: 59 | Admitting: Cardiology

## 2018-07-23 VITALS — BP 130/84 | HR 76 | Ht 65.0 in | Wt 193.4 lb

## 2018-07-23 DIAGNOSIS — R079 Chest pain, unspecified: Secondary | ICD-10-CM

## 2018-07-23 DIAGNOSIS — E118 Type 2 diabetes mellitus with unspecified complications: Secondary | ICD-10-CM | POA: Diagnosis not present

## 2018-07-23 DIAGNOSIS — I10 Essential (primary) hypertension: Secondary | ICD-10-CM | POA: Diagnosis not present

## 2018-07-23 DIAGNOSIS — R002 Palpitations: Secondary | ICD-10-CM | POA: Diagnosis not present

## 2018-07-23 NOTE — Patient Instructions (Addendum)
Medication Instructions:  Your physician recommends that you continue on your current medications as directed. Please refer to the Current Medication list given to you today.  If you need a refill on your cardiac medications before your next appointment, please call your pharmacy.   Lab work: None ordered  If you have labs (blood work) drawn today and your tests are completely normal, you will receive your results only by: Marland Kitchen MyChart Message (if you have MyChart) OR . A paper copy in the mail If you have any lab test that is abnormal or we need to change your treatment, we will call you to review the results.  Testing/Procedures: Your physician has recommended that you wear an event monitor for 2 weeks.  Event monitors are medical devices that record the heart's electrical activity. Doctors most often [place] these monitors to diagnose arrhythmias. Arrhythmias are problems with the speed or rhythm of the heartbeat. The monitor is a small, portable device. You can wear one while you do your normal daily activities. This is usually used to diagnose what is causing palpitations/syncope (passing out).   Your physician has requested that you have a cardiac catheterization. Cardiac catheterization is used to diagnose and/or treat various heart conditions. Doctors may recommend this procedure for a number of different reasons. The most common reason is to evaluate chest pain. Chest pain can be a symptom of coronary artery disease (CAD), and cardiac catheterization can show whether plaque is narrowing or blocking your heart's arteries. This procedure is also used to evaluate the valves, as well as measure the blood flow and oxygen levels in different parts of your heart. For further information please visit [employer]. Please follow instructions at the bottom of this page.    Follow-Up: Your physician recommends that you schedule a follow-up appointment in: 08/06/18 ARRIVE AT 10:45 TO SEE Cecilie Kicks, NP  Any Other Special Instructions Will Be Listed Below (If Applicable).   Cardiac Event Monitoring A cardiac event monitor is a small recording device that is used to detect abnormal heart rhythms (arrhythmias). The monitor is used to record your heart rhythm when you have symptoms, such as:  Fast heartbeats (palpitations), such as heart racing or fluttering.  Dizziness.  Fainting or light-headedness.  Unexplained weakness.  Some monitors are wired to electrodes placed on your chest. Electrodes are flat, sticky disks that attach to your skin. Other monitors may be hand-held or worn on the wrist. The monitor can be worn for up to 30 days. If the monitor is attached to your chest, a technician will prepare your chest for the electrode placement and show you how to work the monitor. Take time to practice using the monitor before you leave the office. Make sure you understand how to send the information from the monitor to your health care provider. In some cases, you may need to use a landline telephone instead of a cell phone. What are the risks? Generally, this device is safe to use, but it possible that the skin under the electrodes will become irritated. How to use your cardiac event monitor  Wear your monitor at all times, except when you are in water: ? Do not let the monitor get wet. ? Take the monitor off when you bathe. Do not swim or use a hot tub with it on.  Keep your skin clean. Do not put body lotion or moisturizer on your chest.  Change the electrodes as told by your health care provider or any time they  stop sticking to your skin. You may need to use medical tape to keep them on.  Try to put the electrodes in slightly different places on your chest to help prevent skin irritation. They must remain in the area under your left breast and in the upper right section of your chest.  Make sure the monitor is safely clipped to your clothing or in a location close to  your body that your health care provider recommends.  Press the button to record as soon as you feel heart-related symptoms, such as: ? Dizziness. ? Weakness. ? Light-headedness. ? Palpitations. ? Thumping or pounding in your chest. ? Shortness of breath. ? Unexplained weakness.  Keep a diary of your activities, such as walking, doing chores, and taking medicine. It is very important to note what you were doing when you pushed the button to record your symptoms. This will help your health care provider determine what might be contributing to your symptoms.  Send the recorded information as recommended by your health care provider. It may take some time for your health care provider to process the results.  Change the batteries as told by your health care provider.  Keep electronic devices away from your monitor. This includes: ? Tablets. ? MP3 players. ? Cell phones.  While wearing your monitor you should avoid: ? Electric blankets. ? Armed forces operational officer. ? Electric toothbrushes. ? Microwave ovens. ? Magnets. ? Metal detectors. Get help right away if:  You have chest pain.  You have extreme difficulty breathing or shortness of breath.  You develop a very fast heartbeat that persists.  You develop dizziness that does not go away.  You faint or constantly feel like you are about to faint. Summary  A cardiac event monitor is a small recording device that is used to help detect abnormal heart rhythms (arrhythmias).  The monitor is used to record your heart rhythm when you have heart-related symptoms.  Make sure you understand how to send the information from the monitor to your health care provider.  It is important to press the button on the monitor when you have any heart-related symptoms.  Keep a diary of your activities, such as walking, doing chores, and taking medicine. It is very important to note what you were doing when you pushed the button to record your symptoms.  This will help your health care provider learn what might be causing your symptoms. This information is not intended to replace advice given to you by your health care provider. Make sure you discuss any questions you have with your health care provider. Document Released: 06/25/2008 Document Revised: 08/31/2016 Document Reviewed: 08/31/2016 Elsevier Interactive Patient Education  2017 [place]:     [facility] OFFICE [place], [place] [address] Dept: [phone] Loc: [phone]  Alexandria Bradley  07/23/2018  You are scheduled for a Cardiac Catheterization on Friday, October 25 with Dr. Shelva Majestic.  1. Please arrive at the [facility] (Main Entrance A) at [facility]: [address] [place], [place] [address] at 5:30 AM (This time is two hours before your procedure to ensure your preparation). Free valet parking service is available.   Special note: Every effort is made to have your procedure done on time. Please understand that emergencies sometimes delay scheduled procedures.  2. Diet: Do not eat solid foods after midnight.  The patient may have clear liquids until  5am upon the day of the procedure.   Contrast Allergy: No  Stop taking, MAXIDE on Friday, October 25. Stop Taking PO Diabetes Meds Glucophage (Metformin)on Friday, October 25.  DO NOT RESTART UNTIL 48 HOURS AFTER THE CATH   On the morning of your procedure, take your Aspirin and any morning medicines NOT listed above.  You may use sips of water.  5. Plan for one night stay--bring personal belongings. 6. Bring a current list of your medications and current insurance cards. 7. You MUST have a responsible person to drive you home. 8. Someone MUST be with you the first 24 hours after you arrive home or your discharge will be delayed. 9. Please wear clothes that are easy to get on and  off and wear slip-on shoes.  Thank you for allowing [place] to care for you!   -- [facility]

## 2018-07-24 ENCOUNTER — Encounter (HOSPITAL_COMMUNITY)
Admission: RE | Disposition: A | Discharge status: Home / Self Care | Payer: Self-pay | Source: Ambulatory Visit | Attending: Cardiovascular Disease

## 2018-07-24 ENCOUNTER — Ambulatory Visit (HOSPITAL_COMMUNITY)
Admission: RE | Admit: 2018-07-24 | Discharge: 2018-07-24 | Disposition: A | Discharge status: Home / Self Care | Payer: 59 | Source: Ambulatory Visit | Attending: Cardiovascular Disease | Admitting: Cardiovascular Disease

## 2018-07-24 ENCOUNTER — Telehealth: Payer: Self-pay | Admitting: Cardiology

## 2018-07-24 DIAGNOSIS — F419 Anxiety disorder, unspecified: Secondary | ICD-10-CM | POA: Insufficient documentation

## 2018-07-24 DIAGNOSIS — R0789 Other chest pain: Secondary | ICD-10-CM | POA: Diagnosis not present

## 2018-07-24 DIAGNOSIS — E7800 Pure hypercholesterolemia, unspecified: Secondary | ICD-10-CM | POA: Insufficient documentation

## 2018-07-24 DIAGNOSIS — K219 Gastro-esophageal reflux disease without esophagitis: Secondary | ICD-10-CM | POA: Diagnosis not present

## 2018-07-24 DIAGNOSIS — E039 Hypothyroidism, unspecified: Secondary | ICD-10-CM | POA: Diagnosis not present

## 2018-07-24 DIAGNOSIS — Z7951 Long term (current) use of inhaled steroids: Secondary | ICD-10-CM | POA: Insufficient documentation

## 2018-07-24 DIAGNOSIS — M810 Age-related osteoporosis without current pathological fracture: Secondary | ICD-10-CM | POA: Diagnosis not present

## 2018-07-24 DIAGNOSIS — I129 Hypertensive chronic kidney disease with stage 1 through stage 4 chronic kidney disease, or unspecified chronic kidney disease: Secondary | ICD-10-CM | POA: Insufficient documentation

## 2018-07-24 DIAGNOSIS — E1122 Type 2 diabetes mellitus with diabetic chronic kidney disease: Secondary | ICD-10-CM | POA: Diagnosis not present

## 2018-07-24 DIAGNOSIS — M1990 Unspecified osteoarthritis, unspecified site: Secondary | ICD-10-CM | POA: Diagnosis not present

## 2018-07-24 DIAGNOSIS — Z9221 Personal history of antineoplastic chemotherapy: Secondary | ICD-10-CM | POA: Insufficient documentation

## 2018-07-24 DIAGNOSIS — G43909 Migraine, unspecified, not intractable, without status migrainosus: Secondary | ICD-10-CM | POA: Diagnosis not present

## 2018-07-24 DIAGNOSIS — Z7984 Long term (current) use of oral hypoglycemic drugs: Secondary | ICD-10-CM | POA: Diagnosis not present

## 2018-07-24 DIAGNOSIS — R079 Chest pain, unspecified: Secondary | ICD-10-CM

## 2018-07-24 DIAGNOSIS — Z923 Personal history of irradiation: Secondary | ICD-10-CM | POA: Diagnosis not present

## 2018-07-24 DIAGNOSIS — Z8249 Family history of ischemic heart disease and other diseases of the circulatory system: Secondary | ICD-10-CM | POA: Insufficient documentation

## 2018-07-24 DIAGNOSIS — Z885 Allergy status to narcotic agent status: Secondary | ICD-10-CM | POA: Insufficient documentation

## 2018-07-24 DIAGNOSIS — R002 Palpitations: Secondary | ICD-10-CM | POA: Diagnosis present

## 2018-07-24 DIAGNOSIS — N189 Chronic kidney disease, unspecified: Secondary | ICD-10-CM | POA: Diagnosis not present

## 2018-07-24 DIAGNOSIS — Z87891 Personal history of nicotine dependence: Secondary | ICD-10-CM | POA: Diagnosis not present

## 2018-07-24 DIAGNOSIS — E118 Type 2 diabetes mellitus with unspecified complications: Secondary | ICD-10-CM

## 2018-07-24 DIAGNOSIS — I200 Unstable angina: Secondary | ICD-10-CM | POA: Diagnosis not present

## 2018-07-24 DIAGNOSIS — Z853 Personal history of malignant neoplasm of breast: Secondary | ICD-10-CM | POA: Insufficient documentation

## 2018-07-24 HISTORY — PX: LEFT HEART CATH AND CORONARY ANGIOGRAPHY: CATH118249

## 2018-07-24 LAB — GLUCOSE, CAPILLARY: GLUCOSE-CAPILLARY: 94 mg/dL (ref 70–99)

## 2018-07-24 SURGERY — LEFT HEART CATH AND CORONARY ANGIOGRAPHY
Anesthesia: LOCAL

## 2018-07-24 MED ORDER — MIDAZOLAM HCL 2 MG/2ML IJ SOLN
INTRAMUSCULAR | Status: AC
  Filled 2018-07-24: qty 2

## 2018-07-24 MED ORDER — SODIUM CHLORIDE 0.9 % WEIGHT BASED INFUSION
3.0000 mL/kg/h | INTRAVENOUS | Status: AC
  Administered 2018-07-24: 3 mL/kg/h via INTRAVENOUS

## 2018-07-24 MED ORDER — HEPARIN (PORCINE) IN NACL 1000-0.9 UT/500ML-% IV SOLN
INTRAVENOUS | Status: AC
  Filled 2018-07-24: qty 500

## 2018-07-24 MED ORDER — DIAZEPAM 5 MG PO TABS: 5.0000 mg | ORAL_TABLET | Freq: Four times a day (QID) | ORAL | Status: DC | PRN

## 2018-07-24 MED ORDER — SODIUM CHLORIDE 0.9% FLUSH: 3.0000 mL | Freq: Two times a day (BID) | INTRAVENOUS | Status: DC

## 2018-07-24 MED ORDER — ONDANSETRON HCL 4 MG/2ML IJ SOLN: 4.0000 mg | Freq: Four times a day (QID) | INTRAMUSCULAR | Status: DC | PRN

## 2018-07-24 MED ORDER — LIDOCAINE HCL (PF) 1 % IJ SOLN
INTRAMUSCULAR | Status: DC | PRN
  Administered 2018-07-24: 3 mL via INTRADERMAL

## 2018-07-24 MED ORDER — SODIUM CHLORIDE 0.9% FLUSH: 3.0000 mL | INTRAVENOUS | Status: DC | PRN

## 2018-07-24 MED ORDER — HEPARIN SODIUM (PORCINE) 1000 UNIT/ML IJ SOLN
INTRAMUSCULAR | Status: DC | PRN
  Administered 2018-07-24: 4500 [IU] via INTRAVENOUS

## 2018-07-24 MED ORDER — ACETAMINOPHEN 325 MG PO TABS: 650.0000 mg | ORAL_TABLET | ORAL | Status: DC | PRN

## 2018-07-24 MED ORDER — LIDOCAINE HCL (PF) 1 % IJ SOLN
INTRAMUSCULAR | Status: AC
  Filled 2018-07-24: qty 30

## 2018-07-24 MED ORDER — MIDAZOLAM HCL 2 MG/2ML IJ SOLN
INTRAMUSCULAR | Status: DC | PRN
  Administered 2018-07-24: 1 mg via INTRAVENOUS
  Administered 2018-07-24: 2 mg via INTRAVENOUS

## 2018-07-24 MED ORDER — SODIUM CHLORIDE 0.9 % IV SOLN: 250.0000 mL | INTRAVENOUS | Status: DC | PRN

## 2018-07-24 MED ORDER — FENTANYL CITRATE (PF) 100 MCG/2ML IJ SOLN
INTRAMUSCULAR | Status: AC
  Filled 2018-07-24: qty 2

## 2018-07-24 MED ORDER — VERAPAMIL HCL 2.5 MG/ML IV SOLN
INTRAVENOUS | Status: AC
  Filled 2018-07-24: qty 2

## 2018-07-24 MED ORDER — FENTANYL CITRATE (PF) 100 MCG/2ML IJ SOLN
INTRAMUSCULAR | Status: DC | PRN
  Administered 2018-07-24 (×2): 25 ug via INTRAVENOUS

## 2018-07-24 MED ORDER — ASPIRIN 81 MG PO CHEW: 81.0000 mg | CHEWABLE_TABLET | ORAL | Status: DC

## 2018-07-24 MED ORDER — VERAPAMIL HCL 2.5 MG/ML IV SOLN
INTRAVENOUS | Status: DC | PRN
  Administered 2018-07-24 (×2): 10 mL via INTRA_ARTERIAL

## 2018-07-24 MED ORDER — HEPARIN (PORCINE) IN NACL 1000-0.9 UT/500ML-% IV SOLN
INTRAVENOUS | Status: DC | PRN
  Administered 2018-07-24 (×2): 500 mL

## 2018-07-24 MED ORDER — SODIUM CHLORIDE 0.9 % WEIGHT BASED INFUSION: 1.0000 mL/kg/h | INTRAVENOUS | Status: DC

## 2018-07-24 MED ORDER — SODIUM CHLORIDE 0.9 % IV SOLN: INTRAVENOUS | Status: DC

## 2018-07-24 MED ORDER — IOHEXOL 350 MG/ML SOLN
INTRAVENOUS | Status: DC | PRN
  Administered 2018-07-24: 125 mL via INTRA_ARTERIAL

## 2018-07-24 SURGICAL SUPPLY — 16 items
CATH INFINITI 5 FR JL3.5 (CATHETERS) ×1 IMPLANT
CATH INFINITI 5FR ANG PIGTAIL (CATHETERS) ×1 IMPLANT
CATH INFINITI JR4 5F (CATHETERS) ×1 IMPLANT
CATH LAUNCHER 5F EBU3.5 (CATHETERS) ×1 IMPLANT
CATH LAUNCHER 5F RADL (CATHETERS) IMPLANT
CATH OPTITORQUE TIG 4.0 5F (CATHETERS) ×1 IMPLANT
CATHETER LAUNCHER 5F RADL (CATHETERS) ×2
DEVICE RAD TR BAND REGULAR (VASCULAR PRODUCTS) ×1 IMPLANT
GLIDESHEATH SLEND SS 6F .021 (SHEATH) ×1 IMPLANT
GUIDEWIRE INQWIRE 1.5J.035X260 (WIRE) IMPLANT
INQWIRE 1.5J .035X260CM (WIRE) ×2
KIT HEART LEFT (KITS) ×2 IMPLANT
PACK CARDIAC CATHETERIZATION (CUSTOM PROCEDURE TRAY) ×2 IMPLANT
SYR MEDRAD MARK V 150ML (SYRINGE) ×2 IMPLANT
TRANSDUCER W/STOPCOCK (MISCELLANEOUS) ×2 IMPLANT
TUBING CIL FLEX 10 FLL-RA (TUBING) ×2 IMPLANT

## 2018-07-24 NOTE — Telephone Encounter (Signed)
Pt called to report that she had a heart cath today and was told when being discharged to have an Echocardiogram.. No D/C summary yet.. Will forward message to Dr. Claiborne Billings (performed the cath) and Cecilie Kicks NP for the order.

## 2018-07-24 NOTE — Telephone Encounter (Signed)
Pt advised Echo has been ordered and scheduling to call and make appt for her.. Will keep appt for her monitor 07/28/18.

## 2018-07-24 NOTE — Discharge Instructions (Signed)

## 2018-07-24 NOTE — Telephone Encounter (Signed)
New Message   Pt states that she was told, she needed an echo but no order in epic. Also wants to know if the echo needs to be done prior to getting a holter monitor Please call

## 2018-07-24 NOTE — Telephone Encounter (Signed)
Yes echo for LV function with chest pain.  No discharge summary for outpt cath alone.  She will still need event monitor.   Thanks.

## 2018-07-24 NOTE — Interval H&P Note (Signed)
Cath Lab Visit (complete for each Cath Lab visit)  Clinical Evaluation Leading to the Procedure:   ACS: No.  Non-ACS:    Anginal Classification: CCS III  Anti-ischemic medical therapy: Maximal Therapy (2 or more classes of medications)  Non-Invasive Test Results: Low-risk stress test findings: cardiac mortality <1%/year  Prior CABG: No previous CABG      History and Physical Interval Note:  07/24/2018 7:31 AM  Alexandria Bradley  has presented today for surgery, with the diagnosis of ua  The various methods of treatment have been discussed with the patient and family. After consideration of risks, benefits and other options for treatment, the patient has consented to  Procedure(s): LEFT HEART CATH AND CORONARY ANGIOGRAPHY (N/A) as a surgical intervention .  The patient's history has been reviewed, patient examined, no change in status, stable for surgery.  I have reviewed the patient's chart and labs.  Questions were answered to the patient's satisfaction.     Shelva Majestic

## 2018-07-27 ENCOUNTER — Encounter (HOSPITAL_COMMUNITY): Payer: Self-pay | Admitting: Cardiovascular Disease

## 2018-07-27 NOTE — Telephone Encounter (Signed)
ok 

## 2018-07-28 ENCOUNTER — Ambulatory Visit (INDEPENDENT_AMBULATORY_CARE_PROVIDER_SITE_OTHER): Payer: 59

## 2018-07-28 ENCOUNTER — Other Ambulatory Visit: Payer: Self-pay

## 2018-07-28 ENCOUNTER — Ambulatory Visit (HOSPITAL_COMMUNITY): Payer: 59 | Attending: Cardiovascular Disease

## 2018-07-28 DIAGNOSIS — E118 Type 2 diabetes mellitus with unspecified complications: Secondary | ICD-10-CM | POA: Diagnosis not present

## 2018-07-28 DIAGNOSIS — R079 Chest pain, unspecified: Secondary | ICD-10-CM

## 2018-07-28 DIAGNOSIS — I10 Essential (primary) hypertension: Secondary | ICD-10-CM

## 2018-07-28 DIAGNOSIS — R002 Palpitations: Secondary | ICD-10-CM

## 2018-07-29 ENCOUNTER — Telehealth: Payer: Self-pay | Admitting: *Deleted

## 2018-07-29 NOTE — Telephone Encounter (Signed)
Returning call.

## 2018-07-29 NOTE — Telephone Encounter (Signed)
Called pt re: echo results.  Left a message for pt to call back.

## 2018-07-29 NOTE — Telephone Encounter (Signed)
-----   Message from Isaiah Serge, NP sent at 07/29/2018  9:23 AM EDT ----- Pt had cath due to chest pain. Increased palpitations. Cath with patent arteries ,   Echo LV normal.

## 2018-07-29 NOTE — Telephone Encounter (Signed)
Returned pts call and she has been made aware of her echo results.  

## 2018-08-06 ENCOUNTER — Ambulatory Visit (INDEPENDENT_AMBULATORY_CARE_PROVIDER_SITE_OTHER): Payer: 59 | Admitting: Cardiology

## 2018-08-06 ENCOUNTER — Encounter: Payer: Self-pay | Admitting: Cardiology

## 2018-08-06 VITALS — BP 118/88 | HR 73 | Ht 65.0 in | Wt 192.0 lb

## 2018-08-06 DIAGNOSIS — R002 Palpitations: Secondary | ICD-10-CM | POA: Diagnosis not present

## 2018-08-06 DIAGNOSIS — I10 Essential (primary) hypertension: Secondary | ICD-10-CM

## 2018-08-06 DIAGNOSIS — R0789 Other chest pain: Secondary | ICD-10-CM

## 2018-08-06 DIAGNOSIS — E118 Type 2 diabetes mellitus with unspecified complications: Secondary | ICD-10-CM

## 2018-08-06 MED ORDER — METOPROLOL SUCCINATE ER 25 MG PO TB24: 25.0000 mg | ORAL_TABLET | Freq: Every day | ORAL | 3 refills | Status: DC

## 2018-08-06 NOTE — Patient Instructions (Signed)
Medication Instructions:  Your physician has recommended you make the following change in your medication:  1. STOP AMLODIPINE  2. START TOPROL 25 MG DAILY  3. CONTINUE TAKING PROPRANOLOL AS NEEDED   If you need a refill on your cardiac medications before your next appointment, please call your pharmacy.   Lab work: NONE If you have labs (blood work) drawn today and your tests are completely normal, you will receive your results only by: Marland Kitchen MyChart Message (if you have MyChart) OR . A paper copy in the mail If you have any lab test that is abnormal or we need to change your treatment, we will call you to review the results.  Testing/Procedures: NONE  Follow-Up: At [facility], you and your health needs are our priority.  As part of our continuing mission to provide you with exceptional heart care, we have created designated Provider Care Teams.  These Care Teams include your primary Cardiologist (physician) and Advanced Practice Providers (APPs -  Physician Assistants and Nurse Practitioners) who all work together to provide you with the care you need, when you need it. You will need a follow up appointment in 2-3 weeks.  Please call our office 2 months in advance to schedule this appointment.  You may see Jenkins Rouge, MD or one of the following Advanced Practice Providers on your designated Care Team WHEN DR. Johnsie Cancel IS IN THE OFFICE Truitt Merle, NP Cecilie Kicks, NP . Kathyrn Drown, NP  Any Other Special Instructions Will Be Listed Below (If Applicable).

## 2018-08-06 NOTE — Progress Notes (Signed)
Cardiology Office Note   Date:  08/06/2018   ID:  Alexandria Bradley, DOB 1961/03/21, MRN [id number]  PCP:  [name], DO  Cardiologist:  Dr. Johnsie Cancel    Chief Complaint  Patient presents with  . Hospitalization Follow-up      History of Present Illness: Alexandria Bradley is a 57 y.o. female who presents for post cath follow up.    She has been seen by Dr. Johnsie Cancel for chest pain, has been seen previously for palpitations and had negative monitor and normal stress echo test. In 2015 f/u echo due to chemo evaluation. That she had for brest cancer. EF was normal 60-65% aortic root 3.8 cm. She was seen in ER 11/2016 for chest pain, CRF, HTN, DM, and elevated lipids. She had palpitations tightness in her chest and dyspnea. There no EKG changes and CXR NAD. No arrhthymias on tele. The SSCP occurred after palpitations. She was concerned because her mother and Aunt have a fib.  Dr. Johnsie Cancel ordered ETT with normal ECG and monitor with SR with rare PVCs, no significant arrhythmia. The ETT was normal. She walked for 7 min reaching 96% pred max HR without ischemia.   On last visit 10/19 she had increased palpitations and SOB and chest pain. These did occur at rest while driving.  We arranged cardiac cath and she had normal coronary arteries, with a dominant RCA system.  LVEDP was 10 mmHg.  She has been wearing event monitor, results not yet back.  Echo with EF 37-85%, Y8FO, RV systolic function was mildly to moderately reduced.    Her palpitations improved for short periods of time.   Now has been having more, but has not taken the inderal. PRN.  She is anxious she has a fib, like her mother,  Have tried to reassure.  She is wearing monitor for 2 weeks.      Since the symptoms are bothersome, will stop amlodipine and add toprol XL. She continues with chest pain reassured not cardiac, she has had hx of ulcers but does not believe it to be GI.     Past Medical History:    Diagnosis Date  . Anxiety   . Arthritis   . Back pain   . Breast cancer ([place])    left  . GERD (gastroesophageal reflux disease)   . Headache(784.0)    PMH : migraines  . Hypercholesterolemia   . Hypertension   . Hypothyroidism   . Kidney stones   . Osteoporosis   . PONV (postoperative nausea and vomiting)   . Psoriasis   . PVC's (premature ventricular contractions)   . Radiation 08/18/14-10/07/14   Left breast    Past Surgical History:  Procedure Laterality Date  . BREAST LUMPECTOMY WITH AXILLARY LYMPH NODE BIOPSY  6/15   left  . CHOLECYSTECTOMY    . COLONOSCOPY N/A 06/27/2014   Procedure: COLONOSCOPY;  Surgeon: Lear Ng, MD;  Location: [facility] ENDOSCOPY;  Service: Endoscopy;  Laterality: N/A;  . LEFT HEART CATH AND CORONARY ANGIOGRAPHY N/A 07/24/2018   Procedure: LEFT HEART CATH AND CORONARY ANGIOGRAPHY;  Surgeon: Troy Sine, MD;  Location: [facility] CV LAB;  Service: Cardiovascular;  Laterality: N/A;  . LIPOMA EXCISION    . MYOMECTOMY    . PORT-A-CATH REMOVAL N/A 02/03/2015   Procedure: REMOVAL PORT-A-CATH;  Surgeon: [name], MD;  Location: [facility] [place];  Service: General;  Laterality: N/A;  . PORTACATH PLACEMENT N/A 03/01/2014   Procedure: INSERTION PORT-A-CATH;  Surgeon: [name], MD;  Location: [name];  Service: General;  Laterality: N/A;  . TONSILLECTOMY    . TUBAL LIGATION       Current Outpatient Medications  Medication Sig Dispense Refill  . acetaminophen (TYLENOL) 500 MG tablet Take 1,000 mg by mouth at bedtime.     . betamethasone dipropionate (DIPROLENE) 0.05 % cream Apply 1 application topically daily as needed (psoriasis).    . fluticasone (FLONASE) 50 MCG/ACT nasal spray Place 2 sprays into both nostrils daily. (Patient taking differently: Place 1 spray into both nostrils daily. ) 16 g 2  . liothyronine (CYTOMEL) 5 MCG tablet Take 5 mcg by mouth daily before breakfast.     . loratadine (CLARITIN)  10 MG tablet Take 10 mg by mouth daily.    . metFORMIN (GLUCOPHAGE) 500 MG tablet Take 1,000 mg by mouth daily after supper.     . propranolol (INDERAL) 10 MG tablet Take 10 mg by mouth daily as needed (heart palpitations).     . TIROSINT 100 MCG CAPS Take 100 mcg by mouth daily before breakfast.   4  . triamterene-hydrochlorothiazide (MAXZIDE-25) 37.5-25 MG per tablet Take 1 tablet by mouth daily.     . metoprolol succinate (TOPROL XL) 25 MG 24 hr tablet Take 1 tablet (25 mg total) by mouth daily. 90 tablet 3   No current facility-administered medications for this visit.     Allergies:   Chlorhexidine; Ciprofloxacin; Prednisone; Codeine; and Cortisone    Social History:  The patient  reports that she has quit smoking. Her smoking use included cigarettes. She has a 7.50 pack-year smoking history. She has never used smokeless tobacco. She reports that she drinks alcohol. She reports that she does not use drugs.   Family History:  The patient's family history includes Atrial fibrillation in her maternal aunt, maternal uncle, and mother; Bladder Cancer in her maternal uncle; Brain cancer in her father; Endometrial cancer (age of onset: 48) in her mother; Hearing loss in her mother; Heart disease in her maternal aunt; Lung cancer in her father and maternal uncle; Multiple myeloma in her maternal uncle; Other in her son; Stroke in her maternal grandfather and maternal grandmother.    ROS:  General:no colds or fevers, no weight changes Skin:no rashes or ulcers HEENT:no blurred vision, no congestion CV:see HPI PUL:see HPI GI:no diarrhea constipation or melena, no indigestion GU:no hematuria, no dysuria MS:no joint pain, no claudication Neuro:no syncope, no lightheadedness Endo:+ diabetes, no thyroid disease  Wt Readings from Last 3 Encounters:  08/06/18 192 lb (87.1 kg)  07/24/18 185 lb (83.9 kg)  07/23/18 193 lb 6.4 oz (87.7 kg)     PHYSICAL EXAM: VS:  BP 118/88   Pulse 73   Ht 5' 5"   (1.651 m)   Wt 192 lb (87.1 kg)   SpO2 98%   BMI 31.95 kg/m  , BMI Body mass index is 31.95 kg/m. General:Pleasant affect, NAD Skin:Warm and dry, brisk capillary refill HEENT:normocephalic, sclera clear, mucus membranes moist Neck:supple, no JVD, no bruits  Heart:S1S2 RRR without murmur, gallup, rub or click Lungs:clear without rales, rhonchi, or wheezes ZTI:WPYK, non tender, + BS, do not palpate liver spleen or masses Ext:no lower ext edema, 2+ pedal pulses, 2+ radial pulses Neuro:alert and oriented X 3, MAE, follows commands, + facial symmetry    EKG:  EKG is ordered today. The ekg ordered today demonstrates SR with mild LVH but same as before.    Recent Labs: 07/10/2018: ALT 49;  BUN 18; Creatinine, Ser 0.74; Hemoglobin 13.8; Magnesium 2.1; Platelets 311.0; Potassium 3.7; Sodium 139; TSH 1.78    Lipid Panel    Component Value Date/Time   CHOL 243 (HH) 01/12/2008 0815   TRIG 198 (H) 01/12/2008 0815   TRIG 209 (HH) 09/22/2006 1056   HDL 38.4 (L) 01/12/2008 0815   CHOLHDL 6.3 CALC 01/12/2008 0815   VLDL 40 01/12/2008 0815   LDLDIRECT 175.2 01/12/2008 0815       Other studies Reviewed: Additional studies/ records that were reviewed today include: . Cardiac cath 07/25/18 Normal coronary arteries in a dominant RCA system.  LVEDP 10 mm Hg.  RECOMMENDATION: Suspect noncardiac etiology to the patient's chest tightness.  Consider echo Doppler study to assess LV function.  No indication for antiplatelet therapy at this time.  Echo 07/28/18 Study Conclusions  - Left ventricle: The cavity size was normal. Wall thickness was   normal. Systolic function was normal. The estimated ejection   fraction was in the range of 55% to 60%. Wall motion was normal;   there were no regional wall motion abnormalities. Doppler   parameters are consistent with abnormal left ventricular   relaxation (grade 1 diastolic dysfunction). - Right ventricle: Systolic function was mildly to  moderately   reduced.  ASSESSMENT AND PLAN:  1.  Chest pain non cardiac, with patent coronary arteries.  Suggested protonix.  2.  Palpitations has monitor, will stop amlodipine and add toprol XL 25 mg daily, she will monitor BP.  We will have her follow up in 3-4 weeks for BP check and results of monitor.    We should have monitor of premature beats and then with starting BB will see how this does.  3.  HTN controlled  4.  DM-2 per PCP   Current medicines are reviewed with the patient today.  The patient Has no concerns regarding medicines.  The following changes have been made:  See above Labs/ tests ordered today include:see above  Disposition:   FU:  see above  Signed, Cecilie Kicks, NP  08/06/2018 12:37 PM    [facility], [place], [place], [place] Phone: [phone]; Fax: [phone]

## 2018-08-21 ENCOUNTER — Telehealth: Payer: Self-pay | Admitting: *Deleted

## 2018-08-21 NOTE — Telephone Encounter (Signed)
Received Medical records from [facility]; forwarded to provider/SLS 11/22

## 2018-08-25 ENCOUNTER — Other Ambulatory Visit: Payer: Self-pay | Admitting: Family Medicine

## 2018-08-31 ENCOUNTER — Ambulatory Visit (INDEPENDENT_AMBULATORY_CARE_PROVIDER_SITE_OTHER): Payer: 59 | Admitting: Nurse Practitioner

## 2018-08-31 ENCOUNTER — Encounter: Payer: Self-pay | Admitting: Nurse Practitioner

## 2018-08-31 VITALS — BP 160/98 | HR 77 | Ht 65.0 in | Wt 194.8 lb

## 2018-08-31 DIAGNOSIS — I10 Essential (primary) hypertension: Secondary | ICD-10-CM | POA: Diagnosis not present

## 2018-08-31 DIAGNOSIS — R002 Palpitations: Secondary | ICD-10-CM

## 2018-08-31 MED ORDER — DILTIAZEM HCL ER COATED BEADS 240 MG PO CP24: 240.0000 mg | ORAL_CAPSULE | Freq: Every day | ORAL | 3 refills | Status: DC

## 2018-08-31 NOTE — Patient Instructions (Addendum)
We will be checking the following labs today - NONE  If you have labs (blood work) drawn today and your tests are completely normal, you will receive your results only by: Marland Kitchen MyChart Message (if you have MyChart) OR . A paper copy in the mail If you have any lab test that is abnormal or we need to change your treatment, we will call you to review the results.   Medication Instructions:    Continue with your current medicines. BUT  Stop TOPROL  Start Diltiazem 240 mg a day - this is at the drug store.    If you need a refill on your cardiac medications before your next appointment, please call your pharmacy.     Testing/Procedures To Be Arranged:  N/A  Follow-Up:   See Dr. Johnsie Cancel in a few months for follow up.    At [facility], you and your health needs are our priority.  As part of our continuing mission to provide you with exceptional heart care, we have created designated Provider Care Teams.  These Care Teams include your primary Cardiologist (physician) and Advanced Practice Providers (APPs -  Physician Assistants and Nurse Practitioners) who all work together to provide you with the care you need, when you need it.  Special Instructions:  Marland Kitchen Monitor your BP for [place] . Restrict your salt  Call the [place] office at [phone] if you have any questions, problems or concerns.

## 2018-08-31 NOTE — Progress Notes (Signed)
CARDIOLOGY OFFICE NOTE  Date:  08/31/2018    Alexandria Bradley Date of Birth: [birth year] Medical Record #[id number]  PCP:  Alexandria Pal, DO  Cardiologist:  Alexandria Bradley    Chief Complaint  Patient presents with  . Palpitations    Follow up visit - seen for Dr. Johnsie Bradley    History of Present Illness: Alexandria Bradley is a 57 y.o. female who presents today for a one month check. Seen for Dr. Johnsie Bradley.   She has had a history of palpitations with prior negative monitor and normal stress echo. Has had prior breast cancer with chemo with follow up echo showing normal EF. Other issues include CKD, HTN, DM and HLD.  Has followed with Alexandria Kicks, NP over the past few years. In October she endorsed more palpitations and chest pain with SOB. She ended up having cardiac cath - normal coronaries noted. Echo updated and this was stable except for mild/moderate reduction in RV function. Event monitor in progress.   Comes in today. Here alone. Does not feel well with the Toprol - makes her too fatigued.  Having headaches. Not checking BP here recently. Has had too much salt with the recent holiday. She wishes she was on more thyroid medicine despite her recent level being ok. Does not feel refreshed with sleeping. Snoring. Was to have had a sleep study in the remote past but did not follow thru. No real exercise. Has gained weight. She wishes to change the Toprol. Her palpitations seemed to have settled down prior to this last med change.   Past Medical History:  Diagnosis Date  . Anxiety   . Arthritis   . Back pain   . Breast cancer ([place])    left  . GERD (gastroesophageal reflux disease)   . Headache(784.0)    PMH : migraines  . Hypercholesterolemia   . Hypertension   . Hypothyroidism   . Kidney stones   . Osteoporosis   . PONV (postoperative nausea and vomiting)   . Psoriasis   . PVC's (premature ventricular contractions)   . Radiation 08/18/14-10/07/14   Left breast     Past Surgical History:  Procedure Laterality Date  . BREAST LUMPECTOMY WITH AXILLARY LYMPH NODE BIOPSY  6/15   left  . CHOLECYSTECTOMY    . COLONOSCOPY N/A 06/27/2014   Procedure: COLONOSCOPY;  Surgeon: Lear Ng, MD;  Location: [facility] ENDOSCOPY;  Service: Endoscopy;  Laterality: N/A;  . LEFT HEART CATH AND CORONARY ANGIOGRAPHY N/A 07/24/2018   Procedure: LEFT HEART CATH AND CORONARY ANGIOGRAPHY;  Surgeon: Troy Sine, MD;  Location: [place] [facility];  Service: Cardiovascular;  Laterality: N/A;  . LIPOMA EXCISION    . MYOMECTOMY    . PORT-A-CATH REMOVAL N/A 02/03/2015   Procedure: REMOVAL PORT-A-CATH;  Surgeon: [name], MD;  Location: [place];  Service: General;  Laterality: N/A;  . PORTACATH PLACEMENT N/A 03/01/2014   Procedure: INSERTION PORT-A-CATH;  Surgeon: [name], MD;  Location: [place];  Service: General;  Laterality: N/A;  . TONSILLECTOMY    . TUBAL LIGATION       Medications: Current Meds  Medication Sig  . acetaminophen (TYLENOL) 500 MG tablet Take 1,000 mg by mouth at bedtime.   . betamethasone dipropionate (DIPROLENE) 0.05 % cream Apply 1 application topically daily as needed (psoriasis).  . fluticasone (FLONASE) 50 MCG/ACT nasal spray SPRAY 2 SPRAYS INTO EACH NOSTRIL EVERY DAY  . liothyronine (CYTOMEL) 5 MCG tablet Take  5 mcg by mouth daily before breakfast.   . loratadine (CLARITIN) 10 MG tablet Take 10 mg by mouth daily.  . metFORMIN (GLUCOPHAGE) 500 MG tablet Take 1,000 mg by mouth daily after supper.   . propranolol (INDERAL) 10 MG tablet Take 10 mg by mouth daily as needed (heart palpitations).   . TIROSINT 100 MCG CAPS Take 100 mcg by mouth daily before breakfast.   . triamterene-hydrochlorothiazide (MAXZIDE-25) 37.5-25 MG per tablet Take 1 tablet by mouth daily.   . [DISCONTINUED] metoprolol succinate (TOPROL XL) 25 MG 24 hr tablet Take 1 tablet (25 mg total) by mouth daily.      Allergies: Allergies  Allergen Reactions  . Chlorhexidine Itching  . Ciprofloxacin Anxiety, Rash and Other (See Comments)    GI upset  . Prednisone Shortness Of Breath and Other (See Comments)    "jacks me up" jittery   . Codeine Nausea Only  . Cortisone Swelling and Other (See Comments)    "jack me up"     Social History: The patient  reports that she has quit smoking. Her smoking use included cigarettes. She has a 7.50 pack-year smoking history. She has never used smokeless tobacco. She reports that she drinks alcohol. She reports that she does not use drugs.   Family History: The patient's family history includes Atrial fibrillation in her maternal aunt, maternal uncle, and mother; Bladder Cancer in her maternal uncle; Brain cancer in her father; Endometrial cancer (age of onset: 66) in her mother; Hearing loss in her mother; Heart disease in her maternal aunt; Lung cancer in her father and maternal uncle; Multiple myeloma in her maternal uncle; Other in her son; Stroke in her maternal grandfather and maternal grandmother.   Review of Systems: Please see the history of present illness.   Otherwise, the review of systems is positive for none.   All other systems are reviewed and negative.   Physical Exam: VS:  BP (!) 160/98 (BP Location: Left Arm, Patient Position: Sitting, Cuff Size: Normal)   Pulse 77   Ht 5' 5"  (1.651 m)   Wt 194 lb 12.8 oz (88.4 kg)   SpO2 96% Comment: at rest  BMI 32.42 kg/m  .  BMI Body mass index is 32.42 kg/m.  Wt Readings from Last 3 Encounters:  08/31/18 194 lb 12.8 oz (88.4 kg)  08/06/18 192 lb (87.1 kg)  07/24/18 185 lb (83.9 kg)   BP is 160/100 by me.  General: Pleasant. Obese. Alert and in no acute distress.  Weight is up 9 pounds.  HEENT: Normal.  Neck: Supple, no JVD, carotid bruits, or masses noted.  Cardiac: Regular rate and rhythm. No murmurs, rubs, or gallops. No edema.  Respiratory:  Lungs are clear to auscultation  bilaterally with normal work of breathing.  GI: Soft and nontender.  MS: No deformity or atrophy. Gait and ROM intact.  Skin: Warm and dry. Color is normal.  Neuro:  Strength and sensation are intact and no gross focal deficits noted.  Psych: Alert, appropriate and with normal affect.   LABORATORY DATA:  EKG:  EKG is not ordered today.  Lab Results  Component Value Date   WBC 6.3 07/10/2018   HGB 13.8 07/10/2018   HCT 40.7 07/10/2018   PLT 311.0 07/10/2018   GLUCOSE 104 (H) 07/10/2018   CHOL 243 (HH) 01/12/2008   TRIG 198 (H) 01/12/2008   HDL 38.4 (L) 01/12/2008   LDLDIRECT 175.2 01/12/2008   ALT 49 (H) 07/10/2018   AST 28  07/10/2018   NA 139 07/10/2018   K 3.7 07/10/2018   CL 101 07/10/2018   CREATININE 0.74 07/10/2018   BUN 18 07/10/2018   CO2 28 07/10/2018   TSH 1.78 07/10/2018     BNP (last 3 results) No results for input(s): BNP in the last 8760 hours.  ProBNP (last 3 results) No results for input(s): PROBNP in the last 8760 hours.   Other Studies Reviewed Today:  Event Monitor 06/2018 Study Highlights   NSR Isolated PVCls  No significant NSVT Subjective feeling of skips and fluttering does usually  Correlate with PVCls      Cardiac cath 07/25/18 Normal coronary arteries in a dominant RCA system.  LVEDP 10 mm Hg.  RECOMMENDATION: Suspect noncardiac etiology to the patient's chest tightness. Consider echo Doppler study to assess LV function.  No indication for antiplatelet therapy at this time.  Echo 07/28/18 Study Conclusions  - Left ventricle: The cavity size was normal. Wall thickness was normal. Systolic function was normal. The estimated ejection fraction was in the range of 55% to 60%. Wall motion was normal; there were no regional wall motion abnormalities. Doppler parameters are consistent with abnormal left ventricular relaxation (grade 1 diastolic dysfunction). - Right ventricle: Systolic function was mildly to  moderately reduced.    Assessment/Plan:  1.  Palpitations - improved prior to med change. Monitor with PVCs noted.   2. HTN - not controlled - does not like the Toprol due to side effects - will change back to CCB. Could consider Bystolic going forward. Needs salt restriction and to get back to monitoring.   3. Hypothyroid  4. DM - per PCP  Current medicines are reviewed with the patient today.  The patient does not have concerns regarding medicines other than what has been noted above.  The following changes have been made:  See above.  Labs/ tests ordered today include:   No orders of the defined types were placed in this encounter.    Disposition:   FU with Dr. Johnsie Bradley in a few months. I told her to send [place] a MyChart as needed.   Patient is agreeable to this plan and will call if any problems develop in the interim.   SignedTruitt Merle, NP  08/31/2018 3:02 PM  [facility] [address] [place], [place]  [address] Phone: [phone] Fax: [phone]

## 2018-10-08 ENCOUNTER — Telehealth: Payer: Self-pay | Admitting: *Deleted

## 2018-10-08 ENCOUNTER — Telehealth: Payer: Self-pay

## 2018-10-08 NOTE — Telephone Encounter (Signed)
Called patient back in regards to lump and scheduling appt.  Patient has scheduled for 10/15/18.  She says that she may not keep this appt because she has an appt with Dr. Donne Hazel coming up.  Pt states "I will let you all know by Monday".

## 2018-10-08 NOTE — Telephone Encounter (Signed)
Can patient come in today for [facility] or tomorrow for LC eval?

## 2018-10-08 NOTE — Telephone Encounter (Signed)
Received TC from patient. She states that she has a 'lump' on her lumpectomy incisional site that seems to be getting larger.  She denies any pain, tenderness, redness, warmth to that area. She would like to have this area evaluated. She is due for her next mammogram in March and not scheduled to see Dr. Jana Hakim until September  2020. Please advise patient in next step.

## 2018-10-08 NOTE — Telephone Encounter (Signed)
LVM for patient to call back so we can make an appointment for her to see NP or PA

## 2018-10-12 ENCOUNTER — Telehealth: Payer: Self-pay | Admitting: Adult Health

## 2018-10-12 NOTE — Telephone Encounter (Signed)
Called patient per 1/13 sch message - left message for patient to call back if r/s is still needed.

## 2018-10-12 NOTE — Telephone Encounter (Signed)
duplicate

## 2018-10-13 ENCOUNTER — Telehealth: Payer: Self-pay | Admitting: Oncology

## 2018-10-13 NOTE — Telephone Encounter (Signed)
Appointment for 1/16 already cancelled by desk nurse. Returned call and left message for patient re calling office if she wishes to reschedule.

## 2018-10-15 ENCOUNTER — Ambulatory Visit: Payer: 59 | Admitting: Adult Health

## 2018-10-30 ENCOUNTER — Other Ambulatory Visit: Payer: Self-pay | Admitting: General Surgery

## 2018-10-30 DIAGNOSIS — N6320 Unspecified lump in the left breast, unspecified quadrant: Secondary | ICD-10-CM

## 2018-11-05 ENCOUNTER — Other Ambulatory Visit: Payer: Self-pay | Admitting: General Surgery

## 2018-11-05 ENCOUNTER — Ambulatory Visit
Admission: RE | Admit: 2018-11-05 | Discharge: 2018-11-05 | Disposition: A | Discharge status: Home / Self Care | Payer: 59 | Source: Ambulatory Visit | Attending: General Surgery | Admitting: General Surgery

## 2018-11-05 DIAGNOSIS — N6320 Unspecified lump in the left breast, unspecified quadrant: Secondary | ICD-10-CM

## 2018-11-05 HISTORY — DX: Personal history of antineoplastic chemotherapy: Z92.21

## 2018-11-05 HISTORY — DX: Personal history of irradiation: Z92.3

## 2018-11-10 ENCOUNTER — Ambulatory Visit
Admission: RE | Admit: 2018-11-10 | Discharge: 2018-11-10 | Disposition: A | Discharge status: Home / Self Care | Payer: 59 | Source: Ambulatory Visit | Attending: General Surgery | Admitting: General Surgery

## 2018-11-10 DIAGNOSIS — N6320 Unspecified lump in the left breast, unspecified quadrant: Secondary | ICD-10-CM

## 2018-11-10 HISTORY — PX: BREAST BIOPSY: SHX20

## 2018-11-11 ENCOUNTER — Telehealth: Payer: Self-pay | Admitting: *Deleted

## 2018-11-11 ENCOUNTER — Other Ambulatory Visit: Payer: Self-pay | Admitting: Oncology

## 2018-11-11 DIAGNOSIS — Z171 Estrogen receptor negative status [ER-]: Principal | ICD-10-CM

## 2018-11-11 DIAGNOSIS — C50212 Malignant neoplasm of upper-inner quadrant of left female breast: Secondary | ICD-10-CM

## 2018-11-11 NOTE — Telephone Encounter (Signed)
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 Gave Dr. Donne Hazel nurse appt to see Dr. Jana Hakim on 2/14 at 3:30.

## 2018-11-12 ENCOUNTER — Ambulatory Visit
Admission: RE | Admit: 2018-11-12 | Discharge: 2018-11-12 | Disposition: A | Discharge status: Home / Self Care | Payer: 59 | Source: Ambulatory Visit | Attending: General Surgery | Admitting: General Surgery

## 2018-11-12 ENCOUNTER — Other Ambulatory Visit: Payer: Self-pay | Admitting: General Surgery

## 2018-11-12 DIAGNOSIS — C50912 Malignant neoplasm of unspecified site of left female breast: Secondary | ICD-10-CM

## 2018-11-12 MED ORDER — IOPAMIDOL (ISOVUE-300) INJECTION 61%
100.0000 mL | Freq: Once | INTRAVENOUS | Status: AC | PRN
  Administered 2018-11-12: 100 mL via INTRAVENOUS

## 2018-11-12 NOTE — Progress Notes (Signed)
[place]  [phone]) [phone] [phone]) [phone]    ID: Alexandria Bradley OB: Jan 30, 1961  MR#: [id number]  YJE#:[id number]  Patient Care Team: [name], DO as PCP - General (Family Medicine) Josue Hector, MD as PCP - Cardiology (Cardiology) , Virgie Dad, MD as Consulting Physician (Oncology) Brien Few, MD as Consulting Physician (Obstetrics and Gynecology) [name], MD as Consulting Physician (General Surgery) [name], MD as Referring Physician (Radiation Oncology) Delrae Rend, MD as Consulting Physician (Endocrinology) Wilford Corner, MD as Consulting Physician (Gastroenterology) Dorna Leitz, MD as Consulting Physician (Orthopedic Surgery) OTHER MD:    CHIEF COMPLAINT: Triple negative breast cancer, locally recurrent  CURRENT TREATMENT: Neoadjuvant chemotherapy   INTERVAL HISTORY: We last saw Alexandria Bradley here on 06/25/2018, at which time she was set up for repeat mammography April 2020.  More recently however over the past several months she noted increasing fullness and lumpiness associated with her left breast scar.  She did not bring this immediately to medical attention but she did mention that to Dr. Donne Hazel and her follow-up appointment with him.  She was set up for left diagnostic mammogram with tomography and a left breast ultrasound on 11/05/2018 showing: increased fullness and density in the region of the patient's scar. On 3D imaging, there appears to be a possible underlying obscured mass. On physical exam, a lump is felt at the lumpectomy site. Targeted ultrasound is performed, showing a mass in close proximity to the scar at 10 o'clock, 7 cm from the nipple measuring 1.4 x 1.2 x 1.3 cm. This is more masslike than typical or classic for a scar and is worrisome for recurrence. No axillary adenopathy.  Biopsy of the site in question was performed on 11/10/2018. The pathology from this procedure showed  ([id number]): invasive ductal carcinoma, grade II-III. Prognostic profile results show a triple negative tumor with an MIB-1 of 80%.  Staging studies were obtained 11/12/2018, with CT of the chest with contrast showing two, 2-3 mm right middle lobe nodules. These most likely benign. A portion of 1 small nodule was suggested on the lung bases from an abdomen and pelvis CT dated 06/17/2014. Metastatic disease is not excluded, but felt unlikely. There are no other findings to suggest metastatic disease from breast carcinoma to the chest, abdomen or pelvis. No acute abnormalities. Changes from previous left lumpectomy with mild left upper lobe anterolateral radiation induce lung scarring. Hepatic steatosis.  She also underwent a whole body bone scan today, 11/13/2018 showing Uptake at the shoulders, sternoclavicular joints, elbows, hands, and knees, typically degenerative. Foci of abnormal increased tracer localization in the midthoracic spine at approximately T5 or T4, and at the LEFT lateral aspect of [id number] uncertain etiology; degenerative changes are present at these sites but no definite lytic or sclerotic bone lesions are seen. No additional sites of abnormal osseous tracer accumulation are Identified. Expected urinary tract and soft tissue distribution of tracer. 3 mm or less right middle lobe nodules which are felt to be benign and on the CT of the abdomen and pelvis no findings to suggest metastatic disease in the liver or bones.  Hepatic steatosis was incidentally noted.  [name] returns today to discuss these new developments accompanied by her husband.   REVIEW OF SYSTEMS: Alexandria Bradley is frustrated with her new diagnosis. She has recently has some severe palpitations coupled with shortness of breath, and was told to watch her caffeine intake and changed her blood pressure medications. She has headaches, but this is unchanged from her  normal. She has vertigo and she can get disoriented as a result. 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 The patient denies unusual headaches, visual changes, nausea, vomiting, or dizziness. There has been no unusual cough, phlegm production, or pleurisy. This been no change in bowel or bladder habits. The patient denies unexplained fatigue or unexplained weight loss, bleeding, rash, or fever. A detailed review of systems was otherwise noncontributory.    BREAST CANCER HISTORY: From the original intake note of 02/16/2014:  Alexandria Bradley palpated a mass in her left breast early May and brought it immediately to her gynecologist attention. He set her up for bilateral diagnostic mammography and left ultrasonography at [facility] 02/07/2014. Mammography showed a 1.3 cm oval mass with spiculated margins in the left breast at the 11:00 position. This was palpable. Ultrasound showed a 1.1 cm tall her than wide mass with a microlobulated margins. He was hypoechoic. There were no abnormalities noted in the left axilla.  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 Lyndon Code at conference 02/16/2014), triple negative, with an MIB-1 of 50%.  The patient's subsequent history is as detailed below   PAST MEDICAL HISTORY: Past Medical History:  Diagnosis Date  . Anxiety   . Arthritis   . Back pain   . Breast cancer ([place])    left  . GERD (gastroesophageal reflux disease)   . Headache(784.0)    PMH : migraines  . Hypercholesterolemia   . Hypertension   . Hypothyroidism   . Kidney stones   . Osteoporosis   . Personal history of chemotherapy   . Personal history of radiation therapy   . PONV (postoperative nausea and vomiting)   . Psoriasis   . PVC's (premature ventricular contractions)   . Radiation 08/18/14-10/07/14   Left breast    PAST SURGICAL HISTORY: Past Surgical  History:  Procedure Laterality Date  . BREAST LUMPECTOMY WITH AXILLARY LYMPH NODE BIOPSY  6/15   left  . CHOLECYSTECTOMY    . COLONOSCOPY N/A 06/27/2014   Procedure: COLONOSCOPY;  Surgeon: Lear Ng, MD;  Location: [facility] ENDOSCOPY;  Service: Endoscopy;  Laterality: N/A;  . LEFT HEART CATH AND CORONARY ANGIOGRAPHY N/A 07/24/2018   Procedure: LEFT HEART CATH AND CORONARY ANGIOGRAPHY;  Surgeon: Troy Sine, MD;  Location: [facility];  Service: Cardiovascular;  Laterality: N/A;  . LIPOMA EXCISION    . MYOMECTOMY    . PORT-A-CATH REMOVAL N/A 02/03/2015   Procedure: REMOVAL PORT-A-CATH;  Surgeon: [name], MD;  Location: [place];  Service: General;  Laterality: N/A;  . PORTACATH PLACEMENT N/A 03/01/2014   Procedure: INSERTION PORT-A-CATH;  Surgeon: [name], MD;  Location: [place];  Service: General;  Laterality: N/A;  . TONSILLECTOMY    . TUBAL LIGATION      FAMILY HISTORY Family History  Problem Relation Age of Onset  . Endometrial cancer Mother 22  . Hearing loss Mother   . Atrial fibrillation Mother   . Lung cancer Father   . Brain cancer Father   . Heart disease Maternal Aunt   . Atrial fibrillation Maternal Aunt   . Lung cancer Maternal Uncle   . Atrial fibrillation Maternal Uncle   . Stroke Maternal Grandmother   . Stroke Maternal Grandfather   . Bladder Cancer  Maternal Uncle   . Multiple myeloma Maternal Uncle   . Other Son        trisomy 1   The patient's father died at the age of 9 from what may have been metastatic lung cancer. The patient knows very little about the father's side of her family. Her mother is alive at age 69. She was recently diagnosed with endometrial cancer. The patient has one brother, 2 sisters. There is no history of breast or ovarian cancer in the family.   GYNECOLOGIC HISTORY:  Menarche age 36, first live birth age 34. The patient is GX P3. One child died shortly after birth.  She stopped having periods approximately 2005. She did not use hormone replacement. 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 Son [name] is  Nurse, adult in [place]. Daughter [name] lives in [place] and works for [employer] as an Therapist, sports. She has three grandchildren.    ADVANCED DIRECTIVES: Not in place   HEALTH MAINTENANCE: Social History   Tobacco Use  . Smoking status: Former Smoker    Packs/day: 0.50    Years: 15.00    Pack years: 7.50    Types: Cigarettes  . Smokeless tobacco: Never Used  . Tobacco comment: Quit smoking cigarettes in 2002  Substance Use Topics  . Alcohol use: Yes    Comment: social  . Drug use: No     Colonoscopy: 2010  PAP: 2013  Bone density: 05/15/2009, at [facility], normal, lowest T score -0.8  Lipid panel:  Allergies  Allergen Reactions  . Chlorhexidine Itching  . Ciprofloxacin Anxiety, Rash and Other (See Comments)    GI upset  . Prednisone Shortness Of Breath and Other (See Comments)    "jacks me up" jittery   . Codeine Nausea Only  . Cortisone Swelling and Other (See Comments)    "jack me up"     Current Outpatient Medications  Medication Sig Dispense Refill  . acetaminophen (TYLENOL) 500 MG tablet Take 1,000 mg by mouth at bedtime.     . betamethasone dipropionate (DIPROLENE) 0.05 % cream Apply 1 application topically daily as needed (psoriasis).    Marland Kitchen dexamethasone (DECADRON) 4 MG tablet Take 2 tablets (8 mg total) by mouth daily. Start the day after chemotherapy for 2 days. Take with food. 30 tablet 1  . diltiazem (CARDIZEM CD) 240 MG 24 hr capsule Take 1 capsule (240 mg total) by mouth daily. 90 capsule 3  . fluticasone (FLONASE) 50 MCG/ACT nasal spray  SPRAY 2 SPRAYS INTO EACH NOSTRIL EVERY DAY 48 g 1  . lidocaine-prilocaine (EMLA) cream Apply to affected area once 30 g 3  . liothyronine (CYTOMEL) 5 MCG tablet Take 5 mcg by mouth daily before breakfast.     . loratadine (CLARITIN) 10 MG tablet Take 10 mg by mouth daily.    . metFORMIN (GLUCOPHAGE) 500 MG tablet Take 1,000 mg by mouth daily after supper.     . prochlorperazine (COMPAZINE) 10 MG tablet Take 1 tablet (10 mg total) by mouth every 6 (six) hours as needed (Nausea or vomiting). 30 tablet 1  . propranolol (INDERAL) 10 MG tablet Take 10 mg by mouth daily as needed (heart palpitations).     . TIROSINT 100 MCG CAPS Take 100  mcg by mouth daily before breakfast.   4  . triamterene-hydrochlorothiazide (MAXZIDE-25) 37.5-25 MG per tablet Take 1 tablet by mouth daily.      No current facility-administered medications for this visit.     OBJECTIVE: Middle-aged white woman no acute distress Vitals:   11/13/18 1542  BP: (!) 145/77  Pulse: 63  Resp: 18  Temp: 98.2 F (36.8 C)  SpO2: 98%     Body mass index is 31.17 kg/m.    ECOG FS:1 - Symptomatic but completely ambulatory  Sclerae unicteric, EOMs intact Oropharynx clear and moist No cervical or supraclavicular adenopathy Lungs no rales or rhonchi Heart regular rate and rhythm Abd soft, nontender, positive bowel sounds MSK no focal spinal tenderness, no upper extremity lymphedema Neuro: nonfocal, well oriented, appropriate affect Breasts: The right breast is unremarkable.  The mass on the left breast is easily palpable, movable, not associated with the skin or nipple.  Both axillae are benign.     LAB RESULTS: No results found for: SPEP  Lab Results  Component Value Date   WBC 7.3 11/13/2018   NEUTROABS 4.6 11/13/2018   HGB 13.8 11/13/2018   HCT 41.1 11/13/2018   MCV 91.7 11/13/2018   PLT 299 11/13/2018      Chemistry      Component Value Date/Time   NA 140 11/13/2018 1526   NA 140 06/19/2017 1312   K 3.4 (L)  11/13/2018 1526   K 3.5 06/19/2017 1312   CL 104 11/13/2018 1526   CO2 23 11/13/2018 1526   CO2 25 06/19/2017 1312   BUN 12 11/13/2018 1526   BUN 18.4 06/19/2017 1312   CREATININE 0.76 11/13/2018 1526   CREATININE 0.8 06/19/2017 1312      Component Value Date/Time   CALCIUM 9.9 11/13/2018 1526   CALCIUM 10.2 06/19/2017 1312   ALKPHOS 70 11/13/2018 1526   ALKPHOS 86 06/19/2017 1312   AST 33 11/13/2018 1526   AST 15 06/19/2017 1312   ALT 50 (H) 11/13/2018 1526   ALT 22 06/19/2017 1312   BILITOT 0.3 11/13/2018 1526   BILITOT 0.26 06/19/2017 1312       No results found for: LABCA2  No components found for: LABCA125  No results for input(s): INR in the last 168 hours.  Urinalysis    Component Value Date/Time   COLORURINE YELLOW 03/13/2009 2251   APPEARANCEUR CLOUDY (A) 03/13/2009 2251   LABSPEC 1.020 06/15/2014 1101   PHURINE 6.0 06/15/2014 1101   PHURINE 5.5 03/13/2009 2251   GLUCOSEU Negative 06/15/2014 1101   HGBUR Negative 06/15/2014 1101   HGBUR NEGATIVE 03/13/2009 2251   BILIRUBINUR Color Interference 06/15/2014 1101   KETONESUR Negative 06/15/2014 1101   KETONESUR NEGATIVE 03/13/2009 2251   PROTEINUR 30 06/15/2014 1101   PROTEINUR NEGATIVE 03/13/2009 2251   UROBILINOGEN 0.2 06/15/2014 1101   NITRITE Negative 06/15/2014 1101   NITRITE NEGATIVE 03/13/2009 2251   LEUKOCYTESUR Trace 06/15/2014 1101    STUDIES: Ct Chest W Contrast  Result Date: 11/12/2018 CLINICAL DATA:  Recurrent breast ca - Dx'd yesterday? Metastasis SOB occasionallyRLQ pain off and on x few weeksHx left breast ca - chemo/radiation and lumpectomyHx myomectomy, GB, tubalPrior in PACSFormer smoker EXAM: CT CHEST, ABDOMEN, AND PELVIS WITH CONTRAST TECHNIQUE: Multidetector CT imaging of the chest, abdomen and pelvis was performed following the standard protocol during bolus administration of intravenous contrast. Creatinine was obtained on site at [facility] at [address] Results:  Creatinine 0.7 mg/dL. CONTRAST:  163m ISOVUE-300 IOPAMIDOL (  ISOVUE-300) INJECTION 61% COMPARISON:  Abdomen and pelvis CT, 02/15/2016. FINDINGS: CT CHEST FINDINGS Cardiovascular: Heart is normal in size and configuration. No pericardial effusion. No coronary artery calcifications. Great vessels are normal in caliber. No aortic atherosclerosis. Arch branch vessels are widely patent. Mediastinum/Nodes: No neck base, axillary, mediastinal or hilar masses or enlarged lymph nodes. Trachea and esophagus are unremarkable. Lungs/Pleura: 2 small, 2-3 mm, nodules, peripheral right middle lobe, images number 77 and 79, series 7. Small dense nodule, left upper lobe, image 29, consistent with a healed granuloma. No other nodules. Mild peripheral reticular opacities in the anterolateral left upper lobe consistent with radiation induced fibrosis. Lungs otherwise clear.  No pleural effusion.  No pneumothorax. Musculoskeletal: No fracture or acute finding. No osteoblastic or osteolytic lesions. Postsurgical changes in the left breast. CT ABDOMEN PELVIS FINDINGS Hepatobiliary: 3-4 mm low-density lesion at the dome of the liver, most likely a cyst. Liver demonstrates diffuse decreased attenuation consistent with fatty infiltration. No other masses or focal lesions. Gallbladder surgically absent. No bile duct dilation. Pancreas: Unremarkable. No pancreatic ductal dilatation or surrounding inflammatory changes. Spleen: Normal in size without focal abnormality. Adrenals/Urinary Tract: , Orientation no adrenal masses. Kidneys and position normal size with symmetric enhancement and excretion. 4 mm low-density lesion, anterior left kidney at the midpole, consistent with a cyst. No other renal masses or lesions. No stones. No hydronephrosis. Normal ureters. Normal bladder. Stomach/Bowel: Stomach is within normal limits. Appendix appears normal. No evidence of bowel wall thickening, distention, or inflammatory changes. Vascular/Lymphatic:  Minimal aortic atherosclerosis. No other vascular abnormality. No lymphadenopathy. Reproductive: Uterus and bilateral adnexa are unremarkable. Other: No abdominal wall hernia or abnormality. No abdominopelvic ascites. Musculoskeletal: No fracture or acute finding. No osteoblastic or osteolytic lesions. IMPRESSION: 1. Two, 2-3 mm right middle lobe nodules. These most likely benign. A portion of 1 small nodule was suggested on the lung bases from an abdomen and pelvis CT dated 06/17/2014. Metastatic disease is not excluded, but felt unlikely. 2. There are no other findings to suggest metastatic disease from breast carcinoma to the chest, abdomen or pelvis. 3. No acute abnormalities. 4. Changes from previous left lumpectomy with mild left upper lobe anterolateral radiation induce lung scarring. 5. Hepatic steatosis. Electronically Signed   By: Lajean Manes M.D.   On: 11/12/2018 14:16   Nm Bone Scan Whole Body  Result Date: 11/13/2018 CLINICAL DATA:  LEFT breast cancer post radiation therapy, recurrent LEFT breast cancer question metastasis EXAM: NUCLEAR MEDICINE WHOLE BODY BONE SCAN TECHNIQUE: Whole body anterior and posterior images were obtained approximately 3 hours after intravenous injection of radiopharmaceutical. RADIOPHARMACEUTICALS:  21.6 mCi Technetium-42mMDP IV COMPARISON:  None Correlation: CT chest abdomen pelvis 11/12/2018 FINDINGS: Uptake at the shoulders, sternoclavicular joints, elbows, hands, and knees, typically degenerative. Foci of abnormal increased tracer localization in the midthoracic spine at approximately T5 or T4, and at the LEFT lateral aspect of TE72-[id number] etiology; degenerative changes are present at these sites but no definite lytic or sclerotic bone lesions are seen. No additional sites of abnormal osseous tracer accumulation are identified. Expected urinary tract and soft tissue distribution of tracer. IMPRESSION: Uptake at 2 sites in the thoracic spine, nonspecific but  potentially degenerative based on accompanying CT findings though metastases are not entirely excluded. No additional bone lesions are identified to suggest osseous metastatic disease. Electronically Signed   By: MLavonia DanaM.D.   On: 11/13/2018 13:58   Ct Abdomen Pelvis W Contrast  Result Date: 11/12/2018 CLINICAL DATA:  Recurrent  breast ca - Dx'd yesterday? Metastasis SOB occasionallyRLQ pain off and on x few weeksHx left breast ca - chemo/radiation and lumpectomyHx myomectomy, GB, tubalPrior in PACSFormer smoker EXAM: CT CHEST, ABDOMEN, AND PELVIS WITH CONTRAST TECHNIQUE: Multidetector CT imaging of the chest, abdomen and pelvis was performed following the standard protocol during bolus administration of intravenous contrast. Creatinine was obtained on site at [facility] at [address] Results: Creatinine 0.7 mg/dL. CONTRAST:  162m ISOVUE-300 IOPAMIDOL (ISOVUE-300) INJECTION 61% COMPARISON:  Abdomen and pelvis CT, 02/15/2016. FINDINGS: CT CHEST FINDINGS Cardiovascular: Heart is normal in size and configuration. No pericardial effusion. No coronary artery calcifications. Great vessels are normal in caliber. No aortic atherosclerosis. Arch branch vessels are widely patent. Mediastinum/Nodes: No neck base, axillary, mediastinal or hilar masses or enlarged lymph nodes. Trachea and esophagus are unremarkable. Lungs/Pleura: 2 small, 2-3 mm, nodules, peripheral right middle lobe, images number 77 and 79, series 7. Small dense nodule, left upper lobe, image 29, consistent with a healed granuloma. No other nodules. Mild peripheral reticular opacities in the anterolateral left upper lobe consistent with radiation induced fibrosis. Lungs otherwise clear.  No pleural effusion.  No pneumothorax. Musculoskeletal: No fracture or acute finding. No osteoblastic or osteolytic lesions. Postsurgical changes in the left breast. CT ABDOMEN PELVIS FINDINGS Hepatobiliary: 3-4 mm low-density lesion at the dome of  the liver, most likely a cyst. Liver demonstrates diffuse decreased attenuation consistent with fatty infiltration. No other masses or focal lesions. Gallbladder surgically absent. No bile duct dilation. Pancreas: Unremarkable. No pancreatic ductal dilatation or surrounding inflammatory changes. Spleen: Normal in size without focal abnormality. Adrenals/Urinary Tract: , Orientation no adrenal masses. Kidneys and position normal size with symmetric enhancement and excretion. 4 mm low-density lesion, anterior left kidney at the midpole, consistent with a cyst. No other renal masses or lesions. No stones. No hydronephrosis. Normal ureters. Normal bladder. Stomach/Bowel: Stomach is within normal limits. Appendix appears normal. No evidence of bowel wall thickening, distention, or inflammatory changes. Vascular/Lymphatic: Minimal aortic atherosclerosis. No other vascular abnormality. No lymphadenopathy. Reproductive: Uterus and bilateral adnexa are unremarkable. Other: No abdominal wall hernia or abnormality. No abdominopelvic ascites. Musculoskeletal: No fracture or acute finding. No osteoblastic or osteolytic lesions. IMPRESSION: 1. Two, 2-3 mm right middle lobe nodules. These most likely benign. A portion of 1 small nodule was suggested on the lung bases from an abdomen and pelvis CT dated 06/17/2014. Metastatic disease is not excluded, but felt unlikely. 2. There are no other findings to suggest metastatic disease from breast carcinoma to the chest, abdomen or pelvis. 3. No acute abnormalities. 4. Changes from previous left lumpectomy with mild left upper lobe anterolateral radiation induce lung scarring. 5. Hepatic steatosis. Electronically Signed   By: DLajean ManesM.D.   On: 11/12/2018 14:16   UKoreaBreast Ltd Uni Left Inc Axilla  Result Date: 11/05/2018 CLINICAL DATA:  Left lumpectomy in 2015. Increasing fullness and lumpiness at the scar site. EXAM: DIGITAL DIAGNOSTIC LEFT MAMMOGRAM WITH CAD AND TOMO ULTRASOUND  LEFT BREAST COMPARISON:  Previous exam(s). ACR Breast Density Category c: The breast tissue is heterogeneously dense, which may obscure small masses. FINDINGS: There appears to be increased fullness and density in the region of the patient's scar. On 3D imaging, there appears to be a possible underlying obscured mass. Mammographic images were processed with CAD. On physical exam, a lump is felt at the lumpectomy site. Targeted ultrasound is performed, showing a mass in close proximity to the scar at 10 o'clock, 7 cm from  the nipple measuring 14 x 12 x 13 mm. This is more masslike than typical or classic for a scar and is worrisome for recurrence. No axillary adenopathy. IMPRESSION: Suspicion for recurrence at the left lumpectomy site. RECOMMENDATION: Recommend ultrasound-guided biopsy of the left breast mass. I have discussed the findings and recommendations with the patient. Results were also provided in writing at the conclusion of the visit. If applicable, a reminder letter will be sent to the patient regarding the next appointment. BI-RADS CATEGORY  4: Suspicious. Electronically Signed   By: Dorise Bullion III M.D   On: 11/05/2018 13:41   Mm Diag Breast Tomo Uni Left  Result Date: 11/05/2018 CLINICAL DATA:  Left lumpectomy in 2015. Increasing fullness and lumpiness at the scar site. EXAM: DIGITAL DIAGNOSTIC LEFT MAMMOGRAM WITH CAD AND TOMO ULTRASOUND LEFT BREAST COMPARISON:  Previous exam(s). ACR Breast Density Category c: The breast tissue is heterogeneously dense, which may obscure small masses. FINDINGS: There appears to be increased fullness and density in the region of the patient's scar. On 3D imaging, there appears to be a possible underlying obscured mass. Mammographic images were processed with CAD. On physical exam, a lump is felt at the lumpectomy site. Targeted ultrasound is performed, showing a mass in close proximity to the scar at 10 o'clock, 7 cm from the nipple measuring 14 x 12 x 13 mm.  This is more masslike than typical or classic for a scar and is worrisome for recurrence. No axillary adenopathy. IMPRESSION: Suspicion for recurrence at the left lumpectomy site. RECOMMENDATION: Recommend ultrasound-guided biopsy of the left breast mass. I have discussed the findings and recommendations with the patient. Results were also provided in writing at the conclusion of the visit. If applicable, a reminder letter will be sent to the patient regarding the next appointment. BI-RADS CATEGORY  4: Suspicious. Electronically Signed   By: Dorise Bullion III M.D   On: 11/05/2018 13:41   Mm Clip Placement Left  Result Date: 11/10/2018 CLINICAL DATA:  Patient with palpable left breast mass at the lumpectomy site. EXAM: DIAGNOSTIC LEFT MAMMOGRAM POST ULTRASOUND BIOPSY COMPARISON:  Previous exam(s). FINDINGS: Mammographic images were obtained following ultrasound guided biopsy of left breast mass 10 o'clock position. Ribbon shaped marking clip in appropriate position. IMPRESSION: Appropriate position ribbon shaped marking clip status post ultrasound-guided biopsy left breast mass. Final Assessment: Post Procedure Mammograms for Marker Placement Electronically Signed   By: Lovey Newcomer M.D.   On: 11/10/2018 14:44   [place] Lt Breast Bx W Loc Dev 1st Lesion Img Bx Spec US Guide  Addendum Date: 11/11/2018   ADDENDUM REPORT: 11/11/2018 12:16 ADDENDUM: Pathology revealed GRADE II-III INVASIVE DUCTAL CARCINOMA of the Left breast, 10 o'clock. This was found to be concordant by Dr. Lovey Newcomer. Pathology results were discussed with the patient by telephone. The patient reported doing well after the biopsy with tenderness at the site. Post biopsy instructions and care were reviewed and questions were answered. The patient was encouraged to call [facility] for any additional concerns. Surgical consultation has been arranged with Dr. [name], per patient request, at [facility] on November 11, 2018. Pathology results reported by Terie Purser, RN on 11/11/2018. Electronically Signed   By: Lovey Newcomer M.D.   On: 11/11/2018 12:16   Result Date: 11/11/2018 CLINICAL DATA:  Patient with palpable mass within the upper inner left breast at the lumpectomy site. EXAM: ULTRASOUND GUIDED LEFT BREAST CORE NEEDLE BIOPSY COMPARISON:  Previous exam(s).  FINDINGS: I met with the patient and we discussed the procedure of ultrasound-guided biopsy, including benefits and alternatives. We discussed the high likelihood of a successful procedure. We discussed the risks of the procedure, including infection, bleeding, tissue injury, clip migration, and inadequate sampling. Informed written consent was given. The usual time-out protocol was performed immediately prior to the procedure. Lesion quadrant: Upper inner quadrant Using sterile technique and 1% Lidocaine as local anesthetic, under direct ultrasound visualization, a 12 gauge spring-loaded device was used to perform biopsy of left breast mass 10 o'clock position 7 cm from the nipple using a medial approach. At the conclusion of the procedure a ribbon shaped tissue marker clip was deployed into the biopsy cavity. Follow up 2 view mammogram was performed and dictated separately. IMPRESSION: Ultrasound guided biopsy of left breast mass. No apparent complications. Electronically Signed: By: Lovey Newcomer M.D. On: 11/10/2018 14:43    ASSESSMENT: 58 y.o. [place] woman status post left breast upper inner quadrant biopsy 02/08/2014 for a clinical T1c N0, stage IA invasive ductal carcinoma, grade 3, triple negative, with an MIB-1 of 50%.  (1) status post left lumpectomy and sentinel lymph node sampling 03/01/2014 for a pT1c pN0, stage IA invasive ductal carcinoma, grade 3, with negative margins, repeat prognostic panel again triple negative.  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  Left breast with breath hold technique/ 45 Gy at 1.8 Gy per fraction x 25 fractions.   Left breast boost/ 16 Gy at 2 Gy per fraction x 8 fractions  (4) genetics counseling 07/06/2015 through the Breast/Ovarian cancer gene panel offered by [facility] found no deleterious mutations in ATM, BARD1, BRCA1, BRCA2, BRIP1, CDH1, CHEK2, EPCAM, FANCC, MLH1, MSH2, MSH6, NBN, PALB2, PMS2, PTEN, RAD51C, RAD51D, TP53, and XRCC2.  (5) question of sleep apnea  (6) transverse colon lesion noted on CT scan from 06/17/2014 - negative biopsy, also showed fatty liver  (a) repeat CT scan of the abdomen and pelvis with contrast 02/15/2016 negative  RECURRENT DISEASE: February 2020 (7) biopsy of palpable mass upper inner left breast 11/10/2018 confirms invasive ductal carcinoma, grade 2 or 3, triple negative, with an MIB-1 of 80%.  (a) CT scans of the chest abdomen and pelvis with contrast showed no stage IV disease  (b) bone scan 11/13/2018 shows no definite metastasis to bone  (8) neoadjuvant chemotherapy will consist of carboplatin and gemcitabine given days 1 and 8 of each 21-day cycle, for 6 cycles  (9) bilateral mastectomies with left sentinel lymph node sampling and immediate reconstruction to follow   PLAN: Alexandria Bradley is nearly 5 years out from her original surgery.  She now has a recurrence in the same spot as before, with the same phenotype.  We discussed the fact that there is a high risk of systemic spread when there is local recurrence and she understands that in general optimal breast cancer treatment requires both treatment of the  breast and treatment of the whole body (local and systemic therapy).  For local treatment she is strongly considering bilateral mastectomies and is beginning to read up on reconstruction options.  She will discuss this further with Dr.  Wakefield.  For systemic treatment since the cancer was triple negative, the only option is chemotherapy.  Previously she received the 3 most active agents.  She was only able to tolerate a small fraction of the paclitaxel dose because of neuropathy.  This is also limiting our choices at present.  We are going to go with carboplatin, which can be unusually effective in triple negative cases, coupled with gemcitabine, both given on day 1 and 8 of each 21-day cycle.  We will repeat this for 6 cycles.  Generally this combination does not require Neulasta support but we will provide it if needed.  She will need a port and likely we can get that done before the target first day of treatment which will be 11/26/2018.  Aikam has been postponing retirement.  Her husband has been urging her to retire.  If she wishes to continue working, she would be treated on Thursday and she would be back to work on Monday. This actually fits very well with her work schedule.  If she does decide to go on disability we will be glad to help her fill out the appropriate documents.  She knows to call for any other issues that may develop before the next visit.   , Virgie Dad, MD  11/13/18 5:35 PM Medical Oncology and Hematology [facility] [address] [place],  [address] Tel. [phone]    Fax. [phone]  I, Jacqualyn Posey am acting as a Education administrator for Chauncey Cruel, MD.   I, Lurline Del MD, have reviewed the above documentation for accuracy and completeness, and I agree with the above.

## 2018-11-13 ENCOUNTER — Other Ambulatory Visit: Payer: Self-pay | Admitting: General Surgery

## 2018-11-13 ENCOUNTER — Inpatient Hospital Stay: Payer: 59 | Attending: Oncology

## 2018-11-13 ENCOUNTER — Inpatient Hospital Stay (HOSPITAL_BASED_OUTPATIENT_CLINIC_OR_DEPARTMENT_OTHER): Payer: 59 | Admitting: Oncology

## 2018-11-13 ENCOUNTER — Encounter (HOSPITAL_COMMUNITY)
Admission: RE | Admit: 2018-11-13 | Discharge: 2018-11-13 | Disposition: A | Discharge status: Home / Self Care | Payer: 59 | Source: Ambulatory Visit | Attending: General Surgery | Admitting: General Surgery

## 2018-11-13 VITALS — BP 145/77 | HR 63 | Temp 98.2°F | Resp 18 | Ht 65.0 in | Wt 187.3 lb

## 2018-11-13 DIAGNOSIS — Z79899 Other long term (current) drug therapy: Secondary | ICD-10-CM | POA: Insufficient documentation

## 2018-11-13 DIAGNOSIS — Z923 Personal history of irradiation: Secondary | ICD-10-CM | POA: Diagnosis not present

## 2018-11-13 DIAGNOSIS — Z5111 Encounter for antineoplastic chemotherapy: Secondary | ICD-10-CM | POA: Insufficient documentation

## 2018-11-13 DIAGNOSIS — Z9013 Acquired absence of bilateral breasts and nipples: Secondary | ICD-10-CM

## 2018-11-13 DIAGNOSIS — Z171 Estrogen receptor negative status [ER-]: Secondary | ICD-10-CM

## 2018-11-13 DIAGNOSIS — C50912 Malignant neoplasm of unspecified site of left female breast: Secondary | ICD-10-CM

## 2018-11-13 DIAGNOSIS — C50212 Malignant neoplasm of upper-inner quadrant of left female breast: Secondary | ICD-10-CM

## 2018-11-13 DIAGNOSIS — G620 Drug-induced polyneuropathy: Secondary | ICD-10-CM | POA: Insufficient documentation

## 2018-11-13 DIAGNOSIS — T451X5A Adverse effect of antineoplastic and immunosuppressive drugs, initial encounter: Secondary | ICD-10-CM

## 2018-11-13 LAB — CBC WITH DIFFERENTIAL/PLATELET
Abs Immature Granulocytes: 0.03 10*3/uL (ref 0.00–0.07)
Basophils Absolute: 0 10*3/uL (ref 0.0–0.1)
Basophils Relative: 1 %
Eosinophils Absolute: 0.2 10*3/uL (ref 0.0–0.5)
Eosinophils Relative: 3 %
HCT: 41.1 % (ref 36.0–46.0)
Hemoglobin: 13.8 g/dL (ref 12.0–15.0)
Immature Granulocytes: 0 %
Lymphocytes Relative: 24 %
Lymphs Abs: 1.7 10*3/uL (ref 0.7–4.0)
MCH: 30.8 pg (ref 26.0–34.0)
MCHC: 33.6 g/dL (ref 30.0–36.0)
MCV: 91.7 fL (ref 80.0–100.0)
Monocytes Absolute: 0.7 10*3/uL (ref 0.1–1.0)
Monocytes Relative: 10 %
NEUTROS PCT: 62 %
Neutro Abs: 4.6 10*3/uL (ref 1.7–7.7)
Platelets: 299 10*3/uL (ref 150–400)
RBC: 4.48 MIL/uL (ref 3.87–5.11)
RDW: 12.4 % (ref 11.5–15.5)
WBC: 7.3 10*3/uL (ref 4.0–10.5)
nRBC: 0 % (ref 0.0–0.2)

## 2018-11-13 LAB — COMPREHENSIVE METABOLIC PANEL
ALT: 50 U/L — ABNORMAL HIGH (ref 0–44)
AST: 33 U/L (ref 15–41)
Albumin: 4.4 g/dL (ref 3.5–5.0)
Alkaline Phosphatase: 70 U/L (ref 38–126)
Anion gap: 13 (ref 5–15)
BUN: 12 mg/dL (ref 6–20)
CHLORIDE: 104 mmol/L (ref 98–111)
CO2: 23 mmol/L (ref 22–32)
Calcium: 9.9 mg/dL (ref 8.9–10.3)
Creatinine, Ser: 0.76 mg/dL (ref 0.44–1.00)
GFR calc Af Amer: >60 mL/min (ref >60)
GFR calc non Af Amer: >60 mL/min (ref >60)
Glucose, Bld: 91 mg/dL (ref 70–99)
Potassium: 3.4 mmol/L — ABNORMAL LOW (ref 3.5–5.1)
Sodium: 140 mmol/L (ref 135–145)
Total Bilirubin: 0.3 mg/dL (ref 0.3–1.2)
Total Protein: 7.7 g/dL (ref 6.5–8.1)

## 2018-11-13 MED ORDER — PROCHLORPERAZINE MALEATE 10 MG PO TABS: 10.0000 mg | ORAL_TABLET | Freq: Four times a day (QID) | ORAL | 1 refills | Status: DC | PRN

## 2018-11-13 MED ORDER — TECHNETIUM TC 99M MEDRONATE IV KIT
21.6000 | PACK | Freq: Once | INTRAVENOUS | Status: AC | PRN
  Administered 2018-11-13: 21.6 via INTRAVENOUS

## 2018-11-13 MED ORDER — DEXAMETHASONE 4 MG PO TABS: 8.0000 mg | ORAL_TABLET | Freq: Every day | ORAL | 1 refills | Status: DC

## 2018-11-13 MED ORDER — LIDOCAINE-PRILOCAINE 2.5-2.5 % EX CREA: TOPICAL_CREAM | CUTANEOUS | 3 refills | Status: DC

## 2018-11-13 NOTE — Progress Notes (Signed)
Patient on plan of care prior to pathways. 

## 2018-11-13 NOTE — Progress Notes (Signed)
START OFF PATHWAY REGIMEN - Breast   [id number]:Carboplatin AUC=4 D1 + Gemcitabine 1,000 mg/m2 D1, 8 q21 Days:   A cycle is every 21 days:     Carboplatin      Gemcitabine   **Always confirm dose/schedule in your pharmacy ordering system**  Patient Characteristics: Distant Metastases or Locoregional Recurrent Disease - Unresected or Locally Advanced Unresectable Disease Progressing after Neoadjuvant and Local Therapies, HER2 Negative/Unknown/Equivocal, ER Negative/Unknown, Chemotherapy, First Line, ER Negative,  PD-L1 Expression Negative/Unknown Therapeutic Status: Locoregional Recurrent Disease - Unresected BRCA Mutation Status: Absent ER Status: Negative (-) HER2 Status: Negative (-) PR Status: Negative (-) Line of Therapy: First Line PD-L1 Expression Status: Did Not Order Test Intent of Therapy: Curative Intent, Discussed with Patient

## 2018-11-14 LAB — CANCER ANTIGEN 27.29: CA 27.29: 18.6 U/mL (ref 0.0–38.6)

## 2018-11-16 ENCOUNTER — Telehealth: Payer: Self-pay | Admitting: Oncology

## 2018-11-16 ENCOUNTER — Other Ambulatory Visit: Payer: Self-pay | Admitting: *Deleted

## 2018-11-16 ENCOUNTER — Encounter (HOSPITAL_COMMUNITY): Payer: Self-pay

## 2018-11-16 DIAGNOSIS — C50912 Malignant neoplasm of unspecified site of left female breast: Secondary | ICD-10-CM

## 2018-11-16 NOTE — Telephone Encounter (Signed)
No los °

## 2018-11-16 NOTE — Telephone Encounter (Signed)
Scheduled appt per 2/14 sch message - left message for patient with appt date and time

## 2018-11-17 ENCOUNTER — Telehealth: Payer: Self-pay | Admitting: Oncology

## 2018-11-17 NOTE — Telephone Encounter (Signed)
FAXED RECORDS TO [facility] RELEASE ID [id number]

## 2018-11-18 ENCOUNTER — Ambulatory Visit (HOSPITAL_COMMUNITY): Payer: No Typology Code available for payment source

## 2018-11-18 ENCOUNTER — Ambulatory Visit (HOSPITAL_COMMUNITY): Payer: No Typology Code available for payment source | Admitting: Anesthesiology

## 2018-11-18 ENCOUNTER — Encounter (HOSPITAL_COMMUNITY)
Admission: RE | Disposition: A | Discharge status: Home / Self Care | Payer: Self-pay | Source: Home / Self Care | Attending: General Surgery

## 2018-11-18 ENCOUNTER — Encounter (HOSPITAL_COMMUNITY): Payer: Self-pay | Admitting: *Deleted

## 2018-11-18 ENCOUNTER — Ambulatory Visit (HOSPITAL_COMMUNITY)
Admission: RE | Admit: 2018-11-18 | Discharge: 2018-11-18 | Disposition: A | Discharge status: Home / Self Care | Payer: No Typology Code available for payment source | Attending: General Surgery | Admitting: General Surgery

## 2018-11-18 DIAGNOSIS — I10 Essential (primary) hypertension: Secondary | ICD-10-CM | POA: Insufficient documentation

## 2018-11-18 DIAGNOSIS — Z923 Personal history of irradiation: Secondary | ICD-10-CM | POA: Insufficient documentation

## 2018-11-18 DIAGNOSIS — Z87891 Personal history of nicotine dependence: Secondary | ICD-10-CM | POA: Insufficient documentation

## 2018-11-18 DIAGNOSIS — C50912 Malignant neoplasm of unspecified site of left female breast: Secondary | ICD-10-CM | POA: Insufficient documentation

## 2018-11-18 DIAGNOSIS — Z95828 Presence of other vascular implants and grafts: Secondary | ICD-10-CM

## 2018-11-18 DIAGNOSIS — Z9221 Personal history of antineoplastic chemotherapy: Secondary | ICD-10-CM | POA: Diagnosis not present

## 2018-11-18 HISTORY — DX: Personal history of urinary calculi: Z87.442

## 2018-11-18 HISTORY — DX: Fatty (change of) liver, not elsewhere classified: K76.0

## 2018-11-18 HISTORY — PX: PORTACATH PLACEMENT: SHX2246

## 2018-11-18 HISTORY — DX: Dyspnea, unspecified: R06.00

## 2018-11-18 HISTORY — DX: Prediabetes: R73.03

## 2018-11-18 SURGERY — INSERTION, TUNNELED CENTRAL VENOUS DEVICE, WITH PORT
Anesthesia: General | Site: Chest

## 2018-11-18 MED ORDER — LACTATED RINGERS IV SOLN
INTRAVENOUS | Status: DC
  Administered 2018-11-18 (×3): via INTRAVENOUS

## 2018-11-18 MED ORDER — ONDANSETRON HCL 4 MG/2ML IJ SOLN: 4.0000 mg | Freq: Once | INTRAMUSCULAR | Status: DC | PRN

## 2018-11-18 MED ORDER — FENTANYL CITRATE (PF) 100 MCG/2ML IJ SOLN
INTRAMUSCULAR | Status: DC | PRN
  Administered 2018-11-18: 25 ug via INTRAVENOUS
  Administered 2018-11-18: 100 ug via INTRAVENOUS

## 2018-11-18 MED ORDER — PROPOFOL 10 MG/ML IV BOLUS
INTRAVENOUS | Status: DC | PRN
  Administered 2018-11-18: 120 mg via INTRAVENOUS

## 2018-11-18 MED ORDER — MEPERIDINE HCL 50 MG/ML IJ SOLN: 6.2500 mg | INTRAMUSCULAR | Status: DC | PRN

## 2018-11-18 MED ORDER — CEFAZOLIN SODIUM-DEXTROSE 2-4 GM/100ML-% IV SOLN
INTRAVENOUS | Status: AC
  Filled 2018-11-18: qty 100

## 2018-11-18 MED ORDER — OXYCODONE HCL 5 MG/5ML PO SOLN: 5.0000 mg | Freq: Once | ORAL | Status: DC | PRN

## 2018-11-18 MED ORDER — FENTANYL CITRATE (PF) 250 MCG/5ML IJ SOLN
INTRAMUSCULAR | Status: AC
  Filled 2018-11-18: qty 5

## 2018-11-18 MED ORDER — ACETAMINOPHEN 160 MG/5ML PO SOLN: 325.0000 mg | ORAL | Status: DC | PRN

## 2018-11-18 MED ORDER — BUPIVACAINE HCL (PF) 0.25 % IJ SOLN
INTRAMUSCULAR | Status: AC
  Filled 2018-11-18: qty 30

## 2018-11-18 MED ORDER — HEPARIN SOD (PORK) LOCK FLUSH 100 UNIT/ML IV SOLN
INTRAVENOUS | Status: DC | PRN
  Administered 2018-11-18: 500 [IU]

## 2018-11-18 MED ORDER — CEFAZOLIN SODIUM-DEXTROSE 2-4 GM/100ML-% IV SOLN
2.0000 g | INTRAVENOUS | Status: AC
  Administered 2018-11-18: 2 g via INTRAVENOUS

## 2018-11-18 MED ORDER — SODIUM CHLORIDE 0.9 % IV SOLN
INTRAVENOUS | Status: AC
  Filled 2018-11-18: qty 1.2

## 2018-11-18 MED ORDER — SODIUM CHLORIDE 0.9 % IV SOLN
INTRAVENOUS | Status: DC | PRN
  Administered 2018-11-18: 500 mL

## 2018-11-18 MED ORDER — MIDAZOLAM HCL 2 MG/2ML IJ SOLN
INTRAMUSCULAR | Status: AC
  Filled 2018-11-18: qty 2

## 2018-11-18 MED ORDER — HEPARIN SOD (PORK) LOCK FLUSH 100 UNIT/ML IV SOLN
INTRAVENOUS | Status: AC
  Filled 2018-11-18: qty 5

## 2018-11-18 MED ORDER — 0.9 % SODIUM CHLORIDE (POUR BTL) OPTIME
TOPICAL | Status: DC | PRN
  Administered 2018-11-18: 1000 mL

## 2018-11-18 MED ORDER — ACETAMINOPHEN 325 MG PO TABS: 325.0000 mg | ORAL_TABLET | ORAL | Status: DC | PRN

## 2018-11-18 MED ORDER — GABAPENTIN 100 MG PO CAPS
ORAL_CAPSULE | ORAL | Status: AC
  Administered 2018-11-18: 100 mg via ORAL
  Filled 2018-11-18: qty 1

## 2018-11-18 MED ORDER — ENSURE PRE-SURGERY PO LIQD: 296.0000 mL | Freq: Once | ORAL | Status: DC

## 2018-11-18 MED ORDER — LIDOCAINE 2% (20 MG/ML) 5 ML SYRINGE
INTRAMUSCULAR | Status: DC | PRN
  Administered 2018-11-18: 100 mg via INTRAVENOUS

## 2018-11-18 MED ORDER — ACETAMINOPHEN 500 MG PO TABS
1000.0000 mg | ORAL_TABLET | ORAL | Status: AC
  Administered 2018-11-18: 1000 mg via ORAL

## 2018-11-18 MED ORDER — ACETAMINOPHEN 500 MG PO TABS
ORAL_TABLET | ORAL | Status: AC
  Administered 2018-11-18: 1000 mg via ORAL
  Filled 2018-11-18: qty 2

## 2018-11-18 MED ORDER — ONDANSETRON HCL 4 MG/2ML IJ SOLN
INTRAMUSCULAR | Status: DC | PRN
  Administered 2018-11-18: 4 mg via INTRAVENOUS

## 2018-11-18 MED ORDER — MIDAZOLAM HCL 5 MG/5ML IJ SOLN
INTRAMUSCULAR | Status: DC | PRN
  Administered 2018-11-18: 2 mg via INTRAVENOUS

## 2018-11-18 MED ORDER — GABAPENTIN 100 MG PO CAPS
100.0000 mg | ORAL_CAPSULE | ORAL | Status: AC
  Administered 2018-11-18: 100 mg via ORAL

## 2018-11-18 MED ORDER — BUPIVACAINE HCL (PF) 0.25 % IJ SOLN
INTRAMUSCULAR | Status: DC | PRN
  Administered 2018-11-18: 10 mL

## 2018-11-18 MED ORDER — OXYCODONE HCL 5 MG PO TABS: 5.0000 mg | ORAL_TABLET | Freq: Once | ORAL | Status: DC | PRN

## 2018-11-18 MED ORDER — FENTANYL CITRATE (PF) 100 MCG/2ML IJ SOLN: 25.0000 ug | INTRAMUSCULAR | Status: DC | PRN

## 2018-11-18 MED ORDER — PROPOFOL 10 MG/ML IV BOLUS
INTRAVENOUS | Status: AC
  Filled 2018-11-18: qty 20

## 2018-11-18 SURGICAL SUPPLY — 46 items
ADH SKN CLS APL DERMABOND .7 (GAUZE/BANDAGES/DRESSINGS) ×1
BAG DECANTER FOR FLEXI CONT (MISCELLANEOUS) ×3 IMPLANT
CHLORAPREP W/TINT 10.5 ML (MISCELLANEOUS) ×3 IMPLANT
CLOSURE WOUND 1/2 X4 (GAUZE/BANDAGES/DRESSINGS) ×1
COVER SURGICAL LIGHT HANDLE (MISCELLANEOUS) ×3 IMPLANT
COVER TRANSDUCER ULTRASND GEL (DRAPE) ×3 IMPLANT
COVER WAND RF STERILE (DRAPES) ×3 IMPLANT
CRADLE DONUT ADULT HEAD (MISCELLANEOUS) ×3 IMPLANT
DECANTER SPIKE VIAL GLASS SM (MISCELLANEOUS) ×3 IMPLANT
DERMABOND ADVANCED (GAUZE/BANDAGES/DRESSINGS) ×2
DERMABOND ADVANCED .7 DNX12 (GAUZE/BANDAGES/DRESSINGS) ×1 IMPLANT
DRAPE C-ARM 42X72 X-RAY (DRAPES) ×3 IMPLANT
DRAPE CHEST BREAST 15X10 FENES (DRAPES) ×3 IMPLANT
ELECT CAUTERY BLADE 6.4 (BLADE) ×3 IMPLANT
ELECT REM PT RETURN 9FT ADLT (ELECTROSURGICAL) ×3
ELECTRODE REM PT RTRN 9FT ADLT (ELECTROSURGICAL) ×1 IMPLANT
GAUZE 4X4 16PLY RFD (DISPOSABLE) ×3 IMPLANT
GEL ULTRASOUND 20GR AQUASONIC (MISCELLANEOUS) ×3 IMPLANT
GLOVE BIO SURGEON STRL SZ7 (GLOVE) ×5 IMPLANT
GLOVE BIOGEL PI IND STRL 7.5 (GLOVE) ×1 IMPLANT
GLOVE BIOGEL PI INDICATOR 7.5 (GLOVE) ×2
GOWN STRL REUS W/ TWL LRG LVL3 (GOWN DISPOSABLE) ×2 IMPLANT
GOWN STRL REUS W/TWL LRG LVL3 (GOWN DISPOSABLE) ×6
INTRODUCER COOK 11FR (CATHETERS) IMPLANT
KIT BASIN OR (CUSTOM PROCEDURE TRAY) ×3 IMPLANT
KIT PORT POWER 8FR ISP CVUE (Port) ×3 IMPLANT
KIT TURNOVER KIT B (KITS) ×3 IMPLANT
NS IRRIG 1000ML POUR BTL (IV SOLUTION) ×3 IMPLANT
PAD ARMBOARD 7.5X6 YLW CONV (MISCELLANEOUS) ×6 IMPLANT
PENCIL BUTTON HOLSTER BLD 10FT (ELECTRODE) ×3 IMPLANT
SET INTRODUCER 12FR PACEMAKER (INTRODUCER) IMPLANT
SET SHEATH INTRODUCER 10FR (MISCELLANEOUS) IMPLANT
SHEATH COOK PEEL AWAY SET 9F (SHEATH) IMPLANT
STAPLER VISISTAT 35W (STAPLE) ×2 IMPLANT
STRIP CLOSURE SKIN 1/2X4 (GAUZE/BANDAGES/DRESSINGS) ×1 IMPLANT
SUT MNCRL AB 4-0 PS2 18 (SUTURE) ×5 IMPLANT
SUT PROLENE 2 0 SH DA (SUTURE) ×5 IMPLANT
SUT SILK 2 0 (SUTURE)
SUT SILK 2-0 18XBRD TIE 12 (SUTURE) IMPLANT
SUT VIC AB 3-0 SH 27 (SUTURE) ×6
SUT VIC AB 3-0 SH 27X BRD (SUTURE) IMPLANT
SUT VIC AB 3-0 SH 27XBRD (SUTURE) ×1 IMPLANT
SYR 5ML LUER SLIP (SYRINGE) ×3 IMPLANT
TOWEL OR 17X24 6PK STRL BLUE (TOWEL DISPOSABLE) ×3 IMPLANT
TOWEL OR 17X26 10 PK STRL BLUE (TOWEL DISPOSABLE) ×3 IMPLANT
TRAY LAPAROSCOPIC MC (CUSTOM PROCEDURE TRAY) ×3 IMPLANT

## 2018-11-18 NOTE — Interval H&P Note (Signed)
History and Physical Interval Note:  11/18/2018 2:58 PM  Alexandria Bradley  has presented today for surgery, with the diagnosis of BREAST CANCER  The various methods of treatment have been discussed with the patient and family. After consideration of risks, benefits and other options for treatment, the patient has consented to  Procedure(s): INSERTION PORT-A-CATH WITH ULTRASOUND (N/A) as a surgical intervention .  The patient's history has been reviewed, patient examined, no change in status, stable for surgery.  I have reviewed the patient's chart and labs.  Questions were answered to the patient's satisfaction.     [name]

## 2018-11-18 NOTE — Transfer of Care (Signed)
Immediate Anesthesia Transfer of Care Note  Patient: Alexandria Bradley  Procedure(s) Performed: INSERTION PORT-A-CATH WITH ULTRASOUND (N/A Chest)  Patient Location: PACU  Anesthesia Type:General  Level of Consciousness: awake and alert   Airway & Oxygen Therapy: Patient Spontanous Breathing and Patient connected to nasal cannula oxygen  Post-op Assessment: Report given to RN, Post -op Vital signs reviewed and stable and Patient moving all extremities  Post vital signs: Reviewed and stable  Last Vitals:  Vitals Value Taken Time  BP 136/74 11/18/2018  4:30 PM  Temp 36.2 C 11/18/2018  4:30 PM  Pulse 69 11/18/2018  4:32 PM  Resp 15 11/18/2018  4:32 PM  SpO2 97 % 11/18/2018  4:32 PM  Vitals shown include unvalidated device data.  Last Pain:  Vitals:   11/18/18 1258  TempSrc: Oral         Complications: No apparent anesthesia complications

## 2018-11-18 NOTE — Anesthesia Procedure Notes (Signed)
Procedure Name: LMA Insertion Date/Time: 11/18/2018 3:38 PM Performed by: Tawanda Schall T, CRNA Pre-anesthesia Checklist: Patient identified, Emergency Drugs available, Suction available and Patient being monitored Patient Re-evaluated:Patient Re-evaluated prior to induction Oxygen Delivery Method: Circle system utilized Preoxygenation: Pre-oxygenation with 100% oxygen Induction Type: IV induction Ventilation: Mask ventilation without difficulty LMA: LMA inserted LMA Size: 5.0 Number of attempts: 1 Airway Equipment and Method: Patient positioned with wedge pillow Placement Confirmation: positive ETCO2 and breath sounds checked- equal and bilateral Tube secured with: Tape Dental Injury: Teeth and Oropharynx as per pre-operative assessment

## 2018-11-18 NOTE — Discharge Instructions (Signed)
    PORT-A-CATH: POST OP INSTRUCTIONS  Always review your discharge instruction sheet given to you by the facility where your surgery was performed.   1. A prescription for pain medication may be given to you upon discharge. Take your pain medication as prescribed, if needed. If narcotic pain medicine is not needed, then you make take acetaminophen (Tylenol) or ibuprofen (Advil) as needed.  2. Take your usually prescribed medications unless otherwise directed. 3. If you need a refill on your pain medication, please contact our office. All narcotic pain medicine now requires a paper prescription.  Phoned in and fax refills are no longer allowed by law.  Prescriptions will not be filled after 5 pm or on weekends.  4. You should follow a light diet for the remainder of the day after your procedure. 5. Most patients will experience some mild swelling and/or bruising in the area of the incision. It may take several days to resolve. 6. It is common to experience some constipation if taking pain medication after surgery. Increasing fluid intake and taking a stool softener (such as Colace) will usually help or prevent this problem from occurring. A mild laxative (Milk of Magnesia or Miralax) should be taken according to package directions if there are no bowel movements after 48 hours.  7. Unless discharge instructions indicate otherwise, you may remove your bandages 48 hours after surgery, and you may shower at that time. You may have steri-strips (small white skin tapes) in place directly over the incision.  These strips should be left on the skin for 7-10 days.  If your surgeon used Dermabond (skin glue) on the incision, you may shower in 24 hours.  The glue will flake off over the next 2-3 weeks.  8. If your port is left accessed at the end of surgery (needle left in port), the dressing cannot get wet and should only by changed by a healthcare professional. When the port is no longer accessed (when the  needle has been removed), follow step 7.   9. ACTIVITIES:  Limit activity involving your arms for the next 72 hours. Do no strenuous exercise or activity for 1 week. You may drive when you are no longer taking prescription pain medication, you can comfortably wear a seatbelt, and you can maneuver your car. 10.You may need to see your doctor in the office for a follow-up appointment.  Please       check with your doctor.  11.When you receive a new Port-a-Cath, you will get a product guide and        ID card.  Please keep them in case you need them.  WHEN TO CALL YOUR DOCTOR ([phone]): 1. Fever over 101.0 2. Chills 3. Continued bleeding from incision 4. Increased redness and tenderness at the site 5. Shortness of breath, difficulty breathing   The clinic staff is available to answer your questions during regular business hours. Please don't hesitate to call and ask to speak to one of the nurses or medical assistants for clinical concerns. If you have a medical emergency, go to the nearest emergency room or call 911.  A surgeon from [facility] is always on call at the hospital.     For further information, please visit [email or web address]      

## 2018-11-18 NOTE — Anesthesia Preprocedure Evaluation (Addendum)
Anesthesia Evaluation  Patient identified by MRN, date of birth, ID band Patient awake    Reviewed: Allergy & Precautions, NPO status , Patient's Chart, lab work & pertinent test results  History of Anesthesia Complications (+) PONV  Airway Mallampati: I  TM Distance: >3 FB Neck ROM: Full    Dental  (+) Teeth Intact, Dental Advisory Given   Pulmonary former smoker,    Pulmonary exam normal        Cardiovascular hypertension, Pt. on medications Normal cardiovascular exam     Neuro/Psych Anxiety    GI/Hepatic   Endo/Other    Renal/GU      Musculoskeletal   Abdominal   Peds  Hematology   Anesthesia Other Findings   Reproductive/Obstetrics                            Anesthesia Physical  Anesthesia Plan  ASA: III  Anesthesia Plan: General   Post-op Pain Management:    Induction: Intravenous  PONV Risk Score and Plan: 4 or greater and Ondansetron, Dexamethasone, Treatment may vary due to age or medical condition, Midazolam and Scopolamine patch - Pre-op  Airway Management Planned: LMA  Additional Equipment:   Intra-op Plan:   Post-operative Plan: Extubation in OR  Informed Consent: I have reviewed the patients History and Physical, chart, labs and discussed the procedure including the risks, benefits and alternatives for the proposed anesthesia with the patient or authorized representative who has indicated his/her understanding and acceptance.       Plan Discussed with: CRNA, Surgeon and Anesthesiologist  Anesthesia Plan Comments:         Anesthesia Quick Evaluation

## 2018-11-18 NOTE — H&P (Signed)
Alexandria Bradley is an 58 y.o. female.   Chief Complaint: recurrent left breast cancer HPI:57 yof underwent left lump/sn for stage I tnbc followed by adjuvant chemo/radiotherapy. completed in january of 2015. she has mm in april 2019 that is normal she does not have any nipple dc. she has full function of lue and has no symptoms of lymphedema. she remains active. she has noted a left breast mass near her scar that has been noted for a few weeks. I sent her for mm and [place]. mm shows increased density in region of scar. US shows a mass near scar measuring 14 mm in size. there is no axillary adenopathy. core biopsy was performed and this is grade 1-2 idc, triple negative. she returns today with her husband to discuss options    Past Medical History:  Diagnosis Date  . Anxiety   . Arthritis   . Back pain   . Breast cancer ([place])    left  . Dyspnea   . Fatty liver   . GERD (gastroesophageal reflux disease)   . Headache(784.0)    PMH : migraines  . History of kidney stones   . Hypercholesterolemia   . Hypertension   . Hypothyroidism   . Kidney stones   . Osteoporosis   . Personal history of chemotherapy   . Personal history of radiation therapy   . PONV (postoperative nausea and vomiting)    difficult to wake up  . Pre-diabetes   . Psoriasis   . PVC's (premature ventricular contractions)   . Radiation 08/18/14-10/07/14   Left breast    Past Surgical History:  Procedure Laterality Date  . BREAST LUMPECTOMY WITH AXILLARY LYMPH NODE BIOPSY  6/15   left  . CARDIAC CATHETERIZATION    . CHOLECYSTECTOMY    . COLONOSCOPY N/A 06/27/2014   Procedure: COLONOSCOPY;  Surgeon: Lear Ng, MD;  Location: [facility];  Service: Endoscopy;  Laterality: N/A;  . LEFT HEART CATH AND CORONARY ANGIOGRAPHY N/A 07/24/2018   Procedure: LEFT HEART CATH AND CORONARY ANGIOGRAPHY;  Surgeon: Troy Sine, MD;  Location: [place] [facility];  Service: Cardiovascular;  Laterality: N/A;  .  LIPOMA EXCISION    . MYOMECTOMY    . PORT-A-CATH REMOVAL N/A 02/03/2015   Procedure: REMOVAL PORT-A-CATH;  Surgeon: [name], MD;  Location: [place];  Service: General;  Laterality: N/A;  . PORTACATH PLACEMENT N/A 03/01/2014   Procedure: INSERTION PORT-A-CATH;  Surgeon: [name], MD;  Location: [place];  Service: General;  Laterality: N/A;  . TONSILLECTOMY    . TUBAL LIGATION      Family History  Problem Relation Age of Onset  . Endometrial cancer Mother 10  . Hearing loss Mother   . Atrial fibrillation Mother   . Lung cancer Father   . Brain cancer Father   . Heart disease Maternal Aunt   . Atrial fibrillation Maternal Aunt   . Lung cancer Maternal Uncle   . Atrial fibrillation Maternal Uncle   . Stroke Maternal Grandmother   . Stroke Maternal Grandfather   . Bladder Cancer Maternal Uncle   . Multiple myeloma Maternal Uncle   . Other Son        trisomy 82   Social History:  reports that she has quit smoking. Her smoking use included cigarettes. She has a 7.50 pack-year smoking history. She has never used smokeless tobacco. She reports current alcohol use. She reports that she does not use drugs.  Allergies:  Allergies  Allergen Reactions  . Chlorhexidine Itching  . Ciprofloxacin Anxiety, Rash and Other (See Comments)    GI upset  . Prednisone Shortness Of Breath and Other (See Comments)    "jacks me up" jittery   . Codeine Nausea Only  . Cortisone Swelling and Other (See Comments)    "jack me up"     No medications prior to admission.    No results found for this or any previous visit (from the past 48 hour(s)). No results found.  Review of Systems  All other systems reviewed and are negative.   There were no vitals taken for this visit. Physical Exam  Constitutional: She appears well-developed and well-nourished.  Cardiovascular: Normal rate and regular rhythm.  Respiratory: Effort normal and breath sounds  normal.  Lymphadenopathy:    She has no cervical adenopathy.   left breast mass at edge of scar, no ax adenopathy  Assessment/Plan RECURRENT BREAST CANCER, LEFT (C50.912) Port placement We discussed recurrent idc s/p lumpectomy/radiotherapy. will await prognostic panel with recurrence of tnbc will send her for staging scans and she will see Dr Jana Hakim this week as well. if this is recurrent triple negative disease reasonable to consider primary systemic therapy first. she will need mastectomy and attempt repeat sn biopsy on the left. she desires bilateral mastectomy now as well. she does desire reconstruction at same time as well. she had genetics and was negative previously. Proceed with port placement and primary chemotherapy.   [name], MD 11/18/2018, 7:53 AM

## 2018-11-18 NOTE — Op Note (Signed)
Preoperative diagnosis:breast cancer, clinical stage I, recurrent Postoperative diagnosis: same as above Procedure: right ij US guided powerport insertion Surgeon: Dr Serita Grammes EBL: minimal Anes: general  Specimensnone Complications none Drains none Sponge count correct Dispo to pacu stable  Indications: This is a 51 yof with recurrent left breast tn breast cancer We discussed all options and elected to proceed with systemic therapy.She is due to begin chemotherapy. We discussed port placement.  Procedure: After informed consent was obtained the patient was taken to the operating room. She was given antibiotics. Sequential compression devices were on her legs. She was then placed under general anesthesia. Then she was prepped and draped in the standard sterile surgical fashion. Surgical timeout was then performed.  Ithenused the ultrasound to identify the right internal jugular vein. I then accessed the vein using the ultrasound.This aspirated blood. I then placed the wire. This was confirmed by fluoroscopy and ultrasound to be in the correct position.I then infiltrated marcaine and reentered the old port incision. I created a pocket. I tunneled the line between the 2 sites.I then dilated the tract and placed the dilator assembly with the sheath. This was done under fluoroscopy. I then removed the sheath and dilator. The wire was also removed. The line was then pulled back to be in the venacava. I hooked this up to the port. I sutured this into place with 2-0 Prolene in 2 places. This aspirated blood and flushed easily.This was confirmed with a final fluoroscopy. I then closed this with 2-0 Vicryl and 4-0 Monocryl.This withdrew blood and I placed heparin in it.Dermabond was placed on both the incisions.She tolerated this well and was transferred to the recovery room in stable condition

## 2018-11-19 ENCOUNTER — Inpatient Hospital Stay: Payer: 59

## 2018-11-19 ENCOUNTER — Encounter: Payer: Self-pay | Admitting: Oncology

## 2018-11-19 ENCOUNTER — Encounter (HOSPITAL_COMMUNITY): Payer: Self-pay | Admitting: General Surgery

## 2018-11-19 DIAGNOSIS — Z171 Estrogen receptor negative status [ER-]: Principal | ICD-10-CM

## 2018-11-19 DIAGNOSIS — C50912 Malignant neoplasm of unspecified site of left female breast: Secondary | ICD-10-CM

## 2018-11-19 DIAGNOSIS — Z5111 Encounter for antineoplastic chemotherapy: Secondary | ICD-10-CM | POA: Diagnosis not present

## 2018-11-19 DIAGNOSIS — G620 Drug-induced polyneuropathy: Secondary | ICD-10-CM

## 2018-11-19 DIAGNOSIS — T451X5A Adverse effect of antineoplastic and immunosuppressive drugs, initial encounter: Secondary | ICD-10-CM

## 2018-11-19 DIAGNOSIS — C50212 Malignant neoplasm of upper-inner quadrant of left female breast: Secondary | ICD-10-CM

## 2018-11-19 LAB — CBC WITH DIFFERENTIAL/PLATELET
Abs Immature Granulocytes: 0.02 10*3/uL (ref 0.00–0.07)
Basophils Absolute: 0 10*3/uL (ref 0.0–0.1)
Basophils Relative: 0 %
Eosinophils Absolute: 0.2 10*3/uL (ref 0.0–0.5)
Eosinophils Relative: 2 %
HCT: 40.8 % (ref 36.0–46.0)
Hemoglobin: 13.3 g/dL (ref 12.0–15.0)
Immature Granulocytes: 0 %
Lymphocytes Relative: 31 %
Lymphs Abs: 2.2 10*3/uL (ref 0.7–4.0)
MCH: 30.6 pg (ref 26.0–34.0)
MCHC: 32.6 g/dL (ref 30.0–36.0)
MCV: 93.8 fL (ref 80.0–100.0)
MONO ABS: 0.5 10*3/uL (ref 0.1–1.0)
Monocytes Relative: 8 %
Neutro Abs: 4.1 10*3/uL (ref 1.7–7.7)
Neutrophils Relative %: 59 %
Platelets: 302 10*3/uL (ref 150–400)
RBC: 4.35 MIL/uL (ref 3.87–5.11)
RDW: 12.4 % (ref 11.5–15.5)
WBC: 7 10*3/uL (ref 4.0–10.5)
nRBC: 0 % (ref 0.0–0.2)

## 2018-11-19 LAB — COMPREHENSIVE METABOLIC PANEL
ALT: 59 U/L — ABNORMAL HIGH (ref 0–44)
AST: 33 U/L (ref 15–41)
Albumin: 4.3 g/dL (ref 3.5–5.0)
Alkaline Phosphatase: 72 U/L (ref 38–126)
Anion gap: 9 (ref 5–15)
BILIRUBIN TOTAL: 0.3 mg/dL (ref 0.3–1.2)
BUN: 9 mg/dL (ref 6–20)
CO2: 28 mmol/L (ref 22–32)
Calcium: 9.5 mg/dL (ref 8.9–10.3)
Chloride: 104 mmol/L (ref 98–111)
Creatinine, Ser: 0.8 mg/dL (ref 0.44–1.00)
GFR calc Af Amer: >60 mL/min (ref >60)
GFR calc non Af Amer: >60 mL/min (ref >60)
Glucose, Bld: 91 mg/dL (ref 70–99)
Potassium: 3.6 mmol/L (ref 3.5–5.1)
Sodium: 141 mmol/L (ref 135–145)
Total Protein: 7.6 g/dL (ref 6.5–8.1)

## 2018-11-19 NOTE — Anesthesia Postprocedure Evaluation (Signed)
Anesthesia Post Note  Patient: Alexandria Bradley  Procedure(s) Performed: INSERTION PORT-A-CATH WITH ULTRASOUND (N/A Chest)     Patient location during evaluation: PACU Anesthesia Type: General Level of consciousness: awake and alert Pain management: pain level controlled Vital Signs Assessment: post-procedure vital signs reviewed and stable Respiratory status: spontaneous breathing, nonlabored ventilation, respiratory function stable and patient connected to nasal cannula oxygen Cardiovascular status: blood pressure returned to baseline and stable Postop Assessment: no apparent nausea or vomiting Anesthetic complications: no    Last Vitals:  Vitals:   11/18/18 1715 11/18/18 1730  BP: 135/69 130/71  Pulse: 64 64  Resp: 16 14  Temp: (!) 36.2 C   SpO2: 97% 99%    Last Pain:  Vitals:   11/18/18 1258  TempSrc: Oral                 Jalaila Caradonna

## 2018-11-19 NOTE — Progress Notes (Signed)
Met with patient to introduce myself as Arboriculturist and to offer available resources. There are currently no foundations with copay assistance for her diagnosis.   Discussed one-time $1000 Radio broadcast assistant to assist with personal expenses while going through treatment. Patient states she is over the income guidelines.  Gave her my card for any additional financial questions or concerns.

## 2018-11-20 ENCOUNTER — Other Ambulatory Visit: Payer: Self-pay | Admitting: General Surgery

## 2018-11-20 ENCOUNTER — Ambulatory Visit (HOSPITAL_COMMUNITY)
Admission: RE | Admit: 2018-11-20 | Discharge: 2018-11-20 | Disposition: A | Discharge status: Home / Self Care | Payer: 59 | Source: Ambulatory Visit | Attending: Oncology | Admitting: Oncology

## 2018-11-20 ENCOUNTER — Telehealth: Payer: Self-pay | Admitting: Adult Health

## 2018-11-20 DIAGNOSIS — Z171 Estrogen receptor negative status [ER-]: Principal | ICD-10-CM

## 2018-11-20 DIAGNOSIS — C50912 Malignant neoplasm of unspecified site of left female breast: Secondary | ICD-10-CM | POA: Insufficient documentation

## 2018-11-20 DIAGNOSIS — C50212 Malignant neoplasm of upper-inner quadrant of left female breast: Secondary | ICD-10-CM

## 2018-11-20 DIAGNOSIS — Z853 Personal history of malignant neoplasm of breast: Secondary | ICD-10-CM

## 2018-11-20 LAB — CANCER ANTIGEN 27.29: CA 27.29: 12.8 U/mL (ref 0.0–38.6)

## 2018-11-20 MED ORDER — GADOBUTROL 1 MMOL/ML IV SOLN
10.0000 mL | Freq: Once | INTRAVENOUS | Status: AC | PRN
  Administered 2018-11-20: 8 mL via INTRAVENOUS

## 2018-11-20 NOTE — Telephone Encounter (Signed)
Spoke with Dr. Arleta Creek about Alexandria Bradley and the MRI of the total spine that was ordered.  She authorized MRI of the T spine only to evaluate for the indeterminate lesions on the bone scan.  Order for MRI thoracic spine w/wo contrast placed.    Authorization for MRI thoracic spine: [id number]  Wilber Bihari, NP

## 2018-11-23 ENCOUNTER — Telehealth: Payer: Self-pay | Admitting: Adult Health

## 2018-11-23 ENCOUNTER — Ambulatory Visit (HOSPITAL_COMMUNITY)
Admission: RE | Admit: 2018-11-23 | Discharge: 2018-11-23 | Disposition: A | Discharge status: Home / Self Care | Payer: 59 | Source: Ambulatory Visit | Attending: Oncology | Admitting: Oncology

## 2018-11-23 DIAGNOSIS — Z171 Estrogen receptor negative status [ER-]: Secondary | ICD-10-CM | POA: Diagnosis present

## 2018-11-23 DIAGNOSIS — C50212 Malignant neoplasm of upper-inner quadrant of left female breast: Secondary | ICD-10-CM | POA: Diagnosis not present

## 2018-11-23 MED ORDER — GADOBUTROL 1 MMOL/ML IV SOLN
8.0000 mL | Freq: Once | INTRAVENOUS | Status: AC | PRN
  Administered 2018-11-23: 8 mL via INTRAVENOUS

## 2018-11-23 NOTE — Telephone Encounter (Signed)
Called and [facility] for patient to return call.  Wanted to review MRI thoracic spine results.  Wilber Bihari, NP

## 2018-11-23 NOTE — Telephone Encounter (Signed)
Called and reviewed MRI T spine results with Alexandria Bradley which show no evidence of cancer.  Alexandria Bradley then asked me to review MRI breast results with her, which I did.  Alexandria Bradley wants to know if she really has to have mammogram tomorrow since MRI breast shows no evidence of malignancy in the right breast.  She also is having pain at her right port insertion site.  I let her know that I would reach out to Dr. Donne Hazel and get his preference since he ordered the study.  I spoke briefly to Dr. Chrissie Noa who notes that she still needs to have the mammogram done, but that it can be delayed by 1-2 weeks due to her right chest wall pain from port insertion.    Called patient to let her know that we can help her reschedule, but did not get answer.    Wilber Bihari, NP

## 2018-11-24 ENCOUNTER — Other Ambulatory Visit: Payer: Self-pay | Admitting: General Surgery

## 2018-11-24 ENCOUNTER — Ambulatory Visit
Admission: RE | Admit: 2018-11-24 | Discharge: 2018-11-24 | Disposition: A | Discharge status: Home / Self Care | Payer: 59 | Source: Ambulatory Visit | Attending: General Surgery | Admitting: General Surgery

## 2018-11-24 DIAGNOSIS — Z853 Personal history of malignant neoplasm of breast: Secondary | ICD-10-CM

## 2018-11-24 DIAGNOSIS — N6489 Other specified disorders of breast: Secondary | ICD-10-CM

## 2018-11-26 ENCOUNTER — Inpatient Hospital Stay: Payer: 59

## 2018-11-26 ENCOUNTER — Telehealth: Payer: Self-pay | Admitting: Oncology

## 2018-11-26 ENCOUNTER — Inpatient Hospital Stay (HOSPITAL_BASED_OUTPATIENT_CLINIC_OR_DEPARTMENT_OTHER): Payer: 59 | Admitting: Adult Health

## 2018-11-26 ENCOUNTER — Encounter: Payer: Self-pay | Admitting: Adult Health

## 2018-11-26 VITALS — BP 143/66 | HR 72 | Temp 97.5°F | Resp 18 | Ht 65.0 in | Wt 189.4 lb

## 2018-11-26 DIAGNOSIS — Z171 Estrogen receptor negative status [ER-]: Secondary | ICD-10-CM

## 2018-11-26 DIAGNOSIS — Z923 Personal history of irradiation: Secondary | ICD-10-CM

## 2018-11-26 DIAGNOSIS — Z9013 Acquired absence of bilateral breasts and nipples: Secondary | ICD-10-CM | POA: Diagnosis not present

## 2018-11-26 DIAGNOSIS — C50212 Malignant neoplasm of upper-inner quadrant of left female breast: Secondary | ICD-10-CM

## 2018-11-26 DIAGNOSIS — Z95828 Presence of other vascular implants and grafts: Secondary | ICD-10-CM | POA: Insufficient documentation

## 2018-11-26 DIAGNOSIS — Z5111 Encounter for antineoplastic chemotherapy: Secondary | ICD-10-CM | POA: Diagnosis not present

## 2018-11-26 DIAGNOSIS — Z79899 Other long term (current) drug therapy: Secondary | ICD-10-CM

## 2018-11-26 DIAGNOSIS — C50912 Malignant neoplasm of unspecified site of left female breast: Secondary | ICD-10-CM

## 2018-11-26 LAB — COMPREHENSIVE METABOLIC PANEL
ALK PHOS: 72 U/L (ref 38–126)
ALT: 43 U/L (ref 0–44)
AST: 28 U/L (ref 15–41)
Albumin: 4.3 g/dL (ref 3.5–5.0)
Anion gap: 12 (ref 5–15)
BUN: 15 mg/dL (ref 6–20)
CO2: 24 mmol/L (ref 22–32)
Calcium: 9.8 mg/dL (ref 8.9–10.3)
Chloride: 105 mmol/L (ref 98–111)
Creatinine, Ser: 0.78 mg/dL (ref 0.44–1.00)
GFR calc Af Amer: >60 mL/min (ref >60)
GFR calc non Af Amer: >60 mL/min (ref >60)
Glucose, Bld: 108 mg/dL — ABNORMAL HIGH (ref 70–99)
Potassium: 3.7 mmol/L (ref 3.5–5.1)
Sodium: 141 mmol/L (ref 135–145)
Total Bilirubin: 0.4 mg/dL (ref 0.3–1.2)
Total Protein: 7.6 g/dL (ref 6.5–8.1)

## 2018-11-26 LAB — CBC WITH DIFFERENTIAL/PLATELET
ABS IMMATURE GRANULOCYTES: 0.06 10*3/uL (ref 0.00–0.07)
Basophils Absolute: 0.1 10*3/uL (ref 0.0–0.1)
Basophils Relative: 1 %
EOS PCT: 4 %
Eosinophils Absolute: 0.4 10*3/uL (ref 0.0–0.5)
HCT: 39.3 % (ref 36.0–46.0)
Hemoglobin: 12.9 g/dL (ref 12.0–15.0)
Immature Granulocytes: 1 %
Lymphocytes Relative: 23 %
Lymphs Abs: 1.9 10*3/uL (ref 0.7–4.0)
MCH: 30.7 pg (ref 26.0–34.0)
MCHC: 32.8 g/dL (ref 30.0–36.0)
MCV: 93.6 fL (ref 80.0–100.0)
MONO ABS: 0.6 10*3/uL (ref 0.1–1.0)
Monocytes Relative: 8 %
Neutro Abs: 5 10*3/uL (ref 1.7–7.7)
Neutrophils Relative %: 63 %
PLATELETS: 315 10*3/uL (ref 150–400)
RBC: 4.2 MIL/uL (ref 3.87–5.11)
RDW: 12.7 % (ref 11.5–15.5)
WBC: 8 10*3/uL (ref 4.0–10.5)
nRBC: 0 % (ref 0.0–0.2)

## 2018-11-26 MED ORDER — HEPARIN SOD (PORK) LOCK FLUSH 100 UNIT/ML IV SOLN
500.0000 [IU] | Freq: Once | INTRAVENOUS | Status: AC | PRN
  Administered 2018-11-26: 500 [IU]
  Filled 2018-11-26: qty 5

## 2018-11-26 MED ORDER — SODIUM CHLORIDE 0.9 % IV SOLN
Freq: Once | INTRAVENOUS | Status: AC
  Administered 2018-11-26: 14:00:00 via INTRAVENOUS
  Filled 2018-11-26: qty 250

## 2018-11-26 MED ORDER — DEXAMETHASONE SODIUM PHOSPHATE 10 MG/ML IJ SOLN
10.0000 mg | Freq: Once | INTRAMUSCULAR | Status: AC
  Administered 2018-11-26: 10 mg via INTRAVENOUS

## 2018-11-26 MED ORDER — PALONOSETRON HCL INJECTION 0.25 MG/5ML
0.2500 mg | Freq: Once | INTRAVENOUS | Status: AC
  Administered 2018-11-26: 0.25 mg via INTRAVENOUS

## 2018-11-26 MED ORDER — SODIUM CHLORIDE 0.9 % IV SOLN
1000.0000 mg/m2 | Freq: Once | INTRAVENOUS | Status: AC
  Administered 2018-11-26: 1976 mg via INTRAVENOUS
  Filled 2018-11-26: qty 51.97

## 2018-11-26 MED ORDER — SODIUM CHLORIDE 0.9% FLUSH
10.0000 mL | INTRAVENOUS | Status: DC | PRN
  Administered 2018-11-26: 10 mL
  Filled 2018-11-26: qty 10

## 2018-11-26 MED ORDER — SODIUM CHLORIDE 0.9 % IV SOLN
253.2000 mg | Freq: Once | INTRAVENOUS | Status: AC
  Administered 2018-11-26: 250 mg via INTRAVENOUS
  Filled 2018-11-26: qty 25

## 2018-11-26 MED ORDER — PALONOSETRON HCL INJECTION 0.25 MG/5ML
INTRAVENOUS | Status: AC
  Filled 2018-11-26: qty 5

## 2018-11-26 MED ORDER — DEXAMETHASONE SODIUM PHOSPHATE 10 MG/ML IJ SOLN
INTRAMUSCULAR | Status: AC
  Filled 2018-11-26: qty 1

## 2018-11-26 NOTE — Patient Instructions (Signed)
[facility] Discharge Instructions for Patients Receiving Chemotherapy  Today you received the following chemotherapy agents Gemzar and Carboplatin.   To help prevent nausea and vomiting after your treatment, we encourage you to take your nausea medication as directed.  If you develop nausea and vomiting that is not controlled by your nausea medication, call the clinic.   BELOW ARE SYMPTOMS THAT SHOULD BE REPORTED IMMEDIATELY:  *FEVER GREATER THAN 100.5 F  *CHILLS WITH OR WITHOUT FEVER  NAUSEA AND VOMITING THAT IS NOT CONTROLLED WITH YOUR NAUSEA MEDICATION  *UNUSUAL SHORTNESS OF BREATH  *UNUSUAL BRUISING OR BLEEDING  TENDERNESS IN MOUTH AND THROAT WITH OR WITHOUT PRESENCE OF ULCERS  *URINARY PROBLEMS  *BOWEL PROBLEMS  UNUSUAL RASH Items with * indicate a potential emergency and should be followed up as soon as possible.  Feel free to call the clinic should you have any questions or concerns. The clinic phone number is [phone].  Please show the CHEMO ALERT CARD at check-in to the Emergency Department and triage nurse.  Gemcitabine injection What is this medicine? GEMCITABINE (jem SYE ta been) is a chemotherapy drug. This medicine is used to treat many types of cancer like breast cancer, lung cancer, pancreatic cancer, and ovarian cancer. This medicine may be used for other purposes; ask your health care provider or pharmacist if you have questions. COMMON BRAND NAME(S): Gemzar, Infugem What should I tell my health care provider before I take this medicine? They need to know if you have any of these conditions: -blood disorders -infection -kidney disease -liver disease -lung or breathing disease, like asthma -recent or ongoing radiation therapy -an unusual or allergic reaction to gemcitabine, other chemotherapy, other medicines, foods, dyes, or preservatives -pregnant or trying to get pregnant -breast-feeding How should I use this medicine? This  drug is given as an infusion into a vein. It is administered in a hospital or clinic by a specially trained health care professional. Talk to your pediatrician regarding the use of this medicine in children. Special care may be needed. Overdosage: If you think you have taken too much of this medicine contact a poison control center or emergency room at once. NOTE: This medicine is only for you. Do not share this medicine with others. What if I miss a dose? It is important not to miss your dose. Call your doctor or health care professional if you are unable to keep an appointment. What may interact with this medicine? -medicines to increase blood counts like filgrastim, pegfilgrastim, sargramostim -some other chemotherapy drugs like cisplatin -vaccines Talk to your doctor or health care professional before taking any of these medicines: -acetaminophen -aspirin -ibuprofen -ketoprofen -naproxen This list may not describe all possible interactions. Give your health care provider a list of all the medicines, herbs, non-prescription drugs, or dietary supplements you use. Also tell them if you smoke, drink alcohol, or use illegal drugs. Some items may interact with your medicine. What should I watch for while using this medicine? Visit your doctor for checks on your progress. This drug may make you feel generally unwell. This is not uncommon, as chemotherapy can affect healthy cells as well as cancer cells. Report any side effects. Continue your course of treatment even though you feel ill unless your doctor tells you to stop. In some cases, you may be given additional medicines to help with side effects. Follow all directions for their use. Call your doctor or health care professional for advice if you get a fever, chills or   sore throat, or other symptoms of a cold or flu. Do not treat yourself. This drug decreases your body's ability to fight infections. Try to avoid being around people who are  sick. This medicine may increase your risk to bruise or bleed. Call your doctor or health care professional if you notice any unusual bleeding. Be careful brushing and flossing your teeth or using a toothpick because you may get an infection or bleed more easily. If you have any dental work done, tell your dentist you are receiving this medicine. Avoid taking products that contain aspirin, acetaminophen, ibuprofen, naproxen, or ketoprofen unless instructed by your doctor. These medicines may hide a fever. Do not become pregnant while taking this medicine or for 6 months after stopping it. Women should inform their doctor if they wish to become pregnant or think they might be pregnant. Men should not father a child while taking this medicine and for 3 months after stopping it. There is a potential for serious side effects to an unborn child. Talk to your health care professional or pharmacist for more information. Do not breast-feed an infant while taking this medicine or for at least 1 week after stopping it. Men should inform their doctors if they wish to father a child. This medicine may lower sperm counts. Talk with your doctor or health care professional if you are concerned about your fertility. What side effects may I notice from receiving this medicine? Side effects that you should report to your doctor or health care professional as soon as possible: -allergic reactions like skin rash, itching or hives, swelling of the face, lips, or tongue -breathing problems -pain, redness, or irritation at site where injected -signs and symptoms of a dangerous change in heartbeat or heart rhythm like chest pain; dizziness; fast or irregular heartbeat; palpitations; feeling faint or lightheaded, falls; breathing problems -signs of decreased platelets or bleeding - bruising, pinpoint red spots on the skin, black, tarry stools, blood in the urine -signs of decreased red blood cells - unusually weak or tired,  feeling faint or lightheaded, falls -signs of infection - fever or chills, cough, sore throat, pain or difficulty passing urine -signs and symptoms of kidney injury like trouble passing urine or change in the amount of urine -signs and symptoms of liver injury like dark yellow or brown urine; general ill feeling or flu-like symptoms; light-colored stools; loss of appetite; nausea; right upper belly pain; unusually weak or tired; yellowing of the eyes or skin -swelling of ankles, feet, hands Side effects that usually do not require medical attention (report to your doctor or health care professional if they continue or are bothersome): -constipation -diarrhea -hair loss -loss of appetite -nausea -rash -vomiting This list may not describe all possible side effects. Call your doctor for medical advice about side effects. You may report side effects to FDA at 1-800-FDA-1088. Where should I keep my medicine? This drug is given in a hospital or clinic and will not be stored at home. NOTE: This sheet is a summary. It may not cover all possible information. If you have questions about this medicine, talk to your doctor, pharmacist, or health care provider.  2019 [employer] Standard (2017-12-10 18:06:11)  Carboplatin injection What is this medicine? CARBOPLATIN (KAR boe pla tin) is a chemotherapy drug. It targets fast dividing cells, like cancer cells, and causes these cells to die. This medicine is used to treat ovarian cancer and many other cancers. This medicine may be used for other purposes; ask your health   care provider or pharmacist if you have questions. COMMON BRAND NAME(S): Paraplatin What should I tell my health care provider before I take this medicine? They need to know if you have any of these conditions: -blood disorders -hearing problems -kidney disease -recent or ongoing radiation therapy -an unusual or allergic reaction to carboplatin, cisplatin, other chemotherapy, other  medicines, foods, dyes, or preservatives -pregnant or trying to get pregnant -breast-feeding How should I use this medicine? This drug is usually given as an infusion into a vein. It is administered in a hospital or clinic by a specially trained health care professional. Talk to your pediatrician regarding the use of this medicine in children. Special care may be needed. Overdosage: If you think you have taken too much of this medicine contact a poison control center or emergency room at once. NOTE: This medicine is only for you. Do not share this medicine with others. What if I miss a dose? It is important not to miss a dose. Call your doctor or health care professional if you are unable to keep an appointment. What may interact with this medicine? -medicines for seizures -medicines to increase blood counts like filgrastim, pegfilgrastim, sargramostim -some antibiotics like amikacin, gentamicin, neomycin, streptomycin, tobramycin -vaccines Talk to your doctor or health care professional before taking any of these medicines: -acetaminophen -aspirin -ibuprofen -ketoprofen -naproxen This list may not describe all possible interactions. Give your health care provider a list of all the medicines, herbs, non-prescription drugs, or dietary supplements you use. Also tell them if you smoke, drink alcohol, or use illegal drugs. Some items may interact with your medicine. What should I watch for while using this medicine? Your condition will be monitored carefully while you are receiving this medicine. You will need important blood work done while you are taking this medicine. This drug may make you feel generally unwell. This is not uncommon, as chemotherapy can affect healthy cells as well as cancer cells. Report any side effects. Continue your course of treatment even though you feel ill unless your doctor tells you to stop. In some cases, you may be given additional medicines to help with side  effects. Follow all directions for their use. Call your doctor or health care professional for advice if you get a fever, chills or sore throat, or other symptoms of a cold or flu. Do not treat yourself. This drug decreases your body's ability to fight infections. Try to avoid being around people who are sick. This medicine may increase your risk to bruise or bleed. Call your doctor or health care professional if you notice any unusual bleeding. Be careful brushing and flossing your teeth or using a toothpick because you may get an infection or bleed more easily. If you have any dental work done, tell your dentist you are receiving this medicine. Avoid taking products that contain aspirin, acetaminophen, ibuprofen, naproxen, or ketoprofen unless instructed by your doctor. These medicines may hide a fever. Do not become pregnant while taking this medicine. Women should inform their doctor if they wish to become pregnant or think they might be pregnant. There is a potential for serious side effects to an unborn child. Talk to your health care professional or pharmacist for more information. Do not breast-feed an infant while taking this medicine. What side effects may I notice from receiving this medicine? Side effects that you should report to your doctor or health care professional as soon as possible: -allergic reactions like skin rash, itching or hives, swelling of the   face, lips, or tongue -signs of infection - fever or chills, cough, sore throat, pain or difficulty passing urine -signs of decreased platelets or bleeding - bruising, pinpoint red spots on the skin, black, tarry stools, nosebleeds -signs of decreased red blood cells - unusually weak or tired, fainting spells, lightheadedness -breathing problems -changes in hearing -changes in vision -chest pain -high blood pressure -low blood counts - This drug may decrease the number of white blood cells, red blood cells and platelets. You may be  at increased risk for infections and bleeding. -nausea and vomiting -pain, swelling, redness or irritation at the injection site -pain, tingling, numbness in the hands or feet -problems with balance, talking, walking -trouble passing urine or change in the amount of urine Side effects that usually do not require medical attention (report to your doctor or health care professional if they continue or are bothersome): -hair loss -loss of appetite -metallic taste in the mouth or changes in taste This list may not describe all possible side effects. Call your doctor for medical advice about side effects. You may report side effects to FDA at 1-800-FDA-1088. Where should I keep my medicine? This drug is given in a hospital or clinic and will not be stored at home. NOTE: This sheet is a summary. It may not cover all possible information. If you have questions about this medicine, talk to your doctor, pharmacist, or health care provider.  2019 [employer]/Gold Standard (2007-12-22 14:38:05)   

## 2018-11-26 NOTE — Progress Notes (Signed)
Alexandria Bradley  [phone] [phone]    ID: [name] OB: 1961/07/19  MR#: [id number]  JYN#:[id number]  Patient Care Team: [name], DO as PCP - General (Family Medicine) Josue Hector, MD as PCP - Cardiology (Cardiology) Magrinat, Virgie Dad, MD as Consulting Physician (Oncology) Brien Few, MD as Consulting Physician (Obstetrics and Gynecology) [name], MD as Consulting Physician (General Surgery) [name], MD as Referring Physician (Radiation Oncology) Delrae Rend, MD as Consulting Physician (Endocrinology) Wilford Corner, MD as Consulting Physician (Gastroenterology) Dorna Leitz, MD as Consulting Physician (Orthopedic Surgery) Josue Hector, MD as Consulting Physician (Cardiology) OTHER MD:    CHIEF COMPLAINT: Triple negative breast cancer, locally recurrent  CURRENT TREATMENT: Neoadjuvant chemotherapy   INTERVAL HISTORY: Alexandria Bradley is here today for evaluation prior to receiving her first cycle of neoadjuvant Gemcitabine and Carboplatin.  She will receive this on days 1 and 8 of a 21 day cycle.  Today is cycle 1 day 1.     REVIEW OF SYSTEMS: [name] is tearful today about having to undergo chemotherapy again.  She picked up the anti emetics that were sent in for her and she understands how to take them.  She did undergo a right breast mammogram.  Her right chest wall port is accessed, she notes that the pain she was previously experiencing in it is less apparent.  [name] denies any fevers or chills.  She is without chest pain, cough, shortness of breath, palpitations.  She hasn't noted nausea, vomiting, bowel/bladder changes.  A detailed ROS was otherwise non contributory.     BREAST CANCER HISTORY: From the original intake note of 02/16/2014:  Alexandria Bradley palpated a mass in her left breast early May and brought it immediately to her gynecologist attention. He set her up for bilateral  diagnostic mammography and left ultrasonography at [facility] 02/07/2014. Mammography showed a 1.3 cm oval mass with spiculated margins in the left breast at the 11:00 position. This was palpable. Ultrasound showed a 1.1 cm tall her than wide mass with a microlobulated margins. He was hypoechoic. There were no abnormalities noted in the left axilla.  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 Lyndon Code at conference 02/16/2014), triple negative, with an MIB-1 of 50%.  The patient's subsequent history is as detailed below   PAST MEDICAL HISTORY: Past Medical History:  Diagnosis Date  . Anxiety   . Arthritis   . Back pain   . Breast cancer ([place])    left  . Dyspnea   . Fatty liver   . GERD (gastroesophageal reflux disease)   . Headache(784.0)    PMH : migraines  . History of kidney stones   . Hypercholesterolemia   . Hypertension   . Hypothyroidism   . Kidney stones   . Osteoporosis   . Personal history of chemotherapy   . Personal history of radiation therapy   . PONV (postoperative nausea and vomiting)    difficult to wake up  . Pre-diabetes   . Psoriasis   . PVC's (premature ventricular contractions)   . Radiation 08/18/14-10/07/14   Left breast    PAST SURGICAL HISTORY: Past Surgical History:  Procedure Laterality Date  . BREAST BIOPSY Left 11/10/2018  . BREAST LUMPECTOMY Left 2015  . BREAST LUMPECTOMY WITH AXILLARY LYMPH NODE BIOPSY  6/15   left  . CARDIAC CATHETERIZATION    . CHOLECYSTECTOMY    .  COLONOSCOPY N/A 06/27/2014   Procedure: COLONOSCOPY;  Surgeon: Lear Ng, MD;  Location: [facility] ENDOSCOPY;  Service: Endoscopy;  Laterality: N/A;  . LEFT HEART CATH AND CORONARY ANGIOGRAPHY N/A 07/24/2018   Procedure: LEFT HEART CATH AND CORONARY ANGIOGRAPHY;  Surgeon: Troy Sine, MD;  Location: [facility];  Service: Cardiovascular;  Laterality:  N/A;  . LIPOMA EXCISION    . MYOMECTOMY    . PORT-A-CATH REMOVAL N/A 02/03/2015   Procedure: REMOVAL PORT-A-CATH;  Surgeon: [name], MD;  Location: [place];  Service: General;  Laterality: N/A;  . PORTACATH PLACEMENT N/A 03/01/2014   Procedure: INSERTION PORT-A-CATH;  Surgeon: [name], MD;  Location: [place];  Service: General;  Laterality: N/A;  . PORTACATH PLACEMENT N/A 11/18/2018   Procedure: INSERTION PORT-A-CATH WITH ULTRASOUND;  Surgeon: [name], MD;  Location: [place];  Service: General;  Laterality: N/A;  . TONSILLECTOMY    . TUBAL LIGATION      FAMILY HISTORY Family History  Problem Relation Age of Onset  . Endometrial cancer Mother 58  . Hearing loss Mother   . Atrial fibrillation Mother   . Lung cancer Father   . Brain cancer Father   . Heart disease Maternal Aunt   . Atrial fibrillation Maternal Aunt   . Lung cancer Maternal Uncle   . Atrial fibrillation Maternal Uncle   . Stroke Maternal Grandmother   . Stroke Maternal Grandfather   . Bladder Cancer Maternal Uncle   . Multiple myeloma Maternal Uncle   . Other Son        trisomy 18   The patient's father died at the age of 106 from what may have been metastatic lung cancer. The patient knows very little about the father's side of her family. Her mother is alive at age 51. She was recently diagnosed with endometrial cancer. The patient has one brother, 2 sisters. There is no history of breast or ovarian cancer in the family.   GYNECOLOGIC HISTORY:  Menarche age 66, first live birth age 21. The patient is GX P3. One child died shortly after birth. She stopped having periods approximately 2005. She did not use hormone replacement. 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 Son [name] is  Nurse, adult in [place]. Daughter [name] lives in [place] and works for [employer] as an Therapist, sports. She has three grandchildren.    ADVANCED DIRECTIVES: Not in place   HEALTH MAINTENANCE: Social History   Tobacco Use  . Smoking status: Former Smoker    Packs/day: 0.50    Years: 15.00    Pack years: 7.50    Types: Cigarettes  . Smokeless tobacco: Never Used  . Tobacco comment: Quit smoking cigarettes in 2002  Substance Use Topics  . Alcohol use: Yes    Comment: social  . Drug use: No     Colonoscopy: 2010  PAP: 2013  Bone density: 05/15/2009, at [facility], normal, lowest T score -0.8  Lipid panel:  Allergies  Allergen Reactions  . Chlorhexidine Itching  . Ciprofloxacin Anxiety, Rash and Other (See Comments)    GI upset  . Prednisone Shortness Of Breath and Other (See Comments)    "jacks me up" jittery   . Codeine  Nausea Only  . Cortisone Swelling and Other (See Comments)    "jack me up"     Current Outpatient Medications  Medication Sig Dispense Refill  . acetaminophen (TYLENOL) 500 MG tablet Take 1,000 mg by mouth at bedtime as needed for headache.     . betamethasone dipropionate (DIPROLENE) 0.05 % cream Apply 1 application topically daily as needed (psoriasis).    . bismuth subsalicylate (PEPTO BISMOL) 262 MG chewable tablet Chew 524 mg by mouth as needed for indigestion.    Marland Kitchen dexamethasone (DECADRON) 4 MG tablet Take 2 tablets (8 mg total) by mouth daily. Start the day after chemotherapy for 2 days. Take with food. 30 tablet 1  . diltiazem (CARDIZEM CD) 240 MG 24 hr capsule Take 1 capsule (240 mg total) by mouth daily. 90 capsule 3  . fluticasone (FLONASE) 50 MCG/ACT nasal spray SPRAY 2 SPRAYS INTO EACH NOSTRIL EVERY DAY (Patient taking differently: Place 1 spray into both nostrils daily as needed for allergies. ) 48 g 1  . lidocaine-prilocaine (EMLA)  cream Apply to affected area once 30 g 3  . liothyronine (CYTOMEL) 5 MCG tablet Take 5 mcg by mouth daily before breakfast.     . loratadine (CLARITIN) 10 MG tablet Take 10 mg by mouth at bedtime.     . metFORMIN (GLUCOPHAGE-XR) 500 MG 24 hr tablet Take 1,000 mg by mouth every evening.    Marland Kitchen Phenylephrine-Acetaminophen (TYLENOL SINUS+HEADACHE PO) Take 1 tablet by mouth daily as needed (sinus headaches).    . prochlorperazine (COMPAZINE) 10 MG tablet Take 1 tablet (10 mg total) by mouth every 6 (six) hours as needed (Nausea or vomiting). 30 tablet 1  . propranolol (INDERAL) 10 MG tablet Take 10 mg by mouth daily as needed (heart palpitations).     . TIROSINT 100 MCG CAPS Take 100 mcg by mouth daily before breakfast.   4  . triamterene-hydrochlorothiazide (MAXZIDE-25) 37.5-25 MG per tablet Take 1 tablet by mouth daily.      No current facility-administered medications for this visit.     OBJECTIVE: Vitals:   11/26/18 1317  BP: (!) 143/66  Pulse: 72  Resp: 18  Temp: (!) 97.5 F (36.4 C)  SpO2: 98%     Body mass index is 31.52 kg/m.    ECOG FS:1 - Symptomatic but completely ambulatory GENERAL: Patient is a well appearing female in no acute distress HEENT:  Sclerae anicteric.  Oropharynx clear and moist. No ulcerations or evidence of oropharyngeal candidiasis. Neck is supple.  NODES:  No cervical, supraclavicular, or axillary lymphadenopathy palpated.  BREAST EXAM:  Declined today LUNGS:  Clear to auscultation bilaterally.  No wheezes or rhonchi. HEART:  Regular rate and rhythm. No murmur appreciated. ABDOMEN:  Soft, nontender.  Positive, normoactive bowel sounds. No organomegaly palpated. MSK:  No focal spinal tenderness to palpation. Full range of motion bilaterally in the upper extremities. EXTREMITIES:  No peripheral edema.   SKIN:  Clear with no obvious rashes or skin changes. No nail dyscrasia. NEURO:  Nonfocal. Well oriented.  Appropriate affect.       LAB RESULTS: No  results found for: SPEP  Lab Results  Component Value Date   WBC 7.0 11/19/2018   NEUTROABS 4.1 11/19/2018   HGB 13.3 11/19/2018   HCT 40.8 11/19/2018   MCV 93.8 11/19/2018   PLT 302 11/19/2018      Chemistry      Component Value Date/Time   NA 141 11/19/2018 1325   NA 140 06/19/2017 1312  K 3.6 11/19/2018 1325   K 3.5 06/19/2017 1312   CL 104 11/19/2018 1325   CO2 28 11/19/2018 1325   CO2 25 06/19/2017 1312   BUN 9 11/19/2018 1325   BUN 18.4 06/19/2017 1312   CREATININE 0.80 11/19/2018 1325   CREATININE 0.8 06/19/2017 1312      Component Value Date/Time   CALCIUM 9.5 11/19/2018 1325   CALCIUM 10.2 06/19/2017 1312   ALKPHOS 72 11/19/2018 1325   ALKPHOS 86 06/19/2017 1312   AST 33 11/19/2018 1325   AST 15 06/19/2017 1312   ALT 59 (H) 11/19/2018 1325   ALT 22 06/19/2017 1312   BILITOT 0.3 11/19/2018 1325   BILITOT 0.26 06/19/2017 1312       No results found for: LABCA2  No components found for: LABCA125  No results for input(s): INR in the last 168 hours.  Urinalysis    Component Value Date/Time   COLORURINE YELLOW 03/13/2009 2251   APPEARANCEUR CLOUDY (A) 03/13/2009 2251   LABSPEC 1.020 06/15/2014 1101   PHURINE 6.0 06/15/2014 1101   PHURINE 5.5 03/13/2009 2251   GLUCOSEU Negative 06/15/2014 1101   HGBUR Negative 06/15/2014 1101   HGBUR NEGATIVE 03/13/2009 2251   BILIRUBINUR Color Interference 06/15/2014 1101   KETONESUR Negative 06/15/2014 1101   KETONESUR NEGATIVE 03/13/2009 2251   PROTEINUR 30 06/15/2014 1101   PROTEINUR NEGATIVE 03/13/2009 2251   UROBILINOGEN 0.2 06/15/2014 1101   NITRITE Negative 06/15/2014 1101   NITRITE NEGATIVE 03/13/2009 2251   LEUKOCYTESUR Trace 06/15/2014 1101    STUDIES: Ct Chest W Contrast  Result Date: 11/12/2018 CLINICAL DATA:  Recurrent breast ca - Dx'd yesterday? Metastasis SOB occasionallyRLQ pain off and on x few weeksHx left breast ca - chemo/radiation and lumpectomyHx myomectomy, GB, tubalPrior in  PACSFormer smoker EXAM: CT CHEST, ABDOMEN, AND PELVIS WITH CONTRAST TECHNIQUE: Multidetector CT imaging of the chest, abdomen and pelvis was performed following the standard protocol during bolus administration of intravenous contrast. Creatinine was obtained on site at [place] at [address] Results: Creatinine 0.7 mg/dL. CONTRAST:  186m ISOVUE-300 IOPAMIDOL (ISOVUE-300) INJECTION 61% COMPARISON:  Abdomen and pelvis CT, 02/15/2016. FINDINGS: CT CHEST FINDINGS Cardiovascular: Heart is normal in size and configuration. No pericardial effusion. No coronary artery calcifications. Great vessels are normal in caliber. No aortic atherosclerosis. Arch branch vessels are widely patent. Mediastinum/Nodes: No neck base, axillary, mediastinal or hilar masses or enlarged lymph nodes. Trachea and esophagus are unremarkable. Lungs/Pleura: 2 small, 2-3 mm, nodules, peripheral right middle lobe, images number 77 and 79, series 7. Small dense nodule, left upper lobe, image 29, consistent with a healed granuloma. No other nodules. Mild peripheral reticular opacities in the anterolateral left upper lobe consistent with radiation induced fibrosis. Lungs otherwise clear.  No pleural effusion.  No pneumothorax. Musculoskeletal: No fracture or acute finding. No osteoblastic or osteolytic lesions. Postsurgical changes in the left breast. CT ABDOMEN PELVIS FINDINGS Hepatobiliary: 3-4 mm low-density lesion at the dome of the liver, most likely a cyst. Liver demonstrates diffuse decreased attenuation consistent with fatty infiltration. No other masses or focal lesions. Gallbladder surgically absent. No bile duct dilation. Pancreas: Unremarkable. No pancreatic ductal dilatation or surrounding inflammatory changes. Spleen: Normal in size without focal abnormality. Adrenals/Urinary Tract: , Orientation no adrenal masses. Kidneys and position normal size with symmetric enhancement and excretion. 4 mm low-density lesion,  anterior left kidney at the midpole, consistent with a cyst. No other renal masses or lesions. No stones. No hydronephrosis. Normal ureters. Normal bladder. Stomach/Bowel:  Stomach is within normal limits. Appendix appears normal. No evidence of bowel wall thickening, distention, or inflammatory changes. Vascular/Lymphatic: Minimal aortic atherosclerosis. No other vascular abnormality. No lymphadenopathy. Reproductive: Uterus and bilateral adnexa are unremarkable. Other: No abdominal wall hernia or abnormality. No abdominopelvic ascites. Musculoskeletal: No fracture or acute finding. No osteoblastic or osteolytic lesions. IMPRESSION: 1. Two, 2-3 mm right middle lobe nodules. These most likely benign. A portion of 1 small nodule was suggested on the lung bases from an abdomen and pelvis CT dated 06/17/2014. Metastatic disease is not excluded, but felt unlikely. 2. There are no other findings to suggest metastatic disease from breast carcinoma to the chest, abdomen or pelvis. 3. No acute abnormalities. 4. Changes from previous left lumpectomy with mild left upper lobe anterolateral radiation induce lung scarring. 5. Hepatic steatosis. Electronically Signed   By: Lajean Manes M.D.   On: 11/12/2018 14:16   Mr Thoracic Spine W Wo Contrast  Result Date: 11/23/2018 CLINICAL DATA:  Restaging breast ca. eval for possible uptake seen on bone scan in pacs. Pt extremely claustrophobic. EXAM: MRI THORACIC WITHOUT AND WITH CONTRAST TECHNIQUE: Multiplanar and multiecho pulse sequences of the thoracic spine were obtained without and with intravenous contrast. CONTRAST:  8 ml Gadavist COMPARISON:  Bone scan 11/13/2018 FINDINGS: MRI THORACIC SPINE FINDINGS Alignment:  Physiologic. Vertebrae: No fracture, evidence of discitis, or bone lesion. Cord:  Normal signal and morphology. Paraspinal and other soft tissues: No acute paraspinal abnormality. Disc levels: Disc spaces: Degenerative disc disease with disc height loss at T10-11  and T11-12. T1-T2: No disc protrusion, foraminal stenosis or central canal stenosis. T2-T3: No disc protrusion, foraminal stenosis or central canal stenosis. T3-T4: No disc protrusion, foraminal stenosis or central canal stenosis. T4-T5: No disc protrusion, foraminal stenosis or central canal stenosis. T5-T6: No disc protrusion, foraminal stenosis or central canal stenosis. T6-T7: No disc protrusion, foraminal stenosis or central canal stenosis. T7-T8: No disc protrusion, foraminal stenosis or central canal stenosis. T8-T9: No disc protrusion, foraminal stenosis or central canal stenosis. T9-T10: No disc protrusion, foraminal stenosis or central canal stenosis. Left facet arthropathy at T9-10. T10-T11: No disc protrusion, foraminal stenosis or central canal stenosis. Left facet arthropathy. T11-T12: No disc protrusion, foraminal stenosis or central canal stenosis. IMPRESSION: 1. No aggressive osseous lesion of the thoracic spine to suggest metastatic disease. Electronically Signed   By: Kathreen Devoid   On: 11/23/2018 13:05   Nm Bone Scan Whole Body  Result Date: 11/13/2018 CLINICAL DATA:  LEFT breast cancer post radiation therapy, recurrent LEFT breast cancer question metastasis EXAM: NUCLEAR MEDICINE WHOLE BODY BONE SCAN TECHNIQUE: Whole body anterior and posterior images were obtained approximately 3 hours after intravenous injection of radiopharmaceutical. RADIOPHARMACEUTICALS:  21.6 mCi Technetium-34mMDP IV COMPARISON:  None Correlation: CT chest abdomen pelvis 11/12/2018 FINDINGS: Uptake at the shoulders, sternoclavicular joints, elbows, hands, and knees, typically degenerative. Foci of abnormal increased tracer localization in the midthoracic spine at approximately T5 or T4, and at the LEFT lateral aspect of TS06-[id number] etiology; degenerative changes are present at these sites but no definite lytic or sclerotic bone lesions are seen. No additional sites of abnormal osseous tracer accumulation are  identified. Expected urinary tract and soft tissue distribution of tracer. IMPRESSION: Uptake at 2 sites in the thoracic spine, nonspecific but potentially degenerative based on accompanying CT findings though metastases are not entirely excluded. No additional bone lesions are identified to suggest osseous metastatic disease. Electronically Signed   By: MLavonia DanaM.D.   On: 11/13/2018  13:58   Ct Abdomen Pelvis W Contrast  Result Date: 11/12/2018 CLINICAL DATA:  Recurrent breast ca - Dx'd yesterday? Metastasis SOB occasionallyRLQ pain off and on x few weeksHx left breast ca - chemo/radiation and lumpectomyHx myomectomy, GB, tubalPrior in PACSFormer smoker EXAM: CT CHEST, ABDOMEN, AND PELVIS WITH CONTRAST TECHNIQUE: Multidetector CT imaging of the chest, abdomen and pelvis was performed following the standard protocol during bolus administration of intravenous contrast. Creatinine was obtained on site at [facility] at [address] Results: Creatinine 0.7 mg/dL. CONTRAST:  152m ISOVUE-300 IOPAMIDOL (ISOVUE-300) INJECTION 61% COMPARISON:  Abdomen and pelvis CT, 02/15/2016. FINDINGS: CT CHEST FINDINGS Cardiovascular: Heart is normal in size and configuration. No pericardial effusion. No coronary artery calcifications. Great vessels are normal in caliber. No aortic atherosclerosis. Arch branch vessels are widely patent. Mediastinum/Nodes: No neck base, axillary, mediastinal or hilar masses or enlarged lymph nodes. Trachea and esophagus are unremarkable. Lungs/Pleura: 2 small, 2-3 mm, nodules, peripheral right middle lobe, images number 77 and 79, series 7. Small dense nodule, left upper lobe, image 29, consistent with a healed granuloma. No other nodules. Mild peripheral reticular opacities in the anterolateral left upper lobe consistent with radiation induced fibrosis. Lungs otherwise clear.  No pleural effusion.  No pneumothorax. Musculoskeletal: No fracture or acute finding. No osteoblastic or  osteolytic lesions. Postsurgical changes in the left breast. CT ABDOMEN PELVIS FINDINGS Hepatobiliary: 3-4 mm low-density lesion at the dome of the liver, most likely a cyst. Liver demonstrates diffuse decreased attenuation consistent with fatty infiltration. No other masses or focal lesions. Gallbladder surgically absent. No bile duct dilation. Pancreas: Unremarkable. No pancreatic ductal dilatation or surrounding inflammatory changes. Spleen: Normal in size without focal abnormality. Adrenals/Urinary Tract: , Orientation no adrenal masses. Kidneys and position normal size with symmetric enhancement and excretion. 4 mm low-density lesion, anterior left kidney at the midpole, consistent with a cyst. No other renal masses or lesions. No stones. No hydronephrosis. Normal ureters. Normal bladder. Stomach/Bowel: Stomach is within normal limits. Appendix appears normal. No evidence of bowel wall thickening, distention, or inflammatory changes. Vascular/Lymphatic: Minimal aortic atherosclerosis. No other vascular abnormality. No lymphadenopathy. Reproductive: Uterus and bilateral adnexa are unremarkable. Other: No abdominal wall hernia or abnormality. No abdominopelvic ascites. Musculoskeletal: No fracture or acute finding. No osteoblastic or osteolytic lesions. IMPRESSION: 1. Two, 2-3 mm right middle lobe nodules. These most likely benign. A portion of 1 small nodule was suggested on the lung bases from an abdomen and pelvis CT dated 06/17/2014. Metastatic disease is not excluded, but felt unlikely. 2. There are no other findings to suggest metastatic disease from breast carcinoma to the chest, abdomen or pelvis. 3. No acute abnormalities. 4. Changes from previous left lumpectomy with mild left upper lobe anterolateral radiation induce lung scarring. 5. Hepatic steatosis. Electronically Signed   By: DLajean ManesM.D.   On: 11/12/2018 14:16   Mr Breast Bilateral W Wo Contrast Inc Cad  Result Date: 11/20/2018 CLINICAL  DATA:  58year old female who presented in early February of 2020 with increasing fullness at her lumpectomy site (original lumpectomy in 2015) in the upper inner left breast. Ultrasound-guided biopsy was performed of a mass at the lumpectomy site, demonstrating grade 2-3 invasive ductal carcinoma. LABS:  Creatinine of 0.8 mg/dL and GFR greater than 60 on 11/19/2018. EXAM: BILATERAL BREAST MRI WITH AND WITHOUT CONTRAST TECHNIQUE: Multiplanar, multisequence MR images of both breasts were obtained prior to and following the intravenous administration of 8 ml of Gadavist Three-dimensional MR images were rendered  by post-processing of the original MR data on an independent workstation. The three-dimensional MR images were interpreted, and findings are reported in the following complete MRI report for this study. Three dimensional images were evaluated at the independent DynaCad workstation COMPARISON:  No prior MRI available for comparison. Correlation made with prior mammograms and ultrasounds. FINDINGS: Breast composition: c. Heterogeneous fibroglandular tissue. Background parenchymal enhancement: Moderate. Right breast: A Port-A-Cath is seen in the upper inner right breast. No mass or abnormal enhancement. Left breast: In the upper inner quadrant of the left breast, posterior depth, there is an enhancing irregular spiculated mass measuring 1.7 x 1.6 x 1.4 cm (series 10506, image 21). This corresponds with the biopsy proven site of malignancy. Lymph nodes: No abnormal appearing lymph nodes. Ancillary findings:  None. IMPRESSION: 1. The known malignancy in the upper inner quadrant of the left breast at the patient's lumpectomy site measures 1.7 cm. 2.  No evidence of malignancy in the right breast. RECOMMENDATION: Continue treatment plan for known left breast cancer. BI-RADS CATEGORY  6: Known biopsy-proven malignancy. Electronically Signed   By: Ammie Ferrier M.D.   On: 11/20/2018 16:00   Dg Chest Port 1  View  Result Date: 11/18/2018 CLINICAL DATA:  Port-A-Cath placement. EXAM: PORTABLE CHEST 1 VIEW COMPARISON:  CT chest 11/12/2018. FINDINGS: Heart size is exaggerated by low lung volumes. Surgical clips are present in the left breast. There is mild exaggeration of the pulmonary interstitium, likely due to lung the lung volumes. No focal airspace disease is present. A right IJ Port-A-Cath is in place. The tip is in the mid SVC. There is no pneumothorax. IMPRESSION: 1. Interval placement of right IJ Port-A-Cath without radiographic evidence for complication. 2. Borderline cardiomegaly without failure. Electronically Signed   By: San Morelle M.D.   On: 11/18/2018 16:50   Dg Fluoro Guide Cv Line-no Report  Result Date: 11/18/2018 Fluoroscopy was utilized by the requesting physician.  No radiographic interpretation.   US Breast Ltd Uni Left Inc [place]  Result Date: 11/05/2018 CLINICAL DATA:  Left lumpectomy in 2015. Increasing fullness and lumpiness at the scar site. EXAM: DIGITAL DIAGNOSTIC LEFT MAMMOGRAM WITH CAD AND TOMO ULTRASOUND LEFT BREAST COMPARISON:  Previous exam(s). ACR Breast Density Category c: The breast tissue is heterogeneously dense, which may obscure small masses. FINDINGS: There appears to be increased fullness and density in the region of the patient's scar. On 3D imaging, there appears to be a possible underlying obscured mass. Mammographic images were processed with CAD. On physical exam, a lump is felt at the lumpectomy site. Targeted ultrasound is performed, showing a mass in close proximity to the scar at 10 o'clock, 7 cm from the nipple measuring 14 x 12 x 13 mm. This is more masslike than typical or classic for a scar and is worrisome for recurrence. No axillary adenopathy. IMPRESSION: Suspicion for recurrence at the left lumpectomy site. RECOMMENDATION: Recommend ultrasound-guided biopsy of the left breast mass. I have discussed the findings and recommendations with the  patient. Results were also provided in writing at the conclusion of the visit. If applicable, a reminder letter will be sent to the patient regarding the next appointment. BI-RADS CATEGORY  4: Suspicious. Electronically Signed   By: Dorise Bullion III M.D   On: 11/05/2018 13:41   Mm Diag Breast Tomo Uni Left  Result Date: 11/05/2018 CLINICAL DATA:  Left lumpectomy in 2015. Increasing fullness and lumpiness at the scar site. EXAM: DIGITAL DIAGNOSTIC LEFT MAMMOGRAM WITH CAD AND TOMO ULTRASOUND LEFT  BREAST COMPARISON:  Previous exam(s). ACR Breast Density Category c: The breast tissue is heterogeneously dense, which may obscure small masses. FINDINGS: There appears to be increased fullness and density in the region of the patient's scar. On 3D imaging, there appears to be a possible underlying obscured mass. Mammographic images were processed with CAD. On physical exam, a lump is felt at the lumpectomy site. Targeted ultrasound is performed, showing a mass in close proximity to the scar at 10 o'clock, 7 cm from the nipple measuring 14 x 12 x 13 mm. This is more masslike than typical or classic for a scar and is worrisome for recurrence. No axillary adenopathy. IMPRESSION: Suspicion for recurrence at the left lumpectomy site. RECOMMENDATION: Recommend ultrasound-guided biopsy of the left breast mass. I have discussed the findings and recommendations with the patient. Results were also provided in writing at the conclusion of the visit. If applicable, a reminder letter will be sent to the patient regarding the next appointment. BI-RADS CATEGORY  4: Suspicious. Electronically Signed   By: Dorise Bullion III M.D   On: 11/05/2018 13:41   Mm Diag Breast Tomo Uni Right  Result Date: 11/24/2018 CLINICAL DATA:  Recurrence of malignancy in the left breast. Right-sided mammography prior to treatment. EXAM: DIGITAL DIAGNOSTIC UNILATERAL RIGHT MAMMOGRAM WITH CAD AND TOMO COMPARISON:  Previous exam(s). ACR Breast Density  Category b: There are scattered areas of fibroglandular density. FINDINGS: No suspicious masses, calcifications, or distortion. The patient's right port site is identified. There are post surgical changes around the port. Mammographic images were processed with CAD. IMPRESSION: No mammographic evidence of malignancy in the right breast. RECOMMENDATION: Continued surgical follow up for the left-sided recurrence. Annual mammography on the right. I have discussed the findings and recommendations with the patient. Results were also provided in writing at the conclusion of the visit. If applicable, a reminder letter will be sent to the patient regarding the next appointment. BI-RADS CATEGORY  2: Benign. Electronically Signed   By: Dorise Bullion III M.D   On: 11/24/2018 11:55   Mm Clip Placement Left  Result Date: 11/10/2018 CLINICAL DATA:  Patient with palpable left breast mass at the lumpectomy site. EXAM: DIAGNOSTIC LEFT MAMMOGRAM POST ULTRASOUND BIOPSY COMPARISON:  Previous exam(s). FINDINGS: Mammographic images were obtained following ultrasound guided biopsy of left breast mass 10 o'clock position. Ribbon shaped marking clip in appropriate position. IMPRESSION: Appropriate position ribbon shaped marking clip status post ultrasound-guided biopsy left breast mass. Final Assessment: Post Procedure Mammograms for Marker Placement Electronically Signed   By: Lovey Newcomer M.D.   On: 11/10/2018 14:44   [place] Lt Breast Bx W Loc Dev 1st Lesion Img Bx Spec US Guide  Addendum Date: 11/11/2018   ADDENDUM REPORT: 11/11/2018 12:16 ADDENDUM: Pathology revealed GRADE II-III INVASIVE DUCTAL CARCINOMA of the Left breast, 10 o'clock. This was found to be concordant by Dr. Lovey Newcomer. Pathology results were discussed with the patient by telephone. The patient reported doing well after the biopsy with tenderness at the site. Post biopsy instructions and care were reviewed and questions were answered. The patient was encouraged to  call [facility] for any additional concerns. Surgical consultation has been arranged with Dr. [name], per patient request, at [facility] on November 11, 2018. Pathology results reported by Terie Purser, RN on 11/11/2018. Electronically Signed   By: Lovey Newcomer M.D.   On: 11/11/2018 12:16   Result Date: 11/11/2018 CLINICAL DATA:  Patient with palpable mass within  the upper inner left breast at the lumpectomy site. EXAM: ULTRASOUND GUIDED LEFT BREAST CORE NEEDLE BIOPSY COMPARISON:  Previous exam(s). FINDINGS: I met with the patient and we discussed the procedure of ultrasound-guided biopsy, including benefits and alternatives. We discussed the high likelihood of a successful procedure. We discussed the risks of the procedure, including infection, bleeding, tissue injury, clip migration, and inadequate sampling. Informed written consent was given. The usual time-out protocol was performed immediately prior to the procedure. Lesion quadrant: Upper inner quadrant Using sterile technique and 1% Lidocaine as local anesthetic, under direct ultrasound visualization, a 12 gauge spring-loaded device was used to perform biopsy of left breast mass 10 o'clock position 7 cm from the nipple using a medial approach. At the conclusion of the procedure a ribbon shaped tissue marker clip was deployed into the biopsy cavity. Follow up 2 view mammogram was performed and dictated separately. IMPRESSION: Ultrasound guided biopsy of left breast mass. No apparent complications. Electronically Signed: By: Lovey Newcomer M.D. On: 11/10/2018 14:43    ASSESSMENT: 58 y.o. [place] woman status post left breast upper inner quadrant biopsy 02/08/2014 for a clinical T1c N0, stage IA invasive ductal carcinoma, grade 3, triple negative, with an MIB-1 of 50%.  (1) status post left lumpectomy and sentinel lymph node sampling 03/01/2014 for a pT1c pN0, stage IA invasive ductal carcinoma, grade 3,  with negative margins, repeat prognostic panel again triple negative.  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  Left breast with breath hold technique/ 45 Gy at 1.8 Gy per fraction x 25 fractions.   Left breast boost/ 16 Gy at 2 Gy per fraction x 8 fractions  (4) genetics counseling 07/06/2015 through the Breast/Ovarian cancer gene panel offered by [facility] found no deleterious mutations in ATM, BARD1, BRCA1, BRCA2, BRIP1, CDH1, CHEK2, EPCAM, FANCC, MLH1, MSH2, MSH6, NBN, PALB2, PMS2, PTEN, RAD51C, RAD51D, TP53, and XRCC2.  (5) question of sleep apnea  (6) transverse colon lesion noted on CT scan from 06/17/2014 - negative biopsy, also showed fatty liver  (a) repeat CT scan of the abdomen and pelvis with contrast 02/15/2016 negative  RECURRENT DISEASE: February 2020 (7) biopsy of palpable mass upper inner left breast 11/10/2018 confirms invasive ductal carcinoma, grade 2 or 3, triple negative, with an MIB-1 of 80%.  (a) CT scans of the chest abdomen and pelvis with contrast showed no stage IV disease  (b) bone scan 11/13/2018 shows no definite metastasis to bone  (8) neoadjuvant chemotherapy will consist of carboplatin and gemcitabine given days 1 and 8 of each 21-day cycle, for 6 cycles  (9) bilateral mastectomies with left sentinel lymph node sampling and immediate reconstruction to follow   PLAN: Shala is doing well today.  Her labs are normal and she will proceed with her first cycle of neoadjuvant chemotherapy.  She understands how to take her  anti emetics.    Alexandria Bradley and I reviewed her chemotherapy regimen again, and common side effects.  We reviewed that she may need growth factor support, particularly since she has received chemotherapy before.  She knows to call us for any other questions or concerns.  Elianny will  return in one week for labs, Gemcitabine/carboplatin and in 3 weeks for labs, f/u with me, and cycle 2 day 1 of Gemcitabine and Carboplatin.  She knows to call for any other issues that may develop before the next visit.  A total of (20) minutes of face-to-face time was spent with this patient with greater than 50% of that time in counseling and care-coordination.   Wilber Bihari, NP  11/26/18 1:26 PM Medical Oncology and Hematology [facility] [address] [place], [place] [address] Tel. [phone]    Fax. [phone]

## 2018-11-26 NOTE — Telephone Encounter (Signed)
Gave avs and calendar ° °

## 2018-11-27 ENCOUNTER — Telehealth: Payer: Self-pay | Admitting: *Deleted

## 2018-11-27 NOTE — Telephone Encounter (Signed)
-----   Message from Myrtie Hawk, RN sent at 11/26/2018  4:19 PM EST ----- Regarding: Dr. Jana Hakim first time chemo follow up Pt of Dr. Jana Hakim first time gemzar and carboplatin follow up call.  Pt tolerated treatment well.

## 2018-11-27 NOTE — Telephone Encounter (Signed)
Alexandria Bradley [phone]).  "I took compazine and ate boiled egg and toast 45-minutes ago.  Can I take the dexamethasone now or wait an hour?  Do I take two pills at the same time or one now and the second pill later? "   Questions answered, completed chemotherapy F/U with this call.  Patient is doing well.  Denies vomiting or any new side effects or symptoms.  Bowels moved on 11-25-2018.  Bladder functioning well.  Eating well.  Drinking coffee and sodas reporting headache before treatment.  Headache woke me up at 3:45 am tried tylenol.  This morning's coffee has taken the edge off."  Instructed to drink a minimum of 64 oz water daily in addition to other beverages or at least the day before, of and after treatment.   Denies further questions or needs at this time.  Will try colace.  Encouraged to call [phone] anytime as needed for symptoms, changes or events.  Aware after hours, Nurse Call Team assists.

## 2018-12-03 ENCOUNTER — Inpatient Hospital Stay: Payer: No Typology Code available for payment source

## 2018-12-03 ENCOUNTER — Inpatient Hospital Stay: Payer: No Typology Code available for payment source | Attending: Oncology

## 2018-12-03 VITALS — BP 125/79 | HR 63 | Temp 98.2°F | Resp 16 | Wt 191.8 lb

## 2018-12-03 DIAGNOSIS — Z79899 Other long term (current) drug therapy: Secondary | ICD-10-CM | POA: Diagnosis not present

## 2018-12-03 DIAGNOSIS — E039 Hypothyroidism, unspecified: Secondary | ICD-10-CM | POA: Diagnosis not present

## 2018-12-03 DIAGNOSIS — Z5111 Encounter for antineoplastic chemotherapy: Secondary | ICD-10-CM | POA: Insufficient documentation

## 2018-12-03 DIAGNOSIS — Z7984 Long term (current) use of oral hypoglycemic drugs: Secondary | ICD-10-CM | POA: Insufficient documentation

## 2018-12-03 DIAGNOSIS — Z95828 Presence of other vascular implants and grafts: Secondary | ICD-10-CM

## 2018-12-03 DIAGNOSIS — Z9013 Acquired absence of bilateral breasts and nipples: Secondary | ICD-10-CM | POA: Insufficient documentation

## 2018-12-03 DIAGNOSIS — Z923 Personal history of irradiation: Secondary | ICD-10-CM | POA: Diagnosis not present

## 2018-12-03 DIAGNOSIS — F419 Anxiety disorder, unspecified: Secondary | ICD-10-CM | POA: Insufficient documentation

## 2018-12-03 DIAGNOSIS — Z87891 Personal history of nicotine dependence: Secondary | ICD-10-CM | POA: Insufficient documentation

## 2018-12-03 DIAGNOSIS — C50212 Malignant neoplasm of upper-inner quadrant of left female breast: Secondary | ICD-10-CM | POA: Diagnosis present

## 2018-12-03 DIAGNOSIS — I10 Essential (primary) hypertension: Secondary | ICD-10-CM | POA: Diagnosis not present

## 2018-12-03 DIAGNOSIS — Z171 Estrogen receptor negative status [ER-]: Secondary | ICD-10-CM | POA: Diagnosis not present

## 2018-12-03 DIAGNOSIS — E7800 Pure hypercholesterolemia, unspecified: Secondary | ICD-10-CM | POA: Insufficient documentation

## 2018-12-03 DIAGNOSIS — C50912 Malignant neoplasm of unspecified site of left female breast: Secondary | ICD-10-CM

## 2018-12-03 LAB — COMPREHENSIVE METABOLIC PANEL
ALBUMIN: 3.7 g/dL (ref 3.5–5.0)
ALT: 55 U/L — ABNORMAL HIGH (ref 0–44)
AST: 34 U/L (ref 15–41)
Alkaline Phosphatase: 62 U/L (ref 38–126)
Anion gap: 7 (ref 5–15)
BILIRUBIN TOTAL: 0.2 mg/dL — AB (ref 0.3–1.2)
BUN: 13 mg/dL (ref 6–20)
CO2: 27 mmol/L (ref 22–32)
Calcium: 8.8 mg/dL — ABNORMAL LOW (ref 8.9–10.3)
Chloride: 102 mmol/L (ref 98–111)
Creatinine, Ser: 0.67 mg/dL (ref 0.44–1.00)
GFR calc Af Amer: >60 mL/min (ref >60)
GFR calc non Af Amer: >60 mL/min (ref >60)
GLUCOSE: 78 mg/dL (ref 70–99)
Potassium: 3.9 mmol/L (ref 3.5–5.1)
Sodium: 136 mmol/L (ref 135–145)
Total Protein: 6.6 g/dL (ref 6.5–8.1)

## 2018-12-03 LAB — CBC WITH DIFFERENTIAL/PLATELET
Abs Immature Granulocytes: 0.31 10*3/uL — ABNORMAL HIGH (ref 0.00–0.07)
Basophils Absolute: 0.1 10*3/uL (ref 0.0–0.1)
Basophils Relative: 1 %
Eosinophils Absolute: 0.4 10*3/uL (ref 0.0–0.5)
Eosinophils Relative: 5 %
HCT: 36.8 % (ref 36.0–46.0)
Hemoglobin: 12.1 g/dL (ref 12.0–15.0)
Immature Granulocytes: 4 %
LYMPHS ABS: 2.7 10*3/uL (ref 0.7–4.0)
Lymphocytes Relative: 32 %
MCH: 30.7 pg (ref 26.0–34.0)
MCHC: 32.9 g/dL (ref 30.0–36.0)
MCV: 93.4 fL (ref 80.0–100.0)
Monocytes Absolute: 0.2 10*3/uL (ref 0.1–1.0)
Monocytes Relative: 3 %
NRBC: 0 % (ref 0.0–0.2)
Neutro Abs: 4.9 10*3/uL (ref 1.7–7.7)
Neutrophils Relative %: 55 %
Platelets: 217 10*3/uL (ref 150–400)
RBC: 3.94 MIL/uL (ref 3.87–5.11)
RDW: 12.4 % (ref 11.5–15.5)
WBC: 8.5 10*3/uL (ref 4.0–10.5)

## 2018-12-03 MED ORDER — PALONOSETRON HCL INJECTION 0.25 MG/5ML
INTRAVENOUS | Status: AC
  Filled 2018-12-03: qty 5

## 2018-12-03 MED ORDER — SODIUM CHLORIDE 0.9% FLUSH
10.0000 mL | INTRAVENOUS | Status: DC | PRN
  Administered 2018-12-03: 10 mL
  Filled 2018-12-03: qty 10

## 2018-12-03 MED ORDER — SODIUM CHLORIDE 0.9 % IV SOLN
Freq: Once | INTRAVENOUS | Status: AC
  Administered 2018-12-03: 15:00:00 via INTRAVENOUS
  Filled 2018-12-03: qty 250

## 2018-12-03 MED ORDER — DEXAMETHASONE SODIUM PHOSPHATE 10 MG/ML IJ SOLN
INTRAMUSCULAR | Status: AC
  Filled 2018-12-03: qty 1

## 2018-12-03 MED ORDER — SODIUM CHLORIDE 0.9 % IV SOLN
253.2000 mg | Freq: Once | INTRAVENOUS | Status: AC
  Administered 2018-12-03: 250 mg via INTRAVENOUS
  Filled 2018-12-03: qty 25

## 2018-12-03 MED ORDER — SODIUM CHLORIDE 0.9 % IV SOLN
2000.0000 mg | Freq: Once | INTRAVENOUS | Status: AC
  Administered 2018-12-03: 2000 mg via INTRAVENOUS
  Filled 2018-12-03: qty 52.6

## 2018-12-03 MED ORDER — HEPARIN SOD (PORK) LOCK FLUSH 100 UNIT/ML IV SOLN
500.0000 [IU] | Freq: Once | INTRAVENOUS | Status: AC | PRN
  Administered 2018-12-03: 500 [IU]
  Filled 2018-12-03: qty 5

## 2018-12-03 MED ORDER — PALONOSETRON HCL INJECTION 0.25 MG/5ML
0.2500 mg | Freq: Once | INTRAVENOUS | Status: AC
  Administered 2018-12-03: 0.25 mg via INTRAVENOUS

## 2018-12-03 MED ORDER — DEXAMETHASONE SODIUM PHOSPHATE 10 MG/ML IJ SOLN
10.0000 mg | Freq: Once | INTRAMUSCULAR | Status: AC
  Administered 2018-12-03: 10 mg via INTRAVENOUS

## 2018-12-03 NOTE — Patient Instructions (Signed)
[facility] Discharge Instructions for Patients Receiving Chemotherapy  Today you received the following chemotherapy agents Gemcitabine (GEMZAR) & Carboplatin (PARAPLATIN).  To help prevent nausea and vomiting after your treatment, we encourage you to take your nausea medication as prescribed.   If you develop nausea and vomiting that is not controlled by your nausea medication, call the clinic.   BELOW ARE SYMPTOMS THAT SHOULD BE REPORTED IMMEDIATELY:  *FEVER GREATER THAN 100.5 F  *CHILLS WITH OR WITHOUT FEVER  NAUSEA AND VOMITING THAT IS NOT CONTROLLED WITH YOUR NAUSEA MEDICATION  *UNUSUAL SHORTNESS OF BREATH  *UNUSUAL BRUISING OR BLEEDING  TENDERNESS IN MOUTH AND THROAT WITH OR WITHOUT PRESENCE OF ULCERS  *URINARY PROBLEMS  *BOWEL PROBLEMS  UNUSUAL RASH Items with * indicate a potential emergency and should be followed up as soon as possible.  Feel free to call the clinic should you have any questions or concerns. The clinic phone number is [phone].  Please show the CHEMO ALERT CARD at check-in to the Emergency Department and triage nurse.   

## 2018-12-13 NOTE — Progress Notes (Signed)
[place]  Telephone:([phone] Fax:(336) [phone]    ID: Alexandria Bradley OB: 01/23/1961  MR#: [id number]  DHR#:[id number]  Patient Care Team: [name], DO as PCP - General (Family Medicine) Josue Hector, MD as PCP - Cardiology (Cardiology) Reni Hausner, Virgie Dad, MD as Consulting Physician (Oncology) Brien Few, MD as Consulting Physician (Obstetrics and Gynecology) [name], MD as Consulting Physician (General Surgery) [name], MD as Referring Physician (Radiation Oncology) Delrae Rend, MD as Consulting Physician (Endocrinology) Wilford Corner, MD as Consulting Physician (Gastroenterology) Dorna Leitz, MD as Consulting Physician (Orthopedic Surgery) Josue Hector, MD as Consulting Physician (Cardiology) OTHER MD:    CHIEF COMPLAINT: Triple negative breast cancer, locally recurrent  CURRENT TREATMENT: Neoadjuvant chemotherapy   INTERVAL HISTORY: Alexandria Bradley returns today for follow-up and treatment of her locally recurrent triple negative breast cancer. She is accompanied by her husband.  She continues on neoadjuvant chemotherapy consiting of carboplatin and gemcitabine given days 1 and 8 of each 21-day cycle, x6. Today is day 19 cycle 1. She experiencing fatigue, mild nausea, and abdominal cramping over her stomach, with hearburn. She also had edema in her face, ankles and legs without erythema, she has developed some acne on her face as well. She has night diaphoresis.    Since her last visit here, she has not undergone any additional studies.     REVIEW OF SYSTEMS: Alexandria Bradley still has her hair, but it is thinning; she has a wig prepared for when she loses her hair. She is having some difficulty sleeping; she states that she stopped her metformin, but she feels like she is swelling and having some difficulty catching her breath at night since she discontinued this medication. She also notes that she feels "shakey"  sometimes, like her blood sugar isn't dropping. She has headaches due to sinus issues, for which she takes Tylenol Sinus, and Claritin to treat, with some relief. She does not have a formal exercise routine, but she does continue to do her housework.  The patient denies unusual headaches, visual changes, or vomiting. There has been no unusual cough, phlegm production, or pleurisy. This been no change in bowel or bladder habits. The patient denies unexplained weight loss, bleeding, or fever. A detailed review of systems was otherwise noncontributory.    BREAST CANCER HISTORY: From the original intake note of 02/16/2014:  Alexandria Bradley palpated a mass in her left breast early May and brought it immediately to her gynecologist attention. He set her up for bilateral diagnostic mammography and left ultrasonography at [facility] 02/07/2014. Mammography showed a 1.3 cm oval mass with spiculated margins in the left breast at the 11:00 position. This was palpable. Ultrasound showed a 1.1 cm tall her than wide mass with a microlobulated margins. He was hypoechoic. There were no abnormalities noted in the left axilla.  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 Lyndon Code at conference 02/16/2014), triple negative, with an MIB-1 of 50%.  The patient's subsequent history is as detailed below   PAST MEDICAL HISTORY: Past Medical History:  Diagnosis Date   Anxiety    Arthritis    Back pain    Breast cancer ([place])    left   Dyspnea    Fatty liver    GERD (gastroesophageal reflux disease)    Headache(784.0)    PMH : migraines   History of kidney stones    Hypercholesterolemia  Hypertension    Hypothyroidism    Kidney stones    Osteoporosis    Personal history of chemotherapy    Personal history of radiation therapy    PONV (postoperative nausea and vomiting)    difficult to  wake up   Pre-diabetes    Psoriasis    PVC's (premature ventricular contractions)    Radiation 08/18/14-10/07/14   Left breast    PAST SURGICAL HISTORY: Past Surgical History:  Procedure Laterality Date   BREAST BIOPSY Left 11/10/2018   BREAST LUMPECTOMY Left 2015   BREAST LUMPECTOMY WITH AXILLARY LYMPH NODE BIOPSY  6/15   left   CARDIAC CATHETERIZATION     CHOLECYSTECTOMY     COLONOSCOPY N/A 06/27/2014   Procedure: COLONOSCOPY;  Surgeon: Lear Ng, MD;  Location: [place];  Service: Endoscopy;  Laterality: N/A;   LEFT HEART CATH AND CORONARY ANGIOGRAPHY N/A 07/24/2018   Procedure: LEFT HEART CATH AND CORONARY ANGIOGRAPHY;  Surgeon: Troy Sine, MD;  Location: [place] [facility];  Service: Cardiovascular;  Laterality: N/A;   LIPOMA EXCISION     MYOMECTOMY     PORT-A-CATH REMOVAL N/A 02/03/2015   Procedure: REMOVAL PORT-A-CATH;  Surgeon: [name], MD;  Location: [place];  Service: General;  Laterality: N/A;   PORTACATH PLACEMENT N/A 03/01/2014   Procedure: INSERTION PORT-A-CATH;  Surgeon: [name], MD;  Location: [place];  Service: General;  Laterality: N/A;   PORTACATH PLACEMENT N/A 11/18/2018   Procedure: INSERTION PORT-A-CATH WITH ULTRASOUND;  Surgeon: [name], MD;  Location: [place];  Service: General;  Laterality: N/A;   TONSILLECTOMY     TUBAL LIGATION      FAMILY HISTORY: Family History  Problem Relation Age of Onset   Endometrial cancer Mother 61   Hearing loss Mother    Atrial fibrillation Mother    Lung cancer Father    Brain cancer Father    Heart disease Maternal Aunt    Atrial fibrillation Maternal Aunt    Lung cancer Maternal Uncle    Atrial fibrillation Maternal Uncle    Stroke Maternal Grandmother    Stroke Maternal Grandfather    Bladder Cancer Maternal Uncle    Multiple myeloma Maternal Uncle    Other Son        trisomy 32   The patient's father  died at the age of 58 from what may have been metastatic lung cancer. The patient knows very little about the father's side of her family. Her mother is alive at age 65. She was recently diagnosed with endometrial cancer. The patient has one brother, 2 sisters. There is no history of breast or ovarian cancer in the family.   GYNECOLOGIC HISTORY:  Menarche age 72, first live birth age 37. The patient is GX P3. One child died shortly after birth. She stopped having periods approximately 2005. She did not use hormone replacement. 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 Son [name] is  Nurse, adult in [place]. Daughter [name] lives in [place] and works for [employer] as an Therapist, sports. She has three grandchildren.    ADVANCED DIRECTIVES: Not in place   HEALTH MAINTENANCE: Social History   Tobacco Use   Smoking status: Former Smoker    Packs/day:  0.50    Years: 15.00    Pack years: 7.50    Types: Cigarettes   Smokeless tobacco: Never Used   Tobacco comment: Quit smoking cigarettes in 2002  Substance Use Topics   Alcohol use: Yes    Comment: social   Drug use: No     Colonoscopy: 2010  PAP: 2013  Bone density: 05/15/2009, at [facility], normal, lowest T score -0.8  Lipid panel:  Allergies  Allergen Reactions   Chlorhexidine Itching   Ciprofloxacin Anxiety, Rash and Other (See Comments)    GI upset   Prednisone Shortness Of Breath and Other (See Comments)    "jacks me up" jittery    Codeine Nausea Only   Cortisone Swelling and Other (See Comments)    "jack me up"     Current Outpatient Medications  Medication Sig Dispense Refill   acetaminophen (TYLENOL) 500 MG tablet Take 1,000 mg by  mouth at bedtime as needed for headache.      betamethasone dipropionate (DIPROLENE) 0.05 % cream Apply 1 application topically daily as needed (psoriasis).     bismuth subsalicylate (PEPTO BISMOL) 262 MG chewable tablet Chew 524 mg by mouth as needed for indigestion.     dexamethasone (DECADRON) 4 MG tablet Take 2 tablets (8 mg total) by mouth daily. Start the day after chemotherapy for 2 days. Take with food. 30 tablet 1   diltiazem (CARDIZEM CD) 240 MG 24 hr capsule Take 1 capsule (240 mg total) by mouth daily. 90 capsule 3   fluticasone (FLONASE) 50 MCG/ACT nasal spray SPRAY 2 SPRAYS INTO EACH NOSTRIL EVERY DAY (Patient taking differently: Place 1 spray into both nostrils daily as needed for allergies. ) 48 g 1   lidocaine-prilocaine (EMLA) cream Apply to affected area once 30 g 3   liothyronine (CYTOMEL) 5 MCG tablet Take 5 mcg by mouth daily before breakfast.      loratadine (CLARITIN) 10 MG tablet Take 10 mg by mouth at bedtime.      metFORMIN (GLUCOPHAGE-XR) 500 MG 24 hr tablet Take 1,000 mg by mouth every evening.     Phenylephrine-Acetaminophen (TYLENOL SINUS+HEADACHE PO) Take 1 tablet by mouth daily as needed (sinus headaches).     prochlorperazine (COMPAZINE) 10 MG tablet Take 1 tablet (10 mg total) by mouth every 6 (six) hours as needed (Nausea or vomiting). 30 tablet 1   propranolol (INDERAL) 10 MG tablet Take 10 mg by mouth daily as needed (heart palpitations).      TIROSINT 100 MCG CAPS Take 100 mcg by mouth daily before breakfast.   4   triamterene-hydrochlorothiazide (MAXZIDE-25) 37.5-25 MG per tablet Take 1 tablet by mouth daily.      No current facility-administered medications for this visit.     OBJECTIVE: Middle-aged white woman in no acute distress Vitals:   12/14/18 0807  BP: 136/76  Pulse: 71  Resp: 18  Temp: 98.2 F (36.8 C)  SpO2: 98%     Body mass index is 31.18 kg/m.    ECOG FS:1 - Symptomatic but completely ambulatory  Sclerae unicteric,  EOMs intact Oropharynx clear and moist No cervical or supraclavicular adenopathy Lungs no rales or rhonchi Heart regular rate and rhythm Abd soft, nontender, positive bowel sounds MSK no focal spinal tenderness, no upper extremity lymphedema Neuro: nonfocal, well oriented, appropriate affect Breasts: The right breast is unremarkable.  Left breast is status post prior lumpectomy.  Both axillae are benign   LAB RESULTS: No results found for: SPEP  Lab Results  Component Value Date   WBC 8.5 12/03/2018   NEUTROABS 4.9 12/03/2018   HGB 12.1 12/03/2018   HCT 36.8 12/03/2018   MCV 93.4 12/03/2018   PLT 217 12/03/2018      Chemistry      Component Value Date/Time   NA 136 12/03/2018 1340   NA 140 06/19/2017 1312   K 3.9 12/03/2018 1340   K 3.5 06/19/2017 1312   CL 102 12/03/2018 1340   CO2 27 12/03/2018 1340   CO2 25 06/19/2017 1312   BUN 13 12/03/2018 1340   BUN 18.4 06/19/2017 1312   CREATININE 0.67 12/03/2018 1340   CREATININE 0.8 06/19/2017 1312      Component Value Date/Time   CALCIUM 8.8 (L) 12/03/2018 1340   CALCIUM 10.2 06/19/2017 1312   ALKPHOS 62 12/03/2018 1340   ALKPHOS 86 06/19/2017 1312   AST 34 12/03/2018 1340   AST 15 06/19/2017 1312   ALT 55 (H) 12/03/2018 1340   ALT 22 06/19/2017 1312   BILITOT 0.2 (L) 12/03/2018 1340   BILITOT 0.26 06/19/2017 1312       No results found for: LABCA2  No components found for: LABCA125  No results for input(s): INR in the last 168 hours.  Urinalysis    Component Value Date/Time   COLORURINE YELLOW 03/13/2009 2251   APPEARANCEUR CLOUDY (A) 03/13/2009 2251   LABSPEC 1.020 06/15/2014 1101   PHURINE 6.0 06/15/2014 1101   PHURINE 5.5 03/13/2009 2251   GLUCOSEU Negative 06/15/2014 1101   HGBUR Negative 06/15/2014 1101   HGBUR NEGATIVE 03/13/2009 2251   BILIRUBINUR Color Interference 06/15/2014 1101   KETONESUR Negative 06/15/2014 1101   KETONESUR NEGATIVE 03/13/2009 2251   PROTEINUR 30 06/15/2014 1101    PROTEINUR NEGATIVE 03/13/2009 2251   UROBILINOGEN 0.2 06/15/2014 1101   NITRITE Negative 06/15/2014 1101   NITRITE NEGATIVE 03/13/2009 2251   LEUKOCYTESUR Trace 06/15/2014 1101    STUDIES: Mr Thoracic Spine W Wo Contrast  Result Date: 11/23/2018 CLINICAL DATA:  Restaging breast ca. eval for possible uptake seen on bone scan in pacs. Pt extremely claustrophobic. EXAM: MRI THORACIC WITHOUT AND WITH CONTRAST TECHNIQUE: Multiplanar and multiecho pulse sequences of the thoracic spine were obtained without and with intravenous contrast. CONTRAST:  8 ml Gadavist COMPARISON:  Bone scan 11/13/2018 FINDINGS: MRI THORACIC SPINE FINDINGS Alignment:  Physiologic. Vertebrae: No fracture, evidence of discitis, or bone lesion. Cord:  Normal signal and morphology. Paraspinal and other soft tissues: No acute paraspinal abnormality. Disc levels: Disc spaces: Degenerative disc disease with disc height loss at T10-11 and T11-12. T1-T2: No disc protrusion, foraminal stenosis or central canal stenosis. T2-T3: No disc protrusion, foraminal stenosis or central canal stenosis. T3-T4: No disc protrusion, foraminal stenosis or central canal stenosis. T4-T5: No disc protrusion, foraminal stenosis or central canal stenosis. T5-T6: No disc protrusion, foraminal stenosis or central canal stenosis. T6-T7: No disc protrusion, foraminal stenosis or central canal stenosis. T7-T8: No disc protrusion, foraminal stenosis or central canal stenosis. T8-T9: No disc protrusion, foraminal stenosis or central canal stenosis. T9-T10: No disc protrusion, foraminal stenosis or central canal stenosis. Left facet arthropathy at T9-10. T10-T11: No disc protrusion, foraminal stenosis or central canal stenosis. Left facet arthropathy. T11-T12: No disc protrusion, foraminal stenosis or central canal stenosis. IMPRESSION: 1. No aggressive osseous lesion of the thoracic spine to suggest metastatic disease. Electronically Signed   By: Kathreen Devoid   On:  11/23/2018 13:05   Mr Breast Bilateral [place] Cad  Result Date: 11/20/2018 CLINICAL  DATA:  58 year old female who presented in early February of 2020 with increasing fullness at her lumpectomy site (original lumpectomy in 2015) in the upper inner left breast. Ultrasound-guided biopsy was performed of a mass at the lumpectomy site, demonstrating grade 2-3 invasive ductal carcinoma. LABS:  Creatinine of 0.8 mg/dL and GFR greater than 60 on 11/19/2018. EXAM: BILATERAL BREAST MRI WITH AND WITHOUT CONTRAST TECHNIQUE: Multiplanar, multisequence MR images of both breasts were obtained prior to and following the intravenous administration of 8 ml of Gadavist Three-dimensional MR images were rendered by post-processing of the original MR data on an independent workstation. The three-dimensional MR images were interpreted, and findings are reported in the following complete MRI report for this study. Three dimensional images were evaluated at the independent DynaCad workstation COMPARISON:  No prior MRI available for comparison. Correlation made with prior mammograms and ultrasounds. FINDINGS: Breast composition: c. Heterogeneous fibroglandular tissue. Background parenchymal enhancement: Moderate. Right breast: A Port-A-Cath is seen in the upper inner right breast. No mass or abnormal enhancement. Left breast: In the upper inner quadrant of the left breast, posterior depth, there is an enhancing irregular spiculated mass measuring 1.7 x 1.6 x 1.4 cm (series 10506, image 21). This corresponds with the biopsy proven site of malignancy. Lymph nodes: No abnormal appearing lymph nodes. Ancillary findings:  None. IMPRESSION: 1. The known malignancy in the upper inner quadrant of the left breast at the patient's lumpectomy site measures 1.7 cm. 2.  No evidence of malignancy in the right breast. RECOMMENDATION: Continue treatment plan for known left breast cancer. BI-RADS CATEGORY  6: Known biopsy-proven malignancy.  Electronically Signed   By: Ammie Ferrier M.D.   On: 11/20/2018 16:00   Dg Chest Port 1 View  Result Date: 11/18/2018 CLINICAL DATA:  Port-A-Cath placement. EXAM: PORTABLE CHEST 1 VIEW COMPARISON:  CT chest 11/12/2018. FINDINGS: Heart size is exaggerated by low lung volumes. Surgical clips are present in the left breast. There is mild exaggeration of the pulmonary interstitium, likely due to lung the lung volumes. No focal airspace disease is present. A right IJ Port-A-Cath is in place. The tip is in the mid SVC. There is no pneumothorax. IMPRESSION: 1. Interval placement of right IJ Port-A-Cath without radiographic evidence for complication. 2. Borderline cardiomegaly without failure. Electronically Signed   By: San Morelle M.D.   On: 11/18/2018 16:50   Dg Fluoro Guide Cv Line-no Report  Result Date: 11/18/2018 Fluoroscopy was utilized by the requesting physician.  No radiographic interpretation.   Mm Diag Breast Tomo Uni Right  Result Date: 11/24/2018 CLINICAL DATA:  Recurrence of malignancy in the left breast. Right-sided mammography prior to treatment. EXAM: DIGITAL DIAGNOSTIC UNILATERAL RIGHT MAMMOGRAM WITH CAD AND TOMO COMPARISON:  Previous exam(s). ACR Breast Density Category b: There are scattered areas of fibroglandular density. FINDINGS: No suspicious masses, calcifications, or distortion. The patient's right port site is identified. There are post surgical changes around the port. Mammographic images were processed with CAD. IMPRESSION: No mammographic evidence of malignancy in the right breast. RECOMMENDATION: Continued surgical follow up for the left-sided recurrence. Annual mammography on the right. I have discussed the findings and recommendations with the patient. Results were also provided in writing at the conclusion of the visit. If applicable, a reminder letter will be sent to the patient regarding the next appointment. BI-RADS CATEGORY  2: Benign. Electronically Signed    By: Dorise Bullion III M.D   On: 11/24/2018 11:55    ASSESSMENT: 58 y.o. [place] woman status post  left breast upper inner quadrant biopsy 02/08/2014 for a clinical T1c N0, stage IA invasive ductal carcinoma, grade 3, triple negative, with an MIB-1 of 50%.  (1) status post left lumpectomy and sentinel lymph node sampling 03/01/2014 for a pT1c pN0, stage IA invasive ductal carcinoma, grade 3, with negative margins, repeat prognostic panel again triple negative.  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  Left breast with breath hold technique/ 45 Gy at 1.8 Gy per fraction x 25 fractions.   Left breast boost/ 16 Gy at 2 Gy per fraction x 8 fractions  (4) genetics counseling 07/06/2015 through the Breast/Ovarian cancer gene panel offered by [facility] found no deleterious mutations in ATM, BARD1, BRCA1, BRCA2, BRIP1, CDH1, CHEK2, EPCAM, FANCC, MLH1, MSH2, MSH6, NBN, PALB2, PMS2, PTEN, RAD51C, RAD51D, TP53, and XRCC2.  (5) question of sleep apnea  (6) transverse colon lesion noted on CT scan from 06/17/2014 - negative biopsy, also showed fatty liver  (a) repeat CT scan of the abdomen and pelvis with contrast 02/15/2016 negative  RECURRENT DISEASE: February 2020 (7) biopsy of palpable mass upper inner left breast 11/10/2018 confirms invasive ductal carcinoma, grade 2 or 3, triple negative, with an MIB-1 of 80%.  (a) CT scans of the chest abdomen and pelvis with contrast showed no stage IV disease  (b) bone scan 11/13/2018 shows no definite metastasis to  bone  (8) neoadjuvant chemotherapy will consist of carboplatin and gemcitabine given days 1 and 8 of each 21-day cycle, for 6 cycles, started 11/26/2018  (9) bilateral mastectomies with left sentinel lymph node sampling and immediate reconstruction to follow   PLAN: Jackson tolerated her first cycle of carboplatin and gemcitabine generally well.  She is having more symptoms from the dexamethasone than anything else.  Likely she had very little nausea.  Accordingly we are going to drop the dexamethasone dose at home from 8 mg twice daily to 4 mg twice daily.  I will also drop the premed dose before each cycle.  She was not scheduled for day 8 treatment and that has been corrected.  I am also putting her into see me on day 1 of each cycle  She did experience some reflux symptoms.  She is going to take Prilosec 20 mg daily at bedtime beginning on day 1 of each chemotherapy cycle and continuing at least 10 days.  It is not clear to me why her taking metformin would help swelling and insomnia but she says it did.  She is taking Claritin for her sinus headaches. 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  We discussed symptoms of viral illnesses including coronavirus and if she develops any of those symptoms she will call  Otherwise the plan is to continue treatment as planned.  We will see me again in 21 days but knows to call for any other problems that may develop before the next visit. Vito Beg, Virgie Dad, MD  12/14/18 8:38 AM Medical Oncology and Hematology [facility] [address] [place], [place] [address] Tel. [phone]    Fax.  [phone]  I, Jacqualyn Posey am acting as a Education administrator for Chauncey Cruel, MD.   I, Lurline Del MD, have reviewed the above documentation for accuracy and completeness, and I agree with the above.

## 2018-12-14 ENCOUNTER — Other Ambulatory Visit: Payer: Self-pay

## 2018-12-14 ENCOUNTER — Inpatient Hospital Stay (HOSPITAL_BASED_OUTPATIENT_CLINIC_OR_DEPARTMENT_OTHER): Payer: No Typology Code available for payment source | Admitting: Oncology

## 2018-12-14 VITALS — BP 136/76 | HR 71 | Temp 98.2°F | Resp 18 | Ht 65.0 in | Wt 187.4 lb

## 2018-12-14 DIAGNOSIS — Z9013 Acquired absence of bilateral breasts and nipples: Secondary | ICD-10-CM

## 2018-12-14 DIAGNOSIS — Z87891 Personal history of nicotine dependence: Secondary | ICD-10-CM

## 2018-12-14 DIAGNOSIS — E7800 Pure hypercholesterolemia, unspecified: Secondary | ICD-10-CM

## 2018-12-14 DIAGNOSIS — Z171 Estrogen receptor negative status [ER-]: Secondary | ICD-10-CM | POA: Diagnosis not present

## 2018-12-14 DIAGNOSIS — Z923 Personal history of irradiation: Secondary | ICD-10-CM

## 2018-12-14 DIAGNOSIS — C50212 Malignant neoplasm of upper-inner quadrant of left female breast: Secondary | ICD-10-CM

## 2018-12-14 DIAGNOSIS — E039 Hypothyroidism, unspecified: Secondary | ICD-10-CM

## 2018-12-14 DIAGNOSIS — I10 Essential (primary) hypertension: Secondary | ICD-10-CM

## 2018-12-14 DIAGNOSIS — Z7984 Long term (current) use of oral hypoglycemic drugs: Secondary | ICD-10-CM

## 2018-12-14 DIAGNOSIS — Z79899 Other long term (current) drug therapy: Secondary | ICD-10-CM

## 2018-12-14 DIAGNOSIS — F419 Anxiety disorder, unspecified: Secondary | ICD-10-CM

## 2018-12-14 DIAGNOSIS — C50912 Malignant neoplasm of unspecified site of left female breast: Secondary | ICD-10-CM

## 2018-12-14 DIAGNOSIS — Z5111 Encounter for antineoplastic chemotherapy: Secondary | ICD-10-CM | POA: Diagnosis not present

## 2018-12-14 NOTE — Progress Notes (Deleted)
CARDIOLOGY OFFICE NOTE  Date:  12/14/2018    Alexandria Bradley Date of Birth: 07-27-1961 Medical Record #[id number]  PCP:  [name], DO  Cardiologist:  Johnsie Cancel    No chief complaint on file.   History of Present Illness:  58 y.o. with a history of palpitations with prior negative monitor and normal stress echo. Has had prior breast cancer with chemo with follow up echo showing normal EF. Other issues include CKD, HTN, DM and HLD. I have not seen her since 2018  Has followed with Cecilie Kicks, NP over the past few years. In October 2019  she endorsed more palpitations and chest pain with SOB. She ended up having cardiac cath - normal coronaries noted. Echo updated and this was stable except for mild/moderate reduction in RV function.  Beta blocker causes fatigue   Palpitations improved monitor 07/28/18 only rare PVC;s   Has had local recurrence of breast cancer Getting chemo follows with Mag[facility] ***  Past Medical History:  Diagnosis Date   Anxiety    Arthritis    Back pain    Breast cancer ([place])    left   Dyspnea    Fatty liver    GERD (gastroesophageal reflux disease)    Headache(784.0)    PMH : migraines   History of kidney stones    Hypercholesterolemia    Hypertension    Hypothyroidism    Kidney stones    Osteoporosis    Personal history of chemotherapy    Personal history of radiation therapy    PONV (postoperative nausea and vomiting)    difficult to wake up   Pre-diabetes    Psoriasis    PVC's (premature ventricular contractions)    Radiation 08/18/14-10/07/14   Left breast    Past Surgical History:  Procedure Laterality Date   BREAST BIOPSY Left 11/10/2018   BREAST LUMPECTOMY Left 2015   BREAST LUMPECTOMY WITH AXILLARY LYMPH NODE BIOPSY  6/15   left   CARDIAC CATHETERIZATION     CHOLECYSTECTOMY     COLONOSCOPY N/A 06/27/2014   Procedure: COLONOSCOPY;  Surgeon: Lear Ng, MD;  Location: [place];  Service: Endoscopy;  Laterality: N/A;   LEFT HEART CATH AND CORONARY ANGIOGRAPHY N/A 07/24/2018   Procedure: LEFT HEART CATH AND CORONARY ANGIOGRAPHY;  Surgeon: Troy Sine, MD;  Location: [place] [facility];  Service: Cardiovascular;  Laterality: N/A;   LIPOMA EXCISION     MYOMECTOMY     PORT-A-CATH REMOVAL N/A 02/03/2015   Procedure: REMOVAL PORT-A-CATH;  Surgeon: [name], MD;  Location: [place];  Service: General;  Laterality: N/A;   PORTACATH PLACEMENT N/A 03/01/2014   Procedure: INSERTION PORT-A-CATH;  Surgeon: [name], MD;  Location: [place];  Service: General;  Laterality: N/A;   PORTACATH PLACEMENT N/A 11/18/2018   Procedure: INSERTION PORT-A-CATH WITH ULTRASOUND;  Surgeon: [name], MD;  Location: [place];  Service: General;  Laterality: N/A;   TONSILLECTOMY     TUBAL LIGATION       Medications: No outpatient medications have been marked as taking for the 12/18/18 encounter (Appointment) with Josue Hector, MD.     Allergies: Allergies  Allergen Reactions   Chlorhexidine Itching   Ciprofloxacin Anxiety, Rash and Other (See Comments)    GI upset   Prednisone Shortness Of Breath and Other (See Comments)    "jacks me up" jittery    Codeine Nausea Only   Cortisone Swelling and Other (See  Comments)    "jack me up"     Social History: The patient  reports that she has quit smoking. Her smoking use included cigarettes. She has a 7.50 pack-year smoking history. She has never used smokeless tobacco. She reports current alcohol use. She reports that she does not use drugs.   Family History: The patient's family history includes Atrial fibrillation in her maternal aunt, maternal uncle, and mother; Bladder Cancer in her maternal uncle; Brain cancer in her father; Endometrial cancer (age of onset: 74) in her mother; Hearing loss in her mother; Heart disease in her maternal aunt; Lung cancer in  her father and maternal uncle; Multiple myeloma in her maternal uncle; Other in her son; Stroke in her maternal grandfather and maternal grandmother.   Review of Systems: Please see the history of present illness.   Otherwise, the review of systems is positive for none.   All other systems are reviewed and negative.   Physical Exam: VS:  There were no vitals taken for this visit. Marland Kitchen  BMI There is no height or weight on file to calculate BMI.  Wt Readings from Last 3 Encounters:  12/14/18 85 kg  12/03/18 87 kg  11/26/18 85.9 kg   Affect appropriate Healthy:  appears stated age 89: normal Neck supple with no adenopathy JVP normal no bruits no thyromegaly Lungs clear with no wheezing and good diaphragmatic motion Heart:  S1/S2 no murmur, no rub, gallop or click PMI normal Post bilateral mastectomy  Abdomen: benighn, BS positve, no tenderness, no AAA no bruit.  No HSM or HJR Distal pulses intact with no bruits No edema Neuro non-focal Skin warm and dry No muscular weakness    LABORATORY DATA:  EKG:  SR rate 73 normal 08/06/18  Lab Results  Component Value Date   WBC 8.5 12/03/2018   HGB 12.1 12/03/2018   HCT 36.8 12/03/2018   PLT 217 12/03/2018   GLUCOSE 78 12/03/2018   CHOL 243 (HH) 01/12/2008   TRIG 198 (H) 01/12/2008   HDL 38.4 (L) 01/12/2008   LDLDIRECT 175.2 01/12/2008   ALT 55 (H) 12/03/2018   AST 34 12/03/2018   NA 136 12/03/2018   K 3.9 12/03/2018   CL 102 12/03/2018   CREATININE 0.67 12/03/2018   BUN 13 12/03/2018   CO2 27 12/03/2018   TSH 1.78 07/10/2018     BNP (last 3 results) No results for input(s): BNP in the last 8760 hours.  ProBNP (last 3 results) No results for input(s): PROBNP in the last 8760 hours.   Other Studies Reviewed Today:  Event Monitor 06/2018 Study Highlights   NSR Isolated PVCls  No significant NSVT Subjective feeling of skips and fluttering does usually  Correlate with PVCls      Cardiac cath 07/25/18 Normal  coronary arteries in a dominant RCA system.  LVEDP 10 mm Hg.  RECOMMENDATION: Suspect noncardiac etiology to the patient's chest tightness. Consider echo Doppler study to assess LV function.  No indication for antiplatelet therapy at this time.  Echo 07/28/18 Study Conclusions  - Left ventricle: The cavity size was normal. Wall thickness was normal. Systolic function was normal. The estimated ejection fraction was in the range of 55% to 60%. Wall motion was normal; there were no regional wall motion abnormalities. Doppler parameters are consistent with abnormal left ventricular relaxation (grade 1 diastolic dysfunction). - Right ventricle: Systolic function was mildly to moderately reduced.    Assessment/Plan:  1.  Palpitations - improved fatigue with Toprol on cardizem  monitor benign PVC;s   2. HTN -  Well controlled.  Continue current medications and low sodium Dash type diet.    3. Hypothyroid  TSH normal 07/10/18 continue replacement Rx f/u primary   Discussed low carb diet.  Target hemoglobin A1c is 6.5 or less.  Continue current medications.  4. Breast Cancer. F/u Magrinat locally recurrent 11/26/18 continue neoadjuvant chemo  Current medicines are reviewed with the patient today.  The patient does not have concerns regarding medicines other than what has been noted above.  The following changes have been made:  See above.  Labs/ tests ordered today include:   No orders of the defined types were placed in this encounter.    Disposition:   FU with cardiology PRN   Signed: Jenkins Rouge, MD  12/14/2018 1:25 PM  [facility] [address] [place], [place]  [address] Phone: [phone] Fax: [phone]

## 2018-12-15 ENCOUNTER — Other Ambulatory Visit: Payer: Self-pay

## 2018-12-15 MED ORDER — TRIAMTERENE-HCTZ 37.5-25 MG PO TABS: 1.0000 | ORAL_TABLET | Freq: Every day | ORAL | 3 refills | Status: DC

## 2018-12-15 NOTE — Progress Notes (Signed)
Re-filled patient's medication.

## 2018-12-17 ENCOUNTER — Inpatient Hospital Stay: Payer: No Typology Code available for payment source

## 2018-12-17 ENCOUNTER — Other Ambulatory Visit: Payer: Self-pay

## 2018-12-17 VITALS — BP 147/75 | HR 74 | Temp 98.3°F | Resp 18

## 2018-12-17 DIAGNOSIS — Z95828 Presence of other vascular implants and grafts: Secondary | ICD-10-CM

## 2018-12-17 DIAGNOSIS — Z5111 Encounter for antineoplastic chemotherapy: Secondary | ICD-10-CM | POA: Diagnosis not present

## 2018-12-17 DIAGNOSIS — C50912 Malignant neoplasm of unspecified site of left female breast: Secondary | ICD-10-CM

## 2018-12-17 DIAGNOSIS — C50212 Malignant neoplasm of upper-inner quadrant of left female breast: Secondary | ICD-10-CM

## 2018-12-17 DIAGNOSIS — Z171 Estrogen receptor negative status [ER-]: Principal | ICD-10-CM

## 2018-12-17 LAB — CBC WITH DIFFERENTIAL/PLATELET
Abs Immature Granulocytes: 0.04 10*3/uL (ref 0.00–0.07)
Basophils Absolute: 0 10*3/uL (ref 0.0–0.1)
Basophils Relative: 0 %
Eosinophils Absolute: 0.1 10*3/uL (ref 0.0–0.5)
Eosinophils Relative: 2 %
HCT: 36.6 % (ref 36.0–46.0)
Hemoglobin: 11.8 g/dL — ABNORMAL LOW (ref 12.0–15.0)
Immature Granulocytes: 1 %
Lymphocytes Relative: 31 %
Lymphs Abs: 1.6 10*3/uL (ref 0.7–4.0)
MCH: 30.6 pg (ref 26.0–34.0)
MCHC: 32.2 g/dL (ref 30.0–36.0)
MCV: 95.1 fL (ref 80.0–100.0)
Monocytes Absolute: 0.6 10*3/uL (ref 0.1–1.0)
Monocytes Relative: 12 %
NEUTROS ABS: 2.8 10*3/uL (ref 1.7–7.7)
NEUTROS PCT: 54 %
PLATELETS: 366 10*3/uL (ref 150–400)
RBC: 3.85 MIL/uL — ABNORMAL LOW (ref 3.87–5.11)
RDW: 13.3 % (ref 11.5–15.5)
WBC: 5.1 10*3/uL (ref 4.0–10.5)
nRBC: 0 % (ref 0.0–0.2)

## 2018-12-17 LAB — COMPREHENSIVE METABOLIC PANEL
ALT: 64 U/L — ABNORMAL HIGH (ref 0–44)
AST: 31 U/L (ref 15–41)
Albumin: 3.9 g/dL (ref 3.5–5.0)
Alkaline Phosphatase: 64 U/L (ref 38–126)
Anion gap: 12 (ref 5–15)
BUN: 12 mg/dL (ref 6–20)
CO2: 23 mmol/L (ref 22–32)
Calcium: 9.4 mg/dL (ref 8.9–10.3)
Chloride: 105 mmol/L (ref 98–111)
Creatinine, Ser: 0.73 mg/dL (ref 0.44–1.00)
GFR calc Af Amer: >60 mL/min (ref >60)
GFR calc non Af Amer: >60 mL/min (ref >60)
Glucose, Bld: 110 mg/dL — ABNORMAL HIGH (ref 70–99)
POTASSIUM: 3.6 mmol/L (ref 3.5–5.1)
Sodium: 140 mmol/L (ref 135–145)
Total Bilirubin: 0.3 mg/dL (ref 0.3–1.2)
Total Protein: 7.2 g/dL (ref 6.5–8.1)

## 2018-12-17 MED ORDER — DEXAMETHASONE SODIUM PHOSPHATE 10 MG/ML IJ SOLN
INTRAMUSCULAR | Status: AC
  Filled 2018-12-17: qty 1

## 2018-12-17 MED ORDER — SODIUM CHLORIDE 0.9 % IV SOLN
2000.0000 mg | Freq: Once | INTRAVENOUS | Status: AC
  Administered 2018-12-17: 2000 mg via INTRAVENOUS
  Filled 2018-12-17: qty 52.6

## 2018-12-17 MED ORDER — SODIUM CHLORIDE 0.9% FLUSH
10.0000 mL | INTRAVENOUS | Status: DC | PRN
  Administered 2018-12-17: 10 mL
  Filled 2018-12-17: qty 10

## 2018-12-17 MED ORDER — HEPARIN SOD (PORK) LOCK FLUSH 100 UNIT/ML IV SOLN
500.0000 [IU] | Freq: Once | INTRAVENOUS | Status: AC | PRN
  Administered 2018-12-17: 500 [IU]
  Filled 2018-12-17: qty 5

## 2018-12-17 MED ORDER — PALONOSETRON HCL INJECTION 0.25 MG/5ML
INTRAVENOUS | Status: AC
  Filled 2018-12-17: qty 5

## 2018-12-17 MED ORDER — PALONOSETRON HCL INJECTION 0.25 MG/5ML
0.2500 mg | Freq: Once | INTRAVENOUS | Status: AC
  Administered 2018-12-17: 0.25 mg via INTRAVENOUS

## 2018-12-17 MED ORDER — SODIUM CHLORIDE 0.9 % IV SOLN
Freq: Once | INTRAVENOUS | Status: AC
  Administered 2018-12-17: 14:00:00 via INTRAVENOUS
  Filled 2018-12-17: qty 250

## 2018-12-17 MED ORDER — DEXAMETHASONE SODIUM PHOSPHATE 10 MG/ML IJ SOLN
10.0000 mg | Freq: Once | INTRAMUSCULAR | Status: AC
  Administered 2018-12-17: 10 mg via INTRAVENOUS

## 2018-12-17 MED ORDER — SODIUM CHLORIDE 0.9 % IV SOLN
253.2000 mg | Freq: Once | INTRAVENOUS | Status: AC
  Administered 2018-12-17: 250 mg via INTRAVENOUS
  Filled 2018-12-17: qty 25

## 2018-12-17 NOTE — Patient Instructions (Signed)
[facility] Discharge Instructions for Patients Receiving Chemotherapy  Today you received the following chemotherapy agents Gemcitabine (GEMZAR) & Carboplatin (PARAPLATIN).  To help prevent nausea and vomiting after your treatment, we encourage you to take your nausea medication as prescribed.   If you develop nausea and vomiting that is not controlled by your nausea medication, call the clinic.   BELOW ARE SYMPTOMS THAT SHOULD BE REPORTED IMMEDIATELY:  *FEVER GREATER THAN 100.5 F  *CHILLS WITH OR WITHOUT FEVER  NAUSEA AND VOMITING THAT IS NOT CONTROLLED WITH YOUR NAUSEA MEDICATION  *UNUSUAL SHORTNESS OF BREATH  *UNUSUAL BRUISING OR BLEEDING  TENDERNESS IN MOUTH AND THROAT WITH OR WITHOUT PRESENCE OF ULCERS  *URINARY PROBLEMS  *BOWEL PROBLEMS  UNUSUAL RASH Items with * indicate a potential emergency and should be followed up as soon as possible.  Feel free to call the clinic should you have any questions or concerns. The clinic phone number is [phone].  Please show the CHEMO ALERT CARD at check-in to the Emergency Department and triage nurse.   

## 2018-12-18 ENCOUNTER — Ambulatory Visit: Payer: 59 | Admitting: Cardiovascular Disease

## 2018-12-24 ENCOUNTER — Inpatient Hospital Stay (HOSPITAL_BASED_OUTPATIENT_CLINIC_OR_DEPARTMENT_OTHER): Payer: No Typology Code available for payment source | Admitting: Oncology

## 2018-12-24 ENCOUNTER — Inpatient Hospital Stay: Payer: No Typology Code available for payment source

## 2018-12-24 ENCOUNTER — Ambulatory Visit (HOSPITAL_COMMUNITY)
Admission: RE | Admit: 2018-12-24 | Discharge: 2018-12-24 | Disposition: A | Discharge status: Home / Self Care | Payer: 59 | Source: Ambulatory Visit | Attending: Oncology | Admitting: Oncology

## 2018-12-24 ENCOUNTER — Other Ambulatory Visit: Payer: Self-pay

## 2018-12-24 ENCOUNTER — Other Ambulatory Visit: Payer: Self-pay | Admitting: Oncology

## 2018-12-24 VITALS — BP 149/91 | HR 81 | Temp 98.4°F | Resp 20

## 2018-12-24 DIAGNOSIS — Z79899 Other long term (current) drug therapy: Secondary | ICD-10-CM

## 2018-12-24 DIAGNOSIS — C50212 Malignant neoplasm of upper-inner quadrant of left female breast: Secondary | ICD-10-CM | POA: Insufficient documentation

## 2018-12-24 DIAGNOSIS — C50912 Malignant neoplasm of unspecified site of left female breast: Secondary | ICD-10-CM | POA: Insufficient documentation

## 2018-12-24 DIAGNOSIS — E039 Hypothyroidism, unspecified: Secondary | ICD-10-CM | POA: Diagnosis not present

## 2018-12-24 DIAGNOSIS — Z9013 Acquired absence of bilateral breasts and nipples: Secondary | ICD-10-CM | POA: Diagnosis not present

## 2018-12-24 DIAGNOSIS — I10 Essential (primary) hypertension: Secondary | ICD-10-CM

## 2018-12-24 DIAGNOSIS — Z171 Estrogen receptor negative status [ER-]: Principal | ICD-10-CM

## 2018-12-24 DIAGNOSIS — Z95828 Presence of other vascular implants and grafts: Secondary | ICD-10-CM

## 2018-12-24 DIAGNOSIS — E7800 Pure hypercholesterolemia, unspecified: Secondary | ICD-10-CM

## 2018-12-24 DIAGNOSIS — F419 Anxiety disorder, unspecified: Secondary | ICD-10-CM

## 2018-12-24 DIAGNOSIS — Z5111 Encounter for antineoplastic chemotherapy: Secondary | ICD-10-CM | POA: Diagnosis not present

## 2018-12-24 DIAGNOSIS — Z923 Personal history of irradiation: Secondary | ICD-10-CM

## 2018-12-24 DIAGNOSIS — Z7984 Long term (current) use of oral hypoglycemic drugs: Secondary | ICD-10-CM

## 2018-12-24 LAB — CBC WITH DIFFERENTIAL/PLATELET
ABS IMMATURE GRANULOCYTES: 0.6 10*3/uL — AB (ref 0.00–0.07)
Basophils Absolute: 0.1 10*3/uL (ref 0.0–0.1)
Basophils Relative: 1 %
Eosinophils Absolute: 0 10*3/uL (ref 0.0–0.5)
Eosinophils Relative: 1 %
HCT: 36.6 % (ref 36.0–46.0)
HEMOGLOBIN: 12.1 g/dL (ref 12.0–15.0)
Immature Granulocytes: 12 %
Lymphocytes Relative: 37 %
Lymphs Abs: 1.8 10*3/uL (ref 0.7–4.0)
MCH: 31.2 pg (ref 26.0–34.0)
MCHC: 33.1 g/dL (ref 30.0–36.0)
MCV: 94.3 fL (ref 80.0–100.0)
MONOS PCT: 7 %
Monocytes Absolute: 0.3 10*3/uL (ref 0.1–1.0)
NEUTROS ABS: 2.1 10*3/uL (ref 1.7–7.7)
Neutrophils Relative %: 42 %
Platelets: 375 10*3/uL (ref 150–400)
RBC: 3.88 MIL/uL (ref 3.87–5.11)
RDW: 13.1 % (ref 11.5–15.5)
WBC: 5 10*3/uL (ref 4.0–10.5)
nRBC: 0.4 % — ABNORMAL HIGH (ref 0.0–0.2)

## 2018-12-24 LAB — COMPREHENSIVE METABOLIC PANEL
ALT: 84 U/L — ABNORMAL HIGH (ref 0–44)
AST: 42 U/L — ABNORMAL HIGH (ref 15–41)
Albumin: 4.1 g/dL (ref 3.5–5.0)
Alkaline Phosphatase: 75 U/L (ref 38–126)
Anion gap: 11 (ref 5–15)
BUN: 11 mg/dL (ref 6–20)
CO2: 27 mmol/L (ref 22–32)
Calcium: 9.6 mg/dL (ref 8.9–10.3)
Chloride: 100 mmol/L (ref 98–111)
Creatinine, Ser: 0.71 mg/dL (ref 0.44–1.00)
GFR calc Af Amer: >60 mL/min (ref >60)
GFR calc non Af Amer: >60 mL/min (ref >60)
GLUCOSE: 84 mg/dL (ref 70–99)
Potassium: 3.9 mmol/L (ref 3.5–5.1)
Sodium: 138 mmol/L (ref 135–145)
Total Bilirubin: 0.2 mg/dL — ABNORMAL LOW (ref 0.3–1.2)
Total Protein: 7.7 g/dL (ref 6.5–8.1)

## 2018-12-24 MED ORDER — SODIUM CHLORIDE 0.9 % IV SOLN
Freq: Once | INTRAVENOUS | Status: AC
  Administered 2018-12-24: 15:00:00 via INTRAVENOUS
  Filled 2018-12-24: qty 250

## 2018-12-24 MED ORDER — SODIUM CHLORIDE 0.9 % IV SOLN
253.2000 mg | Freq: Once | INTRAVENOUS | Status: AC
  Administered 2018-12-24: 250 mg via INTRAVENOUS
  Filled 2018-12-24: qty 25

## 2018-12-24 MED ORDER — HEPARIN SOD (PORK) LOCK FLUSH 100 UNIT/ML IV SOLN
500.0000 [IU] | Freq: Once | INTRAVENOUS | Status: AC | PRN
  Administered 2018-12-24: 500 [IU]
  Filled 2018-12-24: qty 5

## 2018-12-24 MED ORDER — SODIUM CHLORIDE 0.9% FLUSH
10.0000 mL | INTRAVENOUS | Status: DC | PRN
  Administered 2018-12-24: 10 mL
  Filled 2018-12-24: qty 10

## 2018-12-24 MED ORDER — DEXAMETHASONE SODIUM PHOSPHATE 10 MG/ML IJ SOLN
10.0000 mg | Freq: Once | INTRAMUSCULAR | Status: AC
  Administered 2018-12-24: 10 mg via INTRAVENOUS

## 2018-12-24 MED ORDER — PALONOSETRON HCL INJECTION 0.25 MG/5ML
INTRAVENOUS | Status: AC
  Filled 2018-12-24: qty 5

## 2018-12-24 MED ORDER — SODIUM CHLORIDE 0.9 % IV SOLN
2000.0000 mg | Freq: Once | INTRAVENOUS | Status: AC
  Administered 2018-12-24: 2000 mg via INTRAVENOUS
  Filled 2018-12-24: qty 52.6

## 2018-12-24 MED ORDER — DEXAMETHASONE SODIUM PHOSPHATE 10 MG/ML IJ SOLN
INTRAMUSCULAR | Status: AC
  Filled 2018-12-24: qty 1

## 2018-12-24 MED ORDER — PALONOSETRON HCL INJECTION 0.25 MG/5ML
0.2500 mg | Freq: Once | INTRAVENOUS | Status: AC
  Administered 2018-12-24: 0.25 mg via INTRAVENOUS

## 2018-12-24 NOTE — Progress Notes (Signed)
[place]  Telephone:([phone]) [phone] [phone]) [phone]    ID: Alexandria Bradley OB: [birth year]  MR#: [id number]  ZHY#:[id number]  Patient Care Team: [name], DO as PCP - General (Family Medicine) Josue Hector, MD as PCP - Cardiology (Cardiology) Banks Chaikin, Virgie Dad, MD as Consulting Physician (Oncology) Brien Few, MD as Consulting Physician (Obstetrics and Gynecology) [name], MD as Consulting Physician (General Surgery) [name], MD as Referring Physician (Radiation Oncology) Delrae Rend, MD as Consulting Physician (Endocrinology) Wilford Corner, MD as Consulting Physician (Gastroenterology) Dorna Leitz, MD as Consulting Physician (Orthopedic Surgery) Josue Hector, MD as Consulting Physician (Cardiology) OTHER MD:    CHIEF COMPLAINT: Triple negative breast cancer, locally recurrent  CURRENT TREATMENT: Neoadjuvant chemotherapy   INTERVAL HISTORY: Alexandria Bradley is here today to receive her chemotherapy.  She complained to the nurse that she was having back pain for several days and it was waking her up at night.  She was not comfortable being treated before being evaluated   REVIEW OF SYSTEMS: Alexandria Bradley is tolerating treatment generally well although she does get upset stomach with this and she has a history of reflux and general GI sensitivity.  Aside from that she says she has pain in the lower and mid back.  It does not seem to be better or worse with sitting standing or lying.  It is not constant however.  She has not taken any pain medicine because she is afraid of hurting her liver.  A detailed review of systems today was otherwise stable.   BREAST CANCER HISTORY: From the original intake note of 02/16/2014:  Alexandria Bradley palpated a mass in her left breast early May and brought it immediately to her gynecologist attention. He set her up for bilateral diagnostic mammography and left ultrasonography at [facility] 02/07/2014.  Mammography showed a 1.3 cm oval mass with spiculated margins in the left breast at the 11:00 position. This was palpable. Ultrasound showed a 1.1 cm tall her than wide mass with a microlobulated margins. He was hypoechoic. There were no abnormalities noted in the left axilla.  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 Lyndon Code at conference 02/16/2014), triple negative, with an MIB-1 of 50%.  The patient's subsequent history is as detailed below   PAST MEDICAL HISTORY: Past Medical History:  Diagnosis Date  . Anxiety   . Arthritis   . Back pain   . Breast cancer ([place])    left  . Dyspnea   . Fatty liver   . GERD (gastroesophageal reflux disease)   . Headache(784.0)    PMH : migraines  . History of kidney stones   . Hypercholesterolemia   . Hypertension   . Hypothyroidism   . Kidney stones   . Osteoporosis   . Personal history of chemotherapy   . Personal history of radiation therapy   . PONV (postoperative nausea and vomiting)    difficult to wake up  . Pre-diabetes   . Psoriasis   . PVC's (premature ventricular contractions)   . Radiation 08/18/14-10/07/14   Left breast    PAST SURGICAL HISTORY: Past Surgical History:  Procedure Laterality Date  . BREAST BIOPSY Left 11/10/2018  . BREAST LUMPECTOMY Left 2015  . BREAST LUMPECTOMY WITH AXILLARY LYMPH NODE BIOPSY  6/15   left  . CARDIAC CATHETERIZATION    . CHOLECYSTECTOMY    . COLONOSCOPY N/A 06/27/2014   Procedure:  COLONOSCOPY;  Surgeon: Lear Ng, MD;  Location: [facility] END[facility];  Service: Endoscopy;  Laterality: N/A;  . LEFT HEART CATH AND CORONARY ANGIOGRAPHY N/A 07/24/2018   Procedure: LEFT HEART CATH AND CORONARY ANGIOGRAPHY;  Surgeon: Troy Sine, MD;  Location: [place] [facility];  Service: Cardiovascular;  Laterality: N/A;  . LIPOMA EXCISION    . MYOMECTOMY    . PORT-A-CATH REMOVAL N/A  02/03/2015   Procedure: REMOVAL PORT-A-CATH;  Surgeon: [name], MD;  Location: [place];  Service: General;  Laterality: N/A;  . PORTACATH PLACEMENT N/A 03/01/2014   Procedure: INSERTION PORT-A-CATH;  Surgeon: [name], MD;  Location: [place];  Service: General;  Laterality: N/A;  . PORTACATH PLACEMENT N/A 11/18/2018   Procedure: INSERTION PORT-A-CATH WITH ULTRASOUND;  Surgeon: [name], MD;  Location: [place];  Service: General;  Laterality: N/A;  . TONSILLECTOMY    . TUBAL LIGATION      FAMILY HISTORY: Family History  Problem Relation Age of Onset  . Endometrial cancer Mother 52  . Hearing loss Mother   . Atrial fibrillation Mother   . Lung cancer Father   . Brain cancer Father   . Heart disease Maternal Aunt   . Atrial fibrillation Maternal Aunt   . Lung cancer Maternal Uncle   . Atrial fibrillation Maternal Uncle   . Stroke Maternal Grandmother   . Stroke Maternal Grandfather   . Bladder Cancer Maternal Uncle   . Multiple myeloma Maternal Uncle   . Other Son        trisomy 81   The patient's father died at the age of 56 from what may have been metastatic lung cancer. The patient knows very little about the father's side of her family. Her mother is alive at age 32. She was recently diagnosed with endometrial cancer. The patient has one brother, 2 sisters. There is no history of breast or ovarian cancer in the family.   GYNECOLOGIC HISTORY:  Menarche age 54, first live birth age 50. The patient is GX P3. One child died shortly after birth. She stopped having periods approximately 2005. She did not use hormone replacement. 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 Son [name] is  Nurse, adult in [place]. Daughter [name] lives in [place] and works for [employer] as an Therapist, sports. She has three grandchildren.    ADVANCED DIRECTIVES: Not in place   HEALTH MAINTENANCE: Social History   Tobacco Use  . Smoking status: Former Smoker    Packs/day: 0.50    Years: 15.00    Pack years: 7.50    Types: Cigarettes  . Smokeless tobacco: Never Used  . Tobacco comment: Quit smoking cigarettes in 2002  Substance Use Topics  . Alcohol use: Yes    Comment: social  . Drug use: No     Colonoscopy: 2010  PAP: 2013  Bone density: 05/15/2009, at [facility], normal, lowest T score -0.8  Lipid panel:  Allergies  Allergen Reactions  . Chlorhexidine Itching  . Ciprofloxacin Anxiety, Rash and Other (See Comments)    GI upset  . Prednisone Shortness Of Breath and Other (See Comments)    "jacks me up" jittery   . Codeine Nausea Only  . Cortisone Swelling  and Other (See Comments)    "jack me up"     Current Outpatient Medications  Medication Sig Dispense Refill  . acetaminophen (TYLENOL) 500 MG tablet Take 1,000 mg by mouth at bedtime as needed for headache.     . betamethasone dipropionate (DIPROLENE) 0.05 % cream Apply 1 application topically daily as needed (psoriasis).    . bismuth subsalicylate (PEPTO BISMOL) 262 MG chewable tablet Chew 524 mg by mouth as needed for indigestion.    Marland Kitchen dexamethasone (DECADRON) 4 MG tablet Take 2 tablets (8 mg total) by mouth daily. Start the day after chemotherapy for 2 days. Take with food. 30 tablet 1  . diltiazem (CARDIZEM CD) 240 MG 24 hr capsule Take 1 capsule (240 mg total) by mouth daily. 90 capsule 3  . fluticasone (FLONASE) 50 MCG/ACT nasal spray SPRAY 2 SPRAYS INTO EACH NOSTRIL EVERY DAY (Patient taking differently: Place 1 spray into both nostrils daily as needed for allergies. ) 48 g 1  . lidocaine-prilocaine (EMLA) cream Apply to affected area once 30 g 3  . liothyronine (CYTOMEL) 5  MCG tablet Take 5 mcg by mouth daily before breakfast.     . loratadine (CLARITIN) 10 MG tablet Take 10 mg by mouth at bedtime.     . metFORMIN (GLUCOPHAGE-XR) 500 MG 24 hr tablet Take 1,000 mg by mouth every evening.    Marland Kitchen Phenylephrine-Acetaminophen (TYLENOL SINUS+HEADACHE PO) Take 1 tablet by mouth daily as needed (sinus headaches).    . prochlorperazine (COMPAZINE) 10 MG tablet Take 1 tablet (10 mg total) by mouth every 6 (six) hours as needed (Nausea or vomiting). 30 tablet 1  . propranolol (INDERAL) 10 MG tablet Take 10 mg by mouth daily as needed (heart palpitations).     . TIROSINT 100 MCG CAPS Take 100 mcg by mouth daily before breakfast.   4  . triamterene-hydrochlorothiazide (MAXZIDE-25) 37.5-25 MG tablet Take 1 tablet by mouth daily. 90 tablet 3   No current facility-administered medications for this visit.    Facility-Administered Medications Ordered in Other Visits  Medication Dose Route Frequency Provider Last Rate Last Dose  . 0.9 %  sodium chloride infusion   Intravenous Once Lashayla Armes, Virgie Dad, MD      . CARBOplatin (PARAPLATIN) 250 mg in sodium chloride 0.9 % 100 mL chemo infusion  250 mg Intravenous Once Mikaelyn Arthurs, Virgie Dad, MD      . dexamethasone (DECADRON) injection 10 mg  10 mg Intravenous Once Kavian Peters, Virgie Dad, MD      . gemcitabine (GEMZAR) 1,976 mg in sodium chloride 0.9 % 100 mL chemo infusion  1,000 mg/m2 (Treatment Plan Recorded) Intravenous Once Arlana Canizales, Virgie Dad, MD      . heparin lock flush 100 unit/mL  500 Units Intracatheter Once PRN Quenisha Lovins, Virgie Dad, MD      . palonosetron (ALOXI) injection 0.25 mg  0.25 mg Intravenous Once Alyn Jurney, Virgie Dad, MD      . sodium chloride flush (NS) 0.9 % injection 10 mL  10 mL Intracatheter PRN Andrae Claunch, Virgie Dad, MD        OBJECTIVE: Middle-aged white woman in no acute distress There were no vitals filed for this visit.   There is no height or weight on file to calculate BMI.    ECOG FS:1 - Symptomatic but completely  ambulatory  For vital signs today please see the infusion area flowsheet  Exam shows no cervical or supraclavicular adenopathy.  The lungs are clear to auscultation and percussion.  Careful palpation of  the entire spine shows no focal tenderness but she complains of discomfort or pain in the right greater than the left hip area and a little bit more on the right scapular area than the left.     LAB RESULTS: No results found for: SPEP  Lab Results  Component Value Date   WBC 5.0 12/24/2018   NEUTROABS 2.1 12/24/2018   HGB 12.1 12/24/2018   HCT 36.6 12/24/2018   MCV 94.3 12/24/2018   PLT 375 12/24/2018      Chemistry      Component Value Date/Time   NA 138 12/24/2018 1325   NA 140 06/19/2017 1312   K 3.9 12/24/2018 1325   K 3.5 06/19/2017 1312   CL 100 12/24/2018 1325   CO2 27 12/24/2018 1325   CO2 25 06/19/2017 1312   BUN 11 12/24/2018 1325   BUN 18.4 06/19/2017 1312   CREATININE 0.71 12/24/2018 1325   CREATININE 0.8 06/19/2017 1312      Component Value Date/Time   CALCIUM 9.6 12/24/2018 1325   CALCIUM 10.2 06/19/2017 1312   ALKPHOS 75 12/24/2018 1325   ALKPHOS 86 06/19/2017 1312   AST 42 (H) 12/24/2018 1325   AST 15 06/19/2017 1312   ALT 84 (H) 12/24/2018 1325   ALT 22 06/19/2017 1312   BILITOT 0.2 (L) 12/24/2018 1325   BILITOT 0.26 06/19/2017 1312       No results found for: LABCA2  No components found for: LABCA125  No results for input(s): INR in the last 168 hours.  Urinalysis    Component Value Date/Time   COLORURINE YELLOW 03/13/2009 2251   APPEARANCEUR CLOUDY (A) 03/13/2009 2251   LABSPEC 1.020 06/15/2014 1101   PHURINE 6.0 06/15/2014 1101   PHURINE 5.5 03/13/2009 2251   GLUCOSEU Negative 06/15/2014 1101   HGBUR Negative 06/15/2014 1101   HGBUR NEGATIVE 03/13/2009 2251   BILIRUBINUR Color Interference 06/15/2014 1101   KETONESUR Negative 06/15/2014 1101   KETONESUR NEGATIVE 03/13/2009 2251   PROTEINUR 30 06/15/2014 1101   PROTEINUR  NEGATIVE 03/13/2009 2251   UROBILINOGEN 0.2 06/15/2014 1101   NITRITE Negative 06/15/2014 1101   NITRITE NEGATIVE 03/13/2009 2251   LEUKOCYTESUR Trace 06/15/2014 1101    STUDIES: No results found.  ASSESSMENT: 58 y.o. [place] woman status post left breast upper inner quadrant biopsy 02/08/2014 for a clinical T1c N0, stage IA invasive ductal carcinoma, grade 3, triple negative, with an MIB-1 of 50%.  (1) status post left lumpectomy and sentinel lymph node sampling 03/01/2014 for a pT1c pN0, stage IA invasive ductal carcinoma, grade 3, with negative margins, repeat prognostic panel again triple negative.  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  Left breast with breath hold technique/ 45 Gy at 1.8 Gy per fraction x 25 fractions.   Left breast boost/ 16 Gy at 2 Gy per fraction x 8 fractions  (4) genetics counseling 07/06/2015 through the Breast/Ovarian cancer gene panel offered by [facility] found no deleterious mutations in ATM, BARD1, BRCA1, BRCA2, BRIP1, CDH1, CHEK2, EPCAM, FANCC, MLH1, MSH2, MSH6, NBN, PALB2, PMS2, PTEN, RAD51C, RAD51D, TP53, and XRCC2.  (5) question of sleep apnea  (6)  transverse colon lesion noted on CT scan from 06/17/2014 - negative biopsy, also showed fatty liver  (a) repeat CT scan of the abdomen and pelvis with contrast 02/15/2016 negative  RECURRENT DISEASE: February 2020 (7) biopsy of palpable mass upper inner left breast 11/10/2018 confirms invasive ductal carcinoma, grade 2 or 3, triple negative, with an MIB-1 of 80%.   (a) CT scans of the chest abdomen and pelvis with contrast showed no stage IV disease  (b) bone scan 11/13/2018 shows no definite metastasis to bone  (8) neoadjuvant chemotherapy will consist of carboplatin and gemcitabine given days 1 and 8 of each 21-day cycle, for 6 cycles, started 11/26/2018  (9) bilateral mastectomies with left sentinel lymph node sampling and immediate reconstruction to follow   PLAN: Cammi is likely extra stressed not only because of her breast cancer diagnosis but because of the current pandemic.  This causes [place] to tighten our muscles and that can result in cramps and painful musculoskeletal symptoms.  I really think that that is what is going on.  As far as her thoracic spine discomfort is concerned we just did thoracic MRIs which were entirely benign.  I am going to obtain some pelvic and hip films today.  I do not think they will show any evidence of cancer.  Likely they will show some evidence of arthritis.  I suggested she try Tylenol 500 mg together with Aleve 220 mg and see if that relieves the pain.  She does have a slightly high blood pressure today, likely again related to stress, and she has a slightly elevated liver function test.  We are proceeding with treatment as before.  Dearies Meikle, Virgie Dad, MD  12/24/18 2:56 PM Medical Oncology and Hematology [facility] [address] [place], [place] [address] Tel. [phone]    Fax. [phone]

## 2018-12-24 NOTE — Patient Instructions (Signed)
[facility] Discharge Instructions for Patients Receiving Chemotherapy  Today you received the following chemotherapy agents Gemcitabine (GEMZAR) & Carboplatin (PARAPLATIN).  To help prevent nausea and vomiting after your treatment, we encourage you to take your nausea medication as prescribed.   If you develop nausea and vomiting that is not controlled by your nausea medication, call the clinic.   BELOW ARE SYMPTOMS THAT SHOULD BE REPORTED IMMEDIATELY:  *FEVER GREATER THAN 100.5 F  *CHILLS WITH OR WITHOUT FEVER  NAUSEA AND VOMITING THAT IS NOT CONTROLLED WITH YOUR NAUSEA MEDICATION  *UNUSUAL SHORTNESS OF BREATH  *UNUSUAL BRUISING OR BLEEDING  TENDERNESS IN MOUTH AND THROAT WITH OR WITHOUT PRESENCE OF ULCERS  *URINARY PROBLEMS  *BOWEL PROBLEMS  UNUSUAL RASH Items with * indicate a potential emergency and should be followed up as soon as possible.  Feel free to call the clinic should you have any questions or concerns. The clinic phone number is [phone].  Please show the CHEMO ALERT CARD at check-in to the Emergency Department and triage nurse.   

## 2018-12-24 NOTE — Progress Notes (Signed)
Dr. Jana Hakim came to patient chairside to discuss back pain patient is having. Patient instructed to go to radiology after infusion. Received okay to proceed with treatment today with ALT of 84 and blood pressure of 149/91.

## 2018-12-25 ENCOUNTER — Encounter: Payer: Self-pay | Admitting: Oncology

## 2019-01-04 ENCOUNTER — Telehealth: Payer: Self-pay

## 2019-01-04 NOTE — Telephone Encounter (Signed)
I left a message for the patient to return my call about appointment with Dr. Johnsie Cancel.

## 2019-01-05 ENCOUNTER — Telehealth: Payer: Self-pay | Admitting: Cardiovascular Disease

## 2019-01-05 NOTE — Telephone Encounter (Signed)
New Message    Pt is returning phone call for [facility]   Please call  Back

## 2019-01-05 NOTE — Telephone Encounter (Signed)
Follow Up: ° ° ° °Returning your call from yesterday. °

## 2019-01-05 NOTE — Telephone Encounter (Signed)
I left a message for the patient to return my call.

## 2019-01-05 NOTE — Telephone Encounter (Signed)
Spoke with pt who states she would like to cancel appt with DR. Nishan in June and will call back to reschedule when she is ready.

## 2019-01-07 ENCOUNTER — Other Ambulatory Visit: Payer: Self-pay

## 2019-01-07 ENCOUNTER — Inpatient Hospital Stay: Payer: No Typology Code available for payment source

## 2019-01-07 ENCOUNTER — Inpatient Hospital Stay: Payer: No Typology Code available for payment source | Attending: Oncology

## 2019-01-07 ENCOUNTER — Encounter: Payer: Self-pay | Admitting: Adult Health

## 2019-01-07 ENCOUNTER — Inpatient Hospital Stay (HOSPITAL_BASED_OUTPATIENT_CLINIC_OR_DEPARTMENT_OTHER): Payer: No Typology Code available for payment source | Admitting: Adult Health

## 2019-01-07 VITALS — HR 95

## 2019-01-07 VITALS — BP 131/85 | HR 101 | Temp 98.2°F | Resp 18 | Ht 65.0 in | Wt 192.5 lb

## 2019-01-07 DIAGNOSIS — Z9013 Acquired absence of bilateral breasts and nipples: Secondary | ICD-10-CM | POA: Insufficient documentation

## 2019-01-07 DIAGNOSIS — Z923 Personal history of irradiation: Secondary | ICD-10-CM | POA: Diagnosis not present

## 2019-01-07 DIAGNOSIS — Z171 Estrogen receptor negative status [ER-]: Secondary | ICD-10-CM | POA: Diagnosis not present

## 2019-01-07 DIAGNOSIS — Z95828 Presence of other vascular implants and grafts: Secondary | ICD-10-CM

## 2019-01-07 DIAGNOSIS — C50212 Malignant neoplasm of upper-inner quadrant of left female breast: Secondary | ICD-10-CM

## 2019-01-07 DIAGNOSIS — Z79899 Other long term (current) drug therapy: Secondary | ICD-10-CM | POA: Diagnosis not present

## 2019-01-07 DIAGNOSIS — Z5111 Encounter for antineoplastic chemotherapy: Secondary | ICD-10-CM | POA: Diagnosis not present

## 2019-01-07 DIAGNOSIS — C50912 Malignant neoplasm of unspecified site of left female breast: Secondary | ICD-10-CM

## 2019-01-07 LAB — COMPREHENSIVE METABOLIC PANEL
ALT: 73 U/L — ABNORMAL HIGH (ref 0–44)
AST: 41 U/L (ref 15–41)
Albumin: 4.2 g/dL (ref 3.5–5.0)
Alkaline Phosphatase: 65 U/L (ref 38–126)
Anion gap: 15 (ref 5–15)
BUN: 12 mg/dL (ref 6–20)
CO2: 22 mmol/L (ref 22–32)
Calcium: 10.1 mg/dL (ref 8.9–10.3)
Chloride: 105 mmol/L (ref 98–111)
Creatinine, Ser: 0.85 mg/dL (ref 0.44–1.00)
GFR calc Af Amer: >60 mL/min (ref >60)
GFR calc non Af Amer: >60 mL/min (ref >60)
Glucose, Bld: 139 mg/dL — ABNORMAL HIGH (ref 70–99)
Potassium: 3.5 mmol/L (ref 3.5–5.1)
Sodium: 142 mmol/L (ref 135–145)
Total Bilirubin: 0.3 mg/dL (ref 0.3–1.2)
Total Protein: 7.4 g/dL (ref 6.5–8.1)

## 2019-01-07 LAB — CBC WITH DIFFERENTIAL/PLATELET
Abs Immature Granulocytes: 0.06 10*3/uL (ref 0.00–0.07)
Basophils Absolute: 0 10*3/uL (ref 0.0–0.1)
Basophils Relative: 0 %
Eosinophils Absolute: 0.1 10*3/uL (ref 0.0–0.5)
Eosinophils Relative: 2 %
HCT: 35 % — ABNORMAL LOW (ref 36.0–46.0)
Hemoglobin: 11.6 g/dL — ABNORMAL LOW (ref 12.0–15.0)
Immature Granulocytes: 1 %
Lymphocytes Relative: 22 %
Lymphs Abs: 1.2 10*3/uL (ref 0.7–4.0)
MCH: 31.1 pg (ref 26.0–34.0)
MCHC: 33.1 g/dL (ref 30.0–36.0)
MCV: 93.8 fL (ref 80.0–100.0)
Monocytes Absolute: 0.7 10*3/uL (ref 0.1–1.0)
Monocytes Relative: 12 %
Neutro Abs: 3.4 10*3/uL (ref 1.7–7.7)
Neutrophils Relative %: 63 %
Platelets: 269 10*3/uL (ref 150–400)
RBC: 3.73 MIL/uL — ABNORMAL LOW (ref 3.87–5.11)
RDW: 14.6 % (ref 11.5–15.5)
WBC: 5.4 10*3/uL (ref 4.0–10.5)
nRBC: 0 % (ref 0.0–0.2)

## 2019-01-07 MED ORDER — SODIUM CHLORIDE 0.9% FLUSH
10.0000 mL | INTRAVENOUS | Status: DC | PRN
  Administered 2019-01-07: 10 mL
  Filled 2019-01-07: qty 10

## 2019-01-07 MED ORDER — HEPARIN SOD (PORK) LOCK FLUSH 100 UNIT/ML IV SOLN
500.0000 [IU] | Freq: Once | INTRAVENOUS | Status: AC | PRN
  Administered 2019-01-07: 16:00:00 500 [IU]
  Filled 2019-01-07: qty 5

## 2019-01-07 MED ORDER — PALONOSETRON HCL INJECTION 0.25 MG/5ML
INTRAVENOUS | Status: AC
  Filled 2019-01-07: qty 5

## 2019-01-07 MED ORDER — DEXAMETHASONE SODIUM PHOSPHATE 10 MG/ML IJ SOLN
10.0000 mg | Freq: Once | INTRAMUSCULAR | Status: AC
  Administered 2019-01-07: 14:00:00 10 mg via INTRAVENOUS

## 2019-01-07 MED ORDER — PALONOSETRON HCL INJECTION 0.25 MG/5ML
0.2500 mg | Freq: Once | INTRAVENOUS | Status: AC
  Administered 2019-01-07: 14:00:00 0.25 mg via INTRAVENOUS

## 2019-01-07 MED ORDER — DEXAMETHASONE SODIUM PHOSPHATE 10 MG/ML IJ SOLN
INTRAMUSCULAR | Status: AC
  Filled 2019-01-07: qty 1

## 2019-01-07 MED ORDER — SODIUM CHLORIDE 0.9% FLUSH
10.0000 mL | INTRAVENOUS | Status: DC | PRN
  Filled 2019-01-07: qty 10

## 2019-01-07 MED ORDER — SODIUM CHLORIDE 0.9 % IV SOLN
250.0000 mg | Freq: Once | INTRAVENOUS | Status: AC
  Administered 2019-01-07: 16:00:00 250 mg via INTRAVENOUS
  Filled 2019-01-07: qty 25

## 2019-01-07 MED ORDER — SODIUM CHLORIDE 0.9 % IV SOLN
2000.0000 mg | Freq: Once | INTRAVENOUS | Status: AC
  Administered 2019-01-07: 15:00:00 2000 mg via INTRAVENOUS
  Filled 2019-01-07: qty 52.6

## 2019-01-07 MED ORDER — SODIUM CHLORIDE 0.9 % IV SOLN
Freq: Once | INTRAVENOUS | Status: AC
  Administered 2019-01-07: 14:00:00 via INTRAVENOUS
  Filled 2019-01-07: qty 250

## 2019-01-07 NOTE — Progress Notes (Signed)
[place]  [phone] [phone]    ID: Alexandria Bradley OB: [birth year]  MR#: [id number]  QQV#:[id number]  Patient Care Team: [name], DO as PCP - General (Family Medicine) Josue Hector, MD as PCP - Cardiology (Cardiology) Magrinat, Virgie Dad, MD as Consulting Physician (Oncology) Brien Few, MD as Consulting Physician (Obstetrics and Gynecology) [name], MD as Consulting Physician (General Surgery) [name], MD as Referring Physician (Radiation Oncology) Delrae Rend, MD as Consulting Physician (Endocrinology) Wilford Corner, MD as Consulting Physician (Gastroenterology) Dorna Leitz, MD as Consulting Physician (Orthopedic Surgery) Josue Hector, MD as Consulting Physician (Cardiology) OTHER MD:    CHIEF COMPLAINT: Triple negative breast cancer, locally recurrent  CURRENT TREATMENT: Neoadjuvant chemotherapy   INTERVAL HISTORY: Alexandria Bradley is here today to receive her chemotherapy for her recurrent triple negative breast cancer.  She is receiving Gemcitabine and Carboplatin given on days 1 and 8 of a 21 day cycle.  She notes that she is tolerating her chemotherapy well.  [name] says she cannot determine whether or tumor is responding to the treatment.  She notes it is still present.  She says sometimes she thinks it is bigger, but is having a hard time determining.     REVIEW OF SYSTEMS: [name] is feeling well today.  She denies any fevers or chills.  She is without any nausea, vomiting, bowel/bladder changes.  She hasn't noted cough, shortness of breath or chest pain.  A detailed ROS was otherwise non contributory.     BREAST CANCER HISTORY: From the original intake note of 02/16/2014:  Alexandria Bradley palpated a mass in her left breast early May and brought it immediately to her gynecologist attention. He set her up for bilateral diagnostic mammography and left ultrasonography at [facility] 02/07/2014.  Mammography showed a 1.3 cm oval mass with spiculated margins in the left breast at the 11:00 position. This was palpable. Ultrasound showed a 1.1 cm tall her than wide mass with a microlobulated margins. He was hypoechoic. There were no abnormalities noted in the left axilla.  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 Lyndon Code at conference 02/16/2014), triple negative, with an MIB-1 of 50%.  The patient's subsequent history is as detailed below   PAST MEDICAL HISTORY: Past Medical History:  Diagnosis Date   Anxiety    Arthritis    Back pain    Breast cancer ([place])    left   Dyspnea    Fatty liver    GERD (gastroesophageal reflux disease)    Headache(784.0)    PMH : migraines   History of kidney stones    Hypercholesterolemia    Hypertension    Hypothyroidism    Kidney stones    Osteoporosis    Personal history of chemotherapy    Personal history of radiation therapy    PONV (postoperative nausea and vomiting)    difficult to wake up   Pre-diabetes    Psoriasis    PVC's (premature ventricular contractions)    Radiation 08/18/14-10/07/14   Left breast    PAST SURGICAL HISTORY: Past Surgical History:  Procedure Laterality Date   BREAST BIOPSY Left 11/10/2018   BREAST LUMPECTOMY Left 2015   BREAST LUMPECTOMY WITH AXILLARY LYMPH NODE BIOPSY  6/15   left   CARDIAC CATHETERIZATION     CHOLECYSTECTOMY     COLONOSCOPY N/A 06/27/2014   Procedure: COLONOSCOPY;  Surgeon: Evette Doffing  Aloha Gell, MD;  Location: [place];  Service: Endoscopy;  Laterality: N/A;   LEFT HEART CATH AND CORONARY ANGIOGRAPHY N/A 07/24/2018   Procedure: LEFT HEART CATH AND CORONARY ANGIOGRAPHY;  Surgeon: Troy Sine, MD;  Location: [facility];  Service: Cardiovascular;  Laterality: N/A;   LIPOMA EXCISION     MYOMECTOMY     PORT-A-CATH REMOVAL N/A  02/03/2015   Procedure: REMOVAL PORT-A-CATH;  Surgeon: [name], MD;  Location: [place];  Service: General;  Laterality: N/A;   PORTACATH PLACEMENT N/A 03/01/2014   Procedure: INSERTION PORT-A-CATH;  Surgeon: [name], MD;  Location: [place];  Service: General;  Laterality: N/A;   PORTACATH PLACEMENT N/A 11/18/2018   Procedure: INSERTION PORT-A-CATH WITH ULTRASOUND;  Surgeon: [name], MD;  Location: [place];  Service: General;  Laterality: N/A;   TONSILLECTOMY     TUBAL LIGATION      FAMILY HISTORY: Family History  Problem Relation Age of Onset   Endometrial cancer Mother 52   Hearing loss Mother    Atrial fibrillation Mother    Lung cancer Father    Brain cancer Father    Heart disease Maternal Aunt    Atrial fibrillation Maternal Aunt    Lung cancer Maternal Uncle    Atrial fibrillation Maternal Uncle    Stroke Maternal Grandmother    Stroke Maternal Grandfather    Bladder Cancer Maternal Uncle    Multiple myeloma Maternal Uncle    Other Son        trisomy 29   The patient's father died at the age of 66 from what may have been metastatic lung cancer. The patient knows very little about the father's side of her family. Her mother is alive at age 59. She was recently diagnosed with endometrial cancer. The patient has one brother, 2 sisters. There is no history of breast or ovarian cancer in the family.   GYNECOLOGIC HISTORY:  Menarche age 66, first live birth age 29. The patient is GX P3. One child died shortly after birth. She stopped having periods approximately 2005. She did not use hormone replacement. 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 Son Alexandria Bradley is  Nurse, adult in [place]. Daughter Alexandria Bradley lives in [place] and works for [employer] as an Therapist, sports. She has three grandchildren.    ADVANCED DIRECTIVES: Not in place   HEALTH MAINTENANCE: Social History   Tobacco Use   Smoking status: Former Smoker    Packs/day: 0.50    Years: 15.00    Pack years: 7.50    Types: Cigarettes   Smokeless tobacco: Never Used   Tobacco comment: Quit smoking cigarettes in 2002  Substance Use Topics   Alcohol use: Yes    Comment: social   Drug use: No     Colonoscopy: 2010  PAP: 2013  Bone density: 05/15/2009, at [facility], normal, lowest T score -0.8  Lipid panel:  Allergies  Allergen Reactions   Chlorhexidine Itching   Ciprofloxacin Anxiety, Rash and Other (See Comments)    GI upset   Prednisone Shortness Of Breath and Other (See Comments)    "jacks me up" jittery    Codeine Nausea Only   Cortisone Swelling and Other (See Comments)    "  jack me up"     Current Outpatient Medications  Medication Sig Dispense Refill   acetaminophen (TYLENOL) 500 MG tablet Take 1,000 mg by mouth at bedtime as needed for headache.      betamethasone dipropionate (DIPROLENE) 0.05 % cream Apply 1 application topically daily as needed (psoriasis).     bismuth subsalicylate (PEPTO BISMOL) 262 MG chewable tablet Chew 524 mg by mouth as needed for indigestion.     dexamethasone (DECADRON) 4 MG tablet Take 2 tablets (8 mg total) by mouth daily. Start the day after chemotherapy for 2 days. Take with food. 30 tablet 1   fluticasone (FLONASE) 50 MCG/ACT nasal spray SPRAY 2 SPRAYS INTO EACH NOSTRIL EVERY DAY (Patient taking differently: Place 1 spray into both nostrils daily as needed for allergies. ) 48 g 1   lidocaine-prilocaine (EMLA) cream Apply to affected area once 30 g 3   liothyronine (CYTOMEL) 5 MCG tablet Take 5 mcg by mouth daily before breakfast.      loratadine (CLARITIN) 10 MG tablet Take 10 mg  by mouth at bedtime.      metFORMIN (GLUCOPHAGE-XR) 500 MG 24 hr tablet Take 1,000 mg by mouth every evening.     Phenylephrine-Acetaminophen (TYLENOL SINUS+HEADACHE PO) Take 1 tablet by mouth daily as needed (sinus headaches).     prochlorperazine (COMPAZINE) 10 MG tablet Take 1 tablet (10 mg total) by mouth every 6 (six) hours as needed (Nausea or vomiting). 30 tablet 1   propranolol (INDERAL) 10 MG tablet Take 10 mg by mouth daily as needed (heart palpitations).      TIROSINT 100 MCG CAPS Take 100 mcg by mouth daily before breakfast.   4   triamterene-hydrochlorothiazide (MAXZIDE-25) 37.5-25 MG tablet Take 1 tablet by mouth daily. 90 tablet 3   diltiazem (CARDIZEM CD) 240 MG 24 hr capsule Take 1 capsule (240 mg total) by mouth daily. 90 capsule 3   No current facility-administered medications for this visit.    Facility-Administered Medications Ordered in Other Visits  Medication Dose Route Frequency Provider Last Rate Last Dose   sodium chloride flush (NS) 0.9 % injection 10 mL  10 mL Intracatheter PRN Magrinat, Virgie Dad, MD   10 mL at 01/07/19 1606    OBJECTIVE: Vitals:   01/07/19 1314  BP: 131/85  Pulse: (!) 101  Resp: 18  Temp: 98.2 F (36.8 C)  SpO2: 97%     Body mass index is 32.03 kg/m.    ECOG FS:1 - Symptomatic but completely ambulatory  GENERAL: Patient is a well appearing female in no acute distress HEENT:  Sclerae anicteric.  Oropharynx clear and moist. No ulcerations or evidence of oropharyngeal candidiasis. Neck is supple.  NODES:  No cervical, supraclavicular, or axillary lymphadenopathy palpated.  BREAST EXAM:  Tumor noted at previous lumpectomy site approx 1.5-2cm LUNGS:  Clear to auscultation bilaterally.  No wheezes or rhonchi. HEART:  Regular rate and rhythm. No murmur appreciated. ABDOMEN:  Soft, nontender.  Positive, normoactive bowel sounds. No organomegaly palpated. MSK:  No focal spinal tenderness to palpation. Full range of motion bilaterally in  the upper extremities. EXTREMITIES:  No peripheral edema.   SKIN:  Clear with no obvious rashes or skin changes. No nail dyscrasia. NEURO:  Nonfocal. Well oriented.  Appropriate affect.    LAB RESULTS: No results found for: SPEP  Lab Results  Component Value Date   WBC 5.4 01/07/2019   NEUTROABS 3.4 01/07/2019   HGB 11.6 (L) 01/07/2019   HCT 35.0 (L) 01/07/2019  MCV 93.8 01/07/2019   PLT 269 01/07/2019      Chemistry      Component Value Date/Time   NA 142 01/07/2019 1238   NA 140 06/19/2017 1312   K 3.5 01/07/2019 1238   K 3.5 06/19/2017 1312   CL 105 01/07/2019 1238   CO2 22 01/07/2019 1238   CO2 25 06/19/2017 1312   BUN 12 01/07/2019 1238   BUN 18.4 06/19/2017 1312   CREATININE 0.85 01/07/2019 1238   CREATININE 0.8 06/19/2017 1312      Component Value Date/Time   CALCIUM 10.1 01/07/2019 1238   CALCIUM 10.2 06/19/2017 1312   ALKPHOS 65 01/07/2019 1238   ALKPHOS 86 06/19/2017 1312   AST 41 01/07/2019 1238   AST 15 06/19/2017 1312   ALT 73 (H) 01/07/2019 1238   ALT 22 06/19/2017 1312   BILITOT 0.3 01/07/2019 1238   BILITOT 0.26 06/19/2017 1312       No results found for: LABCA2  No components found for: LABCA125  No results for input(s): INR in the last 168 hours.  Urinalysis    Component Value Date/Time   COLORURINE YELLOW 03/13/2009 2251   APPEARANCEUR CLOUDY (A) 03/13/2009 2251   LABSPEC 1.020 06/15/2014 1101   PHURINE 6.0 06/15/2014 1101   PHURINE 5.5 03/13/2009 2251   GLUCOSEU Negative 06/15/2014 1101   HGBUR Negative 06/15/2014 1101   HGBUR NEGATIVE 03/13/2009 2251   BILIRUBINUR Color Interference 06/15/2014 1101   KETONESUR Negative 06/15/2014 1101   KETONESUR NEGATIVE 03/13/2009 2251   PROTEINUR 30 06/15/2014 1101   PROTEINUR NEGATIVE 03/13/2009 2251   UROBILINOGEN 0.2 06/15/2014 1101   NITRITE Negative 06/15/2014 1101   NITRITE NEGATIVE 03/13/2009 2251   LEUKOCYTESUR Trace 06/15/2014 1101    STUDIES: Dg Hips Bilat With Pelvis  3-4 Views  Result Date: 12/24/2018 CLINICAL DATA:  Back pain and hip pain, initial encounter EXAM: DG HIP (WITH OR WITHOUT PELVIS) 4V BILAT COMPARISON:  None. FINDINGS: There is no evidence of hip fracture or dislocation. There is no evidence of arthropathy or other focal bone abnormality. IMPRESSION: No acute abnormality noted. Electronically Signed   By: Inez Catalina M.D.   On: 12/24/2018 19:52    ASSESSMENT: 58 y.o. [place] woman status post left breast upper inner quadrant biopsy 02/08/2014 for a clinical T1c N0, stage IA invasive ductal carcinoma, grade 3, triple negative, with an MIB-1 of 50%.  (1) status post left lumpectomy and sentinel lymph node sampling 03/01/2014 for a pT1c pN0, stage IA invasive ductal carcinoma, grade 3, with negative margins, repeat prognostic panel again triple negative.  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  Left breast with breath hold technique/ 45 Gy at 1.8 Gy per fraction x 25 fractions.   Left breast boost/ 16 Gy at 2 Gy per fraction x 8 fractions  (4) genetics counseling 07/06/2015 through the Breast/Ovarian cancer gene panel offered by [facility] found no deleterious mutations in ATM, BARD1, BRCA1, BRCA2, BRIP1, CDH1, CHEK2, EPCAM, FANCC, MLH1, MSH2, MSH6, NBN, PALB2, PMS2, PTEN, RAD51C, RAD51D, TP53, and XRCC2.  (5) question of sleep apnea  (6) transverse colon lesion noted on CT  scan from 06/17/2014 - negative biopsy, also showed fatty liver  (a) repeat CT scan of the abdomen and pelvis with contrast 02/15/2016  negative  RECURRENT DISEASE: February 2020 (7) biopsy of palpable mass upper inner left breast 11/10/2018 confirms invasive ductal carcinoma, grade 2 or 3, triple negative, with an MIB-1 of 80%.  (a) CT scans of the chest abdomen and pelvis with contrast showed no stage IV disease  (b) bone scan 11/13/2018 shows no definite metastasis to bone  (8) neoadjuvant chemotherapy will consist of carboplatin and gemcitabine given days 1 and 8 of each 21-day cycle, for 6 cycles, started 11/26/2018  (9) bilateral mastectomies with left sentinel lymph node sampling and immediate reconstruction to follow   PLAN: Zennie is doing well today.  Her labs are stable and she is tolerating the gemcitabine/carboplatin without difficulty.  She will proceed with this today.   We reviewed that since it is hard to tell if her tumor is shrinking/or if it is even growing, we will get an ultrasound after 3 cycles of treatment.  We discussed this in detail and risk of exposure with another appointment due to COVID, but after weighing risks and benefits, patient opted to undergo the ultrasound.    Recommended that she continue following COVID social distancing/isolation recommendations.  Mariha will return in one week for labs and her next treatment.  She knows to call for any questions or concerns prior to her next appt.  A total of (30) minutes of face-to-face time was spent with this patient with greater than 50% of that time in counseling and care-coordination.   Wilber Bihari, NP 01/07/19 5:03 PM Medical Oncology and Hematology [facility] [address] [place], [place] [address] Tel. [phone]    Fax. [phone]

## 2019-01-14 ENCOUNTER — Other Ambulatory Visit: Payer: Self-pay

## 2019-01-14 ENCOUNTER — Inpatient Hospital Stay: Payer: No Typology Code available for payment source

## 2019-01-14 VITALS — BP 131/87 | HR 75 | Temp 98.4°F | Resp 19 | Ht 65.0 in | Wt 195.8 lb

## 2019-01-14 DIAGNOSIS — C50912 Malignant neoplasm of unspecified site of left female breast: Secondary | ICD-10-CM

## 2019-01-14 DIAGNOSIS — C50212 Malignant neoplasm of upper-inner quadrant of left female breast: Secondary | ICD-10-CM

## 2019-01-14 DIAGNOSIS — Z5111 Encounter for antineoplastic chemotherapy: Secondary | ICD-10-CM | POA: Diagnosis not present

## 2019-01-14 DIAGNOSIS — Z95828 Presence of other vascular implants and grafts: Secondary | ICD-10-CM

## 2019-01-14 DIAGNOSIS — Z171 Estrogen receptor negative status [ER-]: Secondary | ICD-10-CM

## 2019-01-14 LAB — COMPREHENSIVE METABOLIC PANEL
ALT: 115 U/L — ABNORMAL HIGH (ref 0–44)
AST: 57 U/L — ABNORMAL HIGH (ref 15–41)
Albumin: 4.2 g/dL (ref 3.5–5.0)
Alkaline Phosphatase: 57 U/L (ref 38–126)
Anion gap: 14 (ref 5–15)
BUN: 12 mg/dL (ref 6–20)
CO2: 24 mmol/L (ref 22–32)
Calcium: 9.8 mg/dL (ref 8.9–10.3)
Chloride: 101 mmol/L (ref 98–111)
Creatinine, Ser: 0.75 mg/dL (ref 0.44–1.00)
GFR calc Af Amer: >60 mL/min (ref >60)
GFR calc non Af Amer: >60 mL/min (ref >60)
Glucose, Bld: 86 mg/dL (ref 70–99)
Potassium: 3.5 mmol/L (ref 3.5–5.1)
Sodium: 139 mmol/L (ref 135–145)
Total Bilirubin: 0.2 mg/dL — ABNORMAL LOW (ref 0.3–1.2)
Total Protein: 7.3 g/dL (ref 6.5–8.1)

## 2019-01-14 LAB — CBC WITH DIFFERENTIAL/PLATELET
Abs Immature Granulocytes: 0.51 10*3/uL — ABNORMAL HIGH (ref 0.00–0.07)
Basophils Absolute: 0.1 10*3/uL (ref 0.0–0.1)
Basophils Relative: 2 %
Eosinophils Absolute: 0 10*3/uL (ref 0.0–0.5)
Eosinophils Relative: 1 %
HCT: 31.5 % — ABNORMAL LOW (ref 36.0–46.0)
Hemoglobin: 10.4 g/dL — ABNORMAL LOW (ref 12.0–15.0)
Immature Granulocytes: 15 %
Lymphocytes Relative: 37 %
Lymphs Abs: 1.3 10*3/uL (ref 0.7–4.0)
MCH: 31.1 pg (ref 26.0–34.0)
MCHC: 33 g/dL (ref 30.0–36.0)
MCV: 94.3 fL (ref 80.0–100.0)
Monocytes Absolute: 0.2 10*3/uL (ref 0.1–1.0)
Monocytes Relative: 6 %
Neutro Abs: 1.4 10*3/uL — ABNORMAL LOW (ref 1.7–7.7)
Neutrophils Relative %: 39 %
Platelets: 345 10*3/uL (ref 150–400)
RBC: 3.34 MIL/uL — ABNORMAL LOW (ref 3.87–5.11)
RDW: 14.1 % (ref 11.5–15.5)
WBC: 3.5 10*3/uL — ABNORMAL LOW (ref 4.0–10.5)
nRBC: 0.9 % — ABNORMAL HIGH (ref 0.0–0.2)

## 2019-01-14 MED ORDER — SODIUM CHLORIDE 0.9% FLUSH
10.0000 mL | INTRAVENOUS | Status: DC | PRN
  Administered 2019-01-14: 10 mL
  Filled 2019-01-14: qty 10

## 2019-01-14 MED ORDER — HEPARIN SOD (PORK) LOCK FLUSH 100 UNIT/ML IV SOLN
500.0000 [IU] | Freq: Once | INTRAVENOUS | Status: AC | PRN
  Administered 2019-01-14: 500 [IU]
  Filled 2019-01-14: qty 5

## 2019-01-14 MED ORDER — SODIUM CHLORIDE 0.9 % IV SOLN
Freq: Once | INTRAVENOUS | Status: AC
  Administered 2019-01-14: 10:00:00 via INTRAVENOUS
  Filled 2019-01-14: qty 250

## 2019-01-14 MED ORDER — PALONOSETRON HCL INJECTION 0.25 MG/5ML
0.2500 mg | Freq: Once | INTRAVENOUS | Status: AC
  Administered 2019-01-14: 0.25 mg via INTRAVENOUS

## 2019-01-14 MED ORDER — DEXAMETHASONE SODIUM PHOSPHATE 10 MG/ML IJ SOLN
INTRAMUSCULAR | Status: AC
  Filled 2019-01-14: qty 1

## 2019-01-14 MED ORDER — DEXAMETHASONE SODIUM PHOSPHATE 10 MG/ML IJ SOLN
10.0000 mg | Freq: Once | INTRAMUSCULAR | Status: AC
  Administered 2019-01-14: 10:00:00 10 mg via INTRAVENOUS

## 2019-01-14 MED ORDER — PALONOSETRON HCL INJECTION 0.25 MG/5ML
INTRAVENOUS | Status: AC
  Filled 2019-01-14: qty 5

## 2019-01-14 MED ORDER — SODIUM CHLORIDE 0.9% FLUSH
10.0000 mL | INTRAVENOUS | Status: DC | PRN
  Administered 2019-01-14: 09:00:00 10 mL
  Filled 2019-01-14: qty 10

## 2019-01-14 MED ORDER — SODIUM CHLORIDE 0.9 % IV SOLN
2000.0000 mg | Freq: Once | INTRAVENOUS | Status: AC
  Administered 2019-01-14: 2000 mg via INTRAVENOUS
  Filled 2019-01-14: qty 52.6

## 2019-01-14 MED ORDER — SODIUM CHLORIDE 0.9 % IV SOLN
250.6000 mg | Freq: Once | INTRAVENOUS | Status: AC
  Administered 2019-01-14: 250 mg via INTRAVENOUS
  Filled 2019-01-14: qty 25

## 2019-01-14 NOTE — Progress Notes (Signed)
Per Dr. Jana Hakim, ok to treat with ANC 1.4 and ALT 115.

## 2019-01-14 NOTE — Patient Instructions (Signed)
[facility] Discharge Instructions for Patients Receiving Chemotherapy  Today you received the following chemotherapy agents Gemcitabine (GEMZAR) & Carboplatin (PARAPLATIN).  To help prevent nausea and vomiting after your treatment, we encourage you to take your nausea medication as prescribed.   If you develop nausea and vomiting that is not controlled by your nausea medication, call the clinic.   BELOW ARE SYMPTOMS THAT SHOULD BE REPORTED IMMEDIATELY:  *FEVER GREATER THAN 100.5 F  *CHILLS WITH OR WITHOUT FEVER  NAUSEA AND VOMITING THAT IS NOT CONTROLLED WITH YOUR NAUSEA MEDICATION  *UNUSUAL SHORTNESS OF BREATH  *UNUSUAL BRUISING OR BLEEDING  TENDERNESS IN MOUTH AND THROAT WITH OR WITHOUT PRESENCE OF ULCERS  *URINARY PROBLEMS  *BOWEL PROBLEMS  UNUSUAL RASH Items with * indicate a potential emergency and should be followed up as soon as possible.  Feel free to call the clinic should you have any questions or concerns. The clinic phone number is [phone].  Please show the CHEMO ALERT CARD at check-in to the Emergency Department and triage nurse.   

## 2019-01-25 ENCOUNTER — Other Ambulatory Visit: Payer: Self-pay

## 2019-01-25 ENCOUNTER — Ambulatory Visit
Admission: RE | Admit: 2019-01-25 | Discharge: 2019-01-25 | Disposition: A | Discharge status: Home / Self Care | Payer: 59 | Source: Ambulatory Visit | Attending: Adult Health | Admitting: Adult Health

## 2019-01-25 DIAGNOSIS — Z171 Estrogen receptor negative status [ER-]: Principal | ICD-10-CM

## 2019-01-25 DIAGNOSIS — C50212 Malignant neoplasm of upper-inner quadrant of left female breast: Secondary | ICD-10-CM

## 2019-01-27 NOTE — Progress Notes (Addendum)
[place]  [phone] [phone]) [phone]    ID: [name] OB: Apr 28, 1961  MR#: [id number]  QHU#:[id number]  Patient Care Team: [name], DO as PCP - General (Family Medicine) Josue Hector, MD as PCP - Cardiology (Cardiology) Hamid Brookens, Virgie Dad, MD as Consulting Physician (Oncology) Brien Few, MD as Consulting Physician (Obstetrics and Gynecology) [name], MD as Consulting Physician (General Surgery) [name], MD as Referring Physician (Radiation Oncology) Delrae Rend, MD as Consulting Physician (Endocrinology) Wilford Corner, MD as Consulting Physician (Gastroenterology) Dorna Leitz, MD as Consulting Physician (Orthopedic Surgery) Josue Hector, MD as Consulting Physician (Cardiology) OTHER MD:    CHIEF COMPLAINT: Triple negative breast cancer, locally recurrent  CURRENT TREATMENT: Neoadjuvant chemotherapy   INTERVAL HISTORY: Alexandria Bradley was seen today for follow-up and treatment of her locally recurrent triple negative breast cancer.  She continues on neoadjuvant chemotherapy consisting of carboplatin and gemcitabine given days 1 and 8 of each 21-day cycle, for 6 cycles. Today is day 1 cycle 4. She notes that she is having some issues with her stomach. She takes Prilosec; she has changed the time that she takes this, which she believes settles her stomach a bit better. She notes a history of ulcers. She has some nausea without vomiting; the nausea is there in the mornings most days. She treats with compazine, which she believes helps some. She has stopped taking Claritin because she was noting that the area around her kidney began to hurt her; this has began to resolve since stopping the medication. She has some diarrhea from her supportive medications, which she treats with over the counter medications.  Since her last visit here, she underwent a left breast ultrasound on 01/25/2019 showing: Targeted  ultrasound is performed, showing a 1.1 x 1.2 x 1.3 cm irregular hypoechoic mass compatible with biopsy-proven malignancy left breast 10 o'clock position 7 cm from nipple. This mass is similar in size when compared to prior exam where it measured 1.4 x 1.2 x 1.3 cm.   REVIEW OF SYSTEMS: [name] has been taking appropriate COVID-19 precautions. Her workplace was closed down, so she hasn't been to work. She doesn't really go outside unless she has to, like for doctors appointments. [name] has not been very active. She notes that her legs give her some issues with soreness that make it difficult for her to do exercise. Prior to the virus, she liked to walk for exercise, she's nervous about going outside.   The patient denies unusual visual changes, vomiting, or dizziness. There has been no unusual cough, phlegm production, or pleurisy. This been no change in bladder habits. The patient denies unexplained weight loss, bleeding, rash, or fever. A detailed review of systems was otherwise noncontributory.    BREAST CANCER HISTORY: From the original intake note of 02/16/2014:  [name] palpated a mass in her left breast early May and brought it immediately to her gynecologist attention. He set her up for bilateral diagnostic mammography and left ultrasonography at [facility] 02/07/2014. Mammography showed a 1.3 cm oval mass with spiculated margins in the left breast at the 11:00 position. This was palpable. Ultrasound showed a 1.1 cm tall her than wide mass with a microlobulated margins. He was hypoechoic. There were no abnormalities noted in the left axilla.  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 Lyndon Code at conference 02/16/2014), triple negative, with an MIB-1  of 50%.  The patient's subsequent history is as detailed below   PAST MEDICAL HISTORY: Past Medical History:  Diagnosis Date    Anxiety    Arthritis    Back pain    Breast cancer ([place])    left   Dyspnea    Fatty liver    GERD (gastroesophageal reflux disease)    Headache(784.0)    PMH : migraines   History of kidney stones    Hypercholesterolemia    Hypertension    Hypothyroidism    Kidney stones    Osteoporosis    Personal history of chemotherapy    Personal history of radiation therapy    PONV (postoperative nausea and vomiting)    difficult to wake up   Pre-diabetes    Psoriasis    PVC's (premature ventricular contractions)    Radiation 08/18/14-10/07/14   Left breast    PAST SURGICAL HISTORY: Past Surgical History:  Procedure Laterality Date   BREAST BIOPSY Left 11/10/2018   BREAST LUMPECTOMY Left 2015   BREAST LUMPECTOMY WITH AXILLARY LYMPH NODE BIOPSY  6/15   left   CARDIAC CATHETERIZATION     CHOLECYSTECTOMY     COLONOSCOPY N/A 06/27/2014   Procedure: COLONOSCOPY;  Surgeon: Lear Ng, MD;  Location: [place];  Service: Endoscopy;  Laterality: N/A;   LEFT HEART CATH AND CORONARY ANGIOGRAPHY N/A 07/24/2018   Procedure: LEFT HEART CATH AND CORONARY ANGIOGRAPHY;  Surgeon: Troy Sine, MD;  Location: [facility];  Service: Cardiovascular;  Laterality: N/A;   LIPOMA EXCISION     MYOMECTOMY     PORT-A-CATH REMOVAL N/A 02/03/2015   Procedure: REMOVAL PORT-A-CATH;  Surgeon: [name], MD;  Location: [facility];  Service: General;  Laterality: N/A;   PORTACATH PLACEMENT N/A 03/01/2014   Procedure: INSERTION PORT-A-CATH;  Surgeon: [name], MD;  Location: [place];  Service: General;  Laterality: N/A;   PORTACATH PLACEMENT N/A 11/18/2018   Procedure: INSERTION PORT-A-CATH WITH ULTRASOUND;  Surgeon: [name], MD;  Location: [place];  Service: General;  Laterality: N/A;   TONSILLECTOMY     TUBAL LIGATION      FAMILY HISTORY: Family History  Problem Relation Age of Onset   Endometrial  cancer Mother 65   Hearing loss Mother    Atrial fibrillation Mother    Lung cancer Father    Brain cancer Father    Heart disease Maternal Aunt    Atrial fibrillation Maternal Aunt    Lung cancer Maternal Uncle    Atrial fibrillation Maternal Uncle    Stroke Maternal Grandmother    Stroke Maternal Grandfather    Bladder Cancer Maternal Uncle    Multiple myeloma Maternal Uncle    Other Son        trisomy 52   The patient's father died at the age of 36 from what may have been metastatic lung cancer. The patient knows very little about the father's side of her family. Her mother is alive at age 32. She was recently diagnosed with endometrial cancer. The patient has one brother, 2 sisters. There is no history of breast or ovarian cancer in the family.   GYNECOLOGIC HISTORY:  Menarche age 18, first live birth age 5. The patient is GX P3. One child died shortly after birth. She stopped having periods approximately 2005. She did not use hormone replacement. 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 Son [name] is  Nurse, adult in [place]. Daughter [name] lives in [place] and works for [employer] as an Therapist, sports. She has three grandchildren.    ADVANCED DIRECTIVES: Not in place   HEALTH MAINTENANCE: Social History   Tobacco Use   Smoking status: Former Smoker    Packs/day: 0.50    Years: 15.00    Pack years: 7.50    Types: Cigarettes   Smokeless tobacco: Never Used   Tobacco comment: Quit smoking cigarettes in 2002  Substance Use Topics   Alcohol use: Yes    Comment: social   Drug use: No     Colonoscopy: 2010  PAP: 2013  Bone density: 05/15/2009, at [facility], normal, lowest T  score -0.8  Lipid panel:  Allergies  Allergen Reactions   Chlorhexidine Itching   Ciprofloxacin Anxiety, Rash and Other (See Comments)    GI upset   Prednisone Shortness Of Breath and Other (See Comments)    "jacks me up" jittery    Codeine Nausea Only   Cortisone Swelling and Other (See Comments)    "jack me up"     Current Outpatient Medications  Medication Sig Dispense Refill   acetaminophen (TYLENOL) 500 MG tablet Take 1,000 mg by mouth at bedtime as needed for headache.      betamethasone dipropionate (DIPROLENE) 0.05 % cream Apply 1 application topically daily as needed (psoriasis).     bismuth subsalicylate (PEPTO BISMOL) 262 MG chewable tablet Chew 524 mg by mouth as needed for indigestion.     dexamethasone (DECADRON) 4 MG tablet Take 2 tablets (8 mg total) by mouth daily. Start the day after chemotherapy for 2 days. Take with food. 30 tablet 1   diltiazem (CARDIZEM CD) 240 MG 24 hr capsule Take 1 capsule (240 mg total) by mouth daily. 90 capsule 3   fluticasone (FLONASE) 50 MCG/ACT nasal spray SPRAY 2 SPRAYS INTO EACH NOSTRIL EVERY DAY (Patient taking differently: Place 1 spray into both nostrils daily as needed for allergies. ) 48 g 1   lidocaine-prilocaine (EMLA) cream Apply to affected area once 30 g 3   liothyronine (CYTOMEL) 5 MCG tablet Take 5 mcg by mouth daily before breakfast.      loratadine (CLARITIN) 10 MG tablet Take 10 mg by mouth at bedtime.      metFORMIN (GLUCOPHAGE-XR) 500 MG 24 hr tablet Take 1,000 mg by mouth every evening.     Phenylephrine-Acetaminophen (TYLENOL SINUS+HEADACHE PO) Take 1 tablet by mouth daily as needed (sinus headaches).     prochlorperazine (COMPAZINE) 10 MG tablet Take 1 tablet (10 mg total) by mouth every 6 (six) hours as needed (Nausea or vomiting). 30 tablet 1   propranolol (INDERAL) 10 MG tablet Take 10 mg by mouth daily as needed (heart palpitations).      TIROSINT 100 MCG CAPS Take 100 mcg by mouth daily  before breakfast.   4   triamterene-hydrochlorothiazide (MAXZIDE-25) 37.5-25 MG tablet Take 1 tablet by mouth daily. 90 tablet 3   No current facility-administered medications for this visit.     OBJECTIVE: Middle-aged white woman who appears stated age 58:   01/28/19 0828  BP: 138/81  Pulse: 91  Resp: 19  Temp: 98 F (36.7 C)  SpO2: 98%     Body mass index is 33.02 kg/m.    ECOG FS:1 -  Symptomatic but completely ambulatory  Sclerae unicteric, EOMs intact Wearing a mask No cervical or supraclavicular adenopathy Lungs no rales or rhonchi Heart regular rate and rhythm Abd soft, nontender, positive bowel sounds MSK no focal spinal tenderness, no upper extremity lymphedema Neuro: nonfocal, well oriented, appropriate affect Breasts: Deferred     LAB RESULTS: No results found for: SPEP  Lab Results  Component Value Date   WBC 3.4 (L) 01/28/2019   NEUTROABS PENDING 01/28/2019   HGB 9.2 (L) 01/28/2019   HCT 28.3 (L) 01/28/2019   MCV 95.9 01/28/2019   PLT 301 01/28/2019      Chemistry      Component Value Date/Time   NA 139 01/14/2019 0857   NA 140 06/19/2017 1312   K 3.5 01/14/2019 0857   K 3.5 06/19/2017 1312   CL 101 01/14/2019 0857   CO2 24 01/14/2019 0857   CO2 25 06/19/2017 1312   BUN 12 01/14/2019 0857   BUN 18.4 06/19/2017 1312   CREATININE 0.75 01/14/2019 0857   CREATININE 0.8 06/19/2017 1312      Component Value Date/Time   CALCIUM 9.8 01/14/2019 0857   CALCIUM 10.2 06/19/2017 1312   ALKPHOS 57 01/14/2019 0857   ALKPHOS 86 06/19/2017 1312   AST 57 (H) 01/14/2019 0857   AST 15 06/19/2017 1312   ALT 115 (H) 01/14/2019 0857   ALT 22 06/19/2017 1312   BILITOT 0.2 (L) 01/14/2019 0857   BILITOT 0.26 06/19/2017 1312       No results found for: LABCA2  No components found for: [id number]  No results for input(s): INR in the last 168 hours.  Urinalysis    Component Value Date/Time   COLORURINE YELLOW 03/13/2009 2251   APPEARANCEUR CLOUDY  (A) 03/13/2009 2251   LABSPEC 1.020 06/15/2014 1101   PHURINE 6.0 06/15/2014 1101   PHURINE 5.5 03/13/2009 2251   GLUCOSEU Negative 06/15/2014 1101   HGBUR Negative 06/15/2014 1101   HGBUR NEGATIVE 03/13/2009 2251   BILIRUBINUR Color Interference 06/15/2014 1101   KETONESUR Negative 06/15/2014 1101   KETONESUR NEGATIVE 03/13/2009 2251   PROTEINUR 30 06/15/2014 1101   PROTEINUR NEGATIVE 03/13/2009 2251   UROBILINOGEN 0.2 06/15/2014 1101   NITRITE Negative 06/15/2014 1101   NITRITE NEGATIVE 03/13/2009 2251   LEUKOCYTESUR Trace 06/15/2014 1101    STUDIES: US Breast Ltd Uni Left Inc Axilla  Result Date: 01/25/2019 CLINICAL DATA:  Patient with history of left-sided breast cancer. Evaluate for interval change in size on neoadjuvant chemotherapy. EXAM: ULTRASOUND OF THE LEFT BREAST COMPARISON:  Previous exam(s). FINDINGS: Targeted ultrasound is performed, showing a 1.1 x 1.2 x 1.3 cm irregular hypoechoic mass compatible with biopsy-proven malignancy left breast 10 o'clock position 7 cm from nipple. This mass is similar in size when compared to prior exam where it measured 1.4 x 1.2 x 1.3 cm. IMPRESSION: Grossly similar size of left breast malignancy when compared on ultrasound. RECOMMENDATION: Continue with treatment plan for known left breast malignancy. I have discussed the findings and recommendations with the patient. Results were also provided in writing at the conclusion of the visit. If applicable, a reminder letter will be sent to the patient regarding the next appointment. BI-RADS CATEGORY  6: Known biopsy-proven malignancy. Electronically Signed   By: Lovey Newcomer M.D.   On: 01/25/2019 07:59    ASSESSMENT: 58 y.o. [place] woman status post left breast upper inner quadrant biopsy 02/08/2014 for a clinical T1c N0, stage IA invasive ductal carcinoma, grade 3, triple negative, with an MIB-1  of 50%.  (1) status post left lumpectomy and sentinel lymph node sampling 03/01/2014 for a pT1c pN0,  stage IA invasive ductal carcinoma, grade 3, with negative margins, repeat prognostic panel again triple negative.  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  Left breast with breath hold technique/ 45 Gy at 1.8 Gy per fraction x 25 fractions.   Left breast boost/ 16 Gy at 2 Gy per fraction x 8 fractions  (4) genetics counseling 07/06/2015 through the Breast/Ovarian cancer gene panel offered by [facility] found no deleterious mutations in ATM, BARD1, BRCA1, BRCA2, BRIP1, CDH1, CHEK2, EPCAM, FANCC, MLH1, MSH2, MSH6, NBN, PALB2, PMS2, PTEN, RAD51C, RAD51D, TP53, and XRCC2.  (5) question of sleep apnea  (6) transverse colon lesion noted on CT scan from 06/17/2014 - negative biopsy, also showed fatty liver  (a) repeat CT scan of the abdomen and pelvis with contrast 02/15/2016 negative  RECURRENT DISEASE: February 2020 (7) biopsy of palpable mass upper inner left breast 11/10/2018 confirms invasive ductal carcinoma, grade 2 or 3, triple negative, with an MIB-1 of 80%.  (a) CT scans of the chest abdomen and pelvis with contrast showed no stage IV disease  (b) bone scan 11/13/2018 shows no definite metastasis to bone  (8) neoadjuvant chemotherapy will consist of carboplatin and gemcitabine given days 1 and 8 of each 21-day cycle, for 6 cycles, started 11/26/2018  (a) repeat ultrasound 01/25/2019 (after 3 cycles) shows no change in measurable breast mass  (b) pembrolizumab to be added beginning 02/04/2019  (c) repeat ultrasound preop to assess pembrolizumab  response  (9) bilateral mastectomies with left sentinel lymph node sampling and immediate reconstruction to follow   PLAN: Velora is tolerating chemotherapy moderately well.  She continues to have reflux and possibly ulcer symptoms and she does have a history of both those problems.  I am increasing her Prilosec to twice daily.  We reviewed the results of the ultrasound.  These are favorable in the sense that the tumor has not grown.  However there has not been a significant response.  Our concern of course is not the tumor in the breast since we are going to be removing that.  Our concern is the occult disease that may be outside the breast.  For that reason it would be encouraging if we would see some response in her measurable disease.  After much discussion we decided to add pembrolizumab to her current treatment.  She will receive the first dose a week from now, with a day 8 treatment.  Assuming she tolerates it well she will receive it with each subsequent cycle on day 8 and then after 3 cycles we will repeat the breast ultrasound simply to find out if there has been a response.  If we document her response to pembrolizumab I will probably continue that for total of 6 months right through the surgery and radiation.  She is very aware of the possible toxicities side effects and complications of this agent which we discussed in detail today  She will call with any other issues that may develop before the next visit.    , Virgie Dad, MD  01/28/19 8:55 AM Medical Oncology and Hematology [facility] [address] [place], [place] [address] Tel. [phone]    Fax. [phone]  I, Jacqualyn Posey am acting as a Education administrator for Chauncey Cruel, MD.   I, Lurline Del MD, have reviewed the above documentation for accuracy and completeness, and I agree with the above.  ADDENDUM: Given the low ANC today, we are adding Granix on 5/4 and 5/11.

## 2019-01-28 ENCOUNTER — Other Ambulatory Visit: Payer: 59

## 2019-01-28 ENCOUNTER — Inpatient Hospital Stay: Payer: No Typology Code available for payment source

## 2019-01-28 ENCOUNTER — Inpatient Hospital Stay (HOSPITAL_BASED_OUTPATIENT_CLINIC_OR_DEPARTMENT_OTHER): Payer: No Typology Code available for payment source | Admitting: Oncology

## 2019-01-28 ENCOUNTER — Other Ambulatory Visit: Payer: Self-pay

## 2019-01-28 VITALS — BP 138/81 | HR 91 | Temp 98.0°F | Resp 19 | Ht 65.0 in | Wt 198.4 lb

## 2019-01-28 DIAGNOSIS — C50912 Malignant neoplasm of unspecified site of left female breast: Secondary | ICD-10-CM

## 2019-01-28 DIAGNOSIS — C50212 Malignant neoplasm of upper-inner quadrant of left female breast: Secondary | ICD-10-CM

## 2019-01-28 DIAGNOSIS — Z9013 Acquired absence of bilateral breasts and nipples: Secondary | ICD-10-CM

## 2019-01-28 DIAGNOSIS — Z5111 Encounter for antineoplastic chemotherapy: Secondary | ICD-10-CM | POA: Diagnosis not present

## 2019-01-28 DIAGNOSIS — Z79899 Other long term (current) drug therapy: Secondary | ICD-10-CM

## 2019-01-28 DIAGNOSIS — Z171 Estrogen receptor negative status [ER-]: Secondary | ICD-10-CM | POA: Diagnosis not present

## 2019-01-28 DIAGNOSIS — Z923 Personal history of irradiation: Secondary | ICD-10-CM | POA: Diagnosis not present

## 2019-01-28 DIAGNOSIS — Z95828 Presence of other vascular implants and grafts: Secondary | ICD-10-CM

## 2019-01-28 LAB — COMPREHENSIVE METABOLIC PANEL
ALT: 68 U/L — ABNORMAL HIGH (ref 0–44)
AST: 40 U/L (ref 15–41)
Albumin: 4.3 g/dL (ref 3.5–5.0)
Alkaline Phosphatase: 59 U/L (ref 38–126)
Anion gap: 16 — ABNORMAL HIGH (ref 5–15)
BUN: 11 mg/dL (ref 6–20)
CO2: 23 mmol/L (ref 22–32)
Calcium: 9.7 mg/dL (ref 8.9–10.3)
Chloride: 103 mmol/L (ref 98–111)
Creatinine, Ser: 0.79 mg/dL (ref 0.44–1.00)
GFR calc Af Amer: >60 mL/min (ref >60)
GFR calc non Af Amer: >60 mL/min (ref >60)
Glucose, Bld: 101 mg/dL — ABNORMAL HIGH (ref 70–99)
Potassium: 3.4 mmol/L — ABNORMAL LOW (ref 3.5–5.1)
Sodium: 142 mmol/L (ref 135–145)
Total Bilirubin: 0.3 mg/dL (ref 0.3–1.2)
Total Protein: 7.2 g/dL (ref 6.5–8.1)

## 2019-01-28 LAB — CBC WITH DIFFERENTIAL/PLATELET
Abs Immature Granulocytes: 0.05 10*3/uL (ref 0.00–0.07)
Basophils Absolute: 0 10*3/uL (ref 0.0–0.1)
Basophils Relative: 1 %
Eosinophils Absolute: 0.1 10*3/uL (ref 0.0–0.5)
Eosinophils Relative: 3 %
HCT: 28.3 % — ABNORMAL LOW (ref 36.0–46.0)
Hemoglobin: 9.2 g/dL — ABNORMAL LOW (ref 12.0–15.0)
Immature Granulocytes: 2 %
Lymphocytes Relative: 36 %
Lymphs Abs: 1.3 10*3/uL (ref 0.7–4.0)
MCH: 31.2 pg (ref 26.0–34.0)
MCHC: 32.5 g/dL (ref 30.0–36.0)
MCV: 95.9 fL (ref 80.0–100.0)
Monocytes Absolute: 0.7 10*3/uL (ref 0.1–1.0)
Monocytes Relative: 22 %
Neutro Abs: 1.2 10*3/uL — ABNORMAL LOW (ref 1.7–7.7)
Neutrophils Relative %: 36 %
Platelets: 301 10*3/uL (ref 150–400)
RBC: 2.95 MIL/uL — ABNORMAL LOW (ref 3.87–5.11)
RDW: 17.2 % — ABNORMAL HIGH (ref 11.5–15.5)
WBC: 3.4 10*3/uL — ABNORMAL LOW (ref 4.0–10.5)
nRBC: 2 % — ABNORMAL HIGH (ref 0.0–0.2)

## 2019-01-28 MED ORDER — SODIUM CHLORIDE 0.9 % IV SOLN
Freq: Once | INTRAVENOUS | Status: AC
  Administered 2019-01-28: 09:00:00 via INTRAVENOUS
  Filled 2019-01-28: qty 250

## 2019-01-28 MED ORDER — SODIUM CHLORIDE 0.9% FLUSH
10.0000 mL | INTRAVENOUS | Status: DC | PRN
  Administered 2019-01-28: 10 mL
  Filled 2019-01-28: qty 10

## 2019-01-28 MED ORDER — SODIUM CHLORIDE 0.9 % IV SOLN
250.6000 mg | Freq: Once | INTRAVENOUS | Status: AC
  Administered 2019-01-28: 250 mg via INTRAVENOUS
  Filled 2019-01-28: qty 25

## 2019-01-28 MED ORDER — OMEPRAZOLE 20 MG PO CPDR: 20.0000 mg | DELAYED_RELEASE_CAPSULE | Freq: Two times a day (BID) | ORAL | 6 refills | Status: DC

## 2019-01-28 MED ORDER — SODIUM CHLORIDE 0.9 % IV SOLN
2000.0000 mg | Freq: Once | INTRAVENOUS | Status: AC
  Administered 2019-01-28: 11:00:00 2000 mg via INTRAVENOUS
  Filled 2019-01-28: qty 52.6

## 2019-01-28 MED ORDER — PALONOSETRON HCL INJECTION 0.25 MG/5ML
0.2500 mg | Freq: Once | INTRAVENOUS | Status: AC
  Administered 2019-01-28: 0.25 mg via INTRAVENOUS

## 2019-01-28 MED ORDER — DEXAMETHASONE SODIUM PHOSPHATE 10 MG/ML IJ SOLN
INTRAMUSCULAR | Status: AC
  Filled 2019-01-28: qty 1

## 2019-01-28 MED ORDER — PALONOSETRON HCL INJECTION 0.25 MG/5ML
INTRAVENOUS | Status: AC
  Filled 2019-01-28: qty 5

## 2019-01-28 MED ORDER — DEXAMETHASONE SODIUM PHOSPHATE 10 MG/ML IJ SOLN
10.0000 mg | Freq: Once | INTRAMUSCULAR | Status: AC
  Administered 2019-01-28: 10 mg via INTRAVENOUS

## 2019-01-28 MED ORDER — ONDANSETRON HCL 8 MG PO TABS: 8.0000 mg | ORAL_TABLET | Freq: Three times a day (TID) | ORAL | 0 refills | Status: DC | PRN

## 2019-01-28 MED ORDER — HEPARIN SOD (PORK) LOCK FLUSH 100 UNIT/ML IV SOLN
500.0000 [IU] | Freq: Once | INTRAVENOUS | Status: AC | PRN
  Administered 2019-01-28: 12:00:00 500 [IU]
  Filled 2019-01-28: qty 5

## 2019-01-28 NOTE — Addendum Note (Signed)
Addended by: Chauncey Cruel on: 01/28/2019 10:03 AM   Modules accepted: Orders

## 2019-01-28 NOTE — Progress Notes (Signed)
Okay to treat with ANC 1.2 per Dr. Magrinat. 

## 2019-01-28 NOTE — Patient Instructions (Signed)
[facility] Discharge Instructions for Patients Receiving Chemotherapy  Today you received the following chemotherapy agents Gemcitabine (GEMZAR) & Carboplatin (PARAPLATIN).  To help prevent nausea and vomiting after your treatment, we encourage you to take your nausea medication as prescribed.   If you develop nausea and vomiting that is not controlled by your nausea medication, call the clinic.   BELOW ARE SYMPTOMS THAT SHOULD BE REPORTED IMMEDIATELY:  *FEVER GREATER THAN 100.5 F  *CHILLS WITH OR WITHOUT FEVER  NAUSEA AND VOMITING THAT IS NOT CONTROLLED WITH YOUR NAUSEA MEDICATION  *UNUSUAL SHORTNESS OF BREATH  *UNUSUAL BRUISING OR BLEEDING  TENDERNESS IN MOUTH AND THROAT WITH OR WITHOUT PRESENCE OF ULCERS  *URINARY PROBLEMS  *BOWEL PROBLEMS  UNUSUAL RASH Items with * indicate a potential emergency and should be followed up as soon as possible.  Feel free to call the clinic should you have any questions or concerns. The clinic phone number is [phone].  Please show the CHEMO ALERT CARD at check-in to the Emergency Department and triage nurse.   

## 2019-01-29 ENCOUNTER — Telehealth: Payer: Self-pay | Admitting: Oncology

## 2019-01-29 NOTE — Telephone Encounter (Signed)
Scheduled appt for 5/4 and 5/11 per sch msg. Called patient. No answer. Left message.

## 2019-02-01 ENCOUNTER — Inpatient Hospital Stay: Payer: No Typology Code available for payment source | Attending: Oncology

## 2019-02-01 ENCOUNTER — Telehealth: Payer: Self-pay | Admitting: *Deleted

## 2019-02-01 ENCOUNTER — Other Ambulatory Visit: Payer: Self-pay

## 2019-02-01 ENCOUNTER — Other Ambulatory Visit: Payer: Self-pay | Admitting: Oncology

## 2019-02-01 VITALS — BP 130/71 | HR 89 | Temp 97.9°F | Resp 18

## 2019-02-01 DIAGNOSIS — Z923 Personal history of irradiation: Secondary | ICD-10-CM | POA: Insufficient documentation

## 2019-02-01 DIAGNOSIS — Z9221 Personal history of antineoplastic chemotherapy: Secondary | ICD-10-CM | POA: Insufficient documentation

## 2019-02-01 DIAGNOSIS — Z5111 Encounter for antineoplastic chemotherapy: Secondary | ICD-10-CM | POA: Diagnosis not present

## 2019-02-01 DIAGNOSIS — Z9013 Acquired absence of bilateral breasts and nipples: Secondary | ICD-10-CM | POA: Diagnosis not present

## 2019-02-01 DIAGNOSIS — E876 Hypokalemia: Secondary | ICD-10-CM | POA: Diagnosis not present

## 2019-02-01 DIAGNOSIS — Z171 Estrogen receptor negative status [ER-]: Secondary | ICD-10-CM | POA: Insufficient documentation

## 2019-02-01 DIAGNOSIS — Z95828 Presence of other vascular implants and grafts: Secondary | ICD-10-CM

## 2019-02-01 DIAGNOSIS — C50212 Malignant neoplasm of upper-inner quadrant of left female breast: Secondary | ICD-10-CM | POA: Insufficient documentation

## 2019-02-01 DIAGNOSIS — D709 Neutropenia, unspecified: Secondary | ICD-10-CM | POA: Diagnosis not present

## 2019-02-01 MED ORDER — FILGRASTIM-SNDZ 480 MCG/0.8ML IJ SOSY
480.0000 ug | PREFILLED_SYRINGE | Freq: Once | INTRAMUSCULAR | Status: AC
  Administered 2019-02-01: 480 ug via SUBCUTANEOUS
  Filled 2019-02-01: qty 0.8

## 2019-02-01 NOTE — Telephone Encounter (Signed)
Requisition with data faxed to [facility].

## 2019-02-01 NOTE — Progress Notes (Signed)
CARIS requested 02/01/2019

## 2019-02-01 NOTE — Patient Instructions (Signed)
Filgrastim, G-CSF injection What is this medicine? FILGRASTIM, G-CSF (fil GRA stim) is a granulocyte colony-stimulating factor that stimulates the growth of neutrophils, a type of white blood cell (WBC) important in the body's fight against infection. It is used to reduce the incidence of fever and infection in patients with certain types of cancer who are receiving chemotherapy that affects the bone marrow, to stimulate blood cell production for removal of WBCs from the body prior to a bone marrow transplantation, to reduce the incidence of fever and infection in patients who have severe chronic neutropenia, and to improve survival outcomes following high-dose radiation exposure that is toxic to the bone marrow. This medicine may be used for other purposes; ask your health care provider or pharmacist if you have questions. COMMON BRAND NAME(S): Neupogen, Nivestym, Zarxio What should I tell my health care provider before I take this medicine? They need to know if you have any of these conditions: -kidney disease -latex allergy -ongoing radiation therapy -sickle cell disease -an unusual or allergic reaction to filgrastim, pegfilgrastim, other medicines, foods, dyes, or preservatives -pregnant or trying to get pregnant -breast-feeding How should I use this medicine? This medicine is for injection under the skin or infusion into a vein. As an infusion into a vein, it is usually given by a health care professional in a hospital or clinic setting. If you get this medicine at home, you will be taught how to prepare and give this medicine. Refer to the Instructions for Use that come with your medication packaging. Use exactly as directed. Take your medicine at regular intervals. Do not take your medicine more often than directed. It is important that you put your used needles and syringes in a special sharps container. Do not put them in a trash can. If you do not have a sharps container, call your  pharmacist or healthcare provider to get one. Talk to your pediatrician regarding the use of this medicine in children. While this drug may be prescribed for children as young as 7 months for selected conditions, precautions do apply. Overdosage: If you think you have taken too much of this medicine contact a poison control center or emergency room at once. NOTE: This medicine is only for you. Do not share this medicine with others. What if I miss a dose? It is important not to miss your dose. Call your doctor or health care professional if you miss a dose. What may interact with this medicine? This medicine may interact with the following medications: -medicines that may cause a release of neutrophils, such as lithium This list may not describe all possible interactions. Give your health care provider a list of all the medicines, herbs, non-prescription drugs, or dietary supplements you use. Also tell them if you smoke, drink alcohol, or use illegal drugs. Some items may interact with your medicine. What should I watch for while using this medicine? You may need blood work done while you are taking this medicine. What side effects may I notice from receiving this medicine? Side effects that you should report to your doctor or health care professional as soon as possible: -allergic reactions like skin rash, itching or hives, swelling of the face, lips, or tongue -back pain -dizziness or feeling faint -fever -pain, redness, or irritation at site where injected -pinpoint red spots on the skin -shortness of breath or breathing problems -signs and symptoms of kidney injury like trouble passing urine, change in the amount of urine, or red or dark-brown urine -stomach   or side pain, or pain at the shoulder -swelling -tiredness -unusual bleeding or bruising Side effects that usually do not require medical attention (report to your doctor or health care professional if they continue or are  bothersome): -bone pain -cough -diarrhea -hair loss -headache -muscle pain This list may not describe all possible side effects. Call your doctor for medical advice about side effects. You may report side effects to FDA at 1-800-FDA-1088. Where should I keep my medicine? Keep out of the reach of children. Store in a refrigerator between 2 and 8 degrees C (36 and 46 degrees F). Do not freeze. Keep in carton to protect from light. Throw away this medicine if vials or syringes are left out of the refrigerator for more than 24 hours. Throw away any unused medicine after the expiration date. NOTE: This sheet is a summary. It may not cover all possible information. If you have questions about this medicine, talk to your doctor, pharmacist, or health care provider.  2019 Elsevier/Gold Standard (2018-05-01 15:39:45)  

## 2019-02-04 ENCOUNTER — Telehealth: Payer: Self-pay | Admitting: *Deleted

## 2019-02-04 ENCOUNTER — Other Ambulatory Visit: Payer: Self-pay

## 2019-02-04 ENCOUNTER — Inpatient Hospital Stay: Payer: No Typology Code available for payment source

## 2019-02-04 VITALS — BP 153/79 | HR 73 | Temp 98.2°F | Resp 18

## 2019-02-04 DIAGNOSIS — Z171 Estrogen receptor negative status [ER-]: Secondary | ICD-10-CM

## 2019-02-04 DIAGNOSIS — C50212 Malignant neoplasm of upper-inner quadrant of left female breast: Secondary | ICD-10-CM

## 2019-02-04 DIAGNOSIS — Z5111 Encounter for antineoplastic chemotherapy: Secondary | ICD-10-CM | POA: Diagnosis not present

## 2019-02-04 DIAGNOSIS — C50912 Malignant neoplasm of unspecified site of left female breast: Secondary | ICD-10-CM

## 2019-02-04 DIAGNOSIS — Z95828 Presence of other vascular implants and grafts: Secondary | ICD-10-CM

## 2019-02-04 LAB — COMPREHENSIVE METABOLIC PANEL
ALT: 118 U/L — ABNORMAL HIGH (ref 0–44)
AST: 78 U/L — ABNORMAL HIGH (ref 15–41)
Albumin: 4.5 g/dL (ref 3.5–5.0)
Alkaline Phosphatase: 96 U/L (ref 38–126)
Anion gap: 14 (ref 5–15)
BUN: 14 mg/dL (ref 6–20)
CO2: 24 mmol/L (ref 22–32)
Calcium: 9.8 mg/dL (ref 8.9–10.3)
Chloride: 101 mmol/L (ref 98–111)
Creatinine, Ser: 0.77 mg/dL (ref 0.44–1.00)
GFR calc Af Amer: >60 mL/min (ref >60)
GFR calc non Af Amer: >60 mL/min (ref >60)
Glucose, Bld: 100 mg/dL — ABNORMAL HIGH (ref 70–99)
Potassium: 3.5 mmol/L (ref 3.5–5.1)
Sodium: 139 mmol/L (ref 135–145)
Total Bilirubin: 0.3 mg/dL (ref 0.3–1.2)
Total Protein: 7.5 g/dL (ref 6.5–8.1)

## 2019-02-04 LAB — CBC WITH DIFFERENTIAL/PLATELET
Abs Immature Granulocytes: 2.26 10*3/uL — ABNORMAL HIGH (ref 0.00–0.07)
Basophils Absolute: 0.1 10*3/uL (ref 0.0–0.1)
Basophils Relative: 1 %
Eosinophils Absolute: 0 10*3/uL (ref 0.0–0.5)
Eosinophils Relative: 0 %
HCT: 28.1 % — ABNORMAL LOW (ref 36.0–46.0)
Hemoglobin: 9.2 g/dL — ABNORMAL LOW (ref 12.0–15.0)
Immature Granulocytes: 12 %
Lymphocytes Relative: 14 %
Lymphs Abs: 2.5 10*3/uL (ref 0.7–4.0)
MCH: 32.1 pg (ref 26.0–34.0)
MCHC: 32.7 g/dL (ref 30.0–36.0)
MCV: 97.9 fL (ref 80.0–100.0)
Monocytes Absolute: 1.6 10*3/uL — ABNORMAL HIGH (ref 0.1–1.0)
Monocytes Relative: 9 %
Neutro Abs: 11.7 10*3/uL — ABNORMAL HIGH (ref 1.7–7.7)
Neutrophils Relative %: 64 %
Platelets: 366 10*3/uL (ref 150–400)
RBC: 2.87 MIL/uL — ABNORMAL LOW (ref 3.87–5.11)
RDW: 17.9 % — ABNORMAL HIGH (ref 11.5–15.5)
WBC: 18.2 10*3/uL — ABNORMAL HIGH (ref 4.0–10.5)
nRBC: 2.7 % — ABNORMAL HIGH (ref 0.0–0.2)

## 2019-02-04 MED ORDER — HEPARIN SOD (PORK) LOCK FLUSH 100 UNIT/ML IV SOLN
500.0000 [IU] | Freq: Once | INTRAVENOUS | Status: AC | PRN
  Administered 2019-02-04: 13:00:00 500 [IU]
  Filled 2019-02-04: qty 5

## 2019-02-04 MED ORDER — SODIUM CHLORIDE 0.9 % IV SOLN
2000.0000 mg | Freq: Once | INTRAVENOUS | Status: AC
  Administered 2019-02-04: 12:00:00 2000 mg via INTRAVENOUS
  Filled 2019-02-04: qty 52.6

## 2019-02-04 MED ORDER — SODIUM CHLORIDE 0.9 % IV SOLN
Freq: Once | INTRAVENOUS | Status: AC
  Administered 2019-02-04: 10:00:00 via INTRAVENOUS
  Filled 2019-02-04: qty 250

## 2019-02-04 MED ORDER — SODIUM CHLORIDE 0.9 % IV SOLN
250.0000 mg | Freq: Once | INTRAVENOUS | Status: AC
  Administered 2019-02-04: 12:00:00 250 mg via INTRAVENOUS
  Filled 2019-02-04: qty 25

## 2019-02-04 MED ORDER — SODIUM CHLORIDE 0.9% FLUSH
10.0000 mL | INTRAVENOUS | Status: DC | PRN
  Administered 2019-02-04: 10 mL
  Filled 2019-02-04: qty 10

## 2019-02-04 MED ORDER — SODIUM CHLORIDE 0.9% FLUSH
10.0000 mL | INTRAVENOUS | Status: DC | PRN
  Administered 2019-02-04: 10 mL via INTRAVENOUS
  Filled 2019-02-04: qty 10

## 2019-02-04 MED ORDER — DEXAMETHASONE SODIUM PHOSPHATE 10 MG/ML IJ SOLN
10.0000 mg | Freq: Once | INTRAMUSCULAR | Status: AC
  Administered 2019-02-04: 11:00:00 10 mg via INTRAVENOUS

## 2019-02-04 MED ORDER — PALONOSETRON HCL INJECTION 0.25 MG/5ML
INTRAVENOUS | Status: AC
  Filled 2019-02-04: qty 5

## 2019-02-04 MED ORDER — DEXAMETHASONE SODIUM PHOSPHATE 10 MG/ML IJ SOLN
INTRAMUSCULAR | Status: AC
  Filled 2019-02-04: qty 1

## 2019-02-04 MED ORDER — PALONOSETRON HCL INJECTION 0.25 MG/5ML
0.2500 mg | Freq: Once | INTRAVENOUS | Status: AC
  Administered 2019-02-04: 11:00:00 0.25 mg via INTRAVENOUS

## 2019-02-04 NOTE — Telephone Encounter (Signed)
May proceed with treatment today with noted elevated AST of 78 and ALT of 118 per MD review.

## 2019-02-04 NOTE — Telephone Encounter (Signed)
Per MD - Beryle Flock on hold pending prior authorization - Caris study sent 02/01/2019 for PDL-1 - awaiting results.

## 2019-02-04 NOTE — Patient Instructions (Signed)

## 2019-02-04 NOTE — Patient Instructions (Signed)
Cancer Center Discharge Instructions for Patients Receiving Chemotherapy  Today you received the following chemotherapy agents Gemzar and Carboplatin   To help prevent nausea and vomiting after your treatment, we encourage you to take your nausea medication as directed.    If you develop nausea and vomiting that is not controlled by your nausea medication, call the clinic.   BELOW ARE SYMPTOMS THAT SHOULD BE REPORTED IMMEDIATELY:  *FEVER GREATER THAN 100.5 F  *CHILLS WITH OR WITHOUT FEVER  NAUSEA AND VOMITING THAT IS NOT CONTROLLED WITH YOUR NAUSEA MEDICATION  *UNUSUAL SHORTNESS OF BREATH  *UNUSUAL BRUISING OR BLEEDING  TENDERNESS IN MOUTH AND THROAT WITH OR WITHOUT PRESENCE OF ULCERS  *URINARY PROBLEMS  *BOWEL PROBLEMS  UNUSUAL RASH Items with * indicate a potential emergency and should be followed up as soon as possible.  Feel free to call the clinic should you have any questions or concerns. The clinic phone number is [phone].  Please show the CHEMO ALERT CARD at check-in to the Emergency Department and triage nurse.   

## 2019-02-08 ENCOUNTER — Inpatient Hospital Stay: Payer: No Typology Code available for payment source

## 2019-02-08 ENCOUNTER — Telehealth: Payer: Self-pay | Admitting: *Deleted

## 2019-02-08 ENCOUNTER — Other Ambulatory Visit: Payer: Self-pay

## 2019-02-08 VITALS — BP 129/80 | HR 94 | Temp 99.1°F | Resp 18

## 2019-02-08 DIAGNOSIS — Z5111 Encounter for antineoplastic chemotherapy: Secondary | ICD-10-CM | POA: Diagnosis not present

## 2019-02-08 DIAGNOSIS — Z95828 Presence of other vascular implants and grafts: Secondary | ICD-10-CM

## 2019-02-08 MED ORDER — FILGRASTIM-SNDZ 480 MCG/0.8ML IJ SOSY
480.0000 ug | PREFILLED_SYRINGE | Freq: Once | INTRAMUSCULAR | Status: AC
  Administered 2019-02-08: 480 ug via SUBCUTANEOUS
  Filled 2019-02-08: qty 0.8

## 2019-02-08 NOTE — Patient Instructions (Signed)
Filgrastim, G-CSF injection What is this medicine? FILGRASTIM, G-CSF (fil GRA stim) is a granulocyte colony-stimulating factor that stimulates the growth of neutrophils, a type of white blood cell (WBC) important in the body's fight against infection. It is used to reduce the incidence of fever and infection in patients with certain types of cancer who are receiving chemotherapy that affects the bone marrow, to stimulate blood cell production for removal of WBCs from the body prior to a bone marrow transplantation, to reduce the incidence of fever and infection in patients who have severe chronic neutropenia, and to improve survival outcomes following high-dose radiation exposure that is toxic to the bone marrow. This medicine may be used for other purposes; ask your health care provider or pharmacist if you have questions. COMMON BRAND NAME(S): Neupogen, Nivestym, Zarxio What should I tell my health care provider before I take this medicine? They need to know if you have any of these conditions: -kidney disease -latex allergy -ongoing radiation therapy -sickle cell disease -an unusual or allergic reaction to filgrastim, pegfilgrastim, other medicines, foods, dyes, or preservatives -pregnant or trying to get pregnant -breast-feeding How should I use this medicine? This medicine is for injection under the skin or infusion into a vein. As an infusion into a vein, it is usually given by a health care professional in a hospital or clinic setting. If you get this medicine at home, you will be taught how to prepare and give this medicine. Refer to the Instructions for Use that come with your medication packaging. Use exactly as directed. Take your medicine at regular intervals. Do not take your medicine more often than directed. It is important that you put your used needles and syringes in a special sharps container. Do not put them in a trash can. If you do not have a sharps container, call your  pharmacist or healthcare provider to get one. Talk to your pediatrician regarding the use of this medicine in children. While this drug may be prescribed for children as young as 7 months for selected conditions, precautions do apply. Overdosage: If you think you have taken too much of this medicine contact a poison control center or emergency room at once. NOTE: This medicine is only for you. Do not share this medicine with others. What if I miss a dose? It is important not to miss your dose. Call your doctor or health care professional if you miss a dose. What may interact with this medicine? This medicine may interact with the following medications: -medicines that may cause a release of neutrophils, such as lithium This list may not describe all possible interactions. Give your health care provider a list of all the medicines, herbs, non-prescription drugs, or dietary supplements you use. Also tell them if you smoke, drink alcohol, or use illegal drugs. Some items may interact with your medicine. What should I watch for while using this medicine? You may need blood work done while you are taking this medicine. What side effects may I notice from receiving this medicine? Side effects that you should report to your doctor or health care professional as soon as possible: -allergic reactions like skin rash, itching or hives, swelling of the face, lips, or tongue -back pain -dizziness or feeling faint -fever -pain, redness, or irritation at site where injected -pinpoint red spots on the skin -shortness of breath or breathing problems -signs and symptoms of kidney injury like trouble passing urine, change in the amount of urine, or red or dark-brown urine -stomach   or side pain, or pain at the shoulder -swelling -tiredness -unusual bleeding or bruising Side effects that usually do not require medical attention (report to your doctor or health care professional if they continue or are  bothersome): -bone pain -cough -diarrhea -hair loss -headache -muscle pain This list may not describe all possible side effects. Call your doctor for medical advice about side effects. You may report side effects to FDA at 1-800-FDA-1088. Where should I keep my medicine? Keep out of the reach of children. Store in a refrigerator between 2 and 8 degrees C (36 and 46 degrees F). Do not freeze. Keep in carton to protect from light. Throw away this medicine if vials or syringes are left out of the refrigerator for more than 24 hours. Throw away any unused medicine after the expiration date. NOTE: This sheet is a summary. It may not cover all possible information. If you have questions about this medicine, talk to your doctor, pharmacist, or health care provider.  2019 Elsevier/Gold Standard (2018-05-01 15:39:45)  

## 2019-02-09 DIAGNOSIS — Z923 Personal history of irradiation: Secondary | ICD-10-CM | POA: Insufficient documentation

## 2019-02-11 ENCOUNTER — Other Ambulatory Visit: Payer: Self-pay | Admitting: General Surgery

## 2019-02-11 DIAGNOSIS — C50912 Malignant neoplasm of unspecified site of left female breast: Secondary | ICD-10-CM

## 2019-02-16 NOTE — Progress Notes (Signed)
[facility]  [phone] [phone]    ID: [name] OB: 1961-03-19  MR#: [id number]  RJJ#:[id number]  Patient Care Team: [name], DO as PCP - General (Family Medicine) Josue Hector, MD as PCP - Cardiology (Cardiology) Magrinat, Virgie Dad, MD as Consulting Physician (Oncology) Brien Few, MD as Consulting Physician (Obstetrics and Gynecology) [name], MD as Consulting Physician (General Surgery) Delrae Rend, MD as Consulting Physician (Endocrinology) Wilford Corner, MD as Consulting Physician (Gastroenterology) Dorna Leitz, MD as Consulting Physician (Orthopedic Surgery) Josue Hector, MD as Consulting Physician (Cardiology) Irene Limbo, MD as Consulting Physician (Plastic Surgery) OTHER MD:    CHIEF COMPLAINT: Triple negative breast cancer, locally recurrent  CURRENT TREATMENT: Neoadjuvant chemotherapy, to add pembrolizumab   INTERVAL HISTORY: Alexandria Bradley was seen today for follow-up and treatment of her locally recurrent triple negative breast cancer.  She continues on neoadjuvant chemotherapy consisting of carboplatin and gemcitabine given days 1 and 8 of each 21-day cycle, for 6 cycles.  Today is day 1 cycle 5, dose #9 of 12 planned.  She continues to have some issues with her stomach. She also notes some vertigo symptoms, feeling "woozy," which she feels is getting worse. She reports some neuropathy in her toes when she is laying down.  Since her last visit, she has not undergone any additional studies.   REVIEW OF SYSTEMS: Alexandria Bradley reports doing well overall. She states she saw Dr. Iran Planas last week to discuss reconstruction. Latissimus flap with implant was recommended.  The patient uses her treadmill for 10 minutes; she tries to do this every day but isn't always able. She states her back pain will stop her. The patient denies unusual headaches, visual changes, nausea, vomiting, stiff  neck, or gait imbalance. There has been no cough, phlegm production, or pleurisy, no chest pain or pressure, and no change in bowel or bladder habits. The patient denies fever, rash, bleeding, unexplained fatigue or unexplained weight loss. A detailed review of systems was otherwise entirely negative.   BREAST CANCER HISTORY: From the original intake note of 02/16/2014:  [name] palpated a mass in her left breast early May and brought it immediately to her gynecologist attention. He set her up for bilateral diagnostic mammography and left ultrasonography at [facility] 02/07/2014. Mammography showed a 1.3 cm oval mass with spiculated margins in the left breast at the 11:00 position. This was palpable. Ultrasound showed a 1.1 cm tall her than wide mass with a microlobulated margins. He was hypoechoic. There were no abnormalities noted in the left axilla.  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 Lyndon Code at conference 02/16/2014), triple negative, with an MIB-1 of 50%.  The patient's subsequent history is as detailed below   PAST MEDICAL HISTORY: Past Medical History:  Diagnosis Date  . Anxiety   . Arthritis   . Back pain   . Breast cancer ([place])    left  . Dyspnea   . Fatty liver   . GERD (gastroesophageal reflux disease)   . Headache(784.0)    PMH : migraines  . History of kidney stones   . Hypercholesterolemia   . Hypertension   . Hypothyroidism   . Kidney stones   . Osteoporosis   . Personal history of chemotherapy   . Personal history of radiation therapy   . PONV (postoperative nausea and vomiting)    difficult to wake up  .  Pre-diabetes   . Psoriasis   . PVC's (premature ventricular contractions)   . Radiation 08/18/14-10/07/14   Left breast    PAST SURGICAL HISTORY: Past Surgical History:  Procedure Laterality Date  . BREAST BIOPSY Left 11/10/2018  .  BREAST LUMPECTOMY Left 2015  . BREAST LUMPECTOMY WITH AXILLARY LYMPH NODE BIOPSY  6/15   left  . CARDIAC CATHETERIZATION    . CHOLECYSTECTOMY    . COLONOSCOPY N/A 06/27/2014   Procedure: COLONOSCOPY;  Surgeon: Lear Ng, MD;  Location: [facility] ENDOSCOPY;  Service: Endoscopy;  Laterality: N/A;  . LEFT HEART CATH AND CORONARY ANGIOGRAPHY N/A 07/24/2018   Procedure: LEFT HEART CATH AND CORONARY ANGIOGRAPHY;  Surgeon: Troy Sine, MD;  Location: [facility] CV LAB;  Service: Cardiovascular;  Laterality: N/A;  . LIPOMA EXCISION    . MYOMECTOMY    . PORT-A-CATH REMOVAL N/A 02/03/2015   Procedure: REMOVAL PORT-A-CATH;  Surgeon: [name], MD;  Location: [facility];  Service: General;  Laterality: N/A;  . PORTACATH PLACEMENT N/A 03/01/2014   Procedure: INSERTION PORT-A-CATH;  Surgeon: [name], MD;  Location: [facility];  Service: General;  Laterality: N/A;  . PORTACATH PLACEMENT N/A 11/18/2018   Procedure: INSERTION PORT-A-CATH WITH ULTRASOUND;  Surgeon: [name], MD;  Location: [facility];  Service: General;  Laterality: N/A;  . TONSILLECTOMY    . TUBAL LIGATION      FAMILY HISTORY: Family History  Problem Relation Age of Onset  . Endometrial cancer Mother 36  . Hearing loss Mother   . Atrial fibrillation Mother   . Lung cancer Father   . Brain cancer Father   . Heart disease Maternal Aunt   . Atrial fibrillation Maternal Aunt   . Lung cancer Maternal Uncle   . Atrial fibrillation Maternal Uncle   . Stroke Maternal Grandmother   . Stroke Maternal Grandfather   . Bladder Cancer Maternal Uncle   . Multiple myeloma Maternal Uncle   . Other Son        trisomy 32   The patient's father died at the age of 35 from what may have been metastatic lung cancer. The patient knows very little about the father's side of her family. Her mother is alive at age 42. She was recently diagnosed with endometrial cancer. The patient has one brother, 2  sisters. There is no history of breast or ovarian cancer in the family.   GYNECOLOGIC HISTORY:  Menarche age 1, first live birth age 38. The patient is GX P3. One child died shortly after birth. She stopped having periods approximately 2005. She did not use hormone replacement. 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 Son [name] is  Nurse, adult in [place]. Daughter [name] lives in [place] and works for [employer] as an Therapist, sports. She has three grandchildren.    ADVANCED DIRECTIVES: Not in place   HEALTH MAINTENANCE: Social History   Tobacco Use  . Smoking status: Former Smoker    Packs/day: 0.50    Years: 15.00    Pack years: 7.50    Types: Cigarettes  . Smokeless tobacco: Never Used  . Tobacco comment: Quit smoking cigarettes in 2002  Substance Use Topics  . Alcohol use: Yes    Comment: social  .  Drug use: No     Colonoscopy: 06/27/2014, normal, [name]  PAP: 2013  Bone density: 04/10/2015, -1.1 ([facility])  Lipid panel:  Allergies  Allergen Reactions  . Chlorhexidine Itching  . Ciprofloxacin Anxiety, Rash and Other (See Comments)    GI upset  . Prednisone Shortness Of Breath and Other (See Comments)    "jacks me up" jittery   . Codeine Nausea Only  . Cortisone Swelling and Other (See Comments)    "jack me up"     Current Outpatient Medications  Medication Sig Dispense Refill  . acetaminophen (TYLENOL) 500 MG tablet Take 1,000 mg by mouth at bedtime as needed for headache.     . betamethasone dipropionate (DIPROLENE) 0.05 % cream Apply 1 application topically daily as needed (psoriasis).    . bismuth subsalicylate (PEPTO BISMOL) 262 MG chewable tablet Chew 524 mg by mouth as needed  for indigestion.    Marland Kitchen diltiazem (CARDIZEM CD) 240 MG 24 hr capsule Take 1 capsule (240 mg total) by mouth daily. 90 capsule 3  . fluticasone (FLONASE) 50 MCG/ACT nasal spray SPRAY 2 SPRAYS INTO EACH NOSTRIL EVERY DAY (Patient taking differently: Place 1 spray into both nostrils daily as needed for allergies. ) 48 g 1  . lidocaine-prilocaine (EMLA) cream Apply to affected area once 30 g 3  . liothyronine (CYTOMEL) 5 MCG tablet Take 5 mcg by mouth daily before breakfast.     . metFORMIN (GLUCOPHAGE-XR) 500 MG 24 hr tablet Take 1,000 mg by mouth every evening.    Marland Kitchen omeprazole (PRILOSEC) 20 MG capsule Take 1 capsule (20 mg total) by mouth 2 (two) times daily before a meal. 60 capsule 6  . ondansetron (ZOFRAN) 8 MG tablet Take 1 tablet (8 mg total) by mouth every 8 (eight) hours as needed for nausea or vomiting. 20 tablet 0  . Phenylephrine-Acetaminophen (TYLENOL SINUS+HEADACHE PO) Take 1 tablet by mouth daily as needed (sinus headaches).    . potassium chloride (K-DUR) 10 MEQ tablet Take 1 tablet (10 mEq total) by mouth 2 (two) times daily. 80 tablet 2  . prochlorperazine (COMPAZINE) 10 MG tablet Take 1 tablet (10 mg total) by mouth every 6 (six) hours as needed (Nausea or vomiting). 30 tablet 1  . propranolol (INDERAL) 10 MG tablet Take 10 mg by mouth daily as needed (heart palpitations).     . TIROSINT 100 MCG CAPS Take 100 mcg by mouth daily before breakfast.   4  . triamterene-hydrochlorothiazide (MAXZIDE-25) 37.5-25 MG tablet Take 1 tablet by mouth daily. 90 tablet 3   No current facility-administered medications for this visit.     OBJECTIVE: Middle-aged white woman in no acute distress  Vitals:   02/18/19 0826  BP: 126/84  Pulse: (!) 106  Resp: 18  Temp: 98.4 F (36.9 C)  SpO2: 97%     Body mass index is 33.4 kg/m.    ECOG FS:1 - Symptomatic but completely ambulatory  Sclerae unicteric, EOMs intact Oropharynx clear and moist No cervical or supraclavicular adenopathy Lungs no  rales or rhonchi Heart regular rate and rhythm Abd soft, nontender, positive bowel sounds MSK no focal spinal tenderness, no upper extremity lymphedema Neuro: nonfocal, well oriented, appropriate affect Breasts: Deferred   LAB RESULTS: No results found for: SPEP  Lab Results  Component Value Date   WBC 6.6 02/18/2019   NEUTROABS 4.1 02/18/2019   HGB 9.1 (L) 02/18/2019   HCT 28.7 (L) 02/18/2019   MCV 104.4 (H) 02/18/2019   PLT 352 02/18/2019  Chemistry      Component Value Date/Time   NA 140 02/18/2019 0820   NA 140 06/19/2017 1312   K 2.9 (LL) 02/18/2019 0820   K 3.5 06/19/2017 1312   CL 102 02/18/2019 0820   CO2 24 02/18/2019 0820   CO2 25 06/19/2017 1312   BUN 11 02/18/2019 0820   BUN 18.4 06/19/2017 1312   CREATININE 0.83 02/18/2019 0820   CREATININE 0.8 06/19/2017 1312      Component Value Date/Time   CALCIUM 9.4 02/18/2019 0820   CALCIUM 10.2 06/19/2017 1312   ALKPHOS 73 02/18/2019 0820   ALKPHOS 86 06/19/2017 1312   AST 37 02/18/2019 0820   AST 15 06/19/2017 1312   ALT 53 (H) 02/18/2019 0820   ALT 22 06/19/2017 1312   BILITOT 0.4 02/18/2019 0820   BILITOT 0.26 06/19/2017 1312       No results found for: LABCA2  No components found for: LABCA125  No results for input(s): INR in the last 168 hours.  Urinalysis    Component Value Date/Time   COLORURINE YELLOW 03/13/2009 2251   APPEARANCEUR CLOUDY (A) 03/13/2009 2251   LABSPEC 1.020 06/15/2014 1101   PHURINE 6.0 06/15/2014 1101   PHURINE 5.5 03/13/2009 2251   GLUCOSEU Negative 06/15/2014 1101   HGBUR Negative 06/15/2014 1101   HGBUR NEGATIVE 03/13/2009 2251   BILIRUBINUR Color Interference 06/15/2014 1101   KETONESUR Negative 06/15/2014 1101   KETONESUR NEGATIVE 03/13/2009 2251   PROTEINUR 30 06/15/2014 1101   PROTEINUR NEGATIVE 03/13/2009 2251   UROBILINOGEN 0.2 06/15/2014 1101   NITRITE Negative 06/15/2014 1101   NITRITE NEGATIVE 03/13/2009 2251   LEUKOCYTESUR Trace 06/15/2014 1101     STUDIES: US Breast Ltd Uni Left Inc Axilla  Result Date: 01/25/2019 CLINICAL DATA:  Patient with history of left-sided breast cancer. Evaluate for interval change in size on neoadjuvant chemotherapy. EXAM: ULTRASOUND OF THE LEFT BREAST COMPARISON:  Previous exam(s). FINDINGS: Targeted ultrasound is performed, showing a 1.1 x 1.2 x 1.3 cm irregular hypoechoic mass compatible with biopsy-proven malignancy left breast 10 o'clock position 7 cm from nipple. This mass is similar in size when compared to prior exam where it measured 1.4 x 1.2 x 1.3 cm. IMPRESSION: Grossly similar size of left breast malignancy when compared on ultrasound. RECOMMENDATION: Continue with treatment plan for known left breast malignancy. I have discussed the findings and recommendations with the patient. Results were also provided in writing at the conclusion of the visit. If applicable, a reminder letter will be sent to the patient regarding the next appointment. BI-RADS CATEGORY  6: Known biopsy-proven malignancy. Electronically Signed   By: Lovey Newcomer M.D.   On: 01/25/2019 07:59    ASSESSMENT: 58 y.o. [place] woman status post left breast upper inner quadrant biopsy 02/08/2014 for a clinical T1c N0, stage IA invasive ductal carcinoma, grade 3, triple negative, with an MIB-1 of 50%.  (1) status post left lumpectomy and sentinel lymph node sampling 03/01/2014 for a pT1c pN0, stage IA invasive ductal carcinoma, grade 3, with negative margins, repeat prognostic panel again triple negative.  (2) adjuvant chemotherapy started a 03/25/2014 consisting of cyclophosphamide and doxorubicin given in dose dense fashion x4, completed 05/06/2014; followed by weekly paclitaxel x1, on 05/27/2014, poorly tolerated;   (a) switched to Abraxane starting 06/10/2014, with 11 Abraxane doses planned  (b) dose reduced by 20% because of neutropenia starting 07/01/2014 dose  (c) discontinued after 4th Abraxane dose 07/15/2014 due to neuropathy   (3) adjuvant radiation completed 10/07/2014.  Left breast with breath hold technique/ 45 Gy at 1.8 Gy per fraction x 25 fractions.   Left breast boost/ 16 Gy at 2 Gy per fraction x 8 fractions  (4) genetics counseling 07/06/2015 through the Breast/Ovarian cancer gene panel offered by [facility] found no deleterious mutations in ATM, BARD1, BRCA1, BRCA2, BRIP1, CDH1, CHEK2, EPCAM, FANCC, MLH1, MSH2, MSH6, NBN, PALB2, PMS2, PTEN, RAD51C, RAD51D, TP53, and XRCC2.  (5) question of sleep apnea  (6) transverse colon lesion noted on CT scan from 06/17/2014 - negative biopsy, also showed fatty liver  (a) repeat CT scan of the abdomen and pelvis with contrast 02/15/2016 negative  RECURRENT DISEASE: February 2020 (7) biopsy of palpable mass upper inner left breast 11/10/2018 confirms invasive ductal carcinoma, grade 2 or 3, triple negative, with an MIB-1 of 80%.  (a) CT scans of the chest abdomen and pelvis with contrast showed no stage IV disease  (b) bone scan 11/13/2018 shows no definite metastasis to bone  (8) neoadjuvant chemotherapy will consist of carboplatin and gemcitabine given days 1 and 8 of each 21-day cycle, for 6 cycles, started 11/26/2018  (a) repeat ultrasound 01/25/2019 (after 3 cycles) shows no change in measurable breast mass  (b) pembrolizumab to be added beginning 02/18/2019  (c) repeat ultrasound preop to assess pembrolizumab response  (9) bilateral mastectomies with left sentinel lymph node sampling and immediate reconstruction to follow   PLAN: Ambreen is doing moderately well with chemotherapy.  She is proceeding to her ninth of 12 planned treatments today.  She is already looking forward to definitive surgery and is planning her reconstruction.  Hopefully we can get her pembrolizumab started today.  The plan would be to continue this at least for 6 months preferably for 12.  We discussed her hypokalemia.  She will receive some potassium here today and I have written for  her to receive potassium at home 20 mEq daily.  She will see me again in 1 week.  She knows to call for any issues that may develop before then.     Magrinat, Virgie Dad, MD  02/18/19 9:08 AM Medical Oncology and Hematology [facility] [address] [place], [place] [address] Tel. [phone]    Fax. [phone]   I, Wilburn Mylar, am acting as scribe for Dr. Virgie Dad. Magrinat.  I, Lurline Del MD, have reviewed the above documentation for accuracy and completeness, and I agree with the above.

## 2019-02-18 ENCOUNTER — Inpatient Hospital Stay (HOSPITAL_BASED_OUTPATIENT_CLINIC_OR_DEPARTMENT_OTHER): Payer: No Typology Code available for payment source | Admitting: Oncology

## 2019-02-18 ENCOUNTER — Other Ambulatory Visit: Payer: Self-pay

## 2019-02-18 ENCOUNTER — Inpatient Hospital Stay: Payer: No Typology Code available for payment source

## 2019-02-18 VITALS — BP 126/84 | HR 106 | Temp 98.4°F | Resp 18 | Ht 65.0 in | Wt 200.7 lb

## 2019-02-18 VITALS — HR 99

## 2019-02-18 DIAGNOSIS — E876 Hypokalemia: Secondary | ICD-10-CM

## 2019-02-18 DIAGNOSIS — C50212 Malignant neoplasm of upper-inner quadrant of left female breast: Secondary | ICD-10-CM

## 2019-02-18 DIAGNOSIS — D709 Neutropenia, unspecified: Secondary | ICD-10-CM | POA: Diagnosis not present

## 2019-02-18 DIAGNOSIS — Z95828 Presence of other vascular implants and grafts: Secondary | ICD-10-CM

## 2019-02-18 DIAGNOSIS — C50912 Malignant neoplasm of unspecified site of left female breast: Secondary | ICD-10-CM

## 2019-02-18 DIAGNOSIS — Z5111 Encounter for antineoplastic chemotherapy: Secondary | ICD-10-CM | POA: Diagnosis not present

## 2019-02-18 DIAGNOSIS — Z9013 Acquired absence of bilateral breasts and nipples: Secondary | ICD-10-CM

## 2019-02-18 DIAGNOSIS — Z171 Estrogen receptor negative status [ER-]: Secondary | ICD-10-CM

## 2019-02-18 DIAGNOSIS — Z923 Personal history of irradiation: Secondary | ICD-10-CM

## 2019-02-18 DIAGNOSIS — Z9221 Personal history of antineoplastic chemotherapy: Secondary | ICD-10-CM

## 2019-02-18 LAB — CBC WITH DIFFERENTIAL/PLATELET
Abs Immature Granulocytes: 0.22 10*3/uL — ABNORMAL HIGH (ref 0.00–0.07)
Basophils Absolute: 0 10*3/uL (ref 0.0–0.1)
Basophils Relative: 0 %
Eosinophils Absolute: 0.1 10*3/uL (ref 0.0–0.5)
Eosinophils Relative: 2 %
HCT: 28.7 % — ABNORMAL LOW (ref 36.0–46.0)
Hemoglobin: 9.1 g/dL — ABNORMAL LOW (ref 12.0–15.0)
Immature Granulocytes: 3 %
Lymphocytes Relative: 19 %
Lymphs Abs: 1.2 10*3/uL (ref 0.7–4.0)
MCH: 33.1 pg (ref 26.0–34.0)
MCHC: 31.7 g/dL (ref 30.0–36.0)
MCV: 104.4 fL — ABNORMAL HIGH (ref 80.0–100.0)
Monocytes Absolute: 0.9 10*3/uL (ref 0.1–1.0)
Monocytes Relative: 14 %
Neutro Abs: 4.1 10*3/uL (ref 1.7–7.7)
Neutrophils Relative %: 62 %
Platelets: 352 10*3/uL (ref 150–400)
RBC: 2.75 MIL/uL — ABNORMAL LOW (ref 3.87–5.11)
RDW: 22.9 % — ABNORMAL HIGH (ref 11.5–15.5)
WBC: 6.6 10*3/uL (ref 4.0–10.5)
nRBC: 0.9 % — ABNORMAL HIGH (ref 0.0–0.2)

## 2019-02-18 LAB — COMPREHENSIVE METABOLIC PANEL
ALT: 53 U/L — ABNORMAL HIGH (ref 0–44)
AST: 37 U/L (ref 15–41)
Albumin: 4.3 g/dL (ref 3.5–5.0)
Alkaline Phosphatase: 73 U/L (ref 38–126)
Anion gap: 14 (ref 5–15)
BUN: 11 mg/dL (ref 6–20)
CO2: 24 mmol/L (ref 22–32)
Calcium: 9.4 mg/dL (ref 8.9–10.3)
Chloride: 102 mmol/L (ref 98–111)
Creatinine, Ser: 0.83 mg/dL (ref 0.44–1.00)
GFR calc Af Amer: >60 mL/min (ref >60)
GFR calc non Af Amer: >60 mL/min (ref >60)
Glucose, Bld: 159 mg/dL — ABNORMAL HIGH (ref 70–99)
Potassium: 2.9 mmol/L — CL (ref 3.5–5.1)
Sodium: 140 mmol/L (ref 135–145)
Total Bilirubin: 0.4 mg/dL (ref 0.3–1.2)
Total Protein: 7.3 g/dL (ref 6.5–8.1)

## 2019-02-18 MED ORDER — DEXAMETHASONE SODIUM PHOSPHATE 10 MG/ML IJ SOLN
10.0000 mg | Freq: Once | INTRAMUSCULAR | Status: AC
  Administered 2019-02-18: 10:00:00 10 mg via INTRAVENOUS

## 2019-02-18 MED ORDER — DEXAMETHASONE SODIUM PHOSPHATE 10 MG/ML IJ SOLN
INTRAMUSCULAR | Status: AC
  Filled 2019-02-18: qty 1

## 2019-02-18 MED ORDER — SODIUM CHLORIDE 0.9 % IV SOLN
Freq: Once | INTRAVENOUS | Status: AC
  Administered 2019-02-18: 10:00:00 via INTRAVENOUS
  Filled 2019-02-18: qty 250

## 2019-02-18 MED ORDER — POTASSIUM CHLORIDE ER 10 MEQ PO TBCR: 10.0000 meq | EXTENDED_RELEASE_TABLET | Freq: Two times a day (BID) | ORAL | 2 refills | Status: DC

## 2019-02-18 MED ORDER — SODIUM CHLORIDE 0.9 % IV SOLN
250.0000 mg | Freq: Once | INTRAVENOUS | Status: AC
  Administered 2019-02-18: 11:00:00 250 mg via INTRAVENOUS
  Filled 2019-02-18: qty 25

## 2019-02-18 MED ORDER — HEPARIN SOD (PORK) LOCK FLUSH 100 UNIT/ML IV SOLN
500.0000 [IU] | Freq: Once | INTRAVENOUS | Status: AC | PRN
  Administered 2019-02-18: 500 [IU]
  Filled 2019-02-18: qty 5

## 2019-02-18 MED ORDER — SODIUM CHLORIDE 0.9% FLUSH
10.0000 mL | INTRAVENOUS | Status: DC | PRN
  Administered 2019-02-18: 10 mL
  Filled 2019-02-18: qty 10

## 2019-02-18 MED ORDER — SODIUM CHLORIDE 0.9 % IV SOLN
Freq: Once | INTRAVENOUS | Status: AC
  Administered 2019-02-18: 09:00:00 via INTRAVENOUS
  Filled 2019-02-18: qty 250

## 2019-02-18 MED ORDER — PALONOSETRON HCL INJECTION 0.25 MG/5ML
0.2500 mg | Freq: Once | INTRAVENOUS | Status: AC
  Administered 2019-02-18: 10:00:00 0.25 mg via INTRAVENOUS

## 2019-02-18 MED ORDER — SODIUM CHLORIDE 0.9 % IV SOLN
2000.0000 mg | Freq: Once | INTRAVENOUS | Status: AC
  Administered 2019-02-18: 10:00:00 2000 mg via INTRAVENOUS
  Filled 2019-02-18: qty 52.6

## 2019-02-18 MED ORDER — SODIUM CHLORIDE 0.9% FLUSH
10.0000 mL | INTRAVENOUS | Status: DC | PRN
  Administered 2019-02-18: 10 mL via INTRAVENOUS
  Filled 2019-02-18: qty 10

## 2019-02-18 MED ORDER — SODIUM CHLORIDE 0.9 % IV SOLN: 2000.0000 mg | Freq: Once | INTRAVENOUS | Status: DC

## 2019-02-18 MED ORDER — POTASSIUM CHLORIDE 10 MEQ/100ML IV SOLN
10.0000 meq | INTRAVENOUS | Status: AC
  Administered 2019-02-18: 10:00:00 10 meq via INTRAVENOUS
  Filled 2019-02-18: qty 100

## 2019-02-18 MED ORDER — PALONOSETRON HCL INJECTION 0.25 MG/5ML
INTRAVENOUS | Status: AC
  Filled 2019-02-18: qty 5

## 2019-02-18 NOTE — Patient Instructions (Signed)

## 2019-02-18 NOTE — Patient Instructions (Signed)
[facility] Discharge Instructions for Patients Receiving Chemotherapy  Today you received the following chemotherapy agents Gemcitabine (GEMZAR) & Carboplatin (PARAPLATIN).  To help prevent nausea and vomiting after your treatment, we encourage you to take your nausea medication as prescribed.  If you develop nausea and vomiting that is not controlled by your nausea medication, call the clinic.   BELOW ARE SYMPTOMS THAT SHOULD BE REPORTED IMMEDIATELY:  *FEVER GREATER THAN 100.5 F  *CHILLS WITH OR WITHOUT FEVER  NAUSEA AND VOMITING THAT IS NOT CONTROLLED WITH YOUR NAUSEA MEDICATION  *UNUSUAL SHORTNESS OF BREATH  *UNUSUAL BRUISING OR BLEEDING  TENDERNESS IN MOUTH AND THROAT WITH OR WITHOUT PRESENCE OF ULCERS  *URINARY PROBLEMS  *BOWEL PROBLEMS  UNUSUAL RASH Items with * indicate a potential emergency and should be followed up as soon as possible.  Feel free to call the clinic should you have any questions or concerns. The clinic phone number is [phone].  Please show the [facility] at check-in to the Emergency Department and triage nurse.  Potassium chloride injection What is this medicine? POTASSIUM CHLORIDE (poe TASS i um KLOOR ide) is a potassium supplement used to prevent and to treat low potassium. Potassium is important for the heart, muscles, and nerves. Too much or too little potassium in the body can cause serious problems. This medicine may be used for other purposes; ask your health care provider or pharmacist if you have questions. COMMON BRAND NAME(S): PROAMP What should I tell my health care provider before I take this medicine? They need to know if you have any of these conditions: -Addison's disease -dehydration -diabetes -heart disease -high levels of potassium in the blood -irregular heartbeat -kidney disease -recent severe burn -an unusual or allergic reaction to potassium, other medicines, foods, dyes, or  preservatives -pregnant or trying to get pregnant -breast-feeding How should I use this medicine? This medicine is for infusion into a vein. It is given by a health care professional in a hospital or clinic setting. Talk to your pediatrician regarding the use of this medicine in children. Special care may be needed. Overdosage: If you think you have taken too much of this medicine contact a poison control center or emergency room at once. NOTE: This medicine is only for you. Do not share this medicine with others. What if I miss a dose? This does not apply. What may interact with this medicine? Do not take this medicine with any of the following medications: -certain diuretics such as spironolactone, triamterene -eplerenone -sodium polystyrene sulfonate This medicine may also interact with the following medications: -certain medicines for blood pressure or heart disease like lisinopril, losartan, quinapril, valsartan -medicines that lower your chance of fighting infection such as cyclosporine, tacrolimus -NSAIDs, medicines for pain and inflammation, like ibuprofen or naproxen -other potassium supplements -salt substitutes This list may not describe all possible interactions. Give your health care provider a list of all the medicines, herbs, non-prescription drugs, or dietary supplements you use. Also tell them if you smoke, drink alcohol, or use illegal drugs. Some items may interact with your medicine. What should I watch for while using this medicine? Your condition will be monitored carefully while you are receiving this medicine. You may need blood work done while you are taking this medicine. What side effects may I notice from receiving this medicine? Side effects that you should report to your doctor or health care professional as soon as possible: -allergic reactions like skin rash, itching or hives, swelling of  the face, lips, or tongue -breathing problems -confusion -fast,  irregular heartbeat -feeling faint or lightheaded, falls -low blood pressure -numbness or tingling in hands or feet -pain, redness, or irritation at site where injected -unusually weak or tired -weakness, heaviness of legs Side effects that usually do not require medical attention (report to your doctor or health care professional if they continue or are bothersome): -diarrhea -nausea, vomiting -stomach pain This list may not describe all possible side effects. Call your doctor for medical advice about side effects. You may report side effects to FDA at 1-800-FDA-1088. Where should I keep my medicine? This drug is given in a hospital or clinic and will not be stored at home. NOTE: This sheet is a summary. It may not cover all possible information. If you have questions about this medicine, talk to your doctor, pharmacist, or health care provider.  2019 Elsevier/Gold Standard (2016-06-19 13:59:20)  Coronavirus (COVID-19) Are you at risk?  Are you at risk for the Coronavirus (COVID-19)?  To be considered HIGH RISK for Coronavirus (COVID-19), you have to meet the following criteria:  . Traveled to [place], [place], [place], [place] or [place]; or in the [place] to [place], [place], [place], or [place]; and have fever, cough, and shortness of breath within the last 2 weeks of travel OR . Been in close contact with a person diagnosed with COVID-19 within the last 2 weeks and have fever, cough, and shortness of breath . IF YOU DO NOT MEET THESE CRITERIA, YOU ARE CONSIDERED LOW RISK FOR COVID-19.  What to do if you are HIGH RISK for COVID-19?  Marland Kitchen If you are having a medical emergency, call 911. . Seek medical care right away. Before you go to a doctor's office, urgent care or emergency department, call ahead and tell them about your recent travel, contact with someone diagnosed with COVID-19, and your symptoms. You should receive instructions from your physician's office regarding  next steps of care.  . When you arrive at healthcare provider, tell the healthcare staff immediately you have returned from visiting [place], [place], [place], [place] or [place]; or traveled in the [place] to [place], [place], [place], or [place]; in the last two weeks or you have been in close contact with a person diagnosed with COVID-19 in the last 2 weeks.   . Tell the health care staff about your symptoms: fever, cough and shortness of breath. . After you have been seen by a medical provider, you will be either: o Tested for (COVID-19) and discharged home on quarantine except to seek medical care if symptoms worsen, and asked to  - Stay home and avoid contact with others until you get your results (4-5 days)  - Avoid travel on public transportation if possible (such as bus, train, or airplane) or o Sent to the Emergency Department by EMS for evaluation, COVID-19 testing, and possible admission depending on your condition and test results.  What to do if you are LOW RISK for COVID-19?  Reduce your risk of any infection by using the same precautions used for avoiding the common cold or flu:  Marland Kitchen Wash your hands often with soap and warm water for at least 20 seconds.  If soap and water are not readily available, use an alcohol-based hand sanitizer with at least 60% alcohol.  . If coughing or sneezing, cover your mouth and nose by coughing or sneezing into the elbow areas of your shirt or coat, into a tissue  or into your sleeve (not your hands). . Avoid shaking hands with others and consider head nods or verbal greetings only. . Avoid touching your eyes, nose, or mouth with unwashed hands.  . Avoid close contact with people who are sick. . Avoid places or events with large numbers of people in one location, like concerts or sporting events. . Carefully consider travel plans you have or are making. . If you are planning any travel outside or inside the [place], visit the CDC's Travelers'  Health webpage for the latest health notices. . If you have some symptoms but not all symptoms, continue to monitor at home and seek medical attention if your symptoms worsen. . If you are having a medical emergency, call 911.   [facility] / e-Visit: [email or web address]         [facility]: [facility]: [phone]                   [facility]: [phone]

## 2019-02-23 ENCOUNTER — Encounter: Payer: Self-pay | Admitting: *Deleted

## 2019-02-23 ENCOUNTER — Other Ambulatory Visit: Payer: Self-pay | Admitting: Oncology

## 2019-02-23 NOTE — Progress Notes (Signed)
I discussed the insurance situation regarding pembrolizumab with [name] the patient's husband.  He understands the insurance has been waiting decide whether or not to improve on the PDL 1 results which were only available today.  He tells me he was told by D and our billing service that insurance had denied this and that we were working with the pharmaceutical company to get the medication directly, but that is not the case  He understands that if we do not get approval from [employer] we will apply directly and that it may be a few weeks before we get definitive results.

## 2019-02-25 ENCOUNTER — Encounter: Payer: Self-pay | Admitting: Adult Health

## 2019-02-25 ENCOUNTER — Other Ambulatory Visit: Payer: Self-pay

## 2019-02-25 ENCOUNTER — Inpatient Hospital Stay: Payer: No Typology Code available for payment source

## 2019-02-25 ENCOUNTER — Inpatient Hospital Stay (HOSPITAL_BASED_OUTPATIENT_CLINIC_OR_DEPARTMENT_OTHER): Payer: No Typology Code available for payment source | Admitting: Adult Health

## 2019-02-25 VITALS — BP 132/59 | HR 91 | Temp 98.9°F | Resp 18 | Ht 65.0 in | Wt 201.8 lb

## 2019-02-25 DIAGNOSIS — Z95828 Presence of other vascular implants and grafts: Secondary | ICD-10-CM

## 2019-02-25 DIAGNOSIS — Z171 Estrogen receptor negative status [ER-]: Secondary | ICD-10-CM

## 2019-02-25 DIAGNOSIS — C50212 Malignant neoplasm of upper-inner quadrant of left female breast: Secondary | ICD-10-CM

## 2019-02-25 DIAGNOSIS — Z9013 Acquired absence of bilateral breasts and nipples: Secondary | ICD-10-CM

## 2019-02-25 DIAGNOSIS — Z9221 Personal history of antineoplastic chemotherapy: Secondary | ICD-10-CM

## 2019-02-25 DIAGNOSIS — E876 Hypokalemia: Secondary | ICD-10-CM

## 2019-02-25 DIAGNOSIS — D709 Neutropenia, unspecified: Secondary | ICD-10-CM | POA: Diagnosis not present

## 2019-02-25 DIAGNOSIS — C50912 Malignant neoplasm of unspecified site of left female breast: Secondary | ICD-10-CM

## 2019-02-25 DIAGNOSIS — Z923 Personal history of irradiation: Secondary | ICD-10-CM

## 2019-02-25 DIAGNOSIS — Z5111 Encounter for antineoplastic chemotherapy: Secondary | ICD-10-CM | POA: Diagnosis not present

## 2019-02-25 LAB — COMPREHENSIVE METABOLIC PANEL
ALT: 80 U/L — ABNORMAL HIGH (ref 0–44)
AST: 62 U/L — ABNORMAL HIGH (ref 15–41)
Albumin: 4.2 g/dL (ref 3.5–5.0)
Alkaline Phosphatase: 71 U/L (ref 38–126)
Anion gap: 13 (ref 5–15)
BUN: 12 mg/dL (ref 6–20)
CO2: 25 mmol/L (ref 22–32)
Calcium: 9.7 mg/dL (ref 8.9–10.3)
Chloride: 102 mmol/L (ref 98–111)
Creatinine, Ser: 0.76 mg/dL (ref 0.44–1.00)
GFR calc Af Amer: >60 mL/min (ref >60)
GFR calc non Af Amer: >60 mL/min (ref >60)
Glucose, Bld: 117 mg/dL — ABNORMAL HIGH (ref 70–99)
Potassium: 3.2 mmol/L — ABNORMAL LOW (ref 3.5–5.1)
Sodium: 140 mmol/L (ref 135–145)
Total Bilirubin: 0.4 mg/dL (ref 0.3–1.2)
Total Protein: 7.1 g/dL (ref 6.5–8.1)

## 2019-02-25 LAB — CBC WITH DIFFERENTIAL/PLATELET
Abs Immature Granulocytes: 0.62 10*3/uL — ABNORMAL HIGH (ref 0.00–0.07)
Basophils Absolute: 0.1 10*3/uL (ref 0.0–0.1)
Basophils Relative: 2 %
Eosinophils Absolute: 0 10*3/uL (ref 0.0–0.5)
Eosinophils Relative: 1 %
HCT: 27.2 % — ABNORMAL LOW (ref 36.0–46.0)
Hemoglobin: 8.6 g/dL — ABNORMAL LOW (ref 12.0–15.0)
Immature Granulocytes: 11 %
Lymphocytes Relative: 27 %
Lymphs Abs: 1.6 10*3/uL (ref 0.7–4.0)
MCH: 33.2 pg (ref 26.0–34.0)
MCHC: 31.6 g/dL (ref 30.0–36.0)
MCV: 105 fL — ABNORMAL HIGH (ref 80.0–100.0)
Monocytes Absolute: 0.9 10*3/uL (ref 0.1–1.0)
Monocytes Relative: 15 %
Neutro Abs: 2.7 10*3/uL (ref 1.7–7.7)
Neutrophils Relative %: 44 %
Platelets: 355 10*3/uL (ref 150–400)
RBC: 2.59 MIL/uL — ABNORMAL LOW (ref 3.87–5.11)
RDW: 20.9 % — ABNORMAL HIGH (ref 11.5–15.5)
WBC: 5.9 10*3/uL (ref 4.0–10.5)
nRBC: 4.2 % — ABNORMAL HIGH (ref 0.0–0.2)

## 2019-02-25 LAB — TSH: TSH: 4.274 u[IU]/mL — ABNORMAL HIGH (ref 0.308–3.960)

## 2019-02-25 MED ORDER — DEXAMETHASONE SODIUM PHOSPHATE 10 MG/ML IJ SOLN
10.0000 mg | Freq: Once | INTRAMUSCULAR | Status: AC
  Administered 2019-02-25: 10:00:00 10 mg via INTRAVENOUS

## 2019-02-25 MED ORDER — SODIUM CHLORIDE 0.9% FLUSH
10.0000 mL | INTRAVENOUS | Status: DC | PRN
  Administered 2019-02-25: 10 mL via INTRAVENOUS
  Filled 2019-02-25: qty 10

## 2019-02-25 MED ORDER — SODIUM CHLORIDE 0.9 % IV SOLN
2000.0000 mg | Freq: Once | INTRAVENOUS | Status: AC
  Administered 2019-02-25: 11:00:00 2000 mg via INTRAVENOUS
  Filled 2019-02-25: qty 52.6

## 2019-02-25 MED ORDER — PALONOSETRON HCL INJECTION 0.25 MG/5ML
0.2500 mg | Freq: Once | INTRAVENOUS | Status: AC
  Administered 2019-02-25: 10:00:00 0.25 mg via INTRAVENOUS

## 2019-02-25 MED ORDER — PALONOSETRON HCL INJECTION 0.25 MG/5ML
INTRAVENOUS | Status: AC
  Filled 2019-02-25: qty 5

## 2019-02-25 MED ORDER — SODIUM CHLORIDE 0.9 % IV SOLN
Freq: Once | INTRAVENOUS | Status: AC
  Administered 2019-02-25: 10:00:00 via INTRAVENOUS
  Filled 2019-02-25: qty 250

## 2019-02-25 MED ORDER — SODIUM CHLORIDE 0.9% FLUSH
10.0000 mL | INTRAVENOUS | Status: DC | PRN
  Administered 2019-02-25: 10 mL
  Filled 2019-02-25: qty 10

## 2019-02-25 MED ORDER — DEXAMETHASONE SODIUM PHOSPHATE 10 MG/ML IJ SOLN
INTRAMUSCULAR | Status: AC
  Filled 2019-02-25: qty 1

## 2019-02-25 MED ORDER — SODIUM CHLORIDE 0.9 % IV SOLN
250.6000 mg | Freq: Once | INTRAVENOUS | Status: AC
  Administered 2019-02-25: 12:00:00 250 mg via INTRAVENOUS
  Filled 2019-02-25: qty 25

## 2019-02-25 MED ORDER — HEPARIN SOD (PORK) LOCK FLUSH 100 UNIT/ML IV SOLN
500.0000 [IU] | Freq: Once | INTRAVENOUS | Status: AC | PRN
  Administered 2019-02-25: 12:00:00 500 [IU]
  Filled 2019-02-25: qty 5

## 2019-02-25 NOTE — Progress Notes (Signed)
[facility]  [phone] [phone]    ID: [name] OB: 03-14-1961  MR#: [id number]  JOI#:[id number]  Patient Care Team: [name], DO as PCP - General (Family Medicine) Josue Hector, MD as PCP - Cardiology (Cardiology) Magrinat, Virgie Dad, MD as Consulting Physician (Oncology) Brien Few, MD as Consulting Physician (Obstetrics and Gynecology) [name], MD as Consulting Physician (General Surgery) Delrae Rend, MD as Consulting Physician (Endocrinology) Wilford Corner, MD as Consulting Physician (Gastroenterology) Dorna Leitz, MD as Consulting Physician (Orthopedic Surgery) Josue Hector, MD as Consulting Physician (Cardiology) Irene Limbo, MD as Consulting Physician (Plastic Surgery) OTHER MD:    CHIEF COMPLAINT: Triple negative breast cancer, locally recurrent  CURRENT TREATMENT: Neoadjuvant chemotherapy, to add pembrolizumab   INTERVAL HISTORY: Alayjah was seen today for follow-up and treatment of her locally recurrent triple negative breast cancer.  She continues on neoadjuvant chemotherapy consisting of carboplatin and gemcitabine given days 1 and 8 of each 21-day cycle, for 6 cycles.  Today is day 8 cycle 5, dose #9 of 12 planned.   We will add Pembrolizumab once we can obtain authorization from insurance or, if denied, can get it from the manufacturer.  I am awaiting next steps from our chemo authorization specialist, Altamese Dilling.  REVIEW OF SYSTEMS: Brendaliz is doing well today.  She has been nauseated this week from her potassium supplement she was started on due to hypokalemia.  She is hopeful that her potassium level has improved.  Taisa is fatigued.  She has a treadmill that she is walking on when she can. The fatigue has slowed her down some.  Chiyoko also experiences intermittent constipation.  She is taking benefiber, miralax, and dulcolax when needed.  She has this under  control.    Ayme denies any fever, chills, chest pain, palpitations, cough, shortness of breath, mucositis, headaches, vision changes, new pain, bladder changes, or vomiting.  A detailed ROS was otherwise non contributory.     BREAST CANCER HISTORY: From the original intake note of 02/16/2014:  [name] palpated a mass in her left breast early May and brought it immediately to her gynecologist attention. He set her up for bilateral diagnostic mammography and left ultrasonography at [facility] 02/07/2014. Mammography showed a 1.3 cm oval mass with spiculated margins in the left breast at the 11:00 position. This was palpable. Ultrasound showed a 1.1 cm tall her than wide mass with a microlobulated margins. He was hypoechoic. There were no abnormalities noted in the left axilla.  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 Lyndon Code at conference 02/16/2014), triple negative, with an MIB-1 of 50%.  The patient's subsequent history is as detailed below   PAST MEDICAL HISTORY: Past Medical History:  Diagnosis Date   Anxiety    Arthritis    Back pain    Breast cancer ([place])    left   Dyspnea    Fatty liver    GERD (gastroesophageal reflux disease)    Headache(784.0)    PMH : migraines   History of kidney stones    Hypercholesterolemia    Hypertension    Hypothyroidism    Kidney stones    Osteoporosis    Personal history of chemotherapy    Personal history of radiation therapy    PONV (postoperative nausea and vomiting)    difficult to wake up   Pre-diabetes    Psoriasis  PVC's (premature ventricular contractions)    Radiation 08/18/14-10/07/14   Left breast    PAST SURGICAL HISTORY: Past Surgical History:  Procedure Laterality Date   BREAST BIOPSY Left 11/10/2018   BREAST LUMPECTOMY Left 2015   BREAST LUMPECTOMY WITH AXILLARY LYMPH NODE  BIOPSY  6/15   left   CARDIAC CATHETERIZATION     CHOLECYSTECTOMY     COLONOSCOPY N/A 06/27/2014   Procedure: COLONOSCOPY;  Surgeon: Lear Ng, MD;  Location: [facility];  Service: Endoscopy;  Laterality: N/A;   LEFT HEART CATH AND CORONARY ANGIOGRAPHY N/A 07/24/2018   Procedure: LEFT HEART CATH AND CORONARY ANGIOGRAPHY;  Surgeon: Troy Sine, MD;  Location: [facility];  Service: Cardiovascular;  Laterality: N/A;   LIPOMA EXCISION     MYOMECTOMY     PORT-A-CATH REMOVAL N/A 02/03/2015   Procedure: REMOVAL PORT-A-CATH;  Surgeon: [name], MD;  Location: [facility];  Service: General;  Laterality: N/A;   PORTACATH PLACEMENT N/A 03/01/2014   Procedure: INSERTION PORT-A-CATH;  Surgeon: [name], MD;  Location: [facility];  Service: General;  Laterality: N/A;   PORTACATH PLACEMENT N/A 11/18/2018   Procedure: INSERTION PORT-A-CATH WITH ULTRASOUND;  Surgeon: [name], MD;  Location: [facility];  Service: General;  Laterality: N/A;   TONSILLECTOMY     TUBAL LIGATION      FAMILY HISTORY: Family History  Problem Relation Age of Onset   Endometrial cancer Mother 27   Hearing loss Mother    Atrial fibrillation Mother    Lung cancer Father    Brain cancer Father    Heart disease Maternal Aunt    Atrial fibrillation Maternal Aunt    Lung cancer Maternal Uncle    Atrial fibrillation Maternal Uncle    Stroke Maternal Grandmother    Stroke Maternal Grandfather    Bladder Cancer Maternal Uncle    Multiple myeloma Maternal Uncle    Other Son        trisomy 32   The patient's father died at the age of 25 from what may have been metastatic lung cancer. The patient knows very little about the father's side of her family. Her mother is alive at age 44. She was recently diagnosed with endometrial cancer. The patient has one brother, 2 sisters. There is no history of breast or ovarian cancer in the  family.   GYNECOLOGIC HISTORY:  Menarche age 52, first live birth age 33. The patient is GX P3. One child died shortly after birth. She stopped having periods approximately 2005. She did not use hormone replacement. 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 Son [name] is  Nurse, adult in [place]. Daughter [name] lives in [place] and works for [employer] as an Therapist, sports. She has three grandchildren.    ADVANCED DIRECTIVES: Not in place   HEALTH MAINTENANCE: Social History   Tobacco Use   Smoking status: Former Smoker    Packs/day: 0.50    Years: 15.00    Pack years: 7.50    Types: Cigarettes   Smokeless tobacco: Never Used   Tobacco comment: Quit smoking cigarettes in 2002  Substance Use Topics   Alcohol use: Yes    Comment: social   Drug use: No  Colonoscopy: 06/27/2014, normal, [name]  PAP: 2013  Bone density: 04/10/2015, -1.1 ([facility])  Lipid panel:  Allergies  Allergen Reactions   Chlorhexidine Itching   Ciprofloxacin Anxiety, Rash and Other (See Comments)    GI upset   Prednisone Shortness Of Breath and Other (See Comments)    "jacks me up" jittery    Codeine Nausea Only   Cortisone Swelling and Other (See Comments)    "jack me up"     Current Outpatient Medications  Medication Sig Dispense Refill   acetaminophen (TYLENOL) 500 MG tablet Take 1,000 mg by mouth at bedtime as needed for headache.      betamethasone dipropionate (DIPROLENE) 0.05 % cream Apply 1 application topically daily as needed (psoriasis).     bismuth subsalicylate (PEPTO BISMOL) 262 MG chewable tablet Chew 524 mg by mouth as needed for indigestion.     fluticasone (FLONASE) 50 MCG/ACT nasal  spray SPRAY 2 SPRAYS INTO EACH NOSTRIL EVERY DAY (Patient taking differently: Place 1 spray into both nostrils daily as needed for allergies. ) 48 g 1   lidocaine-prilocaine (EMLA) cream Apply to affected area once 30 g 3   liothyronine (CYTOMEL) 5 MCG tablet Take 5 mcg by mouth daily before breakfast.      metFORMIN (GLUCOPHAGE-XR) 500 MG 24 hr tablet Take 1,000 mg by mouth every evening.     omeprazole (PRILOSEC) 20 MG capsule Take 1 capsule (20 mg total) by mouth 2 (two) times daily before a meal. 60 capsule 6   ondansetron (ZOFRAN) 8 MG tablet Take 1 tablet (8 mg total) by mouth every 8 (eight) hours as needed for nausea or vomiting. 20 tablet 0   Phenylephrine-Acetaminophen (TYLENOL SINUS+HEADACHE PO) Take 1 tablet by mouth daily as needed (sinus headaches).     potassium chloride (K-DUR) 10 MEQ tablet Take 1 tablet (10 mEq total) by mouth 2 (two) times daily. 80 tablet 2   prochlorperazine (COMPAZINE) 10 MG tablet Take 1 tablet (10 mg total) by mouth every 6 (six) hours as needed (Nausea or vomiting). 30 tablet 1   propranolol (INDERAL) 10 MG tablet Take 10 mg by mouth daily as needed (heart palpitations).      TIROSINT 100 MCG CAPS Take 100 mcg by mouth daily before breakfast.   4   triamterene-hydrochlorothiazide (MAXZIDE-25) 37.5-25 MG tablet Take 1 tablet by mouth daily. 90 tablet 3   diltiazem (CARDIZEM CD) 240 MG 24 hr capsule Take 1 capsule (240 mg total) by mouth daily. 90 capsule 3   No current facility-administered medications for this visit.     OBJECTIVE:  Vitals:   02/25/19 0850  BP: (!) 132/59  Pulse: 91  Resp: 18  Temp: 98.9 F (37.2 C)  SpO2: 97%     Body mass index is 33.58 kg/m.    ECOG FS:1 - Symptomatic but completely ambulatory GENERAL: Patient is a tired appearing female in no acute distress HEENT:  Sclerae anicteric.  Oropharynx clear and moist. No ulcerations or evidence of oropharyngeal candidiasis. Neck is supple.  NODES:  No cervical,  supraclavicular, or axillary lymphadenopathy palpated.  BREAST EXAM:  Deferred. LUNGS:  Clear to auscultation bilaterally.  No wheezes or rhonchi. HEART:  Regular rate and rhythm. No murmur appreciated. ABDOMEN:  Soft, nontender.  Positive, normoactive bowel sounds. No organomegaly palpated. MSK:  No focal spinal tenderness to palpation.  EXTREMITIES:  No peripheral edema.   SKIN:  Clear with no obvious rashes or skin changes. No nail dyscrasia. NEURO:  Nonfocal. Well oriented.  Appropriate affect.    LAB RESULTS: No results found for: SPEP  Lab Results  Component Value Date   WBC 6.6 02/18/2019   NEUTROABS 4.1 02/18/2019   HGB 9.1 (L) 02/18/2019   HCT 28.7 (L) 02/18/2019   MCV 104.4 (H) 02/18/2019   PLT 352 02/18/2019      Chemistry      Component Value Date/Time   NA 140 02/18/2019 0820   NA 140 06/19/2017 1312   K 2.9 (LL) 02/18/2019 0820   K 3.5 06/19/2017 1312   CL 102 02/18/2019 0820   CO2 24 02/18/2019 0820   CO2 25 06/19/2017 1312   BUN 11 02/18/2019 0820   BUN 18.4 06/19/2017 1312   CREATININE 0.83 02/18/2019 0820   CREATININE 0.8 06/19/2017 1312      Component Value Date/Time   CALCIUM 9.4 02/18/2019 0820   CALCIUM 10.2 06/19/2017 1312   ALKPHOS 73 02/18/2019 0820   ALKPHOS 86 06/19/2017 1312   AST 37 02/18/2019 0820   AST 15 06/19/2017 1312   ALT 53 (H) 02/18/2019 0820   ALT 22 06/19/2017 1312   BILITOT 0.4 02/18/2019 0820   BILITOT 0.26 06/19/2017 1312       No results found for: LABCA2  No components found for: LABCA125  No results for input(s): INR in the last 168 hours.  Urinalysis    Component Value Date/Time   COLORURINE YELLOW 03/13/2009 2251   APPEARANCEUR CLOUDY (A) 03/13/2009 2251   LABSPEC 1.020 06/15/2014 1101   PHURINE 6.0 06/15/2014 1101   PHURINE 5.5 03/13/2009 2251   GLUCOSEU Negative 06/15/2014 1101   HGBUR Negative 06/15/2014 1101   HGBUR NEGATIVE 03/13/2009 2251   BILIRUBINUR Color Interference 06/15/2014 1101    KETONESUR Negative 06/15/2014 1101   KETONESUR NEGATIVE 03/13/2009 2251   PROTEINUR 30 06/15/2014 1101   PROTEINUR NEGATIVE 03/13/2009 2251   UROBILINOGEN 0.2 06/15/2014 1101   NITRITE Negative 06/15/2014 1101   NITRITE NEGATIVE 03/13/2009 2251   LEUKOCYTESUR Trace 06/15/2014 1101    STUDIES: No results found.  ASSESSMENT: 58 y.o. [place] woman status post left breast upper inner quadrant biopsy 02/08/2014 for a clinical T1c N0, stage IA invasive ductal carcinoma, grade 3, triple negative, with an MIB-1 of 50%.  (1) status post left lumpectomy and sentinel lymph node sampling 03/01/2014 for a pT1c pN0, stage IA invasive ductal carcinoma, grade 3, with negative margins, repeat prognostic panel again triple negative.  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  Left breast with breath hold technique/ 45 Gy at 1.8 Gy per fraction x 25 fractions.   Left breast boost/ 16 Gy at 2 Gy per fraction x 8 fractions  (4) genetics counseling 07/06/2015 through the Breast/Ovarian cancer gene panel offered by [facility] found no deleterious mutations in ATM, BARD1, BRCA1, BRCA2, BRIP1, CDH1, CHEK2, EPCAM, FANCC, MLH1, MSH2, MSH6, NBN, PALB2, PMS2, PTEN, RAD51C, RAD51D, TP53, and XRCC2.  (5) question of sleep apnea  (6) transverse colon lesion noted on CT scan from 06/17/2014 - negative biopsy, also showed fatty liver  (a) repeat CT scan of the abdomen and pelvis with contrast 02/15/2016 negative  RECURRENT DISEASE:  February 2020 (7) biopsy of palpable mass upper  inner left breast 11/10/2018 confirms invasive ductal carcinoma, grade 2 or 3, triple negative, with an MIB-1 of 80%.  (a) CT scans of the chest abdomen and pelvis with contrast showed no stage IV disease  (b) bone scan 11/13/2018 shows no definite metastasis to bone  (8) neoadjuvant chemotherapy will consist of carboplatin and gemcitabine given days 1 and 8 of each 21-day cycle, for 6 cycles, started 11/26/2018  (a) repeat ultrasound 01/25/2019 (after 3 cycles) shows no change in measurable breast mass  (b) pembrolizumab to be added   (c) repeat ultrasound preop to assess pembrolizumab response  (9) bilateral mastectomies with left sentinel lymph node sampling and immediate reconstruction to follow   PLAN: Lareina is doing well today.  None of her labs have resulted at this point.  She can proceed with her cycle 5 day 8 treatment with Gemcitabine and Carboplatin, so long as her lab work is within acceptable parameters.    She will not receive Pembrolizumab until we are able to get everything processed with insurance, or perhaps through the manufacturer.  I am awaiting on a number to call to talk on her insurance company from [place].    Ernestene will continue walking when she can.  We will f/u with her on her potassium once those results are available.  We reviewed a high potassium diet, which she is eating.  She will continue on her bowel regimen as it is working for her.    We will see [place] back in 2 weeks for cycle 6 day 1 of her treatment.  She knows to call for any questions or concerns prior to her next appointment with [place].  A total of (20) minutes of face-to-face time was spent with this patient with greater than 50% of that time in counseling and care-coordination.      Wilber Bihari 02/25/19 9:25 AM Medical Oncology and Hematology [facility] [address] [place], [place] [address] Tel. [phone]    Fax. [phone]

## 2019-02-25 NOTE — Patient Instructions (Signed)
[facility] Discharge Instructions for Patients Receiving Chemotherapy  Today you received the following chemotherapy agents Gemzar and Carboplatin   To help prevent nausea and vomiting after your treatment, we encourage you to take your nausea medication as directed.    If you develop nausea and vomiting that is not controlled by your nausea medication, call the clinic.   BELOW ARE SYMPTOMS THAT SHOULD BE REPORTED IMMEDIATELY:  *FEVER GREATER THAN 100.5 F  *CHILLS WITH OR WITHOUT FEVER  NAUSEA AND VOMITING THAT IS NOT CONTROLLED WITH YOUR NAUSEA MEDICATION  *UNUSUAL SHORTNESS OF BREATH  *UNUSUAL BRUISING OR BLEEDING  TENDERNESS IN MOUTH AND THROAT WITH OR WITHOUT PRESENCE OF ULCERS  *URINARY PROBLEMS  *BOWEL PROBLEMS  UNUSUAL RASH Items with * indicate a potential emergency and should be followed up as soon as possible.  Feel free to call the clinic should you have any questions or concerns. The clinic phone number is [phone].  Please show the CHEMO ALERT CARD at check-in to the Emergency Department and triage nurse.   

## 2019-02-26 ENCOUNTER — Other Ambulatory Visit: Payer: Self-pay | Admitting: *Deleted

## 2019-02-26 DIAGNOSIS — C50912 Malignant neoplasm of unspecified site of left female breast: Secondary | ICD-10-CM

## 2019-03-01 ENCOUNTER — Other Ambulatory Visit: Payer: Self-pay | Admitting: Oncology

## 2019-03-01 DIAGNOSIS — C50912 Malignant neoplasm of unspecified site of left female breast: Secondary | ICD-10-CM

## 2019-03-01 NOTE — Progress Notes (Signed)
I called Alexandria Bradley and gave her the results of the Caris tests which shows her tumor to be PD/L1 negative.  We are submitting this to her insurance.  Once we have their determination we will proceed.  She was wondering what her TSH showed.  It is 4.274.  We do not need to make any adjustments at this point.

## 2019-03-05 ENCOUNTER — Ambulatory Visit: Payer: 59 | Admitting: Cardiovascular Disease

## 2019-03-10 NOTE — Progress Notes (Signed)
[facility]  [phone] [phone]    ID: Alexandria Bradley OB: Feb 04, 1961  MR#: [id number]  DVV#:[id number]  Patient Care Team: [name], DO as PCP - General (Family Medicine) Josue Hector, MD as PCP - Cardiology (Cardiology) Kayon Dozier, Virgie Dad, MD as Consulting Physician (Oncology) Brien Few, MD as Consulting Physician (Obstetrics and Gynecology) [name], MD as Consulting Physician (General Surgery) Delrae Rend, MD as Consulting Physician (Endocrinology) Wilford Corner, MD as Consulting Physician (Gastroenterology) Dorna Leitz, MD as Consulting Physician (Orthopedic Surgery) Josue Hector, MD as Consulting Physician (Cardiology) Irene Limbo, MD as Consulting Physician (Plastic Surgery) OTHER MD:    CHIEF COMPLAINT: Triple negative breast cancer, locally recurrent  CURRENT TREATMENT: Neoadjuvant chemotherapy   INTERVAL HISTORY: Alexandria Bradley was seen today for follow-up and treatment of her locally recurrent triple negative breast cancer.  She continues on neoadjuvant chemotherapy consisting of carboplatin and gemcitabine given days 1 and 8 of each 21-day cycle, for 6 cycles.  Today is day 1 cycle 6, her final cycle. She reports having no energy.   She is scheduled to undergo bilateral breast MRI on 03/19/2019, and bilateral mastectomy under Dr. Donne Hazel on 04/12/2019. She is planning on delayed reconstruction with Dr. Iran Planas.  We finally received the results of Caris testing and that was discussed in detail today   REVIEW OF SYSTEMS: Alexandria Bradley notes a rash to the upper mid chest that has started bleeding and itching without pain. She notes the bleeding didn't stop, so she had to use a cold compress. She describes a feeling that she describes is not quite burning. She also reports her restless legs are keeping her awake at night. She states everything makes her stomach hurt; she has a history of  stomach ulcers. She had taken tylenol for her sinuses, but she was afraid to take too much because of her stomach. She states she tries to exercise 3 times a week, but she has been too tired. A detailed review of systems was otherwise entirely negative.   BREAST CANCER HISTORY: From the original intake note of 02/16/2014:  Alexandria Bradley palpated a mass in her left breast early May and brought it immediately to her gynecologist attention. He set her up for bilateral diagnostic mammography and left ultrasonography at [facility] 02/07/2014. Mammography showed a 1.3 cm oval mass with spiculated margins in the left breast at the 11:00 position. This was palpable. Ultrasound showed a 1.1 cm tall her than wide mass with a microlobulated margins. He was hypoechoic. There were no abnormalities noted in the left axilla.  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 Lyndon Code at conference 02/16/2014), triple negative, with an MIB-1 of 50%.  The patient's subsequent history is as detailed below   PAST MEDICAL HISTORY: Past Medical History:  Diagnosis Date   Anxiety    Arthritis    Back pain    Breast cancer ([place])    left   Dyspnea    Fatty liver    GERD (gastroesophageal reflux disease)    Headache(784.0)    PMH : migraines   History of kidney stones    Hypercholesterolemia    Hypertension    Hypothyroidism    Kidney stones    Osteoporosis    Personal history of chemotherapy    Personal history of radiation therapy    PONV (postoperative nausea and vomiting)    difficult to  wake up   Pre-diabetes    Psoriasis    PVC's (premature ventricular contractions)    Radiation 08/18/14-10/07/14   Left breast    PAST SURGICAL HISTORY: Past Surgical History:  Procedure Laterality Date   BREAST BIOPSY Left 11/10/2018   BREAST LUMPECTOMY Left 2015   BREAST LUMPECTOMY  WITH AXILLARY LYMPH NODE BIOPSY  6/15   left   CARDIAC CATHETERIZATION     CHOLECYSTECTOMY     COLONOSCOPY N/A 06/27/2014   Procedure: COLONOSCOPY;  Surgeon: Lear Ng, MD;  Location: [facility];  Service: Endoscopy;  Laterality: N/A;   LEFT HEART CATH AND CORONARY ANGIOGRAPHY N/A 07/24/2018   Procedure: LEFT HEART CATH AND CORONARY ANGIOGRAPHY;  Surgeon: Troy Sine, MD;  Location: [facility];  Service: Cardiovascular;  Laterality: N/A;   LIPOMA EXCISION     MYOMECTOMY     PORT-A-CATH REMOVAL N/A 02/03/2015   Procedure: REMOVAL PORT-A-CATH;  Surgeon: [name], MD;  Location: [facility];  Service: General;  Laterality: N/A;   PORTACATH PLACEMENT N/A 03/01/2014   Procedure: INSERTION PORT-A-CATH;  Surgeon: [name], MD;  Location: [facility];  Service: General;  Laterality: N/A;   PORTACATH PLACEMENT N/A 11/18/2018   Procedure: INSERTION PORT-A-CATH WITH ULTRASOUND;  Surgeon: [name], MD;  Location: [facility];  Service: General;  Laterality: N/A;   TONSILLECTOMY     TUBAL LIGATION      FAMILY HISTORY: Family History  Problem Relation Age of Onset   Endometrial cancer Mother 108   Hearing loss Mother    Atrial fibrillation Mother    Lung cancer Father    Brain cancer Father    Heart disease Maternal Aunt    Atrial fibrillation Maternal Aunt    Lung cancer Maternal Uncle    Atrial fibrillation Maternal Uncle    Stroke Maternal Grandmother    Stroke Maternal Grandfather    Bladder Cancer Maternal Uncle    Multiple myeloma Maternal Uncle    Other Son        trisomy 28   The patient's father died at the age of 92 from what may have been metastatic lung cancer. The patient knows very little about the father's side of her family. Her mother is alive at age 75. She was recently diagnosed with endometrial cancer. The patient has one brother, 2 sisters. There is no history of breast or ovarian  cancer in the family.   GYNECOLOGIC HISTORY:  Menarche age 24, first live birth age 51. The patient is GX P3. One child died shortly after birth. She stopped having periods approximately 2005. She did not use hormone replacement. 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 Son [name] is  Nurse, adult in [place]. Daughter [name] lives in [place] and works for [employer] as an Therapist, sports. She has three grandchildren.    ADVANCED DIRECTIVES: Not in place   HEALTH MAINTENANCE: Social History   Tobacco Use   Smoking status: Former Smoker    Packs/day: 0.50    Years: 15.00    Pack years: 7.50    Types: Cigarettes   Smokeless tobacco: Never Used   Tobacco comment: Quit smoking cigarettes in 2002  Substance Use Topics   Alcohol use: Yes  Comment: social   Drug use: No     Colonoscopy: 06/27/2014, normal, [name]  PAP: 2013  Bone density: 04/10/2015, -1.1 ([facility])  Lipid panel:  Allergies  Allergen Reactions   Chlorhexidine Itching   Ciprofloxacin Anxiety, Rash and Other (See Comments)    GI upset   Prednisone Shortness Of Breath and Other (See Comments)    "jacks me up" jittery    Codeine Nausea Only   Cortisone Swelling and Other (See Comments)    "jack me up"     Current Outpatient Medications  Medication Sig Dispense Refill   acetaminophen (TYLENOL) 500 MG tablet Take 1,000 mg by mouth at bedtime as needed for headache.      betamethasone dipropionate (DIPROLENE) 0.05 % cream Apply 1 application topically daily as needed (psoriasis).     bismuth subsalicylate (PEPTO BISMOL) 262 MG chewable tablet Chew 524 mg by mouth as needed for indigestion.     diltiazem (CARDIZEM CD) 240  MG 24 hr capsule Take 1 capsule (240 mg total) by mouth daily. 90 capsule 3   fluticasone (FLONASE) 50 MCG/ACT nasal spray SPRAY 2 SPRAYS INTO EACH NOSTRIL EVERY DAY (Patient taking differently: Place 1 spray into both nostrils daily as needed for allergies. ) 48 g 1   lidocaine-prilocaine (EMLA) cream Apply to affected area once 30 g 3   liothyronine (CYTOMEL) 5 MCG tablet Take 5 mcg by mouth daily before breakfast.      metFORMIN (GLUCOPHAGE-XR) 500 MG 24 hr tablet Take 1,000 mg by mouth every evening.     omeprazole (PRILOSEC) 20 MG capsule Take 1 capsule (20 mg total) by mouth 2 (two) times daily before a meal. 60 capsule 6   ondansetron (ZOFRAN) 8 MG tablet Take 1 tablet (8 mg total) by mouth every 8 (eight) hours as needed for nausea or vomiting. 20 tablet 0   Phenylephrine-Acetaminophen (TYLENOL SINUS+HEADACHE PO) Take 1 tablet by mouth daily as needed (sinus headaches).     potassium chloride (K-DUR) 10 MEQ tablet Take 1 tablet (10 mEq total) by mouth 2 (two) times daily. 80 tablet 2   prochlorperazine (COMPAZINE) 10 MG tablet Take 1 tablet (10 mg total) by mouth every 6 (six) hours as needed (Nausea or vomiting). 30 tablet 1   propranolol (INDERAL) 10 MG tablet Take 10 mg by mouth daily as needed (heart palpitations).      TIROSINT 100 MCG CAPS Take 100 mcg by mouth daily before breakfast.   4   triamterene-hydrochlorothiazide (MAXZIDE-25) 37.5-25 MG tablet Take 1 tablet by mouth daily. 90 tablet 3   No current facility-administered medications for this visit.     OBJECTIVE: Middle-aged white woman who appears stated age 58:   03/11/19 0849  BP: 129/62  Pulse: 92  Resp: 18  Temp: 98.9 F (37.2 C)  SpO2: 98%     Body mass index is 33.73 kg/m.    ECOG FS:1 - Symptomatic but completely ambulatory  Sclerae unicteric, EOMs intact Wearing a mask No cervical or supraclavicular adenopathy Lungs no rales or rhonchi Heart regular rate and rhythm Abd soft, nontender,  positive bowel sounds MSK no focal spinal tenderness, no upper extremity lymphedema Neuro: nonfocal, well oriented, appropriate affect Breasts: I do not palpate a mass in either breast.  Both axillae are benign. Skin: There is a papular erythematous scattered rash which is at midline over the upper sternal area, with mild excoriations.    LAB RESULTS: No results found for: SPEP  Lab Results  Component Value  Date   WBC 7.1 03/11/2019   NEUTROABS 4.8 03/11/2019   HGB 9.3 (L) 03/11/2019   HCT 29.9 (L) 03/11/2019   MCV 109.5 (H) 03/11/2019   PLT 235 03/11/2019      Chemistry      Component Value Date/Time   NA 140 03/11/2019 0823   NA 140 06/19/2017 1312   K 3.5 03/11/2019 0823   K 3.5 06/19/2017 1312   CL 102 03/11/2019 0823   CO2 23 03/11/2019 0823   CO2 25 06/19/2017 1312   BUN 10 03/11/2019 0823   BUN 18.4 06/19/2017 1312   CREATININE 0.80 03/11/2019 0823   CREATININE 0.8 06/19/2017 1312      Component Value Date/Time   CALCIUM 9.7 03/11/2019 0823   CALCIUM 10.2 06/19/2017 1312   ALKPHOS 79 03/11/2019 0823   ALKPHOS 86 06/19/2017 1312   AST 34 03/11/2019 0823   AST 15 06/19/2017 1312   ALT 41 03/11/2019 0823   ALT 22 06/19/2017 1312   BILITOT 0.5 03/11/2019 0823   BILITOT 0.26 06/19/2017 1312       No results found for: LABCA2  No components found for: LABCA125  No results for input(s): INR in the last 168 hours.  Urinalysis    Component Value Date/Time   COLORURINE YELLOW 03/13/2009 2251   APPEARANCEUR CLOUDY (A) 03/13/2009 2251   LABSPEC 1.020 06/15/2014 1101   PHURINE 6.0 06/15/2014 1101   PHURINE 5.5 03/13/2009 2251   GLUCOSEU Negative 06/15/2014 1101   HGBUR Negative 06/15/2014 1101   HGBUR NEGATIVE 03/13/2009 2251   BILIRUBINUR Color Interference 06/15/2014 1101   KETONESUR Negative 06/15/2014 1101   KETONESUR NEGATIVE 03/13/2009 2251   PROTEINUR 30 06/15/2014 1101   PROTEINUR NEGATIVE 03/13/2009 2251   UROBILINOGEN 0.2 06/15/2014 1101    NITRITE Negative 06/15/2014 1101   NITRITE NEGATIVE 03/13/2009 2251   LEUKOCYTESUR Trace 06/15/2014 1101    STUDIES: No results found.  ASSESSMENT: 58 y.o. [place] woman status post left breast upper inner quadrant biopsy 02/08/2014 for a clinical T1c N0, stage IA invasive ductal carcinoma, grade 3, triple negative, with an MIB-1 of 50%.  (1) status post left lumpectomy and sentinel lymph node sampling 03/01/2014 for a pT1c pN0, stage IA invasive ductal carcinoma, grade 3, with negative margins, repeat prognostic panel again triple negative.  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  Left breast with breath hold technique/ 45 Gy at 1.8 Gy per fraction x 25 fractions.   Left breast boost/ 16 Gy at 2 Gy per fraction x 8 fractions  (4) genetics counseling 07/06/2015 through the Breast/Ovarian cancer gene panel offered by [facility] found no deleterious mutations in ATM, BARD1, BRCA1, BRCA2, BRIP1, CDH1, CHEK2, EPCAM, FANCC, MLH1, MSH2, MSH6, NBN, PALB2, PMS2, PTEN, RAD51C, RAD51D, TP53, and XRCC2.  (5) question of sleep apnea  (6) transverse colon lesion noted on CT scan from 06/17/2014 - negative biopsy, also showed fatty liver  (a) repeat CT scan of the abdomen and pelvis with contrast 02/15/2016 negative  RECURRENT DISEASE: February 2020 (7) biopsy of palpable mass upper inner left breast 11/10/2018 confirms invasive ductal carcinoma, grade 2 or 3,  triple negative, with an MIB-1 of 80%.  (a) CT scans of the chest abdomen and pelvis with contrast  showed no stage IV disease  (b) bone scan 11/13/2018 shows no definite metastasis to bone  (8) neoadjuvant chemotherapy consisting of carboplatin and gemcitabine given days 1 and 8 of each 21-day cycle, for 6 cycles, started 11/26/2018  (a) repeat ultrasound 01/25/2019 (after 3 cycles) shows no change in measurable breast mass  (b) pembrolizumab to be added   (c) repeat ultrasound preop to assess response  (9) bilateral mastectomies with left sentinel lymph node sampling and immediate reconstruction to follow   PLAN: Thaily is starting her final cycle of chemotherapy today.  She will be treated today and a week from today.  After that she will proceed to MRI and then definitive surgery.  We discussed the results of Caris testing.  This shows no PD-L1 activity.  It also shows a low mutational burden.  In general the predicted benefit from pembrolizumab in patients with this pattern is in the 1% range.  However this data is rapidly changing.  The point of this discussion is what to do with her port.  If we are going to do pembrolizumab adjuvantly we would leave the port in.  Otherwise we will remove it.  We decided that we will take a look at her MRI and if your MRI looks favorable we will have the port removed at the time of surgery.  She is in agreement with this plan.  Note that she will not qualify for S14 18 because this is a recurrent tumor  She knows to call for any other issue that may develop before her next visit here.  Virgie Dad. Lorain Keast, MD 03/11/19 9:22 AM Medical Oncology and Hematology [facility] [address] [place], [place] [address] Tel. [phone]    Fax. [phone]   I, Wilburn Mylar, am acting as scribe for Dr. Virgie Dad. Murl Golladay.  I, Lurline Del MD, have reviewed the above documentation for accuracy and completeness, and I agree with the above.

## 2019-03-11 ENCOUNTER — Inpatient Hospital Stay: Payer: No Typology Code available for payment source

## 2019-03-11 ENCOUNTER — Inpatient Hospital Stay: Payer: No Typology Code available for payment source | Attending: Oncology

## 2019-03-11 ENCOUNTER — Other Ambulatory Visit: Payer: Self-pay

## 2019-03-11 ENCOUNTER — Inpatient Hospital Stay (HOSPITAL_BASED_OUTPATIENT_CLINIC_OR_DEPARTMENT_OTHER): Payer: No Typology Code available for payment source | Admitting: Oncology

## 2019-03-11 VITALS — BP 129/62 | HR 92 | Temp 98.9°F | Resp 18 | Ht 65.0 in | Wt 202.7 lb

## 2019-03-11 DIAGNOSIS — C50212 Malignant neoplasm of upper-inner quadrant of left female breast: Secondary | ICD-10-CM | POA: Insufficient documentation

## 2019-03-11 DIAGNOSIS — Z9221 Personal history of antineoplastic chemotherapy: Secondary | ICD-10-CM | POA: Diagnosis not present

## 2019-03-11 DIAGNOSIS — Z171 Estrogen receptor negative status [ER-]: Secondary | ICD-10-CM | POA: Insufficient documentation

## 2019-03-11 DIAGNOSIS — Z923 Personal history of irradiation: Secondary | ICD-10-CM

## 2019-03-11 DIAGNOSIS — K760 Fatty (change of) liver, not elsewhere classified: Secondary | ICD-10-CM

## 2019-03-11 DIAGNOSIS — C50912 Malignant neoplasm of unspecified site of left female breast: Secondary | ICD-10-CM

## 2019-03-11 DIAGNOSIS — Z95828 Presence of other vascular implants and grafts: Secondary | ICD-10-CM

## 2019-03-11 DIAGNOSIS — Z5111 Encounter for antineoplastic chemotherapy: Secondary | ICD-10-CM | POA: Diagnosis not present

## 2019-03-11 DIAGNOSIS — Z87891 Personal history of nicotine dependence: Secondary | ICD-10-CM | POA: Insufficient documentation

## 2019-03-11 LAB — CBC WITH DIFFERENTIAL/PLATELET
Abs Immature Granulocytes: 0.09 10*3/uL — ABNORMAL HIGH (ref 0.00–0.07)
Basophils Absolute: 0 10*3/uL (ref 0.0–0.1)
Basophils Relative: 0 %
Eosinophils Absolute: 0.1 10*3/uL (ref 0.0–0.5)
Eosinophils Relative: 2 %
HCT: 29.9 % — ABNORMAL LOW (ref 36.0–46.0)
Hemoglobin: 9.3 g/dL — ABNORMAL LOW (ref 12.0–15.0)
Immature Granulocytes: 1 %
Lymphocytes Relative: 15 %
Lymphs Abs: 1.1 10*3/uL (ref 0.7–4.0)
MCH: 34.1 pg — ABNORMAL HIGH (ref 26.0–34.0)
MCHC: 31.1 g/dL (ref 30.0–36.0)
MCV: 109.5 fL — ABNORMAL HIGH (ref 80.0–100.0)
Monocytes Absolute: 1 10*3/uL (ref 0.1–1.0)
Monocytes Relative: 15 %
Neutro Abs: 4.8 10*3/uL (ref 1.7–7.7)
Neutrophils Relative %: 67 %
Platelets: 235 10*3/uL (ref 150–400)
RBC: 2.73 MIL/uL — ABNORMAL LOW (ref 3.87–5.11)
RDW: 19.5 % — ABNORMAL HIGH (ref 11.5–15.5)
WBC: 7.1 10*3/uL (ref 4.0–10.5)
nRBC: 0.3 % — ABNORMAL HIGH (ref 0.0–0.2)

## 2019-03-11 LAB — COMPREHENSIVE METABOLIC PANEL
ALT: 41 U/L (ref 0–44)
AST: 34 U/L (ref 15–41)
Albumin: 4.3 g/dL (ref 3.5–5.0)
Alkaline Phosphatase: 79 U/L (ref 38–126)
Anion gap: 15 (ref 5–15)
BUN: 10 mg/dL (ref 6–20)
CO2: 23 mmol/L (ref 22–32)
Calcium: 9.7 mg/dL (ref 8.9–10.3)
Chloride: 102 mmol/L (ref 98–111)
Creatinine, Ser: 0.8 mg/dL (ref 0.44–1.00)
GFR calc Af Amer: >60 mL/min (ref >60)
GFR calc non Af Amer: >60 mL/min (ref >60)
Glucose, Bld: 149 mg/dL — ABNORMAL HIGH (ref 70–99)
Potassium: 3.5 mmol/L (ref 3.5–5.1)
Sodium: 140 mmol/L (ref 135–145)
Total Bilirubin: 0.5 mg/dL (ref 0.3–1.2)
Total Protein: 7.3 g/dL (ref 6.5–8.1)

## 2019-03-11 LAB — TSH: TSH: 3.91 u[IU]/mL (ref 0.308–3.960)

## 2019-03-11 MED ORDER — PALONOSETRON HCL INJECTION 0.25 MG/5ML
INTRAVENOUS | Status: AC
  Filled 2019-03-11: qty 5

## 2019-03-11 MED ORDER — HEPARIN SOD (PORK) LOCK FLUSH 100 UNIT/ML IV SOLN
500.0000 [IU] | Freq: Once | INTRAVENOUS | Status: AC | PRN
  Administered 2019-03-11: 500 [IU]
  Filled 2019-03-11: qty 5

## 2019-03-11 MED ORDER — SODIUM CHLORIDE 0.9 % IV SOLN
250.6000 mg | Freq: Once | INTRAVENOUS | Status: AC
  Administered 2019-03-11: 250 mg via INTRAVENOUS
  Filled 2019-03-11: qty 25

## 2019-03-11 MED ORDER — PALONOSETRON HCL INJECTION 0.25 MG/5ML
0.2500 mg | Freq: Once | INTRAVENOUS | Status: AC
  Administered 2019-03-11: 0.25 mg via INTRAVENOUS

## 2019-03-11 MED ORDER — SODIUM CHLORIDE 0.9 % IV SOLN
Freq: Once | INTRAVENOUS | Status: AC
  Administered 2019-03-11: 10:00:00 via INTRAVENOUS
  Filled 2019-03-11: qty 250

## 2019-03-11 MED ORDER — SODIUM CHLORIDE 0.9 % IV SOLN
2000.0000 mg | Freq: Once | INTRAVENOUS | Status: AC
  Administered 2019-03-11: 2000 mg via INTRAVENOUS
  Filled 2019-03-11: qty 52.6

## 2019-03-11 MED ORDER — SODIUM CHLORIDE 0.9% FLUSH
10.0000 mL | INTRAVENOUS | Status: DC | PRN
  Administered 2019-03-11: 10 mL via INTRAVENOUS
  Filled 2019-03-11: qty 10

## 2019-03-11 MED ORDER — DEXAMETHASONE SODIUM PHOSPHATE 10 MG/ML IJ SOLN
10.0000 mg | Freq: Once | INTRAMUSCULAR | Status: AC
  Administered 2019-03-11: 10 mg via INTRAVENOUS

## 2019-03-11 MED ORDER — SODIUM CHLORIDE 0.9% FLUSH
10.0000 mL | INTRAVENOUS | Status: DC | PRN
  Administered 2019-03-11: 10 mL
  Filled 2019-03-11: qty 10

## 2019-03-11 MED ORDER — DEXAMETHASONE SODIUM PHOSPHATE 10 MG/ML IJ SOLN
INTRAMUSCULAR | Status: AC
  Filled 2019-03-11: qty 1

## 2019-03-11 NOTE — Patient Instructions (Signed)
[facility] Discharge Instructions for Patients Receiving Chemotherapy  Today you received the following chemotherapy agents Gemcitabine (GEMZAR) & Carboplatin (PARAPLATIN).  To help prevent nausea and vomiting after your treatment, we encourage you to take your nausea medication as prescribed.   If you develop nausea and vomiting that is not controlled by your nausea medication, call the clinic.   BELOW ARE SYMPTOMS THAT SHOULD BE REPORTED IMMEDIATELY:  *FEVER GREATER THAN 100.5 F  *CHILLS WITH OR WITHOUT FEVER  NAUSEA AND VOMITING THAT IS NOT CONTROLLED WITH YOUR NAUSEA MEDICATION  *UNUSUAL SHORTNESS OF BREATH  *UNUSUAL BRUISING OR BLEEDING  TENDERNESS IN MOUTH AND THROAT WITH OR WITHOUT PRESENCE OF ULCERS  *URINARY PROBLEMS  *BOWEL PROBLEMS  UNUSUAL RASH Items with * indicate a potential emergency and should be followed up as soon as possible.  Feel free to call the clinic should you have any questions or concerns. The clinic phone number is [phone].  Please show the CHEMO ALERT CARD at check-in to the Emergency Department and triage nurse.  Coronavirus (COVID-19) Are you at risk?  Are you at risk for the Coronavirus (COVID-19)?  To be considered HIGH RISK for Coronavirus (COVID-19), you have to meet the following criteria:  . Traveled to [place], [place], [place], [place] or [place]; or in the [place] to [place], [place], [place], or [place]; and have fever, cough, and shortness of breath within the last 2 weeks of travel OR . Been in close contact with a person diagnosed with COVID-19 within the last 2 weeks and have fever, cough, and shortness of breath . IF YOU DO NOT MEET THESE CRITERIA, YOU ARE CONSIDERED LOW RISK FOR COVID-19.  What to do if you are HIGH RISK for COVID-19?  . If you are having a medical emergency, call 911. . Seek medical care right away. Before you go to a doctor's office, urgent care or emergency department,  call ahead and tell them about your recent travel, contact with someone diagnosed with COVID-19, and your symptoms. You should receive instructions from your physician's office regarding next steps of care.  . When you arrive at healthcare provider, tell the healthcare staff immediately you have returned from visiting [place], [place], [place], [place] or [place]; or traveled in the [place] to [place], [place], [place], or [place]; in the last two weeks or you have been in close contact with a person diagnosed with COVID-19 in the last 2 weeks.   . Tell the health care staff about your symptoms: fever, cough and shortness of breath. . After you have been seen by a medical provider, you will be either: o Tested for (COVID-19) and discharged home on quarantine except to seek medical care if symptoms worsen, and asked to  - Stay home and avoid contact with others until you get your results (4-5 days)  - Avoid travel on public transportation if possible (such as bus, train, or airplane) or o Sent to the Emergency Department by EMS for evaluation, COVID-19 testing, and possible admission depending on your condition and test results.  What to do if you are LOW RISK for COVID-19?  Reduce your risk of any infection by using the same precautions used for avoiding the common cold or flu:  . Wash your hands often with soap and warm water for at least 20 seconds.  If soap and water are not readily available, use an alcohol-based hand sanitizer with at least 60% alcohol.  .   If coughing or sneezing, cover your mouth and nose by coughing or sneezing into the elbow areas of your shirt or coat, into a tissue or into your sleeve (not your hands). . Avoid shaking hands with others and consider head nods or verbal greetings only. . Avoid touching your eyes, nose, or mouth with unwashed hands.  . Avoid close contact with people who are sick. . Avoid places or events with large numbers of people in one  location, like concerts or sporting events. . Carefully consider travel plans you have or are making. . If you are planning any travel outside or inside the [place], visit the CDC's Travelers' Health webpage for the latest health notices. . If you have some symptoms but not all symptoms, continue to monitor at home and seek medical attention if your symptoms worsen. . If you are having a medical emergency, call 911.   ADDITIONAL HEALTHCARE OPTIONS FOR PATIENTS  [facility] / e-Visit: [email or web address]         [facility]: [phone]  [facility]: [phone]                   [facility]: [phone]   

## 2019-03-16 ENCOUNTER — Encounter: Payer: Self-pay | Admitting: *Deleted

## 2019-03-16 ENCOUNTER — Encounter: Payer: Self-pay | Admitting: Pharmacy Technician

## 2019-03-16 ENCOUNTER — Other Ambulatory Visit: Payer: Self-pay | Admitting: Oncology

## 2019-03-16 NOTE — Progress Notes (Signed)
The patient is approved for drug assistance by [employer] for [employer].  The enrollment is effective until 03/15/20 and is based on insurance denial.  Drug replacement will begin on DOS 03/18/19.

## 2019-03-16 NOTE — Progress Notes (Signed)
I called Alexandria Bradley and told her we finally got the pembrolizumab approved.  She wonders if she wants to wait until after the surgery to start.  That will be her choice

## 2019-03-18 ENCOUNTER — Inpatient Hospital Stay: Payer: No Typology Code available for payment source

## 2019-03-18 ENCOUNTER — Other Ambulatory Visit: Payer: Self-pay

## 2019-03-18 ENCOUNTER — Encounter: Payer: Self-pay | Admitting: *Deleted

## 2019-03-18 VITALS — BP 151/81 | HR 94 | Temp 98.3°F | Resp 18

## 2019-03-18 DIAGNOSIS — C50912 Malignant neoplasm of unspecified site of left female breast: Secondary | ICD-10-CM

## 2019-03-18 DIAGNOSIS — C50212 Malignant neoplasm of upper-inner quadrant of left female breast: Secondary | ICD-10-CM

## 2019-03-18 DIAGNOSIS — Z95828 Presence of other vascular implants and grafts: Secondary | ICD-10-CM

## 2019-03-18 DIAGNOSIS — Z5111 Encounter for antineoplastic chemotherapy: Secondary | ICD-10-CM | POA: Diagnosis not present

## 2019-03-18 DIAGNOSIS — Z171 Estrogen receptor negative status [ER-]: Secondary | ICD-10-CM

## 2019-03-18 LAB — COMPREHENSIVE METABOLIC PANEL
ALT: 65 U/L — ABNORMAL HIGH (ref 0–44)
AST: 57 U/L — ABNORMAL HIGH (ref 15–41)
Albumin: 4.3 g/dL (ref 3.5–5.0)
Alkaline Phosphatase: 75 U/L (ref 38–126)
Anion gap: 14 (ref 5–15)
BUN: 11 mg/dL (ref 6–20)
CO2: 25 mmol/L (ref 22–32)
Calcium: 10 mg/dL (ref 8.9–10.3)
Chloride: 101 mmol/L (ref 98–111)
Creatinine, Ser: 0.78 mg/dL (ref 0.44–1.00)
GFR calc Af Amer: >60 mL/min (ref >60)
GFR calc non Af Amer: >60 mL/min (ref >60)
Glucose, Bld: 116 mg/dL — ABNORMAL HIGH (ref 70–99)
Potassium: 3.2 mmol/L — ABNORMAL LOW (ref 3.5–5.1)
Sodium: 140 mmol/L (ref 135–145)
Total Bilirubin: 0.4 mg/dL (ref 0.3–1.2)
Total Protein: 7.5 g/dL (ref 6.5–8.1)

## 2019-03-18 LAB — CBC WITH DIFFERENTIAL/PLATELET
Abs Immature Granulocytes: 0.29 10*3/uL — ABNORMAL HIGH (ref 0.00–0.07)
Basophils Absolute: 0.1 10*3/uL (ref 0.0–0.1)
Basophils Relative: 1 %
Eosinophils Absolute: 0.1 10*3/uL (ref 0.0–0.5)
Eosinophils Relative: 1 %
HCT: 28 % — ABNORMAL LOW (ref 36.0–46.0)
Hemoglobin: 9.1 g/dL — ABNORMAL LOW (ref 12.0–15.0)
Immature Granulocytes: 6 %
Lymphocytes Relative: 27 %
Lymphs Abs: 1.4 10*3/uL (ref 0.7–4.0)
MCH: 34.7 pg — ABNORMAL HIGH (ref 26.0–34.0)
MCHC: 32.5 g/dL (ref 30.0–36.0)
MCV: 106.9 fL — ABNORMAL HIGH (ref 80.0–100.0)
Monocytes Absolute: 0.7 10*3/uL (ref 0.1–1.0)
Monocytes Relative: 13 %
Neutro Abs: 2.8 10*3/uL (ref 1.7–7.7)
Neutrophils Relative %: 52 %
Platelets: 202 10*3/uL (ref 150–400)
RBC: 2.62 MIL/uL — ABNORMAL LOW (ref 3.87–5.11)
RDW: 16.1 % — ABNORMAL HIGH (ref 11.5–15.5)
WBC: 5.2 10*3/uL (ref 4.0–10.5)
nRBC: 1.9 % — ABNORMAL HIGH (ref 0.0–0.2)

## 2019-03-18 MED ORDER — DEXAMETHASONE SODIUM PHOSPHATE 10 MG/ML IJ SOLN
INTRAMUSCULAR | Status: AC
  Filled 2019-03-18: qty 1

## 2019-03-18 MED ORDER — SODIUM CHLORIDE 0.9 % IV SOLN
Freq: Once | INTRAVENOUS | Status: AC
  Administered 2019-03-18: 09:00:00 via INTRAVENOUS
  Filled 2019-03-18: qty 250

## 2019-03-18 MED ORDER — PALONOSETRON HCL INJECTION 0.25 MG/5ML
INTRAVENOUS | Status: AC
  Filled 2019-03-18: qty 5

## 2019-03-18 MED ORDER — SODIUM CHLORIDE 0.9 % IV SOLN
2000.0000 mg | Freq: Once | INTRAVENOUS | Status: AC
  Administered 2019-03-18: 2000 mg via INTRAVENOUS
  Filled 2019-03-18: qty 52.6

## 2019-03-18 MED ORDER — SODIUM CHLORIDE 0.9 % IV SOLN
250.6000 mg | Freq: Once | INTRAVENOUS | Status: AC
  Administered 2019-03-18: 250 mg via INTRAVENOUS
  Filled 2019-03-18: qty 25

## 2019-03-18 MED ORDER — SODIUM CHLORIDE 0.9% FLUSH
10.0000 mL | Freq: Once | INTRAVENOUS | Status: AC
  Administered 2019-03-18: 10 mL
  Filled 2019-03-18: qty 10

## 2019-03-18 MED ORDER — HEPARIN SOD (PORK) LOCK FLUSH 100 UNIT/ML IV SOLN
500.0000 [IU] | Freq: Once | INTRAVENOUS | Status: AC | PRN
  Administered 2019-03-18: 500 [IU]
  Filled 2019-03-18: qty 5

## 2019-03-18 MED ORDER — SODIUM CHLORIDE 0.9% FLUSH
10.0000 mL | INTRAVENOUS | Status: DC | PRN
  Administered 2019-03-18: 10 mL
  Filled 2019-03-18: qty 10

## 2019-03-18 MED ORDER — PALONOSETRON HCL INJECTION 0.25 MG/5ML
0.2500 mg | Freq: Once | INTRAVENOUS | Status: AC
  Administered 2019-03-18: 0.25 mg via INTRAVENOUS

## 2019-03-18 MED ORDER — DEXAMETHASONE SODIUM PHOSPHATE 10 MG/ML IJ SOLN
10.0000 mg | Freq: Once | INTRAMUSCULAR | Status: AC
  Administered 2019-03-18: 10 mg via INTRAVENOUS

## 2019-03-18 NOTE — Progress Notes (Signed)
Patient re-educated on the importance of taking her po Potassium at home as ordered last month, she has not been taking it and I let her know her lab values today.   Patient declined to start [place] today - " this is my last day of chemo and I am having surgery and discussed with Dr. Jana Hakim waiting until after my surgery to start anything new, I am not up to it today". MD said that was fine.

## 2019-03-18 NOTE — Patient Instructions (Signed)
[facility] Discharge Instructions for Patients Receiving Chemotherapy  Today you received the following chemotherapy agents Gemzar and Carboplatin. To help prevent nausea and vomiting after your treatment, we encourage you to take your nausea medication as directed BUT NO ZOFRAN FOR 3 DAYS POST CHEMO.   If you develop nausea and vomiting that is not controlled by your nausea medication, call the clinic.   BELOW ARE SYMPTOMS THAT SHOULD BE REPORTED IMMEDIATELY:  *FEVER GREATER THAN 100.5 F  *CHILLS WITH OR WITHOUT FEVER  NAUSEA AND VOMITING THAT IS NOT CONTROLLED WITH YOUR NAUSEA MEDICATION  *UNUSUAL SHORTNESS OF BREATH  *UNUSUAL BRUISING OR BLEEDING  TENDERNESS IN MOUTH AND THROAT WITH OR WITHOUT PRESENCE OF ULCERS  *URINARY PROBLEMS  *BOWEL PROBLEMS  UNUSUAL RASH Items with * indicate a potential emergency and should be followed up as soon as possible.  Feel free to call the clinic you have any questions or concerns. The clinic phone number is [phone].  Please show the [place] at check-in to the Emergency Department and triage nurse.

## 2019-03-19 ENCOUNTER — Ambulatory Visit (HOSPITAL_COMMUNITY)
Admission: RE | Admit: 2019-03-19 | Discharge: 2019-03-19 | Disposition: A | Discharge status: Home / Self Care | Payer: 59 | Source: Ambulatory Visit | Attending: Oncology | Admitting: Oncology

## 2019-03-19 DIAGNOSIS — C50912 Malignant neoplasm of unspecified site of left female breast: Secondary | ICD-10-CM | POA: Diagnosis not present

## 2019-03-19 MED ORDER — GADOBUTROL 1 MMOL/ML IV SOLN
10.0000 mL | Freq: Once | INTRAVENOUS | Status: AC | PRN
  Administered 2019-03-19: 9 mL via INTRAVENOUS

## 2019-03-22 ENCOUNTER — Other Ambulatory Visit: Payer: Self-pay | Admitting: Oncology

## 2019-03-24 ENCOUNTER — Other Ambulatory Visit: Payer: Self-pay | Admitting: Oncology

## 2019-04-01 NOTE — H&P (Signed)
Subjective:     Patient ID: Alexandria Bradley is a 58 y.o. female.  HPI  Here for follow up discussion prior to planned breast reconstruction. 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 Underwent lumpectomy, SLN followed by adjuvant radiation and chemotherapy.  Represented 10/2018 with palpable left breast mass. MMG/US sowed mass near lumpectomy scar at 10 o'clock, 7 cmfn, 14 x 12 x 13 mm. No axillary adenopathy. Biopsy with triple negative IDC.  MRI demonstrated known mass left breast UIQ 1.7 cm, LN normal.  Staging scans negative.  Completed neoadjuvant chemotherapy. Final MRI with mass measuring 1.5 cm. Plan mastectomy, desires bilateral. Will receive pembrolizumab adjuvantly.  Genetics 2016 negative.  Prior to lumpectomy B cup, Current wearing same but notes depression in area lumpectomy and does not fill out cup equally, using soft bras. Wt up 25 lb over last 8 months  PMH significant for Prediabetes on metformin, last HbA1c over a year ago.  Worked as reception for a Pharmacist, community prior to [employer], patient does not plan to return to work. May apply for SSI per patient. Lives with husband who is a Insurance underwriter. Son and daughter in area, latter is RN with [employer].  Reports has friend that had delayed DIEP recon, had to lose wt prior to this.     Objective:   Physical Exam  Cardiovascular: Normal rate, regular rhythm and normal heart sounds.  Pulmonary/Chest: Effort normal and breath sounds normal.  Abdominal:  Sufficient soft tissue volume to match current volume breasts  Lymphadenopathy:    She has no axillary adenopathy.   Grade 2 ptosis bilateral SN to nipple R 27 L 26 cm palpable mass at lumpectomy scar site, transverse scar over superior breast BW R 19 L 18 cm CW 13 cm Nipple to IMF R 8 L 8 cm    Assessment:     History triple negative left breast cancer s/p left lumpectomy, SLN History therapeutic  radiation Recurrent left breast cancer Neoadjuvant chemotherapy    Plan:     Plan bilateral mastectomies with bilateral tissue expander reconstruction, left latissimus flap, right acellular dermis.  Given her history radiation, reviewed this will significantly increase her risks of reconstruction including wound healing problems and contracture. Autologous tissue would reduce these risks back to baseline.Plan LD flap with TE on left. I do not anticipate any visible back donor site skin visible as part of reconstruction. Reviewed possible muscle weakness, seroma with LD flaps.  Reviewedprocess of expansion and implant based risks including rupture, MRI surveillance for silicone implants, infection requiring surgery or removal, contracture.   Reviewed SRM vs NSM, both will be asensate and not stimulate. Reviewed with both risks mastectomy flap necrosis requiring additional surgery. Plan skin reduction pattern mastectomies with anchor type scar.  Discussed use of acellular dermis in reconstruction, cadaveric source, incorporation over several weeks, risk that if has seroma or infection can act as additional nidus for infection if not incorporated.Discussed prepectoral vs sub pectoral reconstruction. Discussed with patient and benefit of this is no animation deformity, may be less pain. Risk may be more visible rippling over upper poles, greater need of ADM. Reviewed pre pectoral would require larger amount acellular dermis, more drains.Over right chest plan prepectoral expander placement.  Reviewed post op limitations, drains, hospital stay, medications.Reviewed additional risks including but not limited to seroma, hematoma, asymmetry, fat necrosis, DVT/PE, damage to adjacent structures, cardiopulmonary complications.  Discussed risk COVID infectionthrough this elective surgery. Patient will receive COVID testing  prior to surgery. Discussed even if patient receivesa negative test  result, the tests in some cases may fail to detect the virus or patient maycontract COVID after the test.COVID 19 infectionbefore/during/aftersurgery may result in lead to a higher chance of complication and death.  Irene Limbo, MD [facility] Plastic & Reconstructive Surgery [phone], pin [phone]

## 2019-04-05 ENCOUNTER — Telehealth: Payer: Self-pay | Admitting: Oncology

## 2019-04-05 NOTE — Telephone Encounter (Signed)
Scheduled appt per 6/24 sch message - pt aware of date and time

## 2019-04-05 NOTE — Progress Notes (Signed)
[id number] [address], [place] [address] [place] [address] Phone: [phone] Fax: [phone]      Your procedure is scheduled on July 13th.  Report to [facility] Main Entrance "A" at 5:30 A.M., and check in at the Admitting office.  Call this number if you have problems the morning of surgery:  [phone]  Call [phone] if you have any questions prior to your surgery date Monday-Friday 8am-4pm    Remember:  Do not eat after midnight the night before your surgery  You may drink clear liquids until 4:30 AM the morning of your surgery.   Clear liquids allowed are: Water, Non-Citrus Juices (without pulp), Carbonated Beverages, Clear Tea, Black Coffee Only, and Gatorade    Take these medicines the morning of surgery with A SIP OF WATER   Tylenol - if needed  Diltiazem (Cardizem)  Liothyronine (Cytomel)  Omeprazole (Prilosec)  Compazine - if needed  Propanolol   Tirosint  7 days prior to surgery STOP taking any Aspirin (unless otherwise instructed by your surgeon), Aleve, Naproxen, Ibuprofen, Motrin, Advil, Goody's, BC's, all herbal medications, fish oil, and all vitamins.    The Morning of Surgery  Do not wear jewelry, make-up or nail polish.  Do not wear lotions, powders, or perfumes/colognes, or deodorant  Do not shave 48 hours prior to surgery.   Do not bring valuables to the hospital.  [facility] is not responsible for any belongings or valuables.  If you are a smoker, DO NOT Smoke 24 hours prior to surgery IF you wear a CPAP at night please bring your mask, tubing, and machine the morning of surgery   Remember that you must have someone to transport you home after your surgery, and remain with you for 24 hours if you are discharged the same day.   Contacts, glasses, hearing aids, dentures or bridgework may not be worn into surgery.    Leave your suitcase in the car.  After surgery it may be brought to your  room.  For patients admitted to the hospital, discharge time will be determined by your treatment team.  Patients discharged the day of surgery will not be allowed to drive home.    Special instructions:   [facility]- Preparing For Surgery  Before surgery, you can play an important role. Because skin is not sterile, your skin needs to be as free of germs as possible. You can reduce the number of germs on your skin by washing with CHG (chlorahexidine gluconate) Soap before surgery.  CHG is an antiseptic cleaner which kills germs and bonds with the skin to continue killing germs even after washing.    Oral Hygiene is also important to reduce your risk of infection.  Remember - BRUSH YOUR TEETH THE MORNING OF SURGERY WITH YOUR REGULAR TOOTHPASTE  Please do not use if you have an allergy to CHG or antibacterial soaps. If your skin becomes reddened/irritated stop using the CHG.  Do not shave (including legs and underarms) for at least 48 hours prior to first CHG shower. It is OK to shave your face.  Please follow these instructions carefully.   1. Shower the NIGHT BEFORE SURGERY and the MORNING OF SURGERY with CHG Soap.   2. If you chose to wash your hair, wash your hair first as usual with your normal shampoo.  3. After you shampoo, rinse your hair and body thoroughly to remove the shampoo.  4. Use CHG as you would any other  liquid soap. You can apply CHG directly to the skin and wash gently with a scrungie or a clean washcloth.   5. Apply the CHG Soap to your body ONLY FROM THE NECK DOWN.  Do not use on open wounds or open sores. Avoid contact with your eyes, ears, mouth and genitals (private parts). Wash Face and genitals (private parts)  with your normal soap.   6. Wash thoroughly, paying special attention to the area where your surgery will be performed.  7. Thoroughly rinse your body with warm water from the neck down.  8. DO NOT shower/wash with your normal soap after using and  rinsing off the CHG Soap.  9. Pat yourself dry with a CLEAN TOWEL.  10. Wear CLEAN PAJAMAS to bed the night before surgery, wear comfortable clothes the morning of surgery  11. Place CLEAN SHEETS on your bed the night of your first shower and DO NOT SLEEP WITH PETS.   Day of Surgery:  Do not apply any deodorants/lotions. Please shower the morning of surgery with the CHG soap  Please wear clean clothes to the hospital/surgery center.   Remember to brush your teeth WITH YOUR REGULAR TOOTHPASTE.   Please read over the following fact sheets that you were given.

## 2019-04-06 ENCOUNTER — Other Ambulatory Visit: Payer: Self-pay

## 2019-04-06 ENCOUNTER — Encounter (HOSPITAL_COMMUNITY)
Admission: RE | Admit: 2019-04-06 | Discharge: 2019-04-06 | Disposition: A | Discharge status: Home / Self Care | Payer: 59 | Source: Ambulatory Visit | Attending: General Surgery | Admitting: General Surgery

## 2019-04-06 ENCOUNTER — Encounter (HOSPITAL_COMMUNITY): Payer: Self-pay

## 2019-04-06 DIAGNOSIS — C50912 Malignant neoplasm of unspecified site of left female breast: Secondary | ICD-10-CM | POA: Insufficient documentation

## 2019-04-06 DIAGNOSIS — Z01812 Encounter for preprocedural laboratory examination: Secondary | ICD-10-CM | POA: Insufficient documentation

## 2019-04-06 DIAGNOSIS — Z1159 Encounter for screening for other viral diseases: Secondary | ICD-10-CM | POA: Diagnosis not present

## 2019-04-06 LAB — HEMOGLOBIN A1C
Hgb A1c MFr Bld: 5 % (ref 4.8–5.6)
Mean Plasma Glucose: 96.8 mg/dL

## 2019-04-06 LAB — CBC
HCT: 33.7 % — ABNORMAL LOW (ref 36.0–46.0)
Hemoglobin: 10.5 g/dL — ABNORMAL LOW (ref 12.0–15.0)
MCH: 34.3 pg — ABNORMAL HIGH (ref 26.0–34.0)
MCHC: 31.2 g/dL (ref 30.0–36.0)
MCV: 110.1 fL — ABNORMAL HIGH (ref 80.0–100.0)
Platelets: 340 10*3/uL (ref 150–400)
RBC: 3.06 MIL/uL — ABNORMAL LOW (ref 3.87–5.11)
RDW: 16.5 % — ABNORMAL HIGH (ref 11.5–15.5)
WBC: 4.8 10*3/uL (ref 4.0–10.5)
nRBC: 0 % (ref 0.0–0.2)

## 2019-04-06 LAB — COMPREHENSIVE METABOLIC PANEL
ALT: 44 U/L (ref 0–44)
AST: 63 U/L — ABNORMAL HIGH (ref 15–41)
Albumin: 4.3 g/dL (ref 3.5–5.0)
Alkaline Phosphatase: 73 U/L (ref 38–126)
Anion gap: 13 (ref 5–15)
BUN: 11 mg/dL (ref 6–20)
CO2: 23 mmol/L (ref 22–32)
Calcium: 10.2 mg/dL (ref 8.9–10.3)
Chloride: 103 mmol/L (ref 98–111)
Creatinine, Ser: 0.86 mg/dL (ref 0.44–1.00)
GFR calc Af Amer: >60 mL/min (ref >60)
GFR calc non Af Amer: >60 mL/min (ref >60)
Glucose, Bld: 123 mg/dL — ABNORMAL HIGH (ref 70–99)
Potassium: 3.5 mmol/L (ref 3.5–5.1)
Sodium: 139 mmol/L (ref 135–145)
Total Bilirubin: 1 mg/dL (ref 0.3–1.2)
Total Protein: 7.2 g/dL (ref 6.5–8.1)

## 2019-04-06 NOTE — Progress Notes (Signed)
PCP - Riki Sheer Cardiologist - Dr. Jenkins Rouge  Chest x-ray - N/A EKG - 08-06-18  ECHO - 10-19 Cardiac Cath - 10-19  Anesthesia review: yes, heart history  Patient denies shortness of breath, fever, cough and chest pain at PAT appointment   Patient verbalized understanding of instructions that were given to them at the PAT appointment. Patient was also instructed that they will need to review over the PAT instructions again at home before surgery.

## 2019-04-07 NOTE — Anesthesia Preprocedure Evaluation (Addendum)
Anesthesia Evaluation  Patient identified by MRN, date of birth, ID band Patient awake    Reviewed: Allergy & Precautions, NPO status , Patient's Chart, lab work & pertinent test results  History of Anesthesia Complications (+) PONVNegative for: history of anesthetic complications  Airway Mallampati: II  TM Distance: >3 FB Neck ROM: Full    Dental  (+) Teeth Intact, Dental Advisory Given   Pulmonary sleep apnea , former smoker,    Pulmonary exam normal        Cardiovascular hypertension, Pt. on medications and Pt. on home beta blockers (-) CAD Normal cardiovascular exam     Neuro/Psych negative neurological ROS  negative psych ROS   GI/Hepatic Neg liver ROS, GERD  Medicated,  Endo/Other  Hypothyroidism   Renal/GU negative Renal ROS  negative genitourinary   Musculoskeletal negative musculoskeletal ROS (+)   Abdominal   Peds  Hematology negative hematology ROS (+)   Anesthesia Other Findings Recurrent breast cancer  Echo 07/28/18: EF 55-60%, grade 1 dd, mild to mod RV dysfunction Cath 07/24/18: normal coronary arteries  Reproductive/Obstetrics                           Anesthesia Physical Anesthesia Plan  ASA: II  Anesthesia Plan: General   Post-op Pain Management:    Induction: Intravenous  PONV Risk Score and Plan: 4 or greater and Ondansetron, Dexamethasone, Midazolam, Treatment may vary due to age or medical condition and Scopolamine patch - Pre-op  Airway Management Planned: Oral ETT  Additional Equipment: None  Intra-op Plan:   Post-operative Plan: Extubation in OR  Informed Consent: I have reviewed the patients History and Physical, chart, labs and discussed the procedure including the risks, benefits and alternatives for the proposed anesthesia with the patient or authorized representative who has indicated his/her understanding and acceptance.     Dental advisory  given  Plan Discussed with:   Anesthesia Plan Comments: (PAT note written 04/07/2019 by Myra Gianotti, PA-C. )      Anesthesia Quick Evaluation

## 2019-04-07 NOTE — Progress Notes (Signed)
Anesthesia Chart Review:  Case: [id number] Date/Time: 04/12/19 0715   Procedures:      LEFT MASTECTOMY WITH LEFT AXILLARY SENTINEL LYMPH NODE BIOPSY WITH BLUE DYE INJECTION AND RIGHT RISK REDUCING MASTECTOMY (Bilateral Breast)     BILATERAL BREAST RECONSTRUCTION WITH PLACEMENT OF TISSUE EXPANDER AND ALLODERM TO RIGHT CHEST (Bilateral )     LATISSIMUS FLAP TO BREAST (Left )   Anesthesia type: General   Pre-op diagnosis: recurrent left breast cancer   Location: [facility] OR ROOM 09 / [facility]   Surgeon: [name], MD; Irene Limbo, MD      DISCUSSION: Patient is a 58 year old female scheduled for the above procedures.  History includes former smoker, post-operative N/V, breast cancer (left breast cancer s/p left lumpectomy SLN biopsy 03/01/14, s/p chemoradiation; recurrent left breast cancer 11/10/18, s/p chemotherapy via right IJ PAC, last infusion before surgery 03/18/19), HTN, PVCs, hypercholesterolemia, GERD, pre-diabetes, fatty liver, hypothyroidism, psoriasis. Normal coronaries by 06/2018 [facility]. BMI is consistent with obesity.  Presurgical COVID-19 test is scheduled for 04/08/2019.  If negative and otherwise no acute changes then I anticipate that she can proceed as planned.   VS: BP 127/73   Pulse 79   Temp (!) 36.3 C   Resp 18   Ht 5\' 5"  (1.651 m)   Wt 91.4 kg   SpO2 97%   BMI 33.51 kg/m     PROVIDERS: [name], DO is PCP  Jenkins Rouge, MD is cardiologist. Last visit 08/31/18 with Truitt Merle, NP. Magrinat, Sarajane Jews, MD is HEM-ONC. Last visit 03/11/19.   LABS: Labs reviewed: Acceptable for surgery. AST 63, but similar to previous trends. H/H 10.5/33.7, but stable and slightly improved since 12/2018.  (all labs ordered are listed, but only abnormal results are displayed)  Labs Reviewed  COMPREHENSIVE METABOLIC PANEL - Abnormal; Notable for the following components:      Result Value   Glucose, Bld 123 (*)    AST 63 (*)    All other components within normal  limits  CBC - Abnormal; Notable for the following components:   RBC 3.06 (*)    Hemoglobin 10.5 (*)    HCT 33.7 (*)    MCV 110.1 (*)    MCH 34.3 (*)    RDW 16.5 (*)    All other components within normal limits  HEMOGLOBIN A1C     IMAGES: 1V PCXR 11/18/18: IMPRESSION: 1. Interval placement of right IJ Port-A-Cath without radiographic evidence for complication. 2. Borderline cardiomegaly without failure.   CT Chest 11/12/18: IMPRESSION: 1. Two, 2-3 mm right middle lobe nodules. These most likely benign. A portion of 1 small nodule was suggested on the lung bases from an abdomen and pelvis CT dated 06/17/2014. Metastatic disease is not excluded, but felt unlikely. 2. There are no other findings to suggest metastatic disease from breast carcinoma to the chest, abdomen or pelvis. 3. No acute abnormalities. 4. Changes from previous left lumpectomy with mild left upper lobe anterolateral radiation induce lung scarring. 5. Hepatic steatosis.   EKG: 08/06/18: NSR, minimal voltage criteria for LVH, may be normal variant.  Borderline ECG.   CV: Cardiac event monitor 07/28/18-08/10/18: NSR Isolated PVC's  No significant NSVT Subjective feeling of skips and fluttering does usually  Correlate with PVC's   Echo 07/28/18: Study Conclusions - Left ventricle: The cavity size was normal. Wall thickness was   normal. Systolic function was normal. The estimated ejection   fraction was in the range of 55% to 60%. Wall motion was normal;  there were no regional wall motion abnormalities. Doppler   parameters are consistent with abnormal left ventricular   relaxation (grade 1 diastolic dysfunction). - Right ventricle: Systolic function was mildly to moderately   reduced.  Cardiac cath 07/24/2018: Normal coronary arteries in a dominant RCA system. LVEDP 10 mm Hg. RECOMMENDATION: Suspect noncardiac etiology to the patient's chest tightness.  Consider echo Doppler study to assess  LV function. No indication for antiplatelet therapy at this time.   Past Medical History:  Diagnosis Date  . Anxiety   . Arthritis   . Back pain   . Breast cancer ([place])    left  . Fatty liver   . GERD (gastroesophageal reflux disease)   . Headache(784.0)    PMH : migraines  . History of kidney stones   . Hypercholesterolemia   . Hypertension   . Hypothyroidism   . Kidney stones   . Osteoporosis   . Personal history of chemotherapy   . Personal history of radiation therapy   . PONV (postoperative nausea and vomiting)    difficult to wake up  . Pre-diabetes   . Psoriasis   . PVC's (premature ventricular contractions)   . Radiation 08/18/14-10/07/14   Left breast    Past Surgical History:  Procedure Laterality Date  . BREAST BIOPSY Left 11/10/2018  . BREAST LUMPECTOMY Left 2015  . BREAST LUMPECTOMY WITH AXILLARY LYMPH NODE BIOPSY  6/15   left  . CARDIAC CATHETERIZATION    . CHOLECYSTECTOMY    . COLONOSCOPY N/A 06/27/2014   Procedure: COLONOSCOPY;  Surgeon: Lear Ng, MD;  [facility] ENDOSCOPY;  Service: Endoscopy;  Laterality: N/A;  . LEFT HEART CATH AND CORONARY ANGIOGRAPHY N/A 07/24/2018   Procedure: LEFT HEART CATH AND CORONARY ANGIOGRAPHY;  Surgeon: Troy Sine, MD;  Location: [facility];  Service: Cardiovascular;  Laterality: N/A;  . LIPOMA EXCISION    . MYOMECTOMY    . PORT-A-CATH REMOVAL N/A 02/03/2015   Procedure: REMOVAL PORT-A-CATH;  Surgeon: [name], MD;  Location: [place];  Service: General;  Laterality: N/A;  . PORTACATH PLACEMENT N/A 03/01/2014   Procedure: INSERTION PORT-A-CATH;  Surgeon: [name], MD;  Location: [facility];  Service: General;  Laterality: N/A;  . PORTACATH PLACEMENT N/A 11/18/2018   Procedure: INSERTION PORT-A-CATH WITH ULTRASOUND;  Surgeon: [name], MD;  Location: [facility];  Service: General;  Laterality: N/A;  . TONSILLECTOMY    . TUBAL LIGATION       MEDICATIONS: . acetaminophen (TYLENOL) 500 MG tablet  . calcium carbonate (TUMS - DOSED IN MG ELEMENTAL CALCIUM) 500 MG chewable tablet  . diltiazem (CARDIZEM CD) 240 MG 24 hr capsule  . lidocaine-prilocaine (EMLA) cream  . liothyronine (CYTOMEL) 5 MCG tablet  . omeprazole (PRILOSEC) 20 MG capsule  . ondansetron (ZOFRAN) 8 MG tablet  . potassium chloride (K-DUR) 10 MEQ tablet  . Prenatal Vit-Fe Fumarate-FA (PRENATAL PO)  . prochlorperazine (COMPAZINE) 10 MG tablet  . propranolol (INDERAL) 10 MG tablet  . TIROSINT 100 MCG CAPS  . triamterene-hydrochlorothiazide (MAXZIDE-25) 37.5-25 MG tablet   No current facility-administered medications for this encounter.     Myra Gianotti, PA-C Surgical Short Stay/Anesthesiology [facility] Phone [phone] [facility] Phone [phone] 04/07/2019 9:45 AM

## 2019-04-08 ENCOUNTER — Other Ambulatory Visit (HOSPITAL_COMMUNITY)
Admission: RE | Admit: 2019-04-08 | Discharge: 2019-04-08 | Disposition: A | Discharge status: Home / Self Care | Payer: 59 | Source: Ambulatory Visit | Attending: General Surgery | Admitting: General Surgery

## 2019-04-08 DIAGNOSIS — Z01812 Encounter for preprocedural laboratory examination: Secondary | ICD-10-CM | POA: Diagnosis not present

## 2019-04-09 LAB — SARS CORONAVIRUS 2 (TAT 6-24 HRS): SARS Coronavirus 2: NEGATIVE

## 2019-04-12 ENCOUNTER — Observation Stay (HOSPITAL_COMMUNITY)
Admission: RE | Admit: 2019-04-12 | Discharge: 2019-04-12 | Disposition: A | Discharge status: Home / Self Care | Payer: No Typology Code available for payment source | Source: Ambulatory Visit | Attending: General Surgery | Admitting: General Surgery

## 2019-04-12 ENCOUNTER — Encounter (HOSPITAL_COMMUNITY)
Admission: RE | Disposition: A | Discharge status: Home / Self Care | Payer: Self-pay | Source: Home / Self Care | Attending: Plastic Surgery

## 2019-04-12 ENCOUNTER — Ambulatory Visit (HOSPITAL_COMMUNITY): Payer: No Typology Code available for payment source | Admitting: Anesthesiology

## 2019-04-12 ENCOUNTER — Ambulatory Visit (HOSPITAL_COMMUNITY): Payer: No Typology Code available for payment source | Admitting: Vascular Surgery

## 2019-04-12 ENCOUNTER — Observation Stay (HOSPITAL_COMMUNITY)
Admission: RE | Admit: 2019-04-12 | Discharge: 2019-04-13 | Disposition: A | Discharge status: Home / Self Care | Payer: No Typology Code available for payment source | Attending: Plastic Surgery | Admitting: Plastic Surgery

## 2019-04-12 ENCOUNTER — Other Ambulatory Visit: Payer: Self-pay

## 2019-04-12 ENCOUNTER — Encounter (HOSPITAL_COMMUNITY): Payer: Self-pay

## 2019-04-12 DIAGNOSIS — Z87891 Personal history of nicotine dependence: Secondary | ICD-10-CM | POA: Diagnosis not present

## 2019-04-12 DIAGNOSIS — C50212 Malignant neoplasm of upper-inner quadrant of left female breast: Secondary | ICD-10-CM | POA: Diagnosis not present

## 2019-04-12 DIAGNOSIS — C50912 Malignant neoplasm of unspecified site of left female breast: Secondary | ICD-10-CM | POA: Diagnosis present

## 2019-04-12 DIAGNOSIS — Z923 Personal history of irradiation: Secondary | ICD-10-CM | POA: Diagnosis not present

## 2019-04-12 DIAGNOSIS — Z79890 Hormone replacement therapy (postmenopausal): Secondary | ICD-10-CM | POA: Insufficient documentation

## 2019-04-12 DIAGNOSIS — Z853 Personal history of malignant neoplasm of breast: Secondary | ICD-10-CM | POA: Insufficient documentation

## 2019-04-12 DIAGNOSIS — Z171 Estrogen receptor negative status [ER-]: Secondary | ICD-10-CM | POA: Insufficient documentation

## 2019-04-12 DIAGNOSIS — Z79899 Other long term (current) drug therapy: Secondary | ICD-10-CM | POA: Diagnosis not present

## 2019-04-12 DIAGNOSIS — Z9221 Personal history of antineoplastic chemotherapy: Secondary | ICD-10-CM | POA: Diagnosis not present

## 2019-04-12 DIAGNOSIS — R7303 Prediabetes: Secondary | ICD-10-CM | POA: Insufficient documentation

## 2019-04-12 DIAGNOSIS — I10 Essential (primary) hypertension: Secondary | ICD-10-CM | POA: Insufficient documentation

## 2019-04-12 DIAGNOSIS — E039 Hypothyroidism, unspecified: Secondary | ICD-10-CM | POA: Insufficient documentation

## 2019-04-12 DIAGNOSIS — K219 Gastro-esophageal reflux disease without esophagitis: Secondary | ICD-10-CM | POA: Diagnosis not present

## 2019-04-12 DIAGNOSIS — G4730 Sleep apnea, unspecified: Secondary | ICD-10-CM | POA: Diagnosis not present

## 2019-04-12 DIAGNOSIS — Z7984 Long term (current) use of oral hypoglycemic drugs: Secondary | ICD-10-CM | POA: Diagnosis not present

## 2019-04-12 HISTORY — PX: LATISSIMUS FLAP TO BREAST: SHX5357

## 2019-04-12 HISTORY — PX: BREAST RECONSTRUCTION WITH PLACEMENT OF TISSUE EXPANDER AND ALLODERM: SHX6805

## 2019-04-12 HISTORY — PX: MASTECTOMY W/ SENTINEL NODE BIOPSY: SHX2001

## 2019-04-12 LAB — GLUCOSE, CAPILLARY: Glucose-Capillary: 147 mg/dL — ABNORMAL HIGH (ref 70–99)

## 2019-04-12 SURGERY — MASTECTOMY WITH SENTINEL LYMPH NODE BIOPSY
Anesthesia: General | Site: Breast | Laterality: Left

## 2019-04-12 MED ORDER — FENTANYL CITRATE (PF) 100 MCG/2ML IJ SOLN: 25.0000 ug | INTRAMUSCULAR | Status: DC | PRN

## 2019-04-12 MED ORDER — 0.9 % SODIUM CHLORIDE (POUR BTL) OPTIME
TOPICAL | Status: DC | PRN
  Administered 2019-04-12 (×2): 1000 mL

## 2019-04-12 MED ORDER — ACETAMINOPHEN 500 MG PO TABS
ORAL_TABLET | ORAL | Status: AC
  Administered 2019-04-12: 500 mg via ORAL
  Filled 2019-04-12: qty 2

## 2019-04-12 MED ORDER — ROCURONIUM BROMIDE 10 MG/ML (PF) SYRINGE
PREFILLED_SYRINGE | INTRAVENOUS | Status: AC
  Filled 2019-04-12: qty 10

## 2019-04-12 MED ORDER — DILTIAZEM HCL ER COATED BEADS 240 MG PO CP24
240.0000 mg | ORAL_CAPSULE | Freq: Every day | ORAL | Status: DC
  Administered 2019-04-13: 240 mg via ORAL
  Filled 2019-04-12: qty 1

## 2019-04-12 MED ORDER — HEPARIN SODIUM (PORCINE) 5000 UNIT/ML IJ SOLN
5000.0000 [IU] | Freq: Three times a day (TID) | INTRAMUSCULAR | Status: DC
  Administered 2019-04-12 – 2019-04-13 (×4): 5000 [IU] via SUBCUTANEOUS
  Filled 2019-04-12 (×4): qty 1

## 2019-04-12 MED ORDER — PHENYLEPHRINE 40 MCG/ML (10ML) SYRINGE FOR IV PUSH (FOR BLOOD PRESSURE SUPPORT)
PREFILLED_SYRINGE | INTRAVENOUS | Status: AC
  Filled 2019-04-12: qty 10

## 2019-04-12 MED ORDER — GABAPENTIN 300 MG PO CAPS
ORAL_CAPSULE | ORAL | Status: AC
  Administered 2019-04-12: 300 mg via ORAL
  Filled 2019-04-12: qty 1

## 2019-04-12 MED ORDER — CEFAZOLIN SODIUM-DEXTROSE 2-4 GM/100ML-% IV SOLN
2.0000 g | INTRAVENOUS | Status: AC
  Administered 2019-04-12 (×2): 2 g via INTRAVENOUS
  Filled 2019-04-12: qty 100

## 2019-04-12 MED ORDER — PANTOPRAZOLE SODIUM 40 MG PO TBEC
40.0000 mg | DELAYED_RELEASE_TABLET | Freq: Every day | ORAL | Status: DC
  Administered 2019-04-12 – 2019-04-13 (×2): 40 mg via ORAL
  Filled 2019-04-12 (×2): qty 1

## 2019-04-12 MED ORDER — BUPIVACAINE HCL (PF) 0.25 % IJ SOLN
INTRAMUSCULAR | Status: DC | PRN
  Administered 2019-04-12 (×2): 30 mL via EPIDURAL

## 2019-04-12 MED ORDER — PHENYLEPHRINE 40 MCG/ML (10ML) SYRINGE FOR IV PUSH (FOR BLOOD PRESSURE SUPPORT)
PREFILLED_SYRINGE | INTRAVENOUS | Status: DC | PRN
  Administered 2019-04-12 (×2): 40 ug via INTRAVENOUS

## 2019-04-12 MED ORDER — KETOROLAC TROMETHAMINE 15 MG/ML IJ SOLN: 15.0000 mg | INTRAMUSCULAR | Status: DC

## 2019-04-12 MED ORDER — SIMETHICONE 80 MG PO CHEW: 40.0000 mg | CHEWABLE_TABLET | Freq: Four times a day (QID) | ORAL | Status: DC | PRN

## 2019-04-12 MED ORDER — ONDANSETRON HCL 4 MG/2ML IJ SOLN
4.0000 mg | Freq: Four times a day (QID) | INTRAMUSCULAR | Status: DC | PRN
  Filled 2019-04-12: qty 2

## 2019-04-12 MED ORDER — POLYETHYLENE GLYCOL 3350 17 G PO PACK: 17.0000 g | PACK | Freq: Every day | ORAL | Status: DC | PRN

## 2019-04-12 MED ORDER — CEFAZOLIN SODIUM-DEXTROSE 2-4 GM/100ML-% IV SOLN
2.0000 g | Freq: Three times a day (TID) | INTRAVENOUS | Status: AC
  Administered 2019-04-12 – 2019-04-13 (×2): 2 g via INTRAVENOUS
  Filled 2019-04-12 (×2): qty 100

## 2019-04-12 MED ORDER — BUPIVACAINE HCL (PF) 0.25 % IJ SOLN
INTRAMUSCULAR | Status: AC
  Filled 2019-04-12: qty 30

## 2019-04-12 MED ORDER — FENTANYL CITRATE (PF) 100 MCG/2ML IJ SOLN
INTRAMUSCULAR | Status: DC | PRN
  Administered 2019-04-12: 25 ug via INTRAVENOUS
  Administered 2019-04-12 (×7): 50 ug via INTRAVENOUS

## 2019-04-12 MED ORDER — LEVOTHYROXINE SODIUM 100 MCG PO TABS
100.0000 ug | ORAL_TABLET | Freq: Every day | ORAL | Status: DC
  Administered 2019-04-13: 05:00:00 100 ug via ORAL
  Filled 2019-04-12: qty 1

## 2019-04-12 MED ORDER — KETOROLAC TROMETHAMINE 15 MG/ML IJ SOLN
15.0000 mg | Freq: Four times a day (QID) | INTRAMUSCULAR | Status: DC | PRN
  Administered 2019-04-13: 15 mg via INTRAVENOUS
  Filled 2019-04-12: qty 1

## 2019-04-12 MED ORDER — MIDAZOLAM HCL 2 MG/2ML IJ SOLN
INTRAMUSCULAR | Status: DC | PRN
  Administered 2019-04-12: 2 mg via INTRAVENOUS

## 2019-04-12 MED ORDER — SODIUM CHLORIDE 0.9 % IV SOLN
INTRAVENOUS | Status: DC
  Filled 2019-04-12: qty 1

## 2019-04-12 MED ORDER — ACETAMINOPHEN 500 MG PO TABS
1000.0000 mg | ORAL_TABLET | Freq: Four times a day (QID) | ORAL | Status: DC
  Administered 2019-04-12 – 2019-04-13 (×3): 1000 mg via ORAL
  Filled 2019-04-12 (×3): qty 2

## 2019-04-12 MED ORDER — PROPOFOL 10 MG/ML IV BOLUS
INTRAVENOUS | Status: AC
  Filled 2019-04-12: qty 40

## 2019-04-12 MED ORDER — SODIUM CHLORIDE (PF) 0.9 % IJ SOLN
INTRAVENOUS | Status: DC | PRN
  Administered 2019-04-12: 5 mL

## 2019-04-12 MED ORDER — SUCCINYLCHOLINE CHLORIDE 200 MG/10ML IV SOSY
PREFILLED_SYRINGE | INTRAVENOUS | Status: DC | PRN
  Administered 2019-04-12: 140 mg via INTRAVENOUS

## 2019-04-12 MED ORDER — FENTANYL CITRATE (PF) 250 MCG/5ML IJ SOLN
INTRAMUSCULAR | Status: AC
  Filled 2019-04-12: qty 5

## 2019-04-12 MED ORDER — DEXAMETHASONE SODIUM PHOSPHATE 10 MG/ML IJ SOLN
INTRAMUSCULAR | Status: AC
  Filled 2019-04-12: qty 1

## 2019-04-12 MED ORDER — ROCURONIUM BROMIDE 50 MG/5ML IV SOSY
PREFILLED_SYRINGE | INTRAVENOUS | Status: DC | PRN
  Administered 2019-04-12: 20 mg via INTRAVENOUS
  Administered 2019-04-12: 40 mg via INTRAVENOUS

## 2019-04-12 MED ORDER — SODIUM CHLORIDE (PF) 0.9 % IJ SOLN
INTRAMUSCULAR | Status: AC
  Filled 2019-04-12: qty 10

## 2019-04-12 MED ORDER — SCOPOLAMINE 1 MG/3DAYS TD PT72
MEDICATED_PATCH | TRANSDERMAL | Status: DC | PRN
  Administered 2019-04-12: 1 via TRANSDERMAL

## 2019-04-12 MED ORDER — OXYCODONE HCL 5 MG PO TABS
5.0000 mg | ORAL_TABLET | ORAL | Status: DC | PRN
  Administered 2019-04-12: 20:00:00 5 mg via ORAL
  Filled 2019-04-12: qty 1

## 2019-04-12 MED ORDER — ONDANSETRON 4 MG PO TBDP: 4.0000 mg | ORAL_TABLET | Freq: Four times a day (QID) | ORAL | Status: DC | PRN

## 2019-04-12 MED ORDER — BISACODYL 10 MG RE SUPP: 10.0000 mg | Freq: Every day | RECTAL | Status: DC | PRN

## 2019-04-12 MED ORDER — CEFAZOLIN SODIUM-DEXTROSE 2-4 GM/100ML-% IV SOLN
2.0000 g | Freq: Three times a day (TID) | INTRAVENOUS | Status: DC
  Filled 2019-04-12 (×2): qty 100

## 2019-04-12 MED ORDER — POTASSIUM CHLORIDE ER 10 MEQ PO TBCR
10.0000 meq | EXTENDED_RELEASE_TABLET | Freq: Every day | ORAL | Status: DC
  Administered 2019-04-13: 10:00:00 10 meq via ORAL
  Filled 2019-04-12 (×2): qty 1

## 2019-04-12 MED ORDER — CALCIUM CARBONATE ANTACID 500 MG PO CHEW: 1.0000 | CHEWABLE_TABLET | Freq: Every evening | ORAL | Status: DC | PRN

## 2019-04-12 MED ORDER — LIOTHYRONINE SODIUM 5 MCG PO TABS
5.0000 ug | ORAL_TABLET | Freq: Every day | ORAL | Status: DC
  Administered 2019-04-13: 08:00:00 5 ug via ORAL
  Filled 2019-04-12 (×2): qty 1

## 2019-04-12 MED ORDER — TECHNETIUM TC 99M SULFUR COLLOID FILTERED: 1.0000 | Freq: Once | INTRAVENOUS | Status: DC | PRN

## 2019-04-12 MED ORDER — HYDRALAZINE HCL 20 MG/ML IJ SOLN: 10.0000 mg | INTRAMUSCULAR | Status: DC | PRN

## 2019-04-12 MED ORDER — SUGAMMADEX SODIUM 200 MG/2ML IV SOLN
INTRAVENOUS | Status: DC | PRN
  Administered 2019-04-12: 200 mg via INTRAVENOUS

## 2019-04-12 MED ORDER — SODIUM CHLORIDE 0.9 % IV SOLN
INTRAVENOUS | Status: DC
  Administered 2019-04-12: 15:00:00 via INTRAVENOUS

## 2019-04-12 MED ORDER — LACTATED RINGERS IV SOLN
INTRAVENOUS | Status: DC | PRN
  Administered 2019-04-12 (×3): via INTRAVENOUS

## 2019-04-12 MED ORDER — OXYCODONE HCL 5 MG/5ML PO SOLN: 5.0000 mg | Freq: Once | ORAL | Status: DC | PRN

## 2019-04-12 MED ORDER — OXYCODONE HCL 5 MG PO TABS: 5.0000 mg | ORAL_TABLET | Freq: Once | ORAL | Status: DC | PRN

## 2019-04-12 MED ORDER — ACETAMINOPHEN 500 MG PO TABS
1000.0000 mg | ORAL_TABLET | ORAL | Status: AC
  Administered 2019-04-12: 06:00:00 500 mg via ORAL

## 2019-04-12 MED ORDER — PROPOFOL 10 MG/ML IV BOLUS
INTRAVENOUS | Status: DC | PRN
  Administered 2019-04-12: 40 mg via INTRAVENOUS
  Administered 2019-04-12: 70 mg via INTRAVENOUS
  Administered 2019-04-12: 60 mg via INTRAVENOUS
  Administered 2019-04-12: 150 mg via INTRAVENOUS
  Administered 2019-04-12: 50 mg via INTRAVENOUS

## 2019-04-12 MED ORDER — SCOPOLAMINE 1 MG/3DAYS TD PT72
MEDICATED_PATCH | TRANSDERMAL | Status: AC
  Filled 2019-04-12: qty 1

## 2019-04-12 MED ORDER — SUCCINYLCHOLINE CHLORIDE 200 MG/10ML IV SOSY
PREFILLED_SYRINGE | INTRAVENOUS | Status: AC
  Filled 2019-04-12: qty 10

## 2019-04-12 MED ORDER — ONDANSETRON HCL 4 MG/2ML IJ SOLN
INTRAMUSCULAR | Status: DC | PRN
  Administered 2019-04-12 (×2): 4 mg via INTRAVENOUS

## 2019-04-12 MED ORDER — PROMETHAZINE HCL 25 MG/ML IJ SOLN: 6.2500 mg | INTRAMUSCULAR | Status: DC | PRN

## 2019-04-12 MED ORDER — HEPARIN SODIUM (PORCINE) 5000 UNIT/ML IJ SOLN
5000.0000 [IU] | INTRAMUSCULAR | Status: AC
  Administered 2019-04-12: 06:00:00 5000 [IU] via SUBCUTANEOUS

## 2019-04-12 MED ORDER — ONDANSETRON HCL 4 MG/2ML IJ SOLN
INTRAMUSCULAR | Status: AC
  Filled 2019-04-12: qty 2

## 2019-04-12 MED ORDER — BUPIVACAINE HCL 0.25 % IJ SOLN
INTRAMUSCULAR | Status: DC | PRN
  Administered 2019-04-12: 20 mL

## 2019-04-12 MED ORDER — PROPRANOLOL HCL 20 MG PO TABS: 10.0000 mg | ORAL_TABLET | Freq: Every day | ORAL | Status: DC | PRN

## 2019-04-12 MED ORDER — MIDAZOLAM HCL 2 MG/2ML IJ SOLN
INTRAMUSCULAR | Status: AC
  Filled 2019-04-12: qty 2

## 2019-04-12 MED ORDER — DEXAMETHASONE SODIUM PHOSPHATE 10 MG/ML IJ SOLN
INTRAMUSCULAR | Status: DC | PRN
  Administered 2019-04-12: 10 mg via INTRAVENOUS

## 2019-04-12 MED ORDER — SODIUM CHLORIDE 0.9 % IV SOLN
INTRAVENOUS | Status: DC | PRN
  Administered 2019-04-12: 1000 mL

## 2019-04-12 MED ORDER — CEFAZOLIN SODIUM 1 G IJ SOLR
INTRAMUSCULAR | Status: AC
  Filled 2019-04-12: qty 20

## 2019-04-12 MED ORDER — TRIAMTERENE-HCTZ 37.5-25 MG PO TABS
1.0000 | ORAL_TABLET | Freq: Every day | ORAL | Status: DC
  Administered 2019-04-13: 10:00:00 1 via ORAL
  Filled 2019-04-12: qty 1

## 2019-04-12 MED ORDER — METHOCARBAMOL 500 MG PO TABS: 500.0000 mg | ORAL_TABLET | Freq: Four times a day (QID) | ORAL | Status: DC | PRN

## 2019-04-12 MED ORDER — MORPHINE SULFATE (PF) 2 MG/ML IV SOLN: 2.0000 mg | INTRAVENOUS | Status: DC | PRN

## 2019-04-12 MED ORDER — KETOROLAC TROMETHAMINE 15 MG/ML IJ SOLN
INTRAMUSCULAR | Status: AC
  Filled 2019-04-12: qty 1

## 2019-04-12 MED ORDER — METHYLENE BLUE 0.5 % INJ SOLN
INTRAVENOUS | Status: AC
  Filled 2019-04-12: qty 10

## 2019-04-12 MED ORDER — GABAPENTIN 300 MG PO CAPS
300.0000 mg | ORAL_CAPSULE | ORAL | Status: AC
  Administered 2019-04-12: 06:00:00 300 mg via ORAL

## 2019-04-12 MED ORDER — LIDOCAINE 2% (20 MG/ML) 5 ML SYRINGE
INTRAMUSCULAR | Status: DC | PRN
  Administered 2019-04-12: 60 mg via INTRAVENOUS

## 2019-04-12 MED ORDER — LIDOCAINE 2% (20 MG/ML) 5 ML SYRINGE
INTRAMUSCULAR | Status: AC
  Filled 2019-04-12: qty 5

## 2019-04-12 MED ORDER — OXYCODONE HCL 5 MG PO TABS
ORAL_TABLET | ORAL | Status: AC
  Administered 2019-04-12: 14:00:00 5 mg
  Filled 2019-04-12: qty 1

## 2019-04-12 MED ORDER — HEPARIN SODIUM (PORCINE) 5000 UNIT/ML IJ SOLN
INTRAMUSCULAR | Status: AC
  Administered 2019-04-12: 06:00:00 5000 [IU] via SUBCUTANEOUS
  Filled 2019-04-12: qty 1

## 2019-04-12 SURGICAL SUPPLY — 91 items
ADH SKN CLS APL DERMABOND .7 (GAUZE/BANDAGES/DRESSINGS) ×2
ALLODERM 16X20 PERFORATED (Tissue) ×1 IMPLANT
ALLOGRAFT PERF 16X20 1.6+/-0.4 (Tissue) ×1 IMPLANT
APPLIER CLIP 9.375 MED OPEN (MISCELLANEOUS) ×4
APR CLP MED 9.3 20 MLT OPN (MISCELLANEOUS) ×2
BAG DECANTER FOR FLEXI CONT (MISCELLANEOUS) ×4 IMPLANT
BINDER BREAST XXLRG (GAUZE/BANDAGES/DRESSINGS) ×2 IMPLANT
BLADE SURG 10 STRL SS (BLADE) ×4 IMPLANT
BLADE SURG 15 STRL LF DISP TIS (BLADE) IMPLANT
BLADE SURG 15 STRL SS (BLADE) ×4
BNDG COHESIVE 4X5 TAN STRL (GAUZE/BANDAGES/DRESSINGS) ×2 IMPLANT
CANISTER SUCT 3000ML PPV (MISCELLANEOUS) ×8 IMPLANT
CLIP APPLIE 9.375 MED OPEN (MISCELLANEOUS) IMPLANT
CONT SPEC 4OZ CLIKSEAL STRL BL (MISCELLANEOUS) ×4 IMPLANT
COVER MAYO STAND STRL (DRAPES) ×2 IMPLANT
COVER PROBE W GEL 5X96 (DRAPES) ×4 IMPLANT
COVER SURGICAL LIGHT HANDLE (MISCELLANEOUS) ×10 IMPLANT
DERMABOND ADVANCED (GAUZE/BANDAGES/DRESSINGS) ×2
DERMABOND ADVANCED .7 DNX12 (GAUZE/BANDAGES/DRESSINGS) ×2 IMPLANT
DRAIN CHANNEL 15F RND FF W/TCR (WOUND CARE) ×4 IMPLANT
DRAIN CHANNEL 19F RND (DRAIN) ×6 IMPLANT
DRAPE HALF SHEET 40X57 (DRAPES) ×12 IMPLANT
DRAPE INCISE IOBAN 85X60 (DRAPES) ×4 IMPLANT
DRAPE ORTHO SPLIT 77X108 STRL (DRAPES) ×16
DRAPE SURG ORHT 6 SPLT 77X108 (DRAPES) ×8 IMPLANT
DRAPE UTILITY XL STRL (DRAPES) ×4 IMPLANT
DRAPE WARM FLUID 44X44 (DRAPES) ×4 IMPLANT
DRSG TEGADERM 4X4.75 (GAUZE/BANDAGES/DRESSINGS) ×10 IMPLANT
DURAPREP 26ML APPLICATOR (WOUND CARE) ×8 IMPLANT
ELECT BLADE 4.0 EZ CLEAN MEGAD (MISCELLANEOUS) ×8
ELECT CAUTERY BLADE 6.4 (BLADE) ×4 IMPLANT
ELECT REM PT RETURN 9FT ADLT (ELECTROSURGICAL) ×8
ELECTRODE BLDE 4.0 EZ CLN MEGD (MISCELLANEOUS) ×4 IMPLANT
ELECTRODE REM PT RTRN 9FT ADLT (ELECTROSURGICAL) ×2 IMPLANT
EVACUATOR SILICONE 100CC (DRAIN) ×10 IMPLANT
EXPANDER TISSUE FV FOURTE 400 (Prosthesis & Implant Plastic) IMPLANT
EXPANDER TISSUE MX FOURTE 400 (Prosthesis & Implant Plastic) IMPLANT
GLOVE BIO SURGEON STRL SZ 6 (GLOVE) ×14 IMPLANT
GLOVE BIO SURGEON STRL SZ7 (GLOVE) ×6 IMPLANT
GLOVE BIOGEL PI IND STRL 7.5 (GLOVE) ×2 IMPLANT
GLOVE BIOGEL PI INDICATOR 7.5 (GLOVE) ×2
GLOVE SURG SS PI 6.5 STRL IVOR (GLOVE) ×4 IMPLANT
GOWN STRL REUS W/ TWL LRG LVL3 (GOWN DISPOSABLE) ×6 IMPLANT
GOWN STRL REUS W/TWL LRG LVL3 (GOWN DISPOSABLE) ×24
KIT BASIN OR (CUSTOM PROCEDURE TRAY) ×4 IMPLANT
KIT TURNOVER KIT B (KITS) ×4 IMPLANT
LIGHT WAVEGUIDE WIDE FLAT (MISCELLANEOUS) ×2 IMPLANT
MARKER SKIN DUAL TIP RULER LAB (MISCELLANEOUS) ×6 IMPLANT
NDL 18GX1X1/2 (RX/OR ONLY) (NEEDLE) IMPLANT
NDL FILTER BLUNT 18X1 1/2 (NEEDLE) IMPLANT
NDL HYPO 25GX1X1/2 BEV (NEEDLE) IMPLANT
NEEDLE 18GX1X1/2 (RX/OR ONLY) (NEEDLE) ×4 IMPLANT
NEEDLE FILTER BLUNT 18X 1/2SAF (NEEDLE) ×2
NEEDLE FILTER BLUNT 18X1 1/2 (NEEDLE) ×2 IMPLANT
NEEDLE HYPO 25GX1X1/2 BEV (NEEDLE) ×8 IMPLANT
NS IRRIG 1000ML POUR BTL (IV SOLUTION) ×6 IMPLANT
PACK GENERAL/GYN (CUSTOM PROCEDURE TRAY) ×4 IMPLANT
PAD ABD 8X10 STRL (GAUZE/BANDAGES/DRESSINGS) ×2 IMPLANT
PAD ARMBOARD 7.5X6 YLW CONV (MISCELLANEOUS) ×16 IMPLANT
PENCIL SMOKE EVACUATOR (MISCELLANEOUS) ×4 IMPLANT
PIN SAFETY STERILE (MISCELLANEOUS) ×4 IMPLANT
PUNCH BIOPSY 4MM (MISCELLANEOUS) ×4
PUNCH BIOPSY DISP 4 (MISCELLANEOUS) IMPLANT
SET ASEPTIC TRANSFER (MISCELLANEOUS) ×4 IMPLANT
SET COLLECT BLD 21X3/4 12 PB (MISCELLANEOUS) ×2 IMPLANT
SOL PREP POV-IOD 4OZ 10% (MISCELLANEOUS) ×4 IMPLANT
SPECIMEN JAR MEDIUM (MISCELLANEOUS) ×4 IMPLANT
SPONGE LAP 18X18 RF (DISPOSABLE) ×10 IMPLANT
STAPLER VISISTAT 35W (STAPLE) ×4 IMPLANT
STOCKINETTE IMPERVIOUS 9X36 MD (GAUZE/BANDAGES/DRESSINGS) ×2 IMPLANT
SUT CHROMIC 4 0 PS 2 18 (SUTURE) ×8 IMPLANT
SUT ETHILON 2 0 FS 18 (SUTURE) ×4 IMPLANT
SUT MNCRL AB 4-0 PS2 18 (SUTURE) ×6 IMPLANT
SUT PDS AB 2-0 CT2 27 (SUTURE) ×6 IMPLANT
SUT SILK 2 0 SH (SUTURE) ×2 IMPLANT
SUT VIC AB 0 CT2 27 (SUTURE) ×4 IMPLANT
SUT VIC AB 3-0 SH 27 (SUTURE) ×8
SUT VIC AB 3-0 SH 27X BRD (SUTURE) IMPLANT
SUT VIC AB 3-0 SH 8-18 (SUTURE) ×6 IMPLANT
SUT VIC AB 4-0 PS2 18 (SUTURE) ×2 IMPLANT
SUT VLOC 180 0 24IN GS25 (SUTURE) ×4 IMPLANT
SYR 50ML SLIP (SYRINGE) ×2 IMPLANT
SYR BULB IRRIGATION 50ML (SYRINGE) ×4 IMPLANT
SYR CONTROL 10ML LL (SYRINGE) ×4 IMPLANT
TISSUE EXPNDR FV FOURTE 400 (Prosthesis & Implant Plastic) ×4 IMPLANT
TISSUE EXPNDR MX FOURTE 400 (Prosthesis & Implant Plastic) ×4 IMPLANT
TOWEL GREEN STERILE (TOWEL DISPOSABLE) ×8 IMPLANT
TOWEL GREEN STERILE FF (TOWEL DISPOSABLE) ×8 IMPLANT
TRAY FOLEY MTR SLVR 14FR STAT (SET/KITS/TRAYS/PACK) ×4 IMPLANT
TUBE CONNECTING 12'X1/4 (SUCTIONS) ×1
TUBE CONNECTING 12X1/4 (SUCTIONS) ×3 IMPLANT

## 2019-04-12 NOTE — Anesthesia Procedure Notes (Signed)
Procedure Name: Intubation Date/Time: 04/12/2019 7:42 AM Performed by: Genelle Bal, CRNA Pre-anesthesia Checklist: Patient identified, Emergency Drugs available, Suction available and Patient being monitored Patient Re-evaluated:Patient Re-evaluated prior to induction Oxygen Delivery Method: Circle system utilized Preoxygenation: Pre-oxygenation with 100% oxygen Induction Type: IV induction Ventilation: Mask ventilation without difficulty Laryngoscope Size: Miller and 2 Grade View: Grade II Tube type: Oral Tube size: 7.0 mm Number of attempts: 1 Airway Equipment and Method: Stylet and Oral airway Placement Confirmation: ETT inserted through vocal cords under direct vision,  positive ETCO2 and breath sounds checked- equal and bilateral Secured at: 23 cm Tube secured with: Tape Dental Injury: Teeth and Oropharynx as per pre-operative assessment

## 2019-04-12 NOTE — Op Note (Addendum)
Operative Note   DATE OF OPERATION: 7.13.20  LOCATION: [facility]  SURGICAL DIVISION: Plastic Surgery  PREOPERATIVE DIAGNOSES:  1. Left breast cancer recurrent ER- 2. History therapeutic radiataion 3. Neoadjuvant chemotherapy  POSTOPERATIVE DIAGNOSES:  same  PROCEDURE:  1. Bilateral breast reconstruction with tissue expanders 2.Acellular dermis for breast reconstruction right chest (Alloderm 300 cm2) 3. Left latissimus dorsi flap to left chest for breast reconstruction  SURGEON: Irene Limbo MD MBA  ASSISTANT: P. Anderson RNFA   ANESTHESIA:  General.   EBL: 150 ml for entire procedure  COMPLICATIONS: None immediate.   INDICATIONS FOR PROCEDURE:  The patient, Alexandria Bradley, is a 58 y.o. female born on 1961/08/21, is here for immediate breast reconstruction bilateral following skin sparing mastectomies. She has a history prior left lumpectomy with adjuvant radiation. Plan left latissimus dorsi flap as part of reconstruction.   FINDINGS: RIGHT Natrelle 133S-FV-12-T 400 ml tissue expander placed, initial fill volume 300 ml air, [id number]. LEFT Natrelle 133S-MX-12-T 400 ml tissue expander placed, initial fill volume 60 ml saline. SN [id number]  DESCRIPTION OF PROCEDURE:  The patient was marked with the patient in the preoperative area to mark sternal notch, chest midline, anterior axillary lines and inframammary folds. Patient was marked for skin reduction mastectomy with most superior portion nipple areola marked on breast meridian over right breast. Vertical limbs marked by breast displacement and set at8cm length. Over left breast, patient had transverse upper quadrant scar over left breast. The palpable mass was adjacent to this scar. Curvilinear excision NAC with prior lumpectomy scar designed. The patient was taken to the operating room. SCDs were placed and IV antibiotics were given. Foley catheter placed. The patient's operative site was prepped and draped in a  sterile fashion. A time out was performed and all information was confirmed to be correct. In supine position, the lateral limbs for resection marked over right breast and area over lower pole preserved as inferiorly based dermal pedicle. Skin de epithelialized in this area. Following completion of mastectomies, reconstruction began onrightside.  The cavity was irrigated with solution containingAncef,bacitracin, and gentamicin. Hemostasis was ensured. A 19 Fr drain was placed in subcutaneous position laterally anda 15 Fr drain placed along inframammary fold. Eachsecured to skin with 2-0 nylon. Cavity irrigated with Betadine.The tissue expanderswere prepared on back table prior in insertion. The expander was filled with air to327ml.Perforated acellular dermis was draped over anterior surface expander. The ADM was then secured to itself over posterior surface of expander. Redundant folds acellular dermis excised so that the ADM lay flat without folds over air filled expander.The expander was secured to medial insertion pectoralis with a 0 vicryl.The superior and lateral tabs also secured to pectoralis muscle with 0-vicryl. The ADM was secured to pectoralis muscle and chest wall along inferior border at inframammary fold.Laterally the mastectomy flap over posterior axillary line was advanced anteriorly and the subcutaneous tissue and superficial fascia was secured to pectoralis muscle and acellular dermis with 0-vicryl. The inferiorly based dermal pedicle was redraped superiorly over expander and acellular dermis and secured to pectoralis with interrupted 0-vicryl. Skin closure completedwith 3-0 vicryl in fascial layer and 4-0 vicryl in dermis. Skin closure completed with 4-0 monocryl subcuticular and tissue adhesive.  The left chest incision was stapled closed, and the patient was placed into right lateral position. Dissection over pectoralis continued toward left axilla.Elliptical skin paddle  designed left backand incision made around skin paddle.Skin and superficial fascia elevated off surface of latissimus muscle and subcutaneous tunnel dissected to axilla  joining anterior breast cavity.Anterior border of latissimus identified and elevated. Muscle divided inferiorly at superior iliac spine. Submuscular dissection completed toward midline back and toward origin. Flap rotated into anterior chest cavity. Back irrigated and hemostasis ensured.  15 Fr drain placed and secured with 2-0 nylon. 2-0 PDS used to placed quilting sutures from elevated skin flaps to chest wall. Local anesthetic infiltrated.Incision closed with 0 V lock suture in superficial fascia and dermis. Skin closure completed with 4-0 monocryl subcuticular and tissue glue applied.Tegaderms applied.  Patient repositioned into supine position. Patient re prepped and draped. Additional time out performed.  Breast cavity was irrigated with solution containingAncef, gentamicin,and bacitracin. Hemostasis was ensured.Thoracodorsal nerve was identified and divided.The skin paddle overlying latissimus muscle was discarded. The muscle was oriented and sutured to pectoralis muscle and chest wall with 2-0 PDS suture in figure of eight fashion. A19 Fr drain was placed in breast cavity and securedto skin with 2-0 nylon. Cavity irrigated with Betadine.The tissue expanderwaspreparedand placed beneath latissimus muscle. The inferior latissimus was secured to superficial fascia and chest wall with 2-0 PDS interrupted.Skin closure completedwith 3-0 vicryl in fascial layer and 4-0 vicryl in dermis. Skin closure completed with 4-0 monocryl subcuticular,and tissue adhesive applied.The expander port was identified and accessed, total 60 ml saline infiltrated.   Tegaderms applied to left chest. Dry dressing and breast binder applied. The patient was allowed to wake from anesthesia, extubated and taken to the recovery room in satisfactory  condition.   SPECIMENS: none  DRAINS: 15 and 19 Fr JP in right breast reconstruction, 15 Fr JP in left back, 19 Fr JP in left breast reconstruction  Irene Limbo, MD [facility] [phone], pin [phone]

## 2019-04-12 NOTE — Anesthesia Procedure Notes (Addendum)
Anesthesia Regional Block: Pectoralis block   Pre-Anesthetic Checklist: ,, timeout performed, Correct Patient, Correct Site, Correct Laterality, Correct Procedure, Correct Position, site marked, Risks and benefits discussed,  Surgical consent,  Pre-op evaluation,  At surgeon's request and post-op pain management  Laterality: Right  Prep: chloraprep       Needles:  Injection technique: Single-shot  Needle Type: Echogenic Stimulator Needle     Needle Length: 10cm  Needle Gauge: 21     Additional Needles:   Procedures:,,,, ultrasound used (permanent image in chart),,,,  Narrative:  Start time: 04/12/2019 7:09 AM End time: 04/12/2019 7:12 AM Injection made incrementally with aspirations every 5 mL.  Performed by: Personally  Anesthesiologist: Lidia Collum, MD  Additional Notes: Monitors applied. Injection made in 5cc increments. No resistance to injection. Good needle visualization. Patient tolerated procedure well.

## 2019-04-12 NOTE — Transfer of Care (Signed)
Immediate Anesthesia Transfer of Care Note  Patient: Alexandria Bradley  Procedure(s) Performed: RIGHT BREAST RISK REDUCING MASTECTOMY; LEFT MASTECTOMY WITH LEFT AXILLARY SENTINEL LYMPH NODE BIOPSY WITH BLUE DYE INJECTION (Bilateral Breast) BILATERAL BREAST RECONSTRUCTION WITH PLACEMENT OF TISSUE EXPANDERS; ALLODERM TO RIGHT CHEST (Bilateral Breast) LEFT LATISSIMUS DORSI FLAP TO LEFT CHEST (Left Breast)  Patient Location: PACU  Anesthesia Type:GA combined with regional for post-op pain  Level of Consciousness: awake, alert  and oriented  Airway & Oxygen Therapy: Patient Spontanous Breathing and Patient connected to face mask oxygen  Post-op Assessment: Report given to RN and Post -op Vital signs reviewed and stable  Post vital signs: Reviewed and stable  Last Vitals:  Vitals Value Taken Time  BP 140/76 04/12/19 1301  Temp    Pulse 75 04/12/19 1302  Resp 29 04/12/19 1302  SpO2 97 % 04/12/19 1302  Vitals shown include unvalidated device data.  Last Pain:  Vitals:   04/12/19 0613  TempSrc:   PainSc: 0-No pain      Patients Stated Pain Goal: 3 (01/60/10 9323)  Complications: No apparent anesthesia complications

## 2019-04-12 NOTE — Anesthesia Postprocedure Evaluation (Signed)
Anesthesia Post Note  Patient: Alexandria Bradley  Procedure(s) Performed: RIGHT BREAST RISK REDUCING MASTECTOMY; LEFT MASTECTOMY WITH LEFT AXILLARY SENTINEL LYMPH NODE BIOPSY WITH BLUE DYE INJECTION (Bilateral Breast) BILATERAL BREAST RECONSTRUCTION WITH PLACEMENT OF TISSUE EXPANDERS; ALLODERM TO RIGHT CHEST (Bilateral Breast) LEFT LATISSIMUS DORSI FLAP TO LEFT CHEST (Left Breast)     Patient location during evaluation: PACU Anesthesia Type: General Level of consciousness: awake and alert Pain management: pain level controlled Vital Signs Assessment: post-procedure vital signs reviewed and stable Respiratory status: spontaneous breathing, nonlabored ventilation and respiratory function stable Cardiovascular status: blood pressure returned to baseline and stable Postop Assessment: no apparent nausea or vomiting Anesthetic complications: no    Last Vitals:  Vitals:   04/12/19 1400 04/12/19 1443  BP: 130/78 117/72  Pulse: 72 74  Resp: (!) 21 16  Temp: (!) 36.3 C (!) 36.4 C  SpO2: 97% 94%    Last Pain:  Vitals:   04/12/19 1443  TempSrc: Axillary  PainSc: 5                  Lidia Collum

## 2019-04-12 NOTE — Op Note (Signed)
Preoperative diagnosis: Recurrent left breast cancer status post primary chemotherapy Postoperative diagnosis: Same as above Procedure: 1.  Right risk reducing mastectomy 2.  Left mastectomy 3.  Injection of blue dye for sentinel lymph node identification 4.  Left deep axillary sentinel lymph node biopsy Surgeon: Dr. Serita Grammes Anesthesia: General with bilateral pectoral blocks Specimens: 1.  Right breast tissue marked short stitch superior, long stitch lateral 2.  Left breast tissue marked short stitch superior, long stitch lateral 3.  Left deep axillary lymph nodes with highest count of 128 Estimated blood loss: 50 cc Drains: Per plastic surgery Complications: None Sponge needle count was correct at the end of my portion of the operation Disposition Case turned over to plastic surgery for reconstruction  Indications: This is 58 year old female who was treated in 2015 for stage I left triple negative breast cancer.  She underwent lumpectomy and radiotherapy at that time.  She did well up until recently when she developed a mass at the site of her prior scar.  This is a triple negative breast cancer as well.  She is undergone primary chemotherapy to try to downstage this with not the greatest response either clinically or radiologically.  We discussed all of her options and she would like to proceed with a risk reducing mastectomy on the right side as well as the recommended mastectomy and attempted sentinel node in combination with a latissimus flap and tissue expander on the left.  Procedure: After informed consent was obtained the patient first underwent bilateral pectoral blocks.  She was injected injected with technetium on the left in the standard periareolar fashion.  Antibiotics were given.  SCDs were in place.  She was then placed under general anesthesia without complication.  She was prepped and draped in the standard sterile surgical fashion.  Surgical timeout was then  performed.  I did the right side first.  I made a skin sparing incision around the nipple and areola.  I created flaps to the clavicle, parasternal area, inframammary fold, and latissimus laterally.  The breast and the pectoralis fascia were then removed from the muscle.  This was passed off the table and marked as above.  Hemostasis was obtained.  I packed this and moved to the other side.  I did inject a mixture of methylene blue dye and saline in the amount of 5 cc in the retroareolar position.  This was for later sentinel lymph node identification.  I then made a teardrop shaped incision on the left side to include my prior scar in the mass as it was near the skin.  I then created flaps in a similar way as to the right side.  I then remove the breast and the pectoralis fashion from the muscle.  This was very adherent in the site of her prior surgery and was much more difficult due to the radiotherapy she had received on this side.  I then remove the breast and passed this off the table.  I then was able to identify the a sentinel node that was hot through the same incision.  I remove this and there was no background radioactivity.  There was no blue dye and no palpable nodes were present.  I then obtained hemostasis and packed this side.  I turned the case over to plastic surgery.

## 2019-04-12 NOTE — Anesthesia Procedure Notes (Signed)
Anesthesia Regional Block: Pectoralis block   Pre-Anesthetic Checklist: ,, timeout performed, Correct Patient, Correct Site, Correct Laterality, Correct Procedure, Correct Position, site marked, Risks and benefits discussed,  Surgical consent,  Pre-op evaluation,  At surgeon's request and post-op pain management  Laterality: Left  Prep: chloraprep       Needles:  Injection technique: Single-shot  Needle Type: Echogenic Stimulator Needle     Needle Length: 10cm  Needle Gauge: 21     Additional Needles:   Procedures:,,,, ultrasound used (permanent image in chart),,,,  Narrative:  Start time: 04/12/2019 7:12 AM End time: 04/12/2019 7:15 AM Injection made incrementally with aspirations every 5 mL.  Performed by: Personally  Anesthesiologist: Lidia Collum, MD  Additional Notes: Monitors applied. Injection made in 5cc increments. No resistance to injection. Good needle visualization. Patient tolerated procedure well.

## 2019-04-12 NOTE — Progress Notes (Signed)
Alexandria Bradley is a 58 y.o. female patient admitted. Awake, alert - oriented  X 4 - no acute distress noted.  VSS - Blood pressure 117/72, pulse 74, temperature (Abnormal) 97.5 F (36.4 C), temperature source Axillary, resp. rate 16, height 5\' 5"  (1.651 m), weight 90.7 kg, SpO2 94 %.    IV in place, occlusive dsg intact without redness.  Orientation to room, and floor completed.  Admission INP armband ID verified with patient/family, and in place.   SR up x 2, fall assessment complete, with patient and family able to verbalize understanding of risk associated with falls, and verbalized understanding to call nsg before up out of bed.  Call light within reach, patient able to voice, and demonstrate understanding. No evidence of skin break down noted on exam.       Will cont to eval and treat per MD orders.  Theora Master, RN 04/12/2019 4:09 PM

## 2019-04-12 NOTE — H&P (Signed)
Alexandria Bradley is an 58 y.o. female.   Chief Complaint: recurrent breast cancer HPI: 61 yof underwent left lump/sn for stage I tnbc followed by adjuvant chemo/radiotherapy. completed in january of 2015. she has mm in april 2019 that is normal she does not have any nipple dc. she has full function of lue and has no symptoms of lymphedema. she remains active. she has noted a left breast mass near her scar that has been noted for a few weeks. I sent her for mm and [place]. mm shows increased density in region of scar. US shows a mass near scar measuring 14 mm in size. there is no axillary adenopathy. core biopsy was performed and this is grade 1-2 idc, triple negative. mri showed only the 1.7 cm mass and no other findings. she has completed chemotherapy now. she has repeat mri that shows slightly smaller mass in left uiq now measuring 1.5 cm (was 1.7cm). nodes are negative and right breast is negative. she is here to discuss surgery. plan for immediate recon with left lat flap and te   Past Medical History:  Diagnosis Date  . Anxiety   . Arthritis   . Back pain   . Breast cancer ([place])    left  . Fatty liver   . GERD (gastroesophageal reflux disease)   . Headache(784.0)    PMH : migraines  . History of kidney stones   . Hypercholesterolemia   . Hypertension   . Hypothyroidism   . Kidney stones   . Osteoporosis   . Personal history of chemotherapy   . Personal history of radiation therapy   . PONV (postoperative nausea and vomiting)    difficult to wake up  . Pre-diabetes   . Psoriasis   . PVC's (premature ventricular contractions)   . Radiation 08/18/14-10/07/14   Left breast    Past Surgical History:  Procedure Laterality Date  . BREAST BIOPSY Left 11/10/2018  . BREAST LUMPECTOMY Left 2015  . BREAST LUMPECTOMY WITH AXILLARY LYMPH NODE BIOPSY  6/15   left  . CARDIAC CATHETERIZATION    . CHOLECYSTECTOMY    . COLONOSCOPY N/A 06/27/2014   Procedure: COLONOSCOPY;  Surgeon:  Lear Ng, MD;  Location: [facility];  Service: Endoscopy;  Laterality: N/A;  . LEFT HEART CATH AND CORONARY ANGIOGRAPHY N/A 07/24/2018   Procedure: LEFT HEART CATH AND CORONARY ANGIOGRAPHY;  Surgeon: Troy Sine, MD;  Location: [facility];  Service: Cardiovascular;  Laterality: N/A;  . LIPOMA EXCISION    . MYOMECTOMY    . PORT-A-CATH REMOVAL N/A 02/03/2015   Procedure: REMOVAL PORT-A-CATH;  Surgeon: [name], MD;  Location: [facility];  Service: General;  Laterality: N/A;  . PORTACATH PLACEMENT N/A 03/01/2014   Procedure: INSERTION PORT-A-CATH;  Surgeon: [name], MD;  Location: [facility];  Service: General;  Laterality: N/A;  . PORTACATH PLACEMENT N/A 11/18/2018   Procedure: INSERTION PORT-A-CATH WITH ULTRASOUND;  Surgeon: [name], MD;  Location: [facility];  Service: General;  Laterality: N/A;  . TONSILLECTOMY    . TUBAL LIGATION      Family History  Problem Relation Age of Onset  . Endometrial cancer Mother 25  . Hearing loss Mother   . Atrial fibrillation Mother   . Lung cancer Father   . Brain cancer Father   . Heart disease Maternal Aunt   . Atrial fibrillation Maternal Aunt   . Lung cancer Maternal Uncle   . Atrial fibrillation Maternal Uncle   .  Stroke Maternal Grandmother   . Stroke Maternal Grandfather   . Bladder Cancer Maternal Uncle   . Multiple myeloma Maternal Uncle   . Other Son        trisomy 25   Social History:  reports that she has quit smoking. Her smoking use included cigarettes. She has a 7.50 pack-year smoking history. She has never used smokeless tobacco. She reports current alcohol use. She reports that she does not use drugs.  Allergies:  Allergies  Allergen Reactions  . Chlorhexidine Itching  . Ciprofloxacin Anxiety, Rash and Other (See Comments)    GI upset  . Prednisone Shortness Of Breath and Other (See Comments)    "jacks me up" jittery   . Codeine Nausea Only  .  Cortisone Swelling and Other (See Comments)    "jack me up"     Medications Prior to Admission  Medication Sig Dispense Refill  . acetaminophen (TYLENOL) 500 MG tablet Take 1,000 mg by mouth at bedtime.     . calcium carbonate (TUMS - DOSED IN MG ELEMENTAL CALCIUM) 500 MG chewable tablet Chew 1 tablet by mouth at bedtime as needed for indigestion or heartburn.    . diltiazem (CARDIZEM CD) 240 MG 24 hr capsule Take 1 capsule (240 mg total) by mouth daily. 90 capsule 3  . lidocaine-prilocaine (EMLA) cream Apply to affected area once (Patient taking differently: Apply 1 application topically once. ) 30 g 3  . liothyronine (CYTOMEL) 5 MCG tablet Take 5 mcg by mouth daily before breakfast.     . omeprazole (PRILOSEC) 20 MG capsule Take 1 capsule (20 mg total) by mouth 2 (two) times daily before a meal. (Patient taking differently: Take 20 mg by mouth 2 (two) times daily as needed (for heartburn or acid reflux). ) 60 capsule 6  . potassium chloride (K-DUR) 10 MEQ tablet Take 1 tablet (10 mEq total) by mouth 2 (two) times daily. (Patient taking differently: Take 10 mEq by mouth daily. ) 80 tablet 2  . Prenatal Vit-Fe Fumarate-FA (PRENATAL PO) Take 1 tablet by mouth daily.    . prochlorperazine (COMPAZINE) 10 MG tablet Take 1 tablet (10 mg total) by mouth every 6 (six) hours as needed (Nausea or vomiting). 30 tablet 1  . TIROSINT 100 MCG CAPS Take 100 mcg by mouth daily before breakfast.   4  . triamterene-hydrochlorothiazide (MAXZIDE-25) 37.5-25 MG tablet Take 1 tablet by mouth daily. 90 tablet 3  . ondansetron (ZOFRAN) 8 MG tablet Take 1 tablet (8 mg total) by mouth every 8 (eight) hours as needed for nausea or vomiting. (Patient not taking: Reported on 03/30/2019) 20 tablet 0  . propranolol (INDERAL) 10 MG tablet Take 10 mg by mouth daily as needed (for heart palpitations).       No results found for this or any previous visit (from the past 48 hour(s)). No results found.  Review of Systems  All  other systems reviewed and are negative.   Blood pressure (!) 156/87, pulse 94, temperature 97.9 F (36.6 C), temperature source Oral, resp. rate 18, height _0  (1.651 m), weight 90.7 kg, SpO2 96 %. Physical Exam  cv rrr Lungs clear Breast right uiq mass with healed scar  Assessment/Plan RECURRENT BREAST CANCER, LEFT (C50.912) Story: Left mastectomy, right risk reducing mastectomy, left axillary sn biopsy (attempt repeat) needs mastectomy and attempt at repeat sn on the left at time of surgery. she would like right risk reducing mastectomy as well. would also like reconstruction which will likely be  expanders and a lat flap on left. she has discussed options with plastics. she doesnt really want to travel to do diep flap right now. discussed surgery again today and will proceed july 13. discussed if unable to locate sn will not dissect axilla.  [name], MD 04/12/2019, 7:01 AM

## 2019-04-12 NOTE — Interval H&P Note (Signed)
History and Physical Interval Note:  04/12/2019 6:54 AM  Alexandria Bradley  has presented today for surgery, with the diagnosis of recurrent left breast cancer.  The various methods of treatment have been discussed with the patient and family. After consideration of risks, benefits and other options for treatment, the patient has consented to  Procedure(s): LEFT MASTECTOMY WITH LEFT AXILLARY SENTINEL LYMPH NODE BIOPSY WITH BLUE DYE INJECTION AND RIGHT RISK REDUCING MASTECTOMY (Bilateral) BILATERAL BREAST RECONSTRUCTION WITH PLACEMENT OF TISSUE EXPANDER AND ALLODERM TO RIGHT CHEST (Bilateral) LATISSIMUS FLAP TO BREAST (Left) as a surgical intervention.  The patient's history has been reviewed, patient examined, no change in status, stable for surgery.  I have reviewed the patient's chart and labs.  Questions were answered to the patient's satisfaction.     Arnoldo Hooker Vallerie Hentz

## 2019-04-12 NOTE — Interval H&P Note (Signed)
History and Physical Interval Note:  04/12/2019 7:03 AM  Alexandria Bradley  has presented today for surgery, with the diagnosis of recurrent left breast cancer.  The various methods of treatment have been discussed with the patient and family. After consideration of risks, benefits and other options for treatment, the patient has consented to  Procedure(s): LEFT MASTECTOMY WITH LEFT AXILLARY SENTINEL LYMPH NODE BIOPSY WITH BLUE DYE INJECTION AND RIGHT RISK REDUCING MASTECTOMY (Bilateral) BILATERAL BREAST RECONSTRUCTION WITH PLACEMENT OF TISSUE EXPANDER AND ALLODERM TO RIGHT CHEST (Bilateral) LATISSIMUS FLAP TO BREAST (Left) as a surgical intervention.  The patient's history has been reviewed, patient examined, no change in status, stable for surgery.  I have reviewed the patient's chart and labs.  Questions were answered to the patient's satisfaction.     [name]

## 2019-04-13 ENCOUNTER — Encounter (HOSPITAL_COMMUNITY): Payer: Self-pay | Admitting: General Surgery

## 2019-04-13 DIAGNOSIS — C50212 Malignant neoplasm of upper-inner quadrant of left female breast: Secondary | ICD-10-CM | POA: Diagnosis not present

## 2019-04-13 LAB — CBC
HCT: 29.5 % — ABNORMAL LOW (ref 36.0–46.0)
Hemoglobin: 9.1 g/dL — ABNORMAL LOW (ref 12.0–15.0)
MCH: 33.6 pg (ref 26.0–34.0)
MCHC: 30.8 g/dL (ref 30.0–36.0)
MCV: 108.9 fL — ABNORMAL HIGH (ref 80.0–100.0)
Platelets: 336 10*3/uL (ref 150–400)
RBC: 2.71 MIL/uL — ABNORMAL LOW (ref 3.87–5.11)
RDW: 15.7 % — ABNORMAL HIGH (ref 11.5–15.5)
WBC: 11.5 10*3/uL — ABNORMAL HIGH (ref 4.0–10.5)
nRBC: 0 % (ref 0.0–0.2)

## 2019-04-13 LAB — BASIC METABOLIC PANEL
Anion gap: 11 (ref 5–15)
BUN: 6 mg/dL (ref 6–20)
CO2: 25 mmol/L (ref 22–32)
Calcium: 9 mg/dL (ref 8.9–10.3)
Chloride: 102 mmol/L (ref 98–111)
Creatinine, Ser: 0.73 mg/dL (ref 0.44–1.00)
GFR calc Af Amer: >60 mL/min (ref >60)
GFR calc non Af Amer: >60 mL/min (ref >60)
Glucose, Bld: 113 mg/dL — ABNORMAL HIGH (ref 70–99)
Potassium: 3.5 mmol/L (ref 3.5–5.1)
Sodium: 138 mmol/L (ref 135–145)

## 2019-04-13 MED ORDER — DOXYCYCLINE HYCLATE 50 MG PO CAPS: 50.0000 mg | ORAL_CAPSULE | Freq: Two times a day (BID) | ORAL | 0 refills | Status: DC

## 2019-04-13 MED ORDER — OXYCODONE HCL 5 MG PO TABS: 5.0000 mg | ORAL_TABLET | ORAL | 0 refills | Status: DC | PRN

## 2019-04-13 MED ORDER — METHOCARBAMOL 500 MG PO TABS: 500.0000 mg | ORAL_TABLET | Freq: Three times a day (TID) | ORAL | 0 refills | Status: DC | PRN

## 2019-04-13 MED ORDER — CEFAZOLIN SODIUM-DEXTROSE 1-4 GM/50ML-% IV SOLN
1.0000 g | Freq: Three times a day (TID) | INTRAVENOUS | Status: DC
  Administered 2019-04-13: 14:00:00 1 g via INTRAVENOUS
  Filled 2019-04-13 (×3): qty 50

## 2019-04-13 NOTE — Progress Notes (Signed)
Patient ID: Alexandria Bradley, female   DOB: June 10, 1961, 58 y.o.   MRN: [id number] Doing well, hopefully home today, path pending

## 2019-04-13 NOTE — Discharge Summary (Signed)
Physician Discharge Summary  Patient ID: Alexandria Bradley MRN: [id number] DOB/AGE: [birth year] 58 y.o.  Admit date: 04/12/2019 Discharge date: 04/13/2019  Admission Diagnoses: Recurrent left breast cancer  Discharge Diagnoses:  Active Problems:   Recurrent cancer of left breast [facility])  Discharged Condition: stable  Hospital Course: Post operatively patient tolerated diet and oral pain medication. She was able to ambulate on POD#1 and void without difficulty.  Treatments: surgery: bilateral mastectomies left SLN bilateral breast reconstruction with tissue expander right chest acellular dermis left latissimus dorsi flap 7.13.20  Discharge Exam: Blood pressure 129/69, pulse 76, temperature 97.9 F (36.6 C), temperature source Oral, resp. rate 18, height 5\' 5"  (1.651 m), weight 90.7 kg, SpO2 94 %. Incision/Wound: Chest soft bilateral with incisions dry intact Tegaderms in place developing ecchymoses no hematoma Back flat incision intact Drains watery dark serosanguinous  Disposition: Discharge disposition: 01-Home or Self Care       Discharge Instructions    Call MD for:  redness, tenderness, or signs of infection (pain, swelling, bleeding, redness, odor or green/yellow discharge around incision site)   Complete by: As directed    Call MD for:  temperature >100.5   Complete by: As directed    Discharge instructions   Complete by: As directed    Ok to remove dressings and shower am 7.15.20. Soap and water ok, pat Tegaderms dry. Do not remove Tegaderms. No creams or ointments over incisions. Do not let drains dangle in shower, attach to lanyard or similar.Strip and record drains twice daily and bring log to clinic visit.  Breast binder or soft compression bra all other times.  Ok to raise arms above shoulders for bathing and dressing.  No house yard work or exercise until cleared by MD.   Recommend ibuprofen with meals to aid with pain control. Ok to also use Tylenol as directed  for pain control. Recommend Miralax or Dulcolax as needed for constipation.   Driving Restrictions   Complete by: As directed    No driving for 2 weeks then no driving if taking narcotics   Lifting restrictions   Complete by: As directed    No lifting > 5 lbs until cleared by MD   Resume previous diet   Complete by: As directed       Follow-up Information    [name], MD In 2 weeks.   Specialty: General Surgery Contact information: [address] [name] [address] [phone]        Irene Limbo, MD In 1 week.   Specialty: Plastic Surgery Why: as scheduled Contact information: [place] [address] [place] [address] [phone]           Signed: Irene Limbo 04/13/2019, 3:43 PM

## 2019-04-13 NOTE — Progress Notes (Signed)
  Plastic Surgery  POD#1 bilateral mastectomies left SLN bilateral reconstruction with TE, left LD flap  Temp:  [97.3 F (36.3 C)-99.1 F (37.3 C)] 98.3 F (36.8 C) (07/14 0610) Pulse Rate:  [71-88] 88 (07/14 0610) Resp:  [13-21] 18 (07/14 0610) BP: (108-140)/(64-80) 134/80 (07/14 0610) SpO2:  [92 %-100 %] 96 % (07/14 0610)   JP R 16/43 L 85/115  Alert oriented noted more soreness left back today Has not ambulated- attempted this 7.13 to get to BR and felt like she was going to pass out Had purewick in place until this am Wants to get to BR to brush teeth  PE Chest soft bilateral with incisions dry intact Tegaderms in place developing ecchymoses no hematoma Back flat incision intact Drains watery dark serosanguinous  A/P Needs to ambulate- will check back in pm if ready for d/c. On oral pain medication and toradol, no morphine overnight Tolerating diet  Irene Limbo, MD [facility] Plastic & Reconstructive Surgery [phone], pin [phone]

## 2019-04-13 NOTE — Progress Notes (Signed)
Discharged home today, patient's husband driving her home. Personal belongings,discharged instructions given to patient. Advised to pick up Medications called in to pharmacy of choice. Verbalized understanding of instructions.

## 2019-04-14 ENCOUNTER — Encounter (HOSPITAL_COMMUNITY): Payer: Self-pay | Admitting: General Surgery

## 2019-04-19 ENCOUNTER — Encounter (HOSPITAL_COMMUNITY): Payer: Self-pay | Admitting: General Surgery

## 2019-04-20 DIAGNOSIS — Z9013 Acquired absence of bilateral breasts and nipples: Secondary | ICD-10-CM | POA: Insufficient documentation

## 2019-05-03 ENCOUNTER — Ambulatory Visit: Payer: 59 | Admitting: Oncology

## 2019-05-03 ENCOUNTER — Other Ambulatory Visit: Payer: 59

## 2019-05-03 ENCOUNTER — Ambulatory Visit: Payer: 59

## 2019-05-04 ENCOUNTER — Telehealth: Payer: Self-pay | Admitting: Oncology

## 2019-05-04 NOTE — Telephone Encounter (Signed)
Scheduled appt per 8/04 sch message- pt aware of new appt date and time

## 2019-05-10 NOTE — Progress Notes (Signed)
[place]  [phone] Fax:(336) [phone]    ID: [name] OB: 1960-10-28  MR#: [id number]  KPQ#:[id number]  Patient Care Team: [name], DO as PCP - General (Family Medicine) Josue Hector, MD as PCP - Cardiology (Cardiology) , Virgie Dad, MD as Consulting Physician (Oncology) Brien Few, MD as Consulting Physician (Obstetrics and Gynecology) [name], MD as Consulting Physician (General Surgery) Delrae Rend, MD as Consulting Physician (Endocrinology) Wilford Corner, MD as Consulting Physician (Gastroenterology) Dorna Leitz, MD as Consulting Physician (Orthopedic Surgery) Josue Hector, MD as Consulting Physician (Cardiology) Irene Limbo, MD as Consulting Physician (Plastic Surgery) OTHER MD:    CHIEF COMPLAINT: Triple negative breast cancer, locally recurrent (s/p bilateral mastectomies)  CURRENT TREATMENT: Considering adjuvant capecitabine/pembrolizumab  INTERVAL HISTORY: Alexandria Bradley was seen today for follow-up and treatment of her locally recurrent triple negative breast cancer.  Since her last visit, she underwent bilateral breast MRI on 03/19/2019. This showed: breast composition B; slightly smaller mass in the upper-inner quadrant of the left breast, at the lumpectomy site, now measuring 1.5 cm (previously 1.7 cm); no new mass in either breast; indeterminate T2 bright lesion in the anterior aspect of the liver, favored to represent a benign cyst on recent CT.  She proceeded to bilateral mastectomies with left axillary sentinel lymph node biopsy on 04/12/2019. Pathology from the procedure ([id number]) showed: 1. 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 Left Breast  - recurrent invasive ductal carcinoma, grade 3, 0.5 cm  - margins uninvolved by carcinoma  -columnar cell and  fibrocystic changes with apocrine metaplasia 3. Lymph node (1)  - no carcinoma identified  She underwent reconstruction during her mastectomy procedure.    REVIEW OF SYSTEMS: Fonda reports her bowel movements are regular again since finishing her antibiotics. She states surgery was okay-- Dr. Iran Planas had to change plans during surgery. She reports taking ibuprofen during the day and oxycodone at night. She had low grade fevers, with the highest being 100.5, at night. She also had issues with the drains on the left side. She is scheduled to begin physical therapy next week. A detailed review of systems was otherwise stable.    BREAST CANCER HISTORY: From the original intake note of 02/16/2014:  [name] palpated a mass in her left breast early May and brought it immediately to her gynecologist attention. He set her up for bilateral diagnostic mammography and left ultrasonography at [facility] 02/07/2014. Mammography showed a 1.3 cm oval mass with spiculated margins in the left breast at the 11:00 position. This was palpable. Ultrasound showed a 1.1 cm tall her than wide mass with a microlobulated margins. He was hypoechoic. There were no abnormalities noted in the left axilla.  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 Lyndon Code at conference 02/16/2014), triple negative, with an MIB-1 of 50%.  The patient's subsequent history is as detailed below   PAST MEDICAL HISTORY: Past Medical History:  Diagnosis Date  . Anxiety   . Arthritis   . Back pain   . Breast cancer ([place])    left  . Fatty liver   . GERD (gastroesophageal reflux disease)   . Headache(784.0)    PMH : migraines  . History of kidney stones   . Hypercholesterolemia   . Hypertension   .  Hypothyroidism   . Kidney stones   . Osteoporosis   . Personal history of chemotherapy   . Personal history of radiation  therapy   . PONV (postoperative nausea and vomiting)    difficult to wake up  . Pre-diabetes   . Psoriasis   . PVC's (premature ventricular contractions)   . Radiation 08/18/14-10/07/14   Left breast    PAST SURGICAL HISTORY: Past Surgical History:  Procedure Laterality Date  . BREAST BIOPSY Left 11/10/2018  . BREAST LUMPECTOMY Left 2015  . BREAST LUMPECTOMY WITH AXILLARY LYMPH NODE BIOPSY  6/15   left  . BREAST RECONSTRUCTION WITH PLACEMENT OF TISSUE EXPANDER AND ALLODERM Bilateral 04/12/2019   Procedure: BILATERAL BREAST RECONSTRUCTION WITH PLACEMENT OF TISSUE EXPANDERS; ALLODERM TO RIGHT CHEST;  Surgeon: Irene Limbo, MD;  Location: [place];  Service: Plastics;  Laterality: Bilateral;  . CARDIAC CATHETERIZATION    . CHOLECYSTECTOMY    . COLONOSCOPY N/A 06/27/2014   Procedure: COLONOSCOPY;  Surgeon: Lear Ng, MD;  Location: [facility] ENDOSCOPY;  Service: Endoscopy;  Laterality: N/A;  . LATISSIMUS FLAP TO BREAST Left 04/12/2019   Procedure: LEFT LATISSIMUS DORSI FLAP TO LEFT CHEST;  Surgeon: Irene Limbo, MD;  Location: [place];  Service: Plastics;  Laterality: Left;  . LEFT HEART CATH AND CORONARY ANGIOGRAPHY N/A 07/24/2018   Procedure: LEFT HEART CATH AND CORONARY ANGIOGRAPHY;  Surgeon: Troy Sine, MD;  Location: [place] CV LAB;  Service: Cardiovascular;  Laterality: N/A;  . LIPOMA EXCISION    . MASTECTOMY W/ SENTINEL NODE BIOPSY Bilateral 04/12/2019   Procedure: RIGHT BREAST RISK REDUCING MASTECTOMY; LEFT MASTECTOMY WITH LEFT AXILLARY SENTINEL LYMPH NODE BIOPSY WITH BLUE DYE INJECTION;  Surgeon: [name], MD;  Location: [place];  Service: General;  Laterality: Bilateral;  . MYOMECTOMY    . PORT-A-CATH REMOVAL N/A 02/03/2015   Procedure: REMOVAL PORT-A-CATH;  Surgeon: [name], MD;  Location: [place];  Service: General;  Laterality: N/A;  . PORTACATH PLACEMENT N/A 03/01/2014   Procedure: INSERTION PORT-A-CATH;  Surgeon: [name], MD;  Location: [place];  Service: General;  Laterality: N/A;  . PORTACATH PLACEMENT N/A 11/18/2018   Procedure: INSERTION PORT-A-CATH WITH ULTRASOUND;  Surgeon: [name], MD;  Location: [place];  Service: General;  Laterality: N/A;  . TONSILLECTOMY    . TUBAL LIGATION      FAMILY HISTORY: Family History  Problem Relation Age of Onset  . Endometrial cancer Mother 37  . Hearing loss Mother   . Atrial fibrillation Mother   . Lung cancer Father   . Brain cancer Father   . Heart disease Maternal Aunt   . Atrial fibrillation Maternal Aunt   . Lung cancer Maternal Uncle   . Atrial fibrillation Maternal Uncle   . Stroke Maternal Grandmother   . Stroke Maternal Grandfather   . Bladder Cancer Maternal Uncle   . Multiple myeloma Maternal Uncle   . Other Son        trisomy 28   The patient's father died at the age of 67 from what may have been metastatic lung cancer. The patient knows very little about the father's side of her family. Her mother is alive at age 37. She was recently diagnosed with endometrial cancer. The patient has one brother, 2 sisters. There is no history of breast or ovarian cancer in the family.   GYNECOLOGIC HISTORY:  Menarche age 71, first live birth age 49. The patient is GX P3. One child died shortly  after birth. She stopped having periods approximately 2005. She did not use hormone replacement. 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 Son [name] is  Nurse, adult in [place]. Daughter [name] lives in [place] and works for [employer] as an Therapist, sports. She has three grandchildren.    ADVANCED DIRECTIVES: Not in place   HEALTH MAINTENANCE: Social History    Tobacco Use  . Smoking status: Former Smoker    Packs/day: 0.50    Years: 15.00    Pack years: 7.50    Types: Cigarettes  . Smokeless tobacco: Never Used  . Tobacco comment: Quit smoking cigarettes in 2002  Substance Use Topics  . Alcohol use: Yes    Comment: social  . Drug use: No     Colonoscopy: 06/27/2014, normal, [name]  PAP: 2013  Bone density: 04/10/2015, -1.1 ([facility])  Lipid panel:  Allergies  Allergen Reactions  . Chlorhexidine Itching  . Ciprofloxacin Anxiety, Rash and Other (See Comments)    GI upset  . Prednisone Shortness Of Breath and Other (See Comments)    "jacks me up" jittery   . Codeine Nausea Only  . Cortisone Swelling and Other (See Comments)    "jack me up"     Current Outpatient Medications  Medication Sig Dispense Refill  . calcium carbonate (TUMS - DOSED IN MG ELEMENTAL CALCIUM) 500 MG chewable tablet Chew 1 tablet by mouth at bedtime as needed for indigestion or heartburn.    . diltiazem (CARDIZEM CD) 240 MG 24 hr capsule Take 1 capsule (240 mg total) by mouth daily. 90 capsule 3  . doxycycline (VIBRAMYCIN) 50 MG capsule Take 1 capsule (50 mg total) by mouth 2 (two) times daily. 10 capsule 0  . lidocaine-prilocaine (EMLA) cream Apply to affected area once (Patient taking differently: Apply 1 application topically once. ) 30 g 3  . liothyronine (CYTOMEL) 5 MCG tablet Take 5 mcg by mouth daily before breakfast.     . methocarbamol (ROBAXIN) 500 MG tablet Take 1 tablet (500 mg total) by mouth every 8 (eight) hours as needed for muscle spasms. 30 tablet 0  . omeprazole (PRILOSEC) 20 MG capsule Take 1 capsule (20 mg total) by mouth 2 (two) times daily before a meal. (Patient taking differently: Take 20 mg by mouth 2 (two) times daily as needed (for heartburn or acid reflux). ) 60 capsule 6  . ondansetron (ZOFRAN) 8 MG tablet Take 1 tablet (8 mg total) by mouth every 8 (eight) hours as needed for nausea or vomiting. (Patient not taking: Reported on  03/30/2019) 20 tablet 0  . oxyCODONE (OXY IR/ROXICODONE) 5 MG immediate release tablet Take 1-2 tablets (5-10 mg total) by mouth every 4 (four) hours as needed for moderate pain. 40 tablet 0  . potassium chloride (K-DUR) 10 MEQ tablet Take 1 tablet (10 mEq total) by mouth 2 (two) times daily. (Patient taking differently: Take 10 mEq by mouth daily. ) 80 tablet 2  . Prenatal Vit-Fe Fumarate-FA (PRENATAL PO) Take 1 tablet by mouth daily.    . prochlorperazine (COMPAZINE) 10 MG tablet Take 1 tablet (10 mg total) by mouth every 6 (six) hours as needed (Nausea or vomiting). 30 tablet 1  . propranolol (INDERAL) 10 MG tablet Take 10 mg by mouth  daily as needed (for heart palpitations).     . TIROSINT 100 MCG CAPS Take 100 mcg by mouth daily before breakfast.   4  . triamterene-hydrochlorothiazide (MAXZIDE-25) 37.5-25 MG tablet Take 1 tablet by mouth daily. 90 tablet 3   No current facility-administered medications for this visit.     OBJECTIVE: Middle-aged white woman who appears stated age 40:   05/11/19 1553  BP: 119/71  Pulse: 92  Resp: 18  Temp: 98 F (36.7 C)  SpO2: 95%     Body mass index is 32.68 kg/m.    ECOG FS:1 - Symptomatic but completely ambulatory  Sclerae unicteric, EOMs intact Wearing a mask No cervical or supraclavicular adenopathy Lungs no rales or rhonchi Heart regular rate and rhythm Abd soft, nontender, positive bowel sounds MSK no focal spinal tenderness, no upper extremity lymphedema Neuro: nonfocal, well oriented, appropriate affect Breasts: The right breast is status post mastectomy with expander in place.  The incision is healing very nicely, without dehiscence, erythema, or swelling.  The cosmetic result is good.  On the left the patient is status post mastectomy and latissimus flap with expander in place.  This breast is slightly smaller and especially laterally there is some loss of the normal contour.  The incisions however are healing again without any  swelling erythema or dehiscence.  Both axillae are benign.   LAB RESULTS: No results found for: SPEP  Lab Results  Component Value Date   WBC 8.7 05/11/2019   NEUTROABS 5.3 05/11/2019   HGB 11.2 (L) 05/11/2019   HCT 35.8 (L) 05/11/2019   MCV 102.3 (H) 05/11/2019   PLT 343 05/11/2019      Chemistry      Component Value Date/Time   NA 139 05/11/2019 1523   NA 140 06/19/2017 1312   K 3.1 (L) 05/11/2019 1523   K 3.5 06/19/2017 1312   CL 101 05/11/2019 1523   CO2 23 05/11/2019 1523   CO2 25 06/19/2017 1312   BUN 12 05/11/2019 1523   BUN 18.4 06/19/2017 1312   CREATININE 0.89 05/11/2019 1523   CREATININE 0.8 06/19/2017 1312      Component Value Date/Time   CALCIUM 9.8 05/11/2019 1523   CALCIUM 10.2 06/19/2017 1312   ALKPHOS 76 05/11/2019 1523   ALKPHOS 86 06/19/2017 1312   AST 74 (H) 05/11/2019 1523   AST 15 06/19/2017 1312   ALT 39 05/11/2019 1523   ALT 22 06/19/2017 1312   BILITOT 0.3 05/11/2019 1523   BILITOT 0.26 06/19/2017 1312       No results found for: LABCA2  No components found for: LABCA125  No results for input(s): INR in the last 168 hours.  Urinalysis    Component Value Date/Time   COLORURINE YELLOW 03/13/2009 2251   APPEARANCEUR CLOUDY (A) 03/13/2009 2251   LABSPEC 1.020 06/15/2014 1101   PHURINE 6.0 06/15/2014 1101   PHURINE 5.5 03/13/2009 2251   GLUCOSEU Negative 06/15/2014 1101   HGBUR Negative 06/15/2014 1101   HGBUR NEGATIVE 03/13/2009 2251   BILIRUBINUR Color Interference 06/15/2014 1101   KETONESUR Negative 06/15/2014 1101   KETONESUR NEGATIVE 03/13/2009 2251   PROTEINUR 30 06/15/2014 1101   PROTEINUR NEGATIVE 03/13/2009 2251   UROBILINOGEN 0.2 06/15/2014 1101   NITRITE Negative 06/15/2014 1101   NITRITE NEGATIVE 03/13/2009 2251   LEUKOCYTESUR Trace 06/15/2014 1101    STUDIES: Nm Sentinel Node Inj-no Rpt (breast)  Result Date: 04/12/2019 Sulfur colloid was injected by the nuclear medicine technologist for melanoma sentinel  node.  ASSESSMENT: 58 y.o. [place] woman status post left breast upper inner quadrant biopsy 02/08/2014 for a clinical T1c N0, stage IA invasive ductal carcinoma, grade 3, triple negative, with an MIB-1 of 50%.  (1) status post left lumpectomy and sentinel lymph node sampling 03/01/2014 for a pT1c pN0, stage IA invasive ductal carcinoma, grade 3, with negative margins, repeat prognostic panel again triple negative.  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  Left breast with breath hold technique/ 45 Gy at 1.8 Gy per fraction x 25 fractions.   Left breast boost/ 16 Gy at 2 Gy per fraction x 8 fractions  (4) genetics counseling 07/06/2015 through the Breast/Ovarian cancer gene panel offered by [facility] found no deleterious mutations in ATM, BARD1, BRCA1, BRCA2, BRIP1, CDH1, CHEK2, EPCAM, FANCC, MLH1, MSH2, MSH6, NBN, PALB2, PMS2, PTEN, RAD51C, RAD51D, TP53, and XRCC2.  (5) question of sleep apnea  (6) transverse colon lesion noted on CT scan from 06/17/2014 - negative biopsy, also showed fatty liver  (a) repeat CT scan of the abdomen and pelvis with contrast 02/15/2016 negative  RECURRENT DISEASE: February 2020 (7) biopsy of palpable mass upper inner left breast 11/10/2018 confirms invasive ductal carcinoma, grade 2 or 3, triple negative, with an MIB-1 of 80%.  (a) CT scans of the chest abdomen and pelvis with contrast showed no stage IV disease  (b) bone scan 11/13/2018 shows no definite metastasis to bone  (8) neoadjuvant chemotherapy consisting of  carboplatin and gemcitabine given days 1 and 8 of each 21-day cycle, for 6 cycles, started 11/26/2018, completed 03/18/2019  (a) repeat ultrasound 01/25/2019 (after 3 cycles) shows no change in measurable breast mass   (9) bilateral mastectomies as follows   (a) left mastectomy and sentinel lymph node sampling with immediate tissue expander placement and latissimus flap 04/12/2019 showed a residual ypT1a ypN0 invasive ductal carcinoma, grade 3, with negative margins; a single sentinel lymph node was removed.  Margins were negative  (b) right mastectomy with immediate implant placement showed no malignancy  (10) molecular testing on the 11/10/2018 biopsy showed negative P D-L1, negative androgen receptor, stable MSI and proficient mismatch repair status.  The tumor mutational burden was low.  No PI K3 mutation was detected.  There was a pathogenic TP 53 variant and negative ER PR and HER-2 were confirmed.  (11) considering capecitabine versus Keytruda versus both versus observation   PLAN: Ronee is recovering well from her bilateral mastectomies.  We discussed pain issues and I suggested she try ibuprofen 200 mg with Tylenol 500 mg together up to 3 times a day and see if this allows her to move a bit more comfortably.  Of course she still has to go on to physical therapy and will continue to have the expanders injected and eventually undergo the definitive procedure.  I reassured her that the ultimate cosmetic result is likely to be good  The question is how to proceed from here.  She ended up having a better response than we expected--we thought it would be a T1c residual tumor and it was T1a.  This is close to a complete pathologic response.  Today we discussed  for different options.  She could do capecitabine/Xeloda for 6 months.  We have data for that in triple negative disease.  She could do pembrolizumab/Keytruda adjuvantly for 1 year.  Her tumor is PDL 1 negative but there is some data that  even in those cases triple negative tumors may benefit.  She could do both the above, sequentially.  Or, since she had such a good pathologic response, I would not be uncomfortable with observation only, which is the standard of care here.  We discussed the possible toxicities side effects and complications of each 1 of these options.  She is going to discuss this with her husband.  She is scheduled the first week in September for a visit and her first pembrolizumab dose.  We are leaving that in place.  If she makes a definitive decision prior to that she will let us know.  In any case I am very pleased with how well she has done.  She knows to call for any other issue that may develop before her next visit here.  Virgie Dad. , MD 05/11/19 5:33 PM Medical Oncology and Hematology [facility] [address] [place], [place] [address] Tel. [phone]    Fax. [phone]   I, Wilburn Mylar, am acting as scribe for Dr. Virgie Dad. .  I, Lurline Del MD, have reviewed the above documentation for accuracy and completeness, and I agree with the above.

## 2019-05-11 ENCOUNTER — Inpatient Hospital Stay: Payer: 59 | Attending: Oncology | Admitting: Oncology

## 2019-05-11 ENCOUNTER — Inpatient Hospital Stay: Payer: 59

## 2019-05-11 ENCOUNTER — Other Ambulatory Visit: Payer: Self-pay

## 2019-05-11 VITALS — BP 119/71 | HR 92 | Temp 98.0°F | Resp 18 | Ht 65.0 in | Wt 196.4 lb

## 2019-05-11 DIAGNOSIS — Z79899 Other long term (current) drug therapy: Secondary | ICD-10-CM | POA: Diagnosis not present

## 2019-05-11 DIAGNOSIS — C50912 Malignant neoplasm of unspecified site of left female breast: Secondary | ICD-10-CM | POA: Diagnosis not present

## 2019-05-11 DIAGNOSIS — Z87891 Personal history of nicotine dependence: Secondary | ICD-10-CM | POA: Diagnosis not present

## 2019-05-11 DIAGNOSIS — E039 Hypothyroidism, unspecified: Secondary | ICD-10-CM | POA: Insufficient documentation

## 2019-05-11 DIAGNOSIS — F419 Anxiety disorder, unspecified: Secondary | ICD-10-CM | POA: Diagnosis not present

## 2019-05-11 DIAGNOSIS — C50212 Malignant neoplasm of upper-inner quadrant of left female breast: Secondary | ICD-10-CM | POA: Diagnosis not present

## 2019-05-11 DIAGNOSIS — Z9013 Acquired absence of bilateral breasts and nipples: Secondary | ICD-10-CM | POA: Diagnosis not present

## 2019-05-11 DIAGNOSIS — Z95828 Presence of other vascular implants and grafts: Secondary | ICD-10-CM

## 2019-05-11 DIAGNOSIS — Z171 Estrogen receptor negative status [ER-]: Secondary | ICD-10-CM

## 2019-05-11 DIAGNOSIS — Z923 Personal history of irradiation: Secondary | ICD-10-CM | POA: Insufficient documentation

## 2019-05-11 DIAGNOSIS — I10 Essential (primary) hypertension: Secondary | ICD-10-CM | POA: Diagnosis not present

## 2019-05-11 DIAGNOSIS — E7800 Pure hypercholesterolemia, unspecified: Secondary | ICD-10-CM | POA: Insufficient documentation

## 2019-05-11 LAB — COMPREHENSIVE METABOLIC PANEL
ALT: 39 U/L (ref 0–44)
AST: 74 U/L — ABNORMAL HIGH (ref 15–41)
Albumin: 3.8 g/dL (ref 3.5–5.0)
Alkaline Phosphatase: 76 U/L (ref 38–126)
Anion gap: 15 (ref 5–15)
BUN: 12 mg/dL (ref 6–20)
CO2: 23 mmol/L (ref 22–32)
Calcium: 9.8 mg/dL (ref 8.9–10.3)
Chloride: 101 mmol/L (ref 98–111)
Creatinine, Ser: 0.89 mg/dL (ref 0.44–1.00)
GFR calc Af Amer: >60 mL/min (ref >60)
GFR calc non Af Amer: >60 mL/min (ref >60)
Glucose, Bld: 161 mg/dL — ABNORMAL HIGH (ref 70–99)
Potassium: 3.1 mmol/L — ABNORMAL LOW (ref 3.5–5.1)
Sodium: 139 mmol/L (ref 135–145)
Total Bilirubin: 0.3 mg/dL (ref 0.3–1.2)
Total Protein: 7.5 g/dL (ref 6.5–8.1)

## 2019-05-11 LAB — CBC WITH DIFFERENTIAL/PLATELET
Abs Immature Granulocytes: 0.03 10*3/uL (ref 0.00–0.07)
Basophils Absolute: 0 10*3/uL (ref 0.0–0.1)
Basophils Relative: 1 %
Eosinophils Absolute: 0.8 10*3/uL — ABNORMAL HIGH (ref 0.0–0.5)
Eosinophils Relative: 9 %
HCT: 35.8 % — ABNORMAL LOW (ref 36.0–46.0)
Hemoglobin: 11.2 g/dL — ABNORMAL LOW (ref 12.0–15.0)
Immature Granulocytes: 0 %
Lymphocytes Relative: 21 %
Lymphs Abs: 1.8 10*3/uL (ref 0.7–4.0)
MCH: 32 pg (ref 26.0–34.0)
MCHC: 31.3 g/dL (ref 30.0–36.0)
MCV: 102.3 fL — ABNORMAL HIGH (ref 80.0–100.0)
Monocytes Absolute: 0.7 10*3/uL (ref 0.1–1.0)
Monocytes Relative: 8 %
Neutro Abs: 5.3 10*3/uL (ref 1.7–7.7)
Neutrophils Relative %: 61 %
Platelets: 343 10*3/uL (ref 150–400)
RBC: 3.5 MIL/uL — ABNORMAL LOW (ref 3.87–5.11)
RDW: 14.9 % (ref 11.5–15.5)
WBC: 8.7 10*3/uL (ref 4.0–10.5)
nRBC: 0 % (ref 0.0–0.2)

## 2019-05-11 MED ORDER — HEPARIN SOD (PORK) LOCK FLUSH 100 UNIT/ML IV SOLN
500.0000 [IU] | Freq: Once | INTRAVENOUS | Status: AC
  Administered 2019-05-11: 500 [IU]
  Filled 2019-05-11: qty 5

## 2019-05-11 MED ORDER — SODIUM CHLORIDE 0.9% FLUSH
10.0000 mL | Freq: Once | INTRAVENOUS | Status: AC
  Administered 2019-05-11: 10 mL
  Filled 2019-05-11: qty 10

## 2019-05-12 LAB — TSH: TSH: 4.611 u[IU]/mL — ABNORMAL HIGH (ref 0.308–3.960)

## 2019-05-18 ENCOUNTER — Ambulatory Visit: Payer: 59 | Attending: Plastic Surgery | Admitting: Physical Therapy

## 2019-05-18 ENCOUNTER — Other Ambulatory Visit: Payer: Self-pay

## 2019-05-18 DIAGNOSIS — M25611 Stiffness of right shoulder, not elsewhere classified: Secondary | ICD-10-CM | POA: Diagnosis present

## 2019-05-18 DIAGNOSIS — M6281 Muscle weakness (generalized): Secondary | ICD-10-CM | POA: Insufficient documentation

## 2019-05-18 DIAGNOSIS — R293 Abnormal posture: Secondary | ICD-10-CM | POA: Diagnosis present

## 2019-05-18 DIAGNOSIS — Z483 Aftercare following surgery for neoplasm: Secondary | ICD-10-CM | POA: Insufficient documentation

## 2019-05-18 DIAGNOSIS — M25612 Stiffness of left shoulder, not elsewhere classified: Secondary | ICD-10-CM | POA: Diagnosis present

## 2019-05-18 NOTE — Therapy (Signed)
[place], [place], [address] Phone: [phone]   Fax:  [phone]  Physical Therapy Evaluation  Patient Details  Name: Alexandria Bradley MRN: [id number] Date of Birth: [birth year] Referring Provider (PT): Dr. Iran Planas    Encounter Date: 05/18/2019  PT End of Session - 05/18/19 1651    Visit Number  1    Number of Visits  17    Date for PT Re-Evaluation  07/16/19    PT Start Time  7035    PT Stop Time  1615    PT Time Calculation (min)  45 min    Activity Tolerance  Patient tolerated treatment well    Behavior During Therapy  [facility] for tasks assessed/performed       Past Medical History:  Diagnosis Date  . Anxiety   . Arthritis   . Back pain   . Breast cancer ([place])    left  . Fatty liver   . GERD (gastroesophageal reflux disease)   . Headache(784.0)    PMH : migraines  . History of kidney stones   . Hypercholesterolemia   . Hypertension   . Hypothyroidism   . Kidney stones   . Osteoporosis   . Personal history of chemotherapy   . Personal history of radiation therapy   . PONV (postoperative nausea and vomiting)    difficult to wake up  . Pre-diabetes   . Psoriasis   . PVC's (premature ventricular contractions)   . Radiation 08/18/14-10/07/14   Left breast    Past Surgical History:  Procedure Laterality Date  . BREAST BIOPSY Left 11/10/2018  . BREAST LUMPECTOMY Left 2015  . BREAST LUMPECTOMY WITH AXILLARY LYMPH NODE BIOPSY  6/15   left  . BREAST RECONSTRUCTION WITH PLACEMENT OF TISSUE EXPANDER AND ALLODERM Bilateral 04/12/2019   Procedure: BILATERAL BREAST RECONSTRUCTION WITH PLACEMENT OF TISSUE EXPANDERS; ALLODERM TO RIGHT CHEST;  Surgeon: Irene Limbo, MD;  Location: [facility];  Service: Plastics;  Laterality: Bilateral;  . CARDIAC CATHETERIZATION    . CHOLECYSTECTOMY    . COLONOSCOPY N/A 06/27/2014   Procedure: COLONOSCOPY;  Surgeon: Lear Ng, MD;  Location: [facility]  ENDOSCOPY;  Service: Endoscopy;  Laterality: N/A;  . LATISSIMUS FLAP TO BREAST Left 04/12/2019   Procedure: LEFT LATISSIMUS DORSI FLAP TO LEFT CHEST;  Surgeon: Irene Limbo, MD;  Location: [facility];  Service: Plastics;  Laterality: Left;  . LEFT HEART CATH AND CORONARY ANGIOGRAPHY N/A 07/24/2018   Procedure: LEFT HEART CATH AND CORONARY ANGIOGRAPHY;  Surgeon: Troy Sine, MD;  Location: [facility];  Service: Cardiovascular;  Laterality: N/A;  . LIPOMA EXCISION    . MASTECTOMY W/ SENTINEL NODE BIOPSY Bilateral 04/12/2019   Procedure: RIGHT BREAST RISK REDUCING MASTECTOMY; LEFT MASTECTOMY WITH LEFT AXILLARY SENTINEL LYMPH NODE BIOPSY WITH BLUE DYE INJECTION;  Surgeon: [name], MD;  Location: [facility];  Service: General;  Laterality: Bilateral;  . MYOMECTOMY    . PORT-A-CATH REMOVAL N/A 02/03/2015   Procedure: REMOVAL PORT-A-CATH;  Surgeon: [name], MD;  Location: [facility];  Service: General;  Laterality: N/A;  . PORTACATH PLACEMENT N/A 03/01/2014   Procedure: INSERTION PORT-A-CATH;  Surgeon: [name], MD;  Location: [facility];  Service: General;  Laterality: N/A;  . PORTACATH PLACEMENT N/A 11/18/2018   Procedure: INSERTION PORT-A-CATH WITH ULTRASOUND;  Surgeon: [name], MD;  Location: [facility];  Service: General;  Laterality: N/A;  . TONSILLECTOMY    . TUBAL LIGATION  There were no vitals filed for this visit.   Subjective Assessment - 05/18/19 1547    Subjective  Pt tells me she has pulling down the left arm She thinks everything is getting better, but she still doesn't have much stamina. She has gotten to the point that she can shower herself.    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 2020  and had chemo, underwent bilateral mastectomy on April 12, 2019 with immediate latt flap reconstruction on the left with expanders on both sides.( only lymph node removed now and one in  2015)  She has plans for implants in the future.  She plans to start [facility] in September.   Past history includes left breast cancer in 2015 with lumpectomy with one node removed. and radiation History of vertigo         [facility] PT Assessment - 05/18/19 0001      Assessment   Medical Diagnosis  left breast cancer     Referring Provider (PT)  Dr. Iran Planas     Onset Date/Surgical Date  04/12/19    Hand Dominance  Right   writes with right, but does a lot with left hand      Precautions   Precautions  Other (comment)    Precaution Comments  lymphedema risk       Restrictions   Weight Bearing Restrictions  No      Balance Screen   Has the patient fallen in the past 6 months  No    Has the patient had a decrease in activity level because of a fear of falling?   No    Is the patient reluctant to leave their home because of a fear of falling?   No      Home Environment   Living Environment  Private residence    Living Arrangements  Spouse/significant other;Children   31 yo grandson    Available Help at Discharge  Available 24 hours/day      Prior Function   Level of Independence  Independent    Vocation  Unemployed    Vocation Requirements  was working the Engineer, petroleum at a dentist's office, not sure if she will go back     Leisure  before surgery she was doing treadmill , wants to get back to it       Cognition   Overall Cognitive Status  Within Functional Limits for tasks assessed      Observation/Other Assessments   Observations  pt comes in holding left arm in protective position. She has healing incisions on both sides of anterior chest with expanders.there is dark eschar on both incisions. left side has larger more curved incision that is closed, but skin appears more fragile.  fullness in left upper chest Incision and above incision on back.       Quick DASH   50      Coordination   Gross Motor Movements are Fluid and Coordinated  No   limited by pain       Posture/Postural Control   Posture/Postural Control  Postural limitations    Postural Limitations  Rounded Shoulders;Forward head;Increased thoracic kyphosis      ROM / Strength   AROM / PROM / Strength  AROM;Strength      AROM   Right/Left Shoulder  Right;Left    Right Shoulder Flexion  130 Degrees    Right Shoulder ABduction  135 Degrees    Left Shoulder Flexion  60  Degrees   limited by pain    Left Shoulder ABduction  50 Degrees   limited by pain    Left Shoulder External Rotation  30 Degrees   limted by pain      Strength   Overall Strength  Deficits    Overall Strength Comments  pt appears to have generalized muscle atrophy and general deconditioning   all shoulder movments ar limited by pain.  Pt  reports pain down into left eblow as she tries to straighten our her elbow     Strength Assessment Site  Shoulder    Right/Left Shoulder  Right;Left    Right Shoulder Flexion  3-/5    Right Shoulder ABduction  3-/5    Left Shoulder Flexion  3-/5    Left Shoulder ABduction  3-/5      Palpation   Palpation comment  very tight and tender at right lateral chest and axilla, unable to even touch in left axilla due to pain         LYMPHEDEMA/ONCOLOGY QUESTIONNAIRE - 05/18/19 1602      Surgeries   Mastectomy Date  04/12/19      Right Upper Extremity Lymphedema   10 cm Proximal to Olecranon Process  29 cm    Olecranon Process  25 cm    10 cm Proximal to Ulnar Styloid Process  19.5 cm    Just Proximal to Ulnar Styloid Process  14.5 cm    Across Hand at [employer]  19.5 cm    At [place] of 2nd Digit  6 cm      Left Upper Extremity Lymphedema   10 cm Proximal to Olecranon Process  29.5 cm    Olecranon Process  25.5 cm    10 cm Proximal to Ulnar Styloid Process  21 cm    Just Proximal to Ulnar Styloid Process  15 cm    Across Hand at [employer]  19 cm    At [place] of 2nd Digit  6 cm          Quick Dash - 05/18/19 0001    Open a tight or new jar  Moderate difficulty     Do heavy household chores (wash walls, wash floors)  Moderate difficulty    Carry a shopping bag or briefcase  No difficulty    Wash your back  Moderate difficulty    Use a knife to cut food  No difficulty    Recreational activities in which you take some force or impact through your arm, shoulder, or hand (golf, hammering, tennis)  Unable    During the past week, to what extent has your arm, shoulder or hand problem interfered with your normal social activities with family, friends, neighbors, or groups?  Quite a bit    During the past week, to what extent has your arm, shoulder or hand problem limited your work or other regular daily activities  Quite a bit    Arm, shoulder, or hand pain.  Moderate    Tingling (pins and needles) in your arm, shoulder, or hand  Moderate    Difficulty Sleeping  Moderate difficulty    DASH Score  50 %        Objective measurements completed on examination: See above findings.      [facility] Adult PT Treatment/Exercise - 05/18/19 0001      Self-Care   Self-Care  Other Self-Care Comments    Other Self-Care Comments   encouraged pt to wear pink  binder with straps to hold it up especially at left anterior chest over full area.  Issued large TG soft doulbed over on upper arm to provide gentle support to lymphatics and possible axillary cording. instructed in supine dowel flexion but pt only able to do about 45 degress without shoulder shrug.                PT Short Term Goals - 05/18/19 1700      PT SHORT TERM GOAL #1   Title  Pt will have 150 degrees of right shoulder flexion and abduction so that she can perfom ADLs easier    Time  4    Period  Weeks    Status  New      PT SHORT TERM GOAL #2   Title  Pt will have 90 degrees of painfree  left shoulder flexion and abduction so that she can perform self care easier    Time  4    Period  Weeks    Status  New      PT SHORT TERM GOAL #3   Title  Pt will be indepenedent in a basic HEP for  shoulder ROM    Time  4    Period  Weeks    Status  New        PT Long Term Goals - 05/18/19 1703      PT LONG TERM GOAL #1   Title  Pt will have 140  degrees of painfree  left shoulder flexion and abduction so that she can perform self care easier    Time  8    Period  Weeks    Status  New      PT LONG TERM GOAL #2   Title  Pt will report the "pulling" in her left arm is decreased by 75%    Time  8    Period  Weeks    Status  New      PT LONG TERM GOAL #3   Title  Pt will verbalize lymphedema risk reduction practices    Time  8    Period  Weeks    Status  New      PT LONG TERM GOAL #4   Title  Pt will be independent in a HEP for strengthening so that she can return to normal activities    Time  8    Period  Weeks    Status  New      PT LONG TERM GOAL #5   Title  Pt will decrease Quick DASH score to < 20 indicating an improvement in functional use of UE    Baseline  50    Time  8    Period  Weeks    Status  New             Plan - 05/18/19 1651    Clinical Impression Statement  58 yo female with recurrance of breast cancer underwent bilateral mastectomy with immediate expander placement ( lat flap on left ) she has chemo before surgery and will be starting Keytruda.  She has previous radiation on the left side so will not be having radiation now.She is markedly limited in left shoulder ROM and function by pain that pulls down into her left forearm ( likely axillary cording but no cords palpable at this time ) she has fullness around all incision sites.    Personal Factors and Comorbidities  Past/Current Experience;Comorbidity 3+    Comorbidities  previous breast cancer with lumpectomy and radiation, one node removed in 2015 and one removed in 2020, current reconstruction process wit lat flap on the left    Examination-Activity Limitations  Bathing;Caring for Others    Stability/Clinical Decision Making  Evolving/Moderate complexity    Clinical Decision Making   Moderate    Rehab Potential  Good    PT Frequency  2x / week    PT Duration  8 weeks    PT Treatment/Interventions  ADLs/Self Care Home Management;Moist Heat;DME Instruction;Functional mobility training;Therapeutic activities;Therapeutic exercise;Patient/family education;Orthotic Fit/Training;Manual techniques;Manual lymph drainage;Passive range of motion;Scar mobilization;Compression bandaging   pt sensitive to tapes, so no taping   PT Next Visit Plan  see how tg soft helped, manual lymph drainage and gentle ROM to both shoulders and left arm , chip packs as needed  progress HEP    PT Home Exercise Plan  supine dowel    Consulted and Agree with Plan of Care  Patient       Patient will benefit from skilled therapeutic intervention in order to improve the following deficits and impairments:  Decreased activity tolerance, Decreased knowledge of use of DME, Decreased safety awareness, Decreased strength, Increased fascial restricitons, Impaired flexibility, Impaired UE functional use, Postural dysfunction, Pain, Impaired perceived functional ability, Increased edema, Decreased scar mobility, Decreased range of motion, Decreased knowledge of precautions  Visit Diagnosis: 1. Aftercare following surgery for neoplasm   2. Stiffness of left shoulder, not elsewhere classified   3. Stiffness of right shoulder, not elsewhere classified   4. Muscle weakness (generalized)   5. Abnormal posture        Problem List Patient Active Problem List   Diagnosis Date Noted  . Port-A-Cath in place 11/26/2018  . Recurrent cancer of left breast ([place]) 11/13/2018  . Neuropathy due to chemotherapeutic drug ([place]) 11/13/2018  . Chest tightness   . Vertigo 05/28/2018  . Prediabetes 05/28/2018  . OSA (obstructive sleep apnea) 08/20/2016  . Snoring 08/20/2016  . Adjustment disorder with mixed anxiety and depressed mood 08/20/2016  . Genetic testing 07/07/2015  . Family history of uterine cancer   . Family  history of bladder cancer   . Abnormal AST and ALT 07/08/2014  . Fatty infiltration of liver 07/08/2014  . RUQ pain 06/27/2014  . Nonspecific (abnormal) findings on radiological and other examination of gastrointestinal tract 06/27/2014  . Fever 06/15/2014  . Diarrhea 06/10/2014  . Chemotherapy-induced nausea 06/10/2014  . Abdominal pain 06/10/2014  . Drug induced neutropenia(288.03) 04/21/2014  . Malignant neoplasm of upper-inner quadrant of left breast in female, estrogen receptor negative ([place]) 02/11/2014  . OTHER CHEST PAIN 05/21/2010  . HYPERLIPIDEMIA 05/11/2010  . PSORIASIS 05/11/2010  . PALPITATIONS 05/11/2010  . Nonspecific (abnormal) findings on radiological and other examination of body structure 05/11/2010  . CHEST XRAY, ABNORMAL 05/11/2010  . HYPOTHYROIDISM 05/10/2010  . EPIGASTRIC PAIN 05/10/2010  . CONJUNCTIVITIS, ALLERGIC 02/18/2008  . MALAISE AND FATIGUE 01/11/2008  . Essential hypertension 09/02/2007  . SINUSITIS- ACUTE-NOS 09/02/2007  . Acute upper respiratory infection 09/02/2007  . Allergic rhinitis, cause unspecified 09/02/2007   Donato Heinz. Owens Shark PT  Norwood Levo 05/18/2019, 5:08 PM  [place], [place], [address] Phone: [phone]   Fax:  [phone]  Name: ANAVEY COOMBES MRN: [id number] Date of Birth: Jun 20, 1961

## 2019-05-19 ENCOUNTER — Encounter: Payer: 59 | Admitting: Physical Therapy

## 2019-05-26 ENCOUNTER — Ambulatory Visit: Payer: 59

## 2019-05-26 DIAGNOSIS — Z483 Aftercare following surgery for neoplasm: Secondary | ICD-10-CM | POA: Diagnosis not present

## 2019-05-26 DIAGNOSIS — M6281 Muscle weakness (generalized): Secondary | ICD-10-CM

## 2019-05-26 DIAGNOSIS — M25612 Stiffness of left shoulder, not elsewhere classified: Secondary | ICD-10-CM

## 2019-05-26 DIAGNOSIS — M25611 Stiffness of right shoulder, not elsewhere classified: Secondary | ICD-10-CM

## 2019-05-26 DIAGNOSIS — R293 Abnormal posture: Secondary | ICD-10-CM

## 2019-05-26 NOTE — Therapy (Signed)
[place], [place], [address] Phone: [phone]   Fax:  [phone]  Physical Therapy Treatment  Patient Details  Name: Alexandria Bradley MRN: [id number] Date of Birth: 07-22-1961 Referring Provider (PT): Dr. Iran Planas    Encounter Date: 05/26/2019  PT End of Session - 05/26/19 1056    Visit Number  2    Number of Visits  17    Date for PT Re-Evaluation  07/16/19    PT Start Time  1004    PT Stop Time  1051    PT Time Calculation (min)  47 min    Activity Tolerance  Patient tolerated treatment well    Behavior During Therapy  [facility] for tasks assessed/performed       Past Medical History:  Diagnosis Date  . Anxiety   . Arthritis   . Back pain   . Breast cancer ([place])    left  . Fatty liver   . GERD (gastroesophageal reflux disease)   . Headache(784.0)    PMH : migraines  . History of kidney stones   . Hypercholesterolemia   . Hypertension   . Hypothyroidism   . Kidney stones   . Osteoporosis   . Personal history of chemotherapy   . Personal history of radiation therapy   . PONV (postoperative nausea and vomiting)    difficult to wake up  . Pre-diabetes   . Psoriasis   . PVC's (premature ventricular contractions)   . Radiation 08/18/14-10/07/14   Left breast    Past Surgical History:  Procedure Laterality Date  . BREAST BIOPSY Left 11/10/2018  . BREAST LUMPECTOMY Left 2015  . BREAST LUMPECTOMY WITH AXILLARY LYMPH NODE BIOPSY  6/15   left  . BREAST RECONSTRUCTION WITH PLACEMENT OF TISSUE EXPANDER AND ALLODERM Bilateral 04/12/2019   Procedure: BILATERAL BREAST RECONSTRUCTION WITH PLACEMENT OF TISSUE EXPANDERS; ALLODERM TO RIGHT CHEST;  Surgeon: Irene Limbo, MD;  Location: [place];  Service: Plastics;  Laterality: Bilateral;  . CARDIAC CATHETERIZATION    . CHOLECYSTECTOMY    . COLONOSCOPY N/A 06/27/2014   Procedure: COLONOSCOPY;  Surgeon: Lear Ng, MD;  Location: [facility]  ENDOSCOPY;  Service: Endoscopy;  Laterality: N/A;  . LATISSIMUS FLAP TO BREAST Left 04/12/2019   Procedure: LEFT LATISSIMUS DORSI FLAP TO LEFT CHEST;  Surgeon: Irene Limbo, MD;  Location: [place];  Service: Plastics;  Laterality: Left;  . LEFT HEART CATH AND CORONARY ANGIOGRAPHY N/A 07/24/2018   Procedure: LEFT HEART CATH AND CORONARY ANGIOGRAPHY;  Surgeon: Troy Sine, MD;  Location: [place] CV LAB;  Service: Cardiovascular;  Laterality: N/A;  . LIPOMA EXCISION    . MASTECTOMY W/ SENTINEL NODE BIOPSY Bilateral 04/12/2019   Procedure: RIGHT BREAST RISK REDUCING MASTECTOMY; LEFT MASTECTOMY WITH LEFT AXILLARY SENTINEL LYMPH NODE BIOPSY WITH BLUE DYE INJECTION;  Surgeon: [name], MD;  Location: [place];  Service: General;  Laterality: Bilateral;  . MYOMECTOMY    . PORT-A-CATH REMOVAL N/A 02/03/2015   Procedure: REMOVAL PORT-A-CATH;  Surgeon: [name], MD;  Location: [place];  Service: General;  Laterality: N/A;  . PORTACATH PLACEMENT N/A 03/01/2014   Procedure: INSERTION PORT-A-CATH;  Surgeon: [name], MD;  Location: [place];  Service: General;  Laterality: N/A;  . PORTACATH PLACEMENT N/A 11/18/2018   Procedure: INSERTION PORT-A-CATH WITH ULTRASOUND;  Surgeon: [name], MD;  Location: [place];  Service: General;  Laterality: N/A;  . TONSILLECTOMY    . TUBAL LIGATION  There were no vitals filed for this visit.  Subjective Assessment - 05/26/19 1007    Subjective  I'v ebeen doing the dowel exercises Alexandria Bradley gave me last time and I can tell they are getting better though I don't push ti too far over my head bc of the Lt cording discomfort. I've started wearing my compression bras from when I had cancer in 2015. I'm still wearing the velcro binder but it starts to irritate my ribs so I have to remove it after awhile.    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  She plans to start [facility] in September.   Past history includes left breast cancer in 2015 with lumpectomy with one node removed. and radiation History of vertigo    Currently in Pain?  No/denies                       [facility] Adult PT Treatment/Exercise - 05/26/19 0001      Shoulder Exercises: Supine   Horizontal ABduction  AAROM;Left;5 reps   3 sec holds with dowel   Horizontal ABduction Limitations  VCs for correct technique and how to progress into ebduction once she is able    Flexion  AAROM;Both;5 reps   5 sec holds with dowel   Flexion Limitations  reviewed these with pt today and required VCs to try to keep elbows straight and relax shoulders, especially at end ROM      Manual Therapy   Manual Therapy  Myofascial release;Manual Lymphatic Drainage (MLD);Passive ROM    Myofascial Release  To Lt > Rt axilla (and at lateral trunk and areas of visible cording superior to breast on Lt) during P/ROM, could not tolerate UE pulling today with P/ROM    Passive ROM  In Supine: to Lt>Rt shoulders into flexion, abduction, D2 and er to pts tolerance; multiple VCs to relax throughout and she tolerated stretches better with elbow flexion at end ROM's               PT Short Term Goals - 05/18/19 1700      PT SHORT TERM GOAL #1   Title  Pt will have 150 degrees of right shoulder flexion and abduction so that she can perfom ADLs easier    Time  4    Period  Weeks    Status  New      PT SHORT TERM GOAL #2   Title  Pt will have 90 degrees of painfree  left shoulder flexion and abduction so that she can perform self care easier    Time  4    Period  Weeks    Status  New      PT SHORT TERM GOAL #3   Title  Pt will be indepenedent in a basic HEP for shoulder  ROM    Time  4    Period  Weeks    Status  New        PT Long Term Goals - 05/18/19 1703      PT LONG TERM GOAL #1   Title  Pt will have 140  degrees of painfree  left shoulder flexion and abduction so that she can perform self  care easier    Time  8    Period  Weeks    Status  New      PT LONG TERM GOAL #2   Title  Pt will report the "pulling" in her left arm is decreased by 75%    Time  8    Period  Weeks    Status  New      PT LONG TERM GOAL #3   Title  Pt will verbalize lymphedema risk reduction practices    Time  8    Period  Weeks    Status  New      PT LONG TERM GOAL #4   Title  Pt will be independent in a HEP for strengthening so that she can return to normal activities    Time  8    Period  Weeks    Status  New      PT LONG TERM GOAL #5   Title  Pt will decrease Quick DASH score to < 20 indicating an improvement in functional use of UE    Baseline  50    Time  8    Period  Weeks    Status  New            Plan - 05/26/19 1057    Clinical Impression Statement  First session of manual therapy. Pt overall tolerated this well, though did require max VCs during P/ROM. She was able to relax but continued to fall back into guarded posture unintentionally. This was improved and less noticeable by end of session. Briefly reviewed dowel exercises which she was doing in standing so instructed in supine and pt reports she tolerated this better and after P/ROM today was able to go further with less discomfort. Also encouraged pt to strtch until she feels good stretch and slight discomfort as she was stopping before this point. Just instructed she shouldn't be feeling pain. Pt verbalized understading all and reports feeling much better at end of session.    Personal Factors and Comorbidities  Past/Current Experience;Comorbidity 3+    Comorbidities  previous breast cancer with lumpectomy and radiation, one node removed in 2015 and one removed in 2020, current  reconstruction process wit lat flap on the left    Examination-Activity Limitations  Bathing;Caring for Others    Stability/Clinical Decision Making  Evolving/Moderate complexity    Rehab Potential  Good    PT Frequency  2x / week    PT Duration  8 weeks    PT Treatment/Interventions  ADLs/Self Care Home Management;Moist Heat;DME Instruction;Functional mobility training;Therapeutic activities;Therapeutic exercise;Patient/family education;Orthotic Fit/Training;Manual techniques;Manual lymph drainage;Passive range of motion;Scar mobilization;Compression bandaging   no taping due to sensitivity   PT Next Visit Plan  see how tg soft helped, manual lymph drainage and gentle ROM to both shoulders and left arm , chip packs as needed  progress HEP    PT Home Exercise Plan  supine dowel    Consulted and Agree with Plan of Care  Patient       Patient will benefit from skilled therapeutic intervention in order to improve the following deficits and impairments:  Decreased activity tolerance, Decreased knowledge of use of DME, Decreased safety awareness, Decreased strength, Increased fascial restricitons, Impaired flexibility, Impaired UE functional use, Postural dysfunction, Pain, Impaired perceived functional ability, Increased edema, Decreased scar mobility, Decreased range of motion, Decreased knowledge of precautions  Visit Diagnosis: Aftercare following surgery for neoplasm  Stiffness of left shoulder, not elsewhere classified  Stiffness  of right shoulder, not elsewhere classified  Muscle weakness (generalized)  Abnormal posture     Problem List Patient Active Problem List   Diagnosis Date Noted  . Port-A-Cath in place 11/26/2018  . Recurrent cancer of left breast ([place]) 11/13/2018  . Neuropathy due to chemotherapeutic drug ([place]) 11/13/2018  . Chest tightness   . Vertigo 05/28/2018  . Prediabetes 05/28/2018  . OSA (obstructive sleep apnea) 08/20/2016  . Snoring 08/20/2016  .  Adjustment disorder with mixed anxiety and depressed mood 08/20/2016  . Genetic testing 07/07/2015  . Family history of uterine cancer   . Family history of bladder cancer   . Abnormal AST and ALT 07/08/2014  . Fatty infiltration of liver 07/08/2014  . RUQ pain 06/27/2014  . Nonspecific (abnormal) findings on radiological and other examination of gastrointestinal tract 06/27/2014  . Fever 06/15/2014  . Diarrhea 06/10/2014  . Chemotherapy-induced nausea 06/10/2014  . Abdominal pain 06/10/2014  . Drug induced neutropenia(288.03) 04/21/2014  . Malignant neoplasm of upper-inner quadrant of left breast in female, estrogen receptor negative ([place]) 02/11/2014  . OTHER CHEST PAIN 05/21/2010  . HYPERLIPIDEMIA 05/11/2010  . PSORIASIS 05/11/2010  . PALPITATIONS 05/11/2010  . Nonspecific (abnormal) findings on radiological and other examination of body structure 05/11/2010  . CHEST XRAY, ABNORMAL 05/11/2010  . HYPOTHYROIDISM 05/10/2010  . EPIGASTRIC PAIN 05/10/2010  . CONJUNCTIVITIS, ALLERGIC 02/18/2008  . MALAISE AND FATIGUE 01/11/2008  . Essential hypertension 09/02/2007  . SINUSITIS- ACUTE-NOS 09/02/2007  . Acute upper respiratory infection 09/02/2007  . Allergic rhinitis, cause unspecified 09/02/2007    Otelia Limes, PTA 05/26/2019, 11:01 AM  [place], [place], [address] Phone: [phone]   Fax:  [phone]  Name: Alexandria Bradley MRN: [id number] Date of Birth: 1961-08-18

## 2019-05-27 ENCOUNTER — Ambulatory Visit: Payer: 59 | Admitting: Physical Therapy

## 2019-05-27 ENCOUNTER — Other Ambulatory Visit: Payer: Self-pay

## 2019-05-27 ENCOUNTER — Encounter: Payer: Self-pay | Admitting: Physical Therapy

## 2019-05-27 DIAGNOSIS — M6281 Muscle weakness (generalized): Secondary | ICD-10-CM

## 2019-05-27 DIAGNOSIS — M25611 Stiffness of right shoulder, not elsewhere classified: Secondary | ICD-10-CM

## 2019-05-27 DIAGNOSIS — R293 Abnormal posture: Secondary | ICD-10-CM

## 2019-05-27 DIAGNOSIS — Z483 Aftercare following surgery for neoplasm: Secondary | ICD-10-CM | POA: Diagnosis not present

## 2019-05-27 DIAGNOSIS — M25612 Stiffness of left shoulder, not elsewhere classified: Secondary | ICD-10-CM

## 2019-05-27 NOTE — Therapy (Signed)
[place], [place], [address] Phone: [phone]   Fax:  [phone]  Physical Therapy Treatment  Patient Details  Name: Alexandria Bradley MRN: [id number] Date of Birth: 10-31-1960 Referring Provider (PT): Dr. Iran Planas    Encounter Date: 05/27/2019  PT End of Session - 05/27/19 1056    Visit Number  3    Number of Visits  17    Date for PT Re-Evaluation  07/16/19    PT Start Time  1000    PT Stop Time  1045    PT Time Calculation (min)  45 min    Activity Tolerance  Patient tolerated treatment well    Behavior During Therapy  [facility] for tasks assessed/performed       Past Medical History:  Diagnosis Date  . Anxiety   . Arthritis   . Back pain   . Breast cancer ([place])    left  . Fatty liver   . GERD (gastroesophageal reflux disease)   . Headache(784.0)    PMH : migraines  . History of kidney stones   . Hypercholesterolemia   . Hypertension   . Hypothyroidism   . Kidney stones   . Osteoporosis   . Personal history of chemotherapy   . Personal history of radiation therapy   . PONV (postoperative nausea and vomiting)    difficult to wake up  . Pre-diabetes   . Psoriasis   . PVC's (premature ventricular contractions)   . Radiation 08/18/14-10/07/14   Left breast    Past Surgical History:  Procedure Laterality Date  . BREAST BIOPSY Left 11/10/2018  . BREAST LUMPECTOMY Left 2015  . BREAST LUMPECTOMY WITH AXILLARY LYMPH NODE BIOPSY  6/15   left  . BREAST RECONSTRUCTION WITH PLACEMENT OF TISSUE EXPANDER AND ALLODERM Bilateral 04/12/2019   Procedure: BILATERAL BREAST RECONSTRUCTION WITH PLACEMENT OF TISSUE EXPANDERS; ALLODERM TO RIGHT CHEST;  Surgeon: Irene Limbo, MD;  Location: [place];  Service: Plastics;  Laterality: Bilateral;  . CARDIAC CATHETERIZATION    . CHOLECYSTECTOMY    . COLONOSCOPY N/A 06/27/2014   Procedure: COLONOSCOPY;  Surgeon: Lear Ng, MD;  Location: [facility]  ENDOSCOPY;  Service: Endoscopy;  Laterality: N/A;  . LATISSIMUS FLAP TO BREAST Left 04/12/2019   Procedure: LEFT LATISSIMUS DORSI FLAP TO LEFT CHEST;  Surgeon: Irene Limbo, MD;  Location: [place];  Service: Plastics;  Laterality: Left;  . LEFT HEART CATH AND CORONARY ANGIOGRAPHY N/A 07/24/2018   Procedure: LEFT HEART CATH AND CORONARY ANGIOGRAPHY;  Surgeon: Troy Sine, MD;  Location: [place] CV LAB;  Service: Cardiovascular;  Laterality: N/A;  . LIPOMA EXCISION    . MASTECTOMY W/ SENTINEL NODE BIOPSY Bilateral 04/12/2019   Procedure: RIGHT BREAST RISK REDUCING MASTECTOMY; LEFT MASTECTOMY WITH LEFT AXILLARY SENTINEL LYMPH NODE BIOPSY WITH BLUE DYE INJECTION;  Surgeon: [name], MD;  Location: [place];  Service: General;  Laterality: Bilateral;  . MYOMECTOMY    . PORT-A-CATH REMOVAL N/A 02/03/2015   Procedure: REMOVAL PORT-A-CATH;  Surgeon: [name], MD;  Location: [place];  Service: General;  Laterality: N/A;  . PORTACATH PLACEMENT N/A 03/01/2014   Procedure: INSERTION PORT-A-CATH;  Surgeon: [name], MD;  Location: [place];  Service: General;  Laterality: N/A;  . PORTACATH PLACEMENT N/A 11/18/2018   Procedure: INSERTION PORT-A-CATH WITH ULTRASOUND;  Surgeon: [name], MD;  Location: [place];  Service: General;  Laterality: N/A;  . TONSILLECTOMY    . TUBAL LIGATION  There were no vitals filed for this visit.  Subjective Assessment - 05/27/19 1004    Subjective  Pt says she is feeling good today.  She felt very good after treatment with Val yesterday and felt that she could stand up straighter for the first time.  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  She plans to start [facility] in September.   Past history includes left breast cancer in 2015 with lumpectomy with one node removed. and radiation History of vertigo    Currently in Pain?  No/denies   pt did take tylenol this morning                      [facility] Adult PT Treatment/Exercise - 05/27/19 0001      Exercises   Exercises  Neck;Shoulder      Neck Exercises: Seated   Other Seated Exercise  neck and upper thoracic ROM       Shoulder Exercises: Supine   Protraction  Both;5 reps    Protraction Limitations  left elbow bent to prevent pulling from cording     Flexion  AROM;5 reps    Flexion Limitations  encouraged slow controlled movement     Other Supine Exercises  small circles with hand pointed to ceiling       Shoulder Exercises: Seated   Abduction  AROM;Left;5 reps    ABduction Limitations  with elbow bent and cues to keep shoulder down       Manual Therapy   Manual Therapy  Myofascial release;Manual Lymphatic Drainage (MLD)    Manual therapy comments  attempted soft tissue work to left scapular area, but pt extremely tender to touch here     Myofascial Release  prolonged pressure at tender area at left and right upper trap     Manual Lymphatic Drainage (MLD)  in supine, diaphragmatic breaths, brief stationary circles to left chest and and antirion shoulder and lateral trunk     Passive ROM  In Supine: to Lt>Rt shoulders into flexion, abduction, D2 and er to pts tolerance; multiple VCs to relax throughout and she tolerated stretches better with elbow flexion at end ROM's               PT Short Term Goals - 05/18/19 1700      PT SHORT TERM GOAL #1   Title  Pt will have 150 degrees of right shoulder flexion and abduction so that she can perfom ADLs easier    Time  4    Period  Weeks    Status  New      PT SHORT TERM GOAL #2   Title  Pt will have 90 degrees of painfree  left  shoulder flexion and abduction so that she can perform self care easier    Time  4    Period  Weeks    Status  New      PT SHORT TERM GOAL #3   Title  Pt will be indepenedent in a basic HEP for shoulder ROM    Time  4    Period  Weeks    Status  New        PT Long Term Goals - 05/18/19 1703      PT LONG TERM GOAL #1   Title  Pt will have 140  degrees of painfree  left shoulder flexion and abduction so that she can perform self care easier    Time  8    Period  Weeks    Status  New      PT LONG TERM GOAL #2   Title  Pt will report the "pulling" in her left arm is decreased by 75%    Time  8    Period  Weeks    Status  New      PT LONG TERM GOAL #3   Title  Pt will verbalize lymphedema risk reduction practices    Time  8    Period  Weeks    Status  New      PT LONG TERM GOAL #4   Title  Pt will be independent in a HEP for strengthening so that she can return to normal activities    Time  8    Period  Weeks    Status  New      PT LONG TERM GOAL #5   Title  Pt will decrease Quick DASH score to < 20 indicating an improvement in functional use of UE    Baseline  50    Time  8    Period  Weeks    Status  New            Plan - 05/27/19 1056    Clinical Impression Statement  Pt got good releif with last session and appears to be doing better though she still has pulling down into forearm for cording and is very sensitive to touch in left scapular area above incision.  She does better with active and passive motion with elbow bent for shoulder movment.    Rehab Potential  Good    PT Frequency  2x / week    PT Duration  8 weeks    PT Treatment/Interventions  ADLs/Self Care Home Management;Moist Heat;DME Instruction;Functional mobility training;Therapeutic activities;Therapeutic exercise;Patient/family education;Orthotic Fit/Training;Manual techniques;Manual lymph drainage;Passive range of motion;Scar mobilization;Compression bandaging    PT Next Visit Plan  consider  working in sitting for manual lymph drainage and gentle ROM to both shoulders and left arm , chip packs as needed  progress HEP    Consulted and Agree with Plan of Care  Patient       Patient will benefit from skilled therapeutic intervention in order to improve the following deficits and impairments:  Decreased activity tolerance, Decreased knowledge of use of DME, Decreased safety awareness, Decreased strength, Increased fascial restricitons, Impaired flexibility, Impaired UE functional use, Postural dysfunction, Pain, Impaired perceived functional ability, Increased edema, Decreased scar mobility, Decreased range of motion, Decreased knowledge of precautions  Visit Diagnosis: Aftercare following surgery for neoplasm  Stiffness of left shoulder, not elsewhere classified  Stiffness of right shoulder, not elsewhere classified  Muscle weakness (generalized)  Abnormal posture     Problem List Patient Active Problem List   Diagnosis Date Noted  . Port-A-Cath in place 11/26/2018  . Recurrent cancer of left breast ([place]) 11/13/2018  . Neuropathy due to chemotherapeutic drug ([place]) 11/13/2018  . Chest tightness   . Vertigo 05/28/2018  . Prediabetes 05/28/2018  . OSA (obstructive sleep apnea) 08/20/2016  . Snoring 08/20/2016  . Adjustment disorder with mixed anxiety and depressed mood 08/20/2016  . Genetic testing  07/07/2015  . Family history of uterine cancer   . Family history of bladder cancer   . Abnormal AST and ALT 07/08/2014  . Fatty infiltration of liver 07/08/2014  . RUQ pain 06/27/2014  . Nonspecific (abnormal) findings on radiological and other examination of gastrointestinal tract 06/27/2014  . Fever 06/15/2014  . Diarrhea 06/10/2014  . Chemotherapy-induced nausea 06/10/2014  . Abdominal pain 06/10/2014  . Drug induced neutropenia(288.03) 04/21/2014  . Malignant neoplasm of upper-inner quadrant of left breast in female, estrogen receptor negative ([place]) 02/11/2014  .  OTHER CHEST PAIN 05/21/2010  . HYPERLIPIDEMIA 05/11/2010  . PSORIASIS 05/11/2010  . PALPITATIONS 05/11/2010  . Nonspecific (abnormal) findings on radiological and other examination of body structure 05/11/2010  . CHEST XRAY, ABNORMAL 05/11/2010  . HYPOTHYROIDISM 05/10/2010  . EPIGASTRIC PAIN 05/10/2010  . CONJUNCTIVITIS, ALLERGIC 02/18/2008  . MALAISE AND FATIGUE 01/11/2008  . Essential hypertension 09/02/2007  . SINUSITIS- ACUTE-NOS 09/02/2007  . Acute upper respiratory infection 09/02/2007  . Allergic rhinitis, cause unspecified 09/02/2007   Donato Heinz. Owens Shark PT   Norwood Levo 05/27/2019, 10:59 AM  [place], [place], [address] Phone: [phone]   Fax:  [phone]  Name: VERTIS VALLIANT MRN: [id number] Date of Birth: 24-Feb-1961

## 2019-05-31 ENCOUNTER — Ambulatory Visit: Payer: 59

## 2019-05-31 ENCOUNTER — Other Ambulatory Visit: Payer: Self-pay

## 2019-05-31 DIAGNOSIS — Z483 Aftercare following surgery for neoplasm: Secondary | ICD-10-CM

## 2019-05-31 DIAGNOSIS — M25612 Stiffness of left shoulder, not elsewhere classified: Secondary | ICD-10-CM

## 2019-05-31 DIAGNOSIS — R293 Abnormal posture: Secondary | ICD-10-CM

## 2019-05-31 DIAGNOSIS — M25611 Stiffness of right shoulder, not elsewhere classified: Secondary | ICD-10-CM

## 2019-05-31 DIAGNOSIS — M6281 Muscle weakness (generalized): Secondary | ICD-10-CM

## 2019-05-31 NOTE — Therapy (Addendum)
[place], [place], [address] Phone: [phone]   Fax:  [phone]  Physical Therapy Treatment  Patient Details  Name: Alexandria Bradley MRN: [id number] Date of Birth: 1960/12/02 Referring Provider (PT): Dr. Iran Planas    Encounter Date: 05/31/2019  PT End of Session - 05/31/19 1529    Visit Number  4    Number of Visits  17    Date for PT Re-Evaluation  07/16/19    PT Start Time  8185    PT Stop Time  1520    PT Time Calculation (min)  49 min    Activity Tolerance  Patient tolerated treatment well    Behavior During Therapy  [facility] for tasks assessed/performed       Past Medical History:  Diagnosis Date  . Anxiety   . Arthritis   . Back pain   . Breast cancer ([place])    left  . Fatty liver   . GERD (gastroesophageal reflux disease)   . Headache(784.0)    PMH : migraines  . History of kidney stones   . Hypercholesterolemia   . Hypertension   . Hypothyroidism   . Kidney stones   . Osteoporosis   . Personal history of chemotherapy   . Personal history of radiation therapy   . PONV (postoperative nausea and vomiting)    difficult to wake up  . Pre-diabetes   . Psoriasis   . PVC's (premature ventricular contractions)   . Radiation 08/18/14-10/07/14   Left breast    Past Surgical History:  Procedure Laterality Date  . BREAST BIOPSY Left 11/10/2018  . BREAST LUMPECTOMY Left 2015  . BREAST LUMPECTOMY WITH AXILLARY LYMPH NODE BIOPSY  6/15   left  . BREAST RECONSTRUCTION WITH PLACEMENT OF TISSUE EXPANDER AND ALLODERM Bilateral 04/12/2019   Procedure: BILATERAL BREAST RECONSTRUCTION WITH PLACEMENT OF TISSUE EXPANDERS; ALLODERM TO RIGHT CHEST;  Surgeon: Irene Limbo, MD;  Location: [place];  Service: Plastics;  Laterality: Bilateral;  . CARDIAC CATHETERIZATION    . CHOLECYSTECTOMY    . COLONOSCOPY N/A 06/27/2014   Procedure: COLONOSCOPY;  Surgeon: Lear Ng, MD;  Location: [facility]  ENDOSCOPY;  Service: Endoscopy;  Laterality: N/A;  . LATISSIMUS FLAP TO BREAST Left 04/12/2019   Procedure: LEFT LATISSIMUS DORSI FLAP TO LEFT CHEST;  Surgeon: Irene Limbo, MD;  Location: [place];  Service: Plastics;  Laterality: Left;  . LEFT HEART CATH AND CORONARY ANGIOGRAPHY N/A 07/24/2018   Procedure: LEFT HEART CATH AND CORONARY ANGIOGRAPHY;  Surgeon: Troy Sine, MD;  Location: [place] CV LAB;  Service: Cardiovascular;  Laterality: N/A;  . LIPOMA EXCISION    . MASTECTOMY W/ SENTINEL NODE BIOPSY Bilateral 04/12/2019   Procedure: RIGHT BREAST RISK REDUCING MASTECTOMY; LEFT MASTECTOMY WITH LEFT AXILLARY SENTINEL LYMPH NODE BIOPSY WITH BLUE DYE INJECTION;  Surgeon: [name], MD;  Location: [place];  Service: General;  Laterality: Bilateral;  . MYOMECTOMY    . PORT-A-CATH REMOVAL N/A 02/03/2015   Procedure: REMOVAL PORT-A-CATH;  Surgeon: [name], MD;  Location: [place];  Service: General;  Laterality: N/A;  . PORTACATH PLACEMENT N/A 03/01/2014   Procedure: INSERTION PORT-A-CATH;  Surgeon: [name], MD;  Location: [place];  Service: General;  Laterality: N/A;  . PORTACATH PLACEMENT N/A 11/18/2018   Procedure: INSERTION PORT-A-CATH WITH ULTRASOUND;  Surgeon: [name], MD;  Location: [place];  Service: General;  Laterality: N/A;  . TONSILLECTOMY    . TUBAL LIGATION  There were no vitals filed for this visit.  Subjective Assessment - 05/31/19 1432    Subjective  "I'm feeling much better and the cording is improving. In fact I didn't feel it at all yesterday." The HEP's are also getting alot better too, more comfortable now than they were.    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  She plans to start [facility] in September.   Past history includes left breast cancer in 2015 with lumpectomy with one node removed. and radiation History of vertigo    Currently in Pain?  No/denies                       [facility] Adult PT Treatment/Exercise - 05/31/19 0001      Shoulder Exercises: Pulleys   Flexion  2 minutes    Flexion Limitations  Pt returned good demo and repotrs feeling good stretch with these. VCs to decrease scapula compensation    ABduction  2 minutes    ABduction Limitations  Pt returned demo and reports also feeling good stretch with this that became more comfortable by the time she was done.      Shoulder Exercises: Therapy Ball   Flexion  Both;10 reps   gentle forward lean into end of stretch     Shoulder Exercises: Stretch   Wall Stretch - ABduction  3 reps   5 sec holds using finger ladder, Lt #21, Rt #24     Manual Therapy   Manual Therapy  Soft tissue mobilization;Myofascial release;Scapular mobilization;Manual Lymphatic Drainage (MLD);Passive ROM    Soft tissue mobilization  To Lt upper trap in Rt S/L and along Lt later trunk, pts sensitivity much improved!     Myofascial Release  In supine during P/ROM to bil axillae, and gentle UE pulling during, pt able to tolerate this without any discomfort today and reported it feeling good    Scapular Mobilization  In Rt S/L to Lt scapula in all planes with mod pressure and pt was able to tolerate this without any discomfort    Manual Lymphatic Drainage (MLD)  in supine, short neck, diaphragmatic breaths, Lt inguinal nodes, Lt axillo-inguinal anastomosis, and stationary circles to left chest and and anterior shoulder and lateral trunk redirecting towards anastomosis, then also when pt in Rt S/L    Passive ROM  In Supine: to Lt>Rt shoulders into flexion, abduction, D2 and er to pts tolerance; multiple VCs to relax throughout and she tolerated stretches better with  elbow flexion at end ROM's               PT Short Term Goals - 05/18/19 1700      PT SHORT TERM GOAL #1   Title  Pt will have 150 degrees of right shoulder flexion and abduction so that she can perfom ADLs easier    Time  4    Period  Weeks    Status  New      PT SHORT TERM GOAL #2   Title  Pt will have 90 degrees of painfree  left shoulder flexion and abduction so that she can perform self care easier    Time  4    Period  Weeks  Status  New      PT SHORT TERM GOAL #3   Title  Pt will be indepenedent in a basic HEP for shoulder ROM    Time  4    Period  Weeks    Status  New        PT Long Term Goals - 05/18/19 1703      PT LONG TERM GOAL #1   Title  Pt will have 140  degrees of painfree  left shoulder flexion and abduction so that she can perform self care easier    Time  8    Period  Weeks    Status  New      PT LONG TERM GOAL #2   Title  Pt will report the "pulling" in her left arm is decreased by 75%    Time  8    Period  Weeks    Status  New      PT LONG TERM GOAL #3   Title  Pt will verbalize lymphedema risk reduction practices    Time  8    Period  Weeks    Status  New      PT LONG TERM GOAL #4   Title  Pt will be independent in a HEP for strengthening so that she can return to normal activities    Time  8    Period  Weeks    Status  New      PT LONG TERM GOAL #5   Title  Pt will decrease Quick DASH score to < 20 indicating an improvement in functional use of UE    Baseline  50    Time  8    Period  Weeks    Status  New            Plan - 05/31/19 1429    Clinical Impression Statement  Pt with very good improvement since this therapist saw pt last. Her tenderness to touch is much improved, as well are her cording symptoms in Lt UE and bil AA/P/ROM. Able to tolerate P/ROM without discomfort now as well. Progressed AA/ROM to include pulleys, ball up wall and finger ladder and she reports feeling good strtches with all. Pt is pleased  with her progress thus far and reports feeling much looser overall and able to stand up straight.    Personal Factors and Comorbidities  Past/Current Experience;Comorbidity 3+    Comorbidities  previous breast cancer with lumpectomy and radiation, one node removed in 2015 and one removed in 2020, current reconstruction process wit lat flap on the left    Examination-Activity Limitations  Bathing;Caring for Others    Stability/Clinical Decision Making  Evolving/Moderate complexity    Rehab Potential  Good    PT Frequency  2x / week    PT Duration  8 weeks    PT Treatment/Interventions  ADLs/Self Care Home Management;Moist Heat;DME Instruction;Functional mobility training;Therapeutic activities;Therapeutic exercise;Patient/family education;Orthotic Fit/Training;Manual techniques;Manual lymph drainage;Passive range of motion;Scar mobilization;Compression bandaging    PT Next Visit Plan  Cont AA/ROM exercises and manual therapy focsuing on end ROM stretching and soft tissue mobs as tolerated. Pt sees Dr. Iran Planas after next session so she can let her know if open area on Rt breast is healing okay. Try supine scapular series in next 1-2 sessions.    PT Home Exercise Plan  supine dowel    Consulted and Agree with Plan of Care  Patient       Patient will benefit from  skilled therapeutic intervention in order to improve the following deficits and impairments:  Decreased activity tolerance, Decreased knowledge of use of DME, Decreased safety awareness, Decreased strength, Increased fascial restricitons, Impaired flexibility, Impaired UE functional use, Postural dysfunction, Pain, Impaired perceived functional ability, Increased edema, Decreased scar mobility, Decreased range of motion, Decreased knowledge of precautions  Visit Diagnosis: Aftercare following surgery for neoplasm  Stiffness of left shoulder, not elsewhere classified  Stiffness of right shoulder, not elsewhere classified  Muscle  weakness (generalized)  Abnormal posture     Problem List Patient Active Problem List   Diagnosis Date Noted  . Port-A-Cath in place 11/26/2018  . Recurrent cancer of left breast ([place]) 11/13/2018  . Neuropathy due to chemotherapeutic drug ([place]) 11/13/2018  . Chest tightness   . Vertigo 05/28/2018  . Prediabetes 05/28/2018  . OSA (obstructive sleep apnea) 08/20/2016  . Snoring 08/20/2016  . Adjustment disorder with mixed anxiety and depressed mood 08/20/2016  . Genetic testing 07/07/2015  . Family history of uterine cancer   . Family history of bladder cancer   . Abnormal AST and ALT 07/08/2014  . Fatty infiltration of liver 07/08/2014  . RUQ pain 06/27/2014  . Nonspecific (abnormal) findings on radiological and other examination of gastrointestinal tract 06/27/2014  . Fever 06/15/2014  . Diarrhea 06/10/2014  . Chemotherapy-induced nausea 06/10/2014  . Abdominal pain 06/10/2014  . Drug induced neutropenia(288.03) 04/21/2014  . Malignant neoplasm of upper-inner quadrant of left breast in female, estrogen receptor negative ([place]) 02/11/2014  . OTHER CHEST PAIN 05/21/2010  . HYPERLIPIDEMIA 05/11/2010  . PSORIASIS 05/11/2010  . PALPITATIONS 05/11/2010  . Nonspecific (abnormal) findings on radiological and other examination of body structure 05/11/2010  . CHEST XRAY, ABNORMAL 05/11/2010  . HYPOTHYROIDISM 05/10/2010  . EPIGASTRIC PAIN 05/10/2010  . CONJUNCTIVITIS, ALLERGIC 02/18/2008  . MALAISE AND FATIGUE 01/11/2008  . Essential hypertension 09/02/2007  . SINUSITIS- ACUTE-NOS 09/02/2007  . Acute upper respiratory infection 09/02/2007  . Allergic rhinitis, cause unspecified 09/02/2007    Otelia Limes, PTA 05/31/2019, 3:36 PM  [place], [place], [address] Phone: [phone]   Fax:  [phone]  Name: AILIE GAGE MRN: [id number] Date of Birth: [birth year] PHYSICAL THERAPY  DISCHARGE SUMMARY  Visits from Start of Care: 4  Current functional level related to goals / functional outcomes: unknown   Remaining deficits: unknown   Education / Equipment: Home exercise  Plan: Patient agrees to discharge.  Patient goals were not met. Patient is being discharged due to not returning since the last visit.  ?????    Donato Heinz. Owens Shark, PT

## 2019-06-02 ENCOUNTER — Other Ambulatory Visit (HOSPITAL_COMMUNITY)
Admission: RE | Admit: 2019-06-02 | Discharge: 2019-06-02 | Disposition: A | Discharge status: Home / Self Care | Payer: No Typology Code available for payment source | Source: Ambulatory Visit | Attending: Plastic Surgery | Admitting: Plastic Surgery

## 2019-06-02 ENCOUNTER — Encounter (HOSPITAL_BASED_OUTPATIENT_CLINIC_OR_DEPARTMENT_OTHER): Payer: Self-pay

## 2019-06-02 ENCOUNTER — Ambulatory Visit (HOSPITAL_BASED_OUTPATIENT_CLINIC_OR_DEPARTMENT_OTHER): Payer: No Typology Code available for payment source | Admitting: Certified Registered"

## 2019-06-02 ENCOUNTER — Encounter (HOSPITAL_BASED_OUTPATIENT_CLINIC_OR_DEPARTMENT_OTHER)
Admission: RE | Disposition: A | Discharge status: Home / Self Care | Payer: Self-pay | Source: Ambulatory Visit | Attending: Plastic Surgery

## 2019-06-02 ENCOUNTER — Other Ambulatory Visit: Payer: Self-pay

## 2019-06-02 ENCOUNTER — Ambulatory Visit (HOSPITAL_BASED_OUTPATIENT_CLINIC_OR_DEPARTMENT_OTHER)
Admission: RE | Admit: 2019-06-02 | Discharge: 2019-06-02 | Disposition: A | Discharge status: Home / Self Care | Payer: No Typology Code available for payment source | Source: Ambulatory Visit | Attending: Plastic Surgery | Admitting: Plastic Surgery

## 2019-06-02 DIAGNOSIS — C50912 Malignant neoplasm of unspecified site of left female breast: Secondary | ICD-10-CM | POA: Diagnosis not present

## 2019-06-02 DIAGNOSIS — Z923 Personal history of irradiation: Secondary | ICD-10-CM | POA: Insufficient documentation

## 2019-06-02 DIAGNOSIS — K219 Gastro-esophageal reflux disease without esophagitis: Secondary | ICD-10-CM | POA: Diagnosis not present

## 2019-06-02 DIAGNOSIS — Z20828 Contact with and (suspected) exposure to other viral communicable diseases: Secondary | ICD-10-CM | POA: Insufficient documentation

## 2019-06-02 DIAGNOSIS — Z9221 Personal history of antineoplastic chemotherapy: Secondary | ICD-10-CM | POA: Insufficient documentation

## 2019-06-02 DIAGNOSIS — T86828 Other complications of skin graft (allograft) (autograft): Secondary | ICD-10-CM | POA: Insufficient documentation

## 2019-06-02 DIAGNOSIS — G4730 Sleep apnea, unspecified: Secondary | ICD-10-CM | POA: Diagnosis not present

## 2019-06-02 DIAGNOSIS — Z9013 Acquired absence of bilateral breasts and nipples: Secondary | ICD-10-CM | POA: Insufficient documentation

## 2019-06-02 DIAGNOSIS — X58XXXA Exposure to other specified factors, initial encounter: Secondary | ICD-10-CM | POA: Insufficient documentation

## 2019-06-02 DIAGNOSIS — I10 Essential (primary) hypertension: Secondary | ICD-10-CM | POA: Insufficient documentation

## 2019-06-02 DIAGNOSIS — Z87891 Personal history of nicotine dependence: Secondary | ICD-10-CM | POA: Insufficient documentation

## 2019-06-02 DIAGNOSIS — L03313 Cellulitis of chest wall: Secondary | ICD-10-CM | POA: Insufficient documentation

## 2019-06-02 DIAGNOSIS — S21002A Unspecified open wound of left breast, initial encounter: Secondary | ICD-10-CM | POA: Diagnosis not present

## 2019-06-02 DIAGNOSIS — Z853 Personal history of malignant neoplasm of breast: Secondary | ICD-10-CM | POA: Insufficient documentation

## 2019-06-02 HISTORY — PX: WOUND DEBRIDEMENT: SHX247

## 2019-06-02 HISTORY — PX: REMOVAL OF TISSUE EXPANDER AND PLACEMENT OF IMPLANT: SHX6457

## 2019-06-02 LAB — SARS CORONAVIRUS 2 BY RT PCR (HOSPITAL ORDER, PERFORMED IN ~~LOC~~ HOSPITAL LAB): SARS Coronavirus 2: NEGATIVE

## 2019-06-02 SURGERY — REMOVAL, TISSUE EXPANDER, BREAST, WITH IMPLANT INSERTION
Anesthesia: General | Site: Chest | Laterality: Right

## 2019-06-02 MED ORDER — FENTANYL CITRATE (PF) 100 MCG/2ML IJ SOLN
INTRAMUSCULAR | Status: DC | PRN
  Administered 2019-06-02: 50 ug via INTRAVENOUS
  Administered 2019-06-02: 100 ug via INTRAVENOUS

## 2019-06-02 MED ORDER — PROPOFOL 10 MG/ML IV BOLUS
INTRAVENOUS | Status: AC
  Filled 2019-06-02: qty 20

## 2019-06-02 MED ORDER — PROPOFOL 10 MG/ML IV BOLUS
INTRAVENOUS | Status: DC | PRN
  Administered 2019-06-02: 150 mg via INTRAVENOUS

## 2019-06-02 MED ORDER — FENTANYL CITRATE (PF) 100 MCG/2ML IJ SOLN
INTRAMUSCULAR | Status: AC
  Filled 2019-06-02: qty 2

## 2019-06-02 MED ORDER — MEPERIDINE HCL 25 MG/ML IJ SOLN: 6.2500 mg | INTRAMUSCULAR | Status: DC | PRN

## 2019-06-02 MED ORDER — VANCOMYCIN HCL IN DEXTROSE 1-5 GM/200ML-% IV SOLN
1000.0000 mg | INTRAVENOUS | Status: AC
  Administered 2019-06-02: 1000 mg via INTRAVENOUS

## 2019-06-02 MED ORDER — FENTANYL CITRATE (PF) 100 MCG/2ML IJ SOLN
25.0000 ug | INTRAMUSCULAR | Status: DC | PRN
  Administered 2019-06-02: 50 ug via INTRAVENOUS

## 2019-06-02 MED ORDER — ONDANSETRON HCL 4 MG/2ML IJ SOLN
INTRAMUSCULAR | Status: AC
  Filled 2019-06-02: qty 8

## 2019-06-02 MED ORDER — BUPIVACAINE-EPINEPHRINE 0.25% -1:200000 IJ SOLN
INTRAMUSCULAR | Status: DC | PRN
  Administered 2019-06-02: 20 mL

## 2019-06-02 MED ORDER — LIDOCAINE HCL (CARDIAC) PF 100 MG/5ML IV SOSY
PREFILLED_SYRINGE | INTRAVENOUS | Status: DC | PRN
  Administered 2019-06-02: 60 mg via INTRAVENOUS

## 2019-06-02 MED ORDER — LACTATED RINGERS IV SOLN
INTRAVENOUS | Status: DC
  Administered 2019-06-02: 14:00:00 via INTRAVENOUS

## 2019-06-02 MED ORDER — MIDAZOLAM HCL 2 MG/2ML IJ SOLN
INTRAMUSCULAR | Status: AC
  Filled 2019-06-02: qty 2

## 2019-06-02 MED ORDER — ONDANSETRON HCL 4 MG/2ML IJ SOLN
INTRAMUSCULAR | Status: DC | PRN
  Administered 2019-06-02: 4 mg via INTRAVENOUS

## 2019-06-02 MED ORDER — VANCOMYCIN HCL IN DEXTROSE 1-5 GM/200ML-% IV SOLN
INTRAVENOUS | Status: AC
  Filled 2019-06-02: qty 200

## 2019-06-02 MED ORDER — SCOPOLAMINE 1 MG/3DAYS TD PT72
1.0000 | MEDICATED_PATCH | TRANSDERMAL | Status: DC
  Administered 2019-06-02: 14:00:00 1.5 mg via TRANSDERMAL

## 2019-06-02 MED ORDER — SCOPOLAMINE 1 MG/3DAYS TD PT72
MEDICATED_PATCH | TRANSDERMAL | Status: AC
  Filled 2019-06-02: qty 1

## 2019-06-02 MED ORDER — SODIUM CHLORIDE 0.9 % IV SOLN
INTRAVENOUS | Status: DC | PRN
  Administered 2019-06-02: 15:00:00 1000 mL

## 2019-06-02 MED ORDER — ROCURONIUM BROMIDE 10 MG/ML (PF) SYRINGE
PREFILLED_SYRINGE | INTRAVENOUS | Status: AC
  Filled 2019-06-02: qty 10

## 2019-06-02 MED ORDER — LACTATED RINGERS IV SOLN: INTRAVENOUS | Status: DC

## 2019-06-02 MED ORDER — DEXAMETHASONE SODIUM PHOSPHATE 10 MG/ML IJ SOLN
INTRAMUSCULAR | Status: AC
  Filled 2019-06-02: qty 2

## 2019-06-02 MED ORDER — LIDOCAINE 2% (20 MG/ML) 5 ML SYRINGE
INTRAMUSCULAR | Status: AC
  Filled 2019-06-02: qty 15

## 2019-06-02 MED ORDER — PHENYLEPHRINE 40 MCG/ML (10ML) SYRINGE FOR IV PUSH (FOR BLOOD PRESSURE SUPPORT)
PREFILLED_SYRINGE | INTRAVENOUS | Status: AC
  Filled 2019-06-02: qty 10

## 2019-06-02 MED ORDER — METOCLOPRAMIDE HCL 5 MG/ML IJ SOLN: 10.0000 mg | Freq: Once | INTRAMUSCULAR | Status: DC | PRN

## 2019-06-02 MED ORDER — MIDAZOLAM HCL 5 MG/5ML IJ SOLN
INTRAMUSCULAR | Status: DC | PRN
  Administered 2019-06-02: 2 mg via INTRAVENOUS

## 2019-06-02 SURGICAL SUPPLY — 74 items
ADH SKN CLS APL DERMABOND .7 (GAUZE/BANDAGES/DRESSINGS) ×2
APL PRP STRL LF DISP 70% ISPRP (MISCELLANEOUS)
BAG DECANTER FOR FLEXI CONT (MISCELLANEOUS) ×4 IMPLANT
BINDER BREAST LRG (GAUZE/BANDAGES/DRESSINGS) IMPLANT
BINDER BREAST MEDIUM (GAUZE/BANDAGES/DRESSINGS) IMPLANT
BINDER BREAST XLRG (GAUZE/BANDAGES/DRESSINGS) ×2 IMPLANT
BINDER BREAST XXLRG (GAUZE/BANDAGES/DRESSINGS) IMPLANT
BLADE SURG 10 STRL SS (BLADE) ×4 IMPLANT
BLADE SURG 15 STRL LF DISP TIS (BLADE) IMPLANT
BLADE SURG 15 STRL SS (BLADE) ×4
BNDG GAUZE ELAST 4 BULKY (GAUZE/BANDAGES/DRESSINGS) ×4 IMPLANT
CANISTER SUCT 1200ML W/VALVE (MISCELLANEOUS) ×6 IMPLANT
CHLORAPREP W/TINT 26 (MISCELLANEOUS) ×2 IMPLANT
COVER BACK TABLE REUSABLE LG (DRAPES) ×4 IMPLANT
COVER MAYO STAND REUSABLE (DRAPES) ×4 IMPLANT
COVER WAND RF STERILE (DRAPES) IMPLANT
DECANTER SPIKE VIAL GLASS SM (MISCELLANEOUS) IMPLANT
DERMABOND ADVANCED (GAUZE/BANDAGES/DRESSINGS) ×2
DERMABOND ADVANCED .7 DNX12 (GAUZE/BANDAGES/DRESSINGS) ×4 IMPLANT
DRAIN CHANNEL 15F RND FF W/TCR (WOUND CARE) IMPLANT
DRAIN CHANNEL 19F RND (DRAIN) ×2 IMPLANT
DRAPE HALF SHEET 70X43 (DRAPES) ×4 IMPLANT
DRAPE LAPAROSCOPIC ABDOMINAL (DRAPES) ×2 IMPLANT
DRAPE SPLIT 6X30 W/TAPE (DRAPES) ×2 IMPLANT
DRAPE TOP ARMCOVERS (MISCELLANEOUS) ×2 IMPLANT
DRAPE UTILITY XL STRL (DRAPES) ×4 IMPLANT
DRSG PAD ABDOMINAL 8X10 ST (GAUZE/BANDAGES/DRESSINGS) ×8 IMPLANT
ELECT BLADE 4.0 EZ CLEAN MEGAD (MISCELLANEOUS) ×4
ELECT COATED BLADE 2.86 ST (ELECTRODE) ×4 IMPLANT
ELECT REM PT RETURN 9FT ADLT (ELECTROSURGICAL) ×4
ELECTRODE BLDE 4.0 EZ CLN MEGD (MISCELLANEOUS) ×2 IMPLANT
ELECTRODE REM PT RTRN 9FT ADLT (ELECTROSURGICAL) ×2 IMPLANT
EVACUATOR SILICONE 100CC (DRAIN) ×2 IMPLANT
GLOVE BIO SURGEON STRL SZ 6 (GLOVE) ×8 IMPLANT
GLOVE BIOGEL PI IND STRL 7.0 (GLOVE) IMPLANT
GLOVE BIOGEL PI INDICATOR 7.0 (GLOVE) ×2
GLOVE ECLIPSE 6.5 STRL STRAW (GLOVE) ×2 IMPLANT
GLOVE EXAM NITRILE MD LF STRL (GLOVE) ×2 IMPLANT
GOWN STRL REUS W/ TWL LRG LVL3 (GOWN DISPOSABLE) ×4 IMPLANT
GOWN STRL REUS W/TWL LRG LVL3 (GOWN DISPOSABLE) ×8
IV NS 500ML (IV SOLUTION)
IV NS 500ML BAXH (IV SOLUTION) ×2 IMPLANT
KIT FILL SYSTEM UNIVERSAL (SET/KITS/TRAYS/PACK) IMPLANT
MARKER SKIN DUAL TIP RULER LAB (MISCELLANEOUS) IMPLANT
NDL FILTER BLUNT 18X1 1/2 (NEEDLE) IMPLANT
NDL HYPO 25X1 1.5 SAFETY (NEEDLE) IMPLANT
NEEDLE FILTER BLUNT 18X 1/2SAF (NEEDLE)
NEEDLE FILTER BLUNT 18X1 1/2 (NEEDLE) IMPLANT
NEEDLE HYPO 25X1 1.5 SAFETY (NEEDLE) ×4 IMPLANT
PACK BASIN DAY SURGERY FS (CUSTOM PROCEDURE TRAY) ×4 IMPLANT
PENCIL BUTTON HOLSTER BLD 10FT (ELECTRODE) ×4 IMPLANT
PIN SAFETY STERILE (MISCELLANEOUS) ×2 IMPLANT
SLEEVE SCD COMPRESS KNEE MED (MISCELLANEOUS) ×4 IMPLANT
SPONGE LAP 18X18 RF (DISPOSABLE) ×6 IMPLANT
STAPLER VISISTAT 35W (STAPLE) ×2 IMPLANT
SUT ETHILON 2 0 FS 18 (SUTURE) ×2 IMPLANT
SUT MNCRL AB 4-0 PS2 18 (SUTURE) ×4 IMPLANT
SUT MON AB 3-0 SH 27 (SUTURE) ×4
SUT MON AB 3-0 SH27 (SUTURE) IMPLANT
SUT PDS AB 2-0 CT2 27 (SUTURE) IMPLANT
SUT SILK 2 0 SH (SUTURE) IMPLANT
SUT VIC AB 3-0 PS1 18 (SUTURE)
SUT VIC AB 3-0 PS1 18XBRD (SUTURE) IMPLANT
SUT VIC AB 3-0 SH 27 (SUTURE)
SUT VIC AB 3-0 SH 27X BRD (SUTURE) ×2 IMPLANT
SUT VICRYL 4-0 PS2 18IN ABS (SUTURE) ×2 IMPLANT
SYR 20ML LL LF (SYRINGE) IMPLANT
SYR BULB IRRIGATION 50ML (SYRINGE) ×6 IMPLANT
SYR CONTROL 10ML LL (SYRINGE) ×2 IMPLANT
TOWEL GREEN STERILE FF (TOWEL DISPOSABLE) ×8 IMPLANT
TUBE CONNECTING 20'X1/4 (TUBING) ×2
TUBE CONNECTING 20X1/4 (TUBING) ×6 IMPLANT
UNDERPAD 30X36 HEAVY ABSORB (UNDERPADS AND DIAPERS) ×8 IMPLANT
YANKAUER SUCT BULB TIP NO VENT (SUCTIONS) ×4 IMPLANT

## 2019-06-02 NOTE — Op Note (Addendum)
Operative Note   DATE OF OPERATION: 9.2.20  LOCATION: [place] DIVISION: Plastic Surgery  PREOPERATIVE DIAGNOSES:  1. Left breast cancer recurrent 2. Acquired absence breasts 3. Open wound right chest 4. Skin flap necrosis  POSTOPERATIVE DIAGNOSES:  same  PROCEDURE:  1. Removal right chest tissue expander 2. Intermediate repair left chest 1.2 cm  SURGEON: Irene Limbo MD MBA  ASSISTANT: none  ANESTHESIA:  General.   EBL: 123XX123 ml  COMPLICATIONS: None immediate.   INDICATIONS FOR PROCEDURE:  The patient, Alexandria Bradley, is a 58 y.o. female born on 10/09/1960, is here for removal right chest tissue expander and acellular dermis. Patient is 7 weeks post operative bilateral mastectomies with immediate reconstruction. She developed fever over last 24 hours with now open wound right chest.    FINDINGS: Following removal eschar present right chest, noted to have full thickness wound with exposure acellular dermis. No fluid within cavity. With exception exposed acellular dermis, remainder in process of incorporation. Removed entirety ADM.  DESCRIPTION OF PROCEDURE:  The patient's operative site was marked with the patient in the preoperative area. The patient was taken to the operating room. SCDs were placed and IV antibiotics were given. The patient's operative site was prepped and draped in a sterile fashion. A time out was performed and all information was confirmed to be correct. Tangential excision left chest eschar completed. Wound noted to be superficial in nature, no exposure latissimus muscle or expander. Primary closure completed with 3-0 monocryl in dermis and 4-0 monocryl skin closure. Over right chest sharp excision of vertical scar and eschar completed. Expander and entirety ADM removed. Cavity irrigated with saline solution containing Ancef, gentamicin, and bacitracin. Local anesthetic infiltrated. 4 Fr JP placed in cavity and secured with 2-0  nylon. Closure completed with 3-0 monocryl in dermis and 4-0 monocryl simple running skin closure. Dermabond applied to incisions. Dry dressing and breast binder applied.   The patient was allowed to wake from anesthesia, extubated and taken to the recovery room in satisfactory condition.   SPECIMENS: none  DRAINS: 19 Fr JP in right subcutaneous breast  Irene Limbo, MD [facility] Plastic & Reconstructive Surgery [phone], pin [phone]

## 2019-06-02 NOTE — H&P (Signed)
  Subjective:     Patient ID: Alexandria Bradley is a 58 y.o. female.  HPI  7 weeks post op bilateral mastectomies with immediate reconstruction. Called last evening with temp 102.1, Bactrim prescribed. Reports drainage from right chest.   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 Underwent lumpectomy, SLN followed by adjuvant radiation and chemotherapy.  Represented 10/2018 with palpable left breast mass. MMG/US sowed mass near lumpectomy scar at 10 o'clock, 7 cmfn, 14 x 12 x 13 mm. No axillary adenopathy. Biopsy with triple negative IDC.  MRI demonstrated known mass left breast UIQ 1.7 cm, LN normal.  Staging scans negative.  Completed neoadjuvant chemotherapy. Final MRI with mass measuring 1.5 cm. Will receive pembrolizumab adjuvantly.  Final pathology left breast 0.5 cm IDC, 0/1 SLN.  Genetics 2016 negative.  Prior to lumpectomy B cup, prior to mastectomies wearing same but noted depression in area lumpectomy and does not fill out cup equally. Right mastectomy 622 g Left 655 g  Worked as reception for a Pharmacist, community prior to [employer], patient does not plan to return to work. Lives with husband who is a Insurance underwriter. Son and daughter in area, latter is RN with [employer].     Objective:   Physical Exam  Cardiovascular: Normal rate, regular rhythm and normal heart sounds.  Pulmonary/Chest: Effort normal and breath sounds normal.  Gen: tired appearing Chest:  Right chest lifting scab at superior extent vertical scar with faint cellulitis medial chest  left incision with <1cm area with dry eschar- unchanged  Back: incision intact flat no seroma glue in place  Assessment:     History triple negative left breast cancer s/p left lumpectomy, SLN History therapeutic radiation Recurrent left breast cancer Neoadjuvant chemotherapy S/p bilateral mastectomies (right SRM, Left SSM), right prepectoral TE/ADM ([facility]), left  TE/LD reconstruction    Plan:   New fever and cellulitis right chest. There is open area on this side concerning for contamination/communication with ADM and TE. Offered antibiotic trial vs removal. Given progression overnight redness I do recommend removal right TE and acellular dermis. Patient agrees. Will plan excision closure left chest eschar as well.    RIGHT Natrelle 133S-FV-12-T 400 ml tissue expander placed,  fill volume 420 ml saline  LEFT Natrelle 133S-MX-12-T 400 ml tissue expander placed,  initial fill volume 285 ml saline + ml  = ml total fill volume saline  Irene Limbo, MD [facility] Plastic & Reconstructive Surgery [phone], pin [phone]

## 2019-06-02 NOTE — Anesthesia Procedure Notes (Signed)
Procedure Name: LMA Insertion Date/Time: 06/02/2019 2:34 PM Performed by: Arabel Barcenas, Ernesta Amble, CRNA Pre-anesthesia Checklist: Patient identified, Emergency Drugs available, Suction available and Patient being monitored Patient Re-evaluated:Patient Re-evaluated prior to induction Oxygen Delivery Method: Circle system utilized Preoxygenation: Pre-oxygenation with 100% oxygen Induction Type: IV induction Ventilation: Mask ventilation without difficulty LMA: LMA inserted LMA Size: 4.0 Number of attempts: 1 Airway Equipment and Method: Bite block Placement Confirmation: positive ETCO2 Tube secured with: Tape Dental Injury: Teeth and Oropharynx as per pre-operative assessment

## 2019-06-02 NOTE — Anesthesia Preprocedure Evaluation (Addendum)
Anesthesia Evaluation  Patient identified by MRN, date of birth, ID band Patient awake    Reviewed: Allergy & Precautions, NPO status , Patient's Chart, lab work & pertinent test results  History of Anesthesia Complications (+) PONVNegative for: history of anesthetic complications  Airway Mallampati: II  TM Distance: >3 FB Neck ROM: Full    Dental  (+) Teeth Intact, Dental Advisory Given   Pulmonary sleep apnea , former smoker,    Pulmonary exam normal        Cardiovascular hypertension, Pt. on medications and Pt. on home beta blockers (-) CAD Normal cardiovascular exam     Neuro/Psych negative neurological ROS  negative psych ROS   GI/Hepatic Neg liver ROS, GERD  Medicated,  Endo/Other  Hypothyroidism   Renal/GU negative Renal ROS  negative genitourinary   Musculoskeletal negative musculoskeletal ROS (+)   Abdominal   Peds  Hematology negative hematology ROS (+)   Anesthesia Other Findings Recurrent breast cancer  Echo 07/28/18: EF 55-60%, grade 1 dd, mild to mod RV dysfunction Cath 07/24/18: normal coronary arteries  Reproductive/Obstetrics                             Anesthesia Physical  Anesthesia Plan  ASA: II  Anesthesia Plan: General   Post-op Pain Management:    Induction: Intravenous  PONV Risk Score and Plan: 4 or greater and Ondansetron, Midazolam, Treatment may vary due to age or medical condition and Scopolamine patch - Pre-op  Airway Management Planned: LMA  Additional Equipment: None  Intra-op Plan:   Post-operative Plan: Extubation in OR  Informed Consent: I have reviewed the patients History and Physical, chart, labs and discussed the procedure including the risks, benefits and alternatives for the proposed anesthesia with the patient or authorized representative who has indicated his/her understanding and acceptance.     Dental advisory given  Plan  Discussed with:   Anesthesia Plan Comments: (No decadron.  Give Zofran 8mg  and Reglan 10mg  and scopolamine patch +/- phenergan )       Anesthesia Quick Evaluation

## 2019-06-02 NOTE — Discharge Instructions (Signed)
Post Anesthesia Home Care Instructions  Activity: Get plenty of rest for the remainder of the day. A responsible individual must stay with you for 24 hours following the procedure.  For the next 24 hours, DO NOT: -Drive a car -Paediatric nurse -Drink alcoholic beverages -Take any medication unless instructed by your physician -Make any legal decisions or sign important papers.  Meals: Start with liquid foods such as gelatin or soup. Progress to regular foods as tolerated. Avoid greasy, spicy, heavy foods. If nausea and/or vomiting occur, drink only clear liquids until the nausea and/or vomiting subsides. Call your physician if vomiting continues.  Special Instructions/Symptoms: Your throat may feel dry or sore from the anesthesia or the breathing tube placed in your throat during surgery. If this causes discomfort, gargle with warm salt water. The discomfort should disappear within 24 hours.  If you had a scopolamine patch placed behind your ear for the management of post- operative nausea and/or vomiting:  1. The medication in the patch is effective for 72 hours, after which it should be removed.  Wrap patch in a tissue and discard in the trash. Wash hands thoroughly with soap and water. 2. You may remove the patch earlier than 72 hours if you experience unpleasant side effects which may include dry mouth, dizziness or visual disturbances. 3. Avoid touching the patch. Wash your hands with soap and water after contact with the patch.        JP Drain [employer] this sheet to all of your post-operative appointments while you have your drains.  Please measure your drains by CC's or ML's.  Make sure you drain and measure your JP Drains 2 or 3 times per day.  At the end of each day, add up totals for the left side and add up totals for the right side.    ( 9 am )     ( 3 pm )        ( 9 pm )                Date L  R  L  R  L  R  Total L/R                                                                                                                                                                                   Post Anesthesia Home Care Instructions  Activity: Get plenty of rest for the remainder of the day. A responsible individual must stay with you for 24 hours following the procedure.  For the next 24 hours, DO NOT: -Drive a car -Paediatric nurse -Drink alcoholic beverages -Take any medication unless instructed by  your physician -Make any legal decisions or sign important papers.  Meals: Start with liquid foods such as gelatin or soup. Progress to regular foods as tolerated. Avoid greasy, spicy, heavy foods. If nausea and/or vomiting occur, drink only clear liquids until the nausea and/or vomiting subsides. Call your physician if vomiting continues.  Special Instructions/Symptoms: Your throat may feel dry or sore from the anesthesia or the breathing tube placed in your throat during surgery. If this causes discomfort, gargle with warm salt water. The discomfort should disappear within 24 hours.  If you had a scopolamine patch placed behind your ear for the management of post- operative nausea and/or vomiting:  1. The medication in the patch is effective for 72 hours, after which it should be removed.  Wrap patch in a tissue and discard in the trash. Wash hands thoroughly with soap and water. 2. You may remove the patch earlier than 72 hours if you experience unpleasant side effects which may include dry mouth, dizziness or visual disturbances. 3. Avoid touching the patch. Wash your hands with soap and water after contact with the patch.

## 2019-06-02 NOTE — Transfer of Care (Signed)
Immediate Anesthesia Transfer of Care Note  Patient: DENIESE AUBRY  Procedure(s) Performed: REMOVAL OF TISSUE EXPANDER RIGHT CHEST (Right Chest)  Patient Location: PACU  Anesthesia Type:General  Level of Consciousness: awake, alert , oriented and patient cooperative  Airway & Oxygen Therapy: Patient Spontanous Breathing and Patient connected to face mask oxygen  Post-op Assessment: Report given to RN and Post -op Vital signs reviewed and stable  Post vital signs: Reviewed and stable  Last Vitals:  Vitals Value Taken Time  BP    Temp    Pulse 102 06/02/19 1533  Resp    SpO2 100 % 06/02/19 1533  Vitals shown include unvalidated device data.  Last Pain:  Vitals:   06/02/19 1342  TempSrc: Oral  PainSc: 0-No pain         Complications: No apparent anesthesia complications

## 2019-06-04 NOTE — Anesthesia Postprocedure Evaluation (Signed)
Anesthesia Post Note  Patient: VAL TIBERI  Procedure(s) Performed: REMOVAL OF TISSUE EXPANDER RIGHT CHEST (Right Chest) LEFT CHEST DEBRIDEMENT (Left Chest)     Patient location during evaluation: PACU Anesthesia Type: General Level of consciousness: awake and alert Pain management: pain level controlled Vital Signs Assessment: post-procedure vital signs reviewed and stable Respiratory status: spontaneous breathing, nonlabored ventilation, respiratory function stable and patient connected to nasal cannula oxygen Cardiovascular status: blood pressure returned to baseline and stable Postop Assessment: no apparent nausea or vomiting Anesthetic complications: no    Last Vitals:  Vitals:   06/02/19 1645 06/02/19 1656  BP:  99/87  Pulse:  92  Resp:  18  Temp:  37.4 C  SpO2: 96% 93%    Last Pain:  Vitals:   06/03/19 1104  TempSrc:   PainSc: 2                  Chelsey L Woodrum

## 2019-06-08 ENCOUNTER — Encounter: Payer: 59 | Admitting: Physical Therapy

## 2019-06-08 ENCOUNTER — Telehealth: Payer: Self-pay | Admitting: *Deleted

## 2019-06-08 ENCOUNTER — Encounter (HOSPITAL_BASED_OUTPATIENT_CLINIC_OR_DEPARTMENT_OTHER): Payer: Self-pay | Admitting: Plastic Surgery

## 2019-06-08 NOTE — Progress Notes (Signed)
[place]  [phone] [phone]) [phone]    ID: [name] OB: 08/24/1961  MR#: [id number]  AXK#:[id number]  Patient Care Team: [name], DO as PCP - General (Family Medicine) Josue Hector, MD as PCP - Cardiology (Cardiology) Magrinat, Virgie Dad, MD as Consulting Physician (Oncology) Brien Few, MD as Consulting Physician (Obstetrics and Gynecology) [name], MD as Consulting Physician (General Surgery) Delrae Rend, MD as Consulting Physician (Endocrinology) Wilford Corner, MD as Consulting Physician (Gastroenterology) Dorna Leitz, MD as Consulting Physician (Orthopedic Surgery) Josue Hector, MD as Consulting Physician (Cardiology) Irene Limbo, MD as Consulting Physician (Plastic Surgery) OTHER MD:    CHIEF COMPLAINT: Triple negative breast cancer, locally recurrent (s/p bilateral mastectomies)  CURRENT TREATMENT: adjuvant capecitabine/pembrolizumab   INTERVAL HISTORY: Iness returns today for follow-up and treatment of her locally recurrent triple negative breast cancer. She was last seen here on 05/11/2019.   She is scheduled to start pembrolizumab today.  She has been thinking about this and is very motivated to proceed.  She also would like to proceed to capecitabine and this is discussed further below  About a week ago she developed a temperature of 102 degrees.  She had some ulceration on the right reconstructed breast area.  She brought this to Dr. Para Skeans attention and had the expander removed from the right 6 days ago.  I do not see pathology.  She was treated initially with an antibiotic (she does not recall the name) and then was switched to Augmentin when she had another episode of fever.  She still has a drain in place and is very uncomfortable but she is beginning to feel better she says.   REVIEW OF SYSTEMS: Georgian tells me before the fever problem from the implant she was  feeling better, enjoying rehab, doing some cooking.  She is not exercising regularly but tells me she has been told she needs to take it easy because of the reconstruction in progress.  She denies any unusual headaches visual changes cough phlegm production or pleurisy.  A detailed review of systems was otherwise stable.   BREAST CANCER HISTORY: From the original intake note of 02/16/2014:  [name] palpated a mass in her left breast early May and brought it immediately to her gynecologist attention. He set her up for bilateral diagnostic mammography and left ultrasonography at [facility] 02/07/2014. Mammography showed a 1.3 cm oval mass with spiculated margins in the left breast at the 11:00 position. This was palpable. Ultrasound showed a 1.1 cm tall her than wide mass with a microlobulated margins. He was hypoechoic. There were no abnormalities noted in the left axilla.  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 Lyndon Code at conference 02/16/2014), triple negative, with an MIB-1 of 50%.  The patient's subsequent history is as detailed below   PAST MEDICAL HISTORY: Past Medical History:  Diagnosis Date  . Anxiety   . Arthritis   . Back pain   . Breast cancer ([place])    left  . Fatty liver   . GERD (gastroesophageal reflux disease)   . Headache(784.0)    PMH : migraines  . History of kidney stones   . Hypercholesterolemia   . Hypertension   . Hypothyroidism   . Kidney stones   . Osteoporosis   . Personal history of chemotherapy   . Personal history of radiation therapy   . PONV (  postoperative nausea and vomiting)    difficult to wake up  . Pre-diabetes   . Psoriasis   . PVC's (premature ventricular contractions)   . Radiation 08/18/14-10/07/14   Left breast    PAST SURGICAL HISTORY: Past Surgical History:  Procedure Laterality Date  . BREAST BIOPSY Left 11/10/2018   . BREAST LUMPECTOMY Left 2015  . BREAST LUMPECTOMY WITH AXILLARY LYMPH NODE BIOPSY  6/15   left  . BREAST RECONSTRUCTION WITH PLACEMENT OF TISSUE EXPANDER AND ALLODERM Bilateral 04/12/2019   Procedure: BILATERAL BREAST RECONSTRUCTION WITH PLACEMENT OF TISSUE EXPANDERS; ALLODERM TO RIGHT CHEST;  Surgeon: Irene Limbo, MD;  Location: [place];  Service: Plastics;  Laterality: Bilateral;  . CARDIAC CATHETERIZATION    . CHOLECYSTECTOMY    . COLONOSCOPY N/A 06/27/2014   Procedure: COLONOSCOPY;  Surgeon: Lear Ng, MD;  Location: [facility] ENDOSCOPY;  Service: Endoscopy;  Laterality: N/A;  . LATISSIMUS FLAP TO BREAST Left 04/12/2019   Procedure: LEFT LATISSIMUS DORSI FLAP TO LEFT CHEST;  Surgeon: Irene Limbo, MD;  Location: [place];  Service: Plastics;  Laterality: Left;  . LEFT HEART CATH AND CORONARY ANGIOGRAPHY N/A 07/24/2018   Procedure: LEFT HEART CATH AND CORONARY ANGIOGRAPHY;  Surgeon: Troy Sine, MD;  Location: [place] CV LAB;  Service: Cardiovascular;  Laterality: N/A;  . LIPOMA EXCISION    . MASTECTOMY W/ SENTINEL NODE BIOPSY Bilateral 04/12/2019   Procedure: RIGHT BREAST RISK REDUCING MASTECTOMY; LEFT MASTECTOMY WITH LEFT AXILLARY SENTINEL LYMPH NODE BIOPSY WITH BLUE DYE INJECTION;  Surgeon: [name], MD;  Location: [place];  Service: General;  Laterality: Bilateral;  . MYOMECTOMY    . PORT-A-CATH REMOVAL N/A 02/03/2015   Procedure: REMOVAL PORT-A-CATH;  Surgeon: [name], MD;  Location: [place];  Service: General;  Laterality: N/A;  . PORTACATH PLACEMENT N/A 03/01/2014   Procedure: INSERTION PORT-A-CATH;  Surgeon: [name], MD;  Location: [place];  Service: General;  Laterality: N/A;  . PORTACATH PLACEMENT N/A 11/18/2018   Procedure: INSERTION PORT-A-CATH WITH ULTRASOUND;  Surgeon: [name], MD;  Location: [place];  Service: General;  Laterality: N/A;  . REMOVAL OF TISSUE EXPANDER AND PLACEMENT OF IMPLANT  Right 06/02/2019   Procedure: REMOVAL OF TISSUE EXPANDER RIGHT CHEST;  Surgeon: Irene Limbo, MD;  Location: [place];  Service: Plastics;  Laterality: Right;  . TONSILLECTOMY    . TUBAL LIGATION    . WOUND DEBRIDEMENT Left 06/02/2019   Procedure: LEFT CHEST DEBRIDEMENT;  Surgeon: Irene Limbo, MD;  Location: [place];  Service: Plastics;  Laterality: Left;    FAMILY HISTORY: Family History  Problem Relation Age of Onset  . Endometrial cancer Mother 35  . Hearing loss Mother   . Atrial fibrillation Mother   . Lung cancer Father   . Brain cancer Father   . Heart disease Maternal Aunt   . Atrial fibrillation Maternal Aunt   . Lung cancer Maternal Uncle   . Atrial fibrillation Maternal Uncle   . Stroke Maternal Grandmother   . Stroke Maternal Grandfather   . Bladder Cancer Maternal Uncle   . Multiple myeloma Maternal Uncle   . Other Son        trisomy 92   The patient's father died at the age of 66 from what may have been metastatic lung cancer. The patient knows very little about the father's side of her family. Her mother is alive at age 59. She was recently diagnosed with endometrial cancer. The patient has  one brother, 2 sisters. There is no history of breast or ovarian cancer in the family.   GYNECOLOGIC HISTORY:  Menarche age 72, first live birth age 13. The patient is GX P3. One child died shortly after birth. She stopped having periods approximately 2005. She did not use hormone replacement. 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 Son [name] is  Nurse, adult in [place]. Daughter [name] lives in [place] and works for [employer] as an Therapist, sports. She has three  grandchildren.    ADVANCED DIRECTIVES: Not in place   HEALTH MAINTENANCE: Social History   Tobacco Use  . Smoking status: Former Smoker    Packs/day: 0.50    Years: 15.00    Pack years: 7.50    Types: Cigarettes  . Smokeless tobacco: Never Used  . Tobacco comment: Quit smoking cigarettes in 2002  Substance Use Topics  . Alcohol use: Yes    Comment: social  . Drug use: No     Colonoscopy: 06/27/2014, normal, [name]  PAP: 2013  Bone density: 04/10/2015, -1.1 ([facility])  Lipid panel:  Allergies  Allergen Reactions  . Chlorhexidine Itching  . Ciprofloxacin Anxiety, Rash and Other (See Comments)    GI upset  . Prednisone Shortness Of Breath and Other (See Comments)    "jacks me up" jittery   . Codeine Nausea Only  . Cortisone Swelling and Other (See Comments)    "jack me up"     Current Outpatient Medications  Medication Sig Dispense Refill  . calcium carbonate (TUMS - DOSED IN MG ELEMENTAL CALCIUM) 500 MG chewable tablet Chew 1 tablet by mouth at bedtime as needed for indigestion or heartburn.    . capecitabine (XELODA) 500 MG tablet Take 3 tablets (1500 mg) twice daily after a meal for 14 days, then do not take for 7 days 70 tablet 8  . diltiazem (CARDIZEM CD) 240 MG 24 hr capsule Take 1 capsule (240 mg total) by mouth daily. 90 capsule 3  . liothyronine (CYTOMEL) 5 MCG tablet Take 5 mcg by mouth daily before breakfast.     . methocarbamol (ROBAXIN) 500 MG tablet Take 1 tablet (500 mg total) by mouth every 8 (eight) hours as needed for muscle spasms. 30 tablet 0  . omeprazole (PRILOSEC) 20 MG capsule Take 1 capsule (20 mg total) by mouth 2 (two) times daily before a meal. (Patient taking differently: Take 20 mg by mouth 2 (two) times daily as needed (for heartburn or acid reflux). ) 60 capsule 6  . ondansetron (ZOFRAN) 8 MG tablet Take 1 tablet (8 mg total) by mouth every 8 (eight) hours as needed for nausea or vomiting. (Patient not taking: Reported on 03/30/2019) 20  tablet 0  . oxyCODONE (OXY IR/ROXICODONE) 5 MG immediate release tablet Take 1-2 tablets (5-10 mg total) by mouth every 4 (four) hours as needed for moderate pain. 40 tablet 0  . potassium chloride (K-DUR) 10 MEQ tablet Take 1 tablet (10 mEq total) by mouth 2 (two) times daily. (Patient taking differently: Take 10 mEq by mouth daily. ) 80 tablet 2  . Prenatal Vit-Fe Fumarate-FA (PRENATAL PO) Take 1 tablet by mouth daily.    . propranolol (INDERAL) 10 MG tablet Take 10 mg by mouth daily as needed (  for heart palpitations).     . TIROSINT 100 MCG CAPS Take 100 mcg by mouth daily before breakfast.   4  . triamterene-hydrochlorothiazide (MAXZIDE-25) 37.5-25 MG tablet Take 1 tablet by mouth daily. 90 tablet 3   No current facility-administered medications for this visit.     OBJECTIVE: Middle-aged white woman hugging a pillow to her right chest wall for comfort  Vitals:   06/09/19 1338  BP: 127/85  Pulse: 89  Resp: 18  Temp: 98.3 F (36.8 C)  SpO2: 100%   Wt Readings from Last 3 Encounters:  06/09/19 193 lb 3.2 oz (87.6 kg)  06/02/19 196 lb 3.4 oz (89 kg)  05/11/19 196 lb 6.4 oz (89.1 kg)   Body mass index is 32.15 kg/m.    ECOG FS:1 - Symptomatic but completely ambulatory  Ocular: Sclerae unicteric, pupils round and equal Ear-nose-throat: Wearing a mask Lymphatic: No cervical or supraclavicular adenopathy Lungs no rales or rhonchi Heart regular rate and rhythm Abd soft, nontender, positive bowel sounds MSK no focal spinal tenderness, no joint edema Neuro: non-focal, well-oriented, appropriate affect Breasts: Status post bilateral mastectomies.  On the right the expander has been removed.  The skin is clear.  The incision from the recent surgery is flat, not swollen, not erythematous, not tender.  There is a drain on the right with less than 15 cc serosanguineous fluid in the bulb the left breast has an expander in place.  There is no evidence of chest wall recurrence.  Both axillae  are benign.    LAB RESULTS: No results found for: SPEP  Lab Results  Component Value Date   WBC 9.9 06/09/2019   NEUTROABS 5.8 06/09/2019   HGB 10.8 (L) 06/09/2019   HCT 33.8 (L) 06/09/2019   MCV 98.3 06/09/2019   PLT 411 (H) 06/09/2019      Chemistry      Component Value Date/Time   NA 136 06/09/2019 1326   NA 140 06/19/2017 1312   K 3.4 (L) 06/09/2019 1326   K 3.5 06/19/2017 1312   CL 99 06/09/2019 1326   CO2 25 06/09/2019 1326   CO2 25 06/19/2017 1312   BUN 12 06/09/2019 1326   BUN 18.4 06/19/2017 1312   CREATININE 0.79 06/09/2019 1326   CREATININE 0.8 06/19/2017 1312      Component Value Date/Time   CALCIUM 9.5 06/09/2019 1326   CALCIUM 10.2 06/19/2017 1312   ALKPHOS 91 06/09/2019 1326   ALKPHOS 86 06/19/2017 1312   AST 69 (H) 06/09/2019 1326   AST 15 06/19/2017 1312   ALT 40 06/09/2019 1326   ALT 22 06/19/2017 1312   BILITOT 0.3 06/09/2019 1326   BILITOT 0.26 06/19/2017 1312       No results found for: LABCA2  No components found for: LABCA125  No results for input(s): INR in the last 168 hours.  Urinalysis    Component Value Date/Time   COLORURINE YELLOW 03/13/2009 2251   APPEARANCEUR CLOUDY (A) 03/13/2009 2251   LABSPEC 1.020 06/15/2014 1101   PHURINE 6.0 06/15/2014 1101   PHURINE 5.5 03/13/2009 2251   GLUCOSEU Negative 06/15/2014 1101   HGBUR Negative 06/15/2014 1101   HGBUR NEGATIVE 03/13/2009 2251   BILIRUBINUR Color Interference 06/15/2014 1101   KETONESUR Negative 06/15/2014 1101   KETONESUR NEGATIVE 03/13/2009 2251   PROTEINUR 30 06/15/2014 1101   PROTEINUR NEGATIVE 03/13/2009 2251   UROBILINOGEN 0.2 06/15/2014 1101   NITRITE Negative 06/15/2014 1101   NITRITE NEGATIVE 03/13/2009 2251   LEUKOCYTESUR Trace  06/15/2014 1101    STUDIES: No results found.  ASSESSMENT: 58 y.o. [place] woman status post left breast upper inner quadrant biopsy 02/08/2014 for a clinical T1c N0, stage IA invasive ductal carcinoma, grade 3, triple  negative, with an MIB-1 of 50%.  (1) status post left lumpectomy and sentinel lymph node sampling 03/01/2014 for a pT1c pN0, stage IA invasive ductal carcinoma, grade 3, with negative margins, repeat prognostic panel again triple negative.  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  Left breast with breath hold technique/ 45 Gy at 1.8 Gy per fraction x 25 fractions.   Left breast boost/ 16 Gy at 2 Gy per fraction x 8 fractions  (4) genetics counseling 07/06/2015 through the Breast/Ovarian cancer gene panel offered by [facility] found no deleterious mutations in ATM, BARD1, BRCA1, BRCA2, BRIP1, CDH1, CHEK2, EPCAM, FANCC, MLH1, MSH2, MSH6, NBN, PALB2, PMS2, PTEN, RAD51C, RAD51D, TP53, and XRCC2.  (5) question of sleep apnea  (6) transverse colon lesion noted on CT scan from 06/17/2014 - negative biopsy, also showed fatty liver  (a) repeat CT scan of the abdomen and pelvis with contrast 02/15/2016 negative  RECURRENT DISEASE: February 2020 (7) biopsy of palpable mass upper inner left breast 11/10/2018 confirms invasive ductal carcinoma, grade 2 or 3, triple negative, with an MIB-1 of 80%.  (a) CT scans of the chest abdomen and pelvis with contrast showed no stage IV disease  (b) bone scan 11/13/2018 shows no definite metastasis to bone  (8) neoadjuvant chemotherapy consisting of carboplatin and gemcitabine given days 1 and 8 of each 21-day cycle, for 6 cycles, started 11/26/2018, completed 03/18/2019  (a) repeat ultrasound 01/25/2019 (after 3 cycles) shows no  change in measurable breast mass   (9) bilateral mastectomies as follows   (a) left mastectomy and sentinel lymph node sampling with immediate tissue expander placement and latissimus flap 04/12/2019 showed a residual ypT1a ypN0 invasive ductal carcinoma, grade 3, with negative margins; a single sentinel lymph node was removed.  Margins were negative  (b) right mastectomy with immediate implant placement showed no malignancy  (c) right expander removed 06/02/2019 secondary to infectious complications  (10) molecular testing on the 11/10/2018 biopsy showed negative P D-L1, negative androgen receptor, stable MSI and proficient mismatch repair status.  The tumor mutational burden was low.  No PIK3 mutation was detected.  There was a pathogenic TP 53 variant and negative ER PR and HER-2 were confirmed.  (11) considering capecitabine versus Keytruda versus both versus observation   PLAN: Masa continues to have various mishaps, the most recent one of course being the right expander infection.  That has been taken care of with removal of the expander and antibiotics and at present looks fine.  She is completing her Augmentin after 1 more day.  She has not had a fever for several days.  She is very motivated to start the pembrolizumab today.  We again reviewed the possible toxicities side effects and complications of this agent as well as the data and the lack of data regarding effectiveness in her situation  We also discussed capecitabine.  We have more data for that.  I  suggested that she would do well to consider that and she is very motivated to start capecitabine as well.  Accordingly I have written the prescription for her to start her capecitabine 3 weeks from now when she returns for her second pembrolizumab dose.  She will be on 1500 mg twice daily 14 days on and 7 days off.  She has a good understanding of the possible toxicities, side effects and complications of that agent.  The plan would  be for 6 months of that and 1 year of pembrolizumab  She will see me again 3 weeks from today for her second pembrolizumab cycle.  She will keep a side effect diary.  She knows to call for any other issue that may develop before that visit. Virgie Dad. Magrinat, MD 06/09/19 2:05 PM Medical Oncology and Hematology [facility] [address] [place], [place] [address] Tel. [phone]    Fax. [phone]  I, Jacqualyn Posey am acting as a Education administrator for Chauncey Cruel, MD.   I, Lurline Del MD, have reviewed the above documentation for accuracy and completeness, and I agree with the above.

## 2019-06-08 NOTE — Telephone Encounter (Signed)
RN placed call to pt to follow up on how she was feeling.  Last week pt reported fever and was going to be covid tested.  Pt states she tested negative for Covid and was taken for emergent breast surgery for an infection in her right breast.  Pt states she is feeling better and would like to keep her apt with Dr. Jana Hakim tomorrow.

## 2019-06-09 ENCOUNTER — Inpatient Hospital Stay: Payer: 59

## 2019-06-09 ENCOUNTER — Inpatient Hospital Stay: Payer: 59 | Attending: Oncology

## 2019-06-09 ENCOUNTER — Inpatient Hospital Stay (HOSPITAL_BASED_OUTPATIENT_CLINIC_OR_DEPARTMENT_OTHER): Payer: 59 | Admitting: Oncology

## 2019-06-09 ENCOUNTER — Other Ambulatory Visit: Payer: Self-pay

## 2019-06-09 VITALS — BP 127/85 | HR 89 | Temp 98.3°F | Resp 18 | Ht 65.0 in | Wt 193.2 lb

## 2019-06-09 DIAGNOSIS — C50212 Malignant neoplasm of upper-inner quadrant of left female breast: Secondary | ICD-10-CM | POA: Insufficient documentation

## 2019-06-09 DIAGNOSIS — Z171 Estrogen receptor negative status [ER-]: Secondary | ICD-10-CM | POA: Insufficient documentation

## 2019-06-09 DIAGNOSIS — C50912 Malignant neoplasm of unspecified site of left female breast: Secondary | ICD-10-CM

## 2019-06-09 DIAGNOSIS — Z5112 Encounter for antineoplastic immunotherapy: Secondary | ICD-10-CM | POA: Diagnosis present

## 2019-06-09 DIAGNOSIS — Z923 Personal history of irradiation: Secondary | ICD-10-CM | POA: Insufficient documentation

## 2019-06-09 DIAGNOSIS — Z95828 Presence of other vascular implants and grafts: Secondary | ICD-10-CM

## 2019-06-09 DIAGNOSIS — Z9221 Personal history of antineoplastic chemotherapy: Secondary | ICD-10-CM | POA: Diagnosis not present

## 2019-06-09 DIAGNOSIS — Z9013 Acquired absence of bilateral breasts and nipples: Secondary | ICD-10-CM | POA: Diagnosis not present

## 2019-06-09 LAB — CBC WITH DIFFERENTIAL/PLATELET
Abs Immature Granulocytes: 0.23 10*3/uL — ABNORMAL HIGH (ref 0.00–0.07)
Basophils Absolute: 0.1 10*3/uL (ref 0.0–0.1)
Basophils Relative: 1 %
Eosinophils Absolute: 0.5 10*3/uL (ref 0.0–0.5)
Eosinophils Relative: 5 %
HCT: 33.8 % — ABNORMAL LOW (ref 36.0–46.0)
Hemoglobin: 10.8 g/dL — ABNORMAL LOW (ref 12.0–15.0)
Immature Granulocytes: 2 %
Lymphocytes Relative: 24 %
Lymphs Abs: 2.4 10*3/uL (ref 0.7–4.0)
MCH: 31.4 pg (ref 26.0–34.0)
MCHC: 32 g/dL (ref 30.0–36.0)
MCV: 98.3 fL (ref 80.0–100.0)
Monocytes Absolute: 1 10*3/uL (ref 0.1–1.0)
Monocytes Relative: 10 %
Neutro Abs: 5.8 10*3/uL (ref 1.7–7.7)
Neutrophils Relative %: 58 %
Platelets: 411 10*3/uL — ABNORMAL HIGH (ref 150–400)
RBC: 3.44 MIL/uL — ABNORMAL LOW (ref 3.87–5.11)
RDW: 14.5 % (ref 11.5–15.5)
WBC: 9.9 10*3/uL (ref 4.0–10.5)
nRBC: 0 % (ref 0.0–0.2)

## 2019-06-09 LAB — CMP (CANCER CENTER ONLY)
ALT: 40 U/L (ref 0–44)
AST: 69 U/L — ABNORMAL HIGH (ref 15–41)
Albumin: 3.6 g/dL (ref 3.5–5.0)
Alkaline Phosphatase: 91 U/L (ref 38–126)
Anion gap: 12 (ref 5–15)
BUN: 12 mg/dL (ref 6–20)
CO2: 25 mmol/L (ref 22–32)
Calcium: 9.5 mg/dL (ref 8.9–10.3)
Chloride: 99 mmol/L (ref 98–111)
Creatinine: 0.79 mg/dL (ref 0.44–1.00)
GFR, Est AFR Am: >60 mL/min (ref >60)
GFR, Estimated: >60 mL/min (ref >60)
Glucose, Bld: 99 mg/dL (ref 70–99)
Potassium: 3.4 mmol/L — ABNORMAL LOW (ref 3.5–5.1)
Sodium: 136 mmol/L (ref 135–145)
Total Bilirubin: 0.3 mg/dL (ref 0.3–1.2)
Total Protein: 7.8 g/dL (ref 6.5–8.1)

## 2019-06-09 LAB — TSH: TSH: 5.293 u[IU]/mL — ABNORMAL HIGH (ref 0.308–3.960)

## 2019-06-09 MED ORDER — SODIUM CHLORIDE 0.9 % IV SOLN
200.0000 mg | Freq: Once | INTRAVENOUS | Status: AC
  Administered 2019-06-09: 200 mg via INTRAVENOUS
  Filled 2019-06-09: qty 8

## 2019-06-09 MED ORDER — CAPECITABINE 500 MG PO TABS: ORAL_TABLET | ORAL | 8 refills | Status: DC

## 2019-06-09 MED ORDER — SODIUM CHLORIDE 0.9% FLUSH
10.0000 mL | Freq: Once | INTRAVENOUS | Status: AC
  Administered 2019-06-09: 13:00:00 10 mL
  Filled 2019-06-09: qty 10

## 2019-06-09 MED ORDER — SODIUM CHLORIDE 0.9% FLUSH
10.0000 mL | INTRAVENOUS | Status: DC | PRN
  Administered 2019-06-09: 10 mL
  Filled 2019-06-09: qty 10

## 2019-06-09 MED ORDER — HEPARIN SOD (PORK) LOCK FLUSH 100 UNIT/ML IV SOLN
500.0000 [IU] | Freq: Once | INTRAVENOUS | Status: AC | PRN
  Administered 2019-06-09: 500 [IU]
  Filled 2019-06-09: qty 5

## 2019-06-09 MED ORDER — SODIUM CHLORIDE 0.9 % IV SOLN
Freq: Once | INTRAVENOUS | Status: AC
  Administered 2019-06-09: 15:00:00 via INTRAVENOUS
  Filled 2019-06-09: qty 250

## 2019-06-09 NOTE — Patient Instructions (Addendum)
[facility] Discharge Instructions for Patients Receiving Chemotherapy  Today you received the following chemotherapy agents: Pembrolizumab   To help prevent nausea and vomiting after your treatment, we encourage you to take your nausea medication as directed.    If you develop nausea and vomiting that is not controlled by your nausea medication, call the clinic.   BELOW ARE SYMPTOMS THAT SHOULD BE REPORTED IMMEDIATELY:  *FEVER GREATER THAN 100.5 F  *CHILLS WITH OR WITHOUT FEVER  NAUSEA AND VOMITING THAT IS NOT CONTROLLED WITH YOUR NAUSEA MEDICATION  *UNUSUAL SHORTNESS OF BREATH  *UNUSUAL BRUISING OR BLEEDING  TENDERNESS IN MOUTH AND THROAT WITH OR WITHOUT PRESENCE OF ULCERS  *URINARY PROBLEMS  *BOWEL PROBLEMS  UNUSUAL RASH Items with * indicate a potential emergency and should be followed up as soon as possible.  Feel free to call the clinic should you have any questions or concerns. The clinic phone number is [phone].  Please show the [facility] at check-in to the Emergency Department and triage nurse.  Pembrolizumab injection What is this medicine? PEMBROLIZUMAB (pem broe liz ue mab) is a monoclonal antibody. It is used to treat bladder cancer, cervical cancer, endometrial cancer, esophageal cancer, head and neck cancer, hepatocellular cancer, Hodgkin lymphoma, kidney cancer, lymphoma, melanoma, Merkel cell carcinoma, lung cancer, stomach cancer, urothelial cancer, and cancers that have a certain genetic condition. This medicine may be used for other purposes; ask your health care provider or pharmacist if you have questions. COMMON BRAND NAME(S): Keytruda What should I tell my health care provider before I take this medicine? They need to know if you have any of these conditions:  diabetes  immune system problems  inflammatory bowel disease  liver disease  lung or breathing disease  lupus  received or scheduled to receive an organ  transplant or a stem-cell transplant that uses donor stem cells  an unusual or allergic reaction to pembrolizumab, other medicines, foods, dyes, or preservatives  pregnant or trying to get pregnant  breast-feeding How should I use this medicine? This medicine is for infusion into a vein. It is given by a health care professional in a hospital or clinic setting. A special MedGuide will be given to you before each treatment. Be sure to read this information carefully each time. Talk to your pediatrician regarding the use of this medicine in children. While this drug may be prescribed for selected conditions, precautions do apply. Overdosage: If you think you have taken too much of this medicine contact a poison control center or emergency room at once. NOTE: This medicine is only for you. Do not share this medicine with others. What if I miss a dose? It is important not to miss your dose. Call your doctor or health care professional if you are unable to keep an appointment. What may interact with this medicine? Interactions have not been studied. Give your health care provider a list of all the medicines, herbs, non-prescription drugs, or dietary supplements you use. Also tell them if you smoke, drink alcohol, or use illegal drugs. Some items may interact with your medicine. This list may not describe all possible interactions. Give your health care provider a list of all the medicines, herbs, non-prescription drugs, or dietary supplements you use. Also tell them if you smoke, drink alcohol, or use illegal drugs. Some items may interact with your medicine. What should I watch for while using this medicine? Your condition will be monitored carefully while you are receiving this medicine. You  may need blood work done while you are taking this medicine. Do not become pregnant while taking this medicine or for 4 months after stopping it. Women should inform their doctor if they wish to become  pregnant or think they might be pregnant. There is a potential for serious side effects to an unborn child. Talk to your health care professional or pharmacist for more information. Do not breast-feed an infant while taking this medicine or for 4 months after the last dose. What side effects may I notice from receiving this medicine? Side effects that you should report to your doctor or health care professional as soon as possible:  allergic reactions like skin rash, itching or hives, swelling of the face, lips, or tongue  bloody or black, tarry  breathing problems  changes in vision  chest pain  chills  confusion  constipation  cough  diarrhea  dizziness or feeling faint or lightheaded  fast or irregular heartbeat  fever  flushing  hair loss  joint pain  low blood counts - this medicine may decrease the number of white blood cells, red blood cells and platelets. You may be at increased risk for infections and bleeding.  muscle pain  muscle weakness  persistent headache  redness, blistering, peeling or loosening of the skin, including inside the mouth  signs and symptoms of high blood sugar such as dizziness; dry mouth; dry skin; fruity breath; nausea; stomach pain; increased hunger or thirst; increased urination  signs and symptoms of kidney injury like trouble passing urine or change in the amount of urine  signs and symptoms of liver injury like dark urine, light-colored stools, loss of appetite, nausea, right upper belly pain, yellowing of the eyes or skin  sweating  swollen lymph nodes  weight loss Side effects that usually do not require medical attention (report to your doctor or health care professional if they continue or are bothersome):  decreased appetite  muscle pain  tiredness This list may not describe all possible side effects. Call your doctor for medical advice about side effects. You may report side effects to FDA at  1-800-FDA-1088. Where should I keep my medicine? This drug is given in a hospital or clinic and will not be stored at home. NOTE: This sheet is a summary. It may not cover all possible information. If you have questions about this medicine, talk to your doctor, pharmacist, or health care provider.  2020 Elsevier/Gold Standard (2018-10-13 13:46:58)

## 2019-06-10 ENCOUNTER — Telehealth: Payer: Self-pay | Admitting: Oncology

## 2019-06-10 ENCOUNTER — Telehealth: Payer: Self-pay

## 2019-06-10 ENCOUNTER — Telehealth: Payer: Self-pay | Admitting: Pharmacist

## 2019-06-10 ENCOUNTER — Telehealth: Payer: Self-pay | Admitting: *Deleted

## 2019-06-10 DIAGNOSIS — C50212 Malignant neoplasm of upper-inner quadrant of left female breast: Secondary | ICD-10-CM

## 2019-06-10 DIAGNOSIS — Z171 Estrogen receptor negative status [ER-]: Secondary | ICD-10-CM

## 2019-06-10 MED ORDER — CAPECITABINE 500 MG PO TABS: ORAL_TABLET | ORAL | 7 refills | Status: DC

## 2019-06-10 NOTE — Telephone Encounter (Signed)
Called pt to see how she did with her treatment of Keytruda yesterday.  She reports low grade fever of @ 99 last night & had some lower back pain which she reports as "bone pain".  She took Tylenol & Ibuprofen which helped.  She denies any other symptoms.  She has not started xeloda yet & will start with next cycle. She is wondering if she needs to increase her Thyroid med-Tirosint since her TSH has risen.  She thinks that she should & that it would make her feel better.  Message routed to Dr Magrinat/Pod RN

## 2019-06-10 NOTE — Telephone Encounter (Signed)
I talk with patient regarding schedule  

## 2019-06-10 NOTE — Telephone Encounter (Signed)
Oral Oncology Patient Advocate Encounter  Prior Authorization for Xeloda has been approved.    PA# [id number] Effective dates: 06/10/19 through 06/09/20  Oral Oncology Clinic will continue to follow.   [place] Patient [place] Phone [phone] Fax [phone] 06/10/2019    11:39 AM

## 2019-06-10 NOTE — Telephone Encounter (Signed)
-----   Message from Lillia Corporal, RN sent at 06/09/2019  4:01 PM EDT ----- Regarding: Dr. Jana Hakim new treatment First time treatment with [facility]. Not new to chemo.

## 2019-06-10 NOTE — Telephone Encounter (Addendum)
Oral Chemotherapy Pharmacist Encounter   I spoke with patient for overview of: Xeloda (capecitabine) for the extended adjuvant treatment of recurrent, triple negative breast cancer in conjunction with pembrolizumab, planned duration 6 months of capecitabine and 1 year of pembrolizumab.  Counseled patient on administration, dosing, side effects, monitoring, drug-food interactions, safe handling, storage, and disposal.  Patient will take Xeloda 500mg  tablets, 3 tablets (1500mg ) by mouth in AM and 3 tabs (1500mg ) by mouth in PM, within 30 minutes of finishing meals, for 14 days on, 7 days off, repeated every 21 days.   Pembrolizumab is currently being given at 200 mg IV every 3 weeks  Xeloda start date: 06/30/19 Keytruda started 06/09/19  Adverse effects of Xeloda include but are not limited to: fatigue, decreased blood counts, GI upset, diarrhea, mouth sores, and hand-foot syndrome.  Adverse effects of Keytruda will be immune-mediated in nature and include but are not limited to: dermatitis, colitis, pneumonitis, and thyroid dysfunction.  Patient has anti-emetic on hand and knows to take it if nausea develops.   Patient will obtain anti diarrheal and alert the office of 4 or more loose stools above baseline.  Reviewed with patient importance of keeping a medication schedule and plan for any missed doses.  Medication reconciliation performed and medication/allergy list updated. Patient will discontinue her multivitamin with folate while on therapy with capecitabine.  Insurance authorization for Xeloda has been obtained. Prescription for [facility] per insurance requirement. Patient updated about dispensing pharmacy.  All questions answered.  Ms. Coney voiced understanding and appreciation.   Patient knows to call the office with questions or concerns.  Johny Drilling, PharmD, BCPS, BCOP  06/10/2019   3:05 PM Oral Oncology Clinic [phone]

## 2019-06-10 NOTE — Telephone Encounter (Signed)
Oral Oncology Pharmacist Encounter  Received notification that insurance authorization for Xeloda (capecitabine) has been approved. Capecitabine prescription must [facility] per insurance requirement. [facility]pec[facility]abine [facility] has [facility] [place], [place].  Patient will be updated about dispensing pharmacy during initial counseling session.  Johny Drilling, PharmD, BCPS, BCOP  06/10/2019 11:49 AM Oral Oncology Clinic [phone]

## 2019-06-10 NOTE — Telephone Encounter (Signed)
Oral Oncology Patient Advocate Encounter  Received notification from [facility] that prior authorization for Xeloda is required.  PA submitted on CoverMyMeds Key [id number] Status is pending  Oral Oncology Clinic will continue to follow.  [place] Patient [place] Phone [phone] Fax [phone] 06/10/2019    9:29 AM

## 2019-06-10 NOTE — Telephone Encounter (Signed)
Oral Oncology Pharmacist Encounter  Received new prescription for Xeloda (capecitabine) for the extended adjuvant treatment of recurrent, triple negative breast cancer in conjunction with pembrolizumab, planned duration 6 months of capecitabine and 1 year of pembrolizumab.  Pembrolizumab was initiated on 06/09/2019 Capecitabine is planned to start on 06/24/2019 with 2nd dose of pembrolizumab Combination therapy is based on positive clinical data in [facility] patients showing benefit and an acceptable tolerability profile (J Immunotherap Canc 2020; [id number])  Original diagnosis in 2015 with triple negative stage IA disease Patient received lumpectomy and SLN sampling on 03/01/2014, followed by adjuvant dose dense doxorubicin/cyclophosphamide x 4 cycles (03/25/14-05/06/14), followed by paclitaxel x 1 and replaced with Abraxane x4 more cycles (05/27/14-07/15/14) Patient then received adjuvant chest wall radiation (08/18/18-10/07/2018)  Localized recurrent disease noted in Feb 2020 and patient received neoadjuvant treatment with gemcitabine and carboplatin x 6 cycles (11/26/18-03/18/19), followed by bilateral mastectomies  Patient is now under evaluation to initiate therapy with capecitabine at 750 mg/m2 (1500 mg) by mouth 2 times daily for 14 days on, 7 days off, repeated every 21 days x 8 planned cycles Pembrolizumab is currently being given at 200 mg IV every 3 weeks  Labs from 06/09/19 assessed, OK for treatment initiation.  Current medication list in Epic reviewed, moderate DDIs with capecitabine identified:  Category C interaction with capecitabine and omelprazole: conflicting retrospective data with one analysis showing decrease in OS and PFS when Xeloda was used for adjuvant treatment of gastric cancer concurrently with PPI, another study showing no effect on important efficacy outcomes. Patient will be screened for ability to discontinue PPI therapy. If unable to d/c PPI, no change to treatment is  indicated.  Most multivitamins contain folic acid, which is a category C interaction.  Folic acid may enhance the adverse or toxic effect of fluorouracil products by potentiating its mechanism of action.  Patient will be instructed to hold multivitamin during capecitabine administration, and it can be restarted at the completion of therapy.  Prescription has been e-scribed to the [facility] for benefits analysis and approval by MD. Patient noted with [employer] commercial prescription coverage, prescription will likely will [facility], prescription will be processed accordingly.  Oral Oncology Clinic will continue to follow for insurance authorization, copayment issues, initial counseling and start date.  Johny Drilling, PharmD, BCPS, BCOP  06/10/2019 7:37 AM Oral Oncology Clinic [phone]

## 2019-06-11 ENCOUNTER — Encounter: Payer: 59 | Admitting: Rehabilitation

## 2019-06-17 ENCOUNTER — Encounter: Payer: 59 | Admitting: Family Medicine

## 2019-06-17 ENCOUNTER — Other Ambulatory Visit: Payer: Self-pay | Admitting: Oncology

## 2019-06-17 ENCOUNTER — Ambulatory Visit (INDEPENDENT_AMBULATORY_CARE_PROVIDER_SITE_OTHER): Payer: 59 | Admitting: Family Medicine

## 2019-06-17 ENCOUNTER — Encounter: Payer: Self-pay | Admitting: Family Medicine

## 2019-06-17 ENCOUNTER — Other Ambulatory Visit: Payer: Self-pay

## 2019-06-17 DIAGNOSIS — R7303 Prediabetes: Secondary | ICD-10-CM

## 2019-06-17 DIAGNOSIS — E039 Hypothyroidism, unspecified: Secondary | ICD-10-CM

## 2019-06-17 NOTE — Telephone Encounter (Signed)
Oral Oncology Patient Advocate Encounter  Conf[facility] with [facility] that Xeloda was shipped 9/16 to be delivered 9/17 with a $0 copay.  [facility] Patient [place] Phone [phone] Fax [phone] 06/17/2019   9:50 AM

## 2019-06-17 NOTE — Progress Notes (Signed)
Chief Complaint  Patient presents with  . Discuss thyroid    Subjective: Patient is a 58 y.o. female here for transitioning care for thyroid and prediabetes. Due to COVID-19 pandemic, we are interacting via web portal for an electronic face-to-face visit. I verified patient's ID using 2 identifiers. Patient agreed to proceed with visit via this method. Patient is at home, I am at office. Patient and I are present for visit.   Patient has a history of hypothyroidism.  She is currently on Cytomel 5 mcg daily and levothyroxine 112 mcg daily.  This was recently increased 4 days ago.  She has been getting frequent lab draws for her breast cancer treatments.  She was told that some of the medications she would be taking for chemotherapy could affect her thyroid levels.  Her previous provider who is managing this was not through the cold system so it was logistically difficult to have this managed.  Patient has a history of prediabetes.  Her last A1c was in July of this year and was 5.  She has had a few elevated sugar readings, though it is unclear if she was fasting or not.  ROS: Endo: No wt gain  Past Medical History:  Diagnosis Date  . Anxiety   . Arthritis   . Back pain   . Breast cancer ([place])    left  . Fatty liver   . GERD (gastroesophageal reflux disease)   . Headache(784.0)    PMH : migraines  . History of kidney stones   . Hypercholesterolemia   . Hypertension   . Hypothyroidism   . Kidney stones   . Osteoporosis   . Personal history of chemotherapy   . Personal history of radiation therapy   . PONV (postoperative nausea and vomiting)    difficult to wake up  . Pre-diabetes   . Psoriasis   . PVC's (premature ventricular contractions)   . Radiation 08/18/14-10/07/14   Left breast    Objective: No conversational dyspnea Age appropriate judgment and insight Nml affect and mood  Assessment and Plan: Hypothyroidism, unspecified type  Prediabetes  1- happy to take over  #1. 2- will monitor #2 w A1c, I don't think she needs therapy at this time for an A1c of 5.0. The patient voiced understanding and agreement to the plan.  [place], DO 06/17/19  12:03 PM

## 2019-06-24 ENCOUNTER — Ambulatory Visit: Payer: 59 | Admitting: Oncology

## 2019-06-24 ENCOUNTER — Other Ambulatory Visit: Payer: 59

## 2019-06-29 NOTE — Progress Notes (Signed)
[facility]  [phone] [phone]    ID: [name] OB: 1961-09-03  MR#: [id number]  GUR#:[id number]  Patient Care Team: [name], DO as PCP - General (Family Medicine) Josue Hector, MD as PCP - Cardiology (Cardiology) Magrinat, Virgie Dad, MD as Consulting Physician (Oncology) Brien Few, MD as Consulting Physician (Obstetrics and Gynecology) [name], MD as Consulting Physician (General Surgery) Delrae Rend, MD as Consulting Physician (Endocrinology) Wilford Corner, MD as Consulting Physician (Gastroenterology) Dorna Leitz, MD as Consulting Physician (Orthopedic Surgery) Josue Hector, MD as Consulting Physician (Cardiology) Irene Limbo, MD as Consulting Physician (Plastic Surgery) OTHER MD:    CHIEF COMPLAINT: Triple negative breast cancer, locally recurrent (s/p bilateral mastectomies)  CURRENT TREATMENT: adjuvant capecitabine/pembrolizumab   INTERVAL HISTORY: Aurelie returns today for follow-up and treatment of her locally recurrent triple negative breast cancer.   She was started on Pembrolizumab and receives this every 3 weeks.  She tolerates this well.     REVIEW OF SYSTEMS: Zeinab is doing moderately well today.  She remains fatigued.  She says that she struggles to clean her house all at one time.  She has some mild difficulty sleeping.  Her weight remains stable, though she notes she would like to lose some weight.  She denies any fever or chills.  She is without any nausea, vomiting, bowel, bladder changes.  She has no cough, shortness of breath, chest pain or palpitations.  A detailed ROS was otherwise non contributory.     BREAST CANCER HISTORY: From the original intake note of 02/16/2014:  [name] palpated a mass in her left breast early May and brought it immediately to her gynecologist attention. He set her up for bilateral diagnostic mammography and left ultrasonography  at [facility] 02/07/2014. Mammography showed a 1.3 cm oval mass with spiculated margins in the left breast at the 11:00 position. This was palpable. Ultrasound showed a 1.1 cm tall her than wide mass with a microlobulated margins. He was hypoechoic. There were no abnormalities noted in the left axilla.  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 Lyndon Code at conference 02/16/2014), triple negative, with an MIB-1 of 50%.  The patient's subsequent history is as detailed below   PAST MEDICAL HISTORY: Past Medical History:  Diagnosis Date   Anxiety    Arthritis    Back pain    Breast cancer ([place])    left   Fatty liver    GERD (gastroesophageal reflux disease)    Headache(784.0)    PMH : migraines   History of kidney stones    Hypercholesterolemia    Hypertension    Hypothyroidism    Kidney stones    Osteoporosis    Personal history of chemotherapy    Personal history of radiation therapy    PONV (postoperative nausea and vomiting)    difficult to wake up   Pre-diabetes    Psoriasis    PVC's (premature ventricular contractions)    Radiation 08/18/14-10/07/14   Left breast    PAST SURGICAL HISTORY: Past Surgical History:  Procedure Laterality Date   BREAST BIOPSY Left 11/10/2018   BREAST LUMPECTOMY Left 2015   BREAST LUMPECTOMY WITH AXILLARY LYMPH NODE BIOPSY  6/15   left   BREAST RECONSTRUCTION WITH PLACEMENT OF TISSUE EXPANDER AND ALLODERM Bilateral 04/12/2019   Procedure: BILATERAL BREAST RECONSTRUCTION WITH PLACEMENT OF TISSUE EXPANDERS; ALLODERM TO RIGHT CHEST;  Surgeon: Irene Limbo, MD;  Location: [facility];  Service: Plastics;  Laterality: Bilateral;   CARDIAC CATHETERIZATION     CHOLECYSTECTOMY     COLONOSCOPY N/A 06/27/2014   Procedure: COLONOSCOPY;  Surgeon: Lear Ng, MD;  Location: [facility];  Service: Endoscopy;   Laterality: N/A;   LATISSIMUS FLAP TO BREAST Left 04/12/2019   Procedure: LEFT LATISSIMUS DORSI FLAP TO LEFT CHEST;  Surgeon: Irene Limbo, MD;  Location: [facility];  Service: Plastics;  Laterality: Left;   LEFT HEART CATH AND CORONARY ANGIOGRAPHY N/A 07/24/2018   Procedure: LEFT HEART CATH AND CORONARY ANGIOGRAPHY;  Surgeon: Troy Sine, MD;  Location: [facility];  Service: Cardiovascular;  Laterality: N/A;   LIPOMA EXCISION     MASTECTOMY W/ SENTINEL NODE BIOPSY Bilateral 04/12/2019   Procedure: RIGHT BREAST RISK REDUCING MASTECTOMY; LEFT MASTECTOMY WITH LEFT AXILLARY SENTINEL LYMPH NODE BIOPSY WITH BLUE DYE INJECTION;  Surgeon: [name], MD;  Location: [facility];  Service: General;  Laterality: Bilateral;   MYOMECTOMY     PORT-A-CATH REMOVAL N/A 02/03/2015   Procedure: REMOVAL PORT-A-CATH;  Surgeon: [name], MD;  Location: [facility];  Service: General;  Laterality: N/A;   PORTACATH PLACEMENT N/A 03/01/2014   Procedure: INSERTION PORT-A-CATH;  Surgeon: [name], MD;  Location: [facility];  Service: General;  Laterality: N/A;   PORTACATH PLACEMENT N/A 11/18/2018   Procedure: INSERTION PORT-A-CATH WITH ULTRASOUND;  Surgeon: [name], MD;  Location: [facility];  Service: General;  Laterality: N/A;   REMOVAL OF TISSUE EXPANDER AND PLACEMENT OF IMPLANT Right 06/02/2019   Procedure: REMOVAL OF TISSUE EXPANDER RIGHT CHEST;  Surgeon: Irene Limbo, MD;  Location: [facility];  Service: Plastics;  Laterality: Right;   TONSILLECTOMY     TUBAL LIGATION     WOUND DEBRIDEMENT Left 06/02/2019   Procedure: LEFT CHEST DEBRIDEMENT;  Surgeon: Irene Limbo, MD;  Location: [facility];  Service: Plastics;  Laterality: Left;    FAMILY HISTORY: Family History  Problem Relation Age of Onset   Endometrial cancer Mother 22   Hearing loss Mother    Atrial fibrillation Mother    Lung cancer Father     Brain cancer Father    Heart disease Maternal Aunt    Atrial fibrillation Maternal Aunt    Lung cancer Maternal Uncle    Atrial fibrillation Maternal Uncle    Stroke Maternal Grandmother    Stroke Maternal Grandfather    Bladder Cancer Maternal Uncle    Multiple myeloma Maternal Uncle    Other Son        trisomy 54   The patient's father died at the age of 34 from what may have been metastatic lung cancer. The patient knows very little about the father's side of her family. Her mother is alive at age 55. She was recently diagnosed with endometrial cancer. The patient has one brother, 2 sisters. There is no history of breast or ovarian cancer in the family.   GYNECOLOGIC HISTORY:  Menarche age 60, first live birth age 58. The patient is GX P3. One child died shortly after birth. She stopped having periods approximately 2005. She did not use hormone replacement. 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 Son [name]  is  Nurse, adult in [place]. Daughter [name] lives in [place] and works for [employer] as an Therapist, sports. She has three grandchildren.    ADVANCED DIRECTIVES: Not in place   HEALTH MAINTENANCE: Social History   Tobacco Use   Smoking status: Former Smoker    Packs/day: 0.50    Years: 15.00    Pack years: 7.50    Types: Cigarettes   Smokeless tobacco: Never Used   Tobacco comment: Quit smoking cigarettes in 2002  Substance Use Topics   Alcohol use: Yes    Comment: social   Drug use: No     Colonoscopy: 06/27/2014, normal, [name]  PAP: 2013  Bone density: 04/10/2015, -1.1 ([facility])  Lipid panel:  Allergies  Allergen Reactions   Chlorhexidine Itching   Ciprofloxacin  Anxiety, Rash and Other (See Comments)    GI upset   Prednisone Shortness Of Breath and Other (See Comments)    "jacks me up" jittery    Codeine Nausea Only   Cortisone Swelling and Other (See Comments)    "jack me up"     Current Outpatient Medications  Medication Sig Dispense Refill   calcium carbonate (TUMS - DOSED IN MG ELEMENTAL CALCIUM) 500 MG chewable tablet Chew 1 tablet by mouth at bedtime as needed for indigestion or heartburn.     capecitabine (XELODA) 500 MG tablet Take 3 tablets (1500 mg) by mouth twice daily, immediately after meals. Take for 14 days on, 7 days off, repeat every 21 days 84 tablet 7   Levothyroxine Sodium (TIROSINT) 112 MCG CAPS Take 1 capsule (112 mcg total) by mouth daily before breakfast. 30 capsule    liothyronine (CYTOMEL) 5 MCG tablet Take 5 mcg by mouth daily before breakfast.      methocarbamol (ROBAXIN) 500 MG tablet Take 1 tablet (500 mg total) by mouth every 8 (eight) hours as needed for muscle spasms. 30 tablet 0   omeprazole (PRILOSEC) 20 MG capsule Take 1 capsule (20 mg total) by mouth 2 (two) times daily before a meal. (Patient taking differently: Take 20 mg by mouth 2 (two) times daily as needed (for heartburn or acid reflux). ) 60 capsule 6   ondansetron (ZOFRAN) 8 MG tablet Take 1 tablet (8 mg total) by mouth every 8 (eight) hours as needed for nausea or vomiting. 20 tablet 0   oxyCODONE (OXY IR/ROXICODONE) 5 MG immediate release tablet Take 1-2 tablets (5-10 mg total) by mouth every 4 (four) hours as needed for moderate pain. 40 tablet 0   potassium chloride (K-DUR) 10 MEQ tablet TAKE 1 TABLET (10 MEQ TOTAL) BY MOUTH 2 (TWO) TIMES DAILY. 80 tablet 2   propranolol (INDERAL) 10 MG tablet Take 10 mg by mouth daily as needed (for heart palpitations).      triamterene-hydrochlorothiazide (MAXZIDE-25) 37.5-25 MG tablet Take 1 tablet by mouth daily. 90 tablet 3   diltiazem (CARDIZEM CD) 240 MG 24 hr capsule Take 1 capsule (240 mg total)  by mouth daily. 90 capsule 3   Prenatal Vit-Fe Fumarate-FA (PRENATAL PO) Take 1 tablet by mouth daily.     No current facility-administered medications for this visit.     OBJECTIVE:   Vitals:   06/30/19 0815  BP: 133/80  Pulse: 74  Resp: 18  Temp: 98.2 F (36.8 C)  SpO2: 98%   Wt Readings from Last 3 Encounters:  06/30/19 196 lb 6.4 oz (89.1 kg)  06/09/19 193 lb 3.2 oz (87.6 kg)  06/02/19 196 lb 3.4 oz (89  kg)   Body mass index is 32.68 kg/m.    ECOG FS:1 - Symptomatic but completely ambulatory GENERAL: Patient is a well appearing female in no acute distress HEENT:  Sclerae anicteric.  Mask in place.  Neck is supple.  NODES:  No cervical, supraclavicular, or axillary lymphadenopathy palpated.  BREAST EXAM:  Deferred. LUNGS:  Clear to auscultation bilaterally.  No wheezes or rhonchi. HEART:  Regular rate and rhythm. No murmur appreciated. ABDOMEN:  Soft, nontender.  Positive, normoactive bowel sounds. No organomegaly palpated. MSK:  No focal spinal tenderness to palpation. Full range of motion bilaterally in the upper extremities. EXTREMITIES:  No peripheral edema.   SKIN:  Clear with no obvious rashes or skin changes. No nail dyscrasia. NEURO:  Nonfocal. Well oriented.  Appropriate affect.      LAB RESULTS: No results found for: SPEP  Lab Results  Component Value Date   WBC 7.5 06/30/2019   NEUTROABS 4.2 06/30/2019   HGB 10.9 (L) 06/30/2019   HCT 33.8 (L) 06/30/2019   MCV 97.4 06/30/2019   PLT 310 06/30/2019      Chemistry      Component Value Date/Time   NA 138 06/30/2019 0737   NA 140 06/19/2017 1312   K 3.2 (L) 06/30/2019 0737   K 3.5 06/19/2017 1312   CL 102 06/30/2019 0737   CO2 22 06/30/2019 0737   CO2 25 06/19/2017 1312   BUN 11 06/30/2019 0737   BUN 18.4 06/19/2017 1312   CREATININE 0.79 06/30/2019 0737   CREATININE 0.79 06/09/2019 1326   CREATININE 0.8 06/19/2017 1312      Component Value Date/Time   CALCIUM 9.6 06/30/2019 0737    CALCIUM 10.2 06/19/2017 1312   ALKPHOS 72 06/30/2019 0737   ALKPHOS 86 06/19/2017 1312   AST 48 (H) 06/30/2019 0737   AST 69 (H) 06/09/2019 1326   AST 15 06/19/2017 1312   ALT 34 06/30/2019 0737   ALT 40 06/09/2019 1326   ALT 22 06/19/2017 1312   BILITOT 0.4 06/30/2019 0737   BILITOT 0.3 06/09/2019 1326   BILITOT 0.26 06/19/2017 1312       No results found for: LABCA2  No components found for: LABCA125  No results for input(s): INR in the last 168 hours.  Urinalysis    Component Value Date/Time   COLORURINE YELLOW 03/13/2009 2251   APPEARANCEUR CLOUDY (A) 03/13/2009 2251   LABSPEC 1.020 06/15/2014 1101   PHURINE 6.0 06/15/2014 1101   PHURINE 5.5 03/13/2009 2251   GLUCOSEU Negative 06/15/2014 1101   HGBUR Negative 06/15/2014 1101   HGBUR NEGATIVE 03/13/2009 2251   BILIRUBINUR Color Interference 06/15/2014 1101   KETONESUR Negative 06/15/2014 1101   KETONESUR NEGATIVE 03/13/2009 2251   PROTEINUR 30 06/15/2014 1101   PROTEINUR NEGATIVE 03/13/2009 2251   UROBILINOGEN 0.2 06/15/2014 1101   NITRITE Negative 06/15/2014 1101   NITRITE NEGATIVE 03/13/2009 2251   LEUKOCYTESUR Trace 06/15/2014 1101    STUDIES: No results found.  ASSESSMENT: 58 y.o. [place] woman status post left breast upper inner quadrant biopsy 02/08/2014 for a clinical T1c N0, stage IA invasive ductal carcinoma, grade 3, triple negative, with an MIB-1 of 50%.  (1) status post left lumpectomy and sentinel lymph node sampling 03/01/2014 for a pT1c pN0, stage IA invasive ductal carcinoma, grade 3, with negative margins, repeat prognostic panel again triple negative.  (2) adjuvant chemotherapy started a 03/25/2014 consisting of cyclophosphamide and doxorubicin given in dose dense fashion x4, completed 05/06/2014; followed by weekly paclitaxel x1, on  05/27/2014, poorly tolerated;   (a) switched to Abraxane starting 06/10/2014, with 11 Abraxane doses planned  (b) dose reduced by 20% because of neutropenia  starting 07/01/2014 dose  (c) discontinued after 4th Abraxane dose 07/15/2014 due to neuropathy  (3) adjuvant radiation completed 10/07/2014.  Left breast with breath hold technique/ 45 Gy at 1.8 Gy per fraction x 25 fractions.   Left breast boost/ 16 Gy at 2 Gy per fraction x 8 fractions  (4) genetics counseling 07/06/2015 through the Breast/Ovarian cancer gene panel offered by [facility] found no deleterious mutations in ATM, BARD1, BRCA1, BRCA2, BRIP1, CDH1, CHEK2, EPCAM, FANCC, MLH1, MSH2, MSH6, NBN, PALB2, PMS2, PTEN, RAD51C, RAD51D, TP53, and XRCC2.  (5) question of sleep apnea  (6) transverse colon lesion noted on CT scan from 06/17/2014 - negative biopsy, also showed fatty liver  (a) repeat CT scan of the abdomen and pelvis with contrast 02/15/2016 negative  RECURRENT DISEASE: February 2020 (7) biopsy of palpable mass upper inner left breast 11/10/2018 confirms invasive ductal carcinoma, grade 2 or 3, triple negative, with an MIB-1 of 80%.  (a) CT scans of the chest abdomen and pelvis with contrast showed no stage IV disease  (b) bone scan 11/13/2018 shows no definite metastasis to bone  (8) neoadjuvant chemotherapy consisting of carboplatin and gemcitabine given days 1 and 8 of each 21-day cycle, for 6 cycles, started 11/26/2018, completed 03/18/2019  (a) repeat ultrasound 01/25/2019 (after 3 cycles) shows no change in measurable breast mass   (9) bilateral mastectomies as follows   (a) left mastectomy and sentinel lymph node sampling with immediate tissue expander placement and latissimus flap 04/12/2019 showed a residual ypT1a ypN0 invasive ductal carcinoma, grade 3, with negative margins; a single sentinel lymph node was removed.  Margins were negative  (b) right mastectomy with immediate implant placement showed no malignancy  (c) right expander removed 06/02/2019 secondary to infectious complications  (10) molecular testing on the 11/10/2018 biopsy showed negative P D-L1,  negative androgen receptor, stable MSI and proficient mismatch repair status.  The tumor mutational burden was low.  No PIK3 mutation was detected.  There was a pathogenic TP 53 variant and negative ER PR and HER-2 were confirmed.  (11) Adjuvant Pembrolizumab given every 3 weeks beginning 06/09/2019  (12) Adjuvant Capecitabine '1500mg'$  PO BID for 14 days on and 7 days off on a 21 day cycle starting 06/30/2019.   PLAN: Isabelle is doing well today.  She has no signs of recurrence today.  Her CBC is stable and she continues on Pembrolizumab every 3 weeks with good tolerance.    She will start Capecitabine today.  She has the medication in hand and knows to take immediately after meals.  We reviewed diarrhea management, and skin peeling in the hands and feet.  She knows to call us with any issues that she might have with the medications and we can either give her advice over the phone, or bring her in for an office visit if needed.  [name] and I reviewed exercise.  I recommended that she slowly start walking again on her treadmill.  I recommended she start at 3 to 5 minutes at slow pace, and add one minute every 1-3 days.  She notes that she plans on doing this.    Kenneshia will return prior to her next pembrolizumab for [place] to check her labs, and see how she did with the capecitabine.  She was recommended to continue with the appropriate pandemic precautions. She knows to call for  any questions that may arise between now and her next appointment.  We are happy to see her sooner if needed.  A total of (30) minutes of face-to-face time was spent with this patient with greater than 50% of that time in counseling and care-coordination.  Wilber Bihari 06/30/19 9:12 AM Medical Oncology and Hematology [facility] [address] [place], [place] [address] Tel. [phone]    Fax. [phone]

## 2019-06-30 ENCOUNTER — Inpatient Hospital Stay (HOSPITAL_BASED_OUTPATIENT_CLINIC_OR_DEPARTMENT_OTHER): Payer: 59 | Admitting: Adult Health

## 2019-06-30 ENCOUNTER — Encounter: Payer: Self-pay | Admitting: Adult Health

## 2019-06-30 ENCOUNTER — Inpatient Hospital Stay: Payer: 59

## 2019-06-30 ENCOUNTER — Other Ambulatory Visit: Payer: Self-pay

## 2019-06-30 VITALS — BP 133/80 | HR 74 | Temp 98.2°F | Resp 18 | Ht 65.0 in | Wt 196.4 lb

## 2019-06-30 DIAGNOSIS — C50912 Malignant neoplasm of unspecified site of left female breast: Secondary | ICD-10-CM

## 2019-06-30 DIAGNOSIS — Z5112 Encounter for antineoplastic immunotherapy: Secondary | ICD-10-CM | POA: Diagnosis not present

## 2019-06-30 DIAGNOSIS — Z171 Estrogen receptor negative status [ER-]: Secondary | ICD-10-CM

## 2019-06-30 DIAGNOSIS — C50212 Malignant neoplasm of upper-inner quadrant of left female breast: Secondary | ICD-10-CM | POA: Diagnosis not present

## 2019-06-30 DIAGNOSIS — Z95828 Presence of other vascular implants and grafts: Secondary | ICD-10-CM

## 2019-06-30 LAB — COMPREHENSIVE METABOLIC PANEL WITH GFR
ALT: 34 U/L (ref 0–44)
AST: 48 U/L — ABNORMAL HIGH (ref 15–41)
Albumin: 3.9 g/dL (ref 3.5–5.0)
Alkaline Phosphatase: 72 U/L (ref 38–126)
Anion gap: 14 (ref 5–15)
BUN: 11 mg/dL (ref 6–20)
CO2: 22 mmol/L (ref 22–32)
Calcium: 9.6 mg/dL (ref 8.9–10.3)
Chloride: 102 mmol/L (ref 98–111)
Creatinine, Ser: 0.79 mg/dL (ref 0.44–1.00)
GFR calc Af Amer: >60 mL/min
GFR calc non Af Amer: >60 mL/min
Glucose, Bld: 136 mg/dL — ABNORMAL HIGH (ref 70–99)
Potassium: 3.2 mmol/L — ABNORMAL LOW (ref 3.5–5.1)
Sodium: 138 mmol/L (ref 135–145)
Total Bilirubin: 0.4 mg/dL (ref 0.3–1.2)
Total Protein: 7.6 g/dL (ref 6.5–8.1)

## 2019-06-30 LAB — CBC WITH DIFFERENTIAL/PLATELET
Abs Immature Granulocytes: 0.04 K/uL (ref 0.00–0.07)
Basophils Absolute: 0.1 K/uL (ref 0.0–0.1)
Basophils Relative: 1 %
Eosinophils Absolute: 0.4 K/uL (ref 0.0–0.5)
Eosinophils Relative: 6 %
HCT: 33.8 % — ABNORMAL LOW (ref 36.0–46.0)
Hemoglobin: 10.9 g/dL — ABNORMAL LOW (ref 12.0–15.0)
Immature Granulocytes: 1 %
Lymphocytes Relative: 27 %
Lymphs Abs: 2 K/uL (ref 0.7–4.0)
MCH: 31.4 pg (ref 26.0–34.0)
MCHC: 32.2 g/dL (ref 30.0–36.0)
MCV: 97.4 fL (ref 80.0–100.0)
Monocytes Absolute: 0.8 K/uL (ref 0.1–1.0)
Monocytes Relative: 10 %
Neutro Abs: 4.2 K/uL (ref 1.7–7.7)
Neutrophils Relative %: 55 %
Platelets: 310 K/uL (ref 150–400)
RBC: 3.47 MIL/uL — ABNORMAL LOW (ref 3.87–5.11)
RDW: 14.2 % (ref 11.5–15.5)
WBC: 7.5 K/uL (ref 4.0–10.5)
nRBC: 0 % (ref 0.0–0.2)

## 2019-06-30 LAB — TSH: TSH: 6.075 u[IU]/mL — ABNORMAL HIGH (ref 0.308–3.960)

## 2019-06-30 MED ORDER — SODIUM CHLORIDE 0.9 % IV SOLN
Freq: Once | INTRAVENOUS | Status: AC
  Administered 2019-06-30: 09:00:00 via INTRAVENOUS
  Filled 2019-06-30: qty 250

## 2019-06-30 MED ORDER — SODIUM CHLORIDE 0.9% FLUSH
10.0000 mL | Freq: Once | INTRAVENOUS | Status: AC
  Administered 2019-06-30: 08:00:00 10 mL
  Filled 2019-06-30: qty 10

## 2019-06-30 MED ORDER — SODIUM CHLORIDE 0.9% FLUSH
10.0000 mL | INTRAVENOUS | Status: DC | PRN
  Administered 2019-06-30: 10 mL
  Filled 2019-06-30: qty 10

## 2019-06-30 MED ORDER — HEPARIN SOD (PORK) LOCK FLUSH 100 UNIT/ML IV SOLN
500.0000 [IU] | Freq: Once | INTRAVENOUS | Status: AC | PRN
  Administered 2019-06-30: 11:00:00 500 [IU]
  Filled 2019-06-30: qty 5

## 2019-06-30 MED ORDER — SODIUM CHLORIDE 0.9 % IV SOLN
200.0000 mg | Freq: Once | INTRAVENOUS | Status: AC
  Administered 2019-06-30: 10:00:00 200 mg via INTRAVENOUS
  Filled 2019-06-30: qty 8

## 2019-06-30 NOTE — Patient Instructions (Signed)
[place] Discharge Instructions for Patients Receiving Chemotherapy  Today you received the following chemotherapy agents: Pembrolizumab   To help prevent nausea and vomiting after your treatment, we encourage you to take your nausea medication as directed.    If you develop nausea and vomiting that is not controlled by your nausea medication, call the clinic.   BELOW ARE SYMPTOMS THAT SHOULD BE REPORTED IMMEDIATELY:  *FEVER GREATER THAN 100.5 F  *CHILLS WITH OR WITHOUT FEVER  NAUSEA AND VOMITING THAT IS NOT CONTROLLED WITH YOUR NAUSEA MEDICATION  *UNUSUAL SHORTNESS OF BREATH  *UNUSUAL BRUISING OR BLEEDING  TENDERNESS IN MOUTH AND THROAT WITH OR WITHOUT PRESENCE OF ULCERS  *URINARY PROBLEMS  *BOWEL PROBLEMS  UNUSUAL RASH Items with * indicate a potential emergency and should be followed up as soon as possible.  Feel free to call the clinic should you have any questions or concerns. The clinic phone number is [phone].  Please show the [facility] at check-in to the Emergency Department and triage nurse.  Pembrolizumab injection What is this medicine? PEMBROLIZUMAB (pem broe liz ue mab) is a monoclonal antibody. It is used to treat bladder cancer, cervical cancer, endometrial cancer, esophageal cancer, head and neck cancer, hepatocellular cancer, Hodgkin lymphoma, kidney cancer, lymphoma, melanoma, Merkel cell carcinoma, lung cancer, stomach cancer, urothelial cancer, and cancers that have a certain genetic condition. This medicine may be used for other purposes; ask your health care provider or pharmacist if you have questions. COMMON BRAND NAME(S): Keytruda What should I tell my health care provider before I take this medicine? They need to know if you have any of these conditions:  diabetes  immune system problems  inflammatory bowel disease  liver disease  lung or breathing disease  lupus  received or scheduled to receive an organ  transplant or a stem-cell transplant that uses donor stem cells  an unusual or allergic reaction to pembrolizumab, other medicines, foods, dyes, or preservatives  pregnant or trying to get pregnant  breast-feeding How should I use this medicine? This medicine is for infusion into a vein. It is given by a health care professional in a hospital or clinic setting. A special MedGuide will be given to you before each treatment. Be sure to read this information carefully each time. Talk to your pediatrician regarding the use of this medicine in children. While this drug may be prescribed for selected conditions, precautions do apply. Overdosage: If you think you have taken too much of this medicine contact a poison control center or emergency room at once. NOTE: This medicine is only for you. Do not share this medicine with others. What if I miss a dose? It is important not to miss your dose. Call your doctor or health care professional if you are unable to keep an appointment. What may interact with this medicine? Interactions have not been studied. Give your health care provider a list of all the medicines, herbs, non-prescription drugs, or dietary supplements you use. Also tell them if you smoke, drink alcohol, or use illegal drugs. Some items may interact with your medicine. This list may not describe all possible interactions. Give your health care provider a list of all the medicines, herbs, non-prescription drugs, or dietary supplements you use. Also tell them if you smoke, drink alcohol, or use illegal drugs. Some items may interact with your medicine. What should I watch for while using this medicine? Your condition will be monitored carefully while you are receiving this medicine. You  may need blood work done while you are taking this medicine. Do not become pregnant while taking this medicine or for 4 months after stopping it. Women should inform their doctor if they wish to become  pregnant or think they might be pregnant. There is a potential for serious side effects to an unborn child. Talk to your health care professional or pharmacist for more information. Do not breast-feed an infant while taking this medicine or for 4 months after the last dose. What side effects may I notice from receiving this medicine? Side effects that you should report to your doctor or health care professional as soon as possible:  allergic reactions like skin rash, itching or hives, swelling of the face, lips, or tongue  bloody or black, tarry  breathing problems  changes in vision  chest pain  chills  confusion  constipation  cough  diarrhea  dizziness or feeling faint or lightheaded  fast or irregular heartbeat  fever  flushing  hair loss  joint pain  low blood counts - this medicine may decrease the number of white blood cells, red blood cells and platelets. You may be at increased risk for infections and bleeding.  muscle pain  muscle weakness  persistent headache  redness, blistering, peeling or loosening of the skin, including inside the mouth  signs and symptoms of high blood sugar such as dizziness; dry mouth; dry skin; fruity breath; nausea; stomach pain; increased hunger or thirst; increased urination  signs and symptoms of kidney injury like trouble passing urine or change in the amount of urine  signs and symptoms of liver injury like dark urine, light-colored stools, loss of appetite, nausea, right upper belly pain, yellowing of the eyes or skin  sweating  swollen lymph nodes  weight loss Side effects that usually do not require medical attention (report to your doctor or health care professional if they continue or are bothersome):  decreased appetite  muscle pain  tiredness This list may not describe all possible side effects. Call your doctor for medical advice about side effects. You may report side effects to FDA at  1-800-FDA-1088. Where should I keep my medicine? This drug is given in a hospital or clinic and will not be stored at home. NOTE: This sheet is a summary. It may not cover all possible information. If you have questions about this medicine, talk to your doctor, pharmacist, or health care provider.  2020 Elsevier/Gold Standard (2018-10-13 13:46:58)

## 2019-07-05 ENCOUNTER — Telehealth: Payer: Self-pay | Admitting: *Deleted

## 2019-07-05 NOTE — Telephone Encounter (Signed)
This RN spoke with pt per her call stating onset this past Saturday of " dizziness and fatigue and what can I do for it "  She started capecitibine on 06/30/2019.  She states she has history of " vertigo " but states " this is different ".  She states history of vertigo was with positional movement - which was relieved with use of claritin ( she states meclizine, and zytec non beneficial ) but stopped the claritin over 6 months ago due to " it started making my stomach cramp ".  She tried it again a little over a month ago with same outcome.  She states present dizziness " is more overwhelming, and occurs at rest "  She denies any nausea or vomiting.  She has good command of speech and recall per phone discussion.  She states she has general lack of appetite " but I make myself eat something because you have tot with the medication"  She does not have diarrhea presently.  The only other recent change is some itching but only " near and around the surgical site " - left axillary and breast area- she denies any rashes.  Per phone discussion pt will try Xyzal presently for possible benefit.  This note will be reviewed with MD for any further recommendations.

## 2019-07-21 ENCOUNTER — Encounter: Payer: Self-pay | Admitting: Adult Health

## 2019-07-21 ENCOUNTER — Inpatient Hospital Stay: Payer: 59

## 2019-07-21 ENCOUNTER — Inpatient Hospital Stay (HOSPITAL_BASED_OUTPATIENT_CLINIC_OR_DEPARTMENT_OTHER): Payer: 59 | Admitting: Adult Health

## 2019-07-21 ENCOUNTER — Inpatient Hospital Stay: Payer: 59 | Attending: Oncology

## 2019-07-21 ENCOUNTER — Other Ambulatory Visit: Payer: Self-pay

## 2019-07-21 VITALS — BP 126/75 | HR 76 | Temp 98.5°F | Resp 18 | Ht 65.0 in | Wt 197.1 lb

## 2019-07-21 DIAGNOSIS — I10 Essential (primary) hypertension: Secondary | ICD-10-CM | POA: Diagnosis not present

## 2019-07-21 DIAGNOSIS — Z9013 Acquired absence of bilateral breasts and nipples: Secondary | ICD-10-CM | POA: Diagnosis not present

## 2019-07-21 DIAGNOSIS — Z801 Family history of malignant neoplasm of trachea, bronchus and lung: Secondary | ICD-10-CM | POA: Insufficient documentation

## 2019-07-21 DIAGNOSIS — Z923 Personal history of irradiation: Secondary | ICD-10-CM | POA: Insufficient documentation

## 2019-07-21 DIAGNOSIS — C50212 Malignant neoplasm of upper-inner quadrant of left female breast: Secondary | ICD-10-CM | POA: Insufficient documentation

## 2019-07-21 DIAGNOSIS — Z171 Estrogen receptor negative status [ER-]: Secondary | ICD-10-CM | POA: Insufficient documentation

## 2019-07-21 DIAGNOSIS — Z9221 Personal history of antineoplastic chemotherapy: Secondary | ICD-10-CM | POA: Diagnosis not present

## 2019-07-21 DIAGNOSIS — Z8052 Family history of malignant neoplasm of bladder: Secondary | ICD-10-CM | POA: Diagnosis not present

## 2019-07-21 DIAGNOSIS — Z95828 Presence of other vascular implants and grafts: Secondary | ICD-10-CM

## 2019-07-21 DIAGNOSIS — Z8049 Family history of malignant neoplasm of other genital organs: Secondary | ICD-10-CM | POA: Insufficient documentation

## 2019-07-21 DIAGNOSIS — Z79899 Other long term (current) drug therapy: Secondary | ICD-10-CM | POA: Diagnosis not present

## 2019-07-21 DIAGNOSIS — R21 Rash and other nonspecific skin eruption: Secondary | ICD-10-CM | POA: Diagnosis not present

## 2019-07-21 DIAGNOSIS — Z87891 Personal history of nicotine dependence: Secondary | ICD-10-CM | POA: Diagnosis not present

## 2019-07-21 DIAGNOSIS — Z5112 Encounter for antineoplastic immunotherapy: Secondary | ICD-10-CM | POA: Diagnosis present

## 2019-07-21 DIAGNOSIS — E039 Hypothyroidism, unspecified: Secondary | ICD-10-CM | POA: Insufficient documentation

## 2019-07-21 DIAGNOSIS — E7800 Pure hypercholesterolemia, unspecified: Secondary | ICD-10-CM | POA: Diagnosis not present

## 2019-07-21 LAB — COMPREHENSIVE METABOLIC PANEL WITH GFR
ALT: 36 U/L (ref 0–44)
AST: 60 U/L — ABNORMAL HIGH (ref 15–41)
Albumin: 3.9 g/dL (ref 3.5–5.0)
Alkaline Phosphatase: 76 U/L (ref 38–126)
Anion gap: 15 (ref 5–15)
BUN: 11 mg/dL (ref 6–20)
CO2: 23 mmol/L (ref 22–32)
Calcium: 9.7 mg/dL (ref 8.9–10.3)
Chloride: 101 mmol/L (ref 98–111)
Creatinine, Ser: 0.82 mg/dL (ref 0.44–1.00)
GFR calc Af Amer: >60 mL/min (ref >60)
GFR calc non Af Amer: >60 mL/min (ref >60)
Glucose, Bld: 109 mg/dL — ABNORMAL HIGH (ref 70–99)
Potassium: 3.3 mmol/L — ABNORMAL LOW (ref 3.5–5.1)
Sodium: 139 mmol/L (ref 135–145)
Total Bilirubin: 0.4 mg/dL (ref 0.3–1.2)
Total Protein: 7.8 g/dL (ref 6.5–8.1)

## 2019-07-21 LAB — CBC WITH DIFFERENTIAL/PLATELET
Abs Immature Granulocytes: 0.04 K/uL (ref 0.00–0.07)
Basophils Absolute: 0.1 K/uL (ref 0.0–0.1)
Basophils Relative: 1 %
Eosinophils Absolute: 0.3 K/uL (ref 0.0–0.5)
Eosinophils Relative: 3 %
HCT: 35.1 % — ABNORMAL LOW (ref 36.0–46.0)
Hemoglobin: 11.5 g/dL — ABNORMAL LOW (ref 12.0–15.0)
Immature Granulocytes: 1 %
Lymphocytes Relative: 22 %
Lymphs Abs: 1.8 K/uL (ref 0.7–4.0)
MCH: 32.1 pg (ref 26.0–34.0)
MCHC: 32.8 g/dL (ref 30.0–36.0)
MCV: 98 fL (ref 80.0–100.0)
Monocytes Absolute: 0.8 K/uL (ref 0.1–1.0)
Monocytes Relative: 10 %
Neutro Abs: 5.1 K/uL (ref 1.7–7.7)
Neutrophils Relative %: 63 %
Platelets: 275 K/uL (ref 150–400)
RBC: 3.58 MIL/uL — ABNORMAL LOW (ref 3.87–5.11)
RDW: 16.2 % — ABNORMAL HIGH (ref 11.5–15.5)
WBC: 8 K/uL (ref 4.0–10.5)
nRBC: 0 % (ref 0.0–0.2)

## 2019-07-21 LAB — TSH: TSH: 6.449 u[IU]/mL — ABNORMAL HIGH (ref 0.308–3.960)

## 2019-07-21 MED ORDER — SODIUM CHLORIDE 0.9 % IV SOLN
200.0000 mg | Freq: Once | INTRAVENOUS | Status: AC
  Administered 2019-07-21: 15:00:00 200 mg via INTRAVENOUS
  Filled 2019-07-21: qty 8

## 2019-07-21 MED ORDER — SODIUM CHLORIDE 0.9% FLUSH
10.0000 mL | Freq: Once | INTRAVENOUS | Status: AC
  Administered 2019-07-21: 13:00:00 10 mL
  Filled 2019-07-21: qty 10

## 2019-07-21 MED ORDER — TRIAMCINOLONE ACETONIDE 0.5 % EX OINT: 1.0000 "application " | TOPICAL_OINTMENT | Freq: Two times a day (BID) | CUTANEOUS | 0 refills | Status: DC

## 2019-07-21 MED ORDER — HEPARIN SOD (PORK) LOCK FLUSH 100 UNIT/ML IV SOLN
500.0000 [IU] | Freq: Once | INTRAVENOUS | Status: AC | PRN
  Administered 2019-07-21: 500 [IU]
  Filled 2019-07-21: qty 5

## 2019-07-21 MED ORDER — SODIUM CHLORIDE 0.9% FLUSH
10.0000 mL | INTRAVENOUS | Status: DC | PRN
  Administered 2019-07-21: 10 mL
  Filled 2019-07-21: qty 10

## 2019-07-21 MED ORDER — SODIUM CHLORIDE 0.9 % IV SOLN
Freq: Once | INTRAVENOUS | Status: AC
  Administered 2019-07-21: 15:00:00 via INTRAVENOUS
  Filled 2019-07-21: qty 250

## 2019-07-21 NOTE — Patient Instructions (Signed)
Cancer Center Discharge Instructions for Patients Receiving Chemotherapy  Today you received the following chemotherapy agents: Pembrolizumab   To help prevent nausea and vomiting after your treatment, we encourage you to take your nausea medication as directed.    If you develop nausea and vomiting that is not controlled by your nausea medication, call the clinic.   BELOW ARE SYMPTOMS THAT SHOULD BE REPORTED IMMEDIATELY:  *FEVER GREATER THAN 100.5 F  *CHILLS WITH OR WITHOUT FEVER  NAUSEA AND VOMITING THAT IS NOT CONTROLLED WITH YOUR NAUSEA MEDICATION  *UNUSUAL SHORTNESS OF BREATH  *UNUSUAL BRUISING OR BLEEDING  TENDERNESS IN MOUTH AND THROAT WITH OR WITHOUT PRESENCE OF ULCERS  *URINARY PROBLEMS  *BOWEL PROBLEMS  UNUSUAL RASH Items with * indicate a potential emergency and should be followed up as soon as possible.  Feel free to call the clinic should you have any questions or concerns. The clinic phone number is [phone].  Please show the CHEMO ALERT CARD at check-in to the Emergency Department and triage nurse.   

## 2019-07-21 NOTE — Progress Notes (Signed)
[facility]  Telephone:(336) [phone] Fax:(336) [phone]    ID: Alexandria Bradley OB: 10-06-1960  MR#: [id number]  FXT#:[id number]  Patient Care Team: [name], DO as PCP - General (Family Medicine) Josue Hector, MD as PCP - Cardiology (Cardiology) Magrinat, Virgie Dad, MD as Consulting Physician (Oncology) Brien Few, MD as Consulting Physician (Obstetrics and Gynecology) [name], MD as Consulting Physician (General Surgery) Delrae Rend, MD as Consulting Physician (Endocrinology) Wilford Corner, MD as Consulting Physician (Gastroenterology) Dorna Leitz, MD as Consulting Physician (Orthopedic Surgery) Josue Hector, MD as Consulting Physician (Cardiology) Irene Limbo, MD as Consulting Physician (Plastic Surgery) OTHER MD:    CHIEF COMPLAINT: Triple negative breast cancer, locally recurrent (s/p bilateral mastectomies)  CURRENT TREATMENT: adjuvant capecitabine/pembrolizumab   INTERVAL HISTORY: Alexandria Bradley returns today for follow-up and treatment of her locally recurrent triple negative breast cancer.   She was started on Pembrolizumab and receives this every 3 weeks.  She tolerates this well.  3 weeks ago she also started Capecitabine given at 1564m BID two weeks on and one week off.  She has since noted a macular erythematous rash on her hands.  She noted that this morning when she restarted the Capecitabine, the rash has since spread to her arms and chest.  She notes that it does itch.     REVIEW OF SYSTEMS: CDelbrais feeling well otherwise.  She continues to exercise and has no concerns such as fever, chills, chest pain, cough, nausea, vomiting, bowel/bladder changes, headaches, shortness of breath, mucositis, vision issues.  A detailed ROS was otherwise non contributory.     BREAST CANCER HISTORY: From the original intake note of 02/16/2014:  CHoyle Sauerpalpated a mass in her left breast early May and brought it  immediately to her gynecologist attention. He set her up [facility] Mammography showed a 1.3 cm oval mass with spiculated margins in the left breast at the 11:00 position. This was palpable. Ultrasound showed a 1.1 cm tall her than wide mass with a microlobulated margins. He was hypoechoic. There were no abnormalities noted in the left axilla.  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 KLyndon Codeat conference 02/16/2014), triple negative, with an MIB-1 of 50%.  The patient's subsequent history is as detailed below   PAST MEDICAL HISTORY: Past Medical History:  Diagnosis Date  . Anxiety   . Arthritis   . Back pain   . Breast cancer ([place]    left  . Fatty liver   . GERD (gastroesophageal reflux disease)   . Headache(784.0)    PMH : migraines  . History of kidney stones   . Hypercholesterolemia   . Hypertension   . Hypothyroidism   . Kidney stones   . Osteoporosis   . Personal history of chemotherapy   . Personal history of radiation therapy   . PONV (postoperative nausea and vomiting)    difficult to wake up  . Pre-diabetes   . Psoriasis   . PVC's (premature ventricular contractions)   . Radiation 08/18/14-10/07/14   Left breast    PAST SURGICAL HISTORY: Past Surgical History:  Procedure Laterality Date  . BREAST BIOPSY Left 11/10/2018  . BREAST LUMPECTOMY Left 2015  . BREAST LUMPECTOMY WITH AXILLARY LYMPH NODE BIOPSY  6/15   left  . BREAST RECONSTRUCTION WITH PLACEMENT OF TISSUE EXPANDER AND ALLODERM Bilateral  04/12/2019   Procedure: BILATERAL BREAST RECONSTRUCTION WITH PLACEMENT OF TISSUE EXPANDERS; ALLODERM TO RIGHT CHEST;  Surgeon: Irene Limbo, MD;  Location: [facility];  Service: Plastics;  Laterality: Bilateral;  . CARDIAC CATHETERIZATION    . CHOLECYSTECTOMY    . COLONOSCOPY N/A  06/27/2014   Procedure: COLONOSCOPY;  Surgeon: Lear Ng, MD;  [facility];  Service: Endoscopy;  Laterality: N/A;  . LATISSIMUS FLAP TO BREAST Left 04/12/2019   Procedure: LEFT LATISSIMUS DORSI FLAP TO LEFT CHEST;  Surgeon: Irene Limbo, MD;  Location: [facility];  Service: Plastics;  Laterality: Left;  . LEFT HEART CATH AND CORONARY ANGIOGRAPHY N/A 07/24/2018   Procedure: LEFT HEART CATH AND CORONARY ANGIOGRAPHY;  Surgeon: Troy Sine, MD;  Location: [facility];  Service: Cardiovascular;  Laterality: N/A;  . LIPOMA EXCISION    . MASTECTOMY W/ SENTINEL NODE BIOPSY Bilateral 04/12/2019   Procedure: RIGHT BREAST RISK REDUCING MASTECTOMY; LEFT MASTECTOMY WITH LEFT AXILLARY SENTINEL LYMPH NODE BIOPSY WITH BLUE DYE INJECTION;  Surgeon: [name], MD;  Location: [place];  Service: General;  Laterality: Bilateral;  . MYOMECTOMY    . PORT-A-CATH REMOVAL N/A 02/03/2015   Procedure: REMOVAL PORT-A-CATH;  Surgeon: [name], MD;  Location: [facility];  Service: General;  Laterality: N/A;  . PORTACATH PLACEMENT N/A 03/01/2014   Procedure: INSERTION PORT-A-CATH;  Surgeon: [name], MD;  Location: [facility];  Service: General;  Laterality: N/A;  . PORTACATH PLACEMENT N/A 11/18/2018   Procedure: INSERTION PORT-A-CATH WITH ULTRASOUND;  Surgeon: [name], MD;  Location: [facility];  Service: General;  Laterality: N/A;  . REMOVAL OF TISSUE EXPANDER AND PLACEMENT OF IMPLANT Right 06/02/2019   Procedure: REMOVAL OF TISSUE EXPANDER RIGHT CHEST;  Surgeon: Irene Limbo, MD;  Location: [facility];  Service: Plastics;  Laterality: Right;  . TONSILLECTOMY    . TUBAL LIGATION    . WOUND DEBRIDEMENT Left 06/02/2019   Procedure: LEFT CHEST DEBRIDEMENT;  Surgeon: Irene Limbo, MD;  Location: [facility];  Service: Plastics;  Laterality: Left;    FAMILY HISTORY: Family History  Problem Relation Age of  Onset  . Endometrial cancer Mother 74  . Hearing loss Mother   . Atrial fibrillation Mother   . Lung cancer Father   . Brain cancer Father   . Heart disease Maternal Aunt   . Atrial fibrillation Maternal Aunt   . Lung cancer Maternal Uncle   . Atrial fibrillation Maternal Uncle   . Stroke Maternal Grandmother   . Stroke Maternal Grandfather   . Bladder Cancer Maternal Uncle   . Multiple myeloma Maternal Uncle   . Other Son        trisomy 64   The patient's father died at the age of 17 from what may have been metastatic lung cancer. The patient knows very little about the father's side of her family. Her mother is alive at age 74. She was recently diagnosed with endometrial cancer. The patient has one brother, 2 sisters. There is no history of breast or ovarian cancer in the family.   GYNECOLOGIC HISTORY:  Menarche age 49, first live birth age 78. The patient is GX P3. One child died shortly after birth. She stopped having periods approximately 2005. She did not use hormone replacement. 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 Son Alexandria Bradley is  Nurse, adult in [place]. Daughter Alexandria Bradley lives in [place] and works for [employer] as an Therapist, sports. She has three grandchildren.    ADVANCED DIRECTIVES: Not in place   HEALTH MAINTENANCE: Social History   Tobacco Use  . Smoking status: Former Smoker    Packs/day: 0.50    Years: 15.00    Pack years: 7.50    Types: Cigarettes  . Smokeless tobacco: Never Used  . Tobacco comment: Quit smoking cigarettes in 2002  Substance Use Topics  . Alcohol use: Yes    Comment: social  . Drug use: No     Colonoscopy: 06/27/2014, normal, [name]  PAP: 2013  Bone  density: 04/10/2015, -1.1 ([facility])  Lipid panel:  Allergies  Allergen Reactions  . Chlorhexidine Itching  . Ciprofloxacin Anxiety, Rash and Other (See Comments)    GI upset  . Prednisone Shortness Of Breath and Other (See Comments)    "jacks me up" jittery   . Codeine Nausea Only  . Cortisone Swelling and Other (See Comments)    "jack me up"     Current Outpatient Medications  Medication Sig Dispense Refill  . calcium carbonate (TUMS - DOSED IN MG ELEMENTAL CALCIUM) 500 MG chewable tablet Chew 1 tablet by mouth at bedtime as needed for indigestion or heartburn.    . capecitabine (XELODA) 500 MG tablet Take 3 tablets (1500 mg) by mouth twice daily, immediately after meals. Take for 14 days on, 7 days off, repeat every 21 days 84 tablet 7  . diltiazem (CARDIZEM CD) 240 MG 24 hr capsule Take 1 capsule (240 mg total) by mouth daily. 90 capsule 3  . Levothyroxine Sodium (TIROSINT) 112 MCG CAPS Take 1 capsule (112 mcg total) by mouth daily before breakfast. 30 capsule   . liothyronine (CYTOMEL) 5 MCG tablet Take 5 mcg by mouth daily before breakfast.     . methocarbamol (ROBAXIN) 500 MG tablet Take 1 tablet (500 mg total) by mouth every 8 (eight) hours as needed for muscle spasms. 30 tablet 0  . omeprazole (PRILOSEC) 20 MG capsule Take 1 capsule (20 mg total) by mouth 2 (two) times daily before a meal. (Patient taking differently: Take 20 mg by mouth 2 (two) times daily as needed (for heartburn or acid reflux). ) 60 capsule 6  . ondansetron (ZOFRAN) 8 MG tablet Take 1 tablet (8 mg total) by mouth every 8 (eight) hours as needed for nausea or vomiting. 20 tablet 0  . oxyCODONE (OXY IR/ROXICODONE) 5 MG immediate release tablet Take 1-2 tablets (5-10 mg total) by mouth every 4 (four) hours as needed for moderate pain. 40 tablet 0  . potassium chloride (K-DUR) 10 MEQ tablet TAKE 1 TABLET (10 MEQ TOTAL) BY MOUTH 2 (TWO) TIMES DAILY. 80 tablet 2  . Prenatal Vit-Fe Fumarate-FA (PRENATAL PO) Take 1  tablet by mouth daily.    . propranolol (INDERAL) 10 MG tablet Take 10 mg by mouth daily as needed (for heart palpitations).     . triamterene-hydrochlorothiazide (MAXZIDE-25) 37.5-25 MG tablet Take 1 tablet by mouth daily. 90 tablet 3   No current facility-administered medications for this visit.     OBJECTIVE:   Vitals:   07/21/19 1326  BP: 126/75  Pulse: 76  Resp: 18  Temp: 98.5 F (36.9 C)  SpO2: 98%   Wt Readings from Last 3 Encounters:  07/21/19 197 lb 1.6 oz (  89.4 kg)  06/30/19 196 lb 6.4 oz (89.1 kg)  06/09/19 193 lb 3.2 oz (87.6 kg)   Body mass index is 32.8 kg/m.    ECOG FS:1 - Symptomatic but completely ambulatory GENERAL: Patient is a well appearing female in no acute distress HEENT:  Sclerae anicteric.  Mask in place.  Neck is supple.  NODES:  No cervical, supraclavicular, or axillary lymphadenopathy palpated.  BREAST EXAM:  Deferred. LUNGS:  Clear to auscultation bilaterally.  No wheezes or rhonchi. HEART:  Regular rate and rhythm. No murmur appreciated. ABDOMEN:  Soft, nontender.  Positive, normoactive bowel sounds. No organomegaly palpated. MSK:  No focal spinal tenderness to palpation. Full range of motion bilaterally in the upper extremities. EXTREMITIES:  No peripheral edema.   SKIN: macular rash on hands arms, and neck NEURO:  Nonfocal. Well oriented.  Appropriate affect.      LAB RESULTS: No results found for: SPEP  Lab Results  Component Value Date   WBC 8.0 07/21/2019   NEUTROABS 5.1 07/21/2019   HGB 11.5 (L) 07/21/2019   HCT 35.1 (L) 07/21/2019   MCV 98.0 07/21/2019   PLT 275 07/21/2019      Chemistry      Component Value Date/Time   NA 138 06/30/2019 0737   NA 140 06/19/2017 1312   K 3.2 (L) 06/30/2019 0737   K 3.5 06/19/2017 1312   CL 102 06/30/2019 0737   CO2 22 06/30/2019 0737   CO2 25 06/19/2017 1312   BUN 11 06/30/2019 0737   BUN 18.4 06/19/2017 1312   CREATININE 0.79 06/30/2019 0737   CREATININE 0.79 06/09/2019 1326    CREATININE 0.8 06/19/2017 1312      Component Value Date/Time   CALCIUM 9.6 06/30/2019 0737   CALCIUM 10.2 06/19/2017 1312   ALKPHOS 72 06/30/2019 0737   ALKPHOS 86 06/19/2017 1312   AST 48 (H) 06/30/2019 0737   AST 69 (H) 06/09/2019 1326   AST 15 06/19/2017 1312   ALT 34 06/30/2019 0737   ALT 40 06/09/2019 1326   ALT 22 06/19/2017 1312   BILITOT 0.4 06/30/2019 0737   BILITOT 0.3 06/09/2019 1326   BILITOT 0.26 06/19/2017 1312       No results found for: LABCA2  No components found for: LABCA125  No results for input(s): INR in the last 168 hours.  Urinalysis    Component Value Date/Time   COLORURINE YELLOW 03/13/2009 2251   APPEARANCEUR CLOUDY (A) 03/13/2009 2251   LABSPEC 1.020 06/15/2014 1101   PHURINE 6.0 06/15/2014 1101   PHURINE 5.5 03/13/2009 2251   GLUCOSEU Negative 06/15/2014 1101   HGBUR Negative 06/15/2014 1101   HGBUR NEGATIVE 03/13/2009 2251   BILIRUBINUR Color Interference 06/15/2014 1101   KETONESUR Negative 06/15/2014 1101   KETONESUR NEGATIVE 03/13/2009 2251   PROTEINUR 30 06/15/2014 1101   PROTEINUR NEGATIVE 03/13/2009 2251   UROBILINOGEN 0.2 06/15/2014 1101   NITRITE Negative 06/15/2014 1101   NITRITE NEGATIVE 03/13/2009 2251   LEUKOCYTESUR Trace 06/15/2014 1101    STUDIES: No results found.  ASSESSMENT: 58 y.o. [place] woman status post left breast upper inner quadrant biopsy 02/08/2014 for a clinical T1c N0, stage IA invasive ductal carcinoma, grade 3, triple negative, with an MIB-1 of 50%.  (1) status post left lumpectomy and sentinel lymph node sampling 03/01/2014 for a pT1c pN0, stage IA invasive ductal carcinoma, grade 3, with negative margins, repeat prognostic panel again triple negative.  (2) adjuvant chemotherapy started a 03/25/2014 consisting of cyclophosphamide and doxorubicin given in  dose dense fashion x4, completed 05/06/2014; followed by weekly paclitaxel x1, on 05/27/2014, poorly tolerated;   (a) switched to Abraxane  starting 06/10/2014, with 11 Abraxane doses planned  (b) dose reduced by 20% because of neutropenia starting 07/01/2014 dose  (c) discontinued after 4th Abraxane dose 07/15/2014 due to neuropathy  (3) adjuvant radiation completed 10/07/2014.  Left breast with breath hold technique/ 45 Gy at 1.8 Gy per fraction x 25 fractions.   Left breast boost/ 16 Gy at 2 Gy per fraction x 8 fractions  (4) genetics counseling 07/06/2015 through the Breast/Ovarian cancer gene panel offered by [facility] found no deleterious mutations in ATM, BARD1, BRCA1, BRCA2, BRIP1, CDH1, CHEK2, EPCAM, FANCC, MLH1, MSH2, MSH6, NBN, PALB2, PMS2, PTEN, RAD51C, RAD51D, TP53, and XRCC2.  (5) question of sleep apnea  (6) transverse colon lesion noted on CT scan from 06/17/2014 - negative biopsy, also showed fatty liver  (a) repeat CT scan of the abdomen and pelvis with contrast 02/15/2016 negative  RECURRENT DISEASE: February 2020 (7) biopsy of palpable mass upper inner left breast 11/10/2018 confirms invasive ductal carcinoma, grade 2 or 3, triple negative, with an MIB-1 of 80%.  (a) CT scans of the chest abdomen and pelvis with contrast showed no stage IV disease  (b) bone scan 11/13/2018 shows no definite metastasis to bone  (8) neoadjuvant chemotherapy consisting of carboplatin and gemcitabine given days 1 and 8 of each 21-day cycle, for 6 cycles, started 11/26/2018, completed 03/18/2019  (a) repeat ultrasound 01/25/2019 (after 3 cycles) shows no change in measurable breast mass   (9) bilateral mastectomies as follows   (a) left mastectomy and sentinel lymph node sampling with immediate tissue expander placement and latissimus flap 04/12/2019 showed a residual ypT1a ypN0 invasive ductal carcinoma, grade 3, with negative margins; a single sentinel lymph node was removed.  Margins were negative  (b) right mastectomy with immediate implant placement showed no malignancy  (c) right expander removed 06/02/2019 secondary to  infectious complications  (10) molecular testing on the 11/10/2018 biopsy showed negative P D-L1, negative androgen receptor, stable MSI and proficient mismatch repair status.  The tumor mutational burden was low.  No PIK3 mutation was detected.  There was a pathogenic TP 53 variant and negative ER PR and HER-2 were confirmed.  (11) Adjuvant Pembrolizumab given every 3 weeks beginning 06/09/2019  (12) Adjuvant Capecitabine 1529m PO BID for 14 days on and 7 days off on a 21 day cycle starting 06/30/2019.   PLAN: CTrishiais doing well today.  Her labs are stable and she will continue on the Pembrolizumab today.    I am concerned about her rash.  We will hold the Capecitabine for this cycle and will see if her rash improves.  She understands this.  We reviewed healthy diet and exercise today.    We will see CRashellback in 3 weeks for labs, f/u and her next treatment. She was recommended to continue with the appropriate pandemic precautions. She knows to call for any questions that may arise between now and her next appointment.  We are happy to see her sooner if needed.  I reviewed the above plan with Dr. MJana Hakimwho is in agreement.  A total of (20) minutes of face-to-face time was spent with this patient with greater than 50% of that time in counseling and care-coordination.  LWilber Bihari10/21/20 1:45 PM Medical Oncology and Hematology [facility] [address]Tel. [phone]   Fax. [phone]

## 2019-08-10 NOTE — Progress Notes (Signed)
[place]  [phone] Fax:(336) [phone]    ID: Alexandria Bradley OB: Dec 01, 1960  MR#: [id number]  NLZ#:[id number]  Patient Care Team: [name], DO as PCP - General (Family Medicine) Josue Hector, MD as PCP - Cardiology (Cardiology) , Virgie Dad, MD as Consulting Physician (Oncology) Brien Few, MD as Consulting Physician (Obstetrics and Gynecology) [name], MD as Consulting Physician (General Surgery) Delrae Rend, MD as Consulting Physician (Endocrinology) Wilford Corner, MD as Consulting Physician (Gastroenterology) Dorna Leitz, MD as Consulting Physician (Orthopedic Surgery) Josue Hector, MD as Consulting Physician (Cardiology) Irene Limbo, MD as Consulting Physician (Plastic Surgery) OTHER MD:    CHIEF COMPLAINT: Triple negative breast cancer, locally recurrent (s/p bilateral mastectomies)  CURRENT TREATMENT: adjuvant capecitabine and pembrolizumab   INTERVAL HISTORY: Alexandria Bradley returns today for follow-up and treatment of her locally recurrent triple negative breast cancer.   She continues on Pembrolizumab.  She has had no unusual side effects from this.  She has however developed a rash.  Because of it we held the capecitabine last cycle.  That does not seem to have made any difference.  The rash is primarily on her arms but also on her upper back and particularly on her scalp.  She does have a history of psoriasis and some of it is exacerbation of the psoriasis.  She is using a steroid cream with little effect.  She also receives capecitabine given at 1528m BID two weeks on and one week off.  As stated above this has been held for the last cycle.  She has not had any diarrhea, mouth sores, or palmar plantar erythrodysesthesia from this medication  While on pembrolizumab we are following her TSH Results for Alexandria Bradley, Alexandria Bradley(MRN [id number] as of 08/11/2019 17:02  Ref. Range 05/11/2019 15:22  06/09/2019 13:26 06/30/2019 07:37 07/21/2019 13:02 08/11/2019 12:48  TSH Latest Ref Range: 0.308 - 3.960 uIU/mL 4.611 (H) 5.293 (H) 6.075 (H) 6.449 (H) 4.403 (H)    REVIEW OF SYSTEMS: Alexandria Bradley very itchy from her rash.  She has had no fevers, unusual headaches, visual changes, nausea, vomiting or change in bowel or bladder habits.  She denies any cough phlegm production or pleurisy.  A detailed review of systems today was otherwise stable   BREAST CANCER HISTORY: From the original intake note of 02/16/2014:  CHoyle Sauerpalpated a mass in her left breast early May and brought it immediately to her gynecologist attention. He [facility] Mammography showed a 1.3 cm oval mass with spiculated margins in the left breast at the 11:00 position. This was palpable. Ultrasound showed a 1.1 cm tall her than wide mass with a microlobulated margins. He was hypoechoic. There were no abnormalities noted in the left axilla.  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 KLyndon Codeat conference 02/16/2014), triple negative, with an MIB-1 of 50%.  The patient's subsequent history is as detailed below   PAST MEDICAL HISTORY: Past Medical History:  Diagnosis Date  . Anxiety   . Arthritis   . Back pain   . Breast cancer ([place]    left  . Fatty liver   . GERD (gastroesophageal reflux disease)   . Headache(784.0)    PMH : migraines  . History of kidney stones   . Hypercholesterolemia   . Hypertension   . Hypothyroidism   .  Kidney stones   . Osteoporosis   . Personal history of chemotherapy   . Personal history of radiation therapy   . PONV (postoperative nausea and vomiting)    difficult to wake up  . Pre-diabetes   . Psoriasis   . PVC's (premature ventricular contractions)   . Radiation 08/18/14-10/07/14   Left  breast    PAST SURGICAL HISTORY: Past Surgical History:  Procedure Laterality Date  . BREAST BIOPSY Left 11/10/2018  . BREAST LUMPECTOMY Left 2015  . BREAST LUMPECTOMY WITH AXILLARY LYMPH NODE BIOPSY  6/15   left  . BREAST RECONSTRUCTION WITH PLACEMENT OF TISSUE EXPANDER AND ALLODERM Bilateral 04/12/2019   Procedure: BILATERAL BREAST RECONSTRUCTION WITH PLACEMENT OF TISSUE EXPANDERS; ALLODERM TO RIGHT CHEST;  Surgeon: Irene Limbo, MD;  Location: [place];  Service: Plastics;  Laterality: Bilateral;  . CARDIAC CATHETERIZATION    . CHOLECYSTECTOMY    . COLONOSCOPY N/A 06/27/2014   Procedure: COLONOSCOPY;  Surgeon: Lear Ng, MD;  Location: [facility] ENDOSCOPY;  Service: Endoscopy;  Laterality: N/A;  . LATISSIMUS FLAP TO BREAST Left 04/12/2019   Procedure: LEFT LATISSIMUS DORSI FLAP TO LEFT CHEST;  Surgeon: Irene Limbo, MD;  Location: [place];  Service: Plastics;  Laterality: Left;  . LEFT HEART CATH AND CORONARY ANGIOGRAPHY N/A 07/24/2018   Procedure: LEFT HEART CATH AND CORONARY ANGIOGRAPHY;  Surgeon: Troy Sine, MD;  Location: [place] CV LAB;  Service: Cardiovascular;  Laterality: N/A;  . LIPOMA EXCISION    . MASTECTOMY W/ SENTINEL NODE BIOPSY Bilateral 04/12/2019   Procedure: RIGHT BREAST RISK REDUCING MASTECTOMY; LEFT MASTECTOMY WITH LEFT AXILLARY SENTINEL LYMPH NODE BIOPSY WITH BLUE DYE INJECTION;  Surgeon: [name], MD;  Location: [place];  Service: General;  Laterality: Bilateral;  . MYOMECTOMY    . PORT-A-CATH REMOVAL N/A 02/03/2015   Procedure: REMOVAL PORT-A-CATH;  Surgeon: [name], MD;  Location: [place];  Service: General;  Laterality: N/A;  . PORTACATH PLACEMENT N/A 03/01/2014   Procedure: INSERTION PORT-A-CATH;  Surgeon: [name], MD;  Location: [place];  Service: General;  Laterality: N/A;  . PORTACATH PLACEMENT N/A 11/18/2018   Procedure: INSERTION PORT-A-CATH WITH ULTRASOUND;  Surgeon: [name], MD;  Location: [place];  Service: General;  Laterality: N/A;  . REMOVAL OF TISSUE EXPANDER AND PLACEMENT OF IMPLANT Right 06/02/2019   Procedure: REMOVAL OF TISSUE EXPANDER RIGHT CHEST;  Surgeon: Irene Limbo, MD;  Location: [place];  Service: Plastics;  Laterality: Right;  . TONSILLECTOMY    . TUBAL LIGATION    . WOUND DEBRIDEMENT Left 06/02/2019   Procedure: LEFT CHEST DEBRIDEMENT;  Surgeon: Irene Limbo, MD;  Location: [place];  Service: Plastics;  Laterality: Left;    FAMILY HISTORY: Family History  Problem Relation Age of Onset  . Endometrial cancer Mother 84  . Hearing loss Mother   . Atrial fibrillation Mother   . Lung cancer Father   . Brain cancer Father   . Heart disease Maternal Aunt   . Atrial fibrillation Maternal Aunt   . Lung cancer Maternal Uncle   . Atrial fibrillation Maternal Uncle   . Stroke Maternal Grandmother   . Stroke Maternal Grandfather   . Bladder Cancer Maternal Uncle   . Multiple myeloma Maternal Uncle   . Other Son        trisomy 64   The patient's father died at the age of 58 from what may have been metastatic lung cancer. The patient knows very  little about the father's side of her family. Her mother is alive at age 43. She was recently diagnosed with endometrial cancer. The patient has one brother, 2 sisters. There is no history of breast or ovarian cancer in the family.   GYNECOLOGIC HISTORY:  Menarche age 78, first live birth age 86. The patient is GX P3. One child died shortly after birth. She stopped having periods approximately 2005. She did not use hormone replacement. 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 Son [name] is   Nurse, adult in [place]. Daughter [name] lives in [place] and works for [employer] as an Therapist, sports.  The patient has three grandchildren.    ADVANCED DIRECTIVES: Not in place   HEALTH MAINTENANCE: Social History   Tobacco Use  . Smoking status: Former Smoker    Packs/day: 0.50    Years: 15.00    Pack years: 7.50    Types: Cigarettes  . Smokeless tobacco: Never Used  . Tobacco comment: Quit smoking cigarettes in 2002  Substance Use Topics  . Alcohol use: Yes    Comment: social  . Drug use: No     Colonoscopy: 06/27/2014, normal, [name]  PAP: 2013  Bone density: 04/10/2015, -1.1 ([facility])  Lipid panel:  Allergies  Allergen Reactions  . Chlorhexidine Itching  . Ciprofloxacin Anxiety, Rash and Other (See Comments)    GI upset  . Prednisone Shortness Of Breath and Other (See Comments)    "jacks me up" jittery   . Codeine Nausea Only  . Cortisone Swelling and Other (See Comments)    "jack me up"     Current Outpatient Medications  Medication Sig Dispense Refill  . calcium carbonate (TUMS - DOSED IN MG ELEMENTAL CALCIUM) 500 MG chewable tablet Chew 1 tablet by mouth at bedtime as needed for indigestion or heartburn.    . capecitabine (XELODA) 500 MG tablet Take 3 tablets (1500 mg) by mouth twice daily, immediately after meals. Take for 14 days on, 7 days off, repeat every 21 days 84 tablet 7  . diltiazem (CARDIZEM CD) 240 MG 24 hr capsule Take 1 capsule (240 mg total) by mouth daily. 90 capsule 3  . Levothyroxine Sodium (TIROSINT) 112 MCG CAPS Take 1 capsule (112 mcg total) by mouth daily before breakfast. 30 capsule   . liothyronine (CYTOMEL) 5 MCG tablet Take 5 mcg by mouth daily before breakfast.     . omeprazole (PRILOSEC) 20 MG capsule Take 1 capsule (20 mg total) by mouth 2 (two) times daily before a meal. (Patient taking differently: Take 20 mg by mouth 2 (two) times daily as needed (for heartburn or acid reflux). ) 60 capsule 6  . ondansetron  (ZOFRAN) 8 MG tablet Take 1 tablet (8 mg total) by mouth every 8 (eight) hours as needed for nausea or vomiting. 20 tablet 0  . potassium chloride (K-DUR) 10 MEQ tablet TAKE 1 TABLET (10 MEQ TOTAL) BY MOUTH 2 (TWO) TIMES DAILY. 80 tablet 2  . propranolol (INDERAL) 10 MG tablet Take 10 mg by mouth daily as needed (for heart palpitations).     . triamcinolone ointment (KENALOG) 0.5 % Apply 1 application topically 2 (two) times daily. 30 g 0  . triamterene-hydrochlorothiazide (MAXZIDE-25) 37.5-25 MG tablet Take 1 tablet by  mouth daily. 90 tablet 3   No current facility-administered medications for this visit.    Facility-Administered Medications Ordered in Other Visits  Medication Dose Route Frequency Provider Last Rate Last Dose  . sodium chloride flush (NS) 0.9 % injection 10 mL  10 mL Intracatheter PRN , Virgie Dad, MD   10 mL at 08/11/19 1543    OBJECTIVE: Middle-aged white woman in no acute distress  Vitals:   08/11/19 1315  BP: (!) 141/69  Pulse: 77  Resp: 18  Temp: 98.3 F (36.8 C)   Wt Readings from Last 3 Encounters:  08/11/19 196 lb 11.2 oz (89.2 kg)  07/21/19 197 lb 1.6 oz (89.4 kg)  06/30/19 196 lb 6.4 oz (89.1 kg)   Body mass index is 32.73 kg/m.    ECOG FS:1 - Symptomatic but completely ambulatory  Sclerae unicteric, EOMs intact Wearing a mask No cervical or supraclavicular adenopathy Lungs no rales or rhonchi Heart regular rate and rhythm Abd soft, nontender, positive bowel sounds MSK no focal spinal tenderness, no upper extremity lymphedema Neuro: nonfocal, well oriented, appropriate affect Breasts: Status post bilateral mastectomies.  There is no evidence of chest wall recurrence.  Both axillae are benign   LAB RESULTS: No results found for: SPEP  Lab Results  Component Value Date   WBC 6.6 08/11/2019   NEUTROABS 4.0 08/11/2019   HGB 11.9 (L) 08/11/2019   HCT 36.6 08/11/2019   MCV 100.8 (H) 08/11/2019   PLT 282 08/11/2019      Chemistry       Component Value Date/Time   NA 139 08/11/2019 1248   NA 140 06/19/2017 1312   K 3.3 (L) 08/11/2019 1248   K 3.5 06/19/2017 1312   CL 104 08/11/2019 1248   CO2 23 08/11/2019 1248   CO2 25 06/19/2017 1312   BUN 12 08/11/2019 1248   BUN 18.4 06/19/2017 1312   CREATININE 0.81 08/11/2019 1248   CREATININE 0.79 06/09/2019 1326   CREATININE 0.8 06/19/2017 1312      Component Value Date/Time   CALCIUM 9.6 08/11/2019 1248   CALCIUM 10.2 06/19/2017 1312   ALKPHOS 73 08/11/2019 1248   ALKPHOS 86 06/19/2017 1312   AST 55 (H) 08/11/2019 1248   AST 69 (H) 06/09/2019 1326   AST 15 06/19/2017 1312   ALT 38 08/11/2019 1248   ALT 40 06/09/2019 1326   ALT 22 06/19/2017 1312   BILITOT 0.4 08/11/2019 1248   BILITOT 0.3 06/09/2019 1326   BILITOT 0.26 06/19/2017 1312       No results found for: LABCA2  No components found for: LABCA125  No results for input(s): INR in the last 168 hours.  Urinalysis    Component Value Date/Time   COLORURINE YELLOW 03/13/2009 2251   APPEARANCEUR CLOUDY (A) 03/13/2009 2251   LABSPEC 1.020 06/15/2014 1101   PHURINE 6.0 06/15/2014 1101   PHURINE 5.5 03/13/2009 2251   GLUCOSEU Negative 06/15/2014 1101   HGBUR Negative 06/15/2014 1101   HGBUR NEGATIVE 03/13/2009 2251   BILIRUBINUR Color Interference 06/15/2014 1101   KETONESUR Negative 06/15/2014 1101   KETONESUR NEGATIVE 03/13/2009 2251   PROTEINUR 30 06/15/2014 1101   PROTEINUR NEGATIVE 03/13/2009 2251   UROBILINOGEN 0.2 06/15/2014 1101   NITRITE Negative 06/15/2014 1101   NITRITE NEGATIVE 03/13/2009 2251   LEUKOCYTESUR Trace 06/15/2014 1101    STUDIES: No results found.  ASSESSMENT: 58 y.o. [place] woman status post left breast upper inner quadrant biopsy 02/08/2014 for a clinical T1c N0, stage IA invasive  ductal carcinoma, grade 3, triple negative, with an MIB-1 of 50%.  (1) status post left lumpectomy and sentinel lymph node sampling 03/01/2014 for a pT1c pN0, stage IA invasive ductal  carcinoma, grade 3, with negative margins, repeat prognostic panel again triple negative.  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  Left breast with breath hold technique/ 45 Gy at 1.8 Gy per fraction x 25 fractions.   Left breast boost/ 16 Gy at 2 Gy per fraction x 8 fractions  (4) genetics counseling 07/06/2015 through the Breast/Ovarian cancer gene panel offered by [facility] found no deleterious mutations in ATM, BARD1, BRCA1, BRCA2, BRIP1, CDH1, CHEK2, EPCAM, FANCC, MLH1, MSH2, MSH6, NBN, PALB2, PMS2, PTEN, RAD51C, RAD51D, TP53, and XRCC2.  (5) question of sleep apnea  (6) transverse colon lesion noted on CT scan from 06/17/2014 - negative biopsy, also showed fatty liver  (a) repeat CT scan of the abdomen and pelvis with contrast 02/15/2016 negative  RECURRENT DISEASE: February 2020 (7) biopsy of palpable mass upper inner left breast 11/10/2018 confirms invasive ductal carcinoma, grade 2 or 3, triple negative, with an MIB-1 of 80%.  (a) CT scans of the chest abdomen and pelvis with contrast showed no stage IV disease  (b) bone scan 11/13/2018 shows no definite metastasis to bone  (8) neoadjuvant chemotherapy consisting of carboplatin and gemcitabine given days 1 and 8 of each 21-day cycle, for 6 cycles, started 11/26/2018, completed 03/18/2019  (a) repeat ultrasound 01/25/2019 (after 3 cycles) shows no change in measurable breast mass   (9) bilateral mastectomies as follows   (a) left mastectomy and sentinel lymph node sampling with immediate tissue  expander placement and latissimus flap 04/12/2019 showed a residual ypT1a ypN0 invasive ductal carcinoma, grade 3, with negative margins; a single sentinel lymph node was removed.  Margins were negative  (b) right mastectomy with immediate implant placement showed no malignancy  (c) right expander removed 06/02/2019 secondary to infectious complications  (10) molecular testing on the 11/10/2018 biopsy showed negative P D-L1, negative androgen receptor, stable MSI and proficient mismatch repair status.  The tumor mutational burden was low.  No PIK3 mutation was detected.  There was a pathogenic TP53 variant and negative ER PR and HER-2 were confirmed.  (11) Adjuvant pembrolizumab given every 3 weeks beginning 06/09/2019  (12) Adjuvant Capecitabine 1518m PO BID for 14 days on and 7 days off on a 21 day cycle starting 06/30/2019.   PLAN: CConsandrais understandably bothered by the rash.  This is not going to be due to the capecitabine, which we held, with no improvement on the rash.  It is much more likely to be due to to the pembrolizumab: Rashes one of the most common side effects from this medication.  In addition she has a history of psoriasis and it does make sense that pembrolizumab might aggravate that disease.  Accordingly I suggested we discontinue the pembrolizumab.  However she is very uncomfortable stopping.  She thinks she can put up with the rash at least a bit longer if it does not get any worse.  We discussed the fact that we really do not have data  for length of treatment for pembrolizumab even in cases where it is clearly indicated.  The length of treatment has been arbitrary.  For example it is 12 months in the 003 study.  We do not know whether 6 months or 3 months might be equally good  On a tentative basis we are going to try to continue the pembrolizumab through this year, a minimum of 75-month  I would be comfortable stopping after that.  On the other hand I am more interested  in continuing the capecitabine and she will resume that today.  She will call with any issues that may develop before the next visit which will be 3 weeks from now.    GVirgie Dad , MD 08/11/19 4:51 PM [facility] [place] [id number]. [phone]   Fax. [phone]  I, KWilburn Mylar am acting as scribe for Dr. GVirgie Dad .  I, GLurline DelMD, have reviewed the above documentation for accuracy and completeness, and I agree with the above.

## 2019-08-11 ENCOUNTER — Other Ambulatory Visit: Payer: Self-pay

## 2019-08-11 ENCOUNTER — Inpatient Hospital Stay: Payer: 59

## 2019-08-11 ENCOUNTER — Inpatient Hospital Stay: Payer: 59 | Attending: Oncology

## 2019-08-11 ENCOUNTER — Inpatient Hospital Stay (HOSPITAL_BASED_OUTPATIENT_CLINIC_OR_DEPARTMENT_OTHER): Payer: 59 | Admitting: Oncology

## 2019-08-11 VITALS — BP 141/69 | HR 77 | Temp 98.3°F | Resp 18 | Ht 65.0 in | Wt 196.7 lb

## 2019-08-11 DIAGNOSIS — C50212 Malignant neoplasm of upper-inner quadrant of left female breast: Secondary | ICD-10-CM | POA: Insufficient documentation

## 2019-08-11 DIAGNOSIS — Z171 Estrogen receptor negative status [ER-]: Secondary | ICD-10-CM | POA: Diagnosis not present

## 2019-08-11 DIAGNOSIS — Z9013 Acquired absence of bilateral breasts and nipples: Secondary | ICD-10-CM | POA: Diagnosis not present

## 2019-08-11 DIAGNOSIS — R21 Rash and other nonspecific skin eruption: Secondary | ICD-10-CM | POA: Diagnosis not present

## 2019-08-11 DIAGNOSIS — Z923 Personal history of irradiation: Secondary | ICD-10-CM | POA: Diagnosis not present

## 2019-08-11 DIAGNOSIS — C50912 Malignant neoplasm of unspecified site of left female breast: Secondary | ICD-10-CM

## 2019-08-11 DIAGNOSIS — Z95828 Presence of other vascular implants and grafts: Secondary | ICD-10-CM

## 2019-08-11 DIAGNOSIS — E039 Hypothyroidism, unspecified: Secondary | ICD-10-CM | POA: Diagnosis not present

## 2019-08-11 DIAGNOSIS — Z5112 Encounter for antineoplastic immunotherapy: Secondary | ICD-10-CM | POA: Insufficient documentation

## 2019-08-11 DIAGNOSIS — Z9221 Personal history of antineoplastic chemotherapy: Secondary | ICD-10-CM | POA: Insufficient documentation

## 2019-08-11 DIAGNOSIS — I10 Essential (primary) hypertension: Secondary | ICD-10-CM | POA: Diagnosis not present

## 2019-08-11 DIAGNOSIS — Z79899 Other long term (current) drug therapy: Secondary | ICD-10-CM | POA: Insufficient documentation

## 2019-08-11 DIAGNOSIS — E7800 Pure hypercholesterolemia, unspecified: Secondary | ICD-10-CM | POA: Insufficient documentation

## 2019-08-11 LAB — CBC WITH DIFFERENTIAL/PLATELET
Abs Immature Granulocytes: 0.03 10*3/uL (ref 0.00–0.07)
Basophils Absolute: 0.1 10*3/uL (ref 0.0–0.1)
Basophils Relative: 1 %
Eosinophils Absolute: 0.2 10*3/uL (ref 0.0–0.5)
Eosinophils Relative: 4 %
HCT: 36.6 % (ref 36.0–46.0)
Hemoglobin: 11.9 g/dL — ABNORMAL LOW (ref 12.0–15.0)
Immature Granulocytes: 1 %
Lymphocytes Relative: 27 %
Lymphs Abs: 1.8 10*3/uL (ref 0.7–4.0)
MCH: 32.8 pg (ref 26.0–34.0)
MCHC: 32.5 g/dL (ref 30.0–36.0)
MCV: 100.8 fL — ABNORMAL HIGH (ref 80.0–100.0)
Monocytes Absolute: 0.5 10*3/uL (ref 0.1–1.0)
Monocytes Relative: 8 %
Neutro Abs: 4 10*3/uL (ref 1.7–7.7)
Neutrophils Relative %: 59 %
Platelets: 282 10*3/uL (ref 150–400)
RBC: 3.63 MIL/uL — ABNORMAL LOW (ref 3.87–5.11)
RDW: 14.9 % (ref 11.5–15.5)
WBC: 6.6 10*3/uL (ref 4.0–10.5)
nRBC: 0 % (ref 0.0–0.2)

## 2019-08-11 LAB — COMPREHENSIVE METABOLIC PANEL
ALT: 38 U/L (ref 0–44)
AST: 55 U/L — ABNORMAL HIGH (ref 15–41)
Albumin: 4 g/dL (ref 3.5–5.0)
Alkaline Phosphatase: 73 U/L (ref 38–126)
Anion gap: 12 (ref 5–15)
BUN: 12 mg/dL (ref 6–20)
CO2: 23 mmol/L (ref 22–32)
Calcium: 9.6 mg/dL (ref 8.9–10.3)
Chloride: 104 mmol/L (ref 98–111)
Creatinine, Ser: 0.81 mg/dL (ref 0.44–1.00)
GFR calc Af Amer: >60 mL/min (ref >60)
GFR calc non Af Amer: >60 mL/min (ref >60)
Glucose, Bld: 132 mg/dL — ABNORMAL HIGH (ref 70–99)
Potassium: 3.3 mmol/L — ABNORMAL LOW (ref 3.5–5.1)
Sodium: 139 mmol/L (ref 135–145)
Total Bilirubin: 0.4 mg/dL (ref 0.3–1.2)
Total Protein: 7.7 g/dL (ref 6.5–8.1)

## 2019-08-11 LAB — TSH: TSH: 4.403 u[IU]/mL — ABNORMAL HIGH (ref 0.308–3.960)

## 2019-08-11 MED ORDER — SODIUM CHLORIDE 0.9% FLUSH
10.0000 mL | Freq: Once | INTRAVENOUS | Status: AC
  Administered 2019-08-11: 10 mL
  Filled 2019-08-11: qty 10

## 2019-08-11 MED ORDER — SODIUM CHLORIDE 0.9 % IV SOLN
Freq: Once | INTRAVENOUS | Status: AC
  Administered 2019-08-11: 14:00:00 via INTRAVENOUS
  Filled 2019-08-11: qty 250

## 2019-08-11 MED ORDER — HEPARIN SOD (PORK) LOCK FLUSH 100 UNIT/ML IV SOLN
500.0000 [IU] | Freq: Once | INTRAVENOUS | Status: AC | PRN
  Administered 2019-08-11: 16:00:00 500 [IU]
  Filled 2019-08-11: qty 5

## 2019-08-11 MED ORDER — SODIUM CHLORIDE 0.9% FLUSH
10.0000 mL | INTRAVENOUS | Status: DC | PRN
  Administered 2019-08-11: 10 mL
  Filled 2019-08-11: qty 10

## 2019-08-11 MED ORDER — SODIUM CHLORIDE 0.9 % IV SOLN
200.0000 mg | Freq: Once | INTRAVENOUS | Status: AC
  Administered 2019-08-11: 200 mg via INTRAVENOUS
  Filled 2019-08-11: qty 8

## 2019-08-11 NOTE — Patient Instructions (Signed)
[facility] Discharge Instructions for Patients Receiving Chemotherapy  Today you received the following chemotherapy agents :  Pembrolizumab.  To help prevent nausea and vomiting after your treatment, we encourage you to take your nausea medication as prescribed.   If you develop nausea and vomiting that is not controlled by your nausea medication, call the clinic.   BELOW ARE SYMPTOMS THAT SHOULD BE REPORTED IMMEDIATELY:  *FEVER GREATER THAN 100.5 F  *CHILLS WITH OR WITHOUT FEVER  NAUSEA AND VOMITING THAT IS NOT CONTROLLED WITH YOUR NAUSEA MEDICATION  *UNUSUAL SHORTNESS OF BREATH  *UNUSUAL BRUISING OR BLEEDING  TENDERNESS IN MOUTH AND THROAT WITH OR WITHOUT PRESENCE OF ULCERS  *URINARY PROBLEMS  *BOWEL PROBLEMS  UNUSUAL RASH Items with * indicate a potential emergency and should be followed up as soon as possible.  Feel free to call the clinic should you have any questions or concerns. The clinic phone number is [phone].  Please show the CHEMO ALERT CARD at check-in to the Emergency Department and triage nurse.   

## 2019-08-12 ENCOUNTER — Telehealth: Payer: Self-pay | Admitting: Oncology

## 2019-08-12 NOTE — Telephone Encounter (Signed)
I talk with patient regarding schedule  

## 2019-08-29 ENCOUNTER — Other Ambulatory Visit: Payer: Self-pay | Admitting: Nurse Practitioner

## 2019-09-01 ENCOUNTER — Encounter: Payer: Self-pay | Admitting: Adult Health

## 2019-09-01 ENCOUNTER — Inpatient Hospital Stay: Payer: 59 | Attending: Oncology

## 2019-09-01 ENCOUNTER — Inpatient Hospital Stay (HOSPITAL_BASED_OUTPATIENT_CLINIC_OR_DEPARTMENT_OTHER): Payer: 59 | Admitting: Adult Health

## 2019-09-01 ENCOUNTER — Inpatient Hospital Stay: Payer: 59

## 2019-09-01 ENCOUNTER — Other Ambulatory Visit: Payer: Self-pay

## 2019-09-01 ENCOUNTER — Other Ambulatory Visit: Payer: Self-pay | Admitting: Oncology

## 2019-09-01 VITALS — BP 126/84 | HR 85 | Temp 98.5°F | Resp 18 | Ht 65.0 in | Wt 198.5 lb

## 2019-09-01 DIAGNOSIS — Z923 Personal history of irradiation: Secondary | ICD-10-CM | POA: Insufficient documentation

## 2019-09-01 DIAGNOSIS — Z9221 Personal history of antineoplastic chemotherapy: Secondary | ICD-10-CM | POA: Diagnosis not present

## 2019-09-01 DIAGNOSIS — Z95828 Presence of other vascular implants and grafts: Secondary | ICD-10-CM

## 2019-09-01 DIAGNOSIS — Z171 Estrogen receptor negative status [ER-]: Secondary | ICD-10-CM

## 2019-09-01 DIAGNOSIS — K219 Gastro-esophageal reflux disease without esophagitis: Secondary | ICD-10-CM | POA: Insufficient documentation

## 2019-09-01 DIAGNOSIS — C50912 Malignant neoplasm of unspecified site of left female breast: Secondary | ICD-10-CM

## 2019-09-01 DIAGNOSIS — Z5112 Encounter for antineoplastic immunotherapy: Secondary | ICD-10-CM | POA: Diagnosis not present

## 2019-09-01 DIAGNOSIS — C50212 Malignant neoplasm of upper-inner quadrant of left female breast: Secondary | ICD-10-CM | POA: Insufficient documentation

## 2019-09-01 DIAGNOSIS — I10 Essential (primary) hypertension: Secondary | ICD-10-CM | POA: Insufficient documentation

## 2019-09-01 DIAGNOSIS — G629 Polyneuropathy, unspecified: Secondary | ICD-10-CM | POA: Diagnosis not present

## 2019-09-01 DIAGNOSIS — M1990 Unspecified osteoarthritis, unspecified site: Secondary | ICD-10-CM | POA: Diagnosis not present

## 2019-09-01 DIAGNOSIS — Z9013 Acquired absence of bilateral breasts and nipples: Secondary | ICD-10-CM | POA: Diagnosis not present

## 2019-09-01 DIAGNOSIS — Z87442 Personal history of urinary calculi: Secondary | ICD-10-CM | POA: Insufficient documentation

## 2019-09-01 DIAGNOSIS — E7800 Pure hypercholesterolemia, unspecified: Secondary | ICD-10-CM | POA: Diagnosis not present

## 2019-09-01 DIAGNOSIS — R21 Rash and other nonspecific skin eruption: Secondary | ICD-10-CM | POA: Diagnosis not present

## 2019-09-01 DIAGNOSIS — E039 Hypothyroidism, unspecified: Secondary | ICD-10-CM | POA: Insufficient documentation

## 2019-09-01 DIAGNOSIS — Z79899 Other long term (current) drug therapy: Secondary | ICD-10-CM | POA: Diagnosis not present

## 2019-09-01 LAB — CBC WITH DIFFERENTIAL/PLATELET
Abs Immature Granulocytes: 0.05 10*3/uL (ref 0.00–0.07)
Basophils Absolute: 0 10*3/uL (ref 0.0–0.1)
Basophils Relative: 0 %
Eosinophils Absolute: 0.3 10*3/uL (ref 0.0–0.5)
Eosinophils Relative: 4 %
HCT: 37.6 % (ref 36.0–46.0)
Hemoglobin: 12.3 g/dL (ref 12.0–15.0)
Immature Granulocytes: 1 %
Lymphocytes Relative: 20 %
Lymphs Abs: 1.9 10*3/uL (ref 0.7–4.0)
MCH: 32.6 pg (ref 26.0–34.0)
MCHC: 32.7 g/dL (ref 30.0–36.0)
MCV: 99.7 fL (ref 80.0–100.0)
Monocytes Absolute: 0.9 10*3/uL (ref 0.1–1.0)
Monocytes Relative: 9 %
Neutro Abs: 6.2 10*3/uL (ref 1.7–7.7)
Neutrophils Relative %: 66 %
Platelets: 257 10*3/uL (ref 150–400)
RBC: 3.77 MIL/uL — ABNORMAL LOW (ref 3.87–5.11)
RDW: 16.2 % — ABNORMAL HIGH (ref 11.5–15.5)
WBC: 9.4 10*3/uL (ref 4.0–10.5)
nRBC: 0 % (ref 0.0–0.2)

## 2019-09-01 LAB — COMPREHENSIVE METABOLIC PANEL
ALT: 49 U/L — ABNORMAL HIGH (ref 0–44)
AST: 78 U/L — ABNORMAL HIGH (ref 15–41)
Albumin: 4.1 g/dL (ref 3.5–5.0)
Alkaline Phosphatase: 85 U/L (ref 38–126)
Anion gap: 13 (ref 5–15)
BUN: 12 mg/dL (ref 6–20)
CO2: 24 mmol/L (ref 22–32)
Calcium: 9.7 mg/dL (ref 8.9–10.3)
Chloride: 101 mmol/L (ref 98–111)
Creatinine, Ser: 0.8 mg/dL (ref 0.44–1.00)
GFR calc Af Amer: >60 mL/min (ref >60)
GFR calc non Af Amer: >60 mL/min (ref >60)
Glucose, Bld: 107 mg/dL — ABNORMAL HIGH (ref 70–99)
Potassium: 3.2 mmol/L — ABNORMAL LOW (ref 3.5–5.1)
Sodium: 138 mmol/L (ref 135–145)
Total Bilirubin: 0.4 mg/dL (ref 0.3–1.2)
Total Protein: 8 g/dL (ref 6.5–8.1)

## 2019-09-01 LAB — TSH: TSH: 9.435 u[IU]/mL — ABNORMAL HIGH (ref 0.308–3.960)

## 2019-09-01 MED ORDER — SODIUM CHLORIDE 0.9% FLUSH
10.0000 mL | INTRAVENOUS | Status: DC | PRN
  Administered 2019-09-01: 10 mL
  Filled 2019-09-01: qty 10

## 2019-09-01 MED ORDER — SODIUM CHLORIDE 0.9 % IV SOLN
Freq: Once | INTRAVENOUS | Status: AC
  Administered 2019-09-01: 14:00:00 via INTRAVENOUS
  Filled 2019-09-01: qty 250

## 2019-09-01 MED ORDER — SODIUM CHLORIDE 0.9% FLUSH
10.0000 mL | Freq: Once | INTRAVENOUS | Status: AC
  Administered 2019-09-01: 10 mL
  Filled 2019-09-01: qty 10

## 2019-09-01 MED ORDER — HEPARIN SOD (PORK) LOCK FLUSH 100 UNIT/ML IV SOLN
500.0000 [IU] | Freq: Once | INTRAVENOUS | Status: AC | PRN
  Administered 2019-09-01: 16:00:00 500 [IU]
  Filled 2019-09-01: qty 5

## 2019-09-01 MED ORDER — SODIUM CHLORIDE 0.9 % IV SOLN
200.0000 mg | Freq: Once | INTRAVENOUS | Status: AC
  Administered 2019-09-01: 200 mg via INTRAVENOUS
  Filled 2019-09-01: qty 8

## 2019-09-01 NOTE — Patient Instructions (Signed)
[facility] Discharge Instructions for Patients Receiving Chemotherapy  Today you received the following chemotherapy agents:  Keytruda.  To help prevent nausea and vomiting after your treatment, we encourage you to take your nausea medication as directed.   If you develop nausea and vomiting that is not controlled by your nausea medication, call the clinic.   BELOW ARE SYMPTOMS THAT SHOULD BE REPORTED IMMEDIATELY:  *FEVER GREATER THAN 100.5 F  *CHILLS WITH OR WITHOUT FEVER  NAUSEA AND VOMITING THAT IS NOT CONTROLLED WITH YOUR NAUSEA MEDICATION  *UNUSUAL SHORTNESS OF BREATH  *UNUSUAL BRUISING OR BLEEDING  TENDERNESS IN MOUTH AND THROAT WITH OR WITHOUT PRESENCE OF ULCERS  *URINARY PROBLEMS  *BOWEL PROBLEMS  UNUSUAL RASH Items with * indicate a potential emergency and should be followed up as soon as possible.  Feel free to call the clinic should you have any questions or concerns. The clinic phone number is [phone].  Please show the CHEMO ALERT CARD at check-in to the Emergency Department and triage nurse.    

## 2019-09-01 NOTE — Progress Notes (Signed)
[facility]  [phone] [phone]    ID: Alexandria Bradley OB: 1960-11-15  MR#: [id number]  JGG#:[id number]  Patient Care Team: [name], DO as PCP - General (Family Medicine) Josue Hector, MD as PCP - Cardiology (Cardiology) Magrinat, Virgie Dad, MD as Consulting Physician (Oncology) Brien Few, MD as Consulting Physician (Obstetrics and Gynecology) [name], MD as Consulting Physician (General Surgery) Delrae Rend, MD as Consulting Physician (Endocrinology) Wilford Corner, MD as Consulting Physician (Gastroenterology) Dorna Leitz, MD as Consulting Physician (Orthopedic Surgery) Josue Hector, MD as Consulting Physician (Cardiology) Irene Limbo, MD as Consulting Physician (Plastic Surgery) OTHER MD:    CHIEF COMPLAINT: Triple negative breast cancer, locally recurrent (s/p bilateral mastectomies)  CURRENT TREATMENT: adjuvant capecitabine and pembrolizumab   INTERVAL HISTORY: Alexandria Bradley returns today for follow-up and treatment of her locally recurrent triple negative breast cancer.   She continues on Pembrolizumab.  She developed a rash with the Pembrolizumab that is being managed by dermatology.  It is stable to slightly improved and she is continuing on treatment.  She also receives capecitabine given at 1547m BID two weeks on and one week off.     REVIEW OF SYSTEMS: Alexandria Bradley doing moderately well.  She did see dermatology last week and she is on a cream for her rash.  She has f/u with her in 6 weeks.  Her rash is stable to slightly improved. She also has been on an increased dose of synthroid for the past two weeks due to her elevated TSH.  Today's level is pending.  Alexandria Bradley doing well today.  She has no fever, chills, chest pain, palpitaitons, cough, shortness of breath, nausea, vomiting, headaches, bowel/bladder changes, pain, or any further concerns.  A detailed ROS Was otherwise non  contributory.     BREAST CANCER HISTORY: From the original intake note of 02/16/2014:  CHoyle Sauerpalpated a mass in her left breast early May and brought it immediately to her gynecologist attention. He set her up [facility] bilateral diagnostic [facility] Mammography showed a 1.3 cm oval mass with spiculated margins in the left breast at the 11:00 position. This was palpable. Ultrasound showed a 1.1 cm tall her than wide mass with a microlobulated margins. He was hypoechoic. There were no abnormalities noted in the left axilla.  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 KLyndon Codeat conference 02/16/2014), triple negative, with an MIB-1 of 50%.  The patient's subsequent history is as detailed below   PAST MEDICAL HISTORY: Past Medical History:  Diagnosis Date   Anxiety    Arthritis    Back pain    Breast cancer ([place]    left   Fatty liver    GERD (gastroesophageal reflux disease)    Headache(784.0)    PMH : migraines   History of kidney stones    Hypercholesterolemia    Hypertension    Hypothyroidism    Kidney stones    Osteoporosis    Personal history of chemotherapy    Personal history of radiation therapy    PONV (postoperative nausea and vomiting)    difficult to wake up   Pre-diabetes    Psoriasis    PVC's (premature ventricular contractions)    Radiation 08/18/14-10/07/14   Left breast    PAST SURGICAL HISTORY: Past Surgical History:  Procedure Laterality Date   BREAST BIOPSY  Left 11/10/2018   BREAST LUMPECTOMY Left 2015   BREAST LUMPECTOMY WITH AXILLARY LYMPH NODE BIOPSY  6/15   left   BREAST RECONSTRUCTION WITH PLACEMENT OF TISSUE EXPANDER AND ALLODERM Bilateral 04/12/2019   Procedure: BILATERAL BREAST RECONSTRUCTION WITH PLACEMENT OF TISSUE EXPANDERS; ALLODERM TO RIGHT CHEST;   Surgeon: Irene Limbo, MD;  Location: [facility];  Service: Plastics;  Laterality: Bilateral;   CARDIAC CATHETERIZATION     CHOLECYSTECTOMY     COLONOSCOPY N/A 06/27/2014   Procedure: COLONOSCOPY;  Surgeon: Lear Ng, MD;  Location: [facility];  Service: Endoscopy;  Laterality: N/A;   LATISSIMUS FLAP TO BREAST Left 04/12/2019   Procedure: LEFT LATISSIMUS DORSI FLAP TO LEFT CHEST;  Surgeon: Irene Limbo, MD;  Location: [facility];  Service: Plastics;  Laterality: Left;   LEFT HEART CATH AND CORONARY ANGIOGRAPHY N/A 07/24/2018   Procedure: LEFT HEART CATH AND CORONARY ANGIOGRAPHY;  Surgeon: Troy Sine, MD;  Location: [facility];  Service: Cardiovascular;  Laterality: N/A;   LIPOMA EXCISION     MASTECTOMY W/ SENTINEL NODE BIOPSY Bilateral 04/12/2019   Procedure: RIGHT BREAST RISK REDUCING MASTECTOMY; LEFT MASTECTOMY WITH LEFT AXILLARY SENTINEL LYMPH NODE BIOPSY WITH BLUE DYE INJECTION;  Surgeon: [name], MD;  Location: [facility];  Service: General;  Laterality: Bilateral;   MYOMECTOMY     PORT-A-CATH REMOVAL N/A 02/03/2015   Procedure: REMOVAL PORT-A-CATH;  Surgeon: [name], MD;  Location: [facility];  Service: General;  Laterality: N/A;   PORTACATH PLACEMENT N/A 03/01/2014   Procedure: INSERTION PORT-A-CATH;  Surgeon: [name], MD;  Location: [facility];  Service: General;  Laterality: N/A;   PORTACATH PLACEMENT N/A 11/18/2018   Procedure: INSERTION PORT-A-CATH WITH ULTRASOUND;  Surgeon: [name], MD;  Location: [facility];  Service: General;  Laterality: N/A;   REMOVAL OF TISSUE EXPANDER AND PLACEMENT OF IMPLANT Right 06/02/2019   Procedure: REMOVAL OF TISSUE EXPANDER RIGHT CHEST;  Surgeon: Irene Limbo, MD;  Location: [facility];  Service: Plastics;  Laterality: Right;   TONSILLECTOMY     TUBAL LIGATION     WOUND DEBRIDEMENT Left 06/02/2019   Procedure: LEFT CHEST DEBRIDEMENT;   Surgeon: Irene Limbo, MD;  Location: [facility];  Service: Plastics;  Laterality: Left;    FAMILY HISTORY: Family History  Problem Relation Age of Onset   Endometrial cancer Mother 77   Hearing loss Mother    Atrial fibrillation Mother    Lung cancer Father    Brain cancer Father    Heart disease Maternal Aunt    Atrial fibrillation Maternal Aunt    Lung cancer Maternal Uncle    Atrial fibrillation Maternal Uncle    Stroke Maternal Grandmother    Stroke Maternal Grandfather    Bladder Cancer Maternal Uncle    Multiple myeloma Maternal Uncle    Other Son        trisomy 78   The patient's father died at the age of 59 from what may have been metastatic lung cancer. The patient knows very little about the father's side of her family. Her mother is alive at age 50. She was recently diagnosed with endometrial cancer. The patient has one brother, 2 sisters. There is no history of breast or ovarian cancer in the family.   GYNECOLOGIC HISTORY:  Menarche age 40, first live birth age 4. The patient is GX P3. One child died shortly after birth. She stopped having periods approximately 2005. She did not use hormone  replacement. 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 Son [name] is  Nurse, adult in [place]. Daughter [name] lives in [place] and works for [employer] as an Therapist, sports.  The patient has three grandchildren.    ADVANCED DIRECTIVES: Not in place   HEALTH MAINTENANCE: Social History   Tobacco Use   Smoking status: Former Smoker    Packs/day: 0.50    Years: 15.00    Pack years: 7.50    Types: Cigarettes   Smokeless tobacco: Never Used   Tobacco comment: Quit  smoking cigarettes in 2002  Substance Use Topics   Alcohol use: Yes    Comment: social   Drug use: No     Colonoscopy: 06/27/2014, normal, [name]  PAP: 2013  Bone density: 04/10/2015, -1.1 ([facility])  Lipid panel:  Allergies  Allergen Reactions   Chlorhexidine Itching   Ciprofloxacin Anxiety, Rash and Other (See Comments)    GI upset   Prednisone Shortness Of Breath and Other (See Comments)    "jacks me up" jittery    Codeine Nausea Only   Cortisone Swelling and Other (See Comments)    "jack me up"     Current Outpatient Medications  Medication Sig Dispense Refill   augmented betamethasone dipropionate (DIPROLENE-AF) 0.05 % cream Apply 1 application topically 2 (two) times daily as needed.     calcium carbonate (TUMS - DOSED IN MG ELEMENTAL CALCIUM) 500 MG chewable tablet Chew 1 tablet by mouth at bedtime as needed for indigestion or heartburn.     capecitabine (XELODA) 500 MG tablet Take 3 tablets (1500 mg) by mouth twice daily, immediately after meals. Take for 14 days on, 7 days off, repeat every 21 days 84 tablet 7   diltiazem (CARDIZEM CD) 240 MG 24 hr capsule TAKE 1 CAPSULE BY MOUTH EVERY DAY 30 capsule 1   fluocinonide (LIDEX) 0.05 % external solution APPLY TO SCALP DAILY     lidocaine-prilocaine (EMLA) cream APPLY TO AFFECTED AREA ONCE AS DIRECTED     liothyronine (CYTOMEL) 5 MCG tablet Take 5 mcg by mouth daily before breakfast.      omeprazole (PRILOSEC) 20 MG capsule Take 1 capsule (20 mg total) by mouth 2 (two) times daily before a meal. (Patient taking differently: Take 20 mg by mouth 2 (two) times daily as needed (for heartburn or acid reflux). ) 60 capsule 6   ondansetron (ZOFRAN) 8 MG tablet Take 1 tablet (8 mg total) by mouth every 8 (eight) hours as needed for nausea or vomiting. 20 tablet 0   potassium chloride (K-DUR) 10 MEQ tablet TAKE 1 TABLET (10 MEQ TOTAL) BY MOUTH 2 (TWO) TIMES DAILY. 80 tablet 2   propranolol (INDERAL) 10 MG tablet  Take 10 mg by mouth daily as needed (for heart palpitations).      TIROSINT 125 MCG CAPS Take 1 capsule by mouth every morning.     triamcinolone cream (KENALOG) 0.1 % APPLY TO AFFECTED AREA TWICE A DAY *MAX ON INSURANCE     triamterene-hydrochlorothiazide (MAXZIDE-25) 37.5-25 MG tablet Take 1 tablet by mouth daily. 90 tablet 3   No current facility-administered medications for this visit.     OBJECTIVE: Middle-aged white woman in no acute distress  Vitals:   09/01/19 1317  BP: 126/84  Pulse: 85  Resp: 18  Temp: 98.5 F (36.9 C)  SpO2: 97%   Wt Readings from Last 3 Encounters:  09/01/19 198 lb 8 oz (90 kg)  08/11/19 196 lb 11.2 oz (89.2 kg)  07/21/19 197 lb 1.6 oz (89.4 kg)   Body mass index is 33.03 kg/m.    ECOG FS:1 - Symptomatic but completely ambulatory GENERAL: Patient is a well appearing female in no acute distress HEENT:  Sclerae anicteric.  Oropharynx clear and moist. No ulcerations or evidence of oropharyngeal candidiasis. Neck is supple.  NODES:  No cervical, supraclavicular, or axillary lymphadenopathy palpated.  BREAST EXAM:  Deferred. LUNGS:  Clear to auscultation bilaterally.  No wheezes or rhonchi. HEART:  Regular rate and rhythm. No murmur appreciated. ABDOMEN:  Soft, nontender.  Positive, normoactive bowel sounds. No organomegaly palpated. MSK:  No focal spinal tenderness to palpation. Full range of motion bilaterally in the upper extremities. EXTREMITIES:  No peripheral edema.   SKIN:  Clear with no obvious rashes or skin changes. No nail dyscrasia. NEURO:  Nonfocal. Well oriented.  Appropriate affect.    LAB RESULTS: No results found for: SPEP  Lab Results  Component Value Date   WBC 9.4 09/01/2019   NEUTROABS 6.2 09/01/2019   HGB 12.3 09/01/2019   HCT 37.6 09/01/2019   MCV 99.7 09/01/2019   PLT 257 09/01/2019      Chemistry      Component Value Date/Time   NA 139 08/11/2019 1248   NA 140 06/19/2017 1312   K 3.3 (L) 08/11/2019 1248    K 3.5 06/19/2017 1312   CL 104 08/11/2019 1248   CO2 23 08/11/2019 1248   CO2 25 06/19/2017 1312   BUN 12 08/11/2019 1248   BUN 18.4 06/19/2017 1312   CREATININE 0.81 08/11/2019 1248   CREATININE 0.79 06/09/2019 1326   CREATININE 0.8 06/19/2017 1312      Component Value Date/Time   CALCIUM 9.6 08/11/2019 1248   CALCIUM 10.2 06/19/2017 1312   ALKPHOS 73 08/11/2019 1248   ALKPHOS 86 06/19/2017 1312   AST 55 (H) 08/11/2019 1248   AST 69 (H) 06/09/2019 1326   AST 15 06/19/2017 1312   ALT 38 08/11/2019 1248   ALT 40 06/09/2019 1326   ALT 22 06/19/2017 1312   BILITOT 0.4 08/11/2019 1248   BILITOT 0.3 06/09/2019 1326   BILITOT 0.26 06/19/2017 1312       No results found for: LABCA2  No components found for: LABCA125  No results for input(s): INR in the last 168 hours.  Urinalysis    Component Value Date/Time   COLORURINE YELLOW 03/13/2009 2251   APPEARANCEUR CLOUDY (A) 03/13/2009 2251   LABSPEC 1.020 06/15/2014 1101   PHURINE 6.0 06/15/2014 1101   PHURINE 5.5 03/13/2009 2251   GLUCOSEU Negative 06/15/2014 1101   HGBUR Negative 06/15/2014 1101   HGBUR NEGATIVE 03/13/2009 2251   BILIRUBINUR Color Interference 06/15/2014 1101   KETONESUR Negative 06/15/2014 1101   KETONESUR NEGATIVE 03/13/2009 2251   PROTEINUR 30 06/15/2014 1101   PROTEINUR NEGATIVE 03/13/2009 2251   UROBILINOGEN 0.2 06/15/2014 1101   NITRITE Negative 06/15/2014 1101   NITRITE NEGATIVE 03/13/2009 2251   LEUKOCYTESUR Trace 06/15/2014 1101    STUDIES: No results found.  ASSESSMENT: 58 y.o. Alexandria Bradley woman status post left breast upper inner quadrant biopsy 02/08/2014 for a clinical T1c N0, stage IA invasive ductal carcinoma, grade 3, triple negative, with an MIB-1 of 50%.  (1) status post left lumpectomy and sentinel lymph node  sampling 03/01/2014 for a pT1c pN0, stage IA invasive ductal carcinoma, grade 3, with negative margins, repeat prognostic panel again triple negative.  (2) adjuvant  chemotherapy started a 03/25/2014 consisting of cyclophosphamide and doxorubicin given in dose dense fashion x4, completed 05/06/2014; followed by weekly paclitaxel x1, on 05/27/2014, poorly tolerated;   (a) switched to Abraxane starting 06/10/2014, with 11 Abraxane doses planned  (b) dose reduced by 20% because of neutropenia starting 07/01/2014 dose  (c) discontinued after 4th Abraxane dose 07/15/2014 due to neuropathy  (3) adjuvant radiation completed 10/07/2014.  Left breast with breath hold technique/ 45 Gy at 1.8 Gy per fraction x 25 fractions.   Left breast boost/ 16 Gy at 2 Gy per fraction x 8 fractions  (4) genetics counseling 07/06/2015 through the Breast/Ovarian cancer gene panel offered by [facility] found no deleterious mutations in ATM, BARD1, BRCA1, BRCA2, BRIP1, CDH1, CHEK2, EPCAM, FANCC, MLH1, MSH2, MSH6, NBN, PALB2, PMS2, PTEN, RAD51C, RAD51D, TP53, and XRCC2.  (5) question of sleep apnea  (6) transverse colon lesion noted on CT scan from 06/17/2014 - negative biopsy, also showed fatty liver  (a) repeat CT scan of the abdomen and pelvis with contrast 02/15/2016 negative  RECURRENT DISEASE: February 2020 (7) biopsy of palpable mass upper inner left breast 11/10/2018 confirms invasive ductal carcinoma, grade 2 or 3, triple negative, with an MIB-1 of 80%.  (a) CT scans of the chest abdomen and pelvis with contrast showed no stage IV disease  (b) bone scan 11/13/2018 shows no definite metastasis to bone  (8) neoadjuvant chemotherapy consisting of carboplatin and gemcitabine given days 1 and 8 of each 21-day cycle, for 6 cycles, started 11/26/2018, completed 03/18/2019  (a) repeat ultrasound 01/25/2019 (after 3 cycles) shows no change in measurable breast mass   (9) bilateral mastectomies as follows   (a) left mastectomy and sentinel lymph node sampling with immediate tissue expander placement and latissimus flap 04/12/2019 showed a residual ypT1a ypN0 invasive ductal carcinoma,  grade 3, with negative margins; a single sentinel lymph node was removed.  Margins were negative  (b) right mastectomy with immediate implant placement showed no malignancy  (c) right expander removed 06/02/2019 secondary to infectious complications  (10) molecular testing on the 11/10/2018 biopsy showed negative P D-L1, negative androgen receptor, stable MSI and proficient mismatch repair status.  The tumor mutational burden was low.  No PIK3 mutation was detected.  There was a pathogenic TP53 variant and negative ER PR and HER-2 were confirmed.  (11) Adjuvant pembrolizumab given every 3 weeks beginning 06/09/2019  (12) Adjuvant Capecitabine 1527m PO BID for 14 days on and 7 days off on a 21 day cycle starting 06/30/2019.   PLAN: CKylenais doing moderately well today.  Her labs are stable and she will start her Capecitabine back.  She is continuing on Pembrolizumab despite the rash and is tolerating it moderately well with the creams prescribed by her dermatologist.  Our working plan will be to touch base with her and how she is doing in 3 weeks to make decisions on continuing it or not.    CHoyle Sauerand I discussed how her follow up would look if she stops the pembrolizumab.  At that depends on how she is doing with the Capecitabine.    We will see CKamiback in 3 weeks for labs, f/u, and her next treatment.  She was recommended to continue with the appropriate pandemic precautions. She knows to call for any questions that may arise between now and her next  appointment.  We are happy to see her sooner if needed.  A total of (20) minutes of face-to-face time was spent with this patient with greater than 50% of that time in counseling and care-coordination.   Wilber Bihari, NP 09/01/19 1:31 PM Medical Oncology and Hematology [facility] [place], [place] [address] Tel. [phone]    Fax. [phone]

## 2019-09-02 ENCOUNTER — Telehealth: Payer: Self-pay | Admitting: Oncology

## 2019-09-02 NOTE — Telephone Encounter (Signed)
I left a message regarding schedule  

## 2019-09-16 ENCOUNTER — Other Ambulatory Visit: Payer: Self-pay | Admitting: Oncology

## 2019-09-21 ENCOUNTER — Other Ambulatory Visit: Payer: Self-pay | Admitting: Nurse Practitioner

## 2019-09-21 ENCOUNTER — Other Ambulatory Visit: Payer: Self-pay | Admitting: Oncology

## 2019-09-21 NOTE — Progress Notes (Signed)
[place]  [phone] [phone]    ID: Alexandria Bradley OB: 31-Oct-1960  MR#: [id number]  NLG#:[id number]  Patient Care Team: Alexandria Pal, DO as PCP - General (Family Medicine) Alexandria Hector, MD as PCP - Cardiology (Cardiology) Alexandria Bradley, Alexandria Dad, MD as Consulting Physician (Oncology) Alexandria Few, MD as Consulting Physician (Obstetrics and Gynecology) Alexandria Bookbinder, MD as Consulting Physician (General Surgery) Alexandria Rend, MD as Consulting Physician (Endocrinology) Alexandria Corner, MD as Consulting Physician (Gastroenterology) Alexandria Leitz, MD as Consulting Physician (Orthopedic Surgery) Alexandria Hector, MD as Consulting Physician (Cardiology) Alexandria Limbo, MD as Consulting Physician (Plastic Surgery) OTHER MD:    CHIEF COMPLAINT: Triple negative breast cancer, locally recurrent (s/p bilateral mastectomies)  CURRENT TREATMENT: adjuvant capecitabine; completing pembrolizumab   INTERVAL HISTORY: Alexandria Bradley returns today for follow-up and treatment of her locally recurrent triple negative breast cancer.   She continues on Pembrolizumab.  She has generally done well with this medication except that she has developed a rash which is going to be at least partly due to it.  It is very itchy.  She is working with her dermatologist and is on various creams that she is using and it is helping.  She also had the skin lesion from the dorsum of her right knee removed and that is a bit inflamed.  She is worried that the Pembrolizumab may also activate or irritate other precancerous skin lesions.  Finally she wonders if it has caused her increased arthritis pains.  Some days she hurts everywhere and some days she does not.  She cannot quite figure out what brings it on.  With all this on we are considering discontinuing the pembrolizumab.  She also receives capecitabine given at 1526m BID two weeks on and one week off.  She has  had no significant diarrhea from this in fact she is mildly constipated.  She does not have mouth sores.  She has not had palmar plantar erythrodysesthesia.   REVIEW OF SYSTEMS: [name] has a little bit of discomfort in the right upper quadrant of her abdomen.  This is does not seem to be related to food bowel movements or position.  It comes and goes and is most of the time not there.  She has had no unusual headaches visual changes nausea or vomiting.  She thinks her sense of balance is not as good as it used to be.  There have been no intercurrent falls.  A detailed review of systems today was otherwise stable.   BREAST CANCER HISTORY: From the original intake note of 02/16/2014:  CHoyle Sauerpalpated a mass in her left breast early May and brought it immediately to her gynecologist attention. He [facility] Mammography showed a 1.3 cm oval mass with spiculated margins in the left breast at the 11:00 position. This was palpable. Ultrasound showed a 1.1 cm tall her than wide mass with a microlobulated margins. He was hypoechoic. There were no abnormalities noted in the left axilla.  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 KLyndon Codeat conference 02/16/2014), triple negative, with an MIB-1 of 50%.  The patient's subsequent history is as detailed below   PAST MEDICAL HISTORY: Past Medical History:  Diagnosis Date  . Anxiety   . Arthritis   . Back pain   . Breast cancer ([place]  left  . Fatty liver   . GERD (gastroesophageal reflux disease)   . Headache(784.0)    PMH : migraines  . History of kidney stones   . Hypercholesterolemia   . Hypertension   . Hypothyroidism   . Kidney stones   . Osteoporosis   . Personal history of chemotherapy   . Personal history of radiation  therapy   . PONV (postoperative nausea and vomiting)    difficult to wake up  . Pre-diabetes   . Psoriasis   . PVC's (premature ventricular contractions)   . Radiation 08/18/14-10/07/14   Left breast    PAST SURGICAL HISTORY: Past Surgical History:  Procedure Laterality Date  . BREAST BIOPSY Left 11/10/2018  . BREAST LUMPECTOMY Left 2015  . BREAST LUMPECTOMY WITH AXILLARY LYMPH NODE BIOPSY  6/15   left  . BREAST RECONSTRUCTION WITH PLACEMENT OF TISSUE EXPANDER AND ALLODERM Bilateral 04/12/2019   Procedure: BILATERAL BREAST RECONSTRUCTION WITH PLACEMENT OF TISSUE EXPANDERS; ALLODERM TO RIGHT CHEST;  Surgeon: Alexandria Limbo, MD;  Location: [place];  Service: Plastics;  Laterality: Bilateral;  . CARDIAC CATHETERIZATION    . CHOLECYSTECTOMY    . COLONOSCOPY N/A 06/27/2014   Procedure: COLONOSCOPY;  Surgeon: Alexandria Ng, MD;  Location: [facility] ENDOSCOPY;  Service: Endoscopy;  Laterality: N/A;  . LATISSIMUS FLAP TO BREAST Left 04/12/2019   Procedure: LEFT LATISSIMUS DORSI FLAP TO LEFT CHEST;  Surgeon: Alexandria Limbo, MD;  Location: [place];  Service: Plastics;  Laterality: Left;  . LEFT HEART CATH AND CORONARY ANGIOGRAPHY N/A 07/24/2018   Procedure: LEFT HEART CATH AND CORONARY ANGIOGRAPHY;  Surgeon: Alexandria Sine, MD;  Location: [place] CV LAB;  Service: Cardiovascular;  Laterality: N/A;  . LIPOMA EXCISION    . MASTECTOMY W/ SENTINEL NODE BIOPSY Bilateral 04/12/2019   Procedure: RIGHT BREAST RISK REDUCING MASTECTOMY; LEFT MASTECTOMY WITH LEFT AXILLARY SENTINEL LYMPH NODE BIOPSY WITH BLUE DYE INJECTION;  Surgeon: Alexandria Bookbinder, MD;  Location: [place];  Service: General;  Laterality: Bilateral;  . MYOMECTOMY    . PORT-A-CATH REMOVAL N/A 02/03/2015   Procedure: REMOVAL PORT-A-CATH;  Surgeon: Alexandria Bookbinder, MD;  Location: [place];  Service: General;  Laterality: N/A;  . PORTACATH PLACEMENT N/A 03/01/2014   Procedure: INSERTION PORT-A-CATH;  Surgeon: Alexandria Bookbinder, MD;  Location: [place];  Service: General;  Laterality: N/A;  . PORTACATH PLACEMENT N/A 11/18/2018   Procedure: INSERTION PORT-A-CATH WITH ULTRASOUND;  Surgeon: Alexandria Bookbinder, MD;  Location: [place];  Service: General;  Laterality: N/A;  . REMOVAL OF TISSUE EXPANDER AND PLACEMENT OF IMPLANT Right 06/02/2019   Procedure: REMOVAL OF TISSUE EXPANDER RIGHT CHEST;  Surgeon: Alexandria Limbo, MD;  Location: [place];  Service: Plastics;  Laterality: Right;  . TONSILLECTOMY    . TUBAL LIGATION    . WOUND DEBRIDEMENT Left 06/02/2019   Procedure: LEFT CHEST DEBRIDEMENT;  Surgeon: Alexandria Limbo, MD;  Location: [place];  Service: Plastics;  Laterality: Left;    FAMILY HISTORY: Family History  Problem Relation Age of Onset  . Endometrial cancer Mother 18  . Hearing loss Mother   . Atrial fibrillation Mother   . Lung cancer Father   . Brain cancer Father   . Heart disease Maternal Aunt   . Atrial fibrillation Maternal Aunt   . Lung cancer Maternal Uncle   . Atrial fibrillation Maternal Uncle   . Stroke Maternal Grandmother   . Stroke Maternal Grandfather   . Bladder Cancer Maternal Uncle   .  Multiple myeloma Maternal Uncle   . Other Son        trisomy 32   The patient's father died at the age of 46 from what may have been metastatic lung cancer. The patient knows very little about the father's side of her family. Her mother is alive at age 51. She was recently diagnosed with endometrial cancer. The patient has one brother, 2 sisters. There is no history of breast or ovarian cancer in the family.   GYNECOLOGIC HISTORY:  Menarche age 1, first live birth age 85. The patient is GX P3. One child died shortly after birth. She stopped having periods approximately 2005. She did not use hormone replacement. 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 Son Alexandria Bradley is  Nurse, adult in [place]. Daughter Alexandria Bradley lives in [place] and works for [employer] as an Therapist, sports.  The patient has three grandchildren.    ADVANCED DIRECTIVES: Not in place   HEALTH MAINTENANCE: Social History   Tobacco Use  . Smoking status: Former Smoker    Packs/day: 0.50    Years: 15.00    Pack years: 7.50    Types: Cigarettes  . Smokeless tobacco: Never Used  . Tobacco comment: Quit smoking cigarettes in 2002  Substance Use Topics  . Alcohol use: Yes    Comment: social  . Drug use: No     Colonoscopy: 06/27/2014, normal, [name]  PAP: 2013  Bone density: 04/10/2015, -1.1 ([facility])  Lipid panel:  Allergies  Allergen Reactions  . Chlorhexidine Itching  . Ciprofloxacin Anxiety, Rash and Other (See Comments)    GI upset  . Prednisone Shortness Of Breath and Other (See Comments)    "jacks me up" jittery   . Codeine Nausea Only  . Cortisone Swelling and Other (See Comments)    "jack me up"     Current Outpatient Medications  Medication Sig Dispense Refill  . augmented betamethasone dipropionate (DIPROLENE-AF) 0.05 % cream Apply 1 application topically 2 (two) times daily as needed.    . calcium carbonate (TUMS - DOSED IN MG ELEMENTAL CALCIUM) 500 MG chewable tablet Chew 1 tablet by mouth at bedtime as needed for indigestion or heartburn.    . capecitabine (XELODA) 500 MG tablet Take 3 tablets (1500 mg) by mouth twice daily, immediately after meals. Take for 14 days on, 7 days off, repeat every 21 days 84 tablet 7  . diltiazem (CARDIZEM CD) 240 MG 24 hr capsule TAKE 1 CAPSULE BY MOUTH EVERY DAY 30 capsule 0  . fluocinonide (LIDEX) 0.05 % external solution APPLY TO SCALP DAILY    . lidocaine-prilocaine (EMLA) cream APPLY TO AFFECTED AREA ONCE AS DIRECTED    .  liothyronine (CYTOMEL) 5 MCG tablet Take 5 mcg by mouth daily before breakfast.     . omeprazole (PRILOSEC) 20 MG capsule Take 1 capsule (20 mg total) by mouth 2 (two) times daily as needed (for heartburn or acid reflux). 180 capsule 2  . ondansetron (ZOFRAN) 8 MG tablet Take 1 tablet (8 mg total) by mouth every 8 (eight) hours as needed for nausea or vomiting. 20 tablet 0  . potassium chloride (KLOR-CON) 10 MEQ tablet TAKE 1 TABLET (10 MEQ TOTAL) BY MOUTH 2 (TWO) TIMES DAILY. 180 tablet 1  .  propranolol (INDERAL) 10 MG tablet Take 10 mg by mouth daily as needed (for heart palpitations).     Alexandria Bradley 125 MCG CAPS Take 1 capsule by mouth every morning.    . triamcinolone cream (KENALOG) 0.1 % APPLY TO AFFECTED AREA TWICE A DAY *MAX ON INSURANCE    . triamterene-hydrochlorothiazide (MAXZIDE-25) 37.5-25 MG tablet Take 1 tablet by mouth daily. 90 tablet 3   No current facility-administered medications for this visit.    OBJECTIVE: Middle-aged white woman who appears stated age 58:   09/22/19 1350  BP: 124/62  Pulse: 90  Resp: 18  Temp: 98.7 F (37.1 C)  SpO2: 99%   Wt Readings from Last 3 Encounters:  09/01/19 198 lb 8 oz (90 kg)  08/11/19 196 lb 11.2 oz (89.2 kg)  07/21/19 197 lb 1.6 oz (89.4 kg)   Body mass index is 33.03 kg/m.    ECOG FS:1 - Symptomatic but completely ambulatory  Sclerae unicteric, EOMs intact Wearing a mask No cervical or supraclavicular adenopathy Lungs no rales or rhonchi Heart regular rate and rhythm Abd soft, nontender, positive bowel sounds MSK no focal spinal tenderness, no upper extremity lymphedema Neuro: nonfocal, well oriented, appropriate affect Breasts: Status post bilateral mastectomies.  There is no evidence of local recurrence.  Both axillae are benign.   LAB RESULTS: No results found for: SPEP  Lab Results  Component Value Date   WBC 9.7 09/22/2019   NEUTROABS 5.3 09/22/2019   HGB 12.6 09/22/2019   HCT 37.7 09/22/2019   MCV  102.2 (H) 09/22/2019   PLT 283 09/22/2019      Chemistry      Component Value Date/Time   NA 138 09/01/2019 1300   NA 140 06/19/2017 1312   K 3.2 (L) 09/01/2019 1300   K 3.5 06/19/2017 1312   CL 101 09/01/2019 1300   CO2 24 09/01/2019 1300   CO2 25 06/19/2017 1312   BUN 12 09/01/2019 1300   BUN 18.4 06/19/2017 1312   CREATININE 0.80 09/01/2019 1300   CREATININE 0.79 06/09/2019 1326   CREATININE 0.8 06/19/2017 1312      Component Value Date/Time   CALCIUM 9.7 09/01/2019 1300   CALCIUM 10.2 06/19/2017 1312   ALKPHOS 85 09/01/2019 1300   ALKPHOS 86 06/19/2017 1312   AST 78 (H) 09/01/2019 1300   AST 69 (H) 06/09/2019 1326   AST 15 06/19/2017 1312   ALT 49 (H) 09/01/2019 1300   ALT 40 06/09/2019 1326   ALT 22 06/19/2017 1312   BILITOT 0.4 09/01/2019 1300   BILITOT 0.3 06/09/2019 1326   BILITOT 0.26 06/19/2017 1312       No results found for: LABCA2  No components found for: LABCA125  No results for input(s): INR in the last 168 hours.  Urinalysis    Component Value Date/Time   COLORURINE YELLOW 03/13/2009 2251   APPEARANCEUR CLOUDY (A) 03/13/2009 2251   LABSPEC 1.020 06/15/2014 1101   PHURINE 6.0 06/15/2014 1101   PHURINE 5.5 03/13/2009 2251   GLUCOSEU Negative 06/15/2014 1101   HGBUR Negative 06/15/2014 1101   HGBUR NEGATIVE 03/13/2009 2251   BILIRUBINUR Color Interference 06/15/2014 1101   KETONESUR Negative 06/15/2014 1101   KETONESUR NEGATIVE 03/13/2009 2251   PROTEINUR 30 06/15/2014 1101   PROTEINUR NEGATIVE 03/13/2009 2251   UROBILINOGEN 0.2 06/15/2014 1101   NITRITE Negative 06/15/2014 1101   NITRITE NEGATIVE 03/13/2009 2251   LEUKOCYTESUR Trace 06/15/2014 1101    STUDIES: No results found.  ASSESSMENT: 58 y.o. [employer]  woman status post left breast upper inner quadrant biopsy 02/08/2014 for a clinical T1c N0, stage IA invasive ductal carcinoma, grade 3, triple negative, with an MIB-1 of 50%.  (1) status post left lumpectomy and sentinel  lymph node sampling 03/01/2014 for a pT1c pN0, stage IA invasive ductal carcinoma, grade 3, with negative margins, repeat prognostic panel again triple negative.  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  Left breast with breath hold technique/ 45 Gy at 1.8 Gy per fraction x 25 fractions.   Left breast boost/ 16 Gy at 2 Gy per fraction x 8 fractions  (4) genetics counseling 07/06/2015 through the Breast/Ovarian cancer gene panel offered by [facility] found no deleterious mutations in ATM, BARD1, BRCA1, BRCA2, BRIP1, CDH1, CHEK2, EPCAM, FANCC, MLH1, MSH2, MSH6, NBN, PALB2, PMS2, PTEN, RAD51C, RAD51D, TP53, and XRCC2.  (5) question of sleep apnea  (6) transverse colon lesion noted on CT scan from 06/17/2014 - negative biopsy, also showed fatty liver  (a) repeat CT scan of the abdomen and pelvis with contrast 02/15/2016 negative  RECURRENT DISEASE: February 2020 (7) biopsy of palpable mass upper inner left breast 11/10/2018 confirms invasive ductal carcinoma, grade 2 or 3, triple negative, with an MIB-1 of 80%.  (a) CT scans of the chest abdomen and pelvis with contrast showed no stage IV disease  (b) bone scan 11/13/2018 shows no definite metastasis to bone  (8) neoadjuvant chemotherapy consisting of carboplatin and gemcitabine given days 1 and 8 of each 21-day cycle, for 6 cycles, started 11/26/2018, completed 03/18/2019  (a) repeat ultrasound 01/25/2019 (after 3 cycles) shows no change in measurable breast mass   (9) bilateral mastectomies as follows   (a)  left mastectomy and sentinel lymph node sampling with immediate tissue expander placement and latissimus flap 04/12/2019 showed a residual ypT1a ypN0 invasive ductal carcinoma, grade 3, with negative margins; a single sentinel lymph node was removed.  Margins were negative  (b) right mastectomy with immediate implant placement showed no malignancy  (c) right expander removed 06/02/2019 secondary to infectious complications  (10) molecular testing on the 11/10/2018 biopsy showed negative PD-L1, negative androgen receptor, stable MSI and proficient mismatch repair status.  The tumor mutational burden was low.  No PIK3 mutation was detected.  There was a pathogenic TP53 variant and negative ER PR and HER-2 were confirmed.  (11) Adjuvant pembrolizumab given every 3 weeks beginning 06/09/2019, last dose 09/22/2019  (12) Adjuvant Capecitabine 1564m PO BID for 14 days on and 7 days off on a 21 day cycle starting 06/30/2019, to be continued through March 2021.   PLAN: CDealvahas made a valiant effort to continue on the pembrolizumab despite the side effects listed above.  Today we discussed the fact that we really do not know that this is effective in her situation and secondly that we do not know whether 1 dose is as good as 12 or 24.  We simply do not have that data.  Given the difficulties she is having my vote is to discontinue the medication.  In particular I would be uncomfortable with her receiving the Covid vaccine at the same time as she  is on pembrolizumab since I do not have data that that is safe or not.  It is better to stop the pembrolizumab at this point and then after 3 or 4 weeks if she has access to the vaccine go ahead and receive it.  On the other hand we do have good data on capecitabine.  Luckily she is tolerating that well.  The plan is to continue that for total of 6 months which will get [place] through March 2021.  Accordingly I would postpone her reconstruction until April 2021  She  is agreeable to this plan.  She will have lab work every 21 days to make sure that she can continue on the Xeloda.  She will see me every 6 weeks.  We will make sure to flush with the lab since she would like to keep her port at least until the time that she has her definitive surgery  She knows to call for any other issues that may develop before then.  Alexandria Bradley. Deon Ivey, MD 09/22/19 2:10 PM Medical Oncology and Hematology [facility] [place], [place] [address] Tel. [phone]    Fax. [phone]   I, Wilburn Mylar, am acting as scribe for Dr. Virgie Bradley. Bryla Burek.  I, Lurline Del MD, have reviewed the above documentation for accuracy and completeness, and I agree with the above.

## 2019-09-22 ENCOUNTER — Encounter: Payer: Self-pay | Admitting: Oncology

## 2019-09-22 ENCOUNTER — Inpatient Hospital Stay: Payer: 59

## 2019-09-22 ENCOUNTER — Inpatient Hospital Stay (HOSPITAL_BASED_OUTPATIENT_CLINIC_OR_DEPARTMENT_OTHER): Payer: 59 | Admitting: Oncology

## 2019-09-22 ENCOUNTER — Other Ambulatory Visit: Payer: Self-pay

## 2019-09-22 VITALS — BP 124/62 | HR 90 | Temp 98.7°F | Resp 18 | Ht 65.0 in

## 2019-09-22 DIAGNOSIS — Z171 Estrogen receptor negative status [ER-]: Secondary | ICD-10-CM

## 2019-09-22 DIAGNOSIS — K760 Fatty (change of) liver, not elsewhere classified: Secondary | ICD-10-CM | POA: Diagnosis not present

## 2019-09-22 DIAGNOSIS — C50212 Malignant neoplasm of upper-inner quadrant of left female breast: Secondary | ICD-10-CM | POA: Diagnosis not present

## 2019-09-22 DIAGNOSIS — C50912 Malignant neoplasm of unspecified site of left female breast: Secondary | ICD-10-CM

## 2019-09-22 DIAGNOSIS — Z5112 Encounter for antineoplastic immunotherapy: Secondary | ICD-10-CM | POA: Diagnosis not present

## 2019-09-22 DIAGNOSIS — Z95828 Presence of other vascular implants and grafts: Secondary | ICD-10-CM

## 2019-09-22 LAB — COMPREHENSIVE METABOLIC PANEL
ALT: 34 U/L (ref 0–44)
AST: 55 U/L — ABNORMAL HIGH (ref 15–41)
Albumin: 4.1 g/dL (ref 3.5–5.0)
Alkaline Phosphatase: 81 U/L (ref 38–126)
Anion gap: 15 (ref 5–15)
BUN: 13 mg/dL (ref 6–20)
CO2: 25 mmol/L (ref 22–32)
Calcium: 9.7 mg/dL (ref 8.9–10.3)
Chloride: 100 mmol/L (ref 98–111)
Creatinine, Ser: 0.81 mg/dL (ref 0.44–1.00)
GFR calc Af Amer: >60 mL/min (ref >60)
GFR calc non Af Amer: >60 mL/min (ref >60)
Glucose, Bld: 77 mg/dL (ref 70–99)
Potassium: 3.5 mmol/L (ref 3.5–5.1)
Sodium: 140 mmol/L (ref 135–145)
Total Bilirubin: 0.4 mg/dL (ref 0.3–1.2)
Total Protein: 8.1 g/dL (ref 6.5–8.1)

## 2019-09-22 LAB — TSH: TSH: 6.36 u[IU]/mL — ABNORMAL HIGH (ref 0.308–3.960)

## 2019-09-22 LAB — CBC WITH DIFFERENTIAL/PLATELET
Abs Immature Granulocytes: 0.03 10*3/uL (ref 0.00–0.07)
Basophils Absolute: 0.1 10*3/uL (ref 0.0–0.1)
Basophils Relative: 1 %
Eosinophils Absolute: 0.3 10*3/uL (ref 0.0–0.5)
Eosinophils Relative: 3 %
HCT: 37.7 % (ref 36.0–46.0)
Hemoglobin: 12.6 g/dL (ref 12.0–15.0)
Immature Granulocytes: 0 %
Lymphocytes Relative: 31 %
Lymphs Abs: 3 10*3/uL (ref 0.7–4.0)
MCH: 34.1 pg — ABNORMAL HIGH (ref 26.0–34.0)
MCHC: 33.4 g/dL (ref 30.0–36.0)
MCV: 102.2 fL — ABNORMAL HIGH (ref 80.0–100.0)
Monocytes Absolute: 1 10*3/uL (ref 0.1–1.0)
Monocytes Relative: 11 %
Neutro Abs: 5.3 10*3/uL (ref 1.7–7.7)
Neutrophils Relative %: 54 %
Platelets: 283 10*3/uL (ref 150–400)
RBC: 3.69 MIL/uL — ABNORMAL LOW (ref 3.87–5.11)
RDW: 16.3 % — ABNORMAL HIGH (ref 11.5–15.5)
WBC: 9.7 10*3/uL (ref 4.0–10.5)
nRBC: 0 % (ref 0.0–0.2)

## 2019-09-22 MED ORDER — SODIUM CHLORIDE 0.9% FLUSH
10.0000 mL | Freq: Once | INTRAVENOUS | Status: AC
  Administered 2019-09-22: 14:00:00 10 mL
  Filled 2019-09-22: qty 10

## 2019-09-22 MED ORDER — SODIUM CHLORIDE 0.9% FLUSH
10.0000 mL | INTRAVENOUS | Status: DC | PRN
  Administered 2019-09-22: 10 mL
  Filled 2019-09-22: qty 10

## 2019-09-22 MED ORDER — SODIUM CHLORIDE 0.9 % IV SOLN
200.0000 mg | Freq: Once | INTRAVENOUS | Status: AC
  Administered 2019-09-22: 200 mg via INTRAVENOUS
  Filled 2019-09-22: qty 8

## 2019-09-22 MED ORDER — HEPARIN SOD (PORK) LOCK FLUSH 100 UNIT/ML IV SOLN
500.0000 [IU] | Freq: Once | INTRAVENOUS | Status: AC | PRN
  Administered 2019-09-22: 500 [IU]
  Filled 2019-09-22: qty 5

## 2019-09-22 MED ORDER — SODIUM CHLORIDE 0.9 % IV SOLN
Freq: Once | INTRAVENOUS | Status: AC
  Filled 2019-09-22: qty 250

## 2019-10-07 ENCOUNTER — Other Ambulatory Visit: Payer: Self-pay | Admitting: Oncology

## 2019-10-07 ENCOUNTER — Telehealth: Payer: Self-pay | Admitting: *Deleted

## 2019-10-07 MED ORDER — CHOLESTYRAMINE 4 GM/DOSE PO POWD: 4.0000 g | Freq: Two times a day (BID) | ORAL | 1 refills | Status: DC

## 2019-10-07 MED ORDER — DIPHENOXYLATE-ATROPINE 2.5-0.025 MG PO TABS: 1.0000 | ORAL_TABLET | Freq: Two times a day (BID) | ORAL | 0 refills | Status: DC | PRN

## 2019-10-07 NOTE — Telephone Encounter (Signed)
Pt is having increased diarrhea going from 2 to 5 watery stools a day and having abd cramping with gas.  Pt completed 2 week cycle of xeloda 1/5   She is using max amount of imodium ( 6 tabs a day) and asked " is there something else I can use ?"  She states " gas pains woke me up from sleep ".  Information will be given to MD for further recommendations.

## 2019-10-07 NOTE — Telephone Encounter (Signed)
See prescriptions

## 2019-10-12 ENCOUNTER — Telehealth: Payer: Self-pay | Admitting: *Deleted

## 2019-10-12 NOTE — Telephone Encounter (Signed)
This RN spoke with pt per her call wanting to discuss restarting Xeloda tomorrow since issues of diarrhea with cramping last week and start of Questran.  Per discussion- Alexandria Bradley states diarrhea has lessened - and post starting the medication - she was mildly constipated. She states yesterday and today - her bowels are moving with better formed stools.  She states abd cramping and tenderness has resolved.  This RN noted pt is scheduled for lab work tomorrow and discussed with pt her question may be best answered by seeing a provider.  Her dose may need to be held a little longer or dose adjusted for less side effects.  Alexandria Bradley is in agreement to the above.  Appointment made with [facility]/NP tomorrow.

## 2019-10-13 ENCOUNTER — Ambulatory Visit (HOSPITAL_COMMUNITY)
Admission: RE | Admit: 2019-10-13 | Discharge: 2019-10-13 | Disposition: A | Discharge status: Home / Self Care | Payer: 59 | Source: Ambulatory Visit | Attending: Adult Health | Admitting: Adult Health

## 2019-10-13 ENCOUNTER — Inpatient Hospital Stay: Payer: 59

## 2019-10-13 ENCOUNTER — Other Ambulatory Visit: Payer: Self-pay

## 2019-10-13 ENCOUNTER — Inpatient Hospital Stay: Payer: 59 | Attending: Oncology

## 2019-10-13 ENCOUNTER — Encounter: Payer: Self-pay | Admitting: Adult Health

## 2019-10-13 ENCOUNTER — Inpatient Hospital Stay (HOSPITAL_BASED_OUTPATIENT_CLINIC_OR_DEPARTMENT_OTHER): Payer: 59 | Admitting: Adult Health

## 2019-10-13 ENCOUNTER — Telehealth: Payer: Self-pay

## 2019-10-13 VITALS — BP 130/73 | HR 76 | Temp 98.5°F | Resp 17 | Ht 65.0 in | Wt 196.6 lb

## 2019-10-13 DIAGNOSIS — Z171 Estrogen receptor negative status [ER-]: Secondary | ICD-10-CM

## 2019-10-13 DIAGNOSIS — C50212 Malignant neoplasm of upper-inner quadrant of left female breast: Secondary | ICD-10-CM | POA: Diagnosis not present

## 2019-10-13 DIAGNOSIS — C50912 Malignant neoplasm of unspecified site of left female breast: Secondary | ICD-10-CM

## 2019-10-13 DIAGNOSIS — Z95828 Presence of other vascular implants and grafts: Secondary | ICD-10-CM

## 2019-10-13 LAB — CBC WITH DIFFERENTIAL/PLATELET
Abs Immature Granulocytes: 0.04 10*3/uL (ref 0.00–0.07)
Basophils Absolute: 0 10*3/uL (ref 0.0–0.1)
Basophils Relative: 1 %
Eosinophils Absolute: 0.1 10*3/uL (ref 0.0–0.5)
Eosinophils Relative: 1 %
HCT: 37.5 % (ref 36.0–46.0)
Hemoglobin: 12.5 g/dL (ref 12.0–15.0)
Immature Granulocytes: 1 %
Lymphocytes Relative: 21 %
Lymphs Abs: 1.7 10*3/uL (ref 0.7–4.0)
MCH: 34.7 pg — ABNORMAL HIGH (ref 26.0–34.0)
MCHC: 33.3 g/dL (ref 30.0–36.0)
MCV: 104.2 fL — ABNORMAL HIGH (ref 80.0–100.0)
Monocytes Absolute: 0.6 10*3/uL (ref 0.1–1.0)
Monocytes Relative: 7 %
Neutro Abs: 5.5 10*3/uL (ref 1.7–7.7)
Neutrophils Relative %: 69 %
Platelets: 256 10*3/uL (ref 150–400)
RBC: 3.6 MIL/uL — ABNORMAL LOW (ref 3.87–5.11)
RDW: 16.3 % — ABNORMAL HIGH (ref 11.5–15.5)
WBC: 8 10*3/uL (ref 4.0–10.5)
nRBC: 0 % (ref 0.0–0.2)

## 2019-10-13 LAB — COMPREHENSIVE METABOLIC PANEL
ALT: 32 U/L (ref 0–44)
AST: 54 U/L — ABNORMAL HIGH (ref 15–41)
Albumin: 4.1 g/dL (ref 3.5–5.0)
Alkaline Phosphatase: 83 U/L (ref 38–126)
Anion gap: 14 (ref 5–15)
BUN: 14 mg/dL (ref 6–20)
CO2: 22 mmol/L (ref 22–32)
Calcium: 9.1 mg/dL (ref 8.9–10.3)
Chloride: 103 mmol/L (ref 98–111)
Creatinine, Ser: 1.02 mg/dL — ABNORMAL HIGH (ref 0.44–1.00)
GFR calc Af Amer: >60 mL/min (ref >60)
GFR calc non Af Amer: 60 mL/min — ABNORMAL LOW (ref >60)
Glucose, Bld: 166 mg/dL — ABNORMAL HIGH (ref 70–99)
Potassium: 3.5 mmol/L (ref 3.5–5.1)
Sodium: 139 mmol/L (ref 135–145)
Total Bilirubin: 0.4 mg/dL (ref 0.3–1.2)
Total Protein: 7.9 g/dL (ref 6.5–8.1)

## 2019-10-13 LAB — TSH: TSH: 6.373 u[IU]/mL — ABNORMAL HIGH (ref 0.308–3.960)

## 2019-10-13 MED ORDER — SODIUM CHLORIDE 0.9% FLUSH
10.0000 mL | Freq: Once | INTRAVENOUS | Status: AC
  Administered 2019-10-13: 10 mL
  Filled 2019-10-13: qty 10

## 2019-10-13 MED ORDER — HEPARIN SOD (PORK) LOCK FLUSH 100 UNIT/ML IV SOLN
500.0000 [IU] | Freq: Once | INTRAVENOUS | Status: AC
  Administered 2019-10-13: 500 [IU]
  Filled 2019-10-13: qty 5

## 2019-10-13 MED ORDER — FILGRASTIM-SNDZ 480 MCG/0.8ML IJ SOSY: 480.0000 ug | PREFILLED_SYRINGE | Freq: Once | INTRAMUSCULAR | Status: DC

## 2019-10-13 NOTE — Progress Notes (Signed)
[place]  [phone] [phone]    ID: Alexandria Bradley OB: [birth year]  MR#: [id number]  KGM#:[id number]  Patient Care Team: [name], DO as PCP - General (Family Medicine) Josue Hector, MD as PCP - Cardiology (Cardiology) Magrinat, Virgie Dad, MD as Consulting Physician (Oncology) Brien Few, MD as Consulting Physician (Obstetrics and Gynecology) [name], MD as Consulting Physician (General Surgery) Delrae Rend, MD as Consulting Physician (Endocrinology) Wilford Corner, MD as Consulting Physician (Gastroenterology) Dorna Leitz, MD as Consulting Physician (Orthopedic Surgery) Josue Hector, MD as Consulting Physician (Cardiology) Irene Limbo, MD as Consulting Physician (Plastic Surgery) OTHER MD:    CHIEF COMPLAINT: Triple negative breast cancer, locally recurrent (s/p bilateral mastectomies)  CURRENT TREATMENT: adjuvant capecitabine;   INTERVAL HISTORY: Alexandria Bradley returns today for follow-up and treatment of her locally recurrent triple negative breast cancer.   She recently completed Pembrolizumab and has continued on Capecitabine 2 weeks on and one week off.     REVIEW OF SYSTEMS: Alexandria Bradley is here for evaluation about whether or not to restart the Capecitabine.  She says taht last week she felt awful.  She developed diarrhea, and stomach pain that started one week ago.  She says she cried a lot last week.  She had as many as 5-6 times a day of watery stool and stomach cramping.  Alexandria Bradley called [place] last week and started Questran which helped her diarrhea.  Her bowels improved, however she notes that she did have four bowel movements yesterday.  This morning she had one loose bowel movement.  She notes sometimes she has h/o IBS.    She notes the cramping has resolved.  No recent antibiotics.  She continues to have a rash and it is stable, and she hopes it will improve the further she gets from the  Pembrolizumab. She is drinking enough water.    Alexandria Bradley notes some intermittent right thigh pain that comes and goes over the past week.  She notes one evening it was very bad.  It is improved today.    Alexandria Bradley denies any other issues and a detailed ROS was otherwise non contributory.     BREAST CANCER HISTORY: From the original intake note of 02/16/2014:  Alexandria Bradley palpated a mass in her left breast early May and brought it immediately to her gynecologist attention. He set her up for bilateral diagnostic mammography and left ultrasonography at [facility] 02/07/2014. Mammography showed a 1.3 cm oval mass with spiculated margins in the left breast at the 11:00 position. This was palpable. Ultrasound showed a 1.1 cm tall her than wide mass with a microlobulated margins. He was hypoechoic. There were no abnormalities noted in the left axilla.  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 Lyndon Code at conference 02/16/2014), triple negative, with an MIB-1 of 50%.  The patient's subsequent history is as detailed below   PAST MEDICAL HISTORY: Past Medical History:  Diagnosis Date  . Anxiety   . Arthritis   . Back pain   . Breast cancer ([place])    left  . Fatty liver   . GERD (gastroesophageal reflux disease)   . Headache(784.0)    PMH : migraines  . History of kidney stones   . Hypercholesterolemia   . Hypertension   . Hypothyroidism   . Kidney stones   . Osteoporosis   . Personal history of chemotherapy   .  Personal history of radiation therapy   . PONV (postoperative nausea and vomiting)    difficult to wake up  . Pre-diabetes   . Psoriasis   . PVC's (premature ventricular contractions)   . Radiation 08/18/14-10/07/14   Left breast    PAST SURGICAL HISTORY: Past Surgical History:  Procedure Laterality Date  . BREAST BIOPSY Left 11/10/2018  . BREAST LUMPECTOMY Left 2015   . BREAST LUMPECTOMY WITH AXILLARY LYMPH NODE BIOPSY  6/15   left  . BREAST RECONSTRUCTION WITH PLACEMENT OF TISSUE EXPANDER AND ALLODERM Bilateral 04/12/2019   Procedure: BILATERAL BREAST RECONSTRUCTION WITH PLACEMENT OF TISSUE EXPANDERS; ALLODERM TO RIGHT CHEST;  Surgeon: Irene Limbo, MD;  Location: [place];  Service: Plastics;  Laterality: Bilateral;  . CARDIAC CATHETERIZATION    . CHOLECYSTECTOMY    . COLONOSCOPY N/A 06/27/2014   Procedure: COLONOSCOPY;  Surgeon: Lear Ng, MD;  Location: [facility] ENDOSCOPY;  Service: Endoscopy;  Laterality: N/A;  . LATISSIMUS FLAP TO BREAST Left 04/12/2019   Procedure: LEFT LATISSIMUS DORSI FLAP TO LEFT CHEST;  Surgeon: Irene Limbo, MD;  Location: [place];  Service: Plastics;  Laterality: Left;  . LEFT HEART CATH AND CORONARY ANGIOGRAPHY N/A 07/24/2018   Procedure: LEFT HEART CATH AND CORONARY ANGIOGRAPHY;  Surgeon: Troy Sine, MD;  Location: [place] CV LAB;  Service: Cardiovascular;  Laterality: N/A;  . LIPOMA EXCISION    . MASTECTOMY W/ SENTINEL NODE BIOPSY Bilateral 04/12/2019   Procedure: RIGHT BREAST RISK REDUCING MASTECTOMY; LEFT MASTECTOMY WITH LEFT AXILLARY SENTINEL LYMPH NODE BIOPSY WITH BLUE DYE INJECTION;  Surgeon: [name], MD;  Location: [place];  Service: General;  Laterality: Bilateral;  . MYOMECTOMY    . PORT-A-CATH REMOVAL N/A 02/03/2015   Procedure: REMOVAL PORT-A-CATH;  Surgeon: [name], MD;  Location: [place];  Service: General;  Laterality: N/A;  . PORTACATH PLACEMENT N/A 03/01/2014   Procedure: INSERTION PORT-A-CATH;  Surgeon: [name], MD;  Location: [place];  Service: General;  Laterality: N/A;  . PORTACATH PLACEMENT N/A 11/18/2018   Procedure: INSERTION PORT-A-CATH WITH ULTRASOUND;  Surgeon: [name], MD;  Location: [place];  Service: General;  Laterality: N/A;  . REMOVAL OF TISSUE EXPANDER AND PLACEMENT OF IMPLANT Right 06/02/2019   Procedure:  REMOVAL OF TISSUE EXPANDER RIGHT CHEST;  Surgeon: Irene Limbo, MD;  Location: [place];  Service: Plastics;  Laterality: Right;  . TONSILLECTOMY    . TUBAL LIGATION    . WOUND DEBRIDEMENT Left 06/02/2019   Procedure: LEFT CHEST DEBRIDEMENT;  Surgeon: Irene Limbo, MD;  Location: [place];  Service: Plastics;  Laterality: Left;    FAMILY HISTORY: Family History  Problem Relation Age of Onset  . Endometrial cancer Mother 71  . Hearing loss Mother   . Atrial fibrillation Mother   . Lung cancer Father   . Brain cancer Father   . Heart disease Maternal Aunt   . Atrial fibrillation Maternal Aunt   . Lung cancer Maternal Uncle   . Atrial fibrillation Maternal Uncle   . Stroke Maternal Grandmother   . Stroke Maternal Grandfather   . Bladder Cancer Maternal Uncle   . Multiple myeloma Maternal Uncle   . Other Son        trisomy 22   The patient's father died at the age of 27 from what may have been metastatic lung cancer. The patient knows very little about the father's side of her family. Her mother is alive at age 3. She  was recently diagnosed with endometrial cancer. The patient has one brother, 2 sisters. There is no history of breast or ovarian cancer in the family.   GYNECOLOGIC HISTORY:  Menarche age 18, first live birth age 73. The patient is GX P3. One child died shortly after birth. She stopped having periods approximately 2005. She did not use hormone replacement. 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 Son [name] is  Nurse, adult in [place]. Daughter [name] lives in [place] and works for [employer] as an Therapist, sports.  The patient has three grandchildren.     ADVANCED DIRECTIVES: Not in place   HEALTH MAINTENANCE: Social History   Tobacco Use  . Smoking status: Former Smoker    Packs/day: 0.50    Years: 15.00    Pack years: 7.50    Types: Cigarettes  . Smokeless tobacco: Never Used  . Tobacco comment: Quit smoking cigarettes in 2002  Substance Use Topics  . Alcohol use: Yes    Comment: social  . Drug use: No     Colonoscopy: 06/27/2014, normal, [name]  PAP: 2013  Bone density: 04/10/2015, -1.1 ([facility])  Lipid panel:  Allergies  Allergen Reactions  . Chlorhexidine Itching  . Ciprofloxacin Anxiety, Rash and Other (See Comments)    GI upset  . Prednisone Shortness Of Breath and Other (See Comments)    "jacks me up" jittery   . Codeine Nausea Only  . Cortisone Swelling and Other (See Comments)    "jack me up"     Current Outpatient Medications  Medication Sig Dispense Refill  . augmented betamethasone dipropionate (DIPROLENE-AF) 0.05 % cream Apply 1 application topically 2 (two) times daily as needed.    . calcium carbonate (TUMS - DOSED IN MG ELEMENTAL CALCIUM) 500 MG chewable tablet Chew 1 tablet by mouth at bedtime as needed for indigestion or heartburn.    . capecitabine (XELODA) 500 MG tablet Take 3 tablets (1500 mg) by mouth twice daily, immediately after meals. Take for 14 days on, 7 days off, repeat every 21 days 84 tablet 7  . cholestyramine (QUESTRAN) 4 GM/DOSE powder Take 1 packet (4 g total) by mouth 2 (two) times daily with a meal. 378 g 1  . diltiazem (CARDIZEM CD) 240 MG 24 hr capsule TAKE 1 CAPSULE BY MOUTH EVERY DAY 30 capsule 0  . diphenoxylate-atropine (LOMOTIL) 2.5-0.025 MG tablet Take 1 tablet by mouth 2 (two) times daily as needed for diarrhea or loose stools. 30 tablet 0  . fluocinonide (LIDEX) 0.05 % external solution APPLY TO SCALP DAILY    . lidocaine-prilocaine (EMLA) cream APPLY TO AFFECTED AREA ONCE AS DIRECTED    . liothyronine (CYTOMEL) 5 MCG tablet Take 5 mcg by mouth daily before breakfast.      . omeprazole (PRILOSEC) 20 MG capsule Take 1 capsule (20 mg total) by mouth 2 (two) times daily as needed (for heartburn or acid reflux). 180 capsule 2  . ondansetron (ZOFRAN) 8 MG tablet Take 1 tablet (8 mg total) by mouth every 8 (eight) hours as needed for nausea or vomiting. 20 tablet 0  . potassium chloride (KLOR-CON) 10 MEQ tablet TAKE 1 TABLET (10 MEQ TOTAL) BY MOUTH 2 (TWO) TIMES DAILY. 180 tablet 1  . propranolol (INDERAL) 10 MG  tablet Take 10 mg by mouth daily as needed (for heart palpitations).     Serita Grammes 125 MCG CAPS Take 1 capsule by mouth every morning.    . triamcinolone cream (KENALOG) 0.1 % APPLY TO AFFECTED AREA TWICE A DAY *MAX ON INSURANCE    . triamterene-hydrochlorothiazide (MAXZIDE-25) 37.5-25 MG tablet Take 1 tablet by mouth daily. 90 tablet 3   No current facility-administered medications for this visit.    OBJECTIVE: Vitals:   10/13/19 1421  BP: 130/73  Pulse: 76  Resp: 17  Temp: 98.5 F (36.9 C)  SpO2: 97%   Wt Readings from Last 3 Encounters:  10/13/19 196 lb 9.6 oz (89.2 kg)  09/01/19 198 lb 8 oz (90 kg)  08/11/19 196 lb 11.2 oz (89.2 kg)   Body mass index is 32.72 kg/m.    ECOG FS:1 - Symptomatic but completely ambulatory GENERAL: Patient is a well appearing female in no acute distress HEENT:  Sclerae anicteric.  Mask in place. Neck is supple.  NODES:  No cervical, supraclavicular, or axillary lymphadenopathy palpated.  BREAST EXAM:  Deferred. LUNGS:  Clear to auscultation bilaterally.  No wheezes or rhonchi. HEART:  Regular rate and rhythm. No murmur appreciated. ABDOMEN:  Soft, nontender.  Positive, normoactive bowel sounds. No organomegaly palpated. MSK:  No focal spinal tenderness to palpation. Full range of motion bilaterally in the upper extremities. EXTREMITIES:  No peripheral edema.   SKIN:  Fading macular erythematous rash noted on arms. No nail dyscrasia. NEURO:  Nonfocal. Well oriented.  Appropriate affect.    LAB RESULTS:  No results found for: SPEP  Lab Results  Component Value Date   WBC 8.0 10/13/2019   NEUTROABS 5.5 10/13/2019   HGB 12.5 10/13/2019   HCT 37.5 10/13/2019   MCV 104.2 (H) 10/13/2019   PLT 256 10/13/2019      Chemistry      Component Value Date/Time   NA 140 09/22/2019 1340   NA 140 06/19/2017 1312   K 3.5 09/22/2019 1340   K 3.5 06/19/2017 1312   CL 100 09/22/2019 1340   CO2 25 09/22/2019 1340   CO2 25 06/19/2017 1312   BUN 13 09/22/2019 1340   BUN 18.4 06/19/2017 1312   CREATININE 0.81 09/22/2019 1340   CREATININE 0.79 06/09/2019 1326   CREATININE 0.8 06/19/2017 1312      Component Value Date/Time   CALCIUM 9.7 09/22/2019 1340   CALCIUM 10.2 06/19/2017 1312   ALKPHOS 81 09/22/2019 1340   ALKPHOS 86 06/19/2017 1312   AST 55 (H) 09/22/2019 1340   AST 69 (H) 06/09/2019 1326   AST 15 06/19/2017 1312   ALT 34 09/22/2019 1340   ALT 40 06/09/2019 1326   ALT 22 06/19/2017 1312   BILITOT 0.4 09/22/2019 1340   BILITOT 0.3 06/09/2019 1326   BILITOT 0.26 06/19/2017 1312       No results found for: LABCA2  No components found for: LABCA125  No results for input(s): INR in the last 168 hours.  Urinalysis    Component Value Date/Time   COLORURINE YELLOW 03/13/2009 2251   APPEARANCEUR CLOUDY (A) 03/13/2009 2251   LABSPEC 1.020 06/15/2014 1101   PHURINE 6.0 06/15/2014 1101   PHURINE 5.5 03/13/2009 2251   GLUCOSEU Negative 06/15/2014 1101   HGBUR Negative 06/15/2014 1101   HGBUR NEGATIVE 03/13/2009 2251   BILIRUBINUR Color Interference 06/15/2014 1101   KETONESUR Negative 06/15/2014 [place] 03/13/2009 2251   PROTEINUR 30 06/15/2014 1101   PROTEINUR  NEGATIVE 03/13/2009 2251   UROBILINOGEN 0.2 06/15/2014 1101   NITRITE Negative 06/15/2014 1101   NITRITE NEGATIVE 03/13/2009 2251   LEUKOCYTESUR Trace 06/15/2014 1101    STUDIES: No results found.  ASSESSMENT: 60 y.o. [place] woman status post left breast upper inner quadrant biopsy  02/08/2014 for a clinical T1c N0, stage IA invasive ductal carcinoma, grade 3, triple negative, with an MIB-1 of 50%.  (1) status post left lumpectomy and sentinel lymph node sampling 03/01/2014 for a pT1c pN0, stage IA invasive ductal carcinoma, grade 3, with negative margins, repeat prognostic panel again triple negative.  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  Left breast with breath hold technique/ 45 Gy at 1.8 Gy per fraction x 25 fractions.   Left breast boost/ 16 Gy at 2 Gy per fraction x 8 fractions  (4) genetics counseling 07/06/2015 through the Breast/Ovarian cancer gene panel offered by [facility] found no deleterious mutations in ATM, BARD1, BRCA1, BRCA2, BRIP1, CDH1, CHEK2, EPCAM, FANCC, MLH1, MSH2, MSH6, NBN, PALB2, PMS2, PTEN, RAD51C, RAD51D, TP53, and XRCC2.  (5) question of sleep apnea  (6) transverse colon lesion noted on CT scan from 06/17/2014 - negative biopsy, also showed fatty liver  (a) repeat CT scan of the abdomen and pelvis with contrast 02/15/2016 negative  RECURRENT DISEASE: February 2020 (7) biopsy of palpable mass upper inner left breast 11/10/2018 confirms invasive ductal carcinoma, grade 2 or 3, triple negative, with an MIB-1 of 80%.  (a) CT scans of the chest abdomen and pelvis with contrast showed no stage IV disease  (b) bone scan 11/13/2018 shows no definite metastasis to bone  (8) neoadjuvant chemotherapy consisting of carboplatin and gemcitabine given days 1 and 8 of each 21-day cycle, for 6 cycles, started  11/26/2018, completed 03/18/2019  (a) repeat ultrasound 01/25/2019 (after 3 cycles) shows no change in measurable breast mass   (9) bilateral mastectomies as follows   (a) left mastectomy and sentinel lymph node sampling with immediate tissue expander placement and latissimus flap 04/12/2019 showed a residual ypT1a ypN0 invasive ductal carcinoma, grade 3, with negative margins; a single sentinel lymph node was removed.  Margins were negative  (b) right mastectomy with immediate implant placement showed no malignancy  (c) right expander removed 06/02/2019 secondary to infectious complications  (10) molecular testing on the 11/10/2018 biopsy showed negative PD-L1, negative androgen receptor, stable MSI and proficient mismatch repair status.  The tumor mutational burden was low.  No PIK3 mutation was detected.  There was a pathogenic TP53 variant and negative ER PR and HER-2 were confirmed.  (11) Adjuvant pembrolizumab given every 3 weeks beginning 06/09/2019, last dose 09/22/2019  (12) Adjuvant Capecitabine 1548m PO BID for 14 days on and 7 days off on a 21 day cycle starting 06/30/2019, to be continued through March 2021.   PLAN: CShaneikais here today for evaluation of her lab work and diarrhea while taking her capecitabine.  I reviewed with her that since she had grade 2 diarrhea, she needs to hold the medication this week.  We will need to see her back next week for labs and f/u to evaluate how her stomach  issues are and whehter or not we need to make any dose reductions moving forward.    Belmira also needs an xray of her right femur to evaluate this pain.  I agve her a handout on diarrhea, dietary recommendations, and a flowsheet to keep track of it.    We will see Aimar back in 1 week for labs and f/u.  She was recommended to continue with the appropriate pandemic precautions. She knows to call for any other issues that may develop before then.  The above was reviewed with Dr. Jana Hakim in  detail who is in agreement with the plan and helped formulate it.    A total of (20) minutes of face-to-face time was spent with this patient with greater than 50% of that time in counseling and care-coordination.   Alexandria Bihari, NP 10/13/19 2:28 PM Medical Oncology and Hematology [facility] [place], [place] [address] Tel. [phone]    Fax. [phone]

## 2019-10-13 NOTE — Telephone Encounter (Signed)
-----   Message from Gardenia Phlegm, NP sent at 10/13/2019  3:53 PM EST ----- Xrays normal.  Good news.  Would do tylenol or aleve for leg pain, drink plenty of water. ----- Message ----- From: Interface, Rad Results In Sent: 10/13/2019   3:46 PM EST To: Gardenia Phlegm, NP

## 2019-10-13 NOTE — Telephone Encounter (Signed)
Spoke with patient to inform of normal x-rays.  Per NP, take tylenol or aleve for leg pain and drink plenty of water.  Patient voiced understanding and thanks for call.

## 2019-10-14 ENCOUNTER — Other Ambulatory Visit: Payer: Self-pay | Admitting: Nurse Practitioner

## 2019-10-14 ENCOUNTER — Telehealth: Payer: Self-pay | Admitting: Adult Health

## 2019-10-14 NOTE — Telephone Encounter (Signed)
I alk with patient regarding schedule

## 2019-10-20 ENCOUNTER — Other Ambulatory Visit: Payer: Self-pay

## 2019-10-20 ENCOUNTER — Inpatient Hospital Stay (HOSPITAL_BASED_OUTPATIENT_CLINIC_OR_DEPARTMENT_OTHER): Payer: 59 | Admitting: Adult Health

## 2019-10-20 ENCOUNTER — Inpatient Hospital Stay: Payer: 59

## 2019-10-20 ENCOUNTER — Encounter: Payer: Self-pay | Admitting: Adult Health

## 2019-10-20 VITALS — BP 138/65 | HR 80 | Temp 97.4°F | Resp 18 | Ht 65.0 in | Wt 196.6 lb

## 2019-10-20 DIAGNOSIS — R197 Diarrhea, unspecified: Secondary | ICD-10-CM | POA: Diagnosis not present

## 2019-10-20 DIAGNOSIS — Z9013 Acquired absence of bilateral breasts and nipples: Secondary | ICD-10-CM | POA: Insufficient documentation

## 2019-10-20 DIAGNOSIS — C50212 Malignant neoplasm of upper-inner quadrant of left female breast: Secondary | ICD-10-CM | POA: Diagnosis not present

## 2019-10-20 DIAGNOSIS — Z923 Personal history of irradiation: Secondary | ICD-10-CM | POA: Diagnosis not present

## 2019-10-20 DIAGNOSIS — Z171 Estrogen receptor negative status [ER-]: Secondary | ICD-10-CM | POA: Diagnosis not present

## 2019-10-20 LAB — CBC WITH DIFFERENTIAL/PLATELET
Abs Immature Granulocytes: 0.03 10*3/uL (ref 0.00–0.07)
Basophils Absolute: 0.1 10*3/uL (ref 0.0–0.1)
Basophils Relative: 1 %
Eosinophils Absolute: 0.2 10*3/uL (ref 0.0–0.5)
Eosinophils Relative: 2 %
HCT: 39.1 % (ref 36.0–46.0)
Hemoglobin: 12.9 g/dL (ref 12.0–15.0)
Immature Granulocytes: 0 %
Lymphocytes Relative: 18 %
Lymphs Abs: 1.5 10*3/uL (ref 0.7–4.0)
MCH: 34.6 pg — ABNORMAL HIGH (ref 26.0–34.0)
MCHC: 33 g/dL (ref 30.0–36.0)
MCV: 104.8 fL — ABNORMAL HIGH (ref 80.0–100.0)
Monocytes Absolute: 0.7 10*3/uL (ref 0.1–1.0)
Monocytes Relative: 8 %
Neutro Abs: 6.2 10*3/uL (ref 1.7–7.7)
Neutrophils Relative %: 71 %
Platelets: 261 10*3/uL (ref 150–400)
RBC: 3.73 MIL/uL — ABNORMAL LOW (ref 3.87–5.11)
RDW: 15.8 % — ABNORMAL HIGH (ref 11.5–15.5)
WBC: 8.6 10*3/uL (ref 4.0–10.5)
nRBC: 0 % (ref 0.0–0.2)

## 2019-10-20 LAB — COMPREHENSIVE METABOLIC PANEL
ALT: 34 U/L (ref 0–44)
AST: 57 U/L — ABNORMAL HIGH (ref 15–41)
Albumin: 4.4 g/dL (ref 3.5–5.0)
Alkaline Phosphatase: 89 U/L (ref 38–126)
Anion gap: 14 (ref 5–15)
BUN: 10 mg/dL (ref 6–20)
CO2: 26 mmol/L (ref 22–32)
Calcium: 10 mg/dL (ref 8.9–10.3)
Chloride: 102 mmol/L (ref 98–111)
Creatinine, Ser: 0.9 mg/dL (ref 0.44–1.00)
GFR calc Af Amer: >60 mL/min (ref >60)
GFR calc non Af Amer: >60 mL/min (ref >60)
Glucose, Bld: 113 mg/dL — ABNORMAL HIGH (ref 70–99)
Potassium: 3.8 mmol/L (ref 3.5–5.1)
Sodium: 142 mmol/L (ref 135–145)
Total Bilirubin: 0.5 mg/dL (ref 0.3–1.2)
Total Protein: 8.5 g/dL — ABNORMAL HIGH (ref 6.5–8.1)

## 2019-10-20 NOTE — Progress Notes (Deleted)
[place]  [phone]) [phone] Fax:(336) [phone]    ID: Alexandria Bradley OB: [birth year]  MR#: [id number]  IFO#:[id number]  Patient Care Team: [name], DO as PCP - General (Family Medicine) Josue Hector, MD as PCP - Cardiology (Cardiology) Magrinat, Virgie Dad, MD as Consulting Physician (Oncology) Brien Few, MD as Consulting Physician (Obstetrics and Gynecology) [name], MD as Consulting Physician (General Surgery) Delrae Rend, MD as Consulting Physician (Endocrinology) Wilford Corner, MD as Consulting Physician (Gastroenterology) Dorna Leitz, MD as Consulting Physician (Orthopedic Surgery) Josue Hector, MD as Consulting Physician (Cardiology) Irene Limbo, MD as Consulting Physician (Plastic Surgery) OTHER MD:    CHIEF COMPLAINT: Triple negative breast cancer, locally recurrent (s/p bilateral mastectomies)  CURRENT TREATMENT: adjuvant capecitabine;   INTERVAL HISTORY: Alexandria Bradley returns today for follow-up and treatment of her locally recurrent triple negative breast cancer.   She recently completed Pembrolizumab on 12/23/2021and has continued on Capecitabine 2 weeks on and one week off.     REVIEW OF SYSTEMS: C  BREAST CANCER HISTORY: From the original intake note of 02/16/2014:  [name] palpated a mass in her left breast early May and brought it immediately to her gynecologist attention. He set her up for bilateral diagnostic mammography and left ultrasonography at [facility] 02/07/2014. Mammography showed a 1.3 cm oval mass with spiculated margins in the left breast at the 11:00 position. This was palpable. Ultrasound showed a 1.1 cm tall her than wide mass with a microlobulated margins. He was hypoechoic. There were no abnormalities noted in the left axilla.  On 02/08/2014 the patient underwent biopsy of the left breast mass, showing (SAA 15-07/11/2005) invasive ductal carcinoma, grade 2 (but described as grade  3 by Dr. Lyndon Code at conference 02/16/2014), triple negative, with an MIB-1 of 50%.  The patient's subsequent history is as detailed below   PAST MEDICAL HISTORY: Past Medical History:  Diagnosis Date  . Anxiety   . Arthritis   . Back pain   . Breast cancer ([place])    left  . Fatty liver   . GERD (gastroesophageal reflux disease)   . Headache(784.0)    PMH : migraines  . History of kidney stones   . Hypercholesterolemia   . Hypertension   . Hypothyroidism   . Kidney stones   . Osteoporosis   . Personal history of chemotherapy   . Personal history of radiation therapy   . PONV (postoperative nausea and vomiting)    difficult to wake up  . Pre-diabetes   . Psoriasis   . PVC's (premature ventricular contractions)   . Radiation 08/18/14-10/07/14   Left breast    PAST SURGICAL HISTORY: Past Surgical History:  Procedure Laterality Date  . BREAST BIOPSY Left 11/10/2018  . BREAST LUMPECTOMY Left 2015  . BREAST LUMPECTOMY WITH AXILLARY LYMPH NODE BIOPSY  6/15   left  . BREAST RECONSTRUCTION WITH PLACEMENT OF TISSUE EXPANDER AND ALLODERM Bilateral 04/12/2019   Procedure: BILATERAL BREAST RECONSTRUCTION WITH PLACEMENT OF TISSUE EXPANDERS; ALLODERM TO RIGHT CHEST;  Surgeon: Irene Limbo, MD;  Location: [place];  Service: Plastics;  Laterality: Bilateral;  . CARDIAC CATHETERIZATION    . CHOLECYSTECTOMY    . COLONOSCOPY N/A 06/27/2014   Procedure: COLONOSCOPY;  Surgeon: Lear Ng, MD;  Location: [facility] ENDOSCOPY;  Service: Endoscopy;  Laterality: N/A;  . LATISSIMUS FLAP TO BREAST Left 04/12/2019   Procedure: LEFT LATISSIMUS DORSI FLAP TO LEFT CHEST;  Surgeon: Irene Limbo, MD;  Location: [place];  Service: Plastics;  Laterality: Left;  .  LEFT HEART CATH AND CORONARY ANGIOGRAPHY N/A 07/24/2018   Procedure: LEFT HEART CATH AND CORONARY ANGIOGRAPHY;  Surgeon: Troy Sine, MD;  Location: [place] CV LAB;  Service: Cardiovascular;  Laterality: N/A;  . LIPOMA EXCISION    .  MASTECTOMY W/ SENTINEL NODE BIOPSY Bilateral 04/12/2019   Procedure: RIGHT BREAST RISK REDUCING MASTECTOMY; LEFT MASTECTOMY WITH LEFT AXILLARY SENTINEL LYMPH NODE BIOPSY WITH BLUE DYE INJECTION;  Surgeon: [name], MD;  Location: [place];  Service: General;  Laterality: Bilateral;  . MYOMECTOMY    . PORT-A-CATH REMOVAL N/A 02/03/2015   Procedure: REMOVAL PORT-A-CATH;  Surgeon: [name], MD;  Location: [place];  Service: General;  Laterality: N/A;  . PORTACATH PLACEMENT N/A 03/01/2014   Procedure: INSERTION PORT-A-CATH;  Surgeon: [name], MD;  Location: [place];  Service: General;  Laterality: N/A;  . PORTACATH PLACEMENT N/A 11/18/2018   Procedure: INSERTION PORT-A-CATH WITH ULTRASOUND;  Surgeon: [name], MD;  Location: [place];  Service: General;  Laterality: N/A;  . REMOVAL OF TISSUE EXPANDER AND PLACEMENT OF IMPLANT Right 06/02/2019   Procedure: REMOVAL OF TISSUE EXPANDER RIGHT CHEST;  Surgeon: Irene Limbo, MD;  Location: [place];  Service: Plastics;  Laterality: Right;  . TONSILLECTOMY    . TUBAL LIGATION    . WOUND DEBRIDEMENT Left 06/02/2019   Procedure: LEFT CHEST DEBRIDEMENT;  Surgeon: Irene Limbo, MD;  Location: [place];  Service: Plastics;  Laterality: Left;    FAMILY HISTORY: Family History  Problem Relation Age of Onset  . Endometrial cancer Mother 5  . Hearing loss Mother   . Atrial fibrillation Mother   . Lung cancer Father   . Brain cancer Father   . Heart disease Maternal Aunt   . Atrial fibrillation Maternal Aunt   . Lung cancer Maternal Uncle   . Atrial fibrillation Maternal Uncle   . Stroke Maternal Grandmother   . Stroke Maternal Grandfather   . Bladder Cancer Maternal Uncle   . Multiple myeloma Maternal Uncle   . Other Son        trisomy 59   The patient's father died at the age of 6 from what may have been metastatic lung cancer. The patient knows  very little about the father's side of her family. Her mother is alive at age 52. She was recently diagnosed with endometrial cancer. The patient has one brother, 2 sisters. There is no history of breast or ovarian cancer in the family.   GYNECOLOGIC HISTORY:  Menarche age 57, first live birth age 80. The patient is GX P3. One child died shortly after birth. She stopped having periods approximately 2005. She did not use hormone replacement. 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 Son [name] is  Nurse, adult in [place]. Daughter [name] lives in [place] and works for [employer] as an Therapist, sports.  The patient has three grandchildren.    ADVANCED DIRECTIVES: Not in place   HEALTH MAINTENANCE: Social History   Tobacco Use  . Smoking status: Former Smoker    Packs/day: 0.50    Years: 15.00    Pack years: 7.50    Types: Cigarettes  . Smokeless tobacco: Never Used  . Tobacco comment: Quit smoking cigarettes  in 2002  Substance Use Topics  . Alcohol use: Yes    Comment: social  . Drug use: No     Colonoscopy: 06/27/2014, normal, [name]  PAP: 2013  Bone density: 04/10/2015, -1.1 ([facility])  Lipid panel:  Allergies  Allergen Reactions  . Chlorhexidine Itching  . Ciprofloxacin Anxiety, Rash and Other (See Comments)    GI upset  . Prednisone Shortness Of Breath and Other (See Comments)    "jacks me up" jittery   . Codeine Nausea Only  . Cortisone Swelling and Other (See Comments)    "jack me up"     Current Outpatient Medications  Medication Sig Dispense Refill  . augmented betamethasone dipropionate (DIPROLENE-AF) 0.05 % cream Apply 1 application topically 2 (two) times daily as needed.    . calcium  carbonate (TUMS - DOSED IN MG ELEMENTAL CALCIUM) 500 MG chewable tablet Chew 1 tablet by mouth at bedtime as needed for indigestion or heartburn.    . capecitabine (XELODA) 500 MG tablet Take 3 tablets (1500 mg) by mouth twice daily, immediately after meals. Take for 14 days on, 7 days off, repeat every 21 days 84 tablet 7  . cholestyramine (QUESTRAN) 4 GM/DOSE powder Take 1 packet (4 g total) by mouth 2 (two) times daily with a meal. 378 g 1  . diltiazem (CARDIZEM CD) 240 MG 24 hr capsule Take 1 capsule (240 mg total) by mouth daily. Please make overdue appt with Dr. Johnsie Cancel before anymore refills. 2nd attempt 15 capsule 0  . diphenoxylate-atropine (LOMOTIL) 2.5-0.025 MG tablet Take 1 tablet by mouth 2 (two) times daily as needed for diarrhea or loose stools. 30 tablet 0  . fluocinonide (LIDEX) 0.05 % external solution APPLY TO SCALP DAILY    . lidocaine-prilocaine (EMLA) cream APPLY TO AFFECTED AREA ONCE AS DIRECTED    . liothyronine (CYTOMEL) 5 MCG tablet Take 5 mcg by mouth daily before breakfast.     . omeprazole (PRILOSEC) 20 MG capsule Take 1 capsule (20 mg total) by mouth 2 (two) times daily as needed (for heartburn or acid reflux). 180 capsule 2  . ondansetron (ZOFRAN) 8 MG tablet Take 1 tablet (8 mg total) by mouth every 8 (eight) hours as needed for nausea or vomiting. 20 tablet 0  . potassium chloride (KLOR-CON) 10 MEQ tablet TAKE 1 TABLET (10 MEQ TOTAL) BY MOUTH 2 (TWO) TIMES DAILY. 180 tablet 1  . propranolol (INDERAL) 10 MG tablet Take 10 mg by mouth daily as needed (for heart palpitations).     Serita Grammes 125 MCG CAPS Take 1 capsule by mouth every morning.    . triamcinolone cream (KENALOG) 0.1 % APPLY TO AFFECTED AREA TWICE A DAY *MAX ON INSURANCE    . triamterene-hydrochlorothiazide (MAXZIDE-25) 37.5-25 MG tablet Take 1 tablet by mouth daily. 90 tablet 3   No current facility-administered medications for this visit.    OBJECTIVE: Vitals:   10/20/19 1457  BP: 138/65  Pulse: 80    Resp: 18  Temp: (!) 97.4 F (36.3 C)  SpO2: 97%   Wt Readings from Last 3 Encounters:  10/20/19 196 lb 9.6 oz (89.2 kg)  10/13/19 196 lb 9.6 oz (89.2 kg)  09/01/19 198 lb 8 oz (90 kg)   Body mass index is 32.72 kg/m.    ECOG FS:1 - Symptomatic but completely ambulatory GENERAL: Patient is a well appearing female in no acute distress HEENT:  Sclerae anicteric.  Mask in place. Neck is supple.  NODES:  No cervical, supraclavicular, or  axillary lymphadenopathy palpated.  BREAST EXAM:  Deferred. LUNGS:  Clear to auscultation bilaterally.  No wheezes or rhonchi. HEART:  Regular rate and rhythm. No murmur appreciated. ABDOMEN:  Soft.  Mild tenderness in lower abdomen. Positive, normoactive bowel sounds. No organomegaly palpated. MSK:  No focal spinal tenderness to palpation. Full range of motion bilaterally in the upper extremities. EXTREMITIES:  No peripheral edema.   SKIN:  Fading macular erythematous rash noted on arms. No nail dyscrasia. NEURO:  Nonfocal. Well oriented.  Appropriate affect.    LAB RESULTS: No results found for: SPEP  Lab Results  Component Value Date   WBC 8.6 10/20/2019   NEUTROABS 6.2 10/20/2019   HGB 12.9 10/20/2019   HCT 39.1 10/20/2019   MCV 104.8 (H) 10/20/2019   PLT 261 10/20/2019      Chemistry      Component Value Date/Time   NA 142 10/20/2019 1422   NA 140 06/19/2017 1312   K 3.8 10/20/2019 1422   K 3.5 06/19/2017 1312   CL 102 10/20/2019 1422   CO2 26 10/20/2019 1422   CO2 25 06/19/2017 1312   BUN 10 10/20/2019 1422   BUN 18.4 06/19/2017 1312   CREATININE 0.90 10/20/2019 1422   CREATININE 0.79 06/09/2019 1326   CREATININE 0.8 06/19/2017 1312      Component Value Date/Time   CALCIUM 10.0 10/20/2019 1422   CALCIUM 10.2 06/19/2017 1312   ALKPHOS 89 10/20/2019 1422   ALKPHOS 86 06/19/2017 1312   AST 57 (H) 10/20/2019 1422   AST 69 (H) 06/09/2019 1326   AST 15 06/19/2017 1312   ALT 34 10/20/2019 1422   ALT 40 06/09/2019 1326    ALT 22 06/19/2017 1312   BILITOT 0.5 10/20/2019 1422   BILITOT 0.3 06/09/2019 1326   BILITOT 0.26 06/19/2017 1312       No results found for: LABCA2  No components found for: LABCA125  No results for input(s): INR in the last 168 hours.  Urinalysis    Component Value Date/Time   COLORURINE YELLOW 03/13/2009 2251   APPEARANCEUR CLOUDY (A) 03/13/2009 2251   LABSPEC 1.020 06/15/2014 1101   PHURINE 6.0 06/15/2014 1101   PHURINE 5.5 03/13/2009 2251   GLUCOSEU Negative 06/15/2014 1101   HGBUR Negative 06/15/2014 1101   HGBUR NEGATIVE 03/13/2009 2251   BILIRUBINUR Color Interference 06/15/2014 1101   KETONESUR Negative 06/15/2014 1101   KETONESUR NEGATIVE 03/13/2009 2251   PROTEINUR 30 06/15/2014 1101   PROTEINUR NEGATIVE 03/13/2009 2251   UROBILINOGEN 0.2 06/15/2014 1101   NITRITE Negative 06/15/2014 1101   NITRITE NEGATIVE 03/13/2009 2251   LEUKOCYTESUR Trace 06/15/2014 1101    STUDIES: DG FEMUR, MIN 2 VIEWS RIGHT  Result Date: 10/13/2019 CLINICAL DATA:  60 year old female with right femoral pain. EXAM: RIGHT FEMUR 2 VIEWS COMPARISON:  Pelvic radiograph dated 12/24/2018. FINDINGS: There is no evidence of fracture or other focal bone lesions. Soft tissues are unremarkable. IMPRESSION: Negative. Electronically Signed   By: Anner Crete M.D.   On: 10/13/2019 15:44    ASSESSMENT: 59 y.o. [place] woman status post left breast upper inner quadrant biopsy 02/08/2014 for a clinical T1c N0, stage IA invasive ductal carcinoma, grade 3, triple negative, with an MIB-1 of 50%.  (1) status post left lumpectomy and sentinel lymph node sampling 03/01/2014 for a pT1c pN0, stage IA invasive ductal carcinoma, grade 3, with negative margins, repeat prognostic panel again triple negative.  (2) adjuvant chemotherapy started a 03/25/2014 consisting of cyclophosphamide and doxorubicin given in dose  dense fashion x4, completed 05/06/2014; followed by weekly paclitaxel x1, on 05/27/2014, poorly  tolerated;   (a) switched to Abraxane starting 06/10/2014, with 11 Abraxane doses planned  (b) dose reduced by 20% because of neutropenia starting 07/01/2014 dose  (c) discontinued after 4th Abraxane dose 07/15/2014 due to neuropathy  (3) adjuvant radiation completed 10/07/2014.  Left breast with breath hold technique/ 45 Gy at 1.8 Gy per fraction x 25 fractions.   Left breast boost/ 16 Gy at 2 Gy per fraction x 8 fractions  (4) genetics counseling 07/06/2015 through the Breast/Ovarian cancer gene panel offered by [facility] found no deleterious mutations in ATM, BARD1, BRCA1, BRCA2, BRIP1, CDH1, CHEK2, EPCAM, FANCC, MLH1, MSH2, MSH6, NBN, PALB2, PMS2, PTEN, RAD51C, RAD51D, TP53, and XRCC2.  (5) question of sleep apnea  (6) transverse colon lesion noted on CT scan from 06/17/2014 - negative biopsy, also showed fatty liver  (a) repeat CT scan of the abdomen and pelvis with contrast 02/15/2016 negative  RECURRENT DISEASE: February 2020 (7) biopsy of palpable mass upper inner left breast 11/10/2018 confirms invasive ductal carcinoma, grade 2 or 3, triple negative, with an MIB-1 of 80%.  (a) CT scans of the chest abdomen and pelvis with contrast showed no stage IV disease  (b) bone scan 11/13/2018 shows no definite metastasis to bone  (8) neoadjuvant chemotherapy consisting of carboplatin and gemcitabine given days 1 and 8 of each 21-day cycle, for 6 cycles, started 11/26/2018, completed 03/18/2019  (a) repeat ultrasound 01/25/2019 (after 3 cycles) shows no change in measurable breast mass   (9) bilateral mastectomies as follows   (a) left mastectomy and sentinel lymph node sampling with immediate tissue expander placement and latissimus flap 04/12/2019 showed a residual ypT1a ypN0 invasive ductal carcinoma, grade 3, with negative margins; a single sentinel lymph node was removed.  Margins were negative  (b) right mastectomy with immediate implant placement showed no malignancy  (c) right  expander removed 06/02/2019 secondary to infectious complications  (10) molecular testing on the 11/10/2018 biopsy showed negative PD-L1, negative androgen receptor, stable MSI and proficient mismatch repair status.  The tumor mutational burden was low.  No PIK3 mutation was detected.  There was a pathogenic TP53 variant and negative ER PR and HER-2 were confirmed.  (11) Adjuvant pembrolizumab given every 3 weeks beginning 06/09/2019, last dose 09/22/2019  (12) Adjuvant Capecitabine 1565m PO BID for 14 days on and 7 days off on a 21 day cycle starting 06/30/2019, to be continued through March 2021.   PLAN: CJaxyn The above was reviewed with Dr. MJana Hakimin detail who is in agreement with the plan and helped formulate it.    A total of (20) minutes of face-to-face time was spent with this patient with greater than 50% of that time in counseling and care-coordination.   LWilber Bihari NP 10/20/19 3:20 PM Medical Oncology and Hematology [facility] [id number]. [phone]   Fax. [phone]

## 2019-10-20 NOTE — Progress Notes (Signed)
Alexandria Bradley  [phone] [phone]    ID: [name] OB: 1961/02/23  MR#: [id number]  JLL#:[id number]  Patient Care Team: [name], DO as PCP - General (Family Medicine) Josue Hector, MD as PCP - Cardiology (Cardiology) Magrinat, Virgie Dad, MD as Consulting Physician (Oncology) Brien Few, MD as Consulting Physician (Obstetrics and Gynecology) [name], MD as Consulting Physician (General Surgery) Delrae Rend, MD as Consulting Physician (Endocrinology) Wilford Corner, MD as Consulting Physician (Gastroenterology) Dorna Leitz, MD as Consulting Physician (Orthopedic Surgery) Josue Hector, MD as Consulting Physician (Cardiology) Irene Limbo, MD as Consulting Physician (Plastic Surgery) OTHER MD:    CHIEF COMPLAINT: Triple negative breast cancer, locally recurrent (s/p bilateral mastectomies)  CURRENT TREATMENT: adjuvant capecitabine;   INTERVAL HISTORY: Alexandria Bradley returns today for follow-up and treatment of her locally recurrent triple negative breast cancer.   She has been taking Capecitabine 1592m two weeks on and one week off.  She has been off for two weeks due to her diarrhea.    REVIEW OF SYSTEMS: CHalienotes that her diarrhea has improved.  She stopped the anti diarrheals, and has had two episodes of watery stool per day.  She denies any blood, mucous, or pus.  She has had some stomach cramping, and she felt like the anti diarrheals had made it worse.  So she stopped them.  She denies any recent fever or chills.    CTonishadenies nausea, vomiting, chest pain, palpitations, cough, shortness of breath, headaches, vision issues.  She does note that she has had two skin cancers removed by dermatology.  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  Otherwise, a detailed ROS was non contributory.   BREAST CANCER HISTORY: From the original intake note of 02/16/2014:  CHoyle Alexandria Bradley a  mass in her left breast early May and brought it immediately to her gynecologist attention. He [facility] her [facility] Mammography showed a 1.3 cm oval mass with spiculated margins in the left breast at the 11:00 position. This was palpable. Ultrasound showed a 1.1 cm tall her than wide mass with a microlobulated margins. He was hypoechoic. There were no abnormalities noted in the left axilla.  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 KLyndon Codeat conference 02/16/2014), triple negative, with an MIB-1 of 50%.  The patient's subsequent history is as detailed below   PAST MEDICAL HISTORY: Past Medical History:  Diagnosis Date  . Anxiety   . Arthritis   . Back pain   . Breast cancer ([facility]    left  . Fatty liver   . GERD (gastroesophageal reflux disease)   . Headache(784.0)    PMH : migraines  . History of kidney stones   . Hypercholesterolemia   . Hypertension   . Hypothyroidism   . Kidney stones   . Osteoporosis   . Personal history of chemotherapy   . Personal history of radiation therapy   . PONV (postoperative nausea and vomiting)    difficult to wake up  . Pre-diabetes   . Psoriasis   . PVC's (premature ventricular contractions)   . Radiation 08/18/14-10/07/14   Left breast    PAST SURGICAL HISTORY: Past Surgical History:  Procedure Laterality Date  . BREAST BIOPSY Left 11/10/2018  . BREAST LUMPECTOMY Left 2015  . BREAST LUMPECTOMY WITH AXILLARY  LYMPH NODE BIOPSY  6/15   left  . BREAST RECONSTRUCTION WITH PLACEMENT OF TISSUE EXPANDER AND ALLODERM Bilateral 04/12/2019   Procedure: BILATERAL BREAST RECONSTRUCTION WITH PLACEMENT OF TISSUE EXPANDERS; ALLODERM TO RIGHT CHEST;  Surgeon: Irene Limbo, MD;  Location: [place];  Service: Plastics;  Laterality: Bilateral;  . CARDIAC  CATHETERIZATION    . CHOLECYSTECTOMY    . COLONOSCOPY N/A 06/27/2014   Procedure: COLONOSCOPY;  Surgeon: Lear Ng, MD;  Location: [facility];  Service: Endoscopy;  Laterality: N/A;  . LATISSIMUS FLAP TO BREAST Left 04/12/2019   Procedure: LEFT LATISSIMUS DORSI FLAP TO LEFT CHEST;  Surgeon: Irene Limbo, MD;  Location: [place];  Service: Plastics;  Laterality: Left;  . LEFT HEART CATH AND CORONARY ANGIOGRAPHY N/A 07/24/2018   Procedure: LEFT HEART CATH AND CORONARY ANGIOGRAPHY;  Surgeon: Troy Sine, MD;  Location: [place] [facility];  Service: Cardiovascular;  Laterality: N/A;  . LIPOMA EXCISION    . MASTECTOMY W/ SENTINEL NODE BIOPSY Bilateral 04/12/2019   Procedure: RIGHT BREAST RISK REDUCING MASTECTOMY; LEFT MASTECTOMY WITH LEFT AXILLARY SENTINEL LYMPH NODE BIOPSY WITH BLUE DYE INJECTION;  Surgeon: [name], MD;  Location: [place];  Service: General;  Laterality: Bilateral;  . MYOMECTOMY    . PORT-A-CATH REMOVAL N/A 02/03/2015   Procedure: REMOVAL PORT-A-CATH;  Surgeon: [name], MD;  Location: [place];  Service: General;  Laterality: N/A;  . PORTACATH PLACEMENT N/A 03/01/2014   Procedure: INSERTION PORT-A-CATH;  Surgeon: [name], MD;  Location: [place];  Service: General;  Laterality: N/A;  . PORTACATH PLACEMENT N/A 11/18/2018   Procedure: INSERTION PORT-A-CATH WITH ULTRASOUND;  Surgeon: [name], MD;  Location: [place];  Service: General;  Laterality: N/A;  . REMOVAL OF TISSUE EXPANDER AND PLACEMENT OF IMPLANT Right 06/02/2019   Procedure: REMOVAL OF TISSUE EXPANDER RIGHT CHEST;  Surgeon: Irene Limbo, MD;  Location: [place];  Service: Plastics;  Laterality: Right;  . TONSILLECTOMY    . TUBAL LIGATION    . WOUND DEBRIDEMENT Left 06/02/2019   Procedure: LEFT CHEST DEBRIDEMENT;  Surgeon: Irene Limbo, MD;  Location: [place];  Service: Plastics;  Laterality: Left;     FAMILY HISTORY: Family History  Problem Relation Age of Onset  . Endometrial cancer Mother 37  . Hearing loss Mother   . Atrial fibrillation Mother   . Lung cancer Father   . Brain cancer Father   . Heart disease Maternal Aunt   . Atrial fibrillation Maternal Aunt   . Lung cancer Maternal Uncle   . Atrial fibrillation Maternal Uncle   . Stroke Maternal Grandmother   . Stroke Maternal Grandfather   . Bladder Cancer Maternal Uncle   . Multiple myeloma Maternal Uncle   . Other Son        trisomy 38   The patient's father died at the age of 15 from what may have been metastatic lung cancer. The patient knows very little about the father's side of her family. Her mother is alive at age 43. She was recently diagnosed with endometrial cancer. The patient has one brother, 2 sisters. There is no history of breast or ovarian cancer in the family.   GYNECOLOGIC HISTORY:  Menarche age 52, first live birth age 36. The patient is GX P3. One child died shortly after birth. She stopped having periods approximately 2005. She did not use hormone replacement. 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 Son [name] is  Nurse, adult in [place]. Daughter [name] lives in [place] and works for [employer] as an Therapist, sports.  The patient has three grandchildren.    ADVANCED DIRECTIVES: Not in place   HEALTH MAINTENANCE: Social History   Tobacco Use  . Smoking status: Former Smoker    Packs/day: 0.50    Years: 15.00    Pack years: 7.50    Types: Cigarettes  . Smokeless tobacco: Never Used  . Tobacco comment: Quit smoking cigarettes in 2002  Substance Use Topics  . Alcohol use: Yes    Comment: social  . Drug use: No      Colonoscopy: 06/27/2014, normal, [name]  PAP: 2013  Bone density: 04/10/2015, -1.1 ([facility])  Lipid panel:  Allergies  Allergen Reactions  . Chlorhexidine Itching  . Ciprofloxacin Anxiety, Rash and Other (See Comments)    GI upset  . Prednisone Shortness Of Breath and Other (See Comments)    "jacks me up" jittery   . Codeine Nausea Only  . Cortisone Swelling and Other (See Comments)    "jack me up"     Current Outpatient Medications  Medication Sig Dispense Refill  . augmented betamethasone dipropionate (DIPROLENE-AF) 0.05 % cream Apply 1 application topically 2 (two) times daily as needed.    . calcium carbonate (TUMS - DOSED IN MG ELEMENTAL CALCIUM) 500 MG chewable tablet Chew 1 tablet by mouth at bedtime as needed for indigestion or heartburn.    . capecitabine (XELODA) 500 MG tablet Take 3 tablets (1500 mg) by mouth twice daily, immediately after meals. Take for 14 days on, 7 days off, repeat every 21 days 84 tablet 7  . cholestyramine (QUESTRAN) 4 GM/DOSE powder Take 1 packet (4 g total) by mouth 2 (two) times daily with a meal. 378 g 1  . diltiazem (CARDIZEM CD) 240 MG 24 hr capsule Take 1 capsule (240 mg total) by mouth daily. Please make overdue appt with Dr. Johnsie Cancel before anymore refills. 2nd attempt 15 capsule 0  . diphenoxylate-atropine (LOMOTIL) 2.5-0.025 MG tablet Take 1 tablet by mouth 2 (two) times daily as needed for diarrhea or loose stools. 30 tablet 0  . fluocinonide (LIDEX) 0.05 % external solution APPLY TO SCALP DAILY    . lidocaine-prilocaine (EMLA) cream APPLY TO AFFECTED AREA ONCE AS DIRECTED    . liothyronine (CYTOMEL) 5 MCG tablet Take 5 mcg by mouth daily before breakfast.     . omeprazole (PRILOSEC) 20 MG capsule Take 1 capsule (20 mg total) by mouth 2 (two) times daily as needed (for heartburn or acid reflux). 180 capsule 2  . ondansetron (ZOFRAN) 8 MG tablet Take 1 tablet (8 mg total) by mouth every 8 (eight) hours as needed for nausea or  vomiting. 20 tablet 0  . potassium chloride (KLOR-CON) 10 MEQ tablet TAKE 1 TABLET (10 MEQ TOTAL) BY MOUTH 2 (TWO) TIMES DAILY. 180 tablet 1  . propranolol (INDERAL) 10 MG tablet Take 10 mg by mouth daily as needed (for heart palpitations).     Serita Grammes 125 MCG CAPS Take 1 capsule by mouth every morning.    . triamcinolone cream (KENALOG) 0.1 % APPLY TO AFFECTED AREA TWICE A DAY *MAX ON INSURANCE    . triamterene-hydrochlorothiazide (MAXZIDE-25) 37.5-25 MG tablet Take 1 tablet by  mouth daily. 90 tablet 3   No current facility-administered medications for this visit.    OBJECTIVE: Vitals:   10/20/19 1457  BP: 138/65  Pulse: 80  Resp: 18  Temp: (!) 97.4 F (36.3 C)  SpO2: 97%   Wt Readings from Last 3 Encounters:  10/20/19 196 lb 9.6 oz (89.2 kg)  10/13/19 196 lb 9.6 oz (89.2 kg)  09/01/19 198 lb 8 oz (90 kg)   Body mass index is 32.72 kg/m.    ECOG FS:1 - Symptomatic but completely ambulatory GENERAL: Patient is a well appearing female in no acute distress HEENT:  Sclerae anicteric.  Mask in place. Neck is supple.  NODES:  No cervical, supraclavicular, or axillary lymphadenopathy palpated.  BREAST EXAM:  Deferred. LUNGS:  Clear to auscultation bilaterally.  No wheezes or rhonchi. HEART:  Regular rate and rhythm. No murmur appreciated. ABDOMEN:  Soft.  Mild tenderness in lower abdomen.  Positive, normoactive bowel sounds. No organomegaly palpated. MSK:  No focal spinal tenderness to palpation.  EXTREMITIES:  No peripheral edema.   SKIN:  Fading macular erythematous rash noted on arms. No nail dyscrasia. NEURO:  Nonfocal. Well oriented.  Appropriate affect.    LAB RESULTS: No results found for: SPEP  Lab Results  Component Value Date   WBC 8.6 10/20/2019   NEUTROABS 6.2 10/20/2019   HGB 12.9 10/20/2019   HCT 39.1 10/20/2019   MCV 104.8 (H) 10/20/2019   PLT 261 10/20/2019      Chemistry      Component Value Date/Time   NA 142 10/20/2019 1422   NA 140  06/19/2017 1312   K 3.8 10/20/2019 1422   K 3.5 06/19/2017 1312   CL 102 10/20/2019 1422   CO2 26 10/20/2019 1422   CO2 25 06/19/2017 1312   BUN 10 10/20/2019 1422   BUN 18.4 06/19/2017 1312   CREATININE 0.90 10/20/2019 1422   CREATININE 0.79 06/09/2019 1326   CREATININE 0.8 06/19/2017 1312      Component Value Date/Time   CALCIUM 10.0 10/20/2019 1422   CALCIUM 10.2 06/19/2017 1312   ALKPHOS 89 10/20/2019 1422   ALKPHOS 86 06/19/2017 1312   AST 57 (H) 10/20/2019 1422   AST 69 (H) 06/09/2019 1326   AST 15 06/19/2017 1312   ALT 34 10/20/2019 1422   ALT 40 06/09/2019 1326   ALT 22 06/19/2017 1312   BILITOT 0.5 10/20/2019 1422   BILITOT 0.3 06/09/2019 1326   BILITOT 0.26 06/19/2017 1312       No results found for: LABCA2  No components found for: LABCA125  No results for input(s): INR in the last 168 hours.  Urinalysis    Component Value Date/Time   COLORURINE YELLOW 03/13/2009 2251   APPEARANCEUR CLOUDY (A) 03/13/2009 2251   LABSPEC 1.020 06/15/2014 1101   PHURINE 6.0 06/15/2014 1101   PHURINE 5.5 03/13/2009 2251   GLUCOSEU Negative 06/15/2014 1101   HGBUR Negative 06/15/2014 1101   HGBUR NEGATIVE 03/13/2009 2251   BILIRUBINUR Color Interference 06/15/2014 1101   KETONESUR Negative 06/15/2014 1101   KETONESUR NEGATIVE 03/13/2009 2251   PROTEINUR 30 06/15/2014 1101   PROTEINUR NEGATIVE 03/13/2009 2251   UROBILINOGEN 0.2 06/15/2014 1101   NITRITE Negative 06/15/2014 1101   NITRITE NEGATIVE 03/13/2009 2251   LEUKOCYTESUR Trace 06/15/2014 1101    STUDIES: DG FEMUR, MIN 2 VIEWS RIGHT  Result Date: 10/13/2019 CLINICAL DATA:  59 year old female with right femoral pain. EXAM: RIGHT FEMUR 2 VIEWS COMPARISON:  Pelvic radiograph dated 12/24/2018. FINDINGS:  There is no evidence of fracture or other focal bone lesions. Soft tissues are unremarkable. IMPRESSION: Negative. Electronically Signed   By: Anner Crete M.D.   On: 10/13/2019 15:44    ASSESSMENT: 59 y.o.  [place] woman status post left breast upper inner quadrant biopsy 02/08/2014 for a clinical T1c N0, stage IA invasive ductal carcinoma, grade 3, triple negative, with an MIB-1 of 50%.  (1) status post left lumpectomy and sentinel lymph node sampling 03/01/2014 for a pT1c pN0, stage IA invasive ductal carcinoma, grade 3, with negative margins, repeat prognostic panel again triple negative.  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  Left breast with breath hold technique/ 45 Gy at 1.8 Gy per fraction x 25 fractions.   Left breast boost/ 16 Gy at 2 Gy per fraction x 8 fractions  (4) genetics counseling 07/06/2015 through the Breast/Ovarian cancer gene panel offered by [facility] found no deleterious mutations in ATM, BARD1, BRCA1, BRCA2, BRIP1, CDH1, CHEK2, EPCAM, FANCC, MLH1, MSH2, MSH6, NBN, PALB2, PMS2, PTEN, RAD51C, RAD51D, TP53, and XRCC2.  (5) question of sleep apnea  (6) transverse colon lesion noted on CT scan from 06/17/2014 - negative biopsy, also showed fatty liver  (a) repeat CT scan of the abdomen and pelvis with contrast 02/15/2016 negative  RECURRENT DISEASE: February 2020 (7) biopsy of palpable mass upper inner left breast 11/10/2018 confirms invasive ductal carcinoma, grade 2 or 3, triple negative, with an MIB-1 of 80%.  (a) CT scans of the chest abdomen and pelvis with contrast showed no stage IV disease  (b) bone scan 11/13/2018 shows no definite metastasis to bone  (8) neoadjuvant chemotherapy consisting of carboplatin and gemcitabine  given days 1 and 8 of each 21-day cycle, for 6 cycles, started 11/26/2018, completed 03/18/2019  (a) repeat ultrasound 01/25/2019 (after 3 cycles) shows no change in measurable breast mass   (9) bilateral mastectomies as follows   (a) left mastectomy and sentinel lymph node sampling with immediate tissue expander placement and latissimus flap 04/12/2019 showed a residual ypT1a ypN0 invasive ductal carcinoma, grade 3, with negative margins; a single sentinel lymph node was removed.  Margins were negative  (b) right mastectomy with immediate implant placement showed no malignancy  (c) right expander removed 06/02/2019 secondary to infectious complications  (10) molecular testing on the 11/10/2018 biopsy showed negative PD-L1, negative androgen receptor, stable MSI and proficient mismatch repair status.  The tumor mutational burden was low.  No PIK3 mutation was detected.  There was a pathogenic TP53 variant and negative ER PR and HER-2 were confirmed.  (11) Adjuvant pembrolizumab given every 3 weeks beginning 06/09/2019, last dose 09/22/2019  (12) Adjuvant Capecitabine 1549m PO BID for 14 days on and 7 days off on a 21 day cycle starting 06/30/2019, to be continued through 10/2019 (stopped early due to diarrhea).   PLAN:  CKelseyis doing moderately well today.  Her labs are stable.  She has continued to have diarrhea, and though improved, it is still present.  I reviewed her persistent diarrhea with Dr. MJana Hakim  After discussion with him, he recommended to discontinue the Capecitabine therapy.  He would like to  see Ellinor back in 4 weeks.    I reviewed the above plan with Cletis in detail, who understands it.  We will see her back in 4 weeks.  I requested her appointments be updated.  She has port removal scheduled in 12/2019, and I adjusted flush appointments for her.  She was recommended to continue with the appropriate pandemic precautions. She knows to call for any questions that may arise  between now and her next appointment.  We are happy to see her sooner if needed.  Total time this encounter: 20 minutes.    Wilber Bihari, NP 10/20/19 3:10 PM Medical Oncology and Hematology [facility] [place], [place] [address] Tel. [phone]    Fax. [phone]

## 2019-10-20 NOTE — Telephone Encounter (Signed)
No entry 

## 2019-10-21 ENCOUNTER — Telehealth: Payer: Self-pay | Admitting: Adult Health

## 2019-10-21 NOTE — Telephone Encounter (Signed)
I left a message regarding schedule  

## 2019-10-26 ENCOUNTER — Telehealth: Payer: Self-pay | Admitting: *Deleted

## 2019-10-26 NOTE — Telephone Encounter (Signed)
This RN spoke with pt per her call inquiring about use of a probiotic for help with ongoing diarrhea since stopping the xeloda.  Alexandria Bradley states the diarrhea " comes and goes " with some days having up to 7 stools.  Consistency can be soft to watery she feels depending on food intake.  She states the lomotil works well to control but then diarrhea will resume eventually. She is hoping the probiotic will help her resume normal bowel habits.  Noted pt has been off the xeloda x 2 weeks.  This RN informed pt use of a probiotic is ok to use- but if she does not notice improvement to let us know for further work up.  Alexandria Bradley verbalized understanding.

## 2019-10-29 ENCOUNTER — Other Ambulatory Visit: Payer: Self-pay | Admitting: Oncology

## 2019-11-03 ENCOUNTER — Ambulatory Visit: Payer: 59 | Admitting: Adult Health

## 2019-11-03 ENCOUNTER — Other Ambulatory Visit: Payer: 59

## 2019-11-06 ENCOUNTER — Other Ambulatory Visit: Payer: Self-pay | Admitting: Nurse Practitioner

## 2019-11-12 ENCOUNTER — Other Ambulatory Visit: Payer: Self-pay | Admitting: Nurse Practitioner

## 2019-11-12 MED ORDER — DILTIAZEM HCL ER COATED BEADS 240 MG PO CP24: 240.0000 mg | ORAL_CAPSULE | Freq: Every day | ORAL | 0 refills | Status: DC

## 2019-11-12 NOTE — Telephone Encounter (Signed)
Pt's medication was sent to pt's pharmacy as requested. Confirmation received.  °

## 2019-11-14 NOTE — Progress Notes (Signed)
Virtual Visit via Video Note   This visit type was conducted due to national recommendations for restrictions regarding the COVID-19 Pandemic (e.g. social distancing) in an effort to limit this patient's exposure and mitigate transmission in our community.  Due to her co-morbid illnesses, this patient is at least at moderate risk for complications without adequate follow up.  This format is felt to be most appropriate for this patient at this time.  All issues noted in this document were discussed and addressed.  A limited physical exam was performed with this format.  Please refer to the patient's chart for her consent to telehealth for [facility].   Patient location: Home Physician location: Office   Date:  11/19/2019    Alexandria Bradley Date of Birth: 1961/06/23 Medical Record #[id number]  PCP:  [name], [place]  Cardiologist:  Johnsie Cancel     History of Present Illness: Alexandria Bradley is a 59 y.o. female history of palpitations, CKD, HTN, DM and HLD , Hypothyroidism Seen mostly by PA;s/NP's over last few years. Fatigue with Toprol. Had cath 07/24/18 for chest pain No CAD normal EF and EDP. Echo done 07/19/18 normal EF 55-60% some note of RV function being reduced Event monitor reviewed 07/28/18 NSR isolated PVCls but they did correspond to symptoms of heart fluttering Currently on cardizem 240 mg daily and has had inderal PRN in past   Had surgery for recurrent breast cancer in July with some reconstruction in September  Now off xeloda and Keytruda Gave her Floyde Parkins name in case Dr Jana Hakim retires  Palpitations persist but not really an issue for her Compliant with cardizem   .   Past Medical History:  Diagnosis Date  . Anxiety   . Arthritis   . Back pain   . Breast cancer ([place])    left  . Fatty liver   . GERD (gastroesophageal reflux disease)   . Headache(784.0)    PMH : migraines  . History of kidney stones   . Hypercholesterolemia    . Hypertension   . Hypothyroidism   . Kidney stones   . Osteoporosis   . Personal history of chemotherapy   . Personal history of radiation therapy   . PONV (postoperative nausea and vomiting)    difficult to wake up  . Pre-diabetes   . Psoriasis   . PVC's (premature ventricular contractions)   . Radiation 08/18/14-10/07/14   Left breast    Past Surgical History:  Procedure Laterality Date  . BREAST BIOPSY Left 11/10/2018  . BREAST LUMPECTOMY Left 2015  . BREAST LUMPECTOMY WITH AXILLARY LYMPH NODE BIOPSY  6/15   left  . BREAST RECONSTRUCTION WITH PLACEMENT OF TISSUE EXPANDER AND ALLODERM Bilateral 04/12/2019   Procedure: BILATERAL BREAST RECONSTRUCTION WITH PLACEMENT OF TISSUE EXPANDERS; ALLODERM TO RIGHT CHEST;  Surgeon: Irene Limbo, MD;  Location: [place];  Service: Plastics;  Laterality: Bilateral;  . CARDIAC CATHETERIZATION    . CHOLECYSTECTOMY    . COLONOSCOPY N/A 06/27/2014   Procedure: COLONOSCOPY;  Surgeon: Lear Ng, MD;  Location: [facility] ENDOSCOPY;  Service: Endoscopy;  Laterality: N/A;  . LATISSIMUS FLAP TO BREAST Left 04/12/2019   Procedure: LEFT LATISSIMUS DORSI FLAP TO LEFT CHEST;  Surgeon: Irene Limbo, MD;  Location: [place];  Service: Plastics;  Laterality: Left;  . LEFT HEART CATH AND CORONARY ANGIOGRAPHY N/A 07/24/2018   Procedure: LEFT HEART CATH AND CORONARY ANGIOGRAPHY;  Surgeon: Troy Sine, MD;  Location: [place] CV  LAB;  Service: Cardiovascular;  Laterality: N/A;  . LIPOMA EXCISION    . MASTECTOMY W/ SENTINEL NODE BIOPSY Bilateral 04/12/2019   Procedure: RIGHT BREAST RISK REDUCING MASTECTOMY; LEFT MASTECTOMY WITH LEFT AXILLARY SENTINEL LYMPH NODE BIOPSY WITH BLUE DYE INJECTION;  Surgeon: [name], MD;  Location: [place];  Service: General;  Laterality: Bilateral;  . MYOMECTOMY    . PORT-A-CATH REMOVAL N/A 02/03/2015   Procedure: REMOVAL PORT-A-CATH;  Surgeon: [name], MD;  Location: [place];  Service:  General;  Laterality: N/A;  . PORTACATH PLACEMENT N/A 03/01/2014   Procedure: INSERTION PORT-A-CATH;  Surgeon: [name], MD;  Location: [place];  Service: General;  Laterality: N/A;  . PORTACATH PLACEMENT N/A 11/18/2018   Procedure: INSERTION PORT-A-CATH WITH ULTRASOUND;  Surgeon: [name], MD;  Location: [place];  Service: General;  Laterality: N/A;  . REMOVAL OF TISSUE EXPANDER AND PLACEMENT OF IMPLANT Right 06/02/2019   Procedure: REMOVAL OF TISSUE EXPANDER RIGHT CHEST;  Surgeon: Irene Limbo, MD;  Location: [place];  Service: Plastics;  Laterality: Right;  . TONSILLECTOMY    . TUBAL LIGATION    . WOUND DEBRIDEMENT Left 06/02/2019   Procedure: LEFT CHEST DEBRIDEMENT;  Surgeon: Irene Limbo, MD;  Location: [place];  Service: Plastics;  Laterality: Left;     Medications: Current Meds  Medication Sig  . diltiazem (CARDIZEM CD) 240 MG 24 hr capsule Take 1 capsule (240 mg total) by mouth daily. Please keep upcoming appt in February with Dr. Johnsie Cancel before anymore refills. Thank you  . potassium chloride (KLOR-CON) 10 MEQ tablet TAKE 1 TABLET (10 MEQ TOTAL) BY MOUTH 2 (TWO) TIMES DAILY.  Marland Kitchen propranolol (INDERAL) 10 MG tablet Take 10 mg by mouth daily as needed (for heart palpitations).   . triamterene-hydrochlorothiazide (MAXZIDE-25) 37.5-25 MG tablet Take 1 tablet by mouth daily.     Allergies: Allergies  Allergen Reactions  . Chlorhexidine Itching  . Ciprofloxacin Anxiety, Rash and Other (See Comments)    GI upset  . Prednisone Shortness Of Breath and Other (See Comments)    "jacks me up" jittery   . Codeine Nausea Only  . Cortisone Swelling and Other (See Comments)    "jack me up"     Social History: The patient  reports that she has quit smoking. Her smoking use included cigarettes. She has a 7.50 pack-year smoking history. She has never used smokeless tobacco. She reports current alcohol use. She reports  that she does not use drugs.   Family History: The patient's family history includes Atrial fibrillation in her maternal aunt, maternal uncle, and mother; Bladder Cancer in her maternal uncle; Brain cancer in her father; Endometrial cancer (age of onset: 55) in her mother; Hearing loss in her mother; Heart disease in her maternal aunt; Lung cancer in her father and maternal uncle; Multiple myeloma in her maternal uncle; Other in her son; Stroke in her maternal grandfather and maternal grandmother.   Review of Systems: Please see the history of present illness.   Otherwise, the review of systems is positive for none.   All other systems are reviewed and negative.   Physical Exam: VS:  BP 128/79   Pulse 68   Ht 5' 5"  (1.651 m)   Wt 190 lb (86.2 kg)   BMI 31.62 kg/m  .  BMI Body mass index is 31.62 kg/m.  Telephone no exam    LABORATORY DATA:  EKG:   08/06/18 SR rate 77 normal ECG  Lab Results  Component Value Date   WBC 8.6 10/20/2019   HGB 12.9 10/20/2019   HCT 39.1 10/20/2019   PLT 261 10/20/2019   GLUCOSE 113 (H) 10/20/2019   CHOL 243 (HH) 01/12/2008   TRIG 198 (H) 01/12/2008   HDL 38.4 (L) 01/12/2008   LDLDIRECT 175.2 01/12/2008   ALT 34 10/20/2019   AST 57 (H) 10/20/2019   NA 142 10/20/2019   K 3.8 10/20/2019   CL 102 10/20/2019   CREATININE 0.90 10/20/2019   BUN 10 10/20/2019   CO2 26 10/20/2019   TSH 6.373 (H) 10/13/2019   HGBA1C 5.0 04/06/2019       Other Studies Reviewed Today:  Event Monitor 06/2018 Study Highlights   NSR Isolated PVCls  No significant NSVT Subjective feeling of skips and fluttering does usually  Correlate with PVCls      Cardiac cath 07/25/18 Normal coronary arteries in a dominant RCA system.  LVEDP 10 mm Hg.  RECOMMENDATION: Suspect noncardiac etiology to the patient's chest tightness. Consider echo Doppler study to assess LV function.  No indication for antiplatelet therapy at this time.  Echo 07/28/18 Study  Conclusions  - Left ventricle: The cavity size was normal. Wall thickness was normal. Systolic function was normal. The estimated ejection fraction was in the range of 55% to 60%. Wall motion was normal; there were no regional wall motion abnormalities. Doppler parameters are consistent with abnormal left ventricular relaxation (grade 1 diastolic dysfunction). - Right ventricle: Systolic function was mildly to moderately reduced.    Assessment/Plan:  1.  Palpitations - benign continue cardizem fatigue with Toprol    2. HTN - Discussed diet and exercise continue CCB  3. Hypothyroid: TSH 6.3 10/13/19 f/u primary on replacement with Cytomel Sees [facility]   4. Breast Cancer:  Sees Magrinat had chemo/ surgery July 2020 and reconstruction September More surgery with expander on right in April    Current medicines are reviewed with the patient today.  The patient does not have concerns regarding medicines other than what has been noted above.  The following changes have been made:  See above.  Labs/ tests ordered today include:   No orders of the defined types were placed in this encounter.  Time:  Spent reviewing above studies notes from NP/PA direct interviewing patient and writing note 20 minutes   Disposition:   FU with PA/NP in a year   Patient is agreeable to this plan and will call if any problems develop in the interim.   Signed: Jenkins Rouge, MD  11/19/2019 8:45 AM  [facility] [address] [place], [place]  [address] Phone: [phone] Fax: [phone]

## 2019-11-15 ENCOUNTER — Encounter: Payer: Self-pay | Admitting: *Deleted

## 2019-11-18 ENCOUNTER — Telehealth: Payer: Self-pay

## 2019-11-18 NOTE — Telephone Encounter (Signed)

## 2019-11-19 ENCOUNTER — Telehealth (INDEPENDENT_AMBULATORY_CARE_PROVIDER_SITE_OTHER): Payer: 59 | Admitting: Cardiovascular Disease

## 2019-11-19 ENCOUNTER — Other Ambulatory Visit: Payer: Self-pay | Admitting: Cardiovascular Disease

## 2019-11-19 VITALS — BP 128/79 | HR 68 | Ht 65.0 in | Wt 190.0 lb

## 2019-11-19 DIAGNOSIS — R002 Palpitations: Secondary | ICD-10-CM | POA: Diagnosis not present

## 2019-11-19 NOTE — Patient Instructions (Addendum)
Medication Instructions:   *If you need a refill on your cardiac medications before your next appointment, please call your pharmacy*  Lab Work:  If you have labs (blood work) drawn today and your tests are completely normal, you will receive your results only by: Marland Kitchen MyChart Message (if you have MyChart) OR . A paper copy in the mail If you have any lab test that is abnormal or we need to change your treatment, we will call you to review the results.  Testing/Procedures: None ordered at this time.  Follow-Up: At [facility], you and your health needs are our priority.  As part of our continuing mission to provide you with exceptional heart care, we have created designated Provider Care Teams.  These Care Teams include your primary Cardiologist (physician) and Advanced Practice Providers (APPs -  Physician Assistants and Nurse Practitioners) who all work together to provide you with the care you need, when you need it.  Your next appointment:   12 month(s)  The format for your next appointment:   In Person  Provider:   You may see Jenkins Rouge, MD or one of the following Advanced Practice Providers on your designated Care Team:    Truitt Merle, NP  Cecilie Kicks, NP  Kathyrn Drown, NP

## 2019-11-23 NOTE — Progress Notes (Signed)
[place]  [phone] Fax:([phone]    ID: [name] OB: 1960/11/26  MR#: [id number]  GDJ#:[id number]  Patient Care Team: [name], DO as PCP - General (Family Medicine) Josue Hector, MD as PCP - Cardiology (Cardiology) Carollyn Etcheverry, Virgie Dad, MD as Consulting Physician (Oncology) Brien Few, MD as Consulting Physician (Obstetrics and Gynecology) [name], MD as Consulting Physician (General Surgery) Delrae Rend, MD as Consulting Physician (Endocrinology) Wilford Corner, MD as Consulting Physician (Gastroenterology) Dorna Leitz, MD as Consulting Physician (Orthopedic Surgery) Josue Hector, MD as Consulting Physician (Cardiology) Irene Limbo, MD as Consulting Physician (Plastic Surgery) OTHER MD:    CHIEF COMPLAINT: Triple negative breast cancer, locally recurrent (s/p bilateral mastectomies)  CURRENT TREATMENT: Observation   INTERVAL HISTORY: Alexandria Bradley returns today for follow-up of her locally recurrent triple negative breast cancer.   Her Capecitabine was discontinued due to her persistent diarrhea.  However she still has diarrhea on and off.  She uses Imodium between 2 and 4 times most days.  She is no longer using Lomotil.  She almost never uses Questran although she says the Questran did help with the bloating feeling.  At her last visit on 10/13/2019, she reported right femoral pain. Following her appointment, she proceeded to x-ray, which was negative.  She is scheduled for right breast breast Spender replacement and port removalon 01/24/2020 under Dr. Iran Planas.   REVIEW OF SYSTEMS: Alexandria Bradley has recovered fairly well from her treatments.  The problems she is having now are problems she had before namely a psoriatic rash which is much less itchy than it used to be, problems with irritable bowel.  She is due for colonoscopy but has been putting it off.  I reassured her that that would not have  anything to do with her upcoming surgery and she certainly can have it done before the end of April if she decides to [name] has an opening.  A detailed review of systems today was otherwise stable.   BREAST CANCER HISTORY: From the original intake note of 02/16/2014:  Alexandria Bradley palpated a mass in her left breast early May and brought it immediately to her gynecologist attention. He set her up for bilateral diagnostic mammography and left ultrasonography at [facility] 02/07/2014. Mammography showed a 1.3 cm oval mass with spiculated margins in the left breast at the 11:00 position. This was palpable. Ultrasound showed a 1.1 cm tall her than wide mass with a microlobulated margins. He was hypoechoic. There were no abnormalities noted in the left axilla.  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 Lyndon Code at conference 02/16/2014), triple negative, with an MIB-1 of 50%.  The patient's subsequent history is as detailed below   PAST MEDICAL HISTORY: Past Medical History:  Diagnosis Date  . Anxiety   . Arthritis   . Back pain   . Breast cancer ([place])    left  . Fatty liver   . GERD (gastroesophageal reflux disease)   . Headache(784.0)    PMH : migraines  . History of kidney stones   . Hypercholesterolemia   . Hypertension   . Hypothyroidism   . Kidney stones   . Osteoporosis   . Personal history of chemotherapy   . Personal history of radiation therapy   . PONV (postoperative nausea and vomiting)    difficult to wake up  . Pre-diabetes   . Psoriasis   .  PVC's (premature ventricular contractions)   . Radiation 08/18/14-10/07/14   Left breast    PAST SURGICAL HISTORY: Past Surgical History:  Procedure Laterality Date  . BREAST BIOPSY Left 11/10/2018  . BREAST LUMPECTOMY Left 2015  . BREAST LUMPECTOMY WITH AXILLARY LYMPH NODE BIOPSY  6/15   left  . BREAST  RECONSTRUCTION WITH PLACEMENT OF TISSUE EXPANDER AND ALLODERM Bilateral 04/12/2019   Procedure: BILATERAL BREAST RECONSTRUCTION WITH PLACEMENT OF TISSUE EXPANDERS; ALLODERM TO RIGHT CHEST;  Surgeon: Irene Limbo, MD;  Location: [place];  Service: Plastics;  Laterality: Bilateral;  . CARDIAC CATHETERIZATION    . CHOLECYSTECTOMY    . COLONOSCOPY N/A 06/27/2014   Procedure: COLONOSCOPY;  Surgeon: Lear Ng, MD;  Location[facility]COPY;  Service: Endoscopy;  Laterality: N/A;  . LATISSIMUS FLAP TO BREAST Left 04/12/2019   Procedure: LEFT LATISSIMUS DORSI FLAP TO LEFT CHEST;  Surgeon: Irene Limbo, MD;  Location: [place];  Service: Plastics;  Laterality: Left;  . LEFT HEART CATH AND CORONARY ANGIOGRAPHY N/A 07/24/2018   Procedure: LEFT HEART CATH AND CORONARY ANGIOGRAPHY;  Surgeon: Troy Sine, MD;  Location: [place] CV LAB;  Service: Cardiovascular;  Laterality: N/A;  . LIPOMA EXCISION    . MASTECTOMY W/ SENTINEL NODE BIOPSY Bilateral 04/12/2019   Procedure: RIGHT BREAST RISK REDUCING MASTECTOMY; LEFT MASTECTOMY WITH LEFT AXILLARY SENTINEL LYMPH NODE BIOPSY WITH BLUE DYE INJECTION;  Surgeon: [name], MD;  Location: [place];  Service: General;  Laterality: Bilateral;  . MYOMECTOMY    . PORT-A-CATH REMOVAL N/A 02/03/2015   Procedure: REMOVAL PORT-A-CATH;  Surgeon: [name], MD;  Location: [place];  Service: General;  Laterality: N/A;  . PORTACATH PLACEMENT N/A 03/01/2014   Procedure: INSERTION PORT-A-CATH;  Surgeon: [name], MD;  Location: [place];  Service: General;  Laterality: N/A;  . PORTACATH PLACEMENT N/A 11/18/2018   Procedure: INSERTION PORT-A-CATH WITH ULTRASOUND;  Surgeon: [name], MD;  Location: [place];  Service: General;  Laterality: N/A;  . REMOVAL OF TISSUE EXPANDER AND PLACEMENT OF IMPLANT Right 06/02/2019   Procedure: REMOVAL OF TISSUE EXPANDER RIGHT CHEST;  Surgeon: Irene Limbo, MD;  Location:  [place];  Service: Plastics;  Laterality: Right;  . TONSILLECTOMY    . TUBAL LIGATION    . WOUND DEBRIDEMENT Left 06/02/2019   Procedure: LEFT CHEST DEBRIDEMENT;  Surgeon: Irene Limbo, MD;  Location: [place];  Service: Plastics;  Laterality: Left;    FAMILY HISTORY: Family History  Problem Relation Age of Onset  . Endometrial cancer Mother 24  . Hearing loss Mother   . Atrial fibrillation Mother   . Lung cancer Father   . Brain cancer Father   . Heart disease Maternal Aunt   . Atrial fibrillation Maternal Aunt   . Lung cancer Maternal Uncle   . Atrial fibrillation Maternal Uncle   . Stroke Maternal Grandmother   . Stroke Maternal Grandfather   . Bladder Cancer Maternal Uncle   . Multiple myeloma Maternal Uncle   . Other Son        trisomy 41   The patient's father died at the age of 55 from what may have been metastatic lung cancer. The patient knows very little about the father's side of her family. Her mother is alive at age 66. She was recently diagnosed with endometrial cancer. The patient has one brother, 2 sisters. There is no history of breast or ovarian cancer in the family.   GYNECOLOGIC HISTORY:  Menarche age 36, first live birth age 1. The patient is GX P3. One child died shortly after birth. She stopped having periods approximately 2005. She did not use hormone replacement. 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 Son [name] is  Nurse, adult in [place]. Daughter [name] lives in [place] and works for [employer] as an Therapist, sports.  The patient has three grandchildren.    ADVANCED DIRECTIVES: Not in place   HEALTH MAINTENANCE: Social History    Tobacco Use  . Smoking status: Former Smoker    Packs/day: 0.50    Years: 15.00    Pack years: 7.50    Types: Cigarettes  . Smokeless tobacco: Never Used  . Tobacco comment: Quit smoking cigarettes in 2002  Substance Use Topics  . Alcohol use: Yes    Comment: social  . Drug use: No     Colonoscopy: 06/27/2014, normal, [name]  PAP: 2013  Bone density: 04/10/2015, -1.1 ([facility])  Lipid panel:  Allergies  Allergen Reactions  . Chlorhexidine Itching  . Ciprofloxacin Anxiety, Rash and Other (See Comments)    GI upset  . Prednisone Shortness Of Breath and Other (See Comments)    "jacks me up" jittery   . Codeine Nausea Only  . Cortisone Swelling and Other (See Comments)    "jack me up"     Current Outpatient Medications  Medication Sig Dispense Refill  . augmented betamethasone dipropionate (DIPROLENE-AF) 0.05 % cream Apply 1 application topically 2 (two) times daily as needed.    . calcium carbonate (TUMS - DOSED IN MG ELEMENTAL CALCIUM) 500 MG chewable tablet Chew 1 tablet by mouth at bedtime as needed for indigestion or heartburn.    . cholestyramine (QUESTRAN) 4 g packet Take 1 packet by mouth 2 (two) times daily.    . cholestyramine (QUESTRAN) 4 GM/DOSE powder TAKE 1 PACKET (4 G TOTAL) BY MOUTH 2 (TWO) TIMES DAILY WITH A MEAL. 378 g 1  . fluocinonide (LIDEX) 0.05 % external solution APPLY TO SCALP DAILY    . lidocaine-prilocaine (EMLA) cream APPLY TO AFFECTED AREA ONCE AS DIRECTED    . liothyronine (CYTOMEL) 5 MCG tablet Take 5 mcg by mouth daily before breakfast.     . loperamide (IMODIUM) 2 MG capsule Take by mouth as needed for diarrhea or loose stools. 30 capsule 0  . potassium chloride (KLOR-CON) 10 MEQ tablet Take 1 tablet (10 mEq total) by mouth daily. 180 tablet 1  . propranolol (INDERAL) 10 MG tablet Take 10 mg by mouth daily as needed (for heart palpitations).     . Saccharomyces boulardii (PROBIOTIC) 250 MG CAPS Take by mouth. 60 capsule   . TIROSINT 137  MCG CAPS Take 1 capsule by mouth every morning.    . triamcinolone cream (KENALOG) 0.1 % APPLY TO AFFECTED AREA TWICE A DAY *MAX ON INSURANCE    . triamterene-hydrochlorothiazide (MAXZIDE-25) 37.5-25 MG tablet TAKE 1 TABLET BY MOUTH EVERY DAY 90 tablet 3   No current facility-administered medications for this visit.    OBJECTIVE: Middle-aged white woman in no acute distress Vitals:   11/24/19 1428  BP: 130/69  Pulse: 81  Resp: 18  Temp: 98.3 F (36.8 C)  SpO2: 97%   Wt Readings from Last 3 Encounters:  11/24/19 198 lb 6.4 oz (90 kg)  11/19/19 190 lb (86.2 kg)  10/20/19 196 lb 9.6 oz (89.2 kg)   Body mass index is 33.02 kg/m.    ECOG FS:1 - Symptomatic but completely ambulatory  Sclerae unicteric, EOMs intact Wearing a mask No cervical or supraclavicular adenopathy Lungs no rales or rhonchi Heart regular rate and rhythm Abd soft, nontender, positive bowel sounds MSK no focal spinal tenderness, no upper extremity lymphedema Neuro: nonfocal, well oriented, appropriate affect Breasts: Status post bilateral mastectomies.  On the right the expander has been removed.  There is no evidence of chest wall recurrence.  On the left the expander is in place.  The superior aspect is fairly stiff and she does have a feeling of burning there at times.  Both axillae are benign.   LAB RESULTS: No results found for: SPEP  Lab Results  Component Value Date   WBC 7.9 11/24/2019   NEUTROABS 5.5 11/24/2019   HGB 12.4 11/24/2019   HCT 37.3 11/24/2019   MCV 101.1 (H) 11/24/2019   PLT 258 11/24/2019      Chemistry      Component Value Date/Time   NA 141 11/24/2019 1348   NA 140 06/19/2017 1312   K 3.4 (L) 11/24/2019 1348   K 3.5 06/19/2017 1312   CL 104 11/24/2019 1348   CO2 25 11/24/2019 1348   CO2 25 06/19/2017 1312   BUN 10 11/24/2019 1348   BUN 18.4 06/19/2017 1312   CREATININE 0.90 11/24/2019 1348   CREATININE 0.79 06/09/2019 1326   CREATININE 0.8 06/19/2017 1312       Component Value Date/Time   CALCIUM 9.7 11/24/2019 1348   CALCIUM 10.2 06/19/2017 1312   ALKPHOS 76 11/24/2019 1348   ALKPHOS 86 06/19/2017 1312   AST 52 (H) 11/24/2019 1348   AST 69 (H) 06/09/2019 1326   AST 15 06/19/2017 1312   ALT 37 11/24/2019 1348   ALT 40 06/09/2019 1326   ALT 22 06/19/2017 1312   BILITOT 0.5 11/24/2019 1348   BILITOT 0.3 06/09/2019 1326   BILITOT 0.26 06/19/2017 1312      No results found for: LABCA2  No components found for: LABCA125  No results for input(s): INR in the last 168 hours.  Urinalysis    Component Value Date/Time   COLORURINE YELLOW 03/13/2009 2251   APPEARANCEUR CLOUDY (A) 03/13/2009 2251   LABSPEC 1.020 06/15/2014 1101   PHURINE 6.0 06/15/2014 1101   PHURINE 5.5 03/13/2009 2251   GLUCOSEU Negative 06/15/2014 1101   HGBUR Negative 06/15/2014 1101   HGBUR NEGATIVE 03/13/2009 2251   BILIRUBINUR Color Interference 06/15/2014 1101   KETONESUR Negative 06/15/2014 1101   KETONESUR NEGATIVE 03/13/2009 2251   PROTEINUR 30 06/15/2014 1101   PROTEINUR NEGATIVE 03/13/2009 2251   UROBILINOGEN 0.2 06/15/2014 1101   NITRITE Negative 06/15/2014 1101   NITRITE NEGATIVE 03/13/2009 2251   LEUKOCYTESUR Trace 06/15/2014 1101    STUDIES: No results found.   ASSESSMENT: 59 y.o. [place] woman status post left breast upper inner quadrant biopsy 02/08/2014 for a clinical T1c N0, stage IA invasive ductal carcinoma, grade 3, triple negative, with an MIB-1 of 50%.  (1) status post left lumpectomy and sentinel lymph node sampling 03/01/2014 for a pT1c pN0, stage IA invasive ductal carcinoma, grade 3, with negative margins, repeat prognostic panel again triple negative.  (2) adjuvant chemotherapy started a 03/25/2014 consisting of cyclophosphamide and doxorubicin given in dose dense fashion x4, completed 05/06/2014; followed by weekly paclitaxel x1, on  05/27/2014, poorly tolerated;   (a) switched to Abraxane starting 06/10/2014, with 11 Abraxane  doses planned  (b) dose reduced by 20% because of neutropenia starting 07/01/2014 dose  (c) discontinued after 4th Abraxane dose 07/15/2014 due to neuropathy  (3) adjuvant radiation completed 10/07/2014.  Left breast with breath hold technique/ 45 Gy at 1.8 Gy per fraction x 25 fractions.   Left breast boost/ 16 Gy at 2 Gy per fraction x 8 fractions  (4) genetics counseling 07/06/2015 through the Breast/Ovarian cancer gene panel offered by [facility] found no deleterious mutations in ATM, BARD1, BRCA1, BRCA2, BRIP1, CDH1, CHEK2, EPCAM, FANCC, MLH1, MSH2, MSH6, NBN, PALB2, PMS2, PTEN, RAD51C, RAD51D, TP53, and XRCC2.  (5) question of sleep apnea  (6) transverse colon lesion noted on CT scan from 06/17/2014 - negative biopsy, also showed fatty liver  (a) repeat CT scan of the abdomen and pelvis with contrast 02/15/2016 negative  RECURRENT DISEASE: February 2020 (7) biopsy of palpable mass upper inner left breast 11/10/2018 confirms invasive ductal carcinoma, grade 2 or 3, triple negative, with an MIB-1 of 80%.  (a) CT scans of the chest abdomen and pelvis with contrast showed no stage IV disease  (b) bone scan 11/13/2018 shows no definite metastasis to bone  (8) neoadjuvant chemotherapy consisting of carboplatin and gemcitabine given days 1 and 8 of each 21-day cycle, for 6 cycles, started 11/26/2018, completed 03/18/2019  (a) repeat ultrasound 01/25/2019 (after 3 cycles) shows no change in measurable breast mass   (9) bilateral mastectomies as follows   (a) left mastectomy and sentinel lymph node sampling with immediate tissue expander placement and latissimus flap 04/12/2019 showed a residual ypT1a ypN0 invasive ductal carcinoma, grade 3, with negative margins; a single sentinel lymph node was removed.  Margins were negative  (b) right mastectomy with immediate implant placement showed no malignancy  (c) right expander removed 06/02/2019 secondary to infectious complications  (10)  molecular testing on the 11/10/2018 biopsy showed negative PD-L1, negative androgen receptor, stable MSI and proficient mismatch repair status.  The tumor mutational burden was low.  No PIK3 mutation was detected.  There was a pathogenic TP53 variant and negative ER PR and HER-2 were confirmed.  (11) Adjuvant pembrolizumab given every 3 weeks beginning 06/09/2019, discontinued 09/22/2019 with rash  (12) Adjuvant capecitabine 1550m PO BID for 14 days on and 7 days off on a 21 day cycle starting 06/30/2019, discontinued January/2021 due to diarrhea   PLAN: CDrakestill has diarrhea on and off.  I have encouraged her to call Dr. SMichail Sermonand indicates she is due for a colonoscopy.  He can also advise her how to manage issue.  Even though she still has a rash it is considerably improved and she is much less itchy than before.  In short we try to intensify treatment and she did receive some [place] and some capecitabine but from this point as she is going on observation  She will have her right expander replaced and then go through expansion until she is ready for definitive implant placement but she still does not know if she is going to do saline or silicone.  Hopefully this can be done by July or August.  I am going to see her in October.  That will be our new baseline.  We will set her up with photos at that time.  I will probably follow her on a yearly basis thereafter  She knows to call for any other issue that may develop before the next visit  Total encounter time 30 minutes.Sarajane Jews C. Jamey Demchak, MD 11/24/19 3:04 PM Medical Oncology and Hematology [facility] [place], [place] [address] Tel. [phone]    Fax. [phone]   I, Wilburn Mylar, am acting as scribe for Dr. Virgie Dad. Joslyne Marshburn.  I, Lurline Del MD, have reviewed the above documentation for accuracy and completeness, and I agree with the above.    *Total Encounter Time as  defined by the Centers for Medicare and Medicaid Services includes, in addition to the face-to-face time of a patient visit (documented in the note above) non-face-to-face time: obtaining and reviewing outside history, ordering and reviewing medications, tests or procedures, care coordination (communications with other health care professionals or caregivers) and documentation in the medical record.

## 2019-11-24 ENCOUNTER — Inpatient Hospital Stay: Payer: 59 | Attending: Oncology

## 2019-11-24 ENCOUNTER — Inpatient Hospital Stay: Payer: 59

## 2019-11-24 ENCOUNTER — Inpatient Hospital Stay (HOSPITAL_BASED_OUTPATIENT_CLINIC_OR_DEPARTMENT_OTHER): Payer: 59 | Admitting: Oncology

## 2019-11-24 ENCOUNTER — Other Ambulatory Visit: Payer: Self-pay

## 2019-11-24 VITALS — BP 130/69 | HR 81 | Temp 98.3°F | Resp 18 | Ht 65.0 in | Wt 198.4 lb

## 2019-11-24 DIAGNOSIS — E039 Hypothyroidism, unspecified: Secondary | ICD-10-CM | POA: Insufficient documentation

## 2019-11-24 DIAGNOSIS — Z171 Estrogen receptor negative status [ER-]: Secondary | ICD-10-CM

## 2019-11-24 DIAGNOSIS — R197 Diarrhea, unspecified: Secondary | ICD-10-CM | POA: Diagnosis not present

## 2019-11-24 DIAGNOSIS — Z95828 Presence of other vascular implants and grafts: Secondary | ICD-10-CM

## 2019-11-24 DIAGNOSIS — C50912 Malignant neoplasm of unspecified site of left female breast: Secondary | ICD-10-CM | POA: Diagnosis not present

## 2019-11-24 DIAGNOSIS — C50212 Malignant neoplasm of upper-inner quadrant of left female breast: Secondary | ICD-10-CM | POA: Diagnosis not present

## 2019-11-24 DIAGNOSIS — G629 Polyneuropathy, unspecified: Secondary | ICD-10-CM | POA: Insufficient documentation

## 2019-11-24 DIAGNOSIS — Z79899 Other long term (current) drug therapy: Secondary | ICD-10-CM | POA: Diagnosis not present

## 2019-11-24 DIAGNOSIS — L408 Other psoriasis: Secondary | ICD-10-CM | POA: Diagnosis not present

## 2019-11-24 DIAGNOSIS — Z8049 Family history of malignant neoplasm of other genital organs: Secondary | ICD-10-CM | POA: Insufficient documentation

## 2019-11-24 DIAGNOSIS — R21 Rash and other nonspecific skin eruption: Secondary | ICD-10-CM | POA: Diagnosis not present

## 2019-11-24 DIAGNOSIS — Z8249 Family history of ischemic heart disease and other diseases of the circulatory system: Secondary | ICD-10-CM | POA: Insufficient documentation

## 2019-11-24 DIAGNOSIS — Z9013 Acquired absence of bilateral breasts and nipples: Secondary | ICD-10-CM | POA: Insufficient documentation

## 2019-11-24 DIAGNOSIS — Z923 Personal history of irradiation: Secondary | ICD-10-CM | POA: Diagnosis not present

## 2019-11-24 DIAGNOSIS — Z807 Family history of other malignant neoplasms of lymphoid, hematopoietic and related tissues: Secondary | ICD-10-CM | POA: Insufficient documentation

## 2019-11-24 DIAGNOSIS — I10 Essential (primary) hypertension: Secondary | ICD-10-CM | POA: Insufficient documentation

## 2019-11-24 DIAGNOSIS — G4733 Obstructive sleep apnea (adult) (pediatric): Secondary | ICD-10-CM

## 2019-11-24 DIAGNOSIS — Z9221 Personal history of antineoplastic chemotherapy: Secondary | ICD-10-CM | POA: Diagnosis not present

## 2019-11-24 DIAGNOSIS — Z8052 Family history of malignant neoplasm of bladder: Secondary | ICD-10-CM | POA: Insufficient documentation

## 2019-11-24 DIAGNOSIS — E7800 Pure hypercholesterolemia, unspecified: Secondary | ICD-10-CM | POA: Diagnosis not present

## 2019-11-24 DIAGNOSIS — K760 Fatty (change of) liver, not elsewhere classified: Secondary | ICD-10-CM | POA: Diagnosis not present

## 2019-11-24 DIAGNOSIS — Z87891 Personal history of nicotine dependence: Secondary | ICD-10-CM | POA: Diagnosis not present

## 2019-11-24 DIAGNOSIS — E038 Other specified hypothyroidism: Secondary | ICD-10-CM

## 2019-11-24 DIAGNOSIS — Z801 Family history of malignant neoplasm of trachea, bronchus and lung: Secondary | ICD-10-CM | POA: Insufficient documentation

## 2019-11-24 DIAGNOSIS — Z808 Family history of malignant neoplasm of other organs or systems: Secondary | ICD-10-CM | POA: Diagnosis not present

## 2019-11-24 LAB — CBC WITH DIFFERENTIAL/PLATELET
Abs Immature Granulocytes: 0.04 10*3/uL (ref 0.00–0.07)
Basophils Absolute: 0.1 10*3/uL (ref 0.0–0.1)
Basophils Relative: 1 %
Eosinophils Absolute: 0.1 10*3/uL (ref 0.0–0.5)
Eosinophils Relative: 2 %
HCT: 37.3 % (ref 36.0–46.0)
Hemoglobin: 12.4 g/dL (ref 12.0–15.0)
Immature Granulocytes: 1 %
Lymphocytes Relative: 18 %
Lymphs Abs: 1.5 10*3/uL (ref 0.7–4.0)
MCH: 33.6 pg (ref 26.0–34.0)
MCHC: 33.2 g/dL (ref 30.0–36.0)
MCV: 101.1 fL — ABNORMAL HIGH (ref 80.0–100.0)
Monocytes Absolute: 0.7 10*3/uL (ref 0.1–1.0)
Monocytes Relative: 9 %
Neutro Abs: 5.5 10*3/uL (ref 1.7–7.7)
Neutrophils Relative %: 69 %
Platelets: 258 10*3/uL (ref 150–400)
RBC: 3.69 MIL/uL — ABNORMAL LOW (ref 3.87–5.11)
RDW: 13.3 % (ref 11.5–15.5)
WBC: 7.9 10*3/uL (ref 4.0–10.5)
nRBC: 0 % (ref 0.0–0.2)

## 2019-11-24 LAB — COMPREHENSIVE METABOLIC PANEL
ALT: 37 U/L (ref 0–44)
AST: 52 U/L — ABNORMAL HIGH (ref 15–41)
Albumin: 4 g/dL (ref 3.5–5.0)
Alkaline Phosphatase: 76 U/L (ref 38–126)
Anion gap: 12 (ref 5–15)
BUN: 10 mg/dL (ref 6–20)
CO2: 25 mmol/L (ref 22–32)
Calcium: 9.7 mg/dL (ref 8.9–10.3)
Chloride: 104 mmol/L (ref 98–111)
Creatinine, Ser: 0.9 mg/dL (ref 0.44–1.00)
GFR calc Af Amer: >60 mL/min (ref >60)
GFR calc non Af Amer: >60 mL/min (ref >60)
Glucose, Bld: 106 mg/dL — ABNORMAL HIGH (ref 70–99)
Potassium: 3.4 mmol/L — ABNORMAL LOW (ref 3.5–5.1)
Sodium: 141 mmol/L (ref 135–145)
Total Bilirubin: 0.5 mg/dL (ref 0.3–1.2)
Total Protein: 8 g/dL (ref 6.5–8.1)

## 2019-11-24 LAB — TSH: TSH: 3.574 u[IU]/mL (ref 0.308–3.960)

## 2019-11-24 MED ORDER — HEPARIN SOD (PORK) LOCK FLUSH 100 UNIT/ML IV SOLN
500.0000 [IU] | Freq: Once | INTRAVENOUS | Status: AC
  Administered 2019-11-24: 500 [IU]
  Filled 2019-11-24: qty 5

## 2019-11-24 MED ORDER — SODIUM CHLORIDE 0.9% FLUSH
10.0000 mL | Freq: Once | INTRAVENOUS | Status: AC
  Administered 2019-11-24: 10 mL
  Filled 2019-11-24: qty 10

## 2019-11-25 ENCOUNTER — Telehealth: Payer: Self-pay | Admitting: Oncology

## 2019-11-25 NOTE — Telephone Encounter (Signed)
I talk with patient regarding schedule  

## 2019-12-15 ENCOUNTER — Other Ambulatory Visit: Payer: 59

## 2019-12-15 ENCOUNTER — Ambulatory Visit: Payer: 59 | Admitting: Oncology

## 2019-12-20 ENCOUNTER — Other Ambulatory Visit: Payer: Self-pay | Admitting: *Deleted

## 2019-12-20 DIAGNOSIS — C50212 Malignant neoplasm of upper-inner quadrant of left female breast: Secondary | ICD-10-CM

## 2019-12-20 DIAGNOSIS — Z171 Estrogen receptor negative status [ER-]: Secondary | ICD-10-CM

## 2019-12-20 DIAGNOSIS — K589 Irritable bowel syndrome without diarrhea: Secondary | ICD-10-CM

## 2019-12-20 DIAGNOSIS — K219 Gastro-esophageal reflux disease without esophagitis: Secondary | ICD-10-CM

## 2019-12-20 NOTE — Progress Notes (Unsigned)
[place]

## 2019-12-21 ENCOUNTER — Encounter: Payer: Self-pay | Admitting: Gastroenterology

## 2020-01-07 ENCOUNTER — Other Ambulatory Visit: Payer: Self-pay | Admitting: Oncology

## 2020-01-10 NOTE — H&P (Signed)
Subjective:     Patient ID: Alexandria Bradley is a 59 y.o. female.  HPI  9 months post op bilateral mastectomies with immediate expander based reconstruction. Course complicated by onset fever and open wound right chest at 7 weeks post op. Now 7 months post op removal right TE/ADM.    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 Underwent lumpectomy, SLN followed by adjuvant radiation and chemotherapy.  Represented 10/2018 with palpable left breast mass. MMG/US sowed mass near lumpectomy scar at 10 o'clock, 7 cmfn, 14 x 12 x 13 mm. No axillary adenopathy. Biopsy with triple negative IDC.  MRI demonstrated known mass left breast UIQ 1.7 cm, LN normal.  Staging scans negative.  Completed neoadjuvant chemotherapy. Final MRI with mass measuring 1.5 cm. Started pembrolizumab adjuvantly, complicated by rash, and discontinued. Adjuvant capecitabine discontinued due to diarrhea.  Final pathology left breast 0.5 cm IDC, 0/1 SLN.  Genetics 2016 negative.  Prior to lumpectomy B cup, prior to mastectomies wearing same but noted depression in area lumpectomy and does not fill out cup equally. Right mastectomy 622 g Left 655 g  Worked as reception for a Pharmacist, community prior to [employer], patient does not plan to return to work. Lives with husband who is a Insurance underwriter. Son and daughter in area, latter is RN with [employer].     Objective:   Physical Exam  Cardiovascular: Normal rate, regular rhythm and normal heart sounds.  Pulmonary/Chest: Effort normal and breath sounds normal.     Chest:  Right chest soft tissue contracted  Right chest port Left chest expanded Papular rash over chest bilateral UE and LE  Assessment:     History triple negative left breast cancer s/p left lumpectomy, SLN History therapeutic radiation Recurrent left breast cancer Neoadjuvant chemotherapy S/p bilateral mastectomies (right SRM, Left SSM), right  prepectoral TE/ADM ([facility]oderm), left TE/LD reconstruction S/p removal right chest TE/ADM    Plan:   Plan replacement right TE and acellular dermis, removal right chest port. Reviewed cadaveric source acellular dermis, off label use in breast reconstruction. Additional risks including but not limited to bleeding, hematoma, seroma, infection, blood clots in lungs and legs, damage to adjacent structures, need for additional discussed.  Plan OP surgery. Reviewed drains, weekly expansion.   Discussed risk COVID infectionthrough this elective surgery. Patient will receive COVID testing prior to surgery. Discussed even if patient receivesa negative test result, the tests in some cases may fail to detect the virus or patient maycontract COVID after the test.COVID 19 infectionbefore/during/aftersurgery may result in lead to a higher chance of complication and death.  Rx for oxycodone robaxin and doxycycline given.  Patient hoping to receive vaccine COVID- counseled can receive any time but would leave a5-7 days from dose to surgery in case side effects.   LEFT Natrelle [id number] 400 ml tissue expander placed, initial fill volume 285 ml saline

## 2020-01-12 ENCOUNTER — Other Ambulatory Visit: Payer: Self-pay

## 2020-01-12 ENCOUNTER — Inpatient Hospital Stay: Payer: 59 | Attending: Oncology

## 2020-01-12 DIAGNOSIS — Z452 Encounter for adjustment and management of vascular access device: Secondary | ICD-10-CM | POA: Insufficient documentation

## 2020-01-12 DIAGNOSIS — Z923 Personal history of irradiation: Secondary | ICD-10-CM | POA: Insufficient documentation

## 2020-01-12 DIAGNOSIS — Z9013 Acquired absence of bilateral breasts and nipples: Secondary | ICD-10-CM | POA: Insufficient documentation

## 2020-01-12 DIAGNOSIS — C50212 Malignant neoplasm of upper-inner quadrant of left female breast: Secondary | ICD-10-CM | POA: Diagnosis present

## 2020-01-12 DIAGNOSIS — Z171 Estrogen receptor negative status [ER-]: Secondary | ICD-10-CM | POA: Diagnosis not present

## 2020-01-12 DIAGNOSIS — Z95828 Presence of other vascular implants and grafts: Secondary | ICD-10-CM

## 2020-01-12 DIAGNOSIS — Z9221 Personal history of antineoplastic chemotherapy: Secondary | ICD-10-CM | POA: Diagnosis not present

## 2020-01-12 MED ORDER — SODIUM CHLORIDE 0.9% FLUSH
10.0000 mL | Freq: Once | INTRAVENOUS | Status: AC
  Administered 2020-01-12: 10 mL
  Filled 2020-01-12: qty 10

## 2020-01-12 MED ORDER — HEPARIN SOD (PORK) LOCK FLUSH 100 UNIT/ML IV SOLN
500.0000 [IU] | Freq: Once | INTRAVENOUS | Status: AC
  Administered 2020-01-12: 500 [IU]
  Filled 2020-01-12: qty 5

## 2020-01-13 ENCOUNTER — Other Ambulatory Visit: Payer: Self-pay

## 2020-01-13 ENCOUNTER — Encounter (HOSPITAL_BASED_OUTPATIENT_CLINIC_OR_DEPARTMENT_OTHER): Payer: Self-pay | Admitting: Plastic Surgery

## 2020-01-13 ENCOUNTER — Ambulatory Visit: Payer: 59

## 2020-01-20 ENCOUNTER — Encounter (HOSPITAL_BASED_OUTPATIENT_CLINIC_OR_DEPARTMENT_OTHER)
Admission: RE | Admit: 2020-01-20 | Discharge: 2020-01-20 | Disposition: A | Discharge status: Home / Self Care | Payer: 59 | Source: Ambulatory Visit | Attending: Plastic Surgery | Admitting: Plastic Surgery

## 2020-01-20 ENCOUNTER — Other Ambulatory Visit (HOSPITAL_COMMUNITY)
Admission: RE | Admit: 2020-01-20 | Discharge: 2020-01-20 | Disposition: A | Discharge status: Home / Self Care | Payer: 59 | Source: Ambulatory Visit | Attending: Plastic Surgery | Admitting: Plastic Surgery

## 2020-01-20 DIAGNOSIS — Z20822 Contact with and (suspected) exposure to covid-19: Secondary | ICD-10-CM | POA: Diagnosis not present

## 2020-01-20 DIAGNOSIS — Z01818 Encounter for other preprocedural examination: Secondary | ICD-10-CM | POA: Insufficient documentation

## 2020-01-20 DIAGNOSIS — I10 Essential (primary) hypertension: Secondary | ICD-10-CM | POA: Diagnosis not present

## 2020-01-20 LAB — SARS CORONAVIRUS 2 (TAT 6-24 HRS): SARS Coronavirus 2: NEGATIVE

## 2020-01-20 LAB — BASIC METABOLIC PANEL
Anion gap: 12 (ref 5–15)
BUN: 9 mg/dL (ref 8–23)
CO2: 23 mmol/L (ref 22–32)
Calcium: 9.9 mg/dL (ref 8.9–10.3)
Chloride: 103 mmol/L (ref 98–111)
Creatinine, Ser: 0.69 mg/dL (ref 0.44–1.00)
GFR calc Af Amer: >60 mL/min (ref >60)
GFR calc non Af Amer: >60 mL/min (ref >60)
Glucose, Bld: 103 mg/dL — ABNORMAL HIGH (ref 70–99)
Potassium: 3.9 mmol/L (ref 3.5–5.1)
Sodium: 138 mmol/L (ref 135–145)

## 2020-01-20 NOTE — Progress Notes (Signed)

## 2020-01-23 ENCOUNTER — Encounter (HOSPITAL_BASED_OUTPATIENT_CLINIC_OR_DEPARTMENT_OTHER): Payer: Self-pay | Admitting: Plastic Surgery

## 2020-01-23 NOTE — Anesthesia Preprocedure Evaluation (Addendum)
Anesthesia Evaluation  Patient identified by MRN, date of birth, ID band Patient awake    Reviewed: Allergy & Precautions, NPO status , Patient's Chart, lab work & pertinent test results, reviewed documented beta blocker date and time   History of Anesthesia Complications (+) PONV and history of anesthetic complications  Airway Mallampati: II  TM Distance: >3 FB Neck ROM: Full    Dental no notable dental hx. (+) Teeth Intact   Pulmonary sleep apnea and Continuous Positive Airway Pressure Ventilation , former smoker,    Pulmonary exam normal breath sounds clear to auscultation       Cardiovascular hypertension, Pt. on medications and Pt. on home beta blockers Normal cardiovascular exam Rhythm:Regular Rate:Normal     Neuro/Psych  Headaches, PSYCHIATRIC DISORDERS Anxiety Chemo induced peripheral neuropathy    GI/Hepatic GERD  Medicated and Controlled,Fatty Liver   Endo/Other  Hypothyroidism Hypercholesterolemia  Left Breast Ca- S/P Bilateral mastectomies, RT, ChemoRx   Renal/GU Renal diseaseHx/o renal calculi  negative genitourinary   Musculoskeletal  (+) Arthritis , Osteoarthritis,  Osteoporosis Psoriasis   Abdominal   Peds  Hematology   Anesthesia Other Findings   Reproductive/Obstetrics                            Anesthesia Physical Anesthesia Plan  ASA: III  Anesthesia Plan: General   Post-op Pain Management:    Induction: Intravenous  PONV Risk Score and Plan: 4 or greater and Scopolamine patch - Pre-op, Midazolam, Dexamethasone, Ondansetron and Treatment may vary due to age or medical condition  Airway Management Planned: Oral ETT  Additional Equipment:   Intra-op Plan:   Post-operative Plan: Extubation in OR  Informed Consent: I have reviewed the patients History and Physical, chart, labs and discussed the procedure including the risks, benefits and alternatives for the  proposed anesthesia with the patient or authorized representative who has indicated his/her understanding and acceptance.     Dental advisory given  Plan Discussed with: CRNA and Surgeon  Anesthesia Plan Comments:        Anesthesia Quick Evaluation

## 2020-01-24 ENCOUNTER — Encounter (HOSPITAL_BASED_OUTPATIENT_CLINIC_OR_DEPARTMENT_OTHER): Payer: Self-pay | Admitting: Plastic Surgery

## 2020-01-24 ENCOUNTER — Ambulatory Visit (HOSPITAL_BASED_OUTPATIENT_CLINIC_OR_DEPARTMENT_OTHER): Payer: No Typology Code available for payment source | Admitting: Anesthesiology

## 2020-01-24 ENCOUNTER — Encounter (HOSPITAL_BASED_OUTPATIENT_CLINIC_OR_DEPARTMENT_OTHER)
Admission: RE | Disposition: A | Discharge status: Home / Self Care | Payer: Self-pay | Source: Home / Self Care | Attending: Plastic Surgery

## 2020-01-24 ENCOUNTER — Other Ambulatory Visit: Payer: Self-pay

## 2020-01-24 ENCOUNTER — Ambulatory Visit (HOSPITAL_BASED_OUTPATIENT_CLINIC_OR_DEPARTMENT_OTHER)
Admission: RE | Admit: 2020-01-24 | Discharge: 2020-01-24 | Disposition: A | Discharge status: Home / Self Care | Payer: No Typology Code available for payment source | Attending: Plastic Surgery | Admitting: Plastic Surgery

## 2020-01-24 DIAGNOSIS — E7800 Pure hypercholesterolemia, unspecified: Secondary | ICD-10-CM | POA: Insufficient documentation

## 2020-01-24 DIAGNOSIS — T451X5A Adverse effect of antineoplastic and immunosuppressive drugs, initial encounter: Secondary | ICD-10-CM | POA: Insufficient documentation

## 2020-01-24 DIAGNOSIS — I10 Essential (primary) hypertension: Secondary | ICD-10-CM | POA: Diagnosis not present

## 2020-01-24 DIAGNOSIS — Z923 Personal history of irradiation: Secondary | ICD-10-CM | POA: Insufficient documentation

## 2020-01-24 DIAGNOSIS — M810 Age-related osteoporosis without current pathological fracture: Secondary | ICD-10-CM | POA: Insufficient documentation

## 2020-01-24 DIAGNOSIS — M1990 Unspecified osteoarthritis, unspecified site: Secondary | ICD-10-CM | POA: Diagnosis not present

## 2020-01-24 DIAGNOSIS — G4730 Sleep apnea, unspecified: Secondary | ICD-10-CM | POA: Insufficient documentation

## 2020-01-24 DIAGNOSIS — K219 Gastro-esophageal reflux disease without esophagitis: Secondary | ICD-10-CM | POA: Insufficient documentation

## 2020-01-24 DIAGNOSIS — R519 Headache, unspecified: Secondary | ICD-10-CM | POA: Diagnosis not present

## 2020-01-24 DIAGNOSIS — Z20822 Contact with and (suspected) exposure to covid-19: Secondary | ICD-10-CM | POA: Diagnosis not present

## 2020-01-24 DIAGNOSIS — G620 Drug-induced polyneuropathy: Secondary | ICD-10-CM | POA: Insufficient documentation

## 2020-01-24 DIAGNOSIS — E039 Hypothyroidism, unspecified: Secondary | ICD-10-CM | POA: Insufficient documentation

## 2020-01-24 DIAGNOSIS — Z87891 Personal history of nicotine dependence: Secondary | ICD-10-CM | POA: Insufficient documentation

## 2020-01-24 DIAGNOSIS — Z421 Encounter for breast reconstruction following mastectomy: Secondary | ICD-10-CM | POA: Diagnosis present

## 2020-01-24 DIAGNOSIS — F419 Anxiety disorder, unspecified: Secondary | ICD-10-CM | POA: Insufficient documentation

## 2020-01-24 DIAGNOSIS — Z853 Personal history of malignant neoplasm of breast: Secondary | ICD-10-CM | POA: Diagnosis not present

## 2020-01-24 DIAGNOSIS — Z9013 Acquired absence of bilateral breasts and nipples: Secondary | ICD-10-CM | POA: Diagnosis not present

## 2020-01-24 DIAGNOSIS — Z87442 Personal history of urinary calculi: Secondary | ICD-10-CM | POA: Diagnosis not present

## 2020-01-24 DIAGNOSIS — Z9221 Personal history of antineoplastic chemotherapy: Secondary | ICD-10-CM | POA: Diagnosis not present

## 2020-01-24 DIAGNOSIS — L409 Psoriasis, unspecified: Secondary | ICD-10-CM | POA: Insufficient documentation

## 2020-01-24 HISTORY — PX: PORT-A-CATH REMOVAL: SHX5289

## 2020-01-24 HISTORY — PX: BREAST RECONSTRUCTION WITH PLACEMENT OF TISSUE EXPANDER AND ALLODERM: SHX6805

## 2020-01-24 SURGERY — BREAST RECONSTRUCTION WITH PLACEMENT OF TISSUE EXPANDER AND ALLODERM
Anesthesia: General | Site: Chest | Laterality: Right

## 2020-01-24 MED ORDER — CELECOXIB 200 MG PO CAPS
200.0000 mg | ORAL_CAPSULE | ORAL | Status: AC
  Administered 2020-01-24: 200 mg via ORAL

## 2020-01-24 MED ORDER — GABAPENTIN 300 MG PO CAPS
ORAL_CAPSULE | ORAL | Status: AC
  Filled 2020-01-24: qty 1

## 2020-01-24 MED ORDER — ACETAMINOPHEN 500 MG PO TABS
ORAL_TABLET | ORAL | Status: AC
  Filled 2020-01-24: qty 2

## 2020-01-24 MED ORDER — FENTANYL CITRATE (PF) 100 MCG/2ML IJ SOLN
INTRAMUSCULAR | Status: AC
  Filled 2020-01-24: qty 2

## 2020-01-24 MED ORDER — CEFAZOLIN SODIUM-DEXTROSE 2-4 GM/100ML-% IV SOLN
INTRAVENOUS | Status: AC
  Filled 2020-01-24: qty 100

## 2020-01-24 MED ORDER — SODIUM CHLORIDE 0.9 % IV SOLN
INTRAVENOUS | Status: DC | PRN
  Administered 2020-01-24: 500 mL

## 2020-01-24 MED ORDER — ONDANSETRON HCL 4 MG/2ML IJ SOLN
INTRAMUSCULAR | Status: DC | PRN
  Administered 2020-01-24: 4 mg via INTRAVENOUS

## 2020-01-24 MED ORDER — GABAPENTIN 300 MG PO CAPS
300.0000 mg | ORAL_CAPSULE | ORAL | Status: AC
  Administered 2020-01-24: 300 mg via ORAL

## 2020-01-24 MED ORDER — LIDOCAINE 2% (20 MG/ML) 5 ML SYRINGE
INTRAMUSCULAR | Status: DC | PRN
  Administered 2020-01-24: 80 mg via INTRAVENOUS

## 2020-01-24 MED ORDER — CEFAZOLIN SODIUM-DEXTROSE 2-4 GM/100ML-% IV SOLN
2.0000 g | INTRAVENOUS | Status: AC
  Administered 2020-01-24: 2 g via INTRAVENOUS

## 2020-01-24 MED ORDER — LIDOCAINE 2% (20 MG/ML) 5 ML SYRINGE
INTRAMUSCULAR | Status: AC
  Filled 2020-01-24: qty 5

## 2020-01-24 MED ORDER — PROMETHAZINE HCL 25 MG/ML IJ SOLN
6.2500 mg | Freq: Four times a day (QID) | INTRAMUSCULAR | Status: DC | PRN
  Administered 2020-01-24: 10:00:00 6.25 mg via INTRAVENOUS

## 2020-01-24 MED ORDER — FENTANYL CITRATE (PF) 100 MCG/2ML IJ SOLN
50.0000 ug | INTRAMUSCULAR | Status: AC | PRN
  Administered 2020-01-24 (×3): 50 ug via INTRAVENOUS

## 2020-01-24 MED ORDER — OXYCODONE HCL 5 MG/5ML PO SOLN: 5.0000 mg | Freq: Once | ORAL | Status: DC | PRN

## 2020-01-24 MED ORDER — SCOPOLAMINE 1 MG/3DAYS TD PT72
MEDICATED_PATCH | TRANSDERMAL | Status: AC
  Filled 2020-01-24: qty 1

## 2020-01-24 MED ORDER — CELECOXIB 200 MG PO CAPS
ORAL_CAPSULE | ORAL | Status: AC
  Filled 2020-01-24: qty 1

## 2020-01-24 MED ORDER — POVIDONE-IODINE 10 % EX SOLN
CUTANEOUS | Status: DC | PRN
  Administered 2020-01-24: 1 via TOPICAL

## 2020-01-24 MED ORDER — ROCURONIUM BROMIDE 10 MG/ML (PF) SYRINGE
PREFILLED_SYRINGE | INTRAVENOUS | Status: AC
  Filled 2020-01-24: qty 10

## 2020-01-24 MED ORDER — HYDROMORPHONE HCL 1 MG/ML IJ SOLN
INTRAMUSCULAR | Status: AC
  Filled 2020-01-24: qty 0.5

## 2020-01-24 MED ORDER — OXYCODONE HCL 5 MG PO TABS: 5.0000 mg | ORAL_TABLET | Freq: Once | ORAL | Status: DC | PRN

## 2020-01-24 MED ORDER — PROPOFOL 10 MG/ML IV BOLUS
INTRAVENOUS | Status: DC | PRN
  Administered 2020-01-24: 160 mg via INTRAVENOUS

## 2020-01-24 MED ORDER — PROMETHAZINE HCL 25 MG/ML IJ SOLN
INTRAMUSCULAR | Status: AC
  Filled 2020-01-24: qty 1

## 2020-01-24 MED ORDER — MIDAZOLAM HCL 2 MG/2ML IJ SOLN
1.0000 mg | INTRAMUSCULAR | Status: DC | PRN
  Administered 2020-01-24: 07:00:00 2 mg via INTRAVENOUS

## 2020-01-24 MED ORDER — PROPOFOL 500 MG/50ML IV EMUL
INTRAVENOUS | Status: AC
  Filled 2020-01-24: qty 50

## 2020-01-24 MED ORDER — ACETAMINOPHEN 500 MG PO TABS
1000.0000 mg | ORAL_TABLET | ORAL | Status: AC
  Administered 2020-01-24: 07:00:00 1000 mg via ORAL

## 2020-01-24 MED ORDER — SCOPOLAMINE 1 MG/3DAYS TD PT72
1.0000 | MEDICATED_PATCH | TRANSDERMAL | Status: DC
  Administered 2020-01-24: 07:00:00 1.5 mg via TRANSDERMAL

## 2020-01-24 MED ORDER — HYDROMORPHONE HCL 1 MG/ML IJ SOLN
0.2500 mg | INTRAMUSCULAR | Status: DC | PRN
  Administered 2020-01-24: 10:00:00 0.5 mg via INTRAVENOUS

## 2020-01-24 MED ORDER — LACTATED RINGERS IV SOLN: INTRAVENOUS | Status: DC

## 2020-01-24 MED ORDER — MIDAZOLAM HCL 2 MG/2ML IJ SOLN
INTRAMUSCULAR | Status: AC
  Filled 2020-01-24: qty 2

## 2020-01-24 SURGICAL SUPPLY — 101 items
ADH SKN CLS APL DERMABOND .7 (GAUZE/BANDAGES/DRESSINGS) ×2
ALLODERM 16X20 PERFORATED (Tissue) ×1 IMPLANT
ALLOGRAFT PERF 16X20 1.6+/-0.4 (Tissue) ×1 IMPLANT
APL PRP STRL LF DISP 70% ISPRP (MISCELLANEOUS)
APL SKNCLS STERI-STRIP NONHPOA (GAUZE/BANDAGES/DRESSINGS)
BAG DECANTER FOR FLEXI CONT (MISCELLANEOUS) ×4 IMPLANT
BENZOIN TINCTURE PRP APPL 2/3 (GAUZE/BANDAGES/DRESSINGS) IMPLANT
BINDER BREAST LRG (GAUZE/BANDAGES/DRESSINGS) IMPLANT
BINDER BREAST MEDIUM (GAUZE/BANDAGES/DRESSINGS) IMPLANT
BINDER BREAST XLRG (GAUZE/BANDAGES/DRESSINGS) IMPLANT
BINDER BREAST XXLRG (GAUZE/BANDAGES/DRESSINGS) IMPLANT
BLADE CLIPPER SURG (BLADE) IMPLANT
BLADE SURG 10 STRL SS (BLADE) ×6 IMPLANT
BLADE SURG 15 STRL LF DISP TIS (BLADE) ×2 IMPLANT
BLADE SURG 15 STRL SS (BLADE) ×4
BNDG GAUZE ELAST 4 BULKY (GAUZE/BANDAGES/DRESSINGS) IMPLANT
CANISTER SUCT 1200ML W/VALVE (MISCELLANEOUS) ×4 IMPLANT
CHLORAPREP W/TINT 26 (MISCELLANEOUS) ×2 IMPLANT
CLOSURE WOUND 1/2 X4 (GAUZE/BANDAGES/DRESSINGS)
COUNTER NEEDLE 1200 MAGNETIC (NEEDLE) IMPLANT
COVER BACK TABLE 60X90IN (DRAPES) ×4 IMPLANT
COVER MAYO STAND STRL (DRAPES) ×6 IMPLANT
COVER WAND RF STERILE (DRAPES) IMPLANT
DERMABOND ADVANCED (GAUZE/BANDAGES/DRESSINGS) ×2
DERMABOND ADVANCED .7 DNX12 (GAUZE/BANDAGES/DRESSINGS) ×2 IMPLANT
DRAIN CHANNEL 15F RND FF W/TCR (WOUND CARE) ×2 IMPLANT
DRAIN CHANNEL 19F RND (DRAIN) ×2 IMPLANT
DRAPE LAPAROTOMY 100X72 PEDS (DRAPES) IMPLANT
DRAPE TOP ARMCOVERS (MISCELLANEOUS) ×4 IMPLANT
DRAPE U-SHAPE 76X120 STRL (DRAPES) ×4 IMPLANT
DRAPE UTILITY XL STRL (DRAPES) ×6 IMPLANT
DRSG PAD ABDOMINAL 8X10 ST (GAUZE/BANDAGES/DRESSINGS) ×6 IMPLANT
DRSG TEGADERM 2-3/8X2-3/4 SM (GAUZE/BANDAGES/DRESSINGS) IMPLANT
DRSG TEGADERM 4X10 (GAUZE/BANDAGES/DRESSINGS) IMPLANT
DURAPREP 26ML APPLICATOR (WOUND CARE) ×4 IMPLANT
ELECT BLADE 4.0 EZ CLEAN MEGAD (MISCELLANEOUS)
ELECT COATED BLADE 2.86 ST (ELECTRODE) ×4 IMPLANT
ELECT REM PT RETURN 9FT ADLT (ELECTROSURGICAL) ×4
ELECTRODE BLDE 4.0 EZ CLN MEGD (MISCELLANEOUS) ×2 IMPLANT
ELECTRODE REM PT RTRN 9FT ADLT (ELECTROSURGICAL) IMPLANT
EVACUATOR SILICONE 100CC (DRAIN) ×4 IMPLANT
EXPANDER TISSUE FV FOURTE 400 (Prosthesis & Implant Plastic) IMPLANT
GAUZE SPONGE 4X4 12PLY STRL LF (GAUZE/BANDAGES/DRESSINGS) IMPLANT
GLOVE BIO SURGEON STRL SZ 6 (GLOVE) ×12 IMPLANT
GLOVE BIO SURGEON STRL SZ 6.5 (GLOVE) IMPLANT
GLOVE BIO SURGEON STRL SZ7 (GLOVE) ×2 IMPLANT
GLOVE BIO SURGEONS STRL SZ 6.5 (GLOVE)
GLOVE BIOGEL PI IND STRL 6.5 (GLOVE) IMPLANT
GLOVE BIOGEL PI IND STRL 7.0 (GLOVE) IMPLANT
GLOVE BIOGEL PI IND STRL 7.5 (GLOVE) IMPLANT
GLOVE BIOGEL PI INDICATOR 6.5 (GLOVE) ×2
GLOVE BIOGEL PI INDICATOR 7.0 (GLOVE) ×2
GLOVE BIOGEL PI INDICATOR 7.5 (GLOVE) ×2
GLOVE ECLIPSE 7.0 STRL STRAW (GLOVE) ×4 IMPLANT
GOWN STRL REUS W/ TWL LRG LVL3 (GOWN DISPOSABLE) ×4 IMPLANT
GOWN STRL REUS W/TWL LRG LVL3 (GOWN DISPOSABLE) ×8
KIT FILL SYSTEM UNIVERSAL (SET/KITS/TRAYS/PACK) IMPLANT
MARKER SKIN DUAL TIP RULER LAB (MISCELLANEOUS) IMPLANT
NDL HYPO 25X1 1.5 SAFETY (NEEDLE) IMPLANT
NDL HYPO 30GX1 BEV (NEEDLE) IMPLANT
NDL PRECISIONGLIDE 27X1.5 (NEEDLE) ×2 IMPLANT
NDL SAFETY ECLIPSE 18X1.5 (NEEDLE) ×2 IMPLANT
NEEDLE HYPO 18GX1.5 SHARP (NEEDLE)
NEEDLE HYPO 25X1 1.5 SAFETY (NEEDLE) IMPLANT
NEEDLE HYPO 30GX1 BEV (NEEDLE) IMPLANT
NEEDLE PRECISIONGLIDE 27X1.5 (NEEDLE) IMPLANT
NS IRRIG 1000ML POUR BTL (IV SOLUTION) IMPLANT
PENCIL SMOKE EVACUATOR (MISCELLANEOUS) ×4 IMPLANT
PIN SAFETY STERILE (MISCELLANEOUS) ×2 IMPLANT
PUNCH BIOPSY DERMAL 4MM (MISCELLANEOUS) IMPLANT
SET BASIN DAY SURGERY F.S. (CUSTOM PROCEDURE TRAY) ×4 IMPLANT
SHEET MEDIUM DRAPE 40X70 STRL (DRAPES) ×6 IMPLANT
SPONGE GAUZE 2X2 8PLY STER LF (GAUZE/BANDAGES/DRESSINGS)
SPONGE GAUZE 2X2 8PLY STRL LF (GAUZE/BANDAGES/DRESSINGS) IMPLANT
SPONGE LAP 18X18 RF (DISPOSABLE) ×8 IMPLANT
STAPLER VISISTAT 35W (STAPLE) ×4 IMPLANT
STRIP CLOSURE SKIN 1/2X4 (GAUZE/BANDAGES/DRESSINGS) IMPLANT
SUT CHROMIC 4 0 PS 2 18 (SUTURE) ×6 IMPLANT
SUT ETHILON 2 0 FS 18 (SUTURE) ×4 IMPLANT
SUT MNCRL AB 4-0 PS2 18 (SUTURE) ×6 IMPLANT
SUT PDS AB 2-0 CT2 27 (SUTURE) IMPLANT
SUT SILK 2 0 SH (SUTURE) IMPLANT
SUT VIC AB 3-0 SH 27 (SUTURE) ×4
SUT VIC AB 3-0 SH 27X BRD (SUTURE) IMPLANT
SUT VICRYL 0 CT-2 (SUTURE) ×2 IMPLANT
SUT VICRYL 4-0 PS2 18IN ABS (SUTURE) ×4 IMPLANT
SUT VLOC 180 0 24IN GS25 (SUTURE) ×2 IMPLANT
SYR 10ML LL (SYRINGE) ×4 IMPLANT
SYR 50ML LL SCALE MARK (SYRINGE) ×4 IMPLANT
SYR BULB EAR ULCER 3OZ GRN STR (SYRINGE) IMPLANT
SYR BULB IRRIGATION 50ML (SYRINGE) ×6 IMPLANT
SYR CONTROL 10ML LL (SYRINGE) ×2 IMPLANT
TAPE MEASURE VINYL STERILE (MISCELLANEOUS) IMPLANT
TISSUE EXPNDR FV FOURTE 400 (Prosthesis & Implant Plastic) ×4 IMPLANT
TOWEL GREEN STERILE FF (TOWEL DISPOSABLE) ×4 IMPLANT
TRAY DSU PREP LF (CUSTOM PROCEDURE TRAY) ×2 IMPLANT
TRAY FOLEY W/BAG SLVR 14FR LF (SET/KITS/TRAYS/PACK) IMPLANT
TUBE CONNECTING 20'X1/4 (TUBING) ×2
TUBE CONNECTING 20X1/4 (TUBING) ×2 IMPLANT
UNDERPAD 30X36 HEAVY ABSORB (UNDERPADS AND DIAPERS) ×8 IMPLANT
YANKAUER SUCT BULB TIP NO VENT (SUCTIONS) ×4 IMPLANT

## 2020-01-24 NOTE — Interval H&P Note (Signed)
History and Physical Interval Note:  01/24/2020 6:59 AM  Alexandria Bradley  has presented today for surgery, with the diagnosis of history breast ca, acquired absence breasts.  The various methods of treatment have been discussed with the patient and family. After consideration of risks, benefits and other options for treatment, the patient has consented to  Procedure(s): BREAST RECONSTRUCTION WITH PLACEMENT OF TISSUE EXPANDER AND ALLODERM (Right) REMOVAL PORT-A-CATH (Right) as a surgical intervention.  The patient's history has been reviewed, patient examined, no change in status, stable for surgery.  I have reviewed the patient's chart and labs.  Questions were answered to the patient's satisfaction.     Arnoldo Hooker Yunis Voorheis

## 2020-01-24 NOTE — Discharge Instructions (Signed)
° ° ° °  JP Drain Totals °· Bring this sheet to all of your post-operative appointments while you have your drains. °· Please measure your drains by CC's or ML's. °· Make sure you drain and measure your JP Drains 2 or 3 times per day. °· At the end of each day, add up totals for the left side and add up totals for the right side. °   ( 9 am )     ( 3 pm )        ( 9 pm )                °Date L  R  L  R  L  R  Total L/R  °               °               °               °               °               °               °               °               °               °               °               °               ° ° ° °Post Anesthesia Home Care Instructions ° °Activity: °Get plenty of rest for the remainder of the day. A responsible individual must stay with you for 24 hours following the procedure.  °For the next 24 hours, DO NOT: °-Drive a car °-Operate machinery °-Drink alcoholic beverages °-Take any medication unless instructed by your physician °-Make any legal decisions or sign important papers. ° °Meals: °Start with liquid foods such as gelatin or soup. Progress to regular foods as tolerated. Avoid greasy, spicy, heavy foods. If nausea and/or vomiting occur, drink only clear liquids until the nausea and/or vomiting subsides. Call your physician if vomiting continues. ° °Special Instructions/Symptoms: °Your throat may feel dry or sore from the anesthesia or the breathing tube placed in your throat during surgery. If this causes discomfort, gargle with warm salt water. The discomfort should disappear within 24 hours. ° °If you had a scopolamine patch placed behind your ear for the management of post- operative nausea and/or vomiting: ° °1. The medication in the patch is effective for 72 hours, after which it should be removed.  Wrap patch in a tissue and discard in the trash. Wash hands thoroughly with soap and water. °2. You may remove the patch earlier than 72 hours if you experience unpleasant side effects which  may include dry mouth, dizziness or visual disturbances. °3. Avoid touching the patch. Wash your hands with soap and water after contact with the patch. °  ° °

## 2020-01-24 NOTE — Op Note (Signed)
Operative Note   DATE OF OPERATION: 4.23.2021  LOCATION:  [facility]  SURGICAL DIVISION: Plastic Surgery  PREOPERATIVE DIAGNOSES:  1. History breast cancer 2. Acquired absence breast 3. History therapeutic radiation  POSTOPERATIVE DIAGNOSES:  same  PROCEDURE:  1. Right breast reconstruction with tissue expander 2. Acellular dermis (Alloderm) to right chest wall 300 cm2 3. Removal right chest port  SURGEON: Irene Limbo MD MBA  ASSISTANT: none  ANESTHESIA:  General.   EBL: 30 ml  COMPLICATIONS: None immediate.   INDICATIONS FOR PROCEDURE:  The patient, Alexandria Bradley, is a 59 y.o. female born on 06-Sep-1961, is here for right breast reconstruction following skin reduction pattern mastectomy. Patient initially underwent immediate reconstruction with course complicated by fever and open wound and subsequent removal.   FINDINGS: Natrelle 133S-FV-12T 400 ml tissue expander placed, initial fill volume 220 ml saline SN [id number]  DESCRIPTION OF PROCEDURE:  The patient's operative site was marked with the patient in the preoperative area. The patient was taken to the operating room. SCDs were placed and IV antibiotics were given. The patient's operative site was prepped and draped in a sterile fashion. A time out was performed and all information was confirmed to be correct. Incision made over right chest port scar and carried through superficial fascia and capsule. Sutures removed and port removed intact. Closure completed with 4-0 vicryl in superficial fascia and dermis, 4-0 monocryl subcuticular skin closure.  Incision made in right vertical and medial half inframammary fold scar and carried through superficial fascia to anterior surface pectoralis muscle. Dissection completed over pectoralis muscle to dimensions of expander. The cavity was irrigated with solution containingAncef,bacitracin, and gentamicin. Hemostasis was ensured. A 19 Fr drain was placed in  subcutaneous position laterally anda 15 Fr drain placed along inframammary fold. Eachsecured to skin with 2-0 nylon. Cavity irrigated with Betadine saline solution.The tissue expanderwasprepared on back table prior in insertion. The expander was filled with air.Perforated acellular dermis was draped over anterior surface expander. The ADM was then secured to itself over posterior surface of expanderwith 4-0 chromic. Redundant folds acellular dermis excised so that the ADM layflat without folds over air filled expander.The expander was secured to medial insertion pectoralis with a 0 vicryl suture.The lateral tab also secured to serratus muscle with 2-0 PDS. The ADM was secured to pectoralis muscle and chest wall along inframammary foldwith 0 V lock suture. Skin closure completedwith 3-0 vicryl in fascial layer and 4-0 vicryl in dermis. Skin closure completed with 4-0 monocryl subcuticular and tissue adhesive. The expander port was accessed and all air removed. Saline used to fill expander to initial fill volume 220 ml. Dry dressing and breast binder applied.  The patient was allowed to wake from anesthesia, extubated and taken to the recovery room in satisfactory condition.   SPECIMENS: none  DRAINS: 15 and 19 Fr JP in subcutaneous right chest

## 2020-01-24 NOTE — Anesthesia Procedure Notes (Signed)
Procedure Name: LMA Insertion Date/Time: 01/24/2020 7:25 AM Performed by: Maryella Shivers, CRNA Pre-anesthesia Checklist: Patient identified, Emergency Drugs available, Suction available and Patient being monitored Patient Re-evaluated:Patient Re-evaluated prior to induction Oxygen Delivery Method: Circle system utilized Preoxygenation: Pre-oxygenation with 100% oxygen Induction Type: IV induction Ventilation: Mask ventilation without difficulty LMA: LMA inserted LMA Size: 4.0 Number of attempts: 1 Airway Equipment and Method: Bite block Placement Confirmation: positive ETCO2 Tube secured with: Tape Dental Injury: Teeth and Oropharynx as per pre-operative assessment

## 2020-01-24 NOTE — Transfer of Care (Signed)
Immediate Anesthesia Transfer of Care Note  Patient: Alexandria Bradley  Procedure(s) Performed: BREAST RECONSTRUCTION WITH PLACEMENT OF TISSUE EXPANDER AND ALLODERM (Right Breast) REMOVAL PORT-A-CATH (Right Chest)  Patient Location: PACU  Anesthesia Type:General  Level of Consciousness: sedated  Airway & Oxygen Therapy: Patient Spontanous Breathing and Patient connected to face mask oxygen  Post-op Assessment: Report given to RN and Post -op Vital signs reviewed and stable  Post vital signs: Reviewed and stable  Last Vitals:  Vitals Value Taken Time  BP 136/71 01/24/20 0931  Temp    Pulse 78 01/24/20 0934  Resp 20 01/24/20 0934  SpO2 99 % 01/24/20 0934  Vitals shown include unvalidated device data.  Last Pain:  Vitals:   01/24/20 0636  TempSrc: Temporal  PainSc: 0-No pain      Patients Stated Pain Goal: 3 ([id number])  Complications: No apparent anesthesia complications

## 2020-01-24 NOTE — Anesthesia Postprocedure Evaluation (Signed)
Anesthesia Post Note  Patient: KAIRO EARHART  Procedure(s) Performed: BREAST RECONSTRUCTION WITH PLACEMENT OF TISSUE EXPANDER AND ALLODERM (Right Breast) REMOVAL PORT-A-CATH (Right Chest)     Patient location during evaluation: PACU Anesthesia Type: General Level of consciousness: awake and alert and oriented Pain management: pain level controlled Vital Signs Assessment: post-procedure vital signs reviewed and stable Respiratory status: spontaneous breathing, nonlabored ventilation and respiratory function stable Cardiovascular status: blood pressure returned to baseline and stable Postop Assessment: no apparent nausea or vomiting Anesthetic complications: no    Last Vitals:  Vitals:   01/24/20 1015 01/24/20 1030  BP: 119/68 120/65  Pulse: 74 71  Resp: 11 14  Temp:    SpO2: 97% 99%    Last Pain:  Vitals:   01/24/20 1030  TempSrc:   PainSc: Asleep                 Zeplin Aleshire A.

## 2020-01-25 ENCOUNTER — Encounter: Payer: Self-pay | Admitting: *Deleted

## 2020-02-04 ENCOUNTER — Other Ambulatory Visit: Payer: Self-pay | Admitting: Nurse Practitioner

## 2020-02-14 ENCOUNTER — Ambulatory Visit: Payer: 59 | Attending: Internal Medicine

## 2020-02-14 ENCOUNTER — Encounter: Payer: Self-pay | Admitting: Gastroenterology

## 2020-02-14 ENCOUNTER — Ambulatory Visit (INDEPENDENT_AMBULATORY_CARE_PROVIDER_SITE_OTHER): Payer: 59 | Admitting: Gastroenterology

## 2020-02-14 VITALS — BP 120/70 | HR 68 | Temp 97.9°F | Ht 65.0 in | Wt 192.8 lb

## 2020-02-14 DIAGNOSIS — Z86010 Personal history of colonic polyps: Secondary | ICD-10-CM | POA: Diagnosis not present

## 2020-02-14 DIAGNOSIS — Z23 Encounter for immunization: Secondary | ICD-10-CM

## 2020-02-14 DIAGNOSIS — Z860101 Personal history of adenomatous and serrated colon polyps: Secondary | ICD-10-CM

## 2020-02-14 DIAGNOSIS — R197 Diarrhea, unspecified: Secondary | ICD-10-CM | POA: Diagnosis not present

## 2020-02-14 MED ORDER — SUTAB 1479-225-188 MG PO TABS: 1.0000 | ORAL_TABLET | ORAL | 0 refills | Status: DC

## 2020-02-14 MED ORDER — GLYCOPYRROLATE 2 MG PO TABS
2.0000 mg | ORAL_TABLET | Freq: Two times a day (BID) | ORAL | 11 refills | Status: DC
Start: 2020-02-14 — End: 2020-08-04

## 2020-02-14 NOTE — Patient Instructions (Signed)
We have sent the following medications to your pharmacy for you to pick up at your convenience: glycopyrrolate and Sutab.  You have been scheduled for an endoscopy and colonoscopy. Please follow the written instructions given to you at your visit today. Please pick up your prep supplies at the pharmacy within the next 1-3 days. If you use inhalers (even only as needed), please bring them with you on the day of your procedure.  Normal BMI (Body Mass Index- based on height and weight) is between 19 and 25. Your BMI today is Body mass index is 32.08 kg/m. Marland Kitchen Please consider follow up  regarding your BMI with your Primary Care Provider.  Thank you for choosing me and [facility].  Pricilla Riffle. Dagoberto Ligas., MD., Marval Regal

## 2020-02-14 NOTE — Progress Notes (Signed)
   Covid-19 Vaccination Clinic  Name:  AYANIA WHITEHAIR    MRN: [id number] DOB: Oct 16, 1960  02/14/2020  Ms. Percy was observed post Covid-19 immunization for 15 minutes without incident. She was provided with Vaccine Information Sheet and instruction to access the V-Safe system.   Ms. Perrault was instructed to call 911 with any severe reactions post vaccine: Marland Kitchen Difficulty breathing  . Swelling of face and throat  . A fast heartbeat  . A bad rash all over body  . Dizziness and weakness   Immunizations Administered    Name Date Dose VIS Date Route   Pfizer COVID-19 Vaccine 02/14/2020 11:44 AM 0.3 mL 11/24/2018 Intramuscular   Manufacturer: [place]   Lot: [id number]   [place]: [id number]

## 2020-02-14 NOTE — Progress Notes (Signed)
History of Present Illness: This is a 59 year female patient formally followed by Dr. Cannon Kettle until 2021.  She has triple negative breast cancer locally recurrent status post bilateral mastectomies and is being followed by Dr. Jana Hakim.  She has had chronic problems with diarrhea for several months.  She relates a history of an ulcer diagnosed many years ago.  She was treated with Keytruda in 2020 which did not impact her diarrhea.  This medication was taken from September 202o to December 2020 and then was discontinued.  She has tried Questran, Imodium and Lomotil for chronic diarrhea, abdominal cramping and bloating with only mixed success.  She states she has 2-6 loose nonbloody bowel movements per day.  She has occasional reflux symptoms and nausea.  Colonoscopy performed in 2015 by [name] was negative.  Unfortunately I do not have copy of her prior EGD report or any other records from [name].  Recent CMP showed a minimally elevated AST which has been present on several prior studies.  CBC showed a mildly increased MCV at 101.1.  Most recent TSH was normal previously her TSH had been minimally elevated.  On April 23 she had a right breast reconstruction with tissue expander, acellular dermis to the right chest wall and right chest port removal. Denies weight loss, constipation, change in stool caliber, melena, hematochezia, nausea, vomiting, dysphagia, chest pain.     Allergies  Allergen Reactions  . Chlorhexidine Itching  . Ciprofloxacin Anxiety, Rash and Other (See Comments)    GI upset  . Prednisone Shortness Of Breath and Other (See Comments)    "jacks me up" jittery   . Codeine Nausea Only  . Cortisone Swelling and Other (See Comments)    "jack me up"    Outpatient Medications Prior to Visit  Medication Sig Dispense Refill  . augmented betamethasone dipropionate (DIPROLENE-AF) 0.05 % cream Apply 1 application topically 2 (two) times daily as needed.    . calcium  carbonate (TUMS - DOSED IN MG ELEMENTAL CALCIUM) 500 MG chewable tablet Chew 1 tablet by mouth at bedtime as needed for indigestion or heartburn.    . diltiazem (CARDIZEM CD) 240 MG 24 hr capsule Take 1 capsule (240 mg total) by mouth daily. 90 capsule 3  . fluocinonide (LIDEX) 0.05 % external solution APPLY TO SCALP DAILY    . liothyronine (CYTOMEL) 5 MCG tablet Take 5 mcg by mouth daily before breakfast.     . potassium chloride (KLOR-CON) 10 MEQ tablet Take 1 tablet (10 mEq total) by mouth daily. 180 tablet 1  . propranolol (INDERAL) 10 MG tablet Take 10 mg by mouth daily as needed (for heart palpitations).     . Saccharomyces boulardii (PROBIOTIC) 250 MG CAPS Take by mouth. 60 capsule   . TIROSINT 137 MCG CAPS Take 1 capsule by mouth every morning.    . triamcinolone cream (KENALOG) 0.1 % APPLY TO AFFECTED AREA TWICE A DAY *MAX ON INSURANCE    . triamterene-hydrochlorothiazide (MAXZIDE-25) 37.5-25 MG tablet TAKE 1 TABLET BY MOUTH EVERY DAY 90 tablet 3  . cholestyramine (QUESTRAN) 4 g packet Take 1 packet by mouth 2 (two) times daily.    . cholestyramine (QUESTRAN) 4 GM/DOSE powder TAKE 1 PACKET (4 G TOTAL) BY MOUTH 2 (TWO) TIMES DAILY WITH A MEAL. 378 g 1  . loperamide (IMODIUM) 2 MG capsule Take by mouth as needed for diarrhea or loose stools. 30 capsule 0   No facility-administered medications prior to visit.  Past Medical History:  Diagnosis Date  . Anxiety   . Arthritis   . Back pain   . Breast cancer ([place])    left  . Fatty liver   . GERD (gastroesophageal reflux disease)   . Headache(784.0)    PMH : migraines  . History of kidney stones   . Hypercholesterolemia   . Hypertension   . Hypothyroidism   . Kidney stones   . Osteoporosis   . Personal history of chemotherapy   . Personal history of radiation therapy   . PONV (postoperative nausea and vomiting)    difficult to wake up  . Pre-diabetes   . Psoriasis   . PVC's (premature ventricular contractions)   . Radiation  08/18/14-10/07/14   Left breast   Past Surgical History:  Procedure Laterality Date  . BREAST BIOPSY Left 11/10/2018  . BREAST LUMPECTOMY Left 2015  . BREAST LUMPECTOMY WITH AXILLARY LYMPH NODE BIOPSY  6/15   left  . BREAST RECONSTRUCTION WITH PLACEMENT OF TISSUE EXPANDER AND ALLODERM Bilateral 04/12/2019   Procedure: BILATERAL BREAST RECONSTRUCTION WITH PLACEMENT OF TISSUE EXPANDERS; ALLODERM TO RIGHT CHEST;  Surgeon: Irene Limbo, MD;  Location: [place];  Service: Plastics;  Laterality: Bilateral;  . BREAST RECONSTRUCTION WITH PLACEMENT OF TISSUE EXPANDER AND ALLODERM Right 01/24/2020   Procedure: BREAST RECONSTRUCTION WITH PLACEMENT OF TISSUE EXPANDER AND ALLODERM;  Surgeon: Irene Limbo, MD;  Location: [place];  Service: Plastics;  Laterality: Right;  . CARDIAC CATHETERIZATION    . CHOLECYSTECTOMY    . colon polyps    . COLONOSCOPY N/A 06/27/2014   Procedure: COLONOSCOPY;  Surgeon: Lear Ng, MD;  Location[facility] ENDOSCOPY;  Service: Endoscopy;  Laterality: N/A;  . LATISSIMUS FLAP TO BREAST Left 04/12/2019   Procedure: LEFT LATISSIMUS DORSI FLAP TO LEFT CHEST;  Surgeon: Irene Limbo, MD;  Location: [place];  Service: Plastics;  Laterality: Left;  . LEFT HEART CATH AND CORONARY ANGIOGRAPHY N/A 07/24/2018   Procedure: LEFT HEART CATH AND CORONARY ANGIOGRAPHY;  Surgeon: Troy Sine, MD;  Location: [place] CV [facility];  Service: Cardiovascular;  Laterality: N/A;  . LIPOMA EXCISION    . MASTECTOMY W/ SENTINEL NODE BIOPSY Bilateral 04/12/2019   Procedure: RIGHT BREAST RISK REDUCING MASTECTOMY; LEFT MASTECTOMY WITH LEFT AXILLARY SENTINEL LYMPH NODE BIOPSY WITH BLUE DYE INJECTION;  Surgeon: [name], MD;  Location: [place];  Service: General;  Laterality: Bilateral;  . MYOMECTOMY    . PORT-A-CATH REMOVAL N/A 02/03/2015   Procedure: REMOVAL PORT-A-CATH;  Surgeon: [name], MD;  Location: [place];  Service: General;  Laterality:  N/A;  . PORT-A-CATH REMOVAL Right 01/24/2020   Procedure: REMOVAL PORT-A-CATH;  Surgeon: Irene Limbo, MD;  Location: [place];  Service: Plastics;  Laterality: Right;  . PORTACATH PLACEMENT N/A 03/01/2014   Procedure: INSERTION PORT-A-CATH;  Surgeon: [name], MD;  Location: [place];  Service: General;  Laterality: N/A;  . PORTACATH PLACEMENT N/A 11/18/2018   Procedure: INSERTION PORT-A-CATH WITH ULTRASOUND;  Surgeon: [name], MD;  Location: [place];  Service: General;  Laterality: N/A;  . REMOVAL OF TISSUE EXPANDER AND PLACEMENT OF IMPLANT Right 06/02/2019   Procedure: REMOVAL OF TISSUE EXPANDER RIGHT CHEST;  Surgeon: Irene Limbo, MD;  Location: [place];  Service: Plastics;  Laterality: Right;  . TONSILLECTOMY    . TUBAL LIGATION    . WOUND DEBRIDEMENT Left 06/02/2019   Procedure: LEFT CHEST DEBRIDEMENT;  Surgeon: Irene Limbo, MD;  Location: [facility]  [place];  Service: Plastics;  Laterality: Left;   Social History   Socioeconomic History  . Marital status: Married    Spouse name: Not on file  . Number of children: 3  . Years of education: Not on file  . Highest education level: Not on file  Occupational History  . Not on file  Tobacco Use  . Smoking status: Former Smoker    Packs/day: 0.50    Years: 15.00    Pack years: 7.50    Types: Cigarettes  . Smokeless tobacco: Never Used  . Tobacco comment: Quit smoking cigarettes in 2002  Substance and Sexual Activity  . Alcohol use: Yes    Comment: social  . Drug use: No  . Sexual activity: Not on file  Other Topics Concern  . Not on file  Social History Narrative  . Not on file   Social Determinants of Health   Financial Resource Strain:   . Difficulty of Paying Living Expenses:   Food Insecurity:   . Worried About Charity fundraiser in the Last Year:   . Arboriculturist in the Last Year:   Transportation Needs:   . Lexicographer (Medical):   Marland Kitchen Lack of Transportation (Non-Medical):   Physical Activity:   . Days of Exercise per Week:   . Minutes of Exercise per Session:   Stress:   . Feeling of Stress :   Social Connections:   . Frequency of Communication with Friends and Family:   . Frequency of Social Gatherings with Friends and Family:   . Attends Religious Services:   . Active Member of Clubs or Organizations:   . Attends Archivist Meetings:   Marland Kitchen Marital Status:    Family History  Problem Relation Age of Onset  . Endometrial cancer Mother 46  . Hearing loss Mother   . Atrial fibrillation Mother   . Lung cancer Father   . Brain cancer Father   . Heart disease Maternal Aunt   . Atrial fibrillation Maternal Aunt   . Lung cancer Maternal Uncle   . Atrial fibrillation Maternal Uncle   . Stroke Maternal Grandmother   . Stroke Maternal Grandfather   . Bladder Cancer Maternal Uncle   . Multiple myeloma Maternal Uncle   . Other Son        trisomy 25  . Colon polyps Cousin   . Colon cancer Neg Hx     Current Medications, Allergies, Past Medical History, Past Surgical History, Family History and Social History were reviewed in [employer] record.  ROS: Negative, otherwise as per HPI   Physical Exam: General: Well developed, well nourished, no acute distress Head: Normocephalic and atraumatic Eyes:  sclerae anicteric, EOMI Ears: Normal auditory acuity Mouth: Not examined, mask on during Covid-19 pandemic Lungs: Clear throughout to auscultation Heart: Regular rate and rhythm; no murmurs, rubs or bruits Abdomen: Soft, non tender and non distended. No masses, hepatosplenomegaly or hernias noted. Normal Bowel sounds Rectal: Deferred to colonoscopy  Musculoskeletal: Symmetrical with no gross deformities  Pulses:  Normal pulses noted Extremities: No clubbing, cyanosis, edema or deformities noted Neurological: Alert oriented x 4, grossly  nonfocal Psychological:  Alert and cooperative. Normal mood and affect    Assessment and Recommendations:  1. Persistent diarrhea, abdominal cramping, abdominal bloating.  History of colon adenomaotous polyp.  Rule out microscopic colitis, IBD.  Begin glycopyrrolate 2 mg p.o. twice daily.  Continue Imodium 3 times daily as needed.  Avoid foods that trigger symptoms.  Schedule colonoscopy. The risks (including bleeding, perforation, infection, missed lesions, medication reactions and possible hospitalization or surgery if complications occur), benefits, and alternatives to colonoscopy with possible biopsy and possible polypectomy were discussed with the patient and they consent to proceed. Request records from Dr. Kathline Magic office.    2.  GERD, nausea, history of ulcer disease. R/O ulcer, esophagitis, celiac disease, etc. Schedule EGD. The risks (including bleeding, perforation, infection, missed lesions, medication reactions and possible hospitalization or surgery if complications occur), benefits, and alternatives to endoscopy with possible biopsy and possible dilation were discussed with the patient and they consent to proceed.

## 2020-02-29 HISTORY — PX: COLONOSCOPY: SHX174

## 2020-03-06 ENCOUNTER — Ambulatory Visit: Payer: No Typology Code available for payment source | Attending: Internal Medicine

## 2020-03-06 DIAGNOSIS — Z23 Encounter for immunization: Secondary | ICD-10-CM

## 2020-03-06 NOTE — Progress Notes (Signed)
   Covid-19 Vaccination Clinic  Name:  Alexandria Bradley    MRN: [id number] DOB: [birth year]  03/06/2020  [name] was observed post Covid-19 immunization for 15 minutes without incident. She was provided with Vaccine Information Sheet and instruction to access the V-Safe system.   [name] was instructed to call 911 with any severe reactions post vaccine: Marland Kitchen Difficulty breathing  . Swelling of face and throat  . A fast heartbeat  . A bad rash all over body  . Dizziness and weakness   Immunizations Administered    Name Date Dose VIS Date Route   Pfizer COVID-19 Vaccine 03/06/2020 12:23 PM 0.3 mL 11/24/2018 Intramuscular   Manufacturer: [place]   Lot: [id number]   [place]: [id number]

## 2020-03-12 ENCOUNTER — Encounter (HOSPITAL_BASED_OUTPATIENT_CLINIC_OR_DEPARTMENT_OTHER): Payer: Self-pay

## 2020-03-12 ENCOUNTER — Emergency Department (HOSPITAL_BASED_OUTPATIENT_CLINIC_OR_DEPARTMENT_OTHER): Payer: No Typology Code available for payment source

## 2020-03-12 ENCOUNTER — Emergency Department (HOSPITAL_BASED_OUTPATIENT_CLINIC_OR_DEPARTMENT_OTHER)
Admission: EM | Admit: 2020-03-12 | Discharge: 2020-03-12 | Disposition: A | Discharge status: Home / Self Care | Payer: No Typology Code available for payment source | Attending: Emergency Medicine | Admitting: Emergency Medicine

## 2020-03-12 ENCOUNTER — Other Ambulatory Visit: Payer: Self-pay

## 2020-03-12 DIAGNOSIS — E876 Hypokalemia: Secondary | ICD-10-CM | POA: Diagnosis not present

## 2020-03-12 DIAGNOSIS — E039 Hypothyroidism, unspecified: Secondary | ICD-10-CM | POA: Diagnosis not present

## 2020-03-12 DIAGNOSIS — R1031 Right lower quadrant pain: Secondary | ICD-10-CM | POA: Diagnosis present

## 2020-03-12 DIAGNOSIS — I10 Essential (primary) hypertension: Secondary | ICD-10-CM | POA: Diagnosis not present

## 2020-03-12 DIAGNOSIS — K388 Other specified diseases of appendix: Secondary | ICD-10-CM | POA: Insufficient documentation

## 2020-03-12 DIAGNOSIS — Z79899 Other long term (current) drug therapy: Secondary | ICD-10-CM | POA: Insufficient documentation

## 2020-03-12 DIAGNOSIS — Z87891 Personal history of nicotine dependence: Secondary | ICD-10-CM | POA: Diagnosis not present

## 2020-03-12 DIAGNOSIS — K6389 Other specified diseases of intestine: Secondary | ICD-10-CM

## 2020-03-12 LAB — CBC WITH DIFFERENTIAL/PLATELET
Abs Immature Granulocytes: 0.04 10*3/uL (ref 0.00–0.07)
Basophils Absolute: 0.1 10*3/uL (ref 0.0–0.1)
Basophils Relative: 1 %
Eosinophils Absolute: 0.2 10*3/uL (ref 0.0–0.5)
Eosinophils Relative: 3 %
HCT: 39.6 % (ref 36.0–46.0)
Hemoglobin: 13.2 g/dL (ref 12.0–15.0)
Immature Granulocytes: 1 %
Lymphocytes Relative: 20 %
Lymphs Abs: 1.7 10*3/uL (ref 0.7–4.0)
MCH: 31.6 pg (ref 26.0–34.0)
MCHC: 33.3 g/dL (ref 30.0–36.0)
MCV: 94.7 fL (ref 80.0–100.0)
Monocytes Absolute: 0.8 10*3/uL (ref 0.1–1.0)
Monocytes Relative: 9 %
Neutro Abs: 5.7 10*3/uL (ref 1.7–7.7)
Neutrophils Relative %: 66 %
Platelets: 279 10*3/uL (ref 150–400)
RBC: 4.18 MIL/uL (ref 3.87–5.11)
RDW: 13 % (ref 11.5–15.5)
WBC: 8.5 10*3/uL (ref 4.0–10.5)
nRBC: 0 % (ref 0.0–0.2)

## 2020-03-12 LAB — COMPREHENSIVE METABOLIC PANEL
ALT: 36 U/L (ref 0–44)
AST: 49 U/L — ABNORMAL HIGH (ref 15–41)
Albumin: 4.4 g/dL (ref 3.5–5.0)
Alkaline Phosphatase: 64 U/L (ref 38–126)
Anion gap: 13 (ref 5–15)
BUN: 13 mg/dL (ref 8–23)
CO2: 22 mmol/L (ref 22–32)
Calcium: 9.7 mg/dL (ref 8.9–10.3)
Chloride: 102 mmol/L (ref 98–111)
Creatinine, Ser: 0.74 mg/dL (ref 0.44–1.00)
GFR calc Af Amer: >60 mL/min (ref >60)
GFR calc non Af Amer: >60 mL/min (ref >60)
Glucose, Bld: 115 mg/dL — ABNORMAL HIGH (ref 70–99)
Potassium: 3.1 mmol/L — ABNORMAL LOW (ref 3.5–5.1)
Sodium: 137 mmol/L (ref 135–145)
Total Bilirubin: 0.6 mg/dL (ref 0.3–1.2)
Total Protein: 8.1 g/dL (ref 6.5–8.1)

## 2020-03-12 LAB — URINALYSIS, ROUTINE W REFLEX MICROSCOPIC
Bilirubin Urine: NEGATIVE
Glucose, UA: NEGATIVE mg/dL
Hgb urine dipstick: NEGATIVE
Ketones, ur: NEGATIVE mg/dL
Leukocytes,Ua: NEGATIVE
Nitrite: NEGATIVE
Protein, ur: NEGATIVE mg/dL
Specific Gravity, Urine: 1.01 (ref 1.005–1.030)
pH: 6.5 (ref 5.0–8.0)

## 2020-03-12 LAB — LIPASE, BLOOD: Lipase: 28 U/L (ref 11–51)

## 2020-03-12 MED ORDER — IOHEXOL 300 MG/ML  SOLN
100.0000 mL | Freq: Once | INTRAMUSCULAR | Status: AC | PRN
  Administered 2020-03-12: 100 mL via INTRAVENOUS

## 2020-03-12 MED ORDER — SODIUM CHLORIDE 0.9 % IV BOLUS
1000.0000 mL | Freq: Once | INTRAVENOUS | Status: AC
  Administered 2020-03-12: 1000 mL via INTRAVENOUS

## 2020-03-12 MED ORDER — MORPHINE SULFATE (PF) 4 MG/ML IV SOLN
4.0000 mg | Freq: Once | INTRAVENOUS | Status: AC
  Administered 2020-03-12: 4 mg via INTRAVENOUS
  Filled 2020-03-12: qty 1

## 2020-03-12 MED ORDER — OXYCODONE HCL 5 MG PO TABS: 5.0000 mg | ORAL_TABLET | Freq: Four times a day (QID) | ORAL | 0 refills | Status: AC | PRN

## 2020-03-12 MED ORDER — POTASSIUM CHLORIDE CRYS ER 20 MEQ PO TBCR
40.0000 meq | EXTENDED_RELEASE_TABLET | Freq: Once | ORAL | Status: AC
  Administered 2020-03-12: 40 meq via ORAL
  Filled 2020-03-12: qty 2

## 2020-03-12 MED ORDER — ONDANSETRON 4 MG PO TBDP
ORAL_TABLET | ORAL | 0 refills | Status: DC
Start: 2020-03-12 — End: 2020-12-26

## 2020-03-12 MED ORDER — ONDANSETRON HCL 4 MG/2ML IJ SOLN
4.0000 mg | Freq: Once | INTRAMUSCULAR | Status: AC
  Administered 2020-03-12: 4 mg via INTRAVENOUS
  Filled 2020-03-12: qty 2

## 2020-03-12 NOTE — ED Notes (Signed)
Patient transported to CT 

## 2020-03-12 NOTE — ED Triage Notes (Signed)
Pt arrives with c/o RLQ pain since Tuesday. Pt also c/o nausea r/t pain.

## 2020-03-12 NOTE — Discharge Instructions (Signed)
Your scan shows epiploic appendagitis, this is an inflammation of small appendages on the outside of your colon, we are not sure exactly what causes it but it usually resolves on its own in 3 to 14 days.  Take Tylenol up to 1000 mg 4 times daily, use prescribed oxycodone as needed for breakthrough pain and Zofran as needed for nausea.  In rare cases this does not improve on its own and people need surgery, if you develop high fevers, significantly worsening pain or your symptoms or not improving after 10 days return to the ED or see your primary care doctor.  Your potassium was a bit low today, please continue taking your potassium tablets at home and have this rechecked by your primary doctor.

## 2020-03-12 NOTE — ED Triage Notes (Signed)
Pt had 2nd Covid vaccine on Monday.

## 2020-03-12 NOTE — ED Provider Notes (Signed)
[facility] EMERGENCY DEPARTMENT Provider Note   CSN: [id number] Arrival date & time: 03/12/20  1213     History Chief Complaint  Patient presents with  . Abdominal Pain    Alexandria Bradley is a 59 y.o. female.  Alexandria Bradley is a 59 y.o. female with a history of breast cancer, s/p bilateral mastectomy, awaiting cosmetic reconstruction, hypertension, hyperlipidemia, hypothyroidism, GERD, peptic ulcer disease, who presents to the ED for evaluation of right lower quadrant abdominal pain.  Patient reports that pain initially started 6 days ago on Tuesday, initially she did not think much of it, thought it may have been related to receiving her second Covid vaccine on Monday, or related to her ovary, called her PCP, and they did not feel that this was likely a side effect from her Covid vaccine.  She tried taking Tylenol at home but pain continued to get worse and was keeping her from sleeping.  She took 1 tablet of Percocet that she had leftover from her previous surgery but even that did not seem to control her pain.  She has been nauseated with poor appetite.  Denies any changes in bowels, no blood in her stools.  Denies any dysuria or urinary frequency, no flank pain.  No vaginal bleeding or discharge.  Patient is postmenopausal.  Has had previous cholecystectomy but still has her appendix, no other aggravating or alleviating factors.        Past Medical History:  Diagnosis Date  . Anxiety   . Arthritis   . Back pain   . Breast cancer ([place])    left  . Fatty liver   . GERD (gastroesophageal reflux disease)   . Headache(784.0)    PMH : migraines  . History of kidney stones   . Hypercholesterolemia   . Hypertension   . Hypothyroidism   . Kidney stones   . Osteoporosis   . Personal history of chemotherapy   . Personal history of radiation therapy   . PONV (postoperative nausea and vomiting)    difficult to wake up  . Pre-diabetes   . Psoriasis   . PVC's  (premature ventricular contractions)   . Radiation 08/18/14-10/07/14   Left breast    Patient Active Problem List   Diagnosis Date Noted  . Port-A-Cath in place 11/26/2018  . Recurrent cancer of left breast ([place]) 11/13/2018  . Neuropathy due to chemotherapeutic drug ([place]) 11/13/2018  . Chest tightness   . Vertigo 05/28/2018  . Prediabetes 05/28/2018  . OSA (obstructive sleep apnea) 08/20/2016  . Snoring 08/20/2016  . Adjustment disorder with mixed anxiety and depressed mood 08/20/2016  . Genetic testing 07/07/2015  . Family history of uterine cancer   . Family history of bladder cancer   . Abnormal AST and ALT 07/08/2014  . Fatty infiltration of liver 07/08/2014  . RUQ pain 06/27/2014  . Nonspecific (abnormal) findings on radiological and other examination of gastrointestinal tract 06/27/2014  . Fever 06/15/2014  . Diarrhea 06/10/2014  . Chemotherapy-induced nausea 06/10/2014  . Abdominal pain 06/10/2014  . Drug induced neutropenia(288.03) 04/21/2014  . Malignant neoplasm of upper-inner quadrant of left breast in female, estrogen receptor negative ([place]) 02/11/2014  . OTHER CHEST PAIN 05/21/2010  . HYPERLIPIDEMIA 05/11/2010  . PSORIASIS 05/11/2010  . PALPITATIONS 05/11/2010  . Nonspecific (abnormal) findings on radiological and other examination of body structure 05/11/2010  . CHEST XRAY, ABNORMAL 05/11/2010  . Hypothyroidism 05/10/2010  . EPIGASTRIC PAIN 05/10/2010  . CONJUNCTIVITIS, ALLERGIC 02/18/2008  . MALAISE  AND FATIGUE 01/11/2008  . Essential hypertension 09/02/2007  . SINUSITIS- ACUTE-NOS 09/02/2007  . Acute upper respiratory infection 09/02/2007  . Allergic rhinitis, cause unspecified 09/02/2007    Past Surgical History:  Procedure Laterality Date  . BREAST BIOPSY Left 11/10/2018  . BREAST LUMPECTOMY Left 2015  . BREAST LUMPECTOMY WITH AXILLARY LYMPH NODE BIOPSY  6/15   left  . BREAST RECONSTRUCTION WITH PLACEMENT OF TISSUE EXPANDER AND ALLODERM Bilateral  04/12/2019   Procedure: BILATERAL BREAST RECONSTRUCTION WITH PLACEMENT OF TISSUE EXPANDERS; ALLODERM TO RIGHT CHEST;  Surgeon: Irene Limbo, MD;  Location: [facility];  Service: Plastics;  Laterality: Bilateral;  . BREAST RECONSTRUCTION WITH PLACEMENT OF TISSUE EXPANDER AND ALLODERM Right 01/24/2020   Procedure: BREAST RECONSTRUCTION WITH PLACEMENT OF TISSUE EXPANDER AND ALLODERM;  Surgeon: Irene Limbo, MD;  Location: [facility];  Service: Plastics;  Laterality: Right;  . CARDIAC CATHETERIZATION    . CHOLECYSTECTOMY    . colon polyps    . COLONOSCOPY N/A 06/27/2014   Procedure: COLONOSCOPY;  Surgeon: Lear Ng, MD;  Location: [facility] ENDOSCOPY;  Service: Endoscopy;  Laterality: N/A;  . LATISSIMUS FLAP TO BREAST Left 04/12/2019   Procedure: LEFT LATISSIMUS DORSI FLAP TO LEFT CHEST;  Surgeon: Irene Limbo, MD;  Location: [facility];  Service: Plastics;  Laterality: Left;  . LEFT HEART CATH AND CORONARY ANGIOGRAPHY N/A 07/24/2018   Procedure: LEFT HEART CATH AND CORONARY ANGIOGRAPHY;  Surgeon: Troy Sine, MD;  Location: [facility] CV LAB;  Service: Cardiovascular;  Laterality: N/A;  . LIPOMA EXCISION    . MASTECTOMY W/ SENTINEL NODE BIOPSY Bilateral 04/12/2019   Procedure: RIGHT BREAST RISK REDUCING MASTECTOMY; LEFT MASTECTOMY WITH LEFT AXILLARY SENTINEL LYMPH NODE BIOPSY WITH BLUE DYE INJECTION;  Surgeon: [name], MD;  Location: [facility];  Service: General;  Laterality: Bilateral;  . MYOMECTOMY    . PORT-A-CATH REMOVAL N/A 02/03/2015   Procedure: REMOVAL PORT-A-CATH;  Surgeon: [name], MD;  Location: [facility];  Service: General;  Laterality: N/A;  . PORT-A-CATH REMOVAL Right 01/24/2020   Procedure: REMOVAL PORT-A-CATH;  Surgeon: Irene Limbo, MD;  Location: [facility];  Service: Plastics;  Laterality: Right;  . PORTACATH PLACEMENT N/A 03/01/2014   Procedure: INSERTION PORT-A-CATH;  Surgeon: [name], MD;   Location: [facility];  Service: General;  Laterality: N/A;  . PORTACATH PLACEMENT N/A 11/18/2018   Procedure: INSERTION PORT-A-CATH WITH ULTRASOUND;  Surgeon: [name], MD;  Location: [facility];  Service: General;  Laterality: N/A;  . REMOVAL OF TISSUE EXPANDER AND PLACEMENT OF IMPLANT Right 06/02/2019   Procedure: REMOVAL OF TISSUE EXPANDER RIGHT CHEST;  Surgeon: Irene Limbo, MD;  Location: [facility];  Service: Plastics;  Laterality: Right;  . TONSILLECTOMY    . TUBAL LIGATION    . WOUND DEBRIDEMENT Left 06/02/2019   Procedure: LEFT CHEST DEBRIDEMENT;  Surgeon: Irene Limbo, MD;  Location: [facility];  Service: Plastics;  Laterality: Left;     OB History   No obstetric history on file.     Family History  Problem Relation Age of Onset  . Endometrial cancer Mother 60  . Hearing loss Mother   . Atrial fibrillation Mother   . Lung cancer Father   . Brain cancer Father   . Heart disease Maternal Aunt   . Atrial fibrillation Maternal Aunt   . Lung cancer Maternal Uncle   . Atrial fibrillation Maternal Uncle   . Stroke Maternal Grandmother   . Stroke  Maternal Grandfather   . Bladder Cancer Maternal Uncle   . Multiple myeloma Maternal Uncle   . Other Son        trisomy 73  . Colon polyps Cousin   . Colon cancer Neg Hx     Social History   Tobacco Use  . Smoking status: Former Smoker    Packs/day: 0.50    Years: 15.00    Pack years: 7.50    Types: Cigarettes  . Smokeless tobacco: Never Used  . Tobacco comment: Quit smoking cigarettes in 2002  Vaping Use  . Vaping Use: Never used  Substance Use Topics  . Alcohol use: Yes    Comment: social  . Drug use: No    Home Medications Prior to Admission medications   Medication Sig Start Date End Date Taking? Authorizing Provider  augmented betamethasone dipropionate (DIPROLENE-AF) 0.05 % cream Apply 1 application topically 2 (two) times daily as needed. 08/25/19    [provider]  calcium carbonate (TUMS - DOSED IN MG ELEMENTAL CALCIUM) 500 MG chewable tablet Chew 1 tablet by mouth at bedtime as needed for indigestion or heartburn.    [provider]  diltiazem (CARDIZEM CD) 240 MG 24 hr capsule Take 1 capsule (240 mg total) by mouth daily. 02/04/20   Josue Hector, MD  fluocinonide (LIDEX) 0.05 % external solution APPLY TO SCALP DAILY 08/25/19   [provider]  glycopyrrolate (ROBINUL) 2 MG tablet Take 1 tablet (2 mg total) by mouth 2 (two) times daily. 02/14/20   Ladene Artist, MD  liothyronine (CYTOMEL) 5 MCG tablet Take 5 mcg by mouth daily before breakfast.  11/13/13   [provider]  ondansetron (ZOFRAN ODT) 4 MG disintegrating tablet 74m ODT q4 hours prn nausea/vomit 03/12/20   FJacqlyn Larsen PA-C  oxyCODONE (OXY IR/ROXICODONE) 5 MG immediate release tablet Take 1 tablet (5 mg total) by mouth every 6 (six) hours as needed for up to 3 days for severe pain. 03/12/20 03/15/20  FJacqlyn Larsen PA-C  potassium chloride (KLOR-CON) 10 MEQ tablet Take 1 tablet (10 mEq total) by mouth daily. 11/24/19   Magrinat, GVirgie Dad MD  propranolol (INDERAL) 10 MG tablet Take 10 mg by mouth daily as needed (for heart palpitations).  01/16/17   [provider]  Saccharomyces boulardii (PROBIOTIC) 250 MG CAPS Take by mouth. 11/24/19   Magrinat, GVirgie Dad MD  Sodium Sulfate-Mag Sulfate-KCl (SUTAB) 1859-080-4470MG TABS Take 1 kit by mouth as directed. 02/14/20   SLadene Artist MD  TIROSINT 137 MCG CAPS Take 1 capsule by mouth every morning. 11/03/19   [provider]  triamcinolone cream (KENALOG) 0.1 % APPLY TO AFFECTED AREA TWICE A DAY *MAX ON INSURANCE 08/25/19   [provider]  triamterene-hydrochlorothiazide (MAXZIDE-25) 37.5-25 MG tablet TAKE 1 TABLET BY MOUTH EVERY DAY 11/19/19   NJosue Hector MD  prochlorperazine (COMPAZINE) 10 MG tablet Take 1 tablet (10 mg total) by mouth every 6 (six) hours as needed  (Nausea or vomiting). 11/13/18 05/11/19  Magrinat, GVirgie Dad MD    Allergies    Chlorhexidine, Ciprofloxacin, Prednisone, Codeine, and Cortisone  Review of Systems   Review of Systems  Constitutional: Negative for chills and fever.  HENT: Negative.   Respiratory: Negative for cough and shortness of breath.   Cardiovascular: Negative for chest pain.  Gastrointestinal: Positive for abdominal pain, nausea and vomiting. Negative for constipation and diarrhea.  Genitourinary: Negative for dysuria and frequency.  Musculoskeletal: Negative for arthralgias and  myalgias.  Skin: Negative for color change and rash.  Neurological: Negative for dizziness, syncope and light-headedness.  All other systems reviewed and are negative.   Physical Exam Updated Vital Signs BP (!) 142/78 (BP Location: Right Arm)   Pulse 87   Temp 98.5 F (36.9 C) (Oral)   Resp 18   Ht _0  (1.651 m)   Wt 86.2 kg   SpO2 98%   BMI 31.62 kg/m   Physical Exam Vitals and nursing note reviewed.  Constitutional:      General: She is not in acute distress.    Appearance: She is well-developed. She is not diaphoretic.  HENT:     Head: Normocephalic and atraumatic.  Eyes:     General:        Right eye: No discharge.        Left eye: No discharge.     Pupils: Pupils are equal, round, and reactive to light.  Cardiovascular:     Rate and Rhythm: Normal rate and regular rhythm.     Heart sounds: Normal heart sounds.  Pulmonary:     Effort: Pulmonary effort is normal. No respiratory distress.     Breath sounds: Normal breath sounds. No wheezing or rales.     Comments: Respirations equal and unlabored, patient able to speak in full sentences, lungs clear to auscultation bilaterally Abdominal:     General: Bowel sounds are normal. There is no distension.     Palpations: Abdomen is soft. There is no mass.     Tenderness: There is abdominal tenderness in the right lower quadrant. There is guarding.     Comments:  Abdomen soft, nondistended, bowel sounds present throughout, focally tender in the right lower quadrant with some guarding, all other quadrants nontender  Musculoskeletal:        General: No deformity.     Cervical back: Neck supple.  Skin:    General: Skin is warm and dry.     Capillary Refill: Capillary refill takes less than 2 seconds.  Neurological:     Mental Status: She is alert.     Coordination: Coordination normal.     Comments: Speech is clear, able to follow commands Moves extremities without ataxia, coordination intact  Psychiatric:        Mood and Affect: Mood normal.        Behavior: Behavior normal.     ED Results / Procedures / Treatments   Labs (all labs ordered are listed, but only abnormal results are displayed) Labs Reviewed  COMPREHENSIVE METABOLIC PANEL - Abnormal; Notable for the following components:      Result Value   Potassium 3.1 (*)    Glucose, Bld 115 (*)    AST 49 (*)    All other components within normal limits  URINALYSIS, ROUTINE W REFLEX MICROSCOPIC - Abnormal; Notable for the following components:   Color, Urine STRAW (*)    All other components within normal limits  LIPASE, BLOOD  CBC WITH DIFFERENTIAL/PLATELET    EKG None  Radiology CT ABDOMEN PELVIS W CONTRAST  Result Date: 03/12/2020 CLINICAL DATA:  59 year old female with breast cancer recurrence last year. Fever and diarrhea for just under 1 week. EXAM: CT ABDOMEN AND PELVIS WITH CONTRAST TECHNIQUE: Multidetector CT imaging of the abdomen and pelvis was performed using the standard protocol following bolus administration of intravenous contrast. CONTRAST:  119m OMNIPAQUE IOHEXOL 300 MG/ML  SOLN COMPARISON:  CT Abdomen and Pelvis 02/15/2016. FINDINGS: Lower chest: Negative. Hepatobiliary: Regression of hepatic  steatosis since 2017. Liver enhancement is within normal limits. Absent gallbladder as before. Pancreas: Negative. Spleen: Negative. Adrenals/Urinary Tract: Normal adrenal  glands. Bilateral renal enhancement and contrast excretion is symmetric and normal. No nephrolithiasis. Negative ureters. Several pelvic phleboliths greater on the right. Diminutive and unremarkable urinary bladder. Stomach/Bowel: Decompressed and negative rectum and distal sigmoid. Diverticulosis in the descending colon and proximal half of the sigmoid. Best seen on coronal image 33 there is an area of mesenteric fat inflammation in the right lower quadrant, and although more closely abutting distal small bowel loops (which seem to remain normal) this is most compatible with epiploic appendagitis related to the sigmoid colon - the inflamed area appears to connect to the sigmoid via a fatty stalk. No other active inflammation in the sigmoid or descending colon. Decompressed and negative transverse and ascending colon. Negative cecum, diminutive and normal appendix suspected on series 2, image 59. Negative terminal ileum. No dilated small bowel. Negative stomach and duodenum. No free air, free fluid. Vascular/Lymphatic: Mild Calcified aortic atherosclerosis. Major arterial structures are patent. Portal venous system is patent. No lymphadenopathy. Reproductive: Negative. Other: No pelvic free fluid. Musculoskeletal: Stable visualized osseous structures. No acute or suspicious osseous lesion identified. IMPRESSION: 1. Acute Epiploic Appendagitis of the sigmoid colon. 2. No other acute or inflammatory process. No metastatic disease identified. Regressed hepatic steatosis since 2017. Electronically Signed   By: Genevie Ann M.D.   On: 03/12/2020 14:39    Procedures Procedures (including critical care time)  Medications Ordered in ED Medications  sodium chloride 0.9 % bolus 1,000 mL (0 mLs Intravenous Stopped 03/12/20 1458)  ondansetron (ZOFRAN) injection 4 mg (4 mg Intravenous Given 03/12/20 1322)  morphine 4 MG/ML injection 4 mg (4 mg Intravenous Given 03/12/20 1323)  iohexol (OMNIPAQUE) 300 MG/ML solution 100 mL  (100 mLs Intravenous Contrast Given 03/12/20 1412)  potassium chloride SA (KLOR-CON) CR tablet 40 mEq (40 mEq Oral Given 03/12/20 1457)    ED Course  I have reviewed the triage vital signs and the nursing notes.  Pertinent labs & imaging results that were available during my care of the patient were reviewed by me and considered in my medical decision making (see chart for details).    MDM Rules/Calculators/A&P                          Patient presents to the ED with complaints of abdominal pain. Patient nontoxic appearing, in no apparent distress, vitals WNL . On exam patient tender to palpation in the right lower quadrant, with some guarding, non-tender in all other quadrants. Will evaluate with labs and CT abdomen pelvis. Analgesics, anti-emetics, and fluids administered. Differential includes cholecystitis, pancreatitis, diverticulitis, appendicitis, bowel obstruction/perforation,  I have independently ordered, reviewed and interpreted all labs and imaging: CBC: No leukocytosis, normal hemoglobin CMP: Potassium of 3.1 p.o. potassium replacement given, glucose 115, no other significant electrolyte derangements, normal renal and liver function. Lipase: WNL UA: No hematuria or signs of infection CT abdomen pelvis: Consistent with epiploic appendagitis, appendix appears normal, no evidence of diverticulitis or other acute abnormalities.  Discussed CT findings and reassuring lab work with patient and her husband.  Discussed that epiploic appendagitis is usually self-limited and goes away on its own, pain management is the main goal of treatment.  Patient is unable to tolerate NSAIDs, will treat with Tylenol and oxycodone, will also prescribe Zofran as needed for nausea.  Discussed with patient that in rare cases if this does  not improve it may require surgery will have her follow-up closely with PCP if symptoms or not improving within 10 days, return precautions discussed.  Patient and husband  expressed understanding, discharged home in good condition.  Final Clinical Impression(s) / ED Diagnoses Final diagnoses:  Epiploic appendagitis  Hypokalemia    Rx / DC Orders ED Discharge Orders         Ordered    oxyCODONE (OXY IR/ROXICODONE) 5 MG immediate release tablet  Every 6 hours PRN     Discontinue  Reprint     03/12/20 1520    ondansetron (ZOFRAN ODT) 4 MG disintegrating tablet     Discontinue  Reprint     03/12/20 [place], Delbert Vu [place], [place] 03/12/20 1545    Dorie Rank, MD 03/13/20 [phone]

## 2020-03-16 ENCOUNTER — Encounter: Payer: Self-pay | Admitting: Gastroenterology

## 2020-03-27 ENCOUNTER — Other Ambulatory Visit: Payer: Self-pay

## 2020-03-27 ENCOUNTER — Encounter: Payer: Self-pay | Admitting: Gastroenterology

## 2020-03-27 ENCOUNTER — Ambulatory Visit (AMBULATORY_SURGERY_CENTER): Payer: No Typology Code available for payment source | Admitting: Gastroenterology

## 2020-03-27 VITALS — BP 178/87 | HR 74 | Temp 97.8°F | Resp 14 | Ht 65.0 in | Wt 192.0 lb

## 2020-03-27 DIAGNOSIS — K5730 Diverticulosis of large intestine without perforation or abscess without bleeding: Secondary | ICD-10-CM | POA: Diagnosis not present

## 2020-03-27 DIAGNOSIS — K2970 Gastritis, unspecified, without bleeding: Secondary | ICD-10-CM | POA: Diagnosis not present

## 2020-03-27 DIAGNOSIS — K317 Polyp of stomach and duodenum: Secondary | ICD-10-CM

## 2020-03-27 DIAGNOSIS — R197 Diarrhea, unspecified: Secondary | ICD-10-CM

## 2020-03-27 DIAGNOSIS — K5520 Angiodysplasia of colon without hemorrhage: Secondary | ICD-10-CM | POA: Diagnosis not present

## 2020-03-27 DIAGNOSIS — K219 Gastro-esophageal reflux disease without esophagitis: Secondary | ICD-10-CM

## 2020-03-27 DIAGNOSIS — K2950 Unspecified chronic gastritis without bleeding: Secondary | ICD-10-CM

## 2020-03-27 DIAGNOSIS — K52832 Lymphocytic colitis: Secondary | ICD-10-CM

## 2020-03-27 DIAGNOSIS — K3189 Other diseases of stomach and duodenum: Secondary | ICD-10-CM | POA: Diagnosis not present

## 2020-03-27 DIAGNOSIS — Z86010 Personal history of colonic polyps: Secondary | ICD-10-CM

## 2020-03-27 DIAGNOSIS — K449 Diaphragmatic hernia without obstruction or gangrene: Secondary | ICD-10-CM | POA: Diagnosis not present

## 2020-03-27 MED ORDER — SODIUM CHLORIDE 0.9 % IV SOLN: 500.0000 mL | Freq: Once | INTRAVENOUS | Status: DC

## 2020-03-27 MED ORDER — PANTOPRAZOLE SODIUM 40 MG PO TBEC
40.0000 mg | DELAYED_RELEASE_TABLET | Freq: Every day | ORAL | 3 refills | Status: DC
Start: 2020-03-27 — End: 2020-06-08

## 2020-03-27 NOTE — Progress Notes (Signed)
VS by VV  Pt's states no medical or surgical changes since previsit or office visit.

## 2020-03-27 NOTE — Op Note (Addendum)
[facility] Patient Name: Alexandria Bradley Procedure Date: 03/27/2020 2:37 PM MRN: [id number] Endoscopist: Ladene Artist , MD Age: 59 Referring MD:  Date of Birth: [birth year] Gender: Female Account #: [id number] Procedure:                Colonoscopy Indications:              Chronic diarrhea, Personal history of adenomatous                            colon polyps. Medicines:                Monitored Anesthesia Care Procedure:                Pre-Anesthesia Assessment:                           - Prior to the procedure, a History and Physical                            was performed, and patient medications and                            allergies were reviewed. The patient's tolerance of                            previous anesthesia was also reviewed. The risks                            and benefits of the procedure and the sedation                            options and risks were discussed with the patient.                            All questions were answered, and informed consent                            was obtained. Prior Anticoagulants: The patient has                            taken no previous anticoagulant or antiplatelet                            agents. ASA Grade Assessment: II - A patient with                            mild systemic disease. After reviewing the risks                            and benefits, the patient was deemed in                            satisfactory condition to undergo the procedure.  After obtaining informed consent, the colonoscope                            was passed under direct vision. Throughout the                            procedure, the patient's blood pressure, pulse, and                            oxygen saturations were monitored continuously. The                            Colonoscope was introduced through the anus and                            advanced to the the terminal ileum, with                             identification of the appendiceal orifice and IC                            valve. The terminal ileum, ileocecal valve,                            appendiceal orifice, and rectum were photographed.                            The quality of the bowel preparation was good. The                            colonoscopy was performed without difficulty. The                            patient tolerated the procedure well. Scope In: 2:48:27 PM Scope Out: 3:03:46 PM Scope Withdrawal Time: 0 hours 13 minutes 30 seconds  Total Procedure Duration: 0 hours 15 minutes 19 seconds  Findings:                 The perianal and digital rectal examinations were                            normal.                           The terminal ileum appeared normal.                           A single small (2 mm) localized angiodysplastic                            lesion without bleeding was found in the cecum.                           Multiple medium-mouthed diverticula were found in  the left colon. Patchy mild erythema was seen in                            the sigmoid and rectum. There was no evidence of                            diverticular bleeding. Biopsies were taken with a                            cold forceps for histology.                           The exam was otherwise without abnormality on                            direct and retroflexion views. Random biopsies                            obtained. Complications:            No immediate complications. Estimated blood loss:                            None. Estimated Blood Loss:     Estimated blood loss: none. Impression:               - The examined portion of the ileum was normal.                           - A single non-bleeding colonic angiodysplastic                            lesion.                           - Mild diverticulosis in the left colon.                           - Patchy mild  erythema was seen in sigmoid colon                            and rectum. Biopsied.                           - The examination was otherwise normal on direct                            and retroflexion views. Random biopsies obtained. Recommendation:           - Repeat colonoscopy in 7 years for surveillance.                           - Patient has a contact number available for                            emergencies. The signs and symptoms of potential  delayed complications were discussed with the                            patient. Return to normal activities tomorrow.                            Written discharge instructions were provided to the                            patient.                           - Resume previous diet.                           - Continue present medications.                           - Await pathology results.                           - Low FODMAP diet if biopsies unrevealing. Ladene Artist, MD 03/27/2020 3:19:46 PM This report has been signed electronically.

## 2020-03-27 NOTE — Op Note (Signed)
[place] Patient Name: Alexandria Bradley Procedure Date: 03/27/2020 2:37 PM MRN: [id number] Endoscopist: Ladene Artist , MD Age: 59 Referring MD:  Date of Birth: [birth year] Gender: Female Account #: [id number] Procedure:                Upper GI endoscopy Indications:              Gastroesophageal reflux disease Medicines:                Monitored Anesthesia Care Procedure:                Pre-Anesthesia Assessment:                           - Prior to the procedure, a History and Physical                            was performed, and patient medications and                            allergies were reviewed. The patient's tolerance of                            previous anesthesia was also reviewed. The risks                            and benefits of the procedure and the sedation                            options and risks were discussed with the patient.                            All questions were answered, and informed consent                            was obtained. Prior Anticoagulants: The patient has                            taken no previous anticoagulant or antiplatelet                            agents. ASA Grade Assessment: II - A patient with                            mild systemic disease. After reviewing the risks                            and benefits, the patient was deemed in                            satisfactory condition to undergo the procedure.                           After obtaining informed consent, the endoscope was  passed under direct vision. Throughout the                            procedure, the patient's blood pressure, pulse, and                            oxygen saturations were monitored continuously. The                            Endoscope was introduced through the mouth, and                            advanced to the second part of duodenum. The upper                            GI endoscopy was  accomplished without difficulty.                            The patient tolerated the procedure well. Scope In: Scope Out: Findings:                 The Z-line was variable and was found 37 cm from                            the incisors. Biopsies were taken with a cold                            forceps for histology.                           The exam of the esophagus was otherwise normal.                           A small hiatal hernia was present.                           Patchy mild inflammation characterized by erythema                            and granularity was found in the gastric body and                            in the gastric antrum. Biopsies were taken with a                            cold forceps for histology.                           The exam of the stomach was otherwise normal.                           The duodenal bulb and second portion of the  duodenum were normal. Complications:            No immediate complications. Estimated Blood Loss:     Estimated blood loss was minimal. Impression:               - Z-line variable, 37 cm from the incisors.                            Biopsied.                           - Small hiatal hernia.                           - Gastritis. Biopsied.                           - Normal duodenal bulb and second portion of the                            duodenum. Recommendation:           - Patient has a contact number available for                            emergencies. The signs and symptoms of potential                            delayed complications were discussed with the                            patient. Return to normal activities tomorrow.                            Written discharge instructions were provided to the                            patient.                           - Resume previous diet.                           - Follow antireflux measures long term.                           -  Continue present medications.                           - Await pathology results.                           - Protonix (pantoprazole) 40 mg PO daily, 1 year of                            refills. Ladene Artist, MD 03/27/2020 3:24:33 PM This report has been signed electronically.

## 2020-03-27 NOTE — Progress Notes (Signed)
Report to PACU, RN, vss, BBS= Clear.  

## 2020-03-27 NOTE — Patient Instructions (Addendum)
Impression/Recommendations:  Hiatal hernia handout given to patient. Gastritis handout given to patient. GERD handout with antireflux measures given to patient. Diverticulosis handout given to patient. FODMAP diet handout given to patient.  Resume previous diet. Antireflux measures long term. Continue present medications. Await pathology results.  Protonix (Pantoprazole) 40 mg. By mouth daily.    Repeat colonoscopy in 7 years for surveillance.  YOU HAD AN ENDOSCOPIC PROCEDURE TODAY AT [facility]:   Refer to the procedure report that was given to you for any specific questions about what was found during the examination.  If the procedure report does not answer your questions, please call your gastroenterologist to clarify.  If you requested that your care partner not be given the details of your procedure findings, then the procedure report has been included in a sealed envelope for you to review at your convenience later.  YOU SHOULD EXPECT: Some feelings of bloating in the abdomen. Passage of more gas than usual.  Walking can help get rid of the air that was put into your GI tract during the procedure and reduce the bloating. If you had a lower endoscopy (such as a colonoscopy or flexible sigmoidoscopy) you may notice spotting of blood in your stool or on the toilet paper. If you underwent a bowel prep for your procedure, you may not have a normal bowel movement for a few days.  Please Note:  You might notice some irritation and congestion in your nose or some drainage.  This is from the oxygen used during your procedure.  There is no need for concern and it should clear up in a day or so.  SYMPTOMS TO REPORT IMMEDIATELY:   Following lower endoscopy (colonoscopy or flexible sigmoidoscopy):  Excessive amounts of blood in the stool  Significant tenderness or worsening of abdominal pains  Swelling of the abdomen that is new, acute  Fever of 100F or  higher   Following upper endoscopy (EGD)  Vomiting of blood or coffee ground material  New chest pain or pain under the shoulder blades  Painful or persistently difficult swallowing  New shortness of breath  Fever of 100F or higher  Black, tarry-looking stools  For urgent or emergent issues, a gastroenterologist can be reached at any hour by calling [phone]. Do not use MyChart messaging for urgent concerns.    DIET:  We do recommend a small meal at first, but then you may proceed to your regular diet.  Drink plenty of fluids but you should avoid alcoholic beverages for 24 hours.  ACTIVITY:  You should plan to take it easy for the rest of today and you should NOT DRIVE or use heavy machinery until tomorrow (because of the sedation medicines used during the test).    FOLLOW UP: Our staff will call the number listed on your records 48-72 hours following your procedure to check on you and address any questions or concerns that you may have regarding the information given to you following your procedure. If we do not reach you, we will leave a message.  We will attempt to reach you two times.  During this call, we will ask if you have developed any symptoms of COVID 19. If you develop any symptoms (ie: fever, flu-like symptoms, shortness of breath, cough etc.) before then, please call [phone].  If you test positive for Covid 19 in the 2 weeks post procedure, please call and report this information to [place].    If any biopsies were taken you  will be contacted by phone or by letter within the next 1-3 weeks.  Please call us at [phone] if you have not heard about the biopsies in 3 weeks.    SIGNATURES/CONFIDENTIALITY: You and/or your care partner have signed paperwork which will be entered into your electronic medical record.  These signatures attest to the fact that that the information above on your After Visit Summary has been reviewed and is understood.  Full responsibility of  the confidentiality of this discharge information lies with you and/or your care-partner.

## 2020-03-29 ENCOUNTER — Telehealth: Payer: Self-pay | Admitting: *Deleted

## 2020-03-29 NOTE — Telephone Encounter (Signed)
Attempted 2nd f/u phone call. No answer. Left message.  °

## 2020-03-29 NOTE — Telephone Encounter (Signed)
  Follow up Call-  Call back number 03/27/2020  Post procedure Call Back phone  # [phone]  Permission to leave phone message Yes  Some recent data might be hidden    [facility]

## 2020-03-31 ENCOUNTER — Other Ambulatory Visit: Payer: Self-pay

## 2020-03-31 MED ORDER — BUDESONIDE 3 MG PO CPEP
9.0000 mg | ORAL_CAPSULE | Freq: Every day | ORAL | 0 refills | Status: DC
Start: 2020-03-31 — End: 2020-06-21

## 2020-05-08 ENCOUNTER — Telehealth: Payer: Self-pay | Admitting: Gastroenterology

## 2020-05-08 NOTE — Telephone Encounter (Signed)
I received a staff message from her plastic surgeon today on this same question and replied that Entocort is very different than most oral corticosteroid medications as it has very little or no systemic absorption so it should not have a significant impact on surgical healing.   Advised it is ok with me to stop Entocort on the timeframe requested by her plastic surgeon however her diarrhea may recur.  I asked her plastic surgeon when could she resume Entocort post op if it is needed.  If her diarrhea recurs while she is off Entocort she can resume her prior medications to control: Questran 4 g po bid Imodium 1-2 po qid prn

## 2020-05-08 NOTE — Telephone Encounter (Signed)
Patient stats she is scheduled for breast augmentation on 9/13/2. She is currently taking budesonide for lymphocytic colitis and the medication is helping with her diarrhea. Patient states her plastic surgeon wanted to make sure she can be on this medication during surgery or if she needed to hold this medication prior to surgery. Please advise Dr. Fuller Plan. Patient has scheduled f/u visit with you on 06/07/20.

## 2020-05-08 NOTE — Telephone Encounter (Signed)
Pt is requesting a call back from a nurse to discuss the budesonide medication.

## 2020-05-08 NOTE — Telephone Encounter (Signed)
Informed patient of Dr. Lynne Leader recommendations and patient verbalized understanding. She states she will discuss further with her plastic surgeon.

## 2020-06-06 ENCOUNTER — Other Ambulatory Visit: Payer: Self-pay

## 2020-06-06 ENCOUNTER — Encounter (HOSPITAL_BASED_OUTPATIENT_CLINIC_OR_DEPARTMENT_OTHER): Payer: Self-pay | Admitting: Plastic Surgery

## 2020-06-08 ENCOUNTER — Encounter (HOSPITAL_BASED_OUTPATIENT_CLINIC_OR_DEPARTMENT_OTHER)
Admission: RE | Admit: 2020-06-08 | Discharge: 2020-06-08 | Disposition: A | Discharge status: Home / Self Care | Payer: No Typology Code available for payment source | Source: Ambulatory Visit | Attending: Plastic Surgery | Admitting: Plastic Surgery

## 2020-06-08 ENCOUNTER — Ambulatory Visit (INDEPENDENT_AMBULATORY_CARE_PROVIDER_SITE_OTHER): Payer: No Typology Code available for payment source | Admitting: Gastroenterology

## 2020-06-08 ENCOUNTER — Other Ambulatory Visit (HOSPITAL_COMMUNITY)
Admission: RE | Admit: 2020-06-08 | Discharge: 2020-06-08 | Disposition: A | Discharge status: Home / Self Care | Payer: No Typology Code available for payment source | Source: Ambulatory Visit | Attending: Ophthalmology | Admitting: Ophthalmology

## 2020-06-08 ENCOUNTER — Other Ambulatory Visit: Payer: Self-pay

## 2020-06-08 ENCOUNTER — Encounter: Payer: Self-pay | Admitting: Gastroenterology

## 2020-06-08 VITALS — BP 148/78 | HR 84 | Ht 65.0 in | Wt 193.6 lb

## 2020-06-08 DIAGNOSIS — K52832 Lymphocytic colitis: Secondary | ICD-10-CM

## 2020-06-08 DIAGNOSIS — K219 Gastro-esophageal reflux disease without esophagitis: Secondary | ICD-10-CM

## 2020-06-08 DIAGNOSIS — Z01812 Encounter for preprocedural laboratory examination: Secondary | ICD-10-CM | POA: Insufficient documentation

## 2020-06-08 DIAGNOSIS — Z20822 Contact with and (suspected) exposure to covid-19: Secondary | ICD-10-CM | POA: Insufficient documentation

## 2020-06-08 LAB — BASIC METABOLIC PANEL
Anion gap: 12 (ref 5–15)
BUN: 13 mg/dL (ref 8–23)
CO2: 28 mmol/L (ref 22–32)
Calcium: 9.9 mg/dL (ref 8.9–10.3)
Chloride: 99 mmol/L (ref 98–111)
Creatinine, Ser: 0.79 mg/dL (ref 0.44–1.00)
GFR calc Af Amer: >60 mL/min (ref >60)
GFR calc non Af Amer: >60 mL/min (ref >60)
Glucose, Bld: 103 mg/dL — ABNORMAL HIGH (ref 70–99)
Potassium: 4 mmol/L (ref 3.5–5.1)
Sodium: 139 mmol/L (ref 135–145)

## 2020-06-08 LAB — SARS CORONAVIRUS 2 (TAT 6-24 HRS): SARS Coronavirus 2: NEGATIVE

## 2020-06-08 MED ORDER — ESOMEPRAZOLE MAGNESIUM 40 MG PO CPDR: 40.0000 mg | DELAYED_RELEASE_CAPSULE | Freq: Every day | ORAL | 11 refills | Status: DC

## 2020-06-08 NOTE — Progress Notes (Addendum)
° ° °  History of Present Illness: This is a 59 year old female with lymphocytic colitis.  Her diarrhea has resolved on budesonide.  She states her reflux symptoms are under good control.  She is concerned that pantoprazole and/or budesonide might be causing headaches, swelling and nausea.  She complains of frequent leg cramps and has a history of hypokalemia.  Blood work scheduled later today through another physician. She is scheduled for removal of bilateral tissue expanders and placement of bilateral breast implants on Sept 13th. I have communicated with her plastic surgeon and the plan is to remain on budesonide through this surgery.   Current Medications, Allergies, Past Medical History, Past Surgical History, Family History and Social History were reviewed in [employer] record.   Physical Exam: General: Well developed, well nourished, no acute distress Head: Normocephalic and atraumatic Eyes:  sclerae anicteric, EOMI Ears: Normal auditory acuity Mouth: Not examined, mask on during Covid-19 pandemic Lungs: Clear throughout to auscultation Heart: Regular rate and rhythm; no murmurs, rubs or bruits Abdomen: Soft, non tender and non distended. No masses, hepatosplenomegaly or hernias noted. Normal Bowel sounds Rectal: Not done Musculoskeletal: Symmetrical with no gross deformities  Pulses:  Normal pulses noted Extremities: No clubbing, cyanosis, edema or deformities noted Neurological: Alert oriented x 4, grossly nonfocal Psychological:  Alert and cooperative. Normal mood and affect   Assessment and Recommendations:  1.  Lymphocytic colitis.  Symptoms well controlled on Entocort 9 mg daily.  1 week after surgery we will attempt to discontinue Entocort with tapering to 6 mg for 4 days and then 3 mg for 4 days and then discontinuing.  If diarrhea returns will resume Entocort at 6 mg or 9 mg daily.  2.  GERD.  Follow antireflux measures.  She is concerned about side  effects from pantoprazole leading to headaches and nausea.  She has experienced similar symptoms on Dexilant and Prilosec.  Trial of Nexium 40 mg p.o. daily. REV in 1 year

## 2020-06-08 NOTE — Progress Notes (Signed)

## 2020-06-08 NOTE — Patient Instructions (Signed)
If you are age 61 or older, your body mass index should be between 23-30. Your Body mass index is 32.22 kg/m. If this is out of the aforementioned range listed, please consider follow up with your Primary Care Provider.  If you are age 49 or younger, your body mass index should be between 19-25. Your Body mass index is 32.22 kg/m. If this is out of the aformentioned range listed, please consider follow up with your Primary Care Provider.   Stop Pantoprazole   We have sent the following medications to your pharmacy for you to pick up at your convenience: Nexium 40 mg daily   1 week after your surgery taper your Entocort to 6 mg daily x 4 days, then decrease 3 mg daily x 4 days, then stop   If you have recurrent diarrhea symptoms after stopping Entocort then contact our office to restart medication   Thank you for choosing me and [facility].  Pricilla Riffle. Dagoberto Ligas., MD., Marval Regal

## 2020-06-11 NOTE — Anesthesia Preprocedure Evaluation (Addendum)
Anesthesia Evaluation  Patient identified by MRN, date of birth, ID band Patient awake    Reviewed: Allergy & Precautions, NPO status , Patient's Chart, lab work & pertinent test results  History of Anesthesia Complications (+) PONV  Airway Mallampati: II  TM Distance: >3 FB Neck ROM: Full    Dental no notable dental hx. (+) Teeth Intact, Dental Advisory Given   Pulmonary sleep apnea , former smoker,    Pulmonary exam normal breath sounds clear to auscultation       Cardiovascular hypertension, Pt. on medications Normal cardiovascular exam Rhythm:Regular Rate:Normal     Neuro/Psych  Headaches, PSYCHIATRIC DISORDERS Anxiety    GI/Hepatic Neg liver ROS, GERD  Medicated and Controlled,  Endo/Other  Hypothyroidism   Renal/GU negative Renal ROSK+ 4.0 Cr 0.79     Musculoskeletal  (+) Arthritis ,   Abdominal (+) + obese,   Peds  Hematology   Anesthesia Other Findings   Reproductive/Obstetrics                            Anesthesia Physical Anesthesia Plan  ASA: II  Anesthesia Plan: General   Post-op Pain Management:    Induction: Intravenous  PONV Risk Score and Plan: 3 and Treatment may vary due to age or medical condition, Midazolam, Dexamethasone and Ondansetron  Airway Management Planned: Oral ETT  Additional Equipment: None  Intra-op Plan:   Post-operative Plan: Extubation in OR  Informed Consent: I have reviewed the patients History and Physical, chart, labs and discussed the procedure including the risks, benefits and alternatives for the proposed anesthesia with the patient or authorized representative who has indicated his/her understanding and acceptance.     Dental advisory given  Plan Discussed with: CRNA and Anesthesiologist  Anesthesia Plan Comments:        Anesthesia Quick Evaluation

## 2020-06-12 ENCOUNTER — Other Ambulatory Visit: Payer: Self-pay

## 2020-06-12 ENCOUNTER — Encounter (HOSPITAL_BASED_OUTPATIENT_CLINIC_OR_DEPARTMENT_OTHER)
Admission: RE | Disposition: A | Discharge status: Home / Self Care | Payer: Self-pay | Source: Home / Self Care | Attending: Plastic Surgery

## 2020-06-12 ENCOUNTER — Ambulatory Visit (HOSPITAL_BASED_OUTPATIENT_CLINIC_OR_DEPARTMENT_OTHER): Payer: No Typology Code available for payment source | Admitting: Anesthesiology

## 2020-06-12 ENCOUNTER — Encounter (HOSPITAL_BASED_OUTPATIENT_CLINIC_OR_DEPARTMENT_OTHER): Payer: Self-pay | Admitting: Plastic Surgery

## 2020-06-12 ENCOUNTER — Ambulatory Visit (HOSPITAL_BASED_OUTPATIENT_CLINIC_OR_DEPARTMENT_OTHER)
Admission: RE | Admit: 2020-06-12 | Discharge: 2020-06-12 | Disposition: A | Discharge status: Home / Self Care | Payer: No Typology Code available for payment source | Attending: Plastic Surgery | Admitting: Plastic Surgery

## 2020-06-12 DIAGNOSIS — C50912 Malignant neoplasm of unspecified site of left female breast: Secondary | ICD-10-CM | POA: Diagnosis not present

## 2020-06-12 DIAGNOSIS — Z9013 Acquired absence of bilateral breasts and nipples: Secondary | ICD-10-CM | POA: Diagnosis not present

## 2020-06-12 DIAGNOSIS — Z45812 Encounter for adjustment or removal of left breast implant: Secondary | ICD-10-CM | POA: Diagnosis not present

## 2020-06-12 DIAGNOSIS — Z87891 Personal history of nicotine dependence: Secondary | ICD-10-CM | POA: Insufficient documentation

## 2020-06-12 DIAGNOSIS — Z171 Estrogen receptor negative status [ER-]: Secondary | ICD-10-CM | POA: Diagnosis not present

## 2020-06-12 DIAGNOSIS — Z9221 Personal history of antineoplastic chemotherapy: Secondary | ICD-10-CM | POA: Diagnosis not present

## 2020-06-12 DIAGNOSIS — G4730 Sleep apnea, unspecified: Secondary | ICD-10-CM | POA: Diagnosis not present

## 2020-06-12 DIAGNOSIS — M1990 Unspecified osteoarthritis, unspecified site: Secondary | ICD-10-CM | POA: Insufficient documentation

## 2020-06-12 DIAGNOSIS — I10 Essential (primary) hypertension: Secondary | ICD-10-CM | POA: Diagnosis not present

## 2020-06-12 DIAGNOSIS — Z45811 Encounter for adjustment or removal of right breast implant: Secondary | ICD-10-CM | POA: Diagnosis not present

## 2020-06-12 HISTORY — PX: LIPOSUCTION WITH LIPOFILLING: SHX6436

## 2020-06-12 HISTORY — DX: Sleep apnea, unspecified: G47.30

## 2020-06-12 HISTORY — PX: REMOVAL OF BILATERAL TISSUE EXPANDERS WITH PLACEMENT OF BILATERAL BREAST IMPLANTS: SHX6431

## 2020-06-12 SURGERY — REMOVAL, TISSUE EXPANDER, BREAST, BILATERAL, WITH BILATERAL IMPLANT IMPLANT INSERTION
Anesthesia: General | Site: Breast | Laterality: Bilateral

## 2020-06-12 MED ORDER — ACETAMINOPHEN 500 MG PO TABS
ORAL_TABLET | ORAL | Status: AC
  Filled 2020-06-12: qty 2

## 2020-06-12 MED ORDER — LIDOCAINE HCL (PF) 1 % IJ SOLN
INTRAMUSCULAR | Status: AC
  Filled 2020-06-12: qty 30

## 2020-06-12 MED ORDER — ONDANSETRON HCL 4 MG/2ML IJ SOLN: 4.0000 mg | Freq: Once | INTRAMUSCULAR | Status: DC | PRN

## 2020-06-12 MED ORDER — POVIDONE-IODINE 10 % EX SOLN
CUTANEOUS | Status: DC | PRN
  Administered 2020-06-12: 1 via TOPICAL

## 2020-06-12 MED ORDER — HYDROMORPHONE HCL 1 MG/ML IJ SOLN
0.2500 mg | INTRAMUSCULAR | Status: DC | PRN
  Administered 2020-06-12: 0.25 mg via INTRAVENOUS

## 2020-06-12 MED ORDER — CELECOXIB 200 MG PO CAPS
ORAL_CAPSULE | ORAL | Status: AC
  Filled 2020-06-12: qty 1

## 2020-06-12 MED ORDER — ACETAMINOPHEN 10 MG/ML IV SOLN: 1000.0000 mg | Freq: Once | INTRAVENOUS | Status: DC | PRN

## 2020-06-12 MED ORDER — PHENYLEPHRINE 40 MCG/ML (10ML) SYRINGE FOR IV PUSH (FOR BLOOD PRESSURE SUPPORT)
PREFILLED_SYRINGE | INTRAVENOUS | Status: DC | PRN
  Administered 2020-06-12: 80 ug via INTRAVENOUS
  Administered 2020-06-12: 120 ug via INTRAVENOUS

## 2020-06-12 MED ORDER — PHENYLEPHRINE 40 MCG/ML (10ML) SYRINGE FOR IV PUSH (FOR BLOOD PRESSURE SUPPORT)
PREFILLED_SYRINGE | INTRAVENOUS | Status: AC
  Filled 2020-06-12: qty 10

## 2020-06-12 MED ORDER — CEFAZOLIN SODIUM-DEXTROSE 2-4 GM/100ML-% IV SOLN
INTRAVENOUS | Status: AC
  Filled 2020-06-12: qty 100

## 2020-06-12 MED ORDER — DEXAMETHASONE SODIUM PHOSPHATE 10 MG/ML IJ SOLN
INTRAMUSCULAR | Status: DC | PRN
  Administered 2020-06-12: 5 mg via INTRAVENOUS

## 2020-06-12 MED ORDER — ACETAMINOPHEN 500 MG PO TABS
1000.0000 mg | ORAL_TABLET | ORAL | Status: AC
  Administered 2020-06-12: 1000 mg via ORAL

## 2020-06-12 MED ORDER — LACTATED RINGERS IV SOLN: INTRAVENOUS | Status: DC

## 2020-06-12 MED ORDER — ONDANSETRON HCL 4 MG/2ML IJ SOLN
INTRAMUSCULAR | Status: DC | PRN
  Administered 2020-06-12: 4 mg via INTRAVENOUS

## 2020-06-12 MED ORDER — DIPHENHYDRAMINE HCL 50 MG/ML IJ SOLN
INTRAMUSCULAR | Status: AC
  Filled 2020-06-12: qty 1

## 2020-06-12 MED ORDER — CEFAZOLIN SODIUM-DEXTROSE 2-4 GM/100ML-% IV SOLN
2.0000 g | INTRAVENOUS | Status: AC
  Administered 2020-06-12: 2 g via INTRAVENOUS

## 2020-06-12 MED ORDER — LIDOCAINE 2% (20 MG/ML) 5 ML SYRINGE
INTRAMUSCULAR | Status: AC
  Filled 2020-06-12: qty 5

## 2020-06-12 MED ORDER — EPHEDRINE SULFATE-NACL 50-0.9 MG/10ML-% IV SOSY
PREFILLED_SYRINGE | INTRAVENOUS | Status: DC | PRN
  Administered 2020-06-12: 5 mg via INTRAVENOUS

## 2020-06-12 MED ORDER — ROCURONIUM BROMIDE 10 MG/ML (PF) SYRINGE
PREFILLED_SYRINGE | INTRAVENOUS | Status: DC | PRN
  Administered 2020-06-12: 50 mg via INTRAVENOUS

## 2020-06-12 MED ORDER — MIDAZOLAM HCL 2 MG/2ML IJ SOLN
INTRAMUSCULAR | Status: DC | PRN
  Administered 2020-06-12: 2 mg via INTRAVENOUS

## 2020-06-12 MED ORDER — ROCURONIUM BROMIDE 10 MG/ML (PF) SYRINGE
PREFILLED_SYRINGE | INTRAVENOUS | Status: AC
  Filled 2020-06-12: qty 10

## 2020-06-12 MED ORDER — SCOPOLAMINE 1 MG/3DAYS TD PT72
1.0000 | MEDICATED_PATCH | TRANSDERMAL | Status: DC
  Administered 2020-06-12: 1.5 mg via TRANSDERMAL

## 2020-06-12 MED ORDER — EPHEDRINE 5 MG/ML INJ
INTRAVENOUS | Status: AC
  Filled 2020-06-12: qty 10

## 2020-06-12 MED ORDER — DEXAMETHASONE SODIUM PHOSPHATE 10 MG/ML IJ SOLN
INTRAMUSCULAR | Status: AC
  Filled 2020-06-12: qty 1

## 2020-06-12 MED ORDER — BUPIVACAINE HCL (PF) 0.25 % IJ SOLN
INTRAMUSCULAR | Status: AC
  Filled 2020-06-12: qty 30

## 2020-06-12 MED ORDER — EPINEPHRINE PF 1 MG/ML IJ SOLN
INTRAMUSCULAR | Status: AC
  Filled 2020-06-12: qty 1

## 2020-06-12 MED ORDER — HYDROMORPHONE HCL 1 MG/ML IJ SOLN
INTRAMUSCULAR | Status: AC
  Filled 2020-06-12: qty 0.5

## 2020-06-12 MED ORDER — SUGAMMADEX SODIUM 200 MG/2ML IV SOLN
INTRAVENOUS | Status: DC | PRN
  Administered 2020-06-12: 200 mg via INTRAVENOUS

## 2020-06-12 MED ORDER — CELECOXIB 200 MG PO CAPS
200.0000 mg | ORAL_CAPSULE | ORAL | Status: AC
  Administered 2020-06-12: 200 mg via ORAL

## 2020-06-12 MED ORDER — OXYCODONE HCL 5 MG/5ML PO SOLN: 5.0000 mg | Freq: Once | ORAL | Status: DC | PRN

## 2020-06-12 MED ORDER — SODIUM CHLORIDE 0.9 % IV SOLN
INTRAVENOUS | Status: DC | PRN
  Administered 2020-06-12: 1000 mL

## 2020-06-12 MED ORDER — SODIUM BICARBONATE 4.2 % IV SOLN
INTRAVENOUS | Status: AC
  Filled 2020-06-12: qty 10

## 2020-06-12 MED ORDER — FENTANYL CITRATE (PF) 250 MCG/5ML IJ SOLN
INTRAMUSCULAR | Status: DC | PRN
Start: 2020-06-12 — End: 2020-06-12
  Administered 2020-06-12: 100 ug via INTRAVENOUS

## 2020-06-12 MED ORDER — FENTANYL CITRATE (PF) 100 MCG/2ML IJ SOLN
INTRAMUSCULAR | Status: AC
  Filled 2020-06-12: qty 2

## 2020-06-12 MED ORDER — MIDAZOLAM HCL 2 MG/2ML IJ SOLN
INTRAMUSCULAR | Status: AC
  Filled 2020-06-12: qty 2

## 2020-06-12 MED ORDER — DIPHENHYDRAMINE HCL 50 MG/ML IJ SOLN
INTRAMUSCULAR | Status: DC | PRN
  Administered 2020-06-12: 6.25 mg via INTRAVENOUS

## 2020-06-12 MED ORDER — GABAPENTIN 300 MG PO CAPS
ORAL_CAPSULE | ORAL | Status: AC
  Filled 2020-06-12: qty 1

## 2020-06-12 MED ORDER — MEPERIDINE HCL 25 MG/ML IJ SOLN: 6.2500 mg | INTRAMUSCULAR | Status: DC | PRN

## 2020-06-12 MED ORDER — SODIUM BICARBONATE 4 % IV SOLN
INTRAVENOUS | Status: DC | PRN
  Administered 2020-06-12: 300 mL via INTRAMUSCULAR

## 2020-06-12 MED ORDER — SCOPOLAMINE 1 MG/3DAYS TD PT72
MEDICATED_PATCH | TRANSDERMAL | Status: AC
  Filled 2020-06-12: qty 1

## 2020-06-12 MED ORDER — PROPOFOL 10 MG/ML IV BOLUS
INTRAVENOUS | Status: DC | PRN
  Administered 2020-06-12: 170 mg via INTRAVENOUS

## 2020-06-12 MED ORDER — AMISULPRIDE (ANTIEMETIC) 5 MG/2ML IV SOLN: 10.0000 mg | Freq: Once | INTRAVENOUS | Status: DC | PRN

## 2020-06-12 MED ORDER — ONDANSETRON HCL 4 MG/2ML IJ SOLN
INTRAMUSCULAR | Status: AC
  Filled 2020-06-12: qty 2

## 2020-06-12 MED ORDER — LIDOCAINE 2% (20 MG/ML) 5 ML SYRINGE
INTRAMUSCULAR | Status: DC | PRN
  Administered 2020-06-12: 100 mg via INTRAVENOUS

## 2020-06-12 MED ORDER — GABAPENTIN 300 MG PO CAPS
300.0000 mg | ORAL_CAPSULE | ORAL | Status: AC
  Administered 2020-06-12: 300 mg via ORAL

## 2020-06-12 MED ORDER — OXYCODONE HCL 5 MG PO TABS: 5.0000 mg | ORAL_TABLET | Freq: Once | ORAL | Status: DC | PRN

## 2020-06-12 SURGICAL SUPPLY — 96 items
ADH SKN CLS APL DERMABOND .7 (GAUZE/BANDAGES/DRESSINGS) ×2
APL PRP STRL LF DISP 70% ISPRP (MISCELLANEOUS)
BAG DECANTER FOR FLEXI CONT (MISCELLANEOUS) ×3 IMPLANT
BINDER ABDOMINAL 10 UNV 27-48 (MISCELLANEOUS) ×2 IMPLANT
BINDER ABDOMINAL 12 SM 30-45 (SOFTGOODS) IMPLANT
BINDER BREAST 3XL (GAUZE/BANDAGES/DRESSINGS) IMPLANT
BINDER BREAST LRG (GAUZE/BANDAGES/DRESSINGS) IMPLANT
BINDER BREAST MEDIUM (GAUZE/BANDAGES/DRESSINGS) IMPLANT
BINDER BREAST XLRG (GAUZE/BANDAGES/DRESSINGS) IMPLANT
BINDER BREAST XXLRG (GAUZE/BANDAGES/DRESSINGS) ×2 IMPLANT
BLADE SURG 10 STRL SS (BLADE) ×6 IMPLANT
BLADE SURG 11 STRL SS (BLADE) ×3 IMPLANT
BNDG GAUZE ELAST 4 BULKY (GAUZE/BANDAGES/DRESSINGS) ×6 IMPLANT
CANISTER LIPO FAT HARVEST (MISCELLANEOUS) ×2 IMPLANT
CANISTER SUCT 1200ML W/VALVE (MISCELLANEOUS) ×5 IMPLANT
CHLORAPREP W/TINT 26 (MISCELLANEOUS) ×1 IMPLANT
COVER BACK TABLE 60X90IN (DRAPES) ×3 IMPLANT
COVER MAYO STAND STRL (DRAPES) ×6 IMPLANT
COVER WAND RF STERILE (DRAPES) IMPLANT
DECANTER SPIKE VIAL GLASS SM (MISCELLANEOUS) IMPLANT
DERMABOND ADVANCED (GAUZE/BANDAGES/DRESSINGS) ×4
DERMABOND ADVANCED .7 DNX12 (GAUZE/BANDAGES/DRESSINGS) ×2 IMPLANT
DRAIN CHANNEL 15F RND FF W/TCR (WOUND CARE) IMPLANT
DRAPE TOP ARMCOVERS (MISCELLANEOUS) ×3 IMPLANT
DRAPE U-SHAPE 76X120 STRL (DRAPES) ×3 IMPLANT
DRAPE UTILITY XL STRL (DRAPES) ×3 IMPLANT
DRSG PAD ABDOMINAL 8X10 ST (GAUZE/BANDAGES/DRESSINGS) ×10 IMPLANT
ELECT BLADE 4.0 EZ CLEAN MEGAD (MISCELLANEOUS)
ELECT COATED BLADE 2.86 ST (ELECTRODE) ×3 IMPLANT
ELECT REM PT RETURN 9FT ADLT (ELECTROSURGICAL) ×3
ELECTRODE BLDE 4.0 EZ CLN MEGD (MISCELLANEOUS) ×1 IMPLANT
ELECTRODE REM PT RTRN 9FT ADLT (ELECTROSURGICAL) ×1 IMPLANT
EVACUATOR SILICONE 100CC (DRAIN) IMPLANT
EXTRACTOR CANIST REVOLVE STRL (CANNISTER) IMPLANT
GLOVE BIO SURGEON STRL SZ 6 (GLOVE) ×10 IMPLANT
GOWN STRL REUS W/ TWL LRG LVL3 (GOWN DISPOSABLE) ×2 IMPLANT
GOWN STRL REUS W/TWL LRG LVL3 (GOWN DISPOSABLE) ×9
IMPL BREAST GEL P6.4X FULL 580 (Breast) IMPLANT
IMPL BREAST P6.2XRND LO 525 (Breast) IMPLANT
IMPL BRST GEL P6.4X FULL 580CC (Breast) ×1 IMPLANT
IMPL BRST P6.2XRND LO 525CC (Breast) ×1 IMPLANT
IMPLANT BREAST GEL 525CC (Breast) ×3 IMPLANT
IMPLANT BREAST GEL 580CC (Breast) ×3 IMPLANT
IV NS 500ML (IV SOLUTION)
IV NS 500ML BAXH (IV SOLUTION) IMPLANT
KIT FILL SYSTEM UNIVERSAL (SET/KITS/TRAYS/PACK) IMPLANT
LINER CANISTER 1000CC FLEX (MISCELLANEOUS) ×3 IMPLANT
MARKER SKIN DUAL TIP RULER LAB (MISCELLANEOUS) IMPLANT
NDL FILTER BLUNT 18X1 1/2 (NEEDLE) IMPLANT
NDL HYPO 25X1 1.5 SAFETY (NEEDLE) IMPLANT
NDL SAFETY ECLIPSE 18X1.5 (NEEDLE) ×1 IMPLANT
NEEDLE FILTER BLUNT 18X 1/2SAF (NEEDLE) ×2
NEEDLE FILTER BLUNT 18X1 1/2 (NEEDLE) ×1 IMPLANT
NEEDLE HYPO 18GX1.5 SHARP (NEEDLE) ×3
NEEDLE HYPO 25X1 1.5 SAFETY (NEEDLE) IMPLANT
NS IRRIG 1000ML POUR BTL (IV SOLUTION) ×2 IMPLANT
PACK BASIN DAY SURGERY FS (CUSTOM PROCEDURE TRAY) ×3 IMPLANT
PAD ALCOHOL SWAB (MISCELLANEOUS) ×3 IMPLANT
PENCIL SMOKE EVACUATOR (MISCELLANEOUS) ×3 IMPLANT
PIN SAFETY STERILE (MISCELLANEOUS) IMPLANT
PUNCH BIOPSY DERMAL 4MM (MISCELLANEOUS) IMPLANT
SHEET MEDIUM DRAPE 40X70 STRL (DRAPES) ×10 IMPLANT
SIZER BREAST REUSE GEL 525CC (SIZER) ×3
SIZER BREAST REUSE GEL 545CC (SIZER) ×3
SIZER BREAST REUSE GEL 560CC (SIZER) ×3
SIZER BREAST REUSE GEL 580CC (SIZER) ×3
SIZER BRST REUSE GEL 525CC (SIZER) IMPLANT
SIZER BRST REUSE GEL 545CC (SIZER) IMPLANT
SIZER BRST REUSE GEL 560CC (SIZER) IMPLANT
SIZER BRST REUSE GEL 580CC (SIZER) IMPLANT
SLEEVE SCD COMPRESS KNEE MED (MISCELLANEOUS) ×3 IMPLANT
SPONGE LAP 18X18 RF (DISPOSABLE) ×6 IMPLANT
STAPLER VISISTAT 35W (STAPLE) ×3 IMPLANT
SUT ETHILON 2 0 FS 18 (SUTURE) IMPLANT
SUT MNCRL AB 4-0 PS2 18 (SUTURE) ×6 IMPLANT
SUT PDS AB 2-0 CT2 27 (SUTURE) ×2 IMPLANT
SUT VIC AB 3-0 PS1 18 (SUTURE)
SUT VIC AB 3-0 PS1 18XBRD (SUTURE) IMPLANT
SUT VIC AB 3-0 SH 27 (SUTURE) ×6
SUT VIC AB 3-0 SH 27X BRD (SUTURE) ×2 IMPLANT
SUT VICRYL 4-0 PS2 18IN ABS (SUTURE) ×6 IMPLANT
SYR 10ML LL (SYRINGE) ×11 IMPLANT
SYR 20ML LL LF (SYRINGE) IMPLANT
SYR 50ML LL SCALE MARK (SYRINGE) ×9 IMPLANT
SYR BULB IRRIG 60ML STRL (SYRINGE) ×6 IMPLANT
SYR CONTROL 10ML LL (SYRINGE) IMPLANT
SYR TB 1ML LL NO SAFETY (SYRINGE) ×3 IMPLANT
SYR TOOMEY IRRIG 70ML (MISCELLANEOUS) ×3
SYRINGE TOOMEY IRRIG 70ML (MISCELLANEOUS) IMPLANT
TOWEL GREEN STERILE FF (TOWEL DISPOSABLE) ×4 IMPLANT
TUBE CONNECTING 20'X1/4 (TUBING) ×2
TUBE CONNECTING 20X1/4 (TUBING) ×3 IMPLANT
TUBING INFILTRATION IT-10001 (TUBING) ×3 IMPLANT
TUBING SET GRADUATE ASPIR 12FT (MISCELLANEOUS) ×3 IMPLANT
UNDERPAD 30X36 HEAVY ABSORB (UNDERPADS AND DIAPERS) ×6 IMPLANT
YANKAUER SUCT BULB TIP NO VENT (SUCTIONS) ×3 IMPLANT

## 2020-06-12 NOTE — Anesthesia Postprocedure Evaluation (Signed)
Anesthesia Post Note  Patient: Alexandria Bradley  Procedure(s) Performed: REMOVAL OF BILATERAL TISSUE EXPANDERS WITH PLACEMENT OF BILATERAL BREAST IMPLANTS (Bilateral Breast) LIPOFILLING TO BILATERAL CHEST (Bilateral Breast)     Patient location during evaluation: PACU Anesthesia Type: General Level of consciousness: awake and alert Pain management: pain level controlled Vital Signs Assessment: post-procedure vital signs reviewed and stable Respiratory status: spontaneous breathing, nonlabored ventilation, respiratory function stable and patient connected to nasal cannula oxygen Cardiovascular status: blood pressure returned to baseline and stable Postop Assessment: no apparent nausea or vomiting Anesthetic complications: no   No complications documented.  Last Vitals:  Vitals:   06/12/20 1529 06/12/20 1554  BP: 119/68 (!) 162/77  Pulse: 75 86  Resp: (!) 26 16  Temp:  36.4 C  SpO2: 98% 97%    Last Pain:  Vitals:   06/12/20 1554  TempSrc:   PainSc: 0-No pain                 Barnet Glasgow

## 2020-06-12 NOTE — Discharge Instructions (Signed)
  Post Anesthesia Home Care Instructions  Activity: Get plenty of rest for the remainder of the day. A responsible individual must stay with you for 24 hours following the procedure.  For the next 24 hours, DO NOT: -Drive a car -Paediatric nurse -Drink alcoholic beverages -Take any medication unless instructed by your physician -Make any legal decisions or sign important papers.  Meals: Start with liquid foods such as gelatin or soup. Progress to regular foods as tolerated. Avoid greasy, spicy, heavy foods. If nausea and/or vomiting occur, drink only clear liquids until the nausea and/or vomiting subsides. Call your physician if vomiting continues.  Special Instructions/Symptoms: Your throat may feel dry or sore from the anesthesia or the breathing tube placed in your throat during surgery. If this causes discomfort, gargle with warm salt water. The discomfort should disappear within 24 hours.  If you had a scopolamine patch placed behind your ear for the management of post- operative nausea and/or vomiting:  1. The medication in the patch is effective for 72 hours, after which it should be removed.  Wrap patch in a tissue and discard in the trash. Wash hands thoroughly with soap and water. 2. You may remove the patch earlier than 72 hours if you experience unpleasant side effects which may include dry mouth, dizziness or visual disturbances. 3. Avoid touching the patch. Wash your hands with soap and water after contact with the patch.    No Tylenol until 4:45 pm if needed.

## 2020-06-12 NOTE — H&P (Addendum)
  Subjective:     Patient ID: Alexandria Bradley is a 59 y.o. female.  HPI  Nearly 5 months post op replacement right chest expander. 14 months bilateral mastectomies with immediate expander based reconstruction. Presents for implant exchange.  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 Underwent lumpectomy, SLN followed by adjuvant radiation and chemotherapy.  Represented 10/2018 with palpable left breast mass. MMG/US sowed mass near lumpectomy scar at 10 o'clock, 7 cmfn, 14 x 12 x 13 mm. No axillary adenopathy. Biopsy with triple negative IDC.  MRI demonstrated known mass left breast UIQ 1.7 cm, LN normal.  Staging scans negative.  Completed neoadjuvant chemotherapy. Final MRI with mass measuring 1.5 cm. Started pembrolizumab adjuvantly, complicated by rash, and discontinued. Adjuvant capecitabine discontinued due to diarrhea.  Underwent bilateral mastectomies with immediate expander reconstruction, left LD flap. Final pathology left breast 0.5 cm IDC, 0/1 SLN. Course complicated by onset fever and open wound right chest at 7 weeks post op with removal right TE/ADM  Genetics 2016 negative.  Prior to lumpectomy B cup, prior to mastectomies wearing same but noted depression in area lumpectomy and does not fill out cup equally. Right mastectomy 622 g Left 655 g  Worked as reception for a Pharmacist, community prior to [employer], patient does not plan to return to work. Lives with husband who is a Insurance underwriter. Son and daughter in area, latter is RN with [employer].     Objective:   Physical Exam  Cardiovascular: Normal rate, regular rhythm and normal heart sounds.  Pulmonary/Chest: Effort normal and breath sounds normal.    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   Reviewed purpose of fat grafting to help with contour and minimize visible rippling. Discussed variable take graft, pain donor site and need for compression, fat necrosis that presents as palpable lumps. Recommend she purchase Spanx type garment for post op use.  Reviewed I do recommend silicone implants as prepectoral position on right in effort to reduce rippling. Discussed size largely guided by BW chest and cannot guarantee cup size. Reviewed will be larger than currently in expanders, likely have different volume implants given RT and LD flap on left. Reviewed shape of reconstruction will always be different namely radiated breast will have more rounded shape due to contraction soft tissue. Discussed rupture incidence and imaging surveillance silicone implants. Discussed round vs shaped implants- reviewed latter all have texturing and risk ALCL with textured implants. Patient has elected for silicone. Plan smooth round.  Completed ASPS consent for implant placement including physician patient checklist. Will provide Rx at time of surgery.  LEFT Natrelle 133S-MX-12-T 400 ml tissue expander placed,  fill volume 385 ml saline  RIGHT Natrelle 133S-FV-12T 400 ml tissue expander placed,  470 ml total fill volume saline

## 2020-06-12 NOTE — Transfer of Care (Signed)
Immediate Anesthesia Transfer of Care Note  Patient: Alexandria Bradley  Procedure(s) Performed: REMOVAL OF BILATERAL TISSUE EXPANDERS WITH PLACEMENT OF BILATERAL BREAST IMPLANTS (Bilateral Breast) LIPOFILLING TO BILATERAL CHEST (Bilateral Breast)  Patient Location: PACU  Anesthesia Type:General  Level of Consciousness: awake, alert , oriented, drowsy and patient cooperative  Airway & Oxygen Therapy: Patient Spontanous Breathing and Patient connected to face mask oxygen  Post-op Assessment: Report given to RN and Post -op Vital signs reviewed and stable  Post vital signs: Reviewed and stable  Last Vitals:  Vitals Value Taken Time  BP 119/82 06/12/20 1437  Temp    Pulse 78 06/12/20 1438  Resp 19 06/12/20 1438  SpO2 92 % 06/12/20 1438  Vitals shown include unvalidated device data.  Last Pain:  Vitals:   06/12/20 1040  TempSrc: Oral  PainSc: 0-No pain      Patients Stated Pain Goal: 0 (62/69/48 [id number])  Complications: No complications documented.

## 2020-06-12 NOTE — Anesthesia Procedure Notes (Signed)
Procedure Name: Intubation Date/Time: 06/12/2020 12:00 PM Performed by: Kathryne Hitch, CRNA Pre-anesthesia Checklist: Patient identified, Emergency Drugs available, Suction available and Patient being monitored Patient Re-evaluated:Patient Re-evaluated prior to induction Oxygen Delivery Method: Circle system utilized Preoxygenation: Pre-oxygenation with 100% oxygen Induction Type: IV induction Ventilation: Mask ventilation without difficulty Laryngoscope Size: Miller and 3 Tube type: Oral Tube size: 7.0 mm Number of attempts: 1 Airway Equipment and Method: Stylet and Oral airway Placement Confirmation: ETT inserted through vocal cords under direct vision,  positive ETCO2 and breath sounds checked- equal and bilateral Secured at: 24 cm Tube secured with: Tape Dental Injury: Teeth and Oropharynx as per pre-operative assessment

## 2020-06-12 NOTE — Op Note (Signed)
Operative Note   DATE OF OPERATION: 9.13.21  LOCATION: [facility]-outpatient  SURGICAL DIVISION: Plastic Surgery  PREOPERATIVE DIAGNOSES:  1. Histoyr breast cancer 2. Acquired absence breasts 3. History therapeutic radiation  POSTOPERATIVE DIAGNOSES:  same  PROCEDURE:  1. Removal bilateral chest tissue expanders and placement silicone implants 2. Lipofilling from abdomen to bilateral chest total 120 ml  SURGEON: Irene Limbo MD MBA  ASSISTANT: none  ANESTHESIA:  General.   EBL: 50 ml  COMPLICATIONS: None immediate.   INDICATIONS FOR PROCEDURE:  The patient, Alexandria Bradley, is a 59 y.o. female born on 1961-03-06, is here for staged breast reconstruction following bilateral mastectomies, right prepectoral expander acellular dermis reconstruction left prepectoral expander latissimus dorsi flap reconstruction.    FINDINGS: Complete incorporation ADM noted over right. Natrelle Soft Touch Smooth Round Extra Projection implants placed bilateral RIGHT 580 ml REF [id number] [id number] LEFT 525 ml REF SSX-525 [id number]. 90 ml fat infiltrated over right chest, 30 ml fat infiltrated over left chest.  DESCRIPTION OF PROCEDURE:  The patient's operative site was marked with the patient in the preoperative area to mark sternal notch, chest midline, anterior axillary lines. Supra and infraumbilical abdomen marked for liposuction.The patientwas taken to the operating room. SCDs were placed and IV antibiotics were given. The patient's operative site was prepped and draped in a sterile fashion. A time out was performed and all information was confirmed to be correct.I began onleftside. Incision made through prior mastectomy scar and carried through subcutaneous tissue to latissimus muscle. Muscle divided. Expander removed. Capsulotomy performed superiorly and medially.Sizer placed.  I then directed attention torightchest. Incision made in prior inframammary fold scar and carried  through superficial fascia and acellular dermis. Expander removed and well incorporated ADM noted.Superior and lateral capsulotomy performed. Thermal capsulorraphy performed over inframammary fold capsule. The inframammary fold was stabilized with interrupted 2-0 PDS suture from caudal incision capsule to chest wall. Sizer placed and patient brought to upright sitting position. Natrelle Smooth RoundExtra Projection539mlimplant selected for right chest and Extra Projection 525 ml implant selected of left chest placement.The patient was returned to supine position.  Stab incision made over right abdomen in prior scar and over left abdomen. Tumescent fluid infiltrated over supra and infraumbilical [id number] tumescent infiltrated. Power assisted liposuction performed to endpoint symmetric contour and soft tissue thickness, total lipoaspirate300 ml. The fat was then washed and prepared by gravity for infiltration. Harvested fat was then infiltrated in subcutaneous plane throughout total envelope mastectomy flap over right chest 90 ml, and over left chest over superior pole and axilla.  Each cavity irrigated with bacitracin, Ancef, gentamicinsolution.Hemostasis ensured.Each cavity then irrigated with Betadinesaline solution. The implant was placed inrightchest with Jake Michaelis funnel andimplant orientation ensured. Closure completed with 3-0 vicryl to closesuperficial fascia and ADMover implant. 3-0 and4-0 vicryl used to close dermis followed by 4-0 monocryl subcuticular. Implant placed inleftchest cavity.Closure completedwith 3-0 vicryl in latissimus muscle and superficial fascia, 4-0 vicryl in dermis, and 4-0 monocryl subcuticular skin closure. Liposuction port incisions approximated with simple 4-0 monocryl stitch.Tissue adhesiveapplied to chest incision. Dry dressing, followed by breastbinderapplied.  The patient was allowed to wake from anesthesia, extubated and taken to the  recovery room in satisfactory condition.   SPECIMENS: none  DRAINS: none

## 2020-06-13 NOTE — Addendum Note (Signed)
Addendum  created 06/13/20 1022 by Tawni Millers, CRNA   Charge Capture section accepted

## 2020-06-19 ENCOUNTER — Encounter (HOSPITAL_BASED_OUTPATIENT_CLINIC_OR_DEPARTMENT_OTHER): Payer: Self-pay | Admitting: Plastic Surgery

## 2020-06-21 ENCOUNTER — Other Ambulatory Visit: Payer: Self-pay | Admitting: Gastroenterology

## 2020-07-24 ENCOUNTER — Other Ambulatory Visit: Payer: 59

## 2020-07-24 ENCOUNTER — Other Ambulatory Visit: Payer: Self-pay | Admitting: Internal Medicine

## 2020-07-24 ENCOUNTER — Ambulatory Visit: Payer: 59 | Admitting: Oncology

## 2020-07-24 DIAGNOSIS — E041 Nontoxic single thyroid nodule: Secondary | ICD-10-CM

## 2020-08-03 ENCOUNTER — Ambulatory Visit
Admission: RE | Admit: 2020-08-03 | Discharge: 2020-08-03 | Disposition: A | Discharge status: Home / Self Care | Payer: No Typology Code available for payment source | Source: Ambulatory Visit | Attending: Internal Medicine | Admitting: Internal Medicine

## 2020-08-03 DIAGNOSIS — E041 Nontoxic single thyroid nodule: Secondary | ICD-10-CM

## 2020-08-03 NOTE — Progress Notes (Signed)
[place]  [phone] [phone]    ID: Alexandria Bradley OB: 1961-03-21  MR#: [id number]  JYN#:[id number]  Patient Care Team: [name], DO as PCP - General (Family Medicine) Josue Hector, MD as PCP - Cardiology (Cardiology) Aidenn Skellenger, Virgie Dad, MD as Consulting Physician (Oncology) Brien Few, MD as Consulting Physician (Obstetrics and Gynecology) [name], MD as Consulting Physician (General Surgery) Delrae Rend, MD as Consulting Physician (Endocrinology) Dorna Leitz, MD as Consulting Physician (Orthopedic Surgery) Josue Hector, MD as Consulting Physician (Cardiology) Irene Limbo, MD as Consulting Physician (Plastic Surgery) Ladene Artist, MD as Consulting Physician (Gastroenterology) OTHER MD:    CHIEF COMPLAINT: Triple negative breast cancer, locally recurrent (s/p bilateral mastectomies)  CURRENT TREATMENT: Observation   INTERVAL HISTORY: Rebbecca returns today for follow-up of her locally recurrent triple negative breast cancer. She is now under observation.  Since her last visit, she completed breast reconstruction on 06/12/2020 under Dr. Iran Planas.  She also presented to the ED on 03/12/2020 with fever and diarrhea. She underwent CT abdomen/pelvis at that time showing: acute epiploic appendagitis of the sigmoid.  There was no evidence of metastatic disease..  On 03/27/2020 she underwent EGD and colonoscopy.  EGD found a small hiatal hernia and mild inflammation in the gastric body.  The colonoscopy found a 2 mm angiodysplastic lesion in the cecum, not bleeding; there were multiple diverticuli,, with no evidence of bleeding.  There was no other abnormality.  Repeat in 7 years was recommended.  The pathology from the colon biopsies showed a lymphocytic colitis.  This is the reason for the patient's diarrhea.  She was started on Entocort 9 mg daily.  The biopsies from the stomach showed  gastritis.  She also underwent thyroid ultrasound yesterday, 08/03/2020. The results are pending.   REVIEW OF SYSTEMS: Dayleen tells me she tolerated the Entocort very poorly.  She is sure that she had systemic absorption.  She finally weaned off late September.  She does not have the really bad diarrhea as she had before but she does have her old irritable bowel symptoms which have recurred.  She completed her reconstruction but she thinks there may be some further work to be done and at some point she may want to get bigger implants she says.  Her ankles swell, very irregularly, sometimes both, sometimes only the left side.  She cannot tell whether active activity helps her hinders that.  A detailed review of systems today was otherwise noncontributory   COVID 19 VACCINATION STATUS: Status post Pfizer x2 most recent dose June 2021   BREAST CANCER HISTORY: From the original intake note of 02/16/2014:  [name] palpated a mass in her left breast early May and brought it immediately to her gynecologist attention. He set her up for bilateral diagnostic mammography and left ultrasonography at [facility] 02/07/2014. Mammography showed a 1.3 cm oval mass with spiculated margins in the left breast at the 11:00 position. This was palpable. Ultrasound showed a 1.1 cm tall her than wide mass with a microlobulated margins. He was hypoechoic. There were no abnormalities noted in the left axilla.  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 Lyndon Code at conference 02/16/2014), triple negative, with an MIB-1 of 50%.  The patient's subsequent history is as detailed below   PAST MEDICAL HISTORY: Past Medical History:  Diagnosis Date  . Anxiety   . Arthritis   .  Back pain   . Breast cancer ([place])    left  . Fatty liver   . GERD (gastroesophageal reflux disease)   . Headache(784.0)    PMH :  migraines  . History of kidney stones   . Hypercholesterolemia   . Hypertension   . Hypothyroidism   . Osteoporosis   . Personal history of chemotherapy   . Personal history of radiation therapy   . PONV (postoperative nausea and vomiting)    difficult to wake up  . Pre-diabetes   . Psoriasis   . PVC's (premature ventricular contractions)   . Radiation 08/18/14-10/07/14   Left breast  . Sleep apnea    no CPAP    PAST SURGICAL HISTORY: Past Surgical History:  Procedure Laterality Date  . BREAST BIOPSY Left 11/10/2018  . BREAST LUMPECTOMY Left 2015  . BREAST LUMPECTOMY WITH AXILLARY LYMPH NODE BIOPSY  6/15   left  . BREAST RECONSTRUCTION WITH PLACEMENT OF TISSUE EXPANDER AND ALLODERM Bilateral 04/12/2019   Procedure: BILATERAL BREAST RECONSTRUCTION WITH PLACEMENT OF TISSUE EXPANDERS; ALLODERM TO RIGHT CHEST;  Surgeon: Irene Limbo, MD;  Location: [place];  Service: Plastics;  Laterality: Bilateral;  . BREAST RECONSTRUCTION WITH PLACEMENT OF TISSUE EXPANDER AND ALLODERM Right 01/24/2020   Procedure: BREAST RECONSTRUCTION WITH PLACEMENT OF TISSUE EXPANDER AND ALLODERM;  Surgeon: Irene Limbo, MD;  Location: [place];  Service: Plastics;  Laterality: Right;  . CARDIAC CATHETERIZATION    . CHOLECYSTECTOMY    . colon polyps    . COLONOSCOPY N/A 06/27/2014   Procedure: COLONOSCOPY;  Surgeon: Lear Ng, MD;  Location: [facility] ENDOSCOPY;  Service: Endoscopy;  Laterality: N/A;  . LATISSIMUS FLAP TO BREAST Left 04/12/2019   Procedure: LEFT LATISSIMUS DORSI FLAP TO LEFT CHEST;  Surgeon: Irene Limbo, MD;  Location: [place];  Service: Plastics;  Laterality: Left;  . LEFT HEART CATH AND CORONARY ANGIOGRAPHY N/A 07/24/2018   Procedure: LEFT HEART CATH AND CORONARY ANGIOGRAPHY;  Surgeon: Troy Sine, MD;  Location: [place] CV LAB;  Service: Cardiovascular;  Laterality: N/A;  . LIPOMA EXCISION    . LIPOSUCTION WITH LIPOFILLING Bilateral 06/12/2020   Procedure:  LIPOFILLING TO BILATERAL CHEST;  Surgeon: Irene Limbo, MD;  Location: [place];  Service: Plastics;  Laterality: Bilateral;  . MASTECTOMY W/ SENTINEL NODE BIOPSY Bilateral 04/12/2019   Procedure: RIGHT BREAST RISK REDUCING MASTECTOMY; LEFT MASTECTOMY WITH LEFT AXILLARY SENTINEL LYMPH NODE BIOPSY WITH BLUE DYE INJECTION;  Surgeon: [name], MD;  Location: [place];  Service: General;  Laterality: Bilateral;  . MYOMECTOMY    . PORT-A-CATH REMOVAL N/A 02/03/2015   Procedure: REMOVAL PORT-A-CATH;  Surgeon: [name], MD;  Location: [place];  Service: General;  Laterality: N/A;  . PORT-A-CATH REMOVAL Right 01/24/2020   Procedure: REMOVAL PORT-A-CATH;  Surgeon: Irene Limbo, MD;  Location: [place];  Service: Plastics;  Laterality: Right;  . PORTACATH PLACEMENT N/A 03/01/2014   Procedure: INSERTION PORT-A-CATH;  Surgeon: [name], MD;  Location: [place];  Service: General;  Laterality: N/A;  . PORTACATH PLACEMENT N/A 11/18/2018   Procedure: INSERTION PORT-A-CATH WITH ULTRASOUND;  Surgeon: [name], MD;  Location: [place];  Service: General;  Laterality: N/A;  . REMOVAL OF BILATERAL TISSUE EXPANDERS WITH PLACEMENT OF BILATERAL BREAST IMPLANTS Bilateral 06/12/2020   Procedure: REMOVAL OF BILATERAL TISSUE EXPANDERS WITH PLACEMENT OF BILATERAL BREAST IMPLANTS;  Surgeon: Irene Limbo, MD;  Location: [place];  Service: Plastics;  Laterality: Bilateral;  .  REMOVAL OF TISSUE EXPANDER AND PLACEMENT OF IMPLANT Right 06/02/2019   Procedure: REMOVAL OF TISSUE EXPANDER RIGHT CHEST;  Surgeon: Irene Limbo, MD;  Location: [place];  Service: Plastics;  Laterality: Right;  . TONSILLECTOMY    . TUBAL LIGATION    . WOUND DEBRIDEMENT Left 06/02/2019   Procedure: LEFT CHEST DEBRIDEMENT;  Surgeon: Irene Limbo, MD;  Location: [place];  Service: Plastics;   Laterality: Left;    FAMILY HISTORY: Family History  Problem Relation Age of Onset  . Endometrial cancer Mother 54  . Hearing loss Mother   . Atrial fibrillation Mother   . Lung cancer Father   . Brain cancer Father   . Heart disease Maternal Aunt   . Atrial fibrillation Maternal Aunt   . Lung cancer Maternal Uncle   . Atrial fibrillation Maternal Uncle   . Stroke Maternal Grandmother   . Stroke Maternal Grandfather   . Bladder Cancer Maternal Uncle   . Multiple myeloma Maternal Uncle   . Other Son        trisomy 100  . Colon polyps Cousin   . Colon cancer Neg Hx   . Esophageal cancer Neg Hx   . Rectal cancer Neg Hx   . Stomach cancer Neg Hx    The patient's father died at the age of 75 from what may have been metastatic lung cancer. The patient knows very little about the father's side of her family. Her mother is alive at age 78. She was recently diagnosed with endometrial cancer. The patient has one brother, 2 sisters. There is no history of breast or ovarian cancer in the family.   GYNECOLOGIC HISTORY:  Menarche age 40, first live birth age 19. The patient is GX P3. One child died shortly after birth. She stopped having periods approximately 2005. She did not use hormone replacement. 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 Son [name] is  Nurse, adult in [place]. Daughter [name] lives in [place] and works for [employer] as an Therapist, sports.  The patient has three grandchildren.    ADVANCED DIRECTIVES: In the absence of any documents to the contrary her husband is her healthcare power of attorney  HEALTH MAINTENANCE: Social History   Tobacco Use  . Smoking status: Former Smoker    Packs/day: 0.50      Years: 15.00    Pack years: 7.50    Types: Cigarettes  . Smokeless tobacco: Never Used  . Tobacco comment: Quit smoking cigarettes in 2002  Vaping Use  . Vaping Use: Never used  Substance Use Topics  . Alcohol use: Not Currently  . Drug use: No     Colonoscopy: June 2021,*  PAP: 2013  Bone density: 04/10/2015, -1.1 ([facility])  Lipid panel:  Allergies  Allergen Reactions  . Chlorhexidine Itching  . Ciprofloxacin Anxiety, Rash and Other (See Comments)    GI upset  . Prednisone Shortness Of Breath and Other (See Comments)    "jacks me up" jittery   . Codeine Nausea Only  . Cortisone Swelling and Other (See Comments)    "jack me up"     Current Outpatient Medications  Medication Sig Dispense Refill  . augmented betamethasone dipropionate (DIPROLENE-AF) 0.05 % cream Apply 1 application  topically 2 (two) times daily as needed.    . budesonide (ENTOCORT EC) 3 MG 24 hr capsule TAKE 3 CAPSULES (9 MG TOTAL) BY MOUTH DAILY. 270 capsule 0  . diltiazem (CARDIZEM CD) 240 MG 24 hr capsule Take 1 capsule (240 mg total) by mouth daily. 90 capsule 3  . esomeprazole (NEXIUM) 40 MG capsule Take 1 capsule (40 mg total) by mouth daily at 12 noon. 30 capsule 11  . glycopyrrolate (ROBINUL) 2 MG tablet Take 1 tablet (2 mg total) by mouth 2 (two) times daily. (Patient not taking: Reported on 06/08/2020) 60 tablet 11  . liothyronine (CYTOMEL) 5 MCG tablet Take 5 mcg by mouth daily before breakfast.     . ondansetron (ZOFRAN ODT) 4 MG disintegrating tablet 70m ODT q4 hours prn nausea/vomit 10 tablet 0  . potassium chloride (KLOR-CON) 10 MEQ tablet Take 1 tablet (10 mEq total) by mouth daily. 180 tablet 1  . propranolol (INDERAL) 10 MG tablet Take 10 mg by mouth daily as needed (for heart palpitations).     . Saccharomyces boulardii (PROBIOTIC) 250 MG CAPS Take by mouth. (Patient not taking: Reported on 06/08/2020) 60 capsule   . TIROSINT 137 MCG CAPS Take 1 capsule by mouth every morning.    .  triamcinolone cream (KENALOG) 0.1 % Apply topically as needed.     . triamterene-hydrochlorothiazide (MAXZIDE-25) 37.5-25 MG tablet TAKE 1 TABLET BY MOUTH EVERY DAY 90 tablet 3   No current facility-administered medications for this visit.    OBJECTIVE: Middle-aged white woman who appears stated age V26   08/04/20 1045  BP: (!) 141/66  Pulse: 80  Resp: 19  Temp: 97.8 F (36.6 C)  SpO2: 97%   Wt Readings from Last 3 Encounters:  08/04/20 196 lb 4.8 oz (89 kg)  06/12/20 195 lb 5.2 oz (88.6 kg)  06/08/20 193 lb 9.6 oz (87.8 kg)   Body mass index is 32.67 kg/m.    ECOG FS:1 - Symptomatic but completely ambulatory  Sclerae unicteric, EOMs intact Wearing a mask No cervical or supraclavicular adenopathy Lungs no rales or rhonchi Heart regular rate and rhythm Abd soft, nontender, positive bowel sounds MSK no focal spinal tenderness, no upper extremity lymphedema Neuro: nonfocal, well oriented, appropriate affect Breasts: Status post bilateral mastectomies, with bilateral reconstruction.  There is no evidence of local recurrence.  Both axillae are benign.  LAB RESULTS: No results found for: SPEP  Lab Results  Component Value Date   WBC 8.5 08/04/2020   NEUTROABS 5.5 08/04/2020   HGB 13.2 08/04/2020   HCT 39.8 08/04/2020   MCV 96.1 08/04/2020   PLT 294 08/04/2020      Chemistry      Component Value Date/Time   NA 139 06/08/2020 1155   NA 140 06/19/2017 1312   K 4.0 06/08/2020 1155   K 3.5 06/19/2017 1312   CL 99 06/08/2020 1155   CO2 28 06/08/2020 1155   CO2 25 06/19/2017 1312   BUN 13 06/08/2020 1155   BUN 18.4 06/19/2017 1312   CREATININE 0.79 06/08/2020 1155   CREATININE 0.79 06/09/2019 1326   CREATININE 0.8 06/19/2017 1312      Component Value Date/Time   CALCIUM 9.9 06/08/2020 1155   CALCIUM 10.2 06/19/2017 1312   ALKPHOS 64 03/12/2020 1318   ALKPHOS 86 06/19/2017 1312   AST 49 (H) 03/12/2020 1318   AST 69 (H) 06/09/2019 1326   AST 15 06/19/2017  1312   ALT 36 03/12/2020 1318   ALT 40  06/09/2019 1326   ALT 22 06/19/2017 1312   BILITOT 0.6 03/12/2020 1318   BILITOT 0.3 06/09/2019 1326   BILITOT 0.26 06/19/2017 1312      No results found for: LABCA2  No components found for: LABCA125  No results for input(s): INR in the last 168 hours.  Urinalysis    Component Value Date/Time   COLORURINE STRAW (A) 03/12/2020 1425   APPEARANCEUR CLEAR 03/12/2020 1425   LABSPEC 1.010 03/12/2020 1425   LABSPEC 1.020 06/15/2014 1101   PHURINE 6.5 03/12/2020 1425   GLUCOSEU NEGATIVE 03/12/2020 1425   GLUCOSEU Negative 06/15/2014 1101   HGBUR NEGATIVE 03/12/2020 1425   BILIRUBINUR NEGATIVE 03/12/2020 1425   BILIRUBINUR Color Interference 06/15/2014 1101   KETONESUR NEGATIVE 03/12/2020 1425   PROTEINUR NEGATIVE 03/12/2020 1425   UROBILINOGEN 0.2 06/15/2014 1101   NITRITE NEGATIVE 03/12/2020 1425   LEUKOCYTESUR NEGATIVE 03/12/2020 1425   LEUKOCYTESUR Trace 06/15/2014 1101    STUDIES: No results found.   ASSESSMENT: 59 y.o. [place] woman status post left breast upper inner quadrant biopsy 02/08/2014 for a clinical T1c N0, stage IA invasive ductal carcinoma, grade 3, triple negative, with an MIB-1 of 50%.  (1) status post left lumpectomy and sentinel lymph node sampling 03/01/2014 for a pT1c pN0, stage IA invasive ductal carcinoma, grade 3, with negative margins, repeat prognostic panel again triple negative.  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  Left breast with breath hold technique/ 45 Gy  at 1.8 Gy per fraction x 25 fractions.   Left breast boost/ 16 Gy at 2 Gy per fraction x 8 fractions  (4) genetics counseling 07/06/2015 through the Breast/Ovarian cancer gene panel offered by [facility] found no deleterious mutations in ATM, BARD1, BRCA1, BRCA2, BRIP1, CDH1, CHEK2, EPCAM, FANCC, MLH1, MSH2, MSH6, NBN, PALB2, PMS2, PTEN, RAD51C, RAD51D, TP53, and XRCC2.  (5) question of sleep apnea  (6) transverse colon lesion noted on CT scan from 06/17/2014 - negative biopsy, also showed fatty liver  (a) repeat CT scan of the abdomen and pelvis with contrast 02/15/2016 negative  RECURRENT DISEASE: February 2020 (7) biopsy of palpable mass upper inner left breast 11/10/2018 confirms invasive ductal carcinoma, grade 2 or 3, triple negative, with an MIB-1 of 80%.  (a) CT scans of the chest abdomen and pelvis with contrast showed no stage IV disease  (b) bone scan 11/13/2018 shows no definite metastasis to bone  (8) neoadjuvant chemotherapy consisting of carboplatin and gemcitabine given days 1 and 8 of each 21-day cycle, for 6 cycles, started 11/26/2018, completed 03/18/2019  (a) repeat ultrasound 01/25/2019 (after 3 cycles) shows no change in measurable breast mass   (9) bilateral mastectomies as follows   (a) left mastectomy and sentinel lymph node sampling with immediate tissue expander placement and latissimus flap 04/12/2019 showed a residual ypT1a ypN0 invasive ductal carcinoma, grade 3, with negative margins; a single sentinel lymph node was removed.  Margins were negative  (b) right mastectomy with immediate implant placement showed no malignancy  (c) right expander removed 06/02/2019 secondary to infectious complications  (10) molecular testing on the 11/10/2018 biopsy showed negative PD-L1, negative androgen receptor, stable MSI  and proficient mismatch repair status.  The tumor mutational burden was low.  No PIK3 mutation was detected.  There was a pathogenic TP53 variant and negative  ER PR and HER-2 were confirmed.  (11) Adjuvant pembrolizumab given every 3 weeks beginning 06/09/2019, discontinued 09/22/2019 with rash  (12) Adjuvant capecitabine 1588m PO BID for 14 days on and 7 days off on a 21 day cycle starting 06/30/2019, discontinued January/2021 due to diarrhea  (13) lymphocytic colitis diagnosed by colonoscopy 03/27/2020  (a) Entocort started June 2021, discontinued at patient's preference late September 2021   PLAN: [place] "not bounced back the way I did the first time" from all the surgeries and procedures she has had for locally recurrent cancer.  She feels tight in her upper body, has decreased range of motion, and has very little upper body strength she says.  I think she would benefit from physical therapy and she is agreeable.  I have placed the referral for her.  We discussed Entocort which she is sure she absorbed systemically.  At any rate she still has some ankle swelling which is irregular and intermittent.  I suggest that she cut back on salt intake.  She is also going to check with her cardiologist regarding that  She is now a little over a year out from definitive surgery for her recurrent breast cancer with no evidence of disease recurrence.  She understands that scans in the absence of specific symptoms to evaluate really only lead to false positives and the majority of cases which could be dangerous to the patient and have not been shown to improve survival.  She does have migraines.  She has not seen a neurologist regarding this.  I suggested that might be a good idea for her although she says she is not able to tolerate any of the antimigraine medications.  She will see u[place]again in six and 12 months.  After the 156-monthisit we will start seeing her once a year basis  Total encounter time 35 minutes.* Sarajane Jews. Anusha Claus, MD 08/04/20 10:59 AM Medical Oncology and Hematology CoB[facility]4[place]  [phone]. [phone]  Fax. [phone] I, KaWilburn Mylaram acting as scribe for Dr. GuVirgie DadMagrinat.  I, GuLurline DelD, have reviewed the above documentation for accuracy and completeness, and I agree with the above.   *Total Encounter Time as defined by the Centers for Medicare and Medicaid Services includes, in addition to the face-to-face time of a patient visit (documented in the note above) non-face-to-face time: obtaining and reviewing outside history, ordering and reviewing medications, tests or procedures, care coordination (communications with other health care professionals or caregivers) and documentation in the medical record.

## 2020-08-04 ENCOUNTER — Inpatient Hospital Stay (HOSPITAL_BASED_OUTPATIENT_CLINIC_OR_DEPARTMENT_OTHER): Payer: No Typology Code available for payment source | Admitting: Oncology

## 2020-08-04 ENCOUNTER — Other Ambulatory Visit: Payer: Self-pay

## 2020-08-04 ENCOUNTER — Inpatient Hospital Stay: Payer: No Typology Code available for payment source | Attending: Oncology

## 2020-08-04 VITALS — BP 141/66 | HR 80 | Temp 97.8°F | Resp 19 | Ht 65.0 in | Wt 196.3 lb

## 2020-08-04 DIAGNOSIS — Z853 Personal history of malignant neoplasm of breast: Secondary | ICD-10-CM | POA: Diagnosis not present

## 2020-08-04 DIAGNOSIS — C50912 Malignant neoplasm of unspecified site of left female breast: Secondary | ICD-10-CM

## 2020-08-04 DIAGNOSIS — C50212 Malignant neoplasm of upper-inner quadrant of left female breast: Secondary | ICD-10-CM | POA: Diagnosis not present

## 2020-08-04 DIAGNOSIS — Z171 Estrogen receptor negative status [ER-]: Secondary | ICD-10-CM | POA: Diagnosis not present

## 2020-08-04 DIAGNOSIS — Z9013 Acquired absence of bilateral breasts and nipples: Secondary | ICD-10-CM | POA: Insufficient documentation

## 2020-08-04 DIAGNOSIS — Z9221 Personal history of antineoplastic chemotherapy: Secondary | ICD-10-CM | POA: Diagnosis not present

## 2020-08-04 DIAGNOSIS — Z923 Personal history of irradiation: Secondary | ICD-10-CM | POA: Insufficient documentation

## 2020-08-04 LAB — TSH: TSH: 0.982 u[IU]/mL (ref 0.308–3.960)

## 2020-08-04 LAB — COMPREHENSIVE METABOLIC PANEL
ALT: 57 U/L — ABNORMAL HIGH (ref 0–44)
AST: 63 U/L — ABNORMAL HIGH (ref 15–41)
Albumin: 4.1 g/dL (ref 3.5–5.0)
Alkaline Phosphatase: 67 U/L (ref 38–126)
Anion gap: 10 (ref 5–15)
BUN: 11 mg/dL (ref 8–23)
CO2: 25 mmol/L (ref 22–32)
Calcium: 10 mg/dL (ref 8.9–10.3)
Chloride: 104 mmol/L (ref 98–111)
Creatinine, Ser: 0.79 mg/dL (ref 0.44–1.00)
GFR, Estimated: >60 mL/min (ref >60)
Glucose, Bld: 110 mg/dL — ABNORMAL HIGH (ref 70–99)
Potassium: 3.8 mmol/L (ref 3.5–5.1)
Sodium: 139 mmol/L (ref 135–145)
Total Bilirubin: 0.6 mg/dL (ref 0.3–1.2)
Total Protein: 7.6 g/dL (ref 6.5–8.1)

## 2020-08-04 LAB — CBC WITH DIFFERENTIAL/PLATELET
Abs Immature Granulocytes: 0.06 10*3/uL (ref 0.00–0.07)
Basophils Absolute: 0.1 10*3/uL (ref 0.0–0.1)
Basophils Relative: 1 %
Eosinophils Absolute: 0.2 10*3/uL (ref 0.0–0.5)
Eosinophils Relative: 3 %
HCT: 39.8 % (ref 36.0–46.0)
Hemoglobin: 13.2 g/dL (ref 12.0–15.0)
Immature Granulocytes: 1 %
Lymphocytes Relative: 22 %
Lymphs Abs: 1.9 10*3/uL (ref 0.7–4.0)
MCH: 31.9 pg (ref 26.0–34.0)
MCHC: 33.2 g/dL (ref 30.0–36.0)
MCV: 96.1 fL (ref 80.0–100.0)
Monocytes Absolute: 0.7 10*3/uL (ref 0.1–1.0)
Monocytes Relative: 9 %
Neutro Abs: 5.5 10*3/uL (ref 1.7–7.7)
Neutrophils Relative %: 64 %
Platelets: 294 10*3/uL (ref 150–400)
RBC: 4.14 MIL/uL (ref 3.87–5.11)
RDW: 12.7 % (ref 11.5–15.5)
WBC: 8.5 10*3/uL (ref 4.0–10.5)
nRBC: 0 % (ref 0.0–0.2)

## 2020-08-08 ENCOUNTER — Ambulatory Visit: Payer: No Typology Code available for payment source | Attending: Oncology

## 2020-08-08 ENCOUNTER — Other Ambulatory Visit: Payer: Self-pay

## 2020-08-08 DIAGNOSIS — M25612 Stiffness of left shoulder, not elsewhere classified: Secondary | ICD-10-CM | POA: Diagnosis present

## 2020-08-08 DIAGNOSIS — R293 Abnormal posture: Secondary | ICD-10-CM | POA: Diagnosis present

## 2020-08-08 DIAGNOSIS — M25611 Stiffness of right shoulder, not elsewhere classified: Secondary | ICD-10-CM | POA: Insufficient documentation

## 2020-08-08 DIAGNOSIS — M6281 Muscle weakness (generalized): Secondary | ICD-10-CM

## 2020-08-08 DIAGNOSIS — Z483 Aftercare following surgery for neoplasm: Secondary | ICD-10-CM

## 2020-08-08 NOTE — Therapy (Signed)
[place], [place], [address] Phone: [phone]   Fax:  [phone]  Physical Therapy Evaluation  Patient Details  Name: Alexandria Bradley MRN: [id number] Date of Birth: 1960/10/28 Referring Provider (PT): Dr. Jana Hakim   Encounter Date: 08/08/2020   PT End of Session - 08/08/20 1147    Visit Number 1    Number of Visits 13    Date for PT Re-Evaluation 09/19/20    PT Start Time 0903    PT Stop Time 1000    PT Time Calculation (min) 57 min    Activity Tolerance Patient tolerated treatment well    Behavior During Therapy [facility] for tasks assessed/performed           Past Medical History:  Diagnosis Date  . Anxiety   . Arthritis   . Back pain   . Breast cancer ([place])    left  . Fatty liver   . GERD (gastroesophageal reflux disease)   . Headache(784.0)    PMH : migraines  . History of kidney stones   . Hypercholesterolemia   . Hypertension   . Hypothyroidism   . Osteoporosis   . Personal history of chemotherapy   . Personal history of radiation therapy   . PONV (postoperative nausea and vomiting)    difficult to wake up  . Pre-diabetes   . Psoriasis   . PVC's (premature ventricular contractions)   . Radiation 08/18/14-10/07/14   Left breast  . Sleep apnea    no CPAP    Past Surgical History:  Procedure Laterality Date  . BREAST BIOPSY Left 11/10/2018  . BREAST LUMPECTOMY Left 2015  . BREAST LUMPECTOMY WITH AXILLARY LYMPH NODE BIOPSY  6/15   left  . BREAST RECONSTRUCTION WITH PLACEMENT OF TISSUE EXPANDER AND ALLODERM Bilateral 04/12/2019   Procedure: BILATERAL BREAST RECONSTRUCTION WITH PLACEMENT OF TISSUE EXPANDERS; ALLODERM TO RIGHT CHEST;  Surgeon: Irene Limbo, MD;  Location: [place];  Service: Plastics;  Laterality: Bilateral;  . BREAST RECONSTRUCTION WITH PLACEMENT OF TISSUE EXPANDER AND ALLODERM Right 01/24/2020   Procedure: BREAST RECONSTRUCTION WITH PLACEMENT OF TISSUE  EXPANDER AND ALLODERM;  Surgeon: Irene Limbo, MD;  Location: [place];  Service: Plastics;  Laterality: Right;  . CARDIAC CATHETERIZATION    . CHOLECYSTECTOMY    . colon polyps    . COLONOSCOPY N/A 06/27/2014   Procedure: COLONOSCOPY;  Surgeon: Lear Ng, MD;  Location: [facility] ENDOSCOPY;  Service: Endoscopy;  Laterality: N/A;  . LATISSIMUS FLAP TO BREAST Left 04/12/2019   Procedure: LEFT LATISSIMUS DORSI FLAP TO LEFT CHEST;  Surgeon: Irene Limbo, MD;  Location: [place];  Service: Plastics;  Laterality: Left;  . LEFT HEART CATH AND CORONARY ANGIOGRAPHY N/A 07/24/2018   Procedure: LEFT HEART CATH AND CORONARY ANGIOGRAPHY;  Surgeon: Troy Sine, MD;  Location: [place] CV LAB;  Service: Cardiovascular;  Laterality: N/A;  . LIPOMA EXCISION    . LIPOSUCTION WITH LIPOFILLING Bilateral 06/12/2020   Procedure: LIPOFILLING TO BILATERAL CHEST;  Surgeon: Irene Limbo, MD;  Location: [place];  Service: Plastics;  Laterality: Bilateral;  . MASTECTOMY W/ SENTINEL NODE BIOPSY Bilateral 04/12/2019   Procedure: RIGHT BREAST RISK REDUCING MASTECTOMY; LEFT MASTECTOMY WITH LEFT AXILLARY SENTINEL LYMPH NODE BIOPSY WITH BLUE DYE INJECTION;  Surgeon: [name], MD;  Location: [place];  Service: General;  Laterality: Bilateral;  . MYOMECTOMY    . PORT-A-CATH REMOVAL N/A 02/03/2015   Procedure: REMOVAL PORT-A-CATH;  Surgeon: [name], MD;  Location: [place];  Service: General;  Laterality: N/A;  . PORT-A-CATH REMOVAL Right 01/24/2020   Procedure: REMOVAL PORT-A-CATH;  Surgeon: Irene Limbo, MD;  Location: [place];  Service: Plastics;  Laterality: Right;  . PORTACATH PLACEMENT N/A 03/01/2014   Procedure: INSERTION PORT-A-CATH;  Surgeon: [name], MD;  Location: [place];  Service: General;  Laterality: N/A;  . PORTACATH PLACEMENT N/A 11/18/2018   Procedure: INSERTION PORT-A-CATH WITH  ULTRASOUND;  Surgeon: [name], MD;  Location: [place];  Service: General;  Laterality: N/A;  . REMOVAL OF BILATERAL TISSUE EXPANDERS WITH PLACEMENT OF BILATERAL BREAST IMPLANTS Bilateral 06/12/2020   Procedure: REMOVAL OF BILATERAL TISSUE EXPANDERS WITH PLACEMENT OF BILATERAL BREAST IMPLANTS;  Surgeon: Irene Limbo, MD;  Location: [place];  Service: Plastics;  Laterality: Bilateral;  . REMOVAL OF TISSUE EXPANDER AND PLACEMENT OF IMPLANT Right 06/02/2019   Procedure: REMOVAL OF TISSUE EXPANDER RIGHT CHEST;  Surgeon: Irene Limbo, MD;  Location: [place];  Service: Plastics;  Laterality: Right;  . TONSILLECTOMY    . TUBAL LIGATION    . WOUND DEBRIDEMENT Left 06/02/2019   Procedure: LEFT CHEST DEBRIDEMENT;  Surgeon: Irene Limbo, MD;  Location: [place];  Service: Plastics;  Laterality: Left;    There were no vitals filed for this visit.    Subjective Assessment - 08/08/20 0904    Subjective Had bilateral Mastectomies on 04/12/19 with SLNB and 2 nodes both neg.  Most recent surgery was 06/12/20 had expanders removed and implanats placed.  Had prior Brast surgery 02/07/14 for left triple negative cancer with chemo and radiation.  Now they are discussing removing implants and replacing with larger ones.  Feel like I don't have good stamina and arms are very weak, but I think my ROM is pretty good. Has occasional pain in right shoulder and has in the past had RTC injections.  Presently both shoulders can be painful intermittently    Patient Stated Goals Get some upper body strength back.  Reaching is difficult for her    Currently in Pain? Yes    Pain Score 0-No pain    Pain Location Shoulder    Pain Orientation Right    Pain Descriptors / Indicators Aching    Pain Type Acute pain    Pain Onset 1 to 4 weeks ago    Pain Frequency Intermittent    Aggravating Factors  hurts to lay on shoulder, reaching    Effect of Pain on Daily  Activities sleep, reaching    Multiple Pain Sites Yes    Pain Score 0    Pain Location Shoulder    Pain Orientation Left    Pain Descriptors / Indicators Aching    Pain Onset More than a month ago    Pain Frequency Intermittent    Aggravating Factors  sometimes laying on left shoulder    Effect of Pain on Daily Activities sometimes laying on arm, sometimes using left arm but not like right arm              [facility] PT Assessment - 08/08/20 0001      Assessment   Medical Diagnosis s/p bilateral mastectomy with tissue expanders    Referring Provider (PT) Dr. Jana Hakim    Onset Date/Surgical Date 04/12/19    Hand Dominance Right;Left    Prior Therapy no      Precautions   Precautions --   prior right breast infection  Restrictions   Weight Bearing Restrictions No      Balance Screen   Has the patient fallen in the past 6 months No    Has the patient had a decrease in activity level because of a fear of falling?  Yes    Is the patient reluctant to leave their home because of a fear of falling?  No      Home Environment   Living Environment Private residence    Living Arrangements Spouse/significant other   60 yr old grandson   Available Help at Discharge Family      Prior Function   Level of [place] On disability    Leisure not regular      Cognition   Overall Cognitive Status Within Functional Limits for tasks assessed      Observation/Other Assessments   Observations Mastectomy scars well healed.  Mild thickening left llateral incision.  Edema noted left lateral trunk and mid back, and mild right lateral trunk      Observation/Other Assessments-Edema    Edema --   see above     Posture/Postural Control   Posture/Postural Control Postural limitations    Postural Limitations Forward head;Rounded Shoulders      AROM   Right Shoulder Extension 43 Degrees    Right Shoulder Flexion 130 Degrees    Right Shoulder ABduction 89 Degrees     Right Shoulder Internal Rotation 38 Degrees    Right Shoulder External Rotation 80 Degrees    Left Shoulder Extension 58 Degrees    Left Shoulder Flexion 145 Degrees    Left Shoulder ABduction 130 Degrees    Left Shoulder Internal Rotation 70 Degrees    Left Shoulder External Rotation 72 Degrees    Left Shoulder Horizontal ABduction --      Strength   Right Shoulder Flexion 3-/5    Right Shoulder Internal Rotation 2+/5    Right Shoulder External Rotation 2+/5    Left Shoulder Flexion 4-/5    Left Shoulder Internal Rotation 3+/5    Left Shoulder External Rotation 3+/5             LYMPHEDEMA/ONCOLOGY QUESTIONNAIRE - 08/08/20 0001      Type   Cancer Type malignant neoplasm upper outer quadrant Left breast/recurrent CA      Surgeries   Mastectomy Date 04/12/19    Saline Implant Reconstruction Date 06/12/20    Sentinel Lymph Node Biopsy Date 04/12/19    Other Surgery Date 06/12/20   removal of expanders and had implants placed   Number Lymph Nodes Removed 1   most recent surgery     Treatment   Active Chemotherapy Treatment No    Past Chemotherapy Treatment Yes    Date 11/21/18    Active Radiation Treatment No    Past Radiation Treatment Yes    Date 08/08/14    Current Hormone Treatment No    Past Hormone Therapy No      What other symptoms do you have   Are you Having Heaviness or Tightness Yes    Are you having Pain Yes   intermittent, bilateral shoulders   Is it Hard or Difficult finding clothes that fit No    Do you have infections Yes    Comments Had right expander removed and later replaced    Is there Decreased scar mobility Yes    Other Symptoms restricted left breast lateral incision,       Right Upper Extremity Lymphedema  15 cm Proximal to Olecranon Process 31 cm    10 cm Proximal to Olecranon Process 28.9 cm    Olecranon Process 25 cm    15 cm Proximal to Ulnar Styloid Process 21.8 cm    Just Proximal to Ulnar Styloid Process 14.6 cm    Across Hand  at [employer] 19.1 cm    At [place] of 2nd Digit 6.2 cm    Other Left lateral trunk and mid back swelling, mild right lateral trunk swelling      Left Upper Extremity Lymphedema   15 cm Proximal to Olecranon Process 31.9 cm    10 cm Proximal to Olecranon Process 28.8 cm    Olecranon Process 25.5 cm    15 cm Proximal to Ulnar Styloid Process 25.2 cm    10 cm Proximal to Ulnar Styloid Process 22 cm    Just Proximal to Ulnar Styloid Process 14.5 cm    Across Hand at [employer] 19.5 cm    At [place] of 2nd Digit 6.3 cm    Other Left lateral trunk and left mid back                 Quick Dash - 08/08/20 0001    Open a tight or new jar Severe difficulty    Do heavy household chores (wash walls, wash floors) Unable    Carry a shopping bag or briefcase Mild difficulty    Wash your back Moderate difficulty    Use a knife to cut food No difficulty    Recreational activities in which you take some force or impact through your arm, shoulder, or hand (golf, hammering, tennis) Mild difficulty    During the past week, to what extent has your arm, shoulder or hand problem interfered with your normal social activities with family, friends, neighbors, or groups? Modererately    During the past week, to what extent has your arm, shoulder or hand problem limited your work or other regular daily activities Modererately    Arm, shoulder, or hand pain. Moderate    Tingling (pins and needles) in your arm, shoulder, or hand Moderate    Difficulty Sleeping Severe difficulty    DASH Score 50 %            Objective measurements completed on examination: See above findings.               PT Education - 08/08/20 1214    Education Details Pt. educated to start with sitting scapular retraction, advised to start walking 5 min- 7 min 3xs/day to start to improve endurance    Person(s) Educated Patient    Methods Explanation;Demonstration    Comprehension Verbalized understanding             PT Short Term Goals - 08/08/20 1203      PT SHORT TERM GOAL #1   Title Pt will be independant in HEP for shoulder ROM and strengthening    Time 4    Status New    Target Date 09/05/20      PT SHORT TERM GOAL #2   Title Pt will have 120 degrees of painfree  shoulder flexion and abduction so that she can perform self care easier    Time 4    Period Weeks    Status New    Target Date 09/05/20             PT Long Term Goals - 08/08/20 1204  PT LONG TERM GOAL #1   Title Right shoulder abd atleast 130 degrees for ability to dress and reach properly    Baseline 89    Time 5    Period Weeks    Status New    Target Date 09/12/20      PT LONG TERM GOAL #2   Title Pt will have bilateral shoulder ROM WNL to resume usual home activities    Time 6    Period Weeks    Status New    Target Date 09/19/20      PT LONG TERM GOAL #3   Title Pt will verbalize lymphedema risk reduction practices    Time 6    Period Days    Status New    Target Date 09/19/20      PT LONG TERM GOAL #4   Title Pt will be independent in a HEP for strengthening so that she can return to normal activities    Time 6    Period Weeks    Status New      PT LONG TERM GOAL #5   Title Pt will decrease Quick DASH score to < 20 indicating an improvement in functional use of UE    Baseline 50    Time 6    Period Weeks    Status New                  Plan - 08/08/20 1148    Clinical Impression Statement pt. is s/p recurrent CA. 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  Pt was diagnosed in 10/2018 with recurrent cancer and had neoadjuvant chemo prior to surgery on 04/12/19 with bilateral mastectomies with placement of  Tissue expanders and  Left SLNB .  Right expander had to be removed due to infection and later reinserted.  She had both tissue expanders removed on 06/12/20 and placement of implants.  She presents today with right  greater than left limitations in ROM, and strength, and left trunk and back swelling greater than right. She will benefit from PT for ROM and strengthening, as well as MLD and consideration of compresison bra/sleeves.  She also notes tha she lacks endurance and this frustrates her.    Personal Factors and Comorbidities Comorbidity 2    Comorbidities recurrent breast CA, Infection right breast requiring initial removal of expander, infection risk    Examination-Activity Limitations Dressing;Lift;Reach Overhead;Carry    Stability/Clinical Decision Making Stable/Uncomplicated    Clinical Decision Making Low    Rehab Potential Good    PT Frequency 2x / week    PT Duration 6 weeks    PT Treatment/Interventions ADLs/Self Care Home Management;Therapeutic exercise;Neuromuscular re-education;Patient/family education;Manual lymph drainage;Manual techniques;Passive range of motion    PT Next Visit Plan Progress Shoulder ROM Right greater than left all directions, Shoulder strength, MLD for trunk swelling, educate on endurance activities, consider [place] Hugger Compression bra for edema    Consulted and Agree with Plan of Care Patient           Patient will benefit from skilled therapeutic intervention in order to improve the following deficits and impairments:  Decreased endurance, Decreased knowledge of precautions, Decreased activity tolerance, Impaired UE functional use, Postural dysfunction, Pain, Increased edema, Decreased scar mobility, Decreased range of motion  Visit Diagnosis: Aftercare following surgery for neoplasm  Stiffness of left shoulder, not elsewhere classified  Stiffness of right shoulder, not elsewhere classified  Muscle weakness (generalized)  Abnormal posture     Problem List Patient Active Problem List   Diagnosis Date Noted  . Port-A-Cath in place 11/26/2018  . Recurrent cancer of left breast ([place]) 11/13/2018  . Neuropathy due to chemotherapeutic drug ([place])  11/13/2018  . Chest tightness   . Vertigo 05/28/2018  . Prediabetes 05/28/2018  . OSA (obstructive sleep apnea) 08/20/2016  . Snoring 08/20/2016  . Adjustment disorder with mixed anxiety and depressed mood 08/20/2016  . Genetic testing 07/07/2015  . Family history of uterine cancer   . Family history of bladder cancer   . Abnormal AST and ALT 07/08/2014  . Fatty infiltration of liver 07/08/2014  . RUQ pain 06/27/2014  . Nonspecific (abnormal) findings on radiological and other examination of gastrointestinal tract 06/27/2014  . Fever 06/15/2014  . Diarrhea 06/10/2014  . Chemotherapy-induced nausea 06/10/2014  . Abdominal pain 06/10/2014  . Drug induced neutropenia(288.03) 04/21/2014  . Malignant neoplasm of upper-inner quadrant of left breast in female, estrogen receptor negative ([place]) 02/11/2014  . OTHER CHEST PAIN 05/21/2010  . HYPERLIPIDEMIA 05/11/2010  . PSORIASIS 05/11/2010  . PALPITATIONS 05/11/2010  . Nonspecific (abnormal) findings on radiological and other examination of body structure 05/11/2010  . CHEST XRAY, ABNORMAL 05/11/2010  . Hypothyroidism 05/10/2010  . EPIGASTRIC PAIN 05/10/2010  . CONJUNCTIVITIS, ALLERGIC 02/18/2008  . MALAISE AND FATIGUE 01/11/2008  . Essential hypertension 09/02/2007  . SINUSITIS- ACUTE-NOS 09/02/2007  . Acute upper respiratory infection 09/02/2007  . Allergic rhinitis, cause unspecified 09/02/2007    Claris Pong, PT 08/08/2020, 12:21 PM  [place], [place], [address] Phone: [phone]   Fax:  [phone]  Name: SHAUNDA TIPPING MRN: [id number] Date of Birth: 1961-01-14

## 2020-08-10 ENCOUNTER — Ambulatory Visit: Payer: No Typology Code available for payment source

## 2020-08-10 ENCOUNTER — Other Ambulatory Visit: Payer: Self-pay

## 2020-08-10 DIAGNOSIS — M25611 Stiffness of right shoulder, not elsewhere classified: Secondary | ICD-10-CM

## 2020-08-10 DIAGNOSIS — Z483 Aftercare following surgery for neoplasm: Secondary | ICD-10-CM

## 2020-08-10 DIAGNOSIS — R293 Abnormal posture: Secondary | ICD-10-CM

## 2020-08-10 DIAGNOSIS — M25612 Stiffness of left shoulder, not elsewhere classified: Secondary | ICD-10-CM

## 2020-08-10 DIAGNOSIS — M6281 Muscle weakness (generalized): Secondary | ICD-10-CM

## 2020-08-10 NOTE — Therapy (Signed)
[place], [place], [address] Phone: [phone]   Fax:  [phone]  Physical Therapy Treatment  Patient Details  Name: Alexandria Bradley MRN: [id number] Date of Birth: 11/29/1960 Referring Provider (PT): Dr. Jana Hakim   Encounter Date: 08/10/2020   PT End of Session - 08/10/20 1659    Visit Number 2    Number of Visits 13    Date for PT Re-Evaluation 09/19/20    PT Start Time 0623    PT Stop Time 1650    PT Time Calculation (min) 55 min    Activity Tolerance Patient tolerated treatment well    Behavior During Therapy [facility] for tasks assessed/performed           Past Medical History:  Diagnosis Date  . Anxiety   . Arthritis   . Back pain   . Breast cancer ([place])    left  . Fatty liver   . GERD (gastroesophageal reflux disease)   . Headache(784.0)    PMH : migraines  . History of kidney stones   . Hypercholesterolemia   . Hypertension   . Hypothyroidism   . Osteoporosis   . Personal history of chemotherapy   . Personal history of radiation therapy   . PONV (postoperative nausea and vomiting)    difficult to wake up  . Pre-diabetes   . Psoriasis   . PVC's (premature ventricular contractions)   . Radiation 08/18/14-10/07/14   Left breast  . Sleep apnea    no CPAP    Past Surgical History:  Procedure Laterality Date  . BREAST BIOPSY Left 11/10/2018  . BREAST LUMPECTOMY Left 2015  . BREAST LUMPECTOMY WITH AXILLARY LYMPH NODE BIOPSY  6/15   left  . BREAST RECONSTRUCTION WITH PLACEMENT OF TISSUE EXPANDER AND ALLODERM Bilateral 04/12/2019   Procedure: BILATERAL BREAST RECONSTRUCTION WITH PLACEMENT OF TISSUE EXPANDERS; ALLODERM TO RIGHT CHEST;  Surgeon: Irene Limbo, MD;  Location: [place];  Service: Plastics;  Laterality: Bilateral;  . BREAST RECONSTRUCTION WITH PLACEMENT OF TISSUE EXPANDER AND ALLODERM Right 01/24/2020   Procedure: BREAST RECONSTRUCTION WITH PLACEMENT OF TISSUE  EXPANDER AND ALLODERM;  Surgeon: Irene Limbo, MD;  Location: [place];  Service: Plastics;  Laterality: Right;  . CARDIAC CATHETERIZATION    . CHOLECYSTECTOMY    . colon polyps    . COLONOSCOPY N/A 06/27/2014   Procedure: COLONOSCOPY;  Surgeon: Lear Ng, MD;  Location: [facility] ENDOSCOPY;  Service: Endoscopy;  Laterality: N/A;  . LATISSIMUS FLAP TO BREAST Left 04/12/2019   Procedure: LEFT LATISSIMUS DORSI FLAP TO LEFT CHEST;  Surgeon: Irene Limbo, MD;  Location: [place];  Service: Plastics;  Laterality: Left;  . LEFT HEART CATH AND CORONARY ANGIOGRAPHY N/A 07/24/2018   Procedure: LEFT HEART CATH AND CORONARY ANGIOGRAPHY;  Surgeon: Troy Sine, MD;  Location: [place] CV LAB;  Service: Cardiovascular;  Laterality: N/A;  . LIPOMA EXCISION    . LIPOSUCTION WITH LIPOFILLING Bilateral 06/12/2020   Procedure: LIPOFILLING TO BILATERAL CHEST;  Surgeon: Irene Limbo, MD;  Location: [place];  Service: Plastics;  Laterality: Bilateral;  . MASTECTOMY W/ SENTINEL NODE BIOPSY Bilateral 04/12/2019   Procedure: RIGHT BREAST RISK REDUCING MASTECTOMY; LEFT MASTECTOMY WITH LEFT AXILLARY SENTINEL LYMPH NODE BIOPSY WITH BLUE DYE INJECTION;  Surgeon: [name], MD;  Location: [place];  Service: General;  Laterality: Bilateral;  . MYOMECTOMY    . PORT-A-CATH REMOVAL N/A 02/03/2015   Procedure: REMOVAL PORT-A-CATH;  Surgeon: [name], MD;  Location: [place];  Service: General;  Laterality: N/A;  . PORT-A-CATH REMOVAL Right 01/24/2020   Procedure: REMOVAL PORT-A-CATH;  Surgeon: Irene Limbo, MD;  Location: [place];  Service: Plastics;  Laterality: Right;  . PORTACATH PLACEMENT N/A 03/01/2014   Procedure: INSERTION PORT-A-CATH;  Surgeon: [name], MD;  Location: [place];  Service: General;  Laterality: N/A;  . PORTACATH PLACEMENT N/A 11/18/2018   Procedure: INSERTION PORT-A-CATH WITH  ULTRASOUND;  Surgeon: [name], MD;  Location: [place];  Service: General;  Laterality: N/A;  . REMOVAL OF BILATERAL TISSUE EXPANDERS WITH PLACEMENT OF BILATERAL BREAST IMPLANTS Bilateral 06/12/2020   Procedure: REMOVAL OF BILATERAL TISSUE EXPANDERS WITH PLACEMENT OF BILATERAL BREAST IMPLANTS;  Surgeon: Irene Limbo, MD;  Location: [place];  Service: Plastics;  Laterality: Bilateral;  . REMOVAL OF TISSUE EXPANDER AND PLACEMENT OF IMPLANT Right 06/02/2019   Procedure: REMOVAL OF TISSUE EXPANDER RIGHT CHEST;  Surgeon: Irene Limbo, MD;  Location: [place];  Service: Plastics;  Laterality: Right;  . TONSILLECTOMY    . TUBAL LIGATION    . WOUND DEBRIDEMENT Left 06/02/2019   Procedure: LEFT CHEST DEBRIDEMENT;  Surgeon: Irene Limbo, MD;  Location: [place];  Service: Plastics;  Laterality: Left;    There were no vitals filed for this visit.   Subjective Assessment - 08/10/20 1552    Subjective Had a rough day yesterday and hurt all over with my arthritis. Today is a better day.  My neck is hurting today and fingers feel stiff. right shoulder is tender to touch.    Pertinent History bilateral mastectomies on 04/12/19 with left SLNB and 2 Neg. nodes.  Most recently had surgery 06/11/20 to remove expanders and place implants.  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 She has in the past had injections in her shoulder.    Patient Stated Goals Get some upper body strength back.  Reaching is difficult for her    Currently in Pain? No/denies                             [facility] Adult PT Treatment/Exercise - 08/10/20 0001      Manual Therapy   Manual Therapy Edema management;Manual Lymphatic Drainage (MLD);Passive ROM    Manual Lymphatic Drainage (MLD) short neck, left inguinal lymph nodes, left axillo-inguinal pathway and lateral trunk in supine and SL to decrease edema     Passive ROM Bilateral shoulders flex, abd, ER, IR.  Multiple VC's required to get pt to relax                  PT Education - 08/10/20 1658    Education Details Educated in 4 post op exercises;flex and ER in supine only secondary to shoulder compensation in standing, and will do wall walks on left only if painful on right    Person(s) Educated Patient    Methods Explanation;Verbal cues;Handout            PT Short Term Goals - 08/08/20 1203      PT SHORT TERM GOAL #1   Title Pt will be independant in HEP for shoulder ROM and strengthening    Time 4    Status New    Target Date 09/05/20      PT SHORT TERM GOAL #2   Title Pt will have 120 degrees of painfree  shoulder flexion and abduction so that she can perform self care easier    Time 4    Period Weeks    Status New    Target Date 09/05/20             PT Long Term Goals - 08/08/20 1204      PT LONG TERM GOAL #1   Title Right shoulder abd atleast 130 degrees for ability to dress and reach properly    Baseline 89    Time 5    Period Weeks    Status New    Target Date 09/12/20      PT LONG TERM GOAL #2   Title Pt will have bilateral shoulder ROM WNL to resume usual home activities    Time 6    Period Weeks    Status New    Target Date 09/19/20      PT LONG TERM GOAL #3   Title Pt will verbalize lymphedema risk reduction practices    Time 6    Period Days    Status New    Target Date 09/19/20      PT LONG TERM GOAL #4   Title Pt will be independent in a HEP for strengthening so that she can return to normal activities    Time 6    Period Weeks    Status New      PT LONG TERM GOAL #5   Title Pt will decrease Quick DASH score to < 20 indicating an improvement in functional use of UE    Baseline 50    Time 6    Period Weeks    Status New                 Plan - 08/10/20 1700    Clinical Impression Statement Therapy consisted of instruction in 4 D post op shoulder exs with flex and ER  in supine, and wall walk on left only, PROM for bilateral shoulder ROM with multiple VC's to get pt to relax and MLD to right lateral trunk and back.  Pt has multi joint arthritis and neck pain which contributes to right shoulder pain.  PROM was improved and pt relaxed better by end of rx    Personal Factors and Comorbidities Comorbidity 2    Examination-Activity Limitations Dressing;Lift;Reach Overhead;Carry    Stability/Clinical Decision Making Stable/Uncomplicated    PT Frequency 2x / week    PT Duration 6 weeks    PT Treatment/Interventions ADLs/Self Care Home Management;Therapeutic exercise;Neuromuscular re-education;Patient/family education;Manual lymph drainage;Manual techniques;Passive range of motion    PT Next Visit Plan Review 4 D post op, Continue trunk MLD, check incisions , educate on endurance activities, Compression bra?    PT Home Exercise Plan 4 D shoulder exs with flex and ER in supine    Recommended Other [place] hugger, prophylactic sleeve?    Consulted and Agree with Plan of Care Patient           Patient will benefit from skilled therapeutic intervention in order to improve the following deficits and impairments:  Decreased endurance, Decreased knowledge of precautions, Decreased activity tolerance, Impaired UE functional use, Postural dysfunction, Pain, Increased edema, Decreased scar mobility, Decreased range of motion  Visit Diagnosis: Aftercare following surgery for neoplasm  Stiffness of left shoulder, not elsewhere classified  Stiffness of right shoulder, not elsewhere classified  Muscle weakness (generalized)  Abnormal posture     Problem List Patient Active Problem List   Diagnosis Date Noted  .  Port-A-Cath in place 11/26/2018  . Recurrent cancer of left breast ([place]) 11/13/2018  . Neuropathy due to chemotherapeutic drug ([place]) 11/13/2018  . Chest tightness   . Vertigo 05/28/2018  . Prediabetes 05/28/2018  . OSA (obstructive sleep apnea)  08/20/2016  . Snoring 08/20/2016  . Adjustment disorder with mixed anxiety and depressed mood 08/20/2016  . Genetic testing 07/07/2015  . Family history of uterine cancer   . Family history of bladder cancer   . Abnormal AST and ALT 07/08/2014  . Fatty infiltration of liver 07/08/2014  . RUQ pain 06/27/2014  . Nonspecific (abnormal) findings on radiological and other examination of gastrointestinal tract 06/27/2014  . Fever 06/15/2014  . Diarrhea 06/10/2014  . Chemotherapy-induced nausea 06/10/2014  . Abdominal pain 06/10/2014  . Drug induced neutropenia(288.03) 04/21/2014  . Malignant neoplasm of upper-inner quadrant of left breast in female, estrogen receptor negative ([place]) 02/11/2014  . OTHER CHEST PAIN 05/21/2010  . HYPERLIPIDEMIA 05/11/2010  . PSORIASIS 05/11/2010  . PALPITATIONS 05/11/2010  . Nonspecific (abnormal) findings on radiological and other examination of body structure 05/11/2010  . CHEST XRAY, ABNORMAL 05/11/2010  . Hypothyroidism 05/10/2010  . EPIGASTRIC PAIN 05/10/2010  . CONJUNCTIVITIS, ALLERGIC 02/18/2008  . MALAISE AND FATIGUE 01/11/2008  . Essential hypertension 09/02/2007  . SINUSITIS- ACUTE-NOS 09/02/2007  . Acute upper respiratory infection 09/02/2007  . Allergic rhinitis, cause unspecified 09/02/2007    Elsie Ra [facility] 08/10/2020, 5:05 PM  [place], [place], [address] Phone: [phone]   Fax:  [phone]  Name: Alexandria Bradley MRN: [id number] Date of Birth: 04/04/1961

## 2020-08-10 NOTE — Patient Instructions (Signed)
Pt was given 4 D shoulder exercises but will do flexion and ER in supine secondary to right shoulder pain.  She will do wall walks with left only if she has shoulder pain on right

## 2020-08-16 ENCOUNTER — Ambulatory Visit: Payer: No Typology Code available for payment source

## 2020-08-16 ENCOUNTER — Other Ambulatory Visit: Payer: Self-pay

## 2020-08-16 DIAGNOSIS — M25612 Stiffness of left shoulder, not elsewhere classified: Secondary | ICD-10-CM

## 2020-08-16 DIAGNOSIS — M6281 Muscle weakness (generalized): Secondary | ICD-10-CM

## 2020-08-16 DIAGNOSIS — M25611 Stiffness of right shoulder, not elsewhere classified: Secondary | ICD-10-CM

## 2020-08-16 DIAGNOSIS — Z483 Aftercare following surgery for neoplasm: Secondary | ICD-10-CM | POA: Diagnosis not present

## 2020-08-16 DIAGNOSIS — R293 Abnormal posture: Secondary | ICD-10-CM

## 2020-08-16 NOTE — Therapy (Signed)
[place], [place], [address] Phone: [phone]   Fax:  [phone]  Physical Therapy Treatment  Patient Details  Name: Alexandria Bradley MRN: [id number] Date of Birth: [birth year] Referring Provider (PT): Dr. Jana Hakim   Encounter Date: 08/16/2020   PT End of Session - 08/16/20 1001    Visit Number 3    Number of Visits 13    Date for PT Re-Evaluation 09/19/20    PT Start Time 0859    PT Stop Time 0959    PT Time Calculation (min) 60 min    Activity Tolerance Patient tolerated treatment well    Behavior During Therapy [facility] for tasks assessed/performed           Past Medical History:  Diagnosis Date  . Anxiety   . Arthritis   . Back pain   . Breast cancer ([place])    left  . Fatty liver   . GERD (gastroesophageal reflux disease)   . Headache(784.0)    PMH : migraines  . History of kidney stones   . Hypercholesterolemia   . Hypertension   . Hypothyroidism   . Osteoporosis   . Personal history of chemotherapy   . Personal history of radiation therapy   . PONV (postoperative nausea and vomiting)    difficult to wake up  . Pre-diabetes   . Psoriasis   . PVC's (premature ventricular contractions)   . Radiation 08/18/14-10/07/14   Left breast  . Sleep apnea    no CPAP    Past Surgical History:  Procedure Laterality Date  . BREAST BIOPSY Left 11/10/2018  . BREAST LUMPECTOMY Left 2015  . BREAST LUMPECTOMY WITH AXILLARY LYMPH NODE BIOPSY  6/15   left  . BREAST RECONSTRUCTION WITH PLACEMENT OF TISSUE EXPANDER AND ALLODERM Bilateral 04/12/2019   Procedure: BILATERAL BREAST RECONSTRUCTION WITH PLACEMENT OF TISSUE EXPANDERS; ALLODERM TO RIGHT CHEST;  Surgeon: Irene Limbo, MD;  Location: [place];  Service: Plastics;  Laterality: Bilateral;  . BREAST RECONSTRUCTION WITH PLACEMENT OF TISSUE EXPANDER AND ALLODERM Right 01/24/2020   Procedure: BREAST RECONSTRUCTION WITH PLACEMENT OF TISSUE  EXPANDER AND ALLODERM;  Surgeon: Irene Limbo, MD;  Location: [place];  Service: Plastics;  Laterality: Right;  . CARDIAC CATHETERIZATION    . CHOLECYSTECTOMY    . colon polyps    . COLONOSCOPY N/A 06/27/2014   Procedure: COLONOSCOPY;  Surgeon: Lear Ng, MD;  Location: [facility] ENDOSCOPY;  Service: Endoscopy;  Laterality: N/A;  . LATISSIMUS FLAP TO BREAST Left 04/12/2019   Procedure: LEFT LATISSIMUS DORSI FLAP TO LEFT CHEST;  Surgeon: Irene Limbo, MD;  Location: [place];  Service: Plastics;  Laterality: Left;  . LEFT HEART CATH AND CORONARY ANGIOGRAPHY N/A 07/24/2018   Procedure: LEFT HEART CATH AND CORONARY ANGIOGRAPHY;  Surgeon: Troy Sine, MD;  Location: [place] CV LAB;  Service: Cardiovascular;  Laterality: N/A;  . LIPOMA EXCISION    . LIPOSUCTION WITH LIPOFILLING Bilateral 06/12/2020   Procedure: LIPOFILLING TO BILATERAL CHEST;  Surgeon: Irene Limbo, MD;  Location: [place];  Service: Plastics;  Laterality: Bilateral;  . MASTECTOMY W/ SENTINEL NODE BIOPSY Bilateral 04/12/2019   Procedure: RIGHT BREAST RISK REDUCING MASTECTOMY; LEFT MASTECTOMY WITH LEFT AXILLARY SENTINEL LYMPH NODE BIOPSY WITH BLUE DYE INJECTION;  Surgeon: [name], MD;  Location: [place];  Service: General;  Laterality: Bilateral;  . MYOMECTOMY    . PORT-A-CATH REMOVAL N/A 02/03/2015   Procedure: REMOVAL PORT-A-CATH;  Surgeon: [name], MD;  Location: [place];  Service: General;  Laterality: N/A;  . PORT-A-CATH REMOVAL Right 01/24/2020   Procedure: REMOVAL PORT-A-CATH;  Surgeon: Irene Limbo, MD;  Location: [place];  Service: Plastics;  Laterality: Right;  . PORTACATH PLACEMENT N/A 03/01/2014   Procedure: INSERTION PORT-A-CATH;  Surgeon: [name], MD;  Location: [place];  Service: General;  Laterality: N/A;  . PORTACATH PLACEMENT N/A 11/18/2018   Procedure: INSERTION PORT-A-CATH WITH  ULTRASOUND;  Surgeon: [name], MD;  Location: [place];  Service: General;  Laterality: N/A;  . REMOVAL OF BILATERAL TISSUE EXPANDERS WITH PLACEMENT OF BILATERAL BREAST IMPLANTS Bilateral 06/12/2020   Procedure: REMOVAL OF BILATERAL TISSUE EXPANDERS WITH PLACEMENT OF BILATERAL BREAST IMPLANTS;  Surgeon: Irene Limbo, MD;  Location: [place];  Service: Plastics;  Laterality: Bilateral;  . REMOVAL OF TISSUE EXPANDER AND PLACEMENT OF IMPLANT Right 06/02/2019   Procedure: REMOVAL OF TISSUE EXPANDER RIGHT CHEST;  Surgeon: Irene Limbo, MD;  Location: [place];  Service: Plastics;  Laterality: Right;  . TONSILLECTOMY    . TUBAL LIGATION    . WOUND DEBRIDEMENT Left 06/02/2019   Procedure: LEFT CHEST DEBRIDEMENT;  Surgeon: Irene Limbo, MD;  Location: [place];  Service: Plastics;  Laterality: Left;    There were no vitals filed for this visit.   Subjective Assessment - 08/16/20 0858    Subjective Arthritis isn't too bad today except my lower back. Yesterday right arm was sore after grocery shopping. Hardest exercise for left is the hands behind the left ER.  Chest still feels really tight.    Currently in Pain? No/denies    Pain Orientation Right;Left    Pain Frequency Intermittent                             [facility] Adult PT Treatment/Exercise - 08/16/20 0001      Shoulder Exercises: Supine   Other Supine Exercises AAROM flex x 5    Other Supine Exercises star gazer x 5      Shoulder Exercises: Pulleys   Flexion 2 minutes    Scaption 2 minutes      Shoulder Exercises: Therapy Ball   Flexion 10 reps      Manual Therapy   Manual Therapy Soft tissue mobilization;Manual Lymphatic Drainage (MLD);Passive ROM    Manual Lymphatic Drainage (MLD) short neck, left inguinal lymph nodes, left axillo-inguinal pathway and lateral trunk in supine and SL to decrease edema.  Allso performed on the right to Right inguinal  LN, axillo inguinal pathway and right lateral trunk in supine and SL.    Passive ROM Bilateral shoulders flex, abd, ER, IR.  Multiple VC's required to get pt to relax                    PT Short Term Goals - 08/08/20 1203      PT SHORT TERM GOAL #1   Title Pt will be independant in HEP for shoulder ROM and strengthening    Time 4    Status New    Target Date 09/05/20      PT SHORT TERM GOAL #2   Title Pt will have 120 degrees of painfree  shoulder flexion and abduction so that she can perform self care easier    Time 4    Period Weeks    Status New    Target Date 09/05/20  PT Long Term Goals - 08/08/20 1204      PT LONG TERM GOAL #1   Title Right shoulder abd atleast 130 degrees for ability to dress and reach properly    Baseline 89    Time 5    Period Weeks    Status New    Target Date 09/12/20      PT LONG TERM GOAL #2   Title Pt will have bilateral shoulder ROM WNL to resume usual home activities    Time 6    Period Weeks    Status New    Target Date 09/19/20      PT LONG TERM GOAL #3   Title Pt will verbalize lymphedema risk reduction practices    Time 6    Period Days    Status New    Target Date 09/19/20      PT LONG TERM GOAL #4   Title Pt will be independent in a HEP for strengthening so that she can return to normal activities    Time 6    Period Weeks    Status New      PT LONG TERM GOAL #5   Title Pt will decrease Quick DASH score to < 20 indicating an improvement in functional use of UE    Baseline 50    Time 6    Period Weeks    Status New                 Plan - 08/16/20 1253    Clinical Impression Statement Therapy consisted on MLD to bilateral lateral trunk. PROM of bilateral shoulders and therex.  Pt with good improvement in shoulder ROM, but continues with chest tightness bilaterally worse on the left.    Personal Factors and Comorbidities Comorbidity 2    Comorbidities recurrent breast CA, Infection right  breast requiring initial removal of expander, infection risk    Examination-Activity Limitations Dressing;Lift;Reach Overhead;Carry    Stability/Clinical Decision Making Stable/Uncomplicated    Rehab Potential Good    PT Frequency 2x / week    PT Duration 6 weeks    PT Treatment/Interventions ADLs/Self Care Home Management;Therapeutic exercise;Neuromuscular re-education;Patient/family education;Manual lymph drainage;Manual techniques;Passive range of motion    PT Next Visit Plan cont pROM, exercises, trunk MLD, compression bra?    PT Home Exercise Plan 4 D shoulder exs with flex and ER in supine    Consulted and Agree with Plan of Care Patient           Patient will benefit from skilled therapeutic intervention in order to improve the following deficits and impairments:  Decreased endurance, Decreased knowledge of precautions, Decreased activity tolerance, Impaired UE functional use, Postural dysfunction, Pain, Increased edema, Decreased scar mobility, Decreased range of motion  Visit Diagnosis: Aftercare following surgery for neoplasm  Stiffness of left shoulder, not elsewhere classified  Stiffness of right shoulder, not elsewhere classified  Muscle weakness (generalized)  Abnormal posture     Problem List Patient Active Problem List   Diagnosis Date Noted  . Port-A-Cath in place 11/26/2018  . Recurrent cancer of left breast ([place]) 11/13/2018  . Neuropathy due to chemotherapeutic drug ([place]) 11/13/2018  . Chest tightness   . Vertigo 05/28/2018  . Prediabetes 05/28/2018  . OSA (obstructive sleep apnea) 08/20/2016  . Snoring 08/20/2016  . Adjustment disorder with mixed anxiety and depressed mood 08/20/2016  . Genetic testing 07/07/2015  . Family history of uterine cancer   . Family history of bladder cancer   .  Abnormal AST and ALT 07/08/2014  . Fatty infiltration of liver 07/08/2014  . RUQ pain 06/27/2014  . Nonspecific (abnormal) findings on radiological and other  examination of gastrointestinal tract 06/27/2014  . Fever 06/15/2014  . Diarrhea 06/10/2014  . Chemotherapy-induced nausea 06/10/2014  . Abdominal pain 06/10/2014  . Drug induced neutropenia(288.03) 04/21/2014  . Malignant neoplasm of upper-inner quadrant of left breast in female, estrogen receptor negative ([place]) 02/11/2014  . OTHER CHEST PAIN 05/21/2010  . HYPERLIPIDEMIA 05/11/2010  . PSORIASIS 05/11/2010  . PALPITATIONS 05/11/2010  . Nonspecific (abnormal) findings on radiological and other examination of body structure 05/11/2010  . CHEST XRAY, ABNORMAL 05/11/2010  . Hypothyroidism 05/10/2010  . EPIGASTRIC PAIN 05/10/2010  . CONJUNCTIVITIS, ALLERGIC 02/18/2008  . MALAISE AND FATIGUE 01/11/2008  . Essential hypertension 09/02/2007  . SINUSITIS- ACUTE-NOS 09/02/2007  . Acute upper respiratory infection 09/02/2007  . Allergic rhinitis, cause unspecified 09/02/2007    Elsie Ra [facility] 08/16/2020, 12:56 PM  [place], [place], [address] Phone: [phone]   Fax:  [phone]  Name: Alexandria Bradley MRN: [id number] Date of Birth: [birth year]

## 2020-08-18 ENCOUNTER — Ambulatory Visit: Payer: No Typology Code available for payment source | Admitting: Rehabilitation

## 2020-08-18 ENCOUNTER — Encounter: Payer: Self-pay | Admitting: Rehabilitation

## 2020-08-18 ENCOUNTER — Other Ambulatory Visit: Payer: Self-pay

## 2020-08-18 DIAGNOSIS — Z483 Aftercare following surgery for neoplasm: Secondary | ICD-10-CM

## 2020-08-18 DIAGNOSIS — M25612 Stiffness of left shoulder, not elsewhere classified: Secondary | ICD-10-CM

## 2020-08-18 DIAGNOSIS — M25611 Stiffness of right shoulder, not elsewhere classified: Secondary | ICD-10-CM

## 2020-08-18 DIAGNOSIS — M6281 Muscle weakness (generalized): Secondary | ICD-10-CM

## 2020-08-18 DIAGNOSIS — R293 Abnormal posture: Secondary | ICD-10-CM

## 2020-08-18 NOTE — Therapy (Signed)
[place], [place], [address] Phone: [phone]   Fax:  [phone]  Physical Therapy Treatment  Patient Details  Name: Alexandria Bradley MRN: [id number] Date of Birth: [birth year] Referring Provider (PT): Dr. Jana Hakim   Encounter Date: 08/18/2020   PT End of Session - 08/18/20 0951    Visit Number 4    Number of Visits 13    Date for PT Re-Evaluation 09/19/20    PT Start Time 0902    PT Stop Time 0951    PT Time Calculation (min) 49 min    Activity Tolerance Patient tolerated treatment well    Behavior During Therapy [facility] for tasks assessed/performed           Past Medical History:  Diagnosis Date  . Anxiety   . Arthritis   . Back pain   . Breast cancer ([place])    left  . Fatty liver   . GERD (gastroesophageal reflux disease)   . Headache(784.0)    PMH : migraines  . History of kidney stones   . Hypercholesterolemia   . Hypertension   . Hypothyroidism   . Osteoporosis   . Personal history of chemotherapy   . Personal history of radiation therapy   . PONV (postoperative nausea and vomiting)    difficult to wake up  . Pre-diabetes   . Psoriasis   . PVC's (premature ventricular contractions)   . Radiation 08/18/14-10/07/14   Left breast  . Sleep apnea    no CPAP    Past Surgical History:  Procedure Laterality Date  . BREAST BIOPSY Left 11/10/2018  . BREAST LUMPECTOMY Left 2015  . BREAST LUMPECTOMY WITH AXILLARY LYMPH NODE BIOPSY  6/15   left  . BREAST RECONSTRUCTION WITH PLACEMENT OF TISSUE EXPANDER AND ALLODERM Bilateral 04/12/2019   Procedure: BILATERAL BREAST RECONSTRUCTION WITH PLACEMENT OF TISSUE EXPANDERS; ALLODERM TO RIGHT CHEST;  Surgeon: Irene Limbo, MD;  Location: [place];  Service: Plastics;  Laterality: Bilateral;  . BREAST RECONSTRUCTION WITH PLACEMENT OF TISSUE EXPANDER AND ALLODERM Right 01/24/2020   Procedure: BREAST RECONSTRUCTION WITH PLACEMENT OF TISSUE  EXPANDER AND ALLODERM;  Surgeon: Irene Limbo, MD;  Location: [place];  Service: Plastics;  Laterality: Right;  . CARDIAC CATHETERIZATION    . CHOLECYSTECTOMY    . colon polyps    . COLONOSCOPY N/A 06/27/2014   Procedure: COLONOSCOPY;  Surgeon: Lear Ng, MD;  Location[facility] END[facility]COPY;  Service: Endoscopy;  Laterality: N/A;  . LATISSIMUS FLAP TO BREAST Left 04/12/2019   Procedure: LEFT LATISSIMUS DORSI FLAP TO LEFT CHEST;  Surgeon: Irene Limbo, MD;  Location: [place];  Service: Plastics;  Laterality: Left;  . LEFT HEART CATH AND CORONARY ANGIOGRAPHY N/A 07/24/2018   Procedure: LEFT HEART CATH AND CORONARY ANGIOGRAPHY;  Surgeon: Troy Sine, MD;  Location: [place] CV LAB;  Service: Cardiovascular;  Laterality: N/A;  . LIPOMA EXCISION    . LIPOSUCTION WITH LIPOFILLING Bilateral 06/12/2020   Procedure: LIPOFILLING TO BILATERAL CHEST;  Surgeon: Irene Limbo, MD;  Location: [place];  Service: Plastics;  Laterality: Bilateral;  . MASTECTOMY W/ SENTINEL NODE BIOPSY Bilateral 04/12/2019   Procedure: RIGHT BREAST RISK REDUCING MASTECTOMY; LEFT MASTECTOMY WITH LEFT AXILLARY SENTINEL LYMPH NODE BIOPSY WITH BLUE DYE INJECTION;  Surgeon: [name], MD;  Location: [place];  Service: General;  Laterality: Bilateral;  . MYOMECTOMY    . PORT-A-CATH REMOVAL N/A 02/03/2015   Procedure: REMOVAL PORT-A-CATH;  Surgeon: [name], MD;  Location: [place];  Service: General;  Laterality: N/A;  . PORT-A-CATH REMOVAL Right 01/24/2020   Procedure: REMOVAL PORT-A-CATH;  Surgeon: Irene Limbo, MD;  Location: [place];  Service: Plastics;  Laterality: Right;  . PORTACATH PLACEMENT N/A 03/01/2014   Procedure: INSERTION PORT-A-CATH;  Surgeon: [name], MD;  Location: [place];  Service: General;  Laterality: N/A;  . PORTACATH PLACEMENT N/A 11/18/2018   Procedure: INSERTION PORT-A-CATH WITH  ULTRASOUND;  Surgeon: [name], MD;  Location: [place];  Service: General;  Laterality: N/A;  . REMOVAL OF BILATERAL TISSUE EXPANDERS WITH PLACEMENT OF BILATERAL BREAST IMPLANTS Bilateral 06/12/2020   Procedure: REMOVAL OF BILATERAL TISSUE EXPANDERS WITH PLACEMENT OF BILATERAL BREAST IMPLANTS;  Surgeon: Irene Limbo, MD;  Location: [place];  Service: Plastics;  Laterality: Bilateral;  . REMOVAL OF TISSUE EXPANDER AND PLACEMENT OF IMPLANT Right 06/02/2019   Procedure: REMOVAL OF TISSUE EXPANDER RIGHT CHEST;  Surgeon: Irene Limbo, MD;  Location: [place];  Service: Plastics;  Laterality: Right;  . TONSILLECTOMY    . TUBAL LIGATION    . WOUND DEBRIDEMENT Left 06/02/2019   Procedure: LEFT CHEST DEBRIDEMENT;  Surgeon: Irene Limbo, MD;  Location: [place];  Service: Plastics;  Laterality: Left;    There were no vitals filed for this visit.   Subjective Assessment - 08/18/20 0902    Subjective I was sore and tight under the Rt armpit after last session.  the back is still hurting today.  I also have rotator cuff issues on the Rt side.    Pertinent History bilateral mastectomies with left lat flap on 04/12/19 with left SLNB and 2 Neg. nodes.  Most recently had surgery 06/11/20 to remove expanders and place implants.  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 She has in the past had injections in her shoulder.    Patient Stated Goals Get some upper body strength back.  Reaching is difficult for her    Currently in Pain? No/denies                             [facility] Adult PT Treatment/Exercise - 08/18/20 0001      Exercises   Exercises Shoulder;Elbow      Shoulder Exercises: Supine   Flexion 10 reps    Shoulder Flexion Weight (lbs) 2    Flexion Limitations supine punch     Other Supine Exercises circles c/cc/at 90deg bil      Shoulder Exercises: Standing    External Rotation Both;10 reps    Theraband Level (Shoulder External Rotation) Level 1 (Yellow)    Row 10 reps;Both    Theraband Level (Shoulder Row) Level 1 (Yellow)    Other Standing Exercises 1# bil bicep curls x 10      Shoulder Exercises: Pulleys   Flexion 2 minutes    Flexion Limitations with vcs for reminder    ABduction 2 minutes    ABduction Limitations with vcs for reminder       Shoulder Exercises: Therapy Ball   Flexion 10 reps      Manual Therapy   Manual Lymphatic Drainage (MLD) short neck, left inguinal lymph nodes, left axillo-inguinal pathway and lateral trunk in supine and SL to decrease edema.  Allso performed on the right to Right inguinal LN, axillo inguinal pathway and right lateral trunk in supine and SL.  Passive ROM Bilateral shoulders flex, abd, ER, IR.  Multiple VC's required to get pt to relax                    PT Short Term Goals - 08/08/20 1203      PT SHORT TERM GOAL #1   Title Pt will be independant in HEP for shoulder ROM and strengthening    Time 4    Status New    Target Date 09/05/20      PT SHORT TERM GOAL #2   Title Pt will have 120 degrees of painfree  shoulder flexion and abduction so that she can perform self care easier    Time 4    Period Weeks    Status New    Target Date 09/05/20             PT Long Term Goals - 08/08/20 1204      PT LONG TERM GOAL #1   Title Right shoulder abd atleast 130 degrees for ability to dress and reach properly    Baseline 89    Time 5    Period Weeks    Status New    Target Date 09/12/20      PT LONG TERM GOAL #2   Title Pt will have bilateral shoulder ROM WNL to resume usual home activities    Time 6    Period Weeks    Status New    Target Date 09/19/20      PT LONG TERM GOAL #3   Title Pt will verbalize lymphedema risk reduction practices    Time 6    Period Days    Status New    Target Date 09/19/20      PT LONG TERM GOAL #4   Title Pt will be independent in a HEP  for strengthening so that she can return to normal activities    Time 6    Period Weeks    Status New      PT LONG TERM GOAL #5   Title Pt will decrease Quick DASH score to < 20 indicating an improvement in functional use of UE    Baseline 50    Time [place] - 08/18/20 0952    Clinical Impression Statement Began light strengtheing exercises to address feelings of shoulder weakness today mindful of prior RTC injuries and overall lower tolerance to soreness with activity.  pt tolerated all well with shakiness evident during supine punch.  Implant and lat flap with radiation on the left side evident with more tightness overall, thinner skin, and scar restriction.    PT Frequency 2x / week    PT Duration 6 weeks    PT Treatment/Interventions ADLs/Self Care Home Management;Therapeutic exercise;Neuromuscular re-education;Patient/family education;Manual lymph drainage;Manual techniques;Passive range of motion    PT Next Visit Plan how was soreness after adding resistance? if ok give pt pictures of light arm exercises, cont pROM, trunk MLD, assess lat flap site, compression bra?    Consulted and Agree with Plan of Care Patient           Patient will benefit from skilled therapeutic intervention in order to improve the following deficits and impairments:     Visit Diagnosis: Aftercare following surgery for neoplasm  Stiffness of left shoulder, not elsewhere classified  Stiffness of right shoulder, not  elsewhere classified  Muscle weakness (generalized)  Abnormal posture     Problem List Patient Active Problem List   Diagnosis Date Noted  . Port-A-Cath in place 11/26/2018  . Recurrent cancer of left breast ([place]) 11/13/2018  . Neuropathy due to chemotherapeutic drug ([place]) 11/13/2018  . Chest tightness   . Vertigo 05/28/2018  . Prediabetes 05/28/2018  . OSA (obstructive sleep apnea) 08/20/2016  . Snoring 08/20/2016  .  Adjustment disorder with mixed anxiety and depressed mood 08/20/2016  . Genetic testing 07/07/2015  . Family history of uterine cancer   . Family history of bladder cancer   . Abnormal AST and ALT 07/08/2014  . Fatty infiltration of liver 07/08/2014  . RUQ pain 06/27/2014  . Nonspecific (abnormal) findings on radiological and other examination of gastrointestinal tract 06/27/2014  . Fever 06/15/2014  . Diarrhea 06/10/2014  . Chemotherapy-induced nausea 06/10/2014  . Abdominal pain 06/10/2014  . Drug induced neutropenia(288.03) 04/21/2014  . Malignant neoplasm of upper-inner quadrant of left breast in female, estrogen receptor negative ([place]) 02/11/2014  . OTHER CHEST PAIN 05/21/2010  . HYPERLIPIDEMIA 05/11/2010  . PSORIASIS 05/11/2010  . PALPITATIONS 05/11/2010  . Nonspecific (abnormal) findings on radiological and other examination of body structure 05/11/2010  . CHEST XRAY, ABNORMAL 05/11/2010  . Hypothyroidism 05/10/2010  . EPIGASTRIC PAIN 05/10/2010  . CONJUNCTIVITIS, ALLERGIC 02/18/2008  . MALAISE AND FATIGUE 01/11/2008  . Essential hypertension 09/02/2007  . SINUSITIS- ACUTE-NOS 09/02/2007  . Acute upper respiratory infection 09/02/2007  . Allergic rhinitis, cause unspecified 09/02/2007    Stark Bray 08/18/2020, 9:55 AM  [place], [place], [address] Phone: [phone]   Fax:  [phone]  Name: Alexandria Bradley MRN: [id number] Date of Birth: 1960/11/14

## 2020-08-22 ENCOUNTER — Other Ambulatory Visit: Payer: Self-pay

## 2020-08-22 ENCOUNTER — Ambulatory Visit: Payer: No Typology Code available for payment source | Admitting: Rehabilitation

## 2020-08-22 ENCOUNTER — Encounter: Payer: Self-pay | Admitting: Rehabilitation

## 2020-08-22 DIAGNOSIS — Z483 Aftercare following surgery for neoplasm: Secondary | ICD-10-CM | POA: Diagnosis not present

## 2020-08-22 DIAGNOSIS — M25611 Stiffness of right shoulder, not elsewhere classified: Secondary | ICD-10-CM

## 2020-08-22 DIAGNOSIS — R293 Abnormal posture: Secondary | ICD-10-CM

## 2020-08-22 DIAGNOSIS — M25612 Stiffness of left shoulder, not elsewhere classified: Secondary | ICD-10-CM

## 2020-08-22 DIAGNOSIS — M6281 Muscle weakness (generalized): Secondary | ICD-10-CM

## 2020-08-22 NOTE — Patient Instructions (Signed)
Access Code: [email or web address]: 11/23/2021Prepared by: Marcene Brawn TevisExercises  Doorway Pec Stretch at 90 Degrees Abduction - 1 x daily - 7 x weekly - 1-3 sets - 10 reps - 20-30 seconds hold  Standing Overhead Triceps Stretch - 1 x daily - 7 x weekly - 1-3 sets - 10 reps - 20-30 seconds hold  Shoulder Abduction with Dumbbells - Palms Down - 1 x daily - 3-4 x weekly - 1-3 sets - 10 reps - no hold  Standing Row with Anchored Resistance - 1 x daily - 3-4 x weekly - 1-3 sets - 10 reps  Supine Shoulder Horizontal Abduction with Resistance - 1 x daily - 3-4 x weekly - 1-3 sets - 10 reps  Supine Shoulder External Rotation with Resistance - 1 x daily - 3-4 x weekly - 1-3 sets - 10 reps  Supine Chest Press with Dumbbells - 1 x daily - 3-4 x weekly - 1-3 sets - 10 reps - no hold  Seated Bicep Curls Supinated with Dumbbells - 1 x daily - 3-4 x weekly - 1-3 sets - 10 reps - no hold

## 2020-08-22 NOTE — Therapy (Signed)
[place], [place], [address] Phone: [phone]   Fax:  [phone]  Physical Therapy Treatment  Patient Details  Name: Alexandria Bradley MRN: [id number] Date of Birth: 02-07-1961 Referring Provider (PT): Dr. Jana Hakim   Encounter Date: 08/22/2020   PT End of Session - 08/22/20 1354    Visit Number 5    Number of Visits 13    Date for PT Re-Evaluation 09/19/20    PT Start Time 1301    PT Stop Time 1354    PT Time Calculation (min) 53 min    Activity Tolerance Patient tolerated treatment well    Behavior During Therapy [facility] for tasks assessed/performed           Past Medical History:  Diagnosis Date  . Anxiety   . Arthritis   . Back pain   . Breast cancer ([place])    left  . Fatty liver   . GERD (gastroesophageal reflux disease)   . Headache(784.0)    PMH : migraines  . History of kidney stones   . Hypercholesterolemia   . Hypertension   . Hypothyroidism   . Osteoporosis   . Personal history of chemotherapy   . Personal history of radiation therapy   . PONV (postoperative nausea and vomiting)    difficult to wake up  . Pre-diabetes   . Psoriasis   . PVC's (premature ventricular contractions)   . Radiation 08/18/14-10/07/14   Left breast  . Sleep apnea    no CPAP    Past Surgical History:  Procedure Laterality Date  . BREAST BIOPSY Left 11/10/2018  . BREAST LUMPECTOMY Left 2015  . BREAST LUMPECTOMY WITH AXILLARY LYMPH NODE BIOPSY  6/15   left  . BREAST RECONSTRUCTION WITH PLACEMENT OF TISSUE EXPANDER AND ALLODERM Bilateral 04/12/2019   Procedure: BILATERAL BREAST RECONSTRUCTION WITH PLACEMENT OF TISSUE EXPANDERS; ALLODERM TO RIGHT CHEST;  Surgeon: Irene Limbo, MD;  Location: [place];  Service: Plastics;  Laterality: Bilateral;  . BREAST RECONSTRUCTION WITH PLACEMENT OF TISSUE EXPANDER AND ALLODERM Right 01/24/2020   Procedure: BREAST RECONSTRUCTION WITH PLACEMENT OF TISSUE  EXPANDER AND ALLODERM;  Surgeon: Irene Limbo, MD;  Location: [place];  Service: Plastics;  Laterality: Right;  . CARDIAC CATHETERIZATION    . CHOLECYSTECTOMY    . colon polyps    . COLONOSCOPY N/A 06/27/2014   Procedure: COLONOSCOPY;  Surgeon: Lear Ng, MD;  Location: [facility] ENDOSCOPY;  Service: Endoscopy;  Laterality: N/A;  . LATISSIMUS FLAP TO BREAST Left 04/12/2019   Procedure: LEFT LATISSIMUS DORSI FLAP TO LEFT CHEST;  Surgeon: Irene Limbo, MD;  Location: [place];  Service: Plastics;  Laterality: Left;  . LEFT HEART CATH AND CORONARY ANGIOGRAPHY N/A 07/24/2018   Procedure: LEFT HEART CATH AND CORONARY ANGIOGRAPHY;  Surgeon: Troy Sine, MD;  Location: [place] CV LAB;  Service: Cardiovascular;  Laterality: N/A;  . LIPOMA EXCISION    . LIPOSUCTION WITH LIPOFILLING Bilateral 06/12/2020   Procedure: LIPOFILLING TO BILATERAL CHEST;  Surgeon: Irene Limbo, MD;  Location: [place];  Service: Plastics;  Laterality: Bilateral;  . MASTECTOMY W/ SENTINEL NODE BIOPSY Bilateral 04/12/2019   Procedure: RIGHT BREAST RISK REDUCING MASTECTOMY; LEFT MASTECTOMY WITH LEFT AXILLARY SENTINEL LYMPH NODE BIOPSY WITH BLUE DYE INJECTION;  Surgeon: [name], MD;  Location: [place];  Service: General;  Laterality: Bilateral;  . MYOMECTOMY    . PORT-A-CATH REMOVAL N/A 02/03/2015   Procedure: REMOVAL PORT-A-CATH;  Surgeon: [name], MD;  Location: [place];  Service: General;  Laterality: N/A;  . PORT-A-CATH REMOVAL Right 01/24/2020   Procedure: REMOVAL PORT-A-CATH;  Surgeon: Irene Limbo, MD;  Location: [place];  Service: Plastics;  Laterality: Right;  . PORTACATH PLACEMENT N/A 03/01/2014   Procedure: INSERTION PORT-A-CATH;  Surgeon: [name], MD;  Location: [place];  Service: General;  Laterality: N/A;  . PORTACATH PLACEMENT N/A 11/18/2018   Procedure: INSERTION PORT-A-CATH WITH  ULTRASOUND;  Surgeon: [name], MD;  Location: [place];  Service: General;  Laterality: N/A;  . REMOVAL OF BILATERAL TISSUE EXPANDERS WITH PLACEMENT OF BILATERAL BREAST IMPLANTS Bilateral 06/12/2020   Procedure: REMOVAL OF BILATERAL TISSUE EXPANDERS WITH PLACEMENT OF BILATERAL BREAST IMPLANTS;  Surgeon: Irene Limbo, MD;  Location: [place];  Service: Plastics;  Laterality: Bilateral;  . REMOVAL OF TISSUE EXPANDER AND PLACEMENT OF IMPLANT Right 06/02/2019   Procedure: REMOVAL OF TISSUE EXPANDER RIGHT CHEST;  Surgeon: Irene Limbo, MD;  Location: [place];  Service: Plastics;  Laterality: Right;  . TONSILLECTOMY    . TUBAL LIGATION    . WOUND DEBRIDEMENT Left 06/02/2019   Procedure: LEFT CHEST DEBRIDEMENT;  Surgeon: Irene Limbo, MD;  Location: [place];  Service: Plastics;  Laterality: Left;    There were no vitals filed for this visit.   Subjective Assessment - 08/22/20 1301    Subjective I was not too sore. It was fine    Pertinent History bilateral mastectomies with left lat flap on 04/12/19 with left SLNB and 2 Neg. nodes.  Most recently had surgery 06/11/20 to remove expanders and place implants.  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 She has in the past had injections in her shoulder.    Patient Stated Goals Get some upper body strength back.  Reaching is difficult for her    Currently in Pain? No/denies                             [facility] Adult PT Treatment/Exercise - 08/22/20 0001      Shoulder Exercises: Supine   Horizontal ABduction Both;10 reps    Theraband Level (Shoulder Horizontal ABduction) Level 1 (Yellow)    External Rotation Both;15 reps    Theraband Level (Shoulder External Rotation) Level 1 (Yellow)    Diagonals Left;10 reps    Diagonals Limitations pain with attempt on Rt so stopped for today       Shoulder Exercises: Standing    Row 20 reps    Theraband Level (Shoulder Row) Level 1 (Yellow)      Shoulder Exercises: Stretch   Corner Stretch 2 reps    Corner Stretch Limitations doorway stretch    Other Shoulder Stretches tricep stretch bil 2x20"       Manual Therapy   Manual Lymphatic Drainage (MLD) short neck, bil inguinal lymph nodes, bil axillo-inguinal pathway and lateral trunk in supine. No edema noticed today    Passive ROM Bilateral shoulders flex, abd, ER, IR.  Multiple VC's required to get pt to relax                    PT Short Term Goals - 08/08/20 1203      PT SHORT TERM GOAL #1   Title Pt will be independant in HEP for shoulder ROM and strengthening    Time 4    Status New  Target Date 09/05/20      PT SHORT TERM GOAL #2   Title Pt will have 120 degrees of painfree  shoulder flexion and abduction so that she can perform self care easier    Time 4    Period Weeks    Status New    Target Date 09/05/20             PT Long Term Goals - 08/08/20 1204      PT LONG TERM GOAL #1   Title Right shoulder abd atleast 130 degrees for ability to dress and reach properly    Baseline 89    Time 5    Period Weeks    Status New    Target Date 09/12/20      PT LONG TERM GOAL #2   Title Pt will have bilateral shoulder ROM WNL to resume usual home activities    Time 6    Period Weeks    Status New    Target Date 09/19/20      PT LONG TERM GOAL #3   Title Pt will verbalize lymphedema risk reduction practices    Time 6    Period Days    Status New    Target Date 09/19/20      PT LONG TERM GOAL #4   Title Pt will be independent in a HEP for strengthening so that she can return to normal activities    Time 6    Period Weeks    Status New      PT LONG TERM GOAL #5   Title Pt will decrease Quick DASH score to < 20 indicating an improvement in functional use of UE    Baseline 50    Time 6    Period Weeks    Status New                 Plan - 08/22/20 1354     Clinical Impression Statement pt had no increased soreness after last session introducing strengthening for the UE so continued this today and added to HEP.  Pt was unable to do diagnol motion due to impingement on the Rt side so this was omitted.  See instruction section for exercises.  Discussed how to increase weight safely, how to increase sets and reps and how to modify if shoulderpain increases.  No significant edema today on the trunk and pt also reporting this seems better.  Pt has one morevisit and was instructed to think about more vists vs HEP    PT Frequency 2x / week    PT Duration 6 weeks    PT Treatment/Interventions ADLs/Self Care Home Management;Therapeutic exercise;Neuromuscular re-education;Patient/family education;Manual lymph drainage;Manual techniques;Passive range of motion    PT Next Visit Plan continue shoulder stability exercises and AROM/AAROM work, more visits or HEP ?    PT Home Exercise Plan [phone]    Consulted and Agree with Plan of Care Patient           Patient will benefit from skilled therapeutic intervention in order to improve the following deficits and impairments:     Visit Diagnosis: Aftercare following surgery for neoplasm  Stiffness of left shoulder, not elsewhere classified  Stiffness of right shoulder, not elsewhere classified  Muscle weakness (generalized)  Abnormal posture     Problem List Patient Active Problem List   Diagnosis Date Noted  . Port-A-Cath in place 11/26/2018  . Recurrent cancer of left breast ([place]) 11/13/2018  . Neuropathy due to chemotherapeutic  drug ([place]) 11/13/2018  . Chest tightness   . Vertigo 05/28/2018  . Prediabetes 05/28/2018  . OSA (obstructive sleep apnea) 08/20/2016  . Snoring 08/20/2016  . Adjustment disorder with mixed anxiety and depressed mood 08/20/2016  . Genetic testing 07/07/2015  . Family history of uterine cancer   . Family history of bladder cancer   . Abnormal AST and ALT 07/08/2014  .  Fatty infiltration of liver 07/08/2014  . RUQ pain 06/27/2014  . Nonspecific (abnormal) findings on radiological and other examination of gastrointestinal tract 06/27/2014  . Fever 06/15/2014  . Diarrhea 06/10/2014  . Chemotherapy-induced nausea 06/10/2014  . Abdominal pain 06/10/2014  . Drug induced neutropenia(288.03) 04/21/2014  . Malignant neoplasm of upper-inner quadrant of left breast in female, estrogen receptor negative ([place]) 02/11/2014  . OTHER CHEST PAIN 05/21/2010  . HYPERLIPIDEMIA 05/11/2010  . PSORIASIS 05/11/2010  . PALPITATIONS 05/11/2010  . Nonspecific (abnormal) findings on radiological and other examination of body structure 05/11/2010  . CHEST XRAY, ABNORMAL 05/11/2010  . Hypothyroidism 05/10/2010  . EPIGASTRIC PAIN 05/10/2010  . CONJUNCTIVITIS, ALLERGIC 02/18/2008  . MALAISE AND FATIGUE 01/11/2008  . Essential hypertension 09/02/2007  . SINUSITIS- ACUTE-NOS 09/02/2007  . Acute upper respiratory infection 09/02/2007  . Allergic rhinitis, cause unspecified 09/02/2007    Alexandria Bradley 08/22/2020, 1:58 PM  [place], [place], [address] Phone: [phone]   Fax:  [phone]  Name: Alexandria Bradley MRN: [id number] Date of Birth: Mar 01, 1961

## 2020-08-29 ENCOUNTER — Ambulatory Visit: Payer: No Typology Code available for payment source

## 2020-08-29 ENCOUNTER — Other Ambulatory Visit: Payer: Self-pay

## 2020-08-29 DIAGNOSIS — M6281 Muscle weakness (generalized): Secondary | ICD-10-CM

## 2020-08-29 DIAGNOSIS — M25611 Stiffness of right shoulder, not elsewhere classified: Secondary | ICD-10-CM

## 2020-08-29 DIAGNOSIS — R293 Abnormal posture: Secondary | ICD-10-CM

## 2020-08-29 DIAGNOSIS — Z483 Aftercare following surgery for neoplasm: Secondary | ICD-10-CM

## 2020-08-29 DIAGNOSIS — M25612 Stiffness of left shoulder, not elsewhere classified: Secondary | ICD-10-CM

## 2020-08-29 NOTE — Therapy (Signed)
[place], [place], [address] Phone: [phone]   Fax:  [phone]  Physical Therapy Treatment  Patient Details  Name: Alexandria Bradley MRN: [id number] Date of Birth: 1960-10-05 Referring Provider (PT): Dr. Jana Hakim   Encounter Date: 08/29/2020   PT End of Session - 08/29/20 1129    Visit Number 6    Number of Visits 13    Date for PT Re-Evaluation 09/19/20    PT Start Time 0902    PT Stop Time 0956    PT Time Calculation (min) 54 min    Activity Tolerance Patient tolerated treatment well    Behavior During Therapy [facility] for tasks assessed/performed           Past Medical History:  Diagnosis Date  . Anxiety   . Arthritis   . Back pain   . Breast cancer ([place])    left  . Fatty liver   . GERD (gastroesophageal reflux disease)   . Headache(784.0)    PMH : migraines  . History of kidney stones   . Hypercholesterolemia   . Hypertension   . Hypothyroidism   . Osteoporosis   . Personal history of chemotherapy   . Personal history of radiation therapy   . PONV (postoperative nausea and vomiting)    difficult to wake up  . Pre-diabetes   . Psoriasis   . PVC's (premature ventricular contractions)   . Radiation 08/18/14-10/07/14   Left breast  . Sleep apnea    no CPAP    Past Surgical History:  Procedure Laterality Date  . BREAST BIOPSY Left 11/10/2018  . BREAST LUMPECTOMY Left 2015  . BREAST LUMPECTOMY WITH AXILLARY LYMPH NODE BIOPSY  6/15   left  . BREAST RECONSTRUCTION WITH PLACEMENT OF TISSUE EXPANDER AND ALLODERM Bilateral 04/12/2019   Procedure: BILATERAL BREAST RECONSTRUCTION WITH PLACEMENT OF TISSUE EXPANDERS; ALLODERM TO RIGHT CHEST;  Surgeon: Irene Limbo, MD;  Location: [place];  Service: Plastics;  Laterality: Bilateral;  . BREAST RECONSTRUCTION WITH PLACEMENT OF TISSUE EXPANDER AND ALLODERM Right 01/24/2020   Procedure: BREAST RECONSTRUCTION WITH PLACEMENT OF TISSUE  EXPANDER AND ALLODERM;  Surgeon: Irene Limbo, MD;  Location: [place];  Service: Plastics;  Laterality: Right;  . CARDIAC CATHETERIZATION    . CHOLECYSTECTOMY    . colon polyps    . COLONOSCOPY N/A 06/27/2014   Procedure: COLONOSCOPY;  Surgeon: Lear Ng, MD;  Location: [facility] ENDOSCOPY;  Service: Endoscopy;  Laterality: N/A;  . LATISSIMUS FLAP TO BREAST Left 04/12/2019   Procedure: LEFT LATISSIMUS DORSI FLAP TO LEFT CHEST;  Surgeon: Irene Limbo, MD;  Location: [place];  Service: Plastics;  Laterality: Left;  . LEFT HEART CATH AND CORONARY ANGIOGRAPHY N/A 07/24/2018   Procedure: LEFT HEART CATH AND CORONARY ANGIOGRAPHY;  Surgeon: Troy Sine, MD;  Location: [place] CV LAB;  Service: Cardiovascular;  Laterality: N/A;  . LIPOMA EXCISION    . LIPOSUCTION WITH LIPOFILLING Bilateral 06/12/2020   Procedure: LIPOFILLING TO BILATERAL CHEST;  Surgeon: Irene Limbo, MD;  Location: [place];  Service: Plastics;  Laterality: Bilateral;  . MASTECTOMY W/ SENTINEL NODE BIOPSY Bilateral 04/12/2019   Procedure: RIGHT BREAST RISK REDUCING MASTECTOMY; LEFT MASTECTOMY WITH LEFT AXILLARY SENTINEL LYMPH NODE BIOPSY WITH BLUE DYE INJECTION;  Surgeon: [name], MD;  Location: [place];  Service: General;  Laterality: Bilateral;  . MYOMECTOMY    . PORT-A-CATH REMOVAL N/A 02/03/2015   Procedure: REMOVAL PORT-A-CATH;  Surgeon: [name], MD;  Location: [place];  Service: General;  Laterality: N/A;  . PORT-A-CATH REMOVAL Right 01/24/2020   Procedure: REMOVAL PORT-A-CATH;  Surgeon: Irene Limbo, MD;  Location: [place];  Service: Plastics;  Laterality: Right;  . PORTACATH PLACEMENT N/A 03/01/2014   Procedure: INSERTION PORT-A-CATH;  Surgeon: [name], MD;  Location: [place];  Service: General;  Laterality: N/A;  . PORTACATH PLACEMENT N/A 11/18/2018   Procedure: INSERTION PORT-A-CATH WITH  ULTRASOUND;  Surgeon: [name], MD;  Location: [place];  Service: General;  Laterality: N/A;  . REMOVAL OF BILATERAL TISSUE EXPANDERS WITH PLACEMENT OF BILATERAL BREAST IMPLANTS Bilateral 06/12/2020   Procedure: REMOVAL OF BILATERAL TISSUE EXPANDERS WITH PLACEMENT OF BILATERAL BREAST IMPLANTS;  Surgeon: Irene Limbo, MD;  Location: [place];  Service: Plastics;  Laterality: Bilateral;  . REMOVAL OF TISSUE EXPANDER AND PLACEMENT OF IMPLANT Right 06/02/2019   Procedure: REMOVAL OF TISSUE EXPANDER RIGHT CHEST;  Surgeon: Irene Limbo, MD;  Location: [place];  Service: Plastics;  Laterality: Right;  . TONSILLECTOMY    . TUBAL LIGATION    . WOUND DEBRIDEMENT Left 06/02/2019   Procedure: LEFT CHEST DEBRIDEMENT;  Surgeon: Irene Limbo, MD;  Location: [place];  Service: Plastics;  Laterality: Left;    There were no vitals filed for this visit.   Subjective Assessment - 08/29/20 0902    Subjective Doing pretty well but didn't do alot of exercises over the holiday. Think I slept wrong one night and my right upper back is sore. It might be coming from my neck. the tightness and soreness is much better now. Swelling is really pretty good,  I saw Dr Iran Planas yesterday and I am considering another surgery.    Pertinent History bilateral mastectomies with left lat flap on 04/12/19 with left SLNB and 2 Neg. nodes.  Most recently had surgery 06/11/20 to remove expanders and place implants.  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 She has in the past had injections in her shoulder.    Patient Stated Goals Get some upper body strength back.  Reaching is difficult for her    Currently in Pain? Yes    Pain Score 3     Pain Location Neck    Pain Orientation Right    Pain Descriptors / Indicators Tender    Pain Type Acute pain    Pain Onset In the past 7 days    Aggravating Factors  laying on  right side, reaching    Pain Score 3    Pain Location Shoulder    Pain Orientation Right    Pain Descriptors / Indicators Tender;Aching    Pain Onset More than a month ago    Pain Frequency Intermittent                             [facility] Adult PT Treatment/Exercise - 08/29/20 0001      Shoulder Exercises: Supine   Horizontal ABduction Both;10 reps    Theraband Level (Shoulder Horizontal ABduction) Level 1 (Yellow)      Shoulder Exercises: Standing   External Rotation Strengthening;Both;10 reps    Theraband Level (Shoulder External Rotation) Level 1 (Yellow)    Extension Both;20 reps    Theraband Level (Shoulder Extension) Level 1 (Yellow)    Row 20 reps    Theraband Level (Shoulder Row) Level 1 (Yellow)  Other Standing Exercises 3 # biceps curls x 10      Shoulder Exercises: Pulleys   Flexion 2 minutes    Scaption 2 minutes      Shoulder Exercises: Therapy Ball   Flexion 5 reps    Other Therapy Ball Exercises wall stabs flex x 10 ea      Manual Therapy   Passive ROM Bilateral shoulders flex, abd, ER, IR.  Multiple VC's required to get pt to relax                    PT Short Term Goals - 08/08/20 1203      PT SHORT TERM GOAL #1   Title Pt will be independant in HEP for shoulder ROM and strengthening    Time 4    Status New    Target Date 09/05/20      PT SHORT TERM GOAL #2   Title Pt will have 120 degrees of painfree  shoulder flexion and abduction so that she can perform self care easier    Time 4    Period Weeks    Status New    Target Date 09/05/20             PT Long Term Goals - 08/08/20 1204      PT LONG TERM GOAL #1   Title Right shoulder abd atleast 130 degrees for ability to dress and reach properly    Baseline 89    Time 5    Period Weeks    Status New    Target Date 09/12/20      PT LONG TERM GOAL #2   Title Pt will have bilateral shoulder ROM WNL to resume usual home activities    Time 6    Period Weeks     Status New    Target Date 09/19/20      PT LONG TERM GOAL #3   Title Pt will verbalize lymphedema risk reduction practices    Time 6    Period Days    Status New    Target Date 09/19/20      PT LONG TERM GOAL #4   Title Pt will be independent in a HEP for strengthening so that she can return to normal activities    Time 6    Period Weeks    Status New      PT LONG TERM GOAL #5   Title Pt will decrease Quick DASH score to < 20 indicating an improvement in functional use of UE    Baseline 50    Time 6    Period Weeks    Status New                 Plan - 08/29/20 1129    Clinical Impression Statement Pts. shoulder ROM improves nicely after using the pulleys.  She has good tolerance for theraband exercises, and shoulder strengthening is progressing.  She continues with intermittent Right shoulder pain and was having some right scapular pain today that seemed related to her neck.  We held ball rolls on wall at 5 because of increased right scapular pain, that later resolved..  Pt would like to continue to come 1x per week for several more weeks.   Personal Factors and Comorbidities Comorbidity 2    Comorbidities recurrent breast CA, Infection right breast requiring initial removal of expander, infection risk    Examination-Activity Limitations Dressing;Lift;Reach Overhead;Carry    Stability/Clinical Decision Making Stable/Uncomplicated  Clinical Decision Making Low    Rehab Potential Good    PT Frequency 1x / week    PT Duration 6 weeks    PT Treatment/Interventions ADLs/Self Care Home Management;Therapeutic exercise;Neuromuscular re-education;Patient/family education;Manual lymph drainage;Manual techniques;Passive range of motion    PT Next Visit Plan continue shoulder strength/stability, AAROM, AROM .  Will continue 1x/week    Consulted and Agree with Plan of Care Patient           Patient will benefit from skilled therapeutic intervention in order to improve the  following deficits and impairments:  Decreased endurance, Decreased knowledge of precautions, Decreased activity tolerance, Impaired UE functional use, Postural dysfunction, Pain, Increased edema, Decreased scar mobility, Decreased range of motion  Visit Diagnosis: Aftercare following surgery for neoplasm  Stiffness of left shoulder, not elsewhere classified  Stiffness of right shoulder, not elsewhere classified  Muscle weakness (generalized)  Abnormal posture     Problem List Patient Active Problem List   Diagnosis Date Noted  . Port-A-Cath in place 11/26/2018  . Recurrent cancer of left breast ([place]) 11/13/2018  . Neuropathy due to chemotherapeutic drug ([place]) 11/13/2018  . Chest tightness   . Vertigo 05/28/2018  . Prediabetes 05/28/2018  . OSA (obstructive sleep apnea) 08/20/2016  . Snoring 08/20/2016  . Adjustment disorder with mixed anxiety and depressed mood 08/20/2016  . Genetic testing 07/07/2015  . Family history of uterine cancer   . Family history of bladder cancer   . Abnormal AST and ALT 07/08/2014  . Fatty infiltration of liver 07/08/2014  . RUQ pain 06/27/2014  . Nonspecific (abnormal) findings on radiological and other examination of gastrointestinal tract 06/27/2014  . Fever 06/15/2014  . Diarrhea 06/10/2014  . Chemotherapy-induced nausea 06/10/2014  . Abdominal pain 06/10/2014  . Drug induced neutropenia(288.03) 04/21/2014  . Malignant neoplasm of upper-inner quadrant of left breast in female, estrogen receptor negative ([place]) 02/11/2014  . OTHER CHEST PAIN 05/21/2010  . HYPERLIPIDEMIA 05/11/2010  . PSORIASIS 05/11/2010  . PALPITATIONS 05/11/2010  . Nonspecific (abnormal) findings on radiological and other examination of body structure 05/11/2010  . CHEST XRAY, ABNORMAL 05/11/2010  . Hypothyroidism 05/10/2010  . EPIGASTRIC PAIN 05/10/2010  . CONJUNCTIVITIS, ALLERGIC 02/18/2008  . MALAISE AND FATIGUE 01/11/2008  . Essential hypertension 09/02/2007  .  SINUSITIS- ACUTE-NOS 09/02/2007  . Acute upper respiratory infection 09/02/2007  . Allergic rhinitis, cause unspecified 09/02/2007    Elsie Ra [facility] 08/29/2020, 11:40 AM  [place], [place], [address] Phone: [phone]   Fax:  [phone]  Name: NUVIA HILEMAN MRN: [id number] Date of Birth: Jan 17, 1961 Cheral Almas, PT 08/29/20 11:42 AM

## 2020-09-05 ENCOUNTER — Ambulatory Visit: Payer: No Typology Code available for payment source | Attending: Oncology

## 2020-09-05 ENCOUNTER — Other Ambulatory Visit: Payer: Self-pay

## 2020-09-05 DIAGNOSIS — R293 Abnormal posture: Secondary | ICD-10-CM | POA: Diagnosis present

## 2020-09-05 DIAGNOSIS — Z483 Aftercare following surgery for neoplasm: Secondary | ICD-10-CM | POA: Insufficient documentation

## 2020-09-05 DIAGNOSIS — M25611 Stiffness of right shoulder, not elsewhere classified: Secondary | ICD-10-CM | POA: Diagnosis present

## 2020-09-05 DIAGNOSIS — M25612 Stiffness of left shoulder, not elsewhere classified: Secondary | ICD-10-CM | POA: Insufficient documentation

## 2020-09-05 DIAGNOSIS — M6281 Muscle weakness (generalized): Secondary | ICD-10-CM | POA: Diagnosis present

## 2020-09-05 NOTE — Therapy (Signed)
[place], [place], [address] Phone: [phone]   Fax:  [phone]  Physical Therapy Treatment  Patient Details  Name: Alexandria Bradley MRN: [id number] Date of Birth: Apr 09, 1961 Referring Provider (PT): Dr. Jana Hakim   Encounter Date: 09/05/2020   PT End of Session - 09/05/20 1029    Visit Number 7    Number of Visits 13    Date for PT Re-Evaluation 09/19/20    PT Start Time 1001    PT Stop Time 1051    PT Time Calculation (min) 50 min    Activity Tolerance Patient tolerated treatment well    Behavior During Therapy [facility] for tasks assessed/performed           Past Medical History:  Diagnosis Date  . Anxiety   . Arthritis   . Back pain   . Breast cancer ([place])    left  . Fatty liver   . GERD (gastroesophageal reflux disease)   . Headache(784.0)    PMH : migraines  . History of kidney stones   . Hypercholesterolemia   . Hypertension   . Hypothyroidism   . Osteoporosis   . Personal history of chemotherapy   . Personal history of radiation therapy   . PONV (postoperative nausea and vomiting)    difficult to wake up  . Pre-diabetes   . Psoriasis   . PVC's (premature ventricular contractions)   . Radiation 08/18/14-10/07/14   Left breast  . Sleep apnea    no CPAP    Past Surgical History:  Procedure Laterality Date  . BREAST BIOPSY Left 11/10/2018  . BREAST LUMPECTOMY Left 2015  . BREAST LUMPECTOMY WITH AXILLARY LYMPH NODE BIOPSY  6/15   left  . BREAST RECONSTRUCTION WITH PLACEMENT OF TISSUE EXPANDER AND ALLODERM Bilateral 04/12/2019   Procedure: BILATERAL BREAST RECONSTRUCTION WITH PLACEMENT OF TISSUE EXPANDERS; ALLODERM TO RIGHT CHEST;  Surgeon: Irene Limbo, MD;  Location: [place];  Service: Plastics;  Laterality: Bilateral;  . BREAST RECONSTRUCTION WITH PLACEMENT OF TISSUE EXPANDER AND ALLODERM Right 01/24/2020   Procedure: BREAST RECONSTRUCTION WITH PLACEMENT OF TISSUE  EXPANDER AND ALLODERM;  Surgeon: Irene Limbo, MD;  Location: [place];  Service: Plastics;  Laterality: Right;  . CARDIAC CATHETERIZATION    . CHOLECYSTECTOMY    . colon polyps    . COLONOSCOPY N/A 06/27/2014   Procedure: COLONOSCOPY;  Surgeon: Lear Ng, MD;  Location: [facility] ENDOSCOPY;  Service: Endoscopy;  Laterality: N/A;  . LATISSIMUS FLAP TO BREAST Left 04/12/2019   Procedure: LEFT LATISSIMUS DORSI FLAP TO LEFT CHEST;  Surgeon: Irene Limbo, MD;  Location: [place];  Service: Plastics;  Laterality: Left;  . LEFT HEART CATH AND CORONARY ANGIOGRAPHY N/A 07/24/2018   Procedure: LEFT HEART CATH AND CORONARY ANGIOGRAPHY;  Surgeon: Troy Sine, MD;  Location: [place] CV LAB;  Service: Cardiovascular;  Laterality: N/A;  . LIPOMA EXCISION    . LIPOSUCTION WITH LIPOFILLING Bilateral 06/12/2020   Procedure: LIPOFILLING TO BILATERAL CHEST;  Surgeon: Irene Limbo, MD;  Location: [place];  Service: Plastics;  Laterality: Bilateral;  . MASTECTOMY W/ SENTINEL NODE BIOPSY Bilateral 04/12/2019   Procedure: RIGHT BREAST RISK REDUCING MASTECTOMY; LEFT MASTECTOMY WITH LEFT AXILLARY SENTINEL LYMPH NODE BIOPSY WITH BLUE DYE INJECTION;  Surgeon: [name], MD;  Location: [place];  Service: General;  Laterality: Bilateral;  . MYOMECTOMY    . PORT-A-CATH REMOVAL N/A 02/03/2015   Procedure: REMOVAL PORT-A-CATH;  Surgeon: [name], MD;  Location: [place];  Service: General;  Laterality: N/A;  . PORT-A-CATH REMOVAL Right 01/24/2020   Procedure: REMOVAL PORT-A-CATH;  Surgeon: Irene Limbo, MD;  Location: [place];  Service: Plastics;  Laterality: Right;  . PORTACATH PLACEMENT N/A 03/01/2014   Procedure: INSERTION PORT-A-CATH;  Surgeon: [name], MD;  Location: [place];  Service: General;  Laterality: N/A;  . PORTACATH PLACEMENT N/A 11/18/2018   Procedure: INSERTION PORT-A-CATH WITH  ULTRASOUND;  Surgeon: [name], MD;  Location: [place];  Service: General;  Laterality: N/A;  . REMOVAL OF BILATERAL TISSUE EXPANDERS WITH PLACEMENT OF BILATERAL BREAST IMPLANTS Bilateral 06/12/2020   Procedure: REMOVAL OF BILATERAL TISSUE EXPANDERS WITH PLACEMENT OF BILATERAL BREAST IMPLANTS;  Surgeon: Irene Limbo, MD;  Location: [place];  Service: Plastics;  Laterality: Bilateral;  . REMOVAL OF TISSUE EXPANDER AND PLACEMENT OF IMPLANT Right 06/02/2019   Procedure: REMOVAL OF TISSUE EXPANDER RIGHT CHEST;  Surgeon: Irene Limbo, MD;  Location: [place];  Service: Plastics;  Laterality: Right;  . TONSILLECTOMY    . TUBAL LIGATION    . WOUND DEBRIDEMENT Left 06/02/2019   Procedure: LEFT CHEST DEBRIDEMENT;  Surgeon: Irene Limbo, MD;  Location: [place];  Service: Plastics;  Laterality: Left;    There were no vitals filed for this visit.   Subjective Assessment - 09/05/20 1000    Subjective Things are doing pretty well.  Trying to keep up with the exercises.  Right shoulder seems to be doing better. No present pain    Pertinent History bilateral mastectomies with left lat flap on 04/12/19 with left SLNB and 2 Neg. nodes.  Most recently had surgery 06/11/20 to remove expanders and place implants.  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 She has in the past had injections in her shoulder.    Currently in Pain? No/denies    Pain Score 0-No pain                             [facility] Adult PT Treatment/Exercise - 09/05/20 0001      Shoulder Exercises: Supine   Horizontal ABduction Both;10 reps    Theraband Level (Shoulder Horizontal ABduction) Level 1 (Yellow)    Other Supine Exercises ABC's 1 # done on right and left     Shoulder Exercises: Standing   External Rotation Strengthening;Both;12 reps    Theraband Level (Shoulder External Rotation) Level 1 (Yellow)     Extension Strengthening;Both;10 reps   red TB   Theraband Level (Shoulder Extension) Level 2 (Red)   1 set yellow Level 1   Row Strengthening;Both    Theraband Level (Shoulder Row) Level 2 (Red)    Other Standing Exercises 3 #biceps curls 2 x 10      Shoulder Exercises: Pulleys   Flexion 2 minutes    Scaption 2 minutes      Shoulder Exercises: Therapy Ball   Other Therapy Ball Exercises wall stabs 2 D x 10 ea      Shoulder Exercises: ROM/Strengthening   Other ROM/Strengthening Exercises jobes flex and scaption 1# x10 bilaterally                   PT Short Term Goals - 08/08/20 1203      PT SHORT TERM GOAL #1   Title Pt will be independant in HEP for shoulder ROM and strengthening  Time 4    Status New    Target Date 09/05/20      PT SHORT TERM GOAL #2   Title Pt will have 120 degrees of painfree  shoulder flexion and abduction so that she can perform self care easier    Time 4    Period Weeks    Status New    Target Date 09/05/20             PT Long Term Goals - 08/08/20 1204      PT LONG TERM GOAL #1   Title Right shoulder abd atleast 130 degrees for ability to dress and reach properly    Baseline 89    Time 5    Period Weeks    Status New    Target Date 09/12/20      PT LONG TERM GOAL #2   Title Pt will have bilateral shoulder ROM WNL to resume usual home activities    Time 6    Period Weeks    Status New    Target Date 09/19/20      PT LONG TERM GOAL #3   Title Pt will verbalize lymphedema risk reduction practices    Time 6    Period Days    Status New    Target Date 09/19/20      PT LONG TERM GOAL #4   Title Pt will be independent in a HEP for strengthening so that she can return to normal activities    Time 6    Period Weeks    Status New      PT LONG TERM GOAL #5   Title Pt will decrease Quick DASH score to < 20 indicating an improvement in functional use of UE    Baseline 50    Time 6    Period Weeks    Status New                   Plan - 09/05/20 1030    Clinical Impression Statement Pts ROM continues to do well and she was able to progress repetitions with most exercises without increased pain.  She did fatigue easily with jobes exs in standing. Pt relaxed very well for PROM today.  Still having intermittent chest swelling    Comorbidities recurrent breast CA, Infection right breast requiring initial removal of expander, infection risk    Examination-Activity Limitations Dressing;Lift;Reach Overhead;Carry    Stability/Clinical Decision Making Stable/Uncomplicated    Clinical Decision Making Low    Rehab Potential Good    PT Frequency 1x / week    PT Duration 6 weeks    PT Treatment/Interventions ADLs/Self Care Home Management;Therapeutic exercise;Neuromuscular re-education;Patient/family education;Manual lymph drainage;Manual techniques;Passive range of motion    PT Next Visit Plan Consider DC,continue shoulder strength/stability, AAROM, AROM .  Will continue 1x/week    PT Home Exercise Plan [id number]    Consulted and Agree with Plan of Care Patient           Patient will benefit from skilled therapeutic intervention in order to improve the following deficits and impairments:  Decreased endurance, Decreased knowledge of precautions, Decreased activity tolerance, Impaired UE functional use, Postural dysfunction, Pain, Increased edema, Decreased scar mobility, Decreased range of motion  Visit Diagnosis: Aftercare following surgery for neoplasm  Stiffness of left shoulder, not elsewhere classified  Stiffness of right shoulder, not elsewhere classified  Muscle weakness (generalized)  Abnormal posture     Problem List Patient Active Problem List   Diagnosis  Date Noted  . Port-A-Cath in place 11/26/2018  . Recurrent cancer of left breast ([place]) 11/13/2018  . Neuropathy due to chemotherapeutic drug ([place]) 11/13/2018  . Chest tightness   . Vertigo 05/28/2018  . Prediabetes 05/28/2018  . OSA  (obstructive sleep apnea) 08/20/2016  . Snoring 08/20/2016  . Adjustment disorder with mixed anxiety and depressed mood 08/20/2016  . Genetic testing 07/07/2015  . Family history of uterine cancer   . Family history of bladder cancer   . Abnormal AST and ALT 07/08/2014  . Fatty infiltration of liver 07/08/2014  . RUQ pain 06/27/2014  . Nonspecific (abnormal) findings on radiological and other examination of gastrointestinal tract 06/27/2014  . Fever 06/15/2014  . Diarrhea 06/10/2014  . Chemotherapy-induced nausea 06/10/2014  . Abdominal pain 06/10/2014  . Drug induced neutropenia(288.03) 04/21/2014  . Malignant neoplasm of upper-inner quadrant of left breast in female, estrogen receptor negative ([place]) 02/11/2014  . OTHER CHEST PAIN 05/21/2010  . HYPERLIPIDEMIA 05/11/2010  . PSORIASIS 05/11/2010  . PALPITATIONS 05/11/2010  . Nonspecific (abnormal) findings on radiological and other examination of body structure 05/11/2010  . CHEST XRAY, ABNORMAL 05/11/2010  . Hypothyroidism 05/10/2010  . EPIGASTRIC PAIN 05/10/2010  . CONJUNCTIVITIS, ALLERGIC 02/18/2008  . MALAISE AND FATIGUE 01/11/2008  . Essential hypertension 09/02/2007  . SINUSITIS- ACUTE-NOS 09/02/2007  . Acute upper respiratory infection 09/02/2007  . Allergic rhinitis, cause unspecified 09/02/2007    Elsie Ra [facility] 09/05/2020, 10:56 AM  [place], [place], [address] Phone: [phone]   Fax:  [phone]  Name: TATANISHA CUTHBERT MRN: [id number] Date of Birth: Jun 30, 1961  Cheral Almas, PT 09/05/20 10:58 AM

## 2020-09-13 ENCOUNTER — Ambulatory Visit: Payer: No Typology Code available for payment source

## 2020-09-13 ENCOUNTER — Other Ambulatory Visit: Payer: Self-pay

## 2020-09-13 DIAGNOSIS — Z483 Aftercare following surgery for neoplasm: Secondary | ICD-10-CM

## 2020-09-13 DIAGNOSIS — M25611 Stiffness of right shoulder, not elsewhere classified: Secondary | ICD-10-CM

## 2020-09-13 DIAGNOSIS — M6281 Muscle weakness (generalized): Secondary | ICD-10-CM

## 2020-09-13 DIAGNOSIS — M25612 Stiffness of left shoulder, not elsewhere classified: Secondary | ICD-10-CM

## 2020-09-13 DIAGNOSIS — R293 Abnormal posture: Secondary | ICD-10-CM

## 2020-09-13 NOTE — Therapy (Signed)
[place], [place], [address] Phone: [phone]   Fax:  [phone]  Physical Therapy Treatment  Patient Details  Name: Alexandria Bradley MRN: [id number] Date of Birth: 12-02-1960 Referring Provider (PT): Dr. Jana Hakim   Encounter Date: 09/13/2020   PT End of Session - 09/13/20 1717    Visit Number 8    Number of Visits 13    Date for PT Re-Evaluation 09/19/20    PT Start Time 1502    PT Stop Time 1551    PT Time Calculation (min) 49 min    Activity Tolerance Patient tolerated treatment well    Behavior During Therapy [facility] for tasks assessed/performed           Past Medical History:  Diagnosis Date  . Anxiety   . Arthritis   . Back pain   . Breast cancer ([place])    left  . Fatty liver   . GERD (gastroesophageal reflux disease)   . Headache(784.0)    PMH : migraines  . History of kidney stones   . Hypercholesterolemia   . Hypertension   . Hypothyroidism   . Osteoporosis   . Personal history of chemotherapy   . Personal history of radiation therapy   . PONV (postoperative nausea and vomiting)    difficult to wake up  . Pre-diabetes   . Psoriasis   . PVC's (premature ventricular contractions)   . Radiation 08/18/14-10/07/14   Left breast  . Sleep apnea    no CPAP    Past Surgical History:  Procedure Laterality Date  . BREAST BIOPSY Left 11/10/2018  . BREAST LUMPECTOMY Left 2015  . BREAST LUMPECTOMY WITH AXILLARY LYMPH NODE BIOPSY  6/15   left  . BREAST RECONSTRUCTION WITH PLACEMENT OF TISSUE EXPANDER AND ALLODERM Bilateral 04/12/2019   Procedure: BILATERAL BREAST RECONSTRUCTION WITH PLACEMENT OF TISSUE EXPANDERS; ALLODERM TO RIGHT CHEST;  Surgeon: Irene Limbo, MD;  Location: [place];  Service: Plastics;  Laterality: Bilateral;  . BREAST RECONSTRUCTION WITH PLACEMENT OF TISSUE EXPANDER AND ALLODERM Right 01/24/2020   Procedure: BREAST RECONSTRUCTION WITH PLACEMENT OF TISSUE  EXPANDER AND ALLODERM;  Surgeon: Irene Limbo, MD;  Location: [place];  Service: Plastics;  Laterality: Right;  . CARDIAC CATHETERIZATION    . CHOLECYSTECTOMY    . colon polyps    . COLONOSCOPY N/A 06/27/2014   Procedure: COLONOSCOPY;  Surgeon: Lear Ng, MD;  Location: [facility] ENDOSCOPY;  Service: Endoscopy;  Laterality: N/A;  . LATISSIMUS FLAP TO BREAST Left 04/12/2019   Procedure: LEFT LATISSIMUS DORSI FLAP TO LEFT CHEST;  Surgeon: Irene Limbo, MD;  Location: [place];  Service: Plastics;  Laterality: Left;  . LEFT HEART CATH AND CORONARY ANGIOGRAPHY N/A 07/24/2018   Procedure: LEFT HEART CATH AND CORONARY ANGIOGRAPHY;  Surgeon: Troy Sine, MD;  Location: [place] CV LAB;  Service: Cardiovascular;  Laterality: N/A;  . LIPOMA EXCISION    . LIPOSUCTION WITH LIPOFILLING Bilateral 06/12/2020   Procedure: LIPOFILLING TO BILATERAL CHEST;  Surgeon: Irene Limbo, MD;  Location: [place];  Service: Plastics;  Laterality: Bilateral;  . MASTECTOMY W/ SENTINEL NODE BIOPSY Bilateral 04/12/2019   Procedure: RIGHT BREAST RISK REDUCING MASTECTOMY; LEFT MASTECTOMY WITH LEFT AXILLARY SENTINEL LYMPH NODE BIOPSY WITH BLUE DYE INJECTION;  Surgeon: [name], MD;  Location: [place];  Service: General;  Laterality: Bilateral;  . MYOMECTOMY    . PORT-A-CATH REMOVAL N/A 02/03/2015   Procedure: REMOVAL PORT-A-CATH;  Surgeon: [name], MD;  Location: [place];  Service: General;  Laterality: N/A;  . PORT-A-CATH REMOVAL Right 01/24/2020   Procedure: REMOVAL PORT-A-CATH;  Surgeon: Irene Limbo, MD;  Location: [place];  Service: Plastics;  Laterality: Right;  . PORTACATH PLACEMENT N/A 03/01/2014   Procedure: INSERTION PORT-A-CATH;  Surgeon: [name], MD;  Location: [place];  Service: General;  Laterality: N/A;  . PORTACATH PLACEMENT N/A 11/18/2018   Procedure: INSERTION PORT-A-CATH WITH  ULTRASOUND;  Surgeon: [name], MD;  Location: [place];  Service: General;  Laterality: N/A;  . REMOVAL OF BILATERAL TISSUE EXPANDERS WITH PLACEMENT OF BILATERAL BREAST IMPLANTS Bilateral 06/12/2020   Procedure: REMOVAL OF BILATERAL TISSUE EXPANDERS WITH PLACEMENT OF BILATERAL BREAST IMPLANTS;  Surgeon: Irene Limbo, MD;  Location: [place];  Service: Plastics;  Laterality: Bilateral;  . REMOVAL OF TISSUE EXPANDER AND PLACEMENT OF IMPLANT Right 06/02/2019   Procedure: REMOVAL OF TISSUE EXPANDER RIGHT CHEST;  Surgeon: Irene Limbo, MD;  Location: [place];  Service: Plastics;  Laterality: Right;  . TONSILLECTOMY    . TUBAL LIGATION    . WOUND DEBRIDEMENT Left 06/02/2019   Procedure: LEFT CHEST DEBRIDEMENT;  Surgeon: Irene Limbo, MD;  Location: [place];  Service: Plastics;  Laterality: Left;    There were no vitals filed for this visit.   Subjective Assessment - 09/13/20 1505    Subjective I got some pain in the center right of my chest on Sat and I think I pulled a muscle.  At night rolling over in the bed it was painful. It was feeling better and then yesterday I tried to do my exercises but I felt like I was aggravating it. It hasn't really bothered me today. Still have a little swelling on the left side of chest, under arm, and along my collar bone.    Pertinent History bilateral mastectomies with left lat flap on 04/12/19 with left SLNB and 2 Neg. nodes.  Most recently had surgery 06/11/20 to remove expanders and place implants.  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 She has in the past had injections in her shoulder.    Patient Stated Goals Get some upper body strength back.  Reaching is difficult for her    Currently in Pain? No/denies    Pain Score 0-No pain    Pain Frequency Intermittent                             [facility] Adult PT  Treatment/Exercise - 09/13/20 0001      Manual Therapy   Manual Lymphatic Drainage (MLD) short neck, left inguinal LN, axillo inguinal pathway and left lateral trunkm inferior clavicular area and inferior axillary region retracing steps of axillio inguinal pathway.    Passive ROM Bilateral shoulders flex, abd, ER, IR.  Multiple VC's required to get pt to relax                  PT Education - 09/13/20 1716    Education Details Pt advised to hold off on exercises through the weekend to allow chest to heal before starting back gently to exercises    Person(s) Educated Patient    Methods Explanation    Comprehension Verbalized understanding            PT Short Term Goals - 08/08/20 1203      PT SHORT TERM GOAL #  1   Title Pt will be independant in HEP for shoulder ROM and strengthening    Time 4    Status New    Target Date 09/05/20      PT SHORT TERM GOAL #2   Title Pt will have 120 degrees of painfree  shoulder flexion and abduction so that she can perform self care easier    Time 4    Period Weeks    Status New    Target Date 09/05/20             PT Long Term Goals - 08/08/20 1204      PT LONG TERM GOAL #1   Title Right shoulder abd atleast 130 degrees for ability to dress and reach properly    Baseline 89    Time 5    Period Weeks    Status New    Target Date 09/12/20      PT LONG TERM GOAL #2   Title Pt will have bilateral shoulder ROM WNL to resume usual home activities    Time 6    Period Weeks    Status New    Target Date 09/19/20      PT LONG TERM GOAL #3   Title Pt will verbalize lymphedema risk reduction practices    Time 6    Period Days    Status New    Target Date 09/19/20      PT LONG TERM GOAL #4   Title Pt will be independent in a HEP for strengthening so that she can return to normal activities    Time 6    Period Weeks    Status New      PT LONG TERM GOAL #5   Title Pt will decrease Quick DASH score to < 20 indicating an  improvement in functional use of UE    Baseline 50    Time 6    Period Weeks    Status New                 Plan - 09/13/20 1717    Clinical Impression Statement Held pt exercises and performance of self MLD today secondary to new pain in right center chest believed to be muscle strain that is improving.  She will hold exercises through the weekend and start back gently if feeling better. mild swelling noted at inferior clavicular area, and inferior axillary region today and was addressed with MLD.  Pts, bilateral shoulder ROM continues to be very good with PROM.    Personal Factors and Comorbidities Comorbidity 2    Comorbidities recurrent breast CA, Infection right breast requiring initial removal of expander, infection risk    Examination-Activity Limitations Dressing;Lift;Reach Overhead;Carry    Stability/Clinical Decision Making Stable/Uncomplicated    Clinical Decision Making Low    Rehab Potential Excellent    PT Frequency 1x / week    PT Duration 6 weeks    PT Treatment/Interventions ADLs/Self Care Home Management;Therapeutic exercise;Neuromuscular re-education;Patient/family education;Manual lymph drainage;Manual techniques;Passive range of motion    PT Next Visit Plan recert or DC, instruct Self MLD, check goals    Consulted and Agree with Plan of Care Patient           Patient will benefit from skilled therapeutic intervention in order to improve the following deficits and impairments:  Decreased endurance,Decreased knowledge of precautions,Decreased activity tolerance,Impaired UE functional use,Postural dysfunction,Pain,Increased edema,Decreased scar mobility,Decreased range of motion  Visit Diagnosis: Aftercare following surgery for neoplasm  Stiffness  of left shoulder, not elsewhere classified  Stiffness of right shoulder, not elsewhere classified  Muscle weakness (generalized)  Abnormal posture     Problem List Patient Active Problem List   Diagnosis  Date Noted  . Port-A-Cath in place 11/26/2018  . Recurrent cancer of left breast ([place]) 11/13/2018  . Neuropathy due to chemotherapeutic drug ([place]) 11/13/2018  . Chest tightness   . Vertigo 05/28/2018  . Prediabetes 05/28/2018  . OSA (obstructive sleep apnea) 08/20/2016  . Snoring 08/20/2016  . Adjustment disorder with mixed anxiety and depressed mood 08/20/2016  . Genetic testing 07/07/2015  . Family history of uterine cancer   . Family history of bladder cancer   . Abnormal AST and ALT 07/08/2014  . Fatty infiltration of liver 07/08/2014  . RUQ pain 06/27/2014  . Nonspecific (abnormal) findings on radiological and other examination of gastrointestinal tract 06/27/2014  . Fever 06/15/2014  . Diarrhea 06/10/2014  . Chemotherapy-induced nausea 06/10/2014  . Abdominal pain 06/10/2014  . Drug induced neutropenia(288.03) 04/21/2014  . Malignant neoplasm of upper-inner quadrant of left breast in female, estrogen receptor negative ([place]) 02/11/2014  . OTHER CHEST PAIN 05/21/2010  . HYPERLIPIDEMIA 05/11/2010  . PSORIASIS 05/11/2010  . PALPITATIONS 05/11/2010  . Nonspecific (abnormal) findings on radiological and other examination of body structure 05/11/2010  . CHEST XRAY, ABNORMAL 05/11/2010  . Hypothyroidism 05/10/2010  . EPIGASTRIC PAIN 05/10/2010  . CONJUNCTIVITIS, ALLERGIC 02/18/2008  . MALAISE AND FATIGUE 01/11/2008  . Essential hypertension 09/02/2007  . SINUSITIS- ACUTE-NOS 09/02/2007  . Acute upper respiratory infection 09/02/2007  . Allergic rhinitis, cause unspecified 09/02/2007    Claris Pong 09/13/2020, 5:22 PM  [place], [place], [address] Phone: [phone]   Fax:  [phone]  Name: GEANNA DIVIRGILIO MRN: [id number] Date of Birth: 01-Jan-1961

## 2020-09-18 IMAGING — MR MR THORACIC SPINE WO/W CM
5 of 10 series · 21 of 48 positions shown · IV contrast (gadavist)
Comparison: Bone scan 11/13/2018

CLINICAL DATA: Restaging breast ca. eval for possible uptake seen
on bone scan in pacs. Pt extremely claustrophobic.

EXAM:
MRI THORACIC WITHOUT AND WITH CONTRAST
TECHNIQUE: Multiplanar and multiecho pulse sequences of the thoracic spine were
obtained without and with intravenous contrast.
CONTRAST:  8 ml Gadavist

[Series 5: T1 · sagittal · 3.0mm · 0.62mm/px · 3 of 14 slices shown (1 of 2)]
[im 1/14]
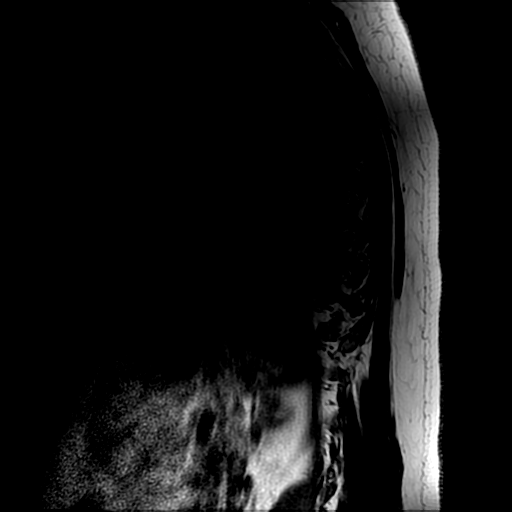
[im 7/14]
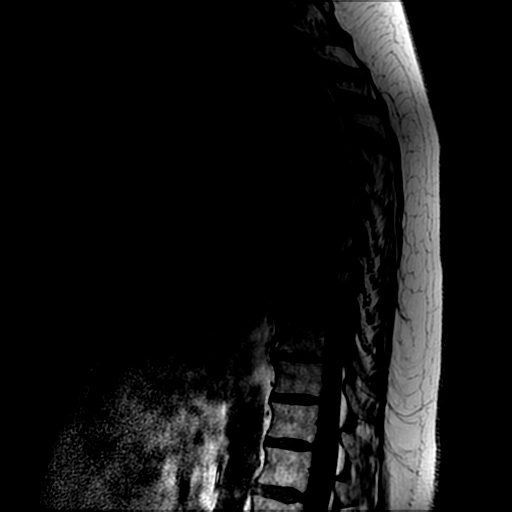
[im 14/14]
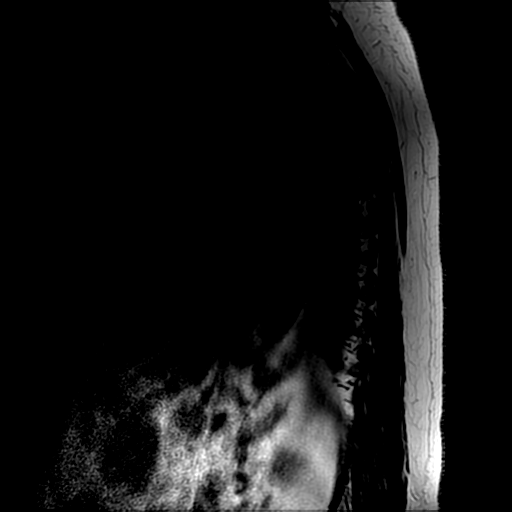

[Series 7: T2 post-contrast · sagittal · 3.0mm · 1.25mm/px · 3 of 14 slices shown (1 of 2)]
[im 1/14]
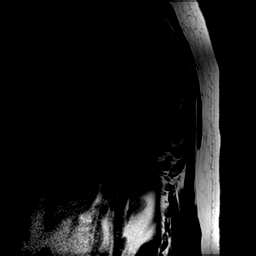
[im 7/14]
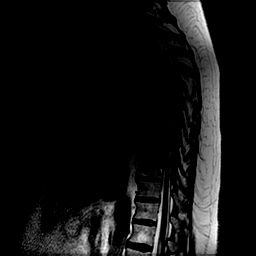
[im 14/14]
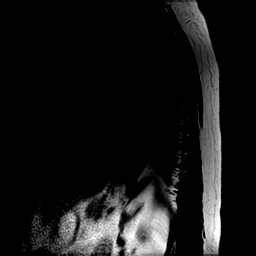

[Series 8: T2 · axial · 4.0mm · 0.39mm/px · z∈[-310,-90]mm · 8 of 39 slices shown]
[im 1/39]
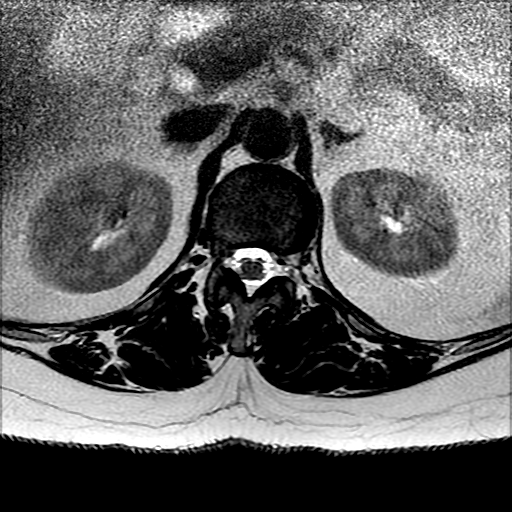
[im 6/39]
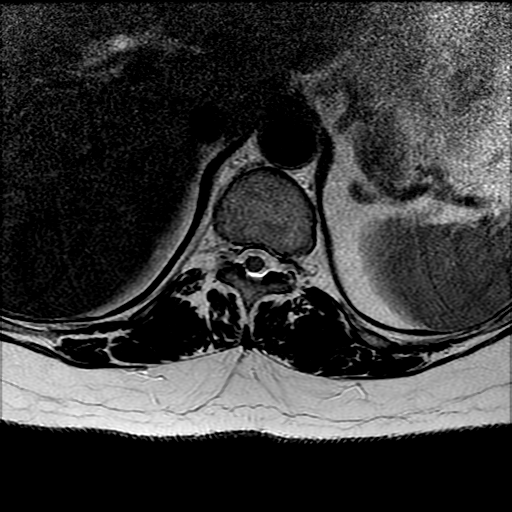
[im 11/39]
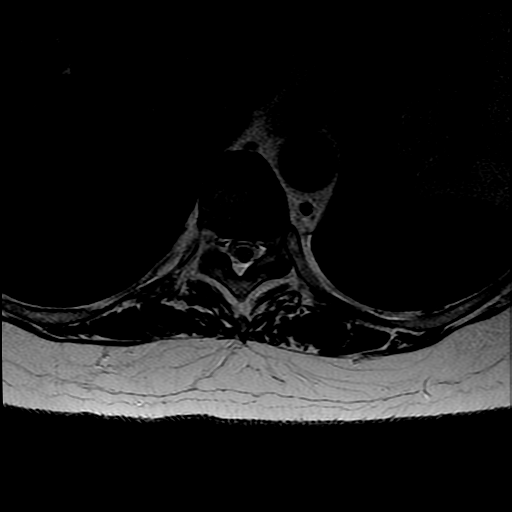
[im 17/39]
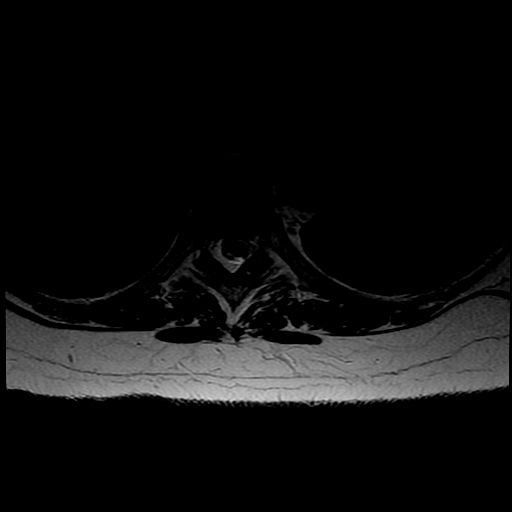
[im 22/39]
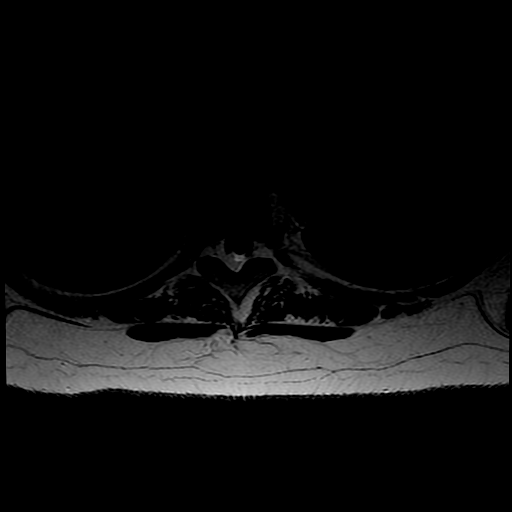
[im 28/39]
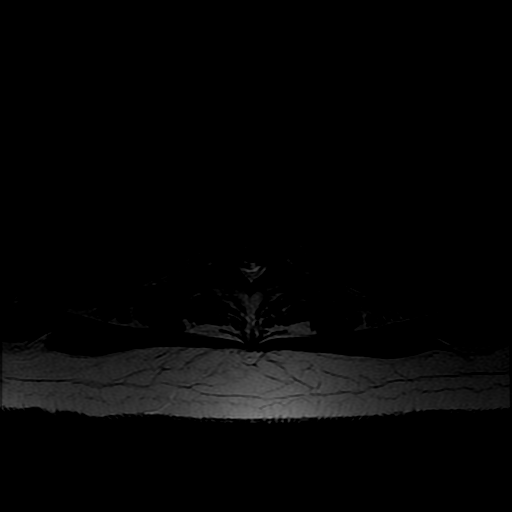
[im 33/39]
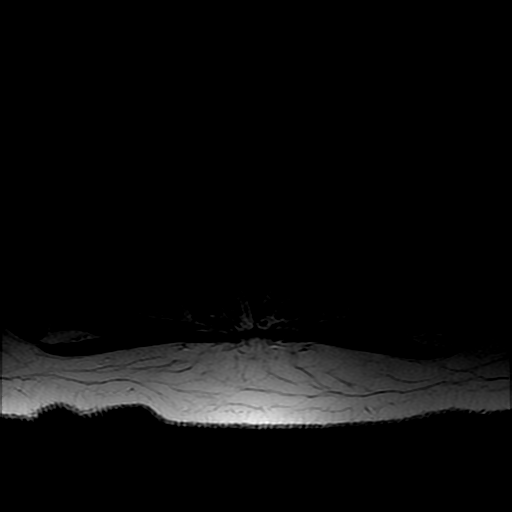
[im 39/39]
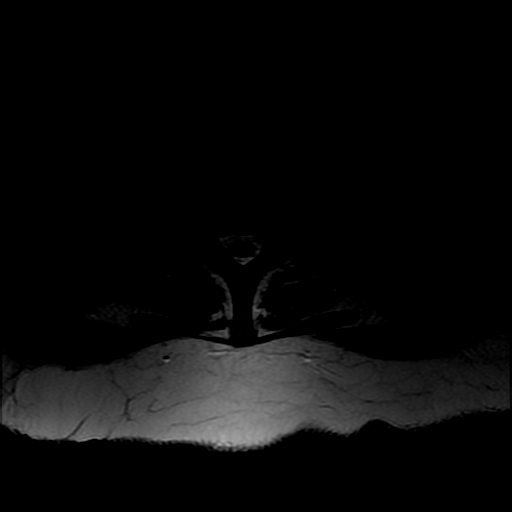

[Series 10: T1 · axial · non-contrast · 4.0mm · 0.39mm/px · z∈[-310,-185]mm · 4 of 39 slices shown (2 of 2)]
[im 1/39]
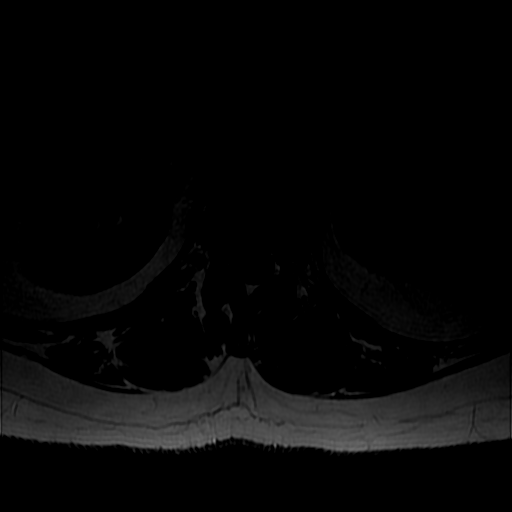
[im 6/39]
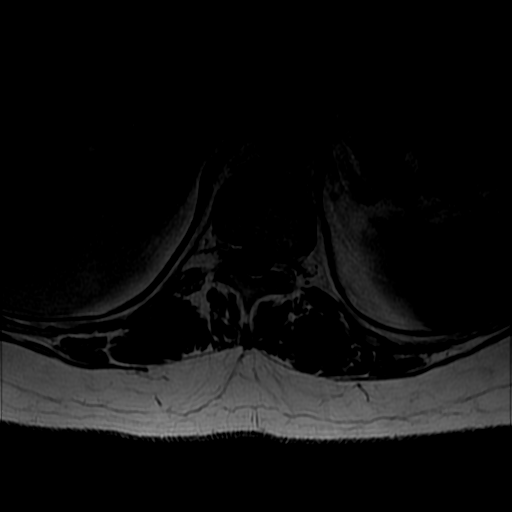
[im 11/39]
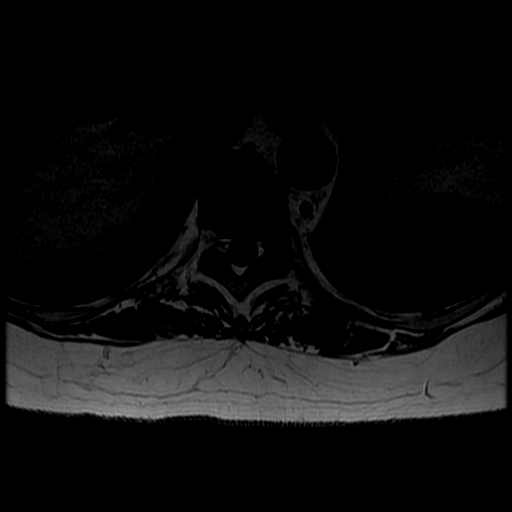
[im 17/39]
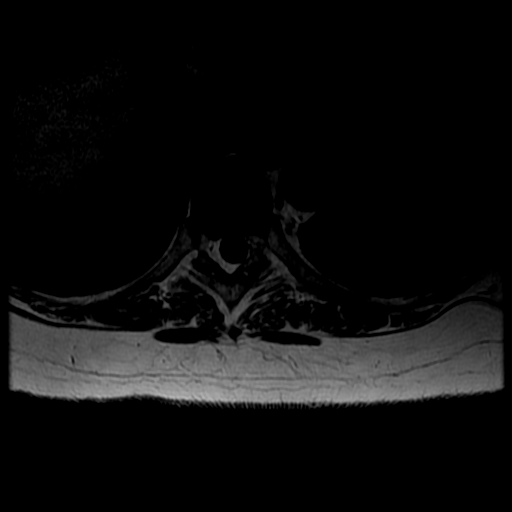

[Series 11: T2 post-contrast · sagittal · 3.0mm · 0.62mm/px · 3 of 14 slices shown (2 of 2)]
[im 1/14]
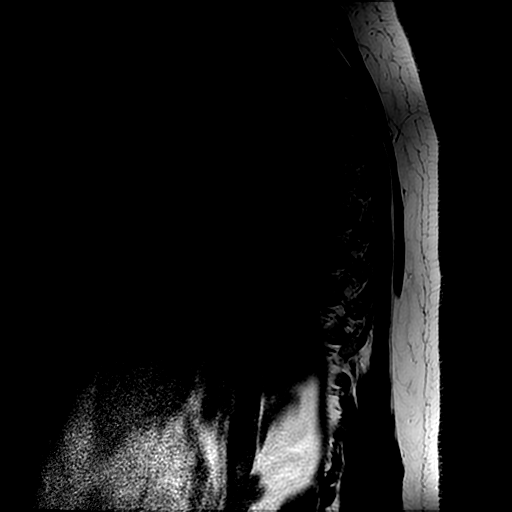
[im 7/14]
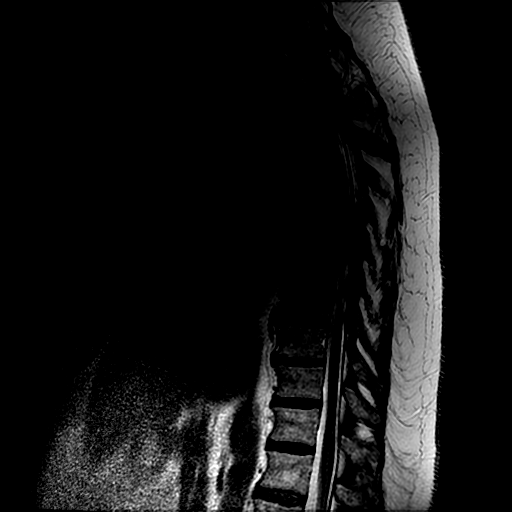
[im 14/14]
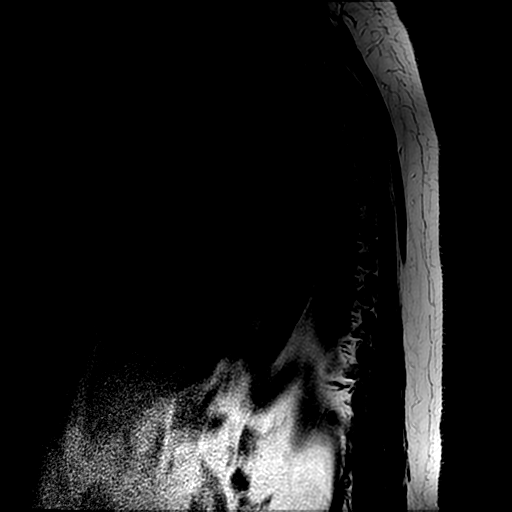

[21 of 48 positions shown; findings below may reference images not displayed]

FINDINGS: MRI THORACIC SPINE FINDINGS

Alignment:  Physiologic.

Vertebrae: No fracture, evidence of discitis, or bone lesion.

Cord:  Normal signal and morphology.

Paraspinal and other soft tissues: No acute paraspinal abnormality.

Disc levels:

Disc spaces: Degenerative disc disease with disc height loss at
T10-11 and T11-12.

T1-T2: No disc protrusion, foraminal stenosis or central canal
stenosis.

T2-T3: No disc protrusion, foraminal stenosis or central canal
stenosis.

T3-T4: No disc protrusion, foraminal stenosis or central canal
stenosis.

T4-T5: No disc protrusion, foraminal stenosis or central canal
stenosis.

T5-T6: No disc protrusion, foraminal stenosis or central canal
stenosis.

T6-T7: No disc protrusion, foraminal stenosis or central canal
stenosis.

T7-T8: No disc protrusion, foraminal stenosis or central canal
stenosis.

T8-T9: No disc protrusion, foraminal stenosis or central canal
stenosis.

T9-T10: No disc protrusion, foraminal stenosis or central canal
stenosis. Left facet arthropathy at T9-10.

T10-T11: No disc protrusion, foraminal stenosis or central canal
stenosis. Left facet arthropathy.

T11-T12: No disc protrusion, foraminal stenosis or central canal
stenosis.
IMPRESSION: 1. No aggressive osseous lesion of the thoracic spine to suggest
metastatic disease.

## 2020-10-19 ENCOUNTER — Other Ambulatory Visit: Payer: No Typology Code available for payment source

## 2020-10-20 ENCOUNTER — Other Ambulatory Visit: Payer: Self-pay

## 2020-10-20 ENCOUNTER — Encounter: Payer: Self-pay | Admitting: Family Medicine

## 2020-10-20 ENCOUNTER — Telehealth (INDEPENDENT_AMBULATORY_CARE_PROVIDER_SITE_OTHER): Payer: No Typology Code available for payment source | Admitting: Family Medicine

## 2020-10-20 VITALS — Temp 99.8°F

## 2020-10-20 DIAGNOSIS — J069 Acute upper respiratory infection, unspecified: Secondary | ICD-10-CM | POA: Diagnosis not present

## 2020-10-20 MED ORDER — METHYLPREDNISOLONE 4 MG PO TBPK: ORAL_TABLET | ORAL | 0 refills | Status: DC

## 2020-10-20 MED ORDER — BENZONATATE 100 MG PO CAPS: 100.0000 mg | ORAL_CAPSULE | Freq: Three times a day (TID) | ORAL | 0 refills | Status: DC | PRN

## 2020-10-20 NOTE — Progress Notes (Signed)
Chief Complaint  Patient presents with   Cough    Congestion    Sore Throat   Fever   Chills    Alexandria Bradley here for URI complaints. Due to COVID-19 pandemic, we are interacting via web portal for an electronic face-to-face visit. I verified patient's ID using 2 identifiers. Patient agreed to proceed with visit via this method. Patient is at home, I am at office. Patient and I are present for visit.   Duration: 2 days  Associated symptoms: Fever (100.8 F), sinus headache, sinus congestion, sinus pain, rhinorrhea, itchy watery eyes, ear fullness, ear pain, sore throat and cough, nausea Denies: ear drainage, wheezing, shortness of breath, myalgia and V/D Treatment to date: Benaryl, Tylenol Sick contacts: Yes - spouse tested + for covid She is vaccinated. Has not been tested herself.   Past Medical History:  Diagnosis Date   Anxiety    Arthritis    Back pain    Breast cancer ([place])    left   Fatty liver    GERD (gastroesophageal reflux disease)    Headache(784.0)    PMH : migraines   History of kidney stones    Hypercholesterolemia    Hypertension    Hypothyroidism    Osteoporosis    Personal history of chemotherapy    Personal history of radiation therapy    PONV (postoperative nausea and vomiting)    difficult to wake up   Pre-diabetes    Psoriasis    PVC's (premature ventricular contractions)    Radiation 08/18/14-10/07/14   Left breast   Sleep apnea    no CPAP   Objective Temp 99.8 F (37.7 C) (Oral)  No conversational dyspnea Age appropriate judgment and insight Nml affect and mood  Viral URI with cough - Plan: benzonatate (TESSALON) 100 MG capsule, methylPREDNISolone (MEDROL DOSEPAK) 4 MG TBPK tablet  I think she has covid given her spouse has it. Orders as above.  Continue to push fluids, practice good hand hygiene, cover mouth when coughing. F/u prn. If starting to experience irreplaceable fluid loss, shaking, or shortness of  breath, seek immediate care. Pt voiced understanding and agreement to the plan.  [place], DO 10/20/20 12:54 PM

## 2020-11-06 ENCOUNTER — Other Ambulatory Visit: Payer: Self-pay | Admitting: Cardiovascular Disease

## 2020-11-20 IMAGING — US ULTRASOUND LEFT BREAST LIMITED
1 series · 5 of 5 positions shown · non-contrast
Comparison: Previous exam(s).

CLINICAL DATA: Patient with history of left-sided breast cancer.
Evaluate for interval change in size on neoadjuvant chemotherapy.

EXAM:
ULTRASOUND OF THE LEFT BREAST

[Series 1: ultrasound left breast limited · 0.06mm/px · 5 of 5 slices shown]
[im 1/5]
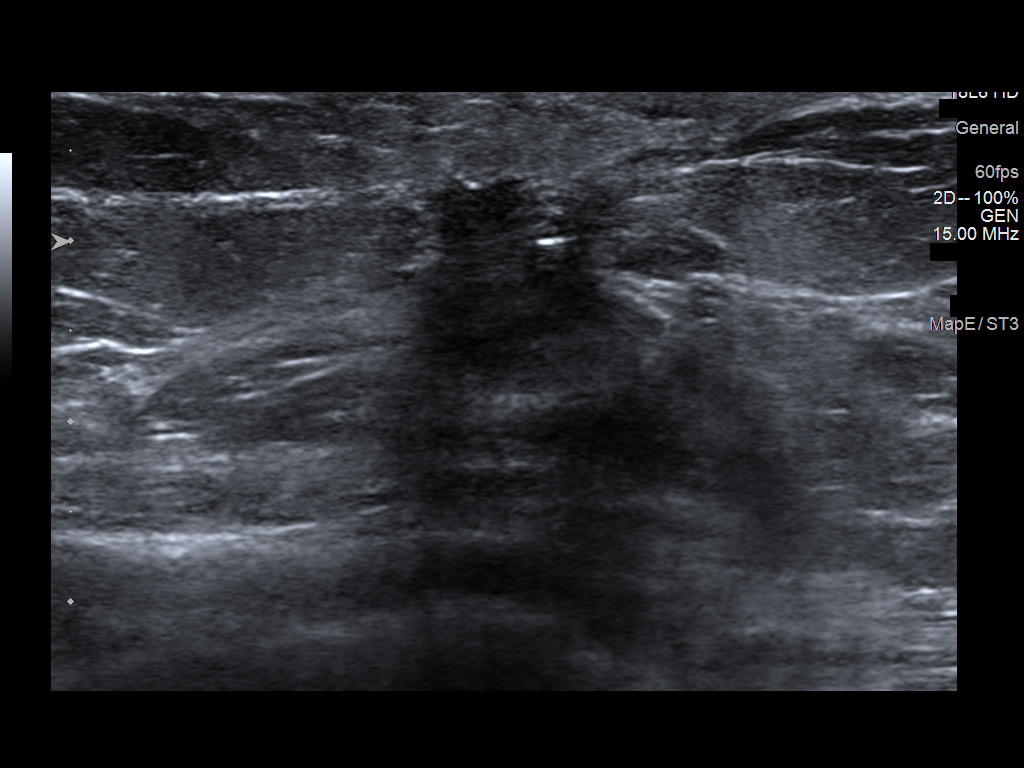
[im 2/5]
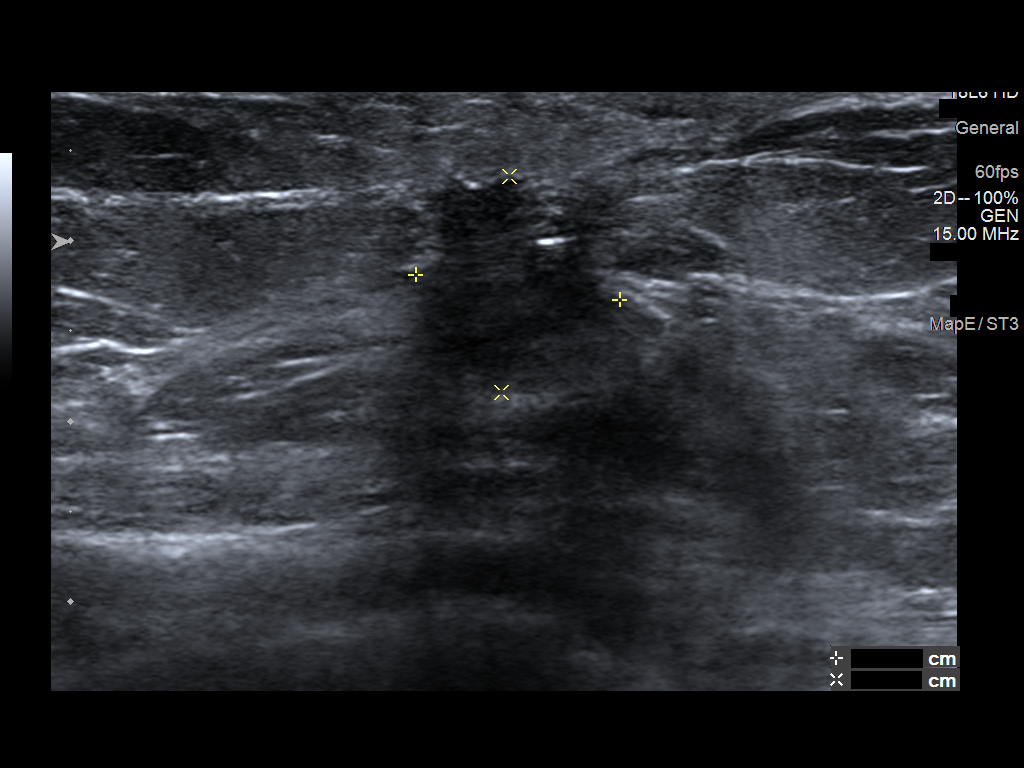
[im 3/5]
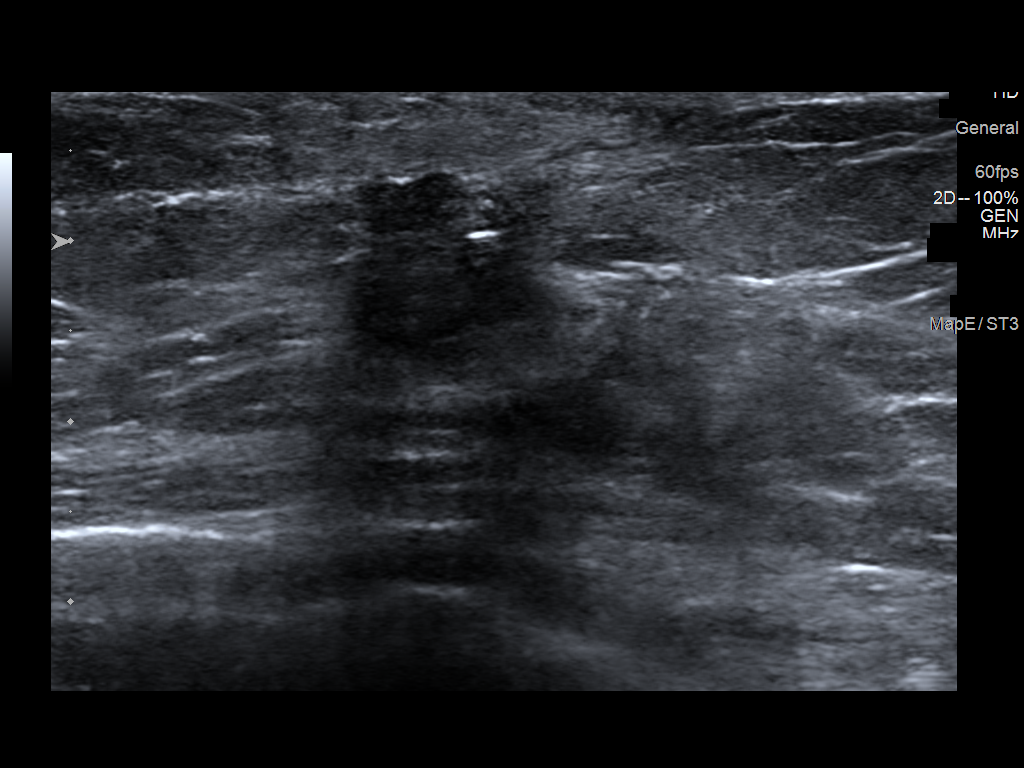
[im 4/5]
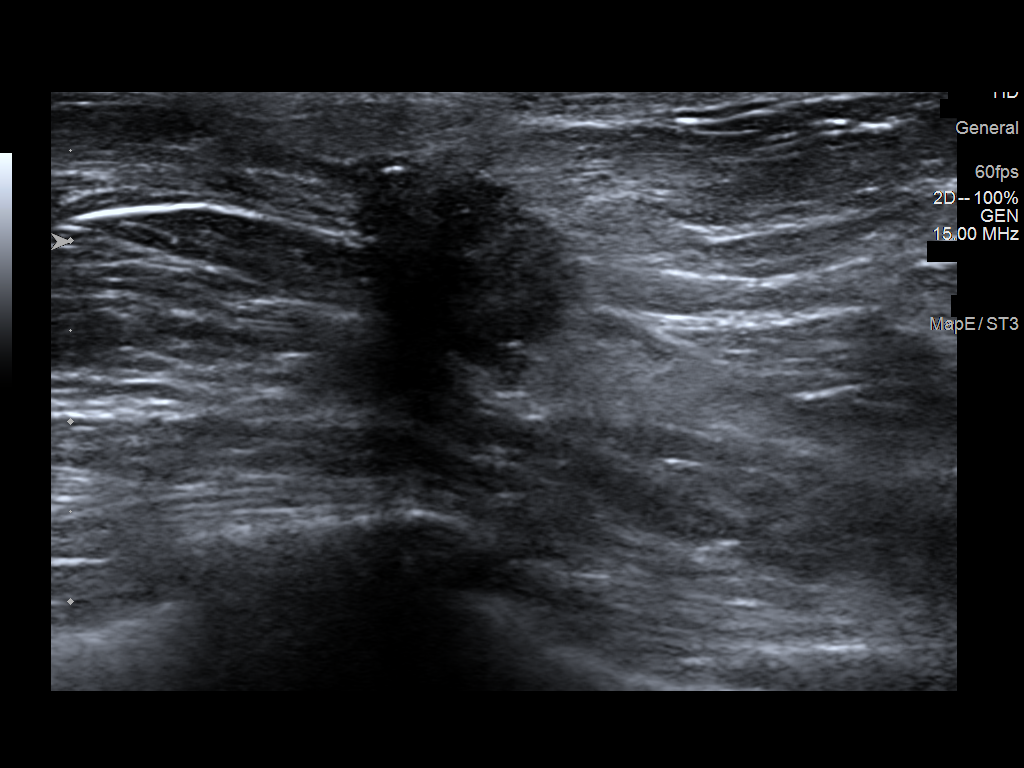
[im 5/5]
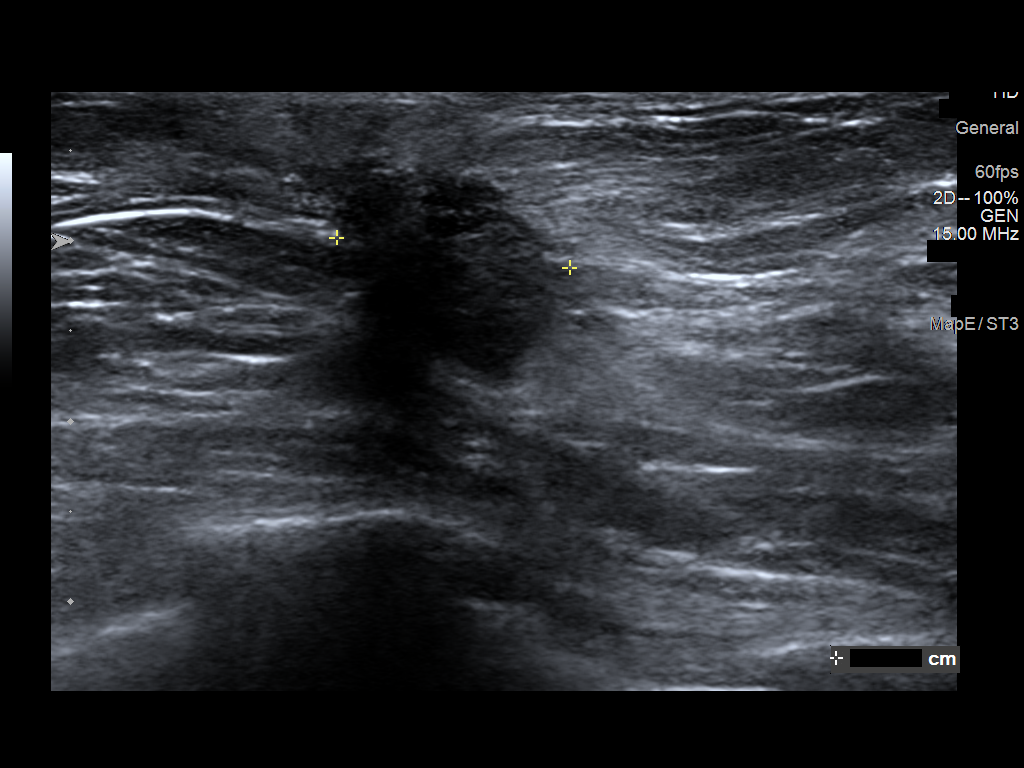

[5 of 5 positions shown; findings below may reference images not displayed]

FINDINGS: Targeted ultrasound is performed, showing a 1.1 x 1.2 x 1.3 cm
irregular hypoechoic mass compatible with biopsy-proven malignancy
left breast 10 o'clock position 7 cm from nipple. This mass is
similar in size when compared to prior exam where it measured 1.4 x
1.2 x 1.3 cm.
IMPRESSION: Grossly similar size of left breast malignancy when compared on
ultrasound.

RECOMMENDATION:
Continue with treatment plan for known left breast malignancy.

I have discussed the findings and recommendations with the patient.
Results were also provided in writing at the conclusion of the
visit. If applicable, a reminder letter will be sent to the patient
regarding the next appointment.

BI-RADS CATEGORY  6: Known biopsy-proven malignancy.

## 2020-11-29 ENCOUNTER — Other Ambulatory Visit: Payer: Self-pay | Admitting: Cardiovascular Disease

## 2020-12-26 ENCOUNTER — Ambulatory Visit (INDEPENDENT_AMBULATORY_CARE_PROVIDER_SITE_OTHER): Payer: No Typology Code available for payment source | Admitting: Physician Assistant

## 2020-12-26 ENCOUNTER — Other Ambulatory Visit: Payer: Self-pay

## 2020-12-26 ENCOUNTER — Encounter: Payer: Self-pay | Admitting: Physician Assistant

## 2020-12-26 VITALS — BP 128/72 | HR 83 | Ht 65.0 in | Wt 174.4 lb

## 2020-12-26 DIAGNOSIS — I10 Essential (primary) hypertension: Secondary | ICD-10-CM | POA: Diagnosis not present

## 2020-12-26 DIAGNOSIS — R002 Palpitations: Secondary | ICD-10-CM

## 2020-12-26 DIAGNOSIS — E782 Mixed hyperlipidemia: Secondary | ICD-10-CM

## 2020-12-26 MED ORDER — TRIAMTERENE-HCTZ 37.5-25 MG PO TABS: 1.0000 | ORAL_TABLET | ORAL | 0 refills | Status: DC | PRN

## 2020-12-26 NOTE — Progress Notes (Addendum)
Cardiology Office Note:    Date:  12/26/2020   ID:  Alexandria Bradley, DOB 08-24-1961, MRN [id number]  PCP:  [name], [facility]  Cardiologist:  Jenkins Rouge, MD   Electrophysiologist:  None       Referring MD: [name]*   Chief Complaint:  Follow-up (palpitations)    Patient Profile:    Alexandria Bradley is a 60 y.o. female with:   Palpitations  Cardiac monitor 10/19: NSR with PVCs  Intolerant of beta-blockers secondary to fatigue  Hypertension  Hyperlipidemia  Hypothyroidism  Cardiac catheterization 10/19-no CAD  L breast CA  Prior CV studies: Event monitor 07/28/2018 NSR Isolated PVCls  No significant NSVT Subjective feeling of skips and fluttering does usually  Correlate with PVCls   Echocardiogram 07/28/2018 EF 55-60, no RWMA, GR 1 DD, mild to moderately reduced RVSF  Cardiac catheterization 07/24/2018 Normal coronary arteries     History of Present Illness:    Alexandria Bradley was last seen by Dr. Johnsie Cancel in 2/21 via telemedicine.  [name]  She is here alone.  She had COVID back in January.  Afterwards, she started exercising.  She exercises every day.  She lifts some weights and walks on the treadmill for 30 minutes.  She also lost about 20 pounds.  Since that time, she has had issues with her blood pressure running in the 90s.  She has been symptomatic with this with lightheadedness and near syncope.  She did have some chest discomfort when she started walking on the treadmill after having Covid.  However, this is resolved over time and was likely related to her cough.  She has not had significant shortness of breath.  She has not had syncope, orthopnea or significant leg edema.         Past Medical History:  Diagnosis Date  . Anxiety   . Arthritis   . Back pain   . Breast cancer ([place])    left  . Fatty liver   . GERD (gastroesophageal reflux disease)   .  Headache(784.0)    PMH : migraines  . History of kidney stones   . Hypercholesterolemia   . Hypertension   . Hypothyroidism   . Osteoporosis   . Personal history of chemotherapy   . Personal history of radiation therapy   . PONV (postoperative nausea and vomiting)    difficult to wake up  . Pre-diabetes   . Psoriasis   . PVC's (premature ventricular contractions)   . Radiation 08/18/14-10/07/14   Left breast  . Sleep apnea    no CPAP    Current Medications: Current Meds  Medication Sig  . augmented betamethasone dipropionate (DIPROLENE-AF) 0.05 % cream Apply 1 application topically 2 (two) times daily as needed.  . diltiazem (CARDIZEM CD) 240 MG 24 hr capsule Take 1 capsule (240 mg total) by mouth daily.  Marland Kitchen esomeprazole (NEXIUM) 40 MG capsule Take 1 capsule (40 mg total) by mouth daily at 12 noon.  Marland Kitchen liothyronine (CYTOMEL) 5 MCG tablet Take 5 mcg by mouth daily before breakfast.   . Saccharomyces boulardii (PROBIOTIC) 250 MG CAPS Take 1 tablet by mouth every other day.  Marland Kitchen TIROSINT 137 MCG CAPS Take 1 capsule by mouth every morning.  . triamterene-hydrochlorothiazide (MAXZIDE-25) 37.5-25 MG tablet Take 1 tablet by mouth as needed.  . [DISCONTINUED] triamterene-hydrochlorothiazide (MAXZIDE-25) 37.5-25 MG tablet TAKE 1 TABLET BY MOUTH DAILY. PLEASE MAKE OVERDUE APPT WITH DR. Johnsie Cancel BEFORE  ANYMORE REFILLS. THANK YOU 1ST ATTEMPT     Allergies:   Chlorhexidine, Ciprofloxacin, Prednisone, Codeine, and Cortisone   Social History   Tobacco Use  . Smoking status: Former Smoker    Packs/day: 0.50    Years: 15.00    Pack years: 7.50    Types: Cigarettes  . Smokeless tobacco: Never Used  . Tobacco comment: Quit smoking cigarettes in 2002  Vaping Use  . Vaping Use: Never used  Substance Use Topics  . Alcohol use: Not Currently  . Drug use: No     Family Hx: The patient's family history includes Atrial fibrillation in her maternal aunt, maternal uncle, and mother; Bladder Cancer  in her maternal uncle; Brain cancer in her father; Colon polyps in her cousin; Endometrial cancer (age of onset: 13) in her mother; Hearing loss in her mother; Heart disease in her maternal aunt; Lung cancer in her father and maternal uncle; Multiple myeloma in her maternal uncle; Other in her son; Stroke in her maternal grandfather and maternal grandmother. There is no history of Colon cancer, Esophageal cancer, Rectal cancer, or Stomach cancer.  ROS   EKGs/Labs/Other Test Reviewed:    EKG:  EKG is   ordered today.  The ekg ordered today demonstrates normal sinus rhythm, heart rate 83, left axis deviation, septal Q waves, no ST-T wave changes, QTC 472, similar to prior tracings  Recent Labs: 08/04/2020: ALT 57; BUN 11; Creatinine, Ser 0.79; Hemoglobin 13.2; Platelets 294; Potassium 3.8; Sodium 139; TSH 0.982   Recent Lipid Panel Lab Results  Component Value Date/Time   CHOL 243 (HH) 01/12/2008 08:15 AM   TRIG 198 (H) 01/12/2008 08:15 AM   TRIG 209 (HH) 09/22/2006 10:56 AM   HDL 38.4 (L) 01/12/2008 08:15 AM   CHOLHDL 6.3 CALC 01/12/2008 08:15 AM   LDLDIRECT 175.2 01/12/2008 08:15 AM      Risk Assessment/Calculations:      Physical Exam:    VS:  BP 128/72   Pulse 83   Ht 5' 5"  (1.651 m)   Wt 174 lb 6.4 oz (79.1 kg)   SpO2 97%   BMI 29.02 kg/m     Wt Readings from Last 3 Encounters:  12/26/20 174 lb 6.4 oz (79.1 kg)  08/04/20 196 lb 4.8 oz (89 kg)  06/12/20 195 lb 5.2 oz (88.6 kg)     Constitutional:      Appearance: Healthy appearance. Not in distress.  Neck:     Vascular: No JVR. JVD normal.  Pulmonary:     Effort: Pulmonary effort is normal.     Breath sounds: No wheezing. No rales.  Cardiovascular:     Normal rate. Regular rhythm. Normal S1. Normal S2.     Murmurs: There is no murmur.  Edema:    Peripheral edema absent.  Abdominal:     Palpations: Abdomen is soft. There is no hepatomegaly.  Skin:    General: Skin is warm and dry.  Neurological:     Mental  Status: Alert and oriented to person, place and time.     Cranial Nerves: Cranial nerves are intact.          ASSESSMENT & PLAN:    1. Palpitations For the most part, her palpitations are controlled.  She still has times when she has palpitations and times when she does not.  She has not had any significant worsening.  Continue current dose of diltiazem.  2. Essential hypertension Blood pressure has been running lower since she started exercising and  lost weight.  She does use Maxide to help with swelling.  I have asked her to change Maxide to as needed dosing for swelling only.  If she has issues with her blood pressure running too high or if she cannot control her swelling with this, she knows to contact us.  3. Mixed hyperlipidemia She has not had follow-up on her lipids in many years.  I will arrange a follow-up fasting CMET, lipids.     Dispo:  Return in about 1 year (around 12/26/2021) for Routine Follow Up w Dr. Johnsie Cancel, or Richardson Dopp, PA-C, in person.   Medication Adjustments/Labs and Tests Ordered: Current medicines are reviewed at length with the patient today.  Concerns regarding medicines are outlined above.  Tests Ordered: Orders Placed This Encounter  Procedures  . Comp Met (CMET)  . Lipid panel  . EKG 12-Lead   Medication Changes: Meds ordered this encounter  Medications  . triamterene-hydrochlorothiazide (MAXZIDE-25) 37.5-25 MG tablet    Sig: Take 1 tablet by mouth as needed.    Dispense:  90 tablet    Refill:  0    Signed, Richardson Dopp, PA-C  12/26/2020 3:49 PM    [facility] [address] [place], [place]  [address] Phone: [phone]; Fax: [phone]

## 2020-12-26 NOTE — Patient Instructions (Addendum)
Medication Instructions:  Change your Maxzide to taking only as needed for swelling.  If your blood pressure does the same, let us know.  *If you need a refill on your cardiac medications before your next appointment, please call your pharmacy*   Lab Work: 01/09/2021:  COME BACK TO THE OFFICE, ANYTIME AFTER 7:30 A.M, FOR FASTING:  LIPID & CMET  If you have labs (blood work) drawn today and your tests are completely normal, you will receive your results only by: Marland Kitchen MyChart Message (if you have MyChart) OR . A paper copy in the mail If you have any lab test that is abnormal or we need to change your treatment, we will call you to review the results.   Testing/Procedures: None ordered   Follow-Up: At [facility], you and your health needs are our priority.  As part of our continuing mission to provide you with exceptional heart care, we have created designated Provider Care Teams.  These Care Teams include your primary Cardiologist (physician) and Advanced Practice Providers (APPs -  Physician Assistants and Nurse Practitioners) who all work together to provide you with the care you need, when you need it.  We recommend signing up for the patient portal called "MyChart".  Sign up information is provided on this After Visit Summary.  MyChart is used to connect with patients for Virtual Visits (Telemedicine).  Patients are able to view lab/test results, encounter notes, upcoming appointments, etc.  Non-urgent messages can be sent to your provider as well.   To learn more about what you can do with MyChart, go to [email or web address].    Your next appointment:   12 month(s)  The format for your next appointment:   In Person  Provider:   You may see Jenkins Rouge, MD or one of the following Advanced Practice Providers on your designated Care Team:    Richardson Dopp, [place]    Other Instructions

## 2021-01-09 ENCOUNTER — Other Ambulatory Visit: Payer: No Typology Code available for payment source

## 2021-01-09 ENCOUNTER — Other Ambulatory Visit: Payer: Self-pay

## 2021-01-09 DIAGNOSIS — I10 Essential (primary) hypertension: Secondary | ICD-10-CM

## 2021-01-09 DIAGNOSIS — E782 Mixed hyperlipidemia: Secondary | ICD-10-CM

## 2021-01-09 DIAGNOSIS — R002 Palpitations: Secondary | ICD-10-CM

## 2021-01-09 LAB — COMPREHENSIVE METABOLIC PANEL
ALT: 30 IU/L (ref 0–32)
AST: 28 IU/L (ref 0–40)
Albumin/Globulin Ratio: 1.8 (ref 1.2–2.2)
Albumin: 4.9 g/dL — ABNORMAL HIGH (ref 3.8–4.8)
Alkaline Phosphatase: 100 IU/L (ref 44–121)
BUN/Creatinine Ratio: 15 (ref 12–28)
BUN: 13 mg/dL (ref 8–27)
Bilirubin Total: 0.4 mg/dL (ref 0.0–1.2)
CO2: 23 mmol/L (ref 20–29)
Calcium: 10.4 mg/dL — ABNORMAL HIGH (ref 8.7–10.3)
Chloride: 98 mmol/L (ref 96–106)
Creatinine, Ser: 0.85 mg/dL (ref 0.57–1.00)
Globulin, Total: 2.8 g/dL (ref 1.5–4.5)
Glucose: 105 mg/dL — ABNORMAL HIGH (ref 65–99)
Potassium: 4.5 mmol/L (ref 3.5–5.2)
Sodium: 138 mmol/L (ref 134–144)
Total Protein: 7.7 g/dL (ref 6.0–8.5)
eGFR: 77 mL/min/{1.73_m2} (ref >59)

## 2021-01-09 LAB — LIPID PANEL
Chol/HDL Ratio: 6.4 ratio — ABNORMAL HIGH (ref 0.0–4.4)
Cholesterol, Total: 229 mg/dL — ABNORMAL HIGH (ref 100–199)
HDL: 36 mg/dL — ABNORMAL LOW (ref >39)
LDL Chol Calc (NIH): 167 mg/dL — ABNORMAL HIGH (ref 0–99)
Triglycerides: 139 mg/dL (ref 0–149)
VLDL Cholesterol Cal: 26 mg/dL (ref 5–40)

## 2021-01-18 ENCOUNTER — Telehealth: Payer: Self-pay | Admitting: Family Medicine

## 2021-01-18 NOTE — Telephone Encounter (Signed)
Sooner please. Not urgent or emergent, sched at her convenience. Ty.

## 2021-01-18 NOTE — Telephone Encounter (Signed)
Patients states after her appt with her Cardiologist he told her to follow up with her pcp due to her abnormal labs. She states she will have same labs drawn by Oncologist on  May 5th and wants to know if she should wait to see you or go ahead and get scheduled before she see the Oncologist. Please advise

## 2021-01-19 NOTE — Telephone Encounter (Signed)
Appt made

## 2021-01-24 ENCOUNTER — Telehealth (INDEPENDENT_AMBULATORY_CARE_PROVIDER_SITE_OTHER): Payer: No Typology Code available for payment source | Admitting: Family Medicine

## 2021-01-24 ENCOUNTER — Encounter: Payer: Self-pay | Admitting: Family Medicine

## 2021-01-24 VITALS — Temp 100.4°F

## 2021-01-24 DIAGNOSIS — R6889 Other general symptoms and signs: Secondary | ICD-10-CM | POA: Diagnosis not present

## 2021-01-24 MED ORDER — OSELTAMIVIR PHOSPHATE 75 MG PO CAPS: 75.0000 mg | ORAL_CAPSULE | Freq: Two times a day (BID) | ORAL | 0 refills | Status: AC

## 2021-01-24 NOTE — Progress Notes (Signed)
Chief Complaint  Patient presents with  . Sore Throat    Fever Body aches Left ear pain/pressure     Alexandria Bradley here for URI complaints. Due to COVID-19 pandemic, we are interacting via web portal for an electronic face-to-face visit. I verified patient's ID using 2 identifiers. Patient agreed to proceed with visit via this method. Patient is at home, I am at office. Patient and I are present for visit.   Duration: 1 day  Associated symptoms: sinus congestion, ear fullness, myalgia and fevers (38 C) Denies: sinus pain, rhinorrhea, itchy watery eyes, ear pain, ear drainage, sore throat, wheezing, shortness of breath and cough Treatment to date: Tylenol Sick contacts: Yes, saw grandson who was sick prior to her illness Took home covid test that was neg.    Past Medical History:  Diagnosis Date  . Anxiety   . Arthritis   . Back pain   . Breast cancer ([place])    left  . Fatty liver   . GERD (gastroesophageal reflux disease)   . Headache(784.0)    PMH : migraines  . History of kidney stones   . Hypercholesterolemia   . Hypertension   . Hypothyroidism   . Osteoporosis   . Personal history of chemotherapy   . Personal history of radiation therapy   . PONV (postoperative nausea and vomiting)    difficult to wake up  . Pre-diabetes   . Psoriasis   . PVC's (premature ventricular contractions)   . Radiation 08/18/14-10/07/14   Left breast  . Sleep apnea    no CPAP    Temp (!) 100.4 F (38 C) (Oral)  No conversational dyspnea Age appropriate judgment and insight Nml affect and mood  Flu-like symptoms - Plan: oseltamivir (TAMIFLU) 75 MG capsule  The flu has been going around in our community.  We will do 5 days of Tamiflu.  Continue Tylenol.  She needs to increase her Rhinocort to 2 sprays daily rather than 1 spray daily.  This will help with her ear. Continue to push fluids, practice good hand hygiene, cover mouth when coughing. F/u prn. If starting to experience  fevers, shaking, or shortness of breath, seek immediate care. Pt voiced understanding and agreement to the plan.  [place], DO 01/24/21 9:58 AM

## 2021-01-25 ENCOUNTER — Ambulatory Visit: Payer: No Typology Code available for payment source

## 2021-01-29 ENCOUNTER — Encounter: Payer: Self-pay | Admitting: Family Medicine

## 2021-01-29 ENCOUNTER — Telehealth: Payer: Self-pay | Admitting: Family Medicine

## 2021-01-29 ENCOUNTER — Other Ambulatory Visit: Payer: Self-pay

## 2021-01-29 ENCOUNTER — Telehealth (INDEPENDENT_AMBULATORY_CARE_PROVIDER_SITE_OTHER): Payer: No Typology Code available for payment source | Admitting: Family Medicine

## 2021-01-29 VITALS — BP 103/65 | Temp 96.4°F

## 2021-01-29 DIAGNOSIS — Z20818 Contact with and (suspected) exposure to other bacterial communicable diseases: Secondary | ICD-10-CM | POA: Diagnosis not present

## 2021-01-29 DIAGNOSIS — J029 Acute pharyngitis, unspecified: Secondary | ICD-10-CM

## 2021-01-29 MED ORDER — AMOXICILLIN 500 MG PO CAPS: 1000.0000 mg | ORAL_CAPSULE | Freq: Every day | ORAL | 0 refills | Status: AC

## 2021-01-29 NOTE — Telephone Encounter (Signed)
Is scheduled today.

## 2021-01-29 NOTE — Telephone Encounter (Signed)
Please schedule a Video Visit.

## 2021-01-29 NOTE — Telephone Encounter (Signed)
Pt,called and states she has strep throat chills and sweat she would like to know if a prescription can be sent in or can she make a schedule for an office visit

## 2021-01-29 NOTE — Progress Notes (Signed)
Chief Complaint  Patient presents with  . Sore Throat    Swollen lymph nodes Nasal congestion  . Headache    Alexandria Bradley here for URI complaints. Due to COVID-19 pandemic, we are interacting via web portal for an electronic face-to-face visit. I verified patient's ID using 2 identifiers. Patient agreed to proceed with visit via this method. Patient is at home, I am at office. Patient and I are present for visit.   Duration: several days  Associated symptoms: sinus congestion, rhinorrhea, sore throat, drainage, ear fullness on L, and tender LN's in neck Denies: itchy watery eyes, ear pain, ear drainage, wheezing, shortness of breath, myalgia and fevers Treatment to date: Tamiflu, Tylenol Sick contacts: Yes, grandchild and sister w strep  Past Medical History:  Diagnosis Date  . Anxiety   . Arthritis   . Back pain   . Breast cancer ([place])    left  . Fatty liver   . GERD (gastroesophageal reflux disease)   . Headache(784.0)    PMH : migraines  . History of kidney stones   . Hypercholesterolemia   . Hypertension   . Hypothyroidism   . Osteoporosis   . Personal history of chemotherapy   . Personal history of radiation therapy   . PONV (postoperative nausea and vomiting)    difficult to wake up  . Pre-diabetes   . Psoriasis   . PVC's (premature ventricular contractions)   . Radiation 08/18/14-10/07/14   Left breast  . Sleep apnea    no CPAP    BP 103/65 (BP Location: Left Arm, Patient Position: Sitting, Cuff Size: Normal)   Temp (!) 96.4 F (35.8 C) (Oral)  No conversational dyspnea Age appropriate judgment and insight Nml affect and mood  Sore throat - Plan: amoxicillin (AMOXIL) 500 MG capsule  Exposure to strep throat - Plan: amoxicillin (AMOXIL) 500 MG capsule  1000 mg/d, 10 d course of amox. Throw out toothbrush after use.  Continue to push fluids, practice good hand hygiene, cover mouth when coughing. F/u prn. If starting to experience fevers, shaking, or  shortness of breath, seek immediate care. Pt voiced understanding and agreement to the plan.  [place], DO 01/29/21 4:16 PM

## 2021-02-01 ENCOUNTER — Other Ambulatory Visit: Payer: Self-pay | Admitting: Cardiovascular Disease

## 2021-02-01 ENCOUNTER — Encounter: Payer: Self-pay | Admitting: Adult Health

## 2021-02-01 ENCOUNTER — Inpatient Hospital Stay: Payer: No Typology Code available for payment source | Attending: Adult Health | Admitting: Adult Health

## 2021-02-01 ENCOUNTER — Other Ambulatory Visit: Payer: Self-pay

## 2021-02-01 ENCOUNTER — Inpatient Hospital Stay: Payer: No Typology Code available for payment source

## 2021-02-01 VITALS — BP 126/69 | HR 65 | Temp 97.7°F | Resp 18 | Ht 65.0 in | Wt 168.6 lb

## 2021-02-01 DIAGNOSIS — C50212 Malignant neoplasm of upper-inner quadrant of left female breast: Secondary | ICD-10-CM | POA: Diagnosis not present

## 2021-02-01 DIAGNOSIS — Z9013 Acquired absence of bilateral breasts and nipples: Secondary | ICD-10-CM | POA: Insufficient documentation

## 2021-02-01 DIAGNOSIS — Z171 Estrogen receptor negative status [ER-]: Secondary | ICD-10-CM | POA: Diagnosis not present

## 2021-02-01 DIAGNOSIS — Z923 Personal history of irradiation: Secondary | ICD-10-CM | POA: Diagnosis not present

## 2021-02-01 DIAGNOSIS — C50912 Malignant neoplasm of unspecified site of left female breast: Secondary | ICD-10-CM

## 2021-02-01 DIAGNOSIS — Z853 Personal history of malignant neoplasm of breast: Secondary | ICD-10-CM | POA: Diagnosis present

## 2021-02-01 DIAGNOSIS — Z9221 Personal history of antineoplastic chemotherapy: Secondary | ICD-10-CM | POA: Insufficient documentation

## 2021-02-01 LAB — CBC WITH DIFFERENTIAL/PLATELET
Abs Immature Granulocytes: 0.1 10*3/uL — ABNORMAL HIGH (ref 0.00–0.07)
Basophils Absolute: 0.1 10*3/uL (ref 0.0–0.1)
Basophils Relative: 1 %
Eosinophils Absolute: 0.4 10*3/uL (ref 0.0–0.5)
Eosinophils Relative: 4 %
HCT: 40.6 % (ref 36.0–46.0)
Hemoglobin: 13.8 g/dL (ref 12.0–15.0)
Immature Granulocytes: 1 %
Lymphocytes Relative: 25 %
Lymphs Abs: 2.2 10*3/uL (ref 0.7–4.0)
MCH: 31 pg (ref 26.0–34.0)
MCHC: 34 g/dL (ref 30.0–36.0)
MCV: 91.2 fL (ref 80.0–100.0)
Monocytes Absolute: 0.7 10*3/uL (ref 0.1–1.0)
Monocytes Relative: 7 %
Neutro Abs: 5.5 10*3/uL (ref 1.7–7.7)
Neutrophils Relative %: 62 %
Platelets: 286 10*3/uL (ref 150–400)
RBC: 4.45 MIL/uL (ref 3.87–5.11)
RDW: 12.5 % (ref 11.5–15.5)
WBC: 8.9 10*3/uL (ref 4.0–10.5)
nRBC: 0 % (ref 0.0–0.2)

## 2021-02-01 LAB — COMPREHENSIVE METABOLIC PANEL
ALT: 24 U/L (ref 0–44)
AST: 23 U/L (ref 15–41)
Albumin: 4.3 g/dL (ref 3.5–5.0)
Alkaline Phosphatase: 90 U/L (ref 38–126)
Anion gap: 10 (ref 5–15)
BUN: 15 mg/dL (ref 8–23)
CO2: 27 mmol/L (ref 22–32)
Calcium: 10.3 mg/dL (ref 8.9–10.3)
Chloride: 104 mmol/L (ref 98–111)
Creatinine, Ser: 0.75 mg/dL (ref 0.44–1.00)
GFR, Estimated: >60 mL/min (ref >60)
Glucose, Bld: 103 mg/dL — ABNORMAL HIGH (ref 70–99)
Potassium: 3.8 mmol/L (ref 3.5–5.1)
Sodium: 141 mmol/L (ref 135–145)
Total Bilirubin: 0.4 mg/dL (ref 0.3–1.2)
Total Protein: 7.9 g/dL (ref 6.5–8.1)

## 2021-02-01 LAB — TSH: TSH: 0.652 u[IU]/mL (ref 0.350–4.500)

## 2021-02-01 NOTE — Progress Notes (Addendum)
Alexandria Bradley  [phone] [phone]    ID: [name] OB: [birth year]  MR#: [id number]  YWV#:[id number]  Patient Care Team: [name], DO as PCP - General (Family Medicine) Josue Hector, MD as PCP - Cardiology (Cardiology) Magrinat, Virgie Dad, MD as Consulting Physician (Oncology) Brien Few, MD as Consulting Physician (Obstetrics and Gynecology) [name], MD as Consulting Physician (General Surgery) Delrae Rend, MD as Consulting Physician (Endocrinology) Dorna Leitz, MD as Consulting Physician (Orthopedic Surgery) Josue Hector, MD as Consulting Physician (Cardiology) Irene Limbo, MD as Consulting Physician (Plastic Surgery) Ladene Artist, MD as Consulting Physician (Gastroenterology) OTHER MD:    CHIEF COMPLAINT: Triple negative breast cancer, locally recurrent (s/p bilateral mastectomies)  CURRENT TREATMENT: Observation   INTERVAL HISTORY: Trinty returns today for follow-up of her locally recurrent triple negative breast cancer.   [name] is planning to have breast reconstruction, and was going to do this last month.  She postponed it to the fall, and has a f/u with Dr. Iran Planas in August to readdress.  REVIEW OF SYSTEMS: Alexandria Bradley is exercising.  She is dieting and has lost from 196 pounds to 168 pounds in the past 6 months.  She had microscopic colitis and was on steroids earlier this year, and that has since resolved.   She gets occasional bone pain in her femurs, which has been evaluated with plain films and negative.  It seems to happen with increased exercise or weather changes.  Otherwise, a detailed ROS was non contributory today.   BREAST CANCER HISTORY: From the original intake note of 02/16/2014:  [name] palpated a mass in her left breast early May and brought it immediately to her gynecologist attention. He set her up for bilateral diagnostic mammography and left ultrasonography  at [facility] 02/07/2014. Mammography showed a 1.3 cm oval mass with spiculated margins in the left breast at the 11:00 position. This was palpable. Ultrasound showed a 1.1 cm tall her than wide mass with a microlobulated margins. He was hypoechoic. There were no abnormalities noted in the left axilla.  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 Lyndon Code at conference 02/16/2014), triple negative, with an MIB-1 of 50%.  The patient's subsequent history is as detailed below   PAST MEDICAL HISTORY: Past Medical History:  Diagnosis Date  . Anxiety   . Arthritis   . Back pain   . Breast cancer ([place])    left  . Fatty liver   . GERD (gastroesophageal reflux disease)   . Headache(784.0)    PMH : migraines  . History of kidney stones   . Hypercholesterolemia   . Hypertension   . Hypothyroidism   . Osteoporosis   . Personal history of chemotherapy   . Personal history of radiation therapy   . PONV (postoperative nausea and vomiting)    difficult to wake up  . Pre-diabetes   . Psoriasis   . PVC's (premature ventricular contractions)   . Radiation 08/18/14-10/07/14   Left breast  . Sleep apnea    no CPAP    PAST SURGICAL HISTORY: Past Surgical History:  Procedure Laterality Date  . BREAST BIOPSY Left 11/10/2018  . BREAST LUMPECTOMY Left 2015  . BREAST LUMPECTOMY WITH AXILLARY LYMPH NODE BIOPSY  6/15   left  . BREAST RECONSTRUCTION WITH PLACEMENT OF TISSUE EXPANDER AND ALLODERM Bilateral 04/12/2019   Procedure: BILATERAL BREAST RECONSTRUCTION WITH  PLACEMENT OF TISSUE EXPANDERS; ALLODERM TO RIGHT CHEST;  Surgeon: Irene Limbo, MD;  Location: [place];  Service: Plastics;  Laterality: Bilateral;  . BREAST RECONSTRUCTION WITH PLACEMENT OF TISSUE EXPANDER AND ALLODERM Right 01/24/2020   Procedure: BREAST RECONSTRUCTION WITH PLACEMENT OF TISSUE EXPANDER AND ALLODERM;  Surgeon:  Irene Limbo, MD;  Location: [place];  Service: Plastics;  Laterality: Right;  . CARDIAC CATHETERIZATION    . CHOLECYSTECTOMY    . colon polyps    . COLONOSCOPY N/A 06/27/2014   Procedure: COLONOSCOPY;  Surgeon: Lear Ng, MD;  Location: [facility] ENDOSCOPY;  Service: Endoscopy;  Laterality: N/A;  . LATISSIMUS FLAP TO BREAST Left 04/12/2019   Procedure: LEFT LATISSIMUS DORSI FLAP TO LEFT CHEST;  Surgeon: Irene Limbo, MD;  Location: [place];  Service: Plastics;  Laterality: Left;  . LEFT HEART CATH AND CORONARY ANGIOGRAPHY N/A 07/24/2018   Procedure: LEFT HEART CATH AND CORONARY ANGIOGRAPHY;  Surgeon: Troy Sine, MD;  Location: [place] CV LAB;  Service: Cardiovascular;  Laterality: N/A;  . LIPOMA EXCISION    . LIPOSUCTION WITH LIPOFILLING Bilateral 06/12/2020   Procedure: LIPOFILLING TO BILATERAL CHEST;  Surgeon: Irene Limbo, MD;  Location: [place];  Service: Plastics;  Laterality: Bilateral;  . MASTECTOMY W/ SENTINEL NODE BIOPSY Bilateral 04/12/2019   Procedure: RIGHT BREAST RISK REDUCING MASTECTOMY; LEFT MASTECTOMY WITH LEFT AXILLARY SENTINEL LYMPH NODE BIOPSY WITH BLUE DYE INJECTION;  Surgeon: [name], MD;  Location: [place];  Service: General;  Laterality: Bilateral;  . MYOMECTOMY    . PORT-A-CATH REMOVAL N/A 02/03/2015   Procedure: REMOVAL PORT-A-CATH;  Surgeon: [name], MD;  Location: [place];  Service: General;  Laterality: N/A;  . PORT-A-CATH REMOVAL Right 01/24/2020   Procedure: REMOVAL PORT-A-CATH;  Surgeon: Irene Limbo, MD;  Location: [place];  Service: Plastics;  Laterality: Right;  . PORTACATH PLACEMENT N/A 03/01/2014   Procedure: INSERTION PORT-A-CATH;  Surgeon: [name], MD;  Location: [place];  Service: General;  Laterality: N/A;  . PORTACATH PLACEMENT N/A 11/18/2018   Procedure: INSERTION PORT-A-CATH WITH ULTRASOUND;  Surgeon: [name], MD;  Location: [place];  Service: General;  Laterality: N/A;  . REMOVAL OF BILATERAL TISSUE EXPANDERS WITH PLACEMENT OF BILATERAL BREAST IMPLANTS Bilateral 06/12/2020   Procedure: REMOVAL OF BILATERAL TISSUE EXPANDERS WITH PLACEMENT OF BILATERAL BREAST IMPLANTS;  Surgeon: Irene Limbo, MD;  Location: [place];  Service: Plastics;  Laterality: Bilateral;  . REMOVAL OF TISSUE EXPANDER AND PLACEMENT OF IMPLANT Right 06/02/2019   Procedure: REMOVAL OF TISSUE EXPANDER RIGHT CHEST;  Surgeon: Irene Limbo, MD;  Location: [place];  Service: Plastics;  Laterality: Right;  . TONSILLECTOMY    . TUBAL LIGATION    . WOUND DEBRIDEMENT Left 06/02/2019   Procedure: LEFT CHEST DEBRIDEMENT;  Surgeon: Irene Limbo, MD;  Location: [place];  Service: Plastics;  Laterality: Left;    FAMILY HISTORY: Family History  Problem Relation Age of Onset  . Endometrial cancer Mother 20  . Hearing loss Mother   . Atrial fibrillation Mother   . Lung cancer Father   . Brain cancer Father   . Heart disease Maternal Aunt   . Atrial fibrillation Maternal Aunt   . Lung cancer Maternal Uncle   . Atrial fibrillation Maternal Uncle   . Stroke Maternal Grandmother   . Stroke Maternal Grandfather   . Bladder Cancer Maternal Uncle   . Multiple myeloma Maternal Uncle   .  Other Son        trisomy 12  . Colon polyps Cousin   . Colon cancer Neg Hx   . Esophageal cancer Neg Hx   . Rectal cancer Neg Hx   . Stomach cancer Neg Hx    The patient's father died at the age of 58 from what may have been metastatic lung cancer. The patient knows very little about the father's side of her family. Her mother is alive at age 48. She was recently diagnosed with endometrial cancer. The patient has one brother, 2 sisters. There is no history of breast or ovarian cancer in the family.   GYNECOLOGIC HISTORY:  Menarche age 31, first live birth age 7. The patient is GX P3. One  child died shortly after birth. She stopped having periods approximately 2005. She did not use hormone replacement. 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 Son [name] is  Nurse, adult in [place]. Daughter [name] lives in [place] and works for [employer] as an Therapist, sports.  The patient has three grandchildren.    ADVANCED DIRECTIVES: Not in place   HEALTH MAINTENANCE: Social History   Tobacco Use  . Smoking status: Former Smoker    Packs/day: 0.50    Years: 15.00    Pack years: 7.50    Types: Cigarettes  . Smokeless tobacco: Never Used  . Tobacco comment: Quit smoking cigarettes in 2002  Vaping Use  . Vaping Use: Never used  Substance Use Topics  . Alcohol use: Not Currently  . Drug use: No     Colonoscopy: 06/27/2014, normal, [name]  PAP: 2013  Bone density: 04/10/2015, -1.1 ([facility])  Lipid panel:  Allergies  Allergen Reactions  . Chlorhexidine Itching  . Ciprofloxacin Anxiety, Rash and Other (See Comments)    GI upset  . Prednisone Shortness Of Breath and Other (See Comments)    "jacks me up" jittery   . Codeine Nausea Only  . Cortisone Swelling and Other (See Comments)    "jack me up"     Current Outpatient Medications  Medication Sig Dispense Refill  . amoxicillin (AMOXIL) 500 MG capsule Take 2 capsules (1,000 mg total) by mouth daily for 10 days. 20 capsule 0  . augmented betamethasone dipropionate (DIPROLENE-AF) 0.05 % cream Apply 1 application topically 2 (two) times daily as needed.    . diltiazem (CARDIZEM CD) 240 MG 24 hr capsule Take 1 capsule (240 mg total) by mouth daily. 90 capsule 3  . esomeprazole (NEXIUM) 40 MG capsule Take 1 capsule (40 mg total) by mouth daily at 12  noon. 30 capsule 11  . liothyronine (CYTOMEL) 5 MCG tablet Take 5 mcg by mouth daily before breakfast.     . Saccharomyces boulardii (PROBIOTIC) 250 MG CAPS Take 1 tablet by mouth every other day. 60 capsule   . TIROSINT 137 MCG CAPS Take 1 capsule by mouth every morning.    . triamterene-hydrochlorothiazide (MAXZIDE-25) 37.5-25 MG tablet Take 1 tablet by mouth as needed. 90 tablet 0   No current facility-administered medications for this visit.    OBJECTIVE: Middle-aged white woman in no acute distress Vitals:   02/01/21 0932  BP: 126/69  Pulse: 65  Resp: 18  Temp: 97.7 F (36.5 C)  SpO2: 98%  Wt Readings from Last 3 Encounters:  02/01/21 168 lb 9.6 oz (76.5 kg)  12/26/20 174 lb 6.4 oz (79.1 kg)  08/04/20 196 lb 4.8 oz (89 kg)   Body mass index is 28.06 kg/m.    ECOG FS:1 - Symptomatic but completely ambulatory GENERAL: Patient is a well appearing female in no acute distress HEENT:  Sclerae anicteric. Mask in place Neck is supple.  NODES:  No cervical, supraclavicular, or axillary lymphadenopathy palpated.  BREAST EXAM:  S/p bilateral mastectomies and reconstruction, s/p left breast radiation, no sign of local recurrence,  LUNGS:  Clear to auscultation bilaterally.  No wheezes or rhonchi. HEART:  Regular rate and rhythm. No murmur appreciated. ABDOMEN:  Soft, nontender.  Positive, normoactive bowel sounds. No organomegaly palpated. MSK:  No focal spinal tenderness to palpation. Full range of motion bilaterally in the upper extremities. EXTREMITIES:  No peripheral edema.   SKIN:  Clear with no obvious rashes or skin changes. No nail dyscrasia. NEURO:  Nonfocal. Well oriented.  Appropriate affect.     LAB RESULTS: No results found for: SPEP  Lab Results  Component Value Date   WBC 8.9 02/01/2021   NEUTROABS 5.5 02/01/2021   HGB 13.8 02/01/2021   HCT 40.6 02/01/2021   MCV 91.2 02/01/2021   PLT 286 02/01/2021      Chemistry      Component Value Date/Time   NA  141 02/01/2021 0909   NA 138 01/09/2021 0855   NA 140 06/19/2017 1312   K 3.8 02/01/2021 0909   K 3.5 06/19/2017 1312   CL 104 02/01/2021 0909   CO2 27 02/01/2021 0909   CO2 25 06/19/2017 1312   BUN 15 02/01/2021 0909   BUN 13 01/09/2021 0855   BUN 18.4 06/19/2017 1312   CREATININE 0.75 02/01/2021 0909   CREATININE 0.79 06/09/2019 1326   CREATININE 0.8 06/19/2017 1312      Component Value Date/Time   CALCIUM 10.3 02/01/2021 0909   CALCIUM 10.2 06/19/2017 1312   ALKPHOS 90 02/01/2021 0909   ALKPHOS 86 06/19/2017 1312   AST 23 02/01/2021 0909   AST 69 (H) 06/09/2019 1326   AST 15 06/19/2017 1312   ALT 24 02/01/2021 0909   ALT 40 06/09/2019 1326   ALT 22 06/19/2017 1312   BILITOT 0.4 02/01/2021 0909   BILITOT 0.4 01/09/2021 0855   BILITOT 0.3 06/09/2019 1326   BILITOT 0.26 06/19/2017 1312      No results found for: LABCA2  No components found for: LABCA125  No results for input(s): INR in the last 168 hours.  Urinalysis    Component Value Date/Time   COLORURINE STRAW (A) 03/12/2020 1425   APPEARANCEUR CLEAR 03/12/2020 1425   LABSPEC 1.010 03/12/2020 1425   LABSPEC 1.020 06/15/2014 1101   PHURINE 6.5 03/12/2020 1425   GLUCOSEU NEGATIVE 03/12/2020 1425   GLUCOSEU Negative 06/15/2014 1101   HGBUR NEGATIVE 03/12/2020 1425   BILIRUBINUR NEGATIVE 03/12/2020 1425   BILIRUBINUR Color Interference 06/15/2014 1101   KETONESUR NEGATIVE 03/12/2020 1425   PROTEINUR NEGATIVE 03/12/2020 1425   UROBILINOGEN 0.2 06/15/2014 1101   NITRITE NEGATIVE 03/12/2020 1425   LEUKOCYTESUR NEGATIVE 03/12/2020 1425   LEUKOCYTESUR Trace 06/15/2014 1101    STUDIES: No results found.   ASSESSMENT: 60 y.o.  woman status post left breast upper inner quadrant biopsy 02/08/2014 for a clinical T1c N0, stage IA invasive ductal carcinoma, grade 3, triple negative, with an MIB-1 of 50%.  (1) status post left lumpectomy and sentinel lymph node  sampling 03/01/2014 for a pT1c pN0, stage  IA invasive ductal carcinoma, grade 3, with negative margins, repeat prognostic panel again triple negative.  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  Left breast with breath hold technique/ 45 Gy at 1.8 Gy per fraction x 25 fractions.   Left breast boost/ 16 Gy at 2 Gy per fraction x 8 fractions  (4) genetics counseling 07/06/2015 through the Breast/Ovarian cancer gene panel offered by [facility] found no deleterious mutations in ATM, BARD1, BRCA1, BRCA2, BRIP1, CDH1, CHEK2, EPCAM, FANCC, MLH1, MSH2, MSH6, NBN, PALB2, PMS2, PTEN, RAD51C, RAD51D, TP53, and XRCC2.  (5) question of sleep apnea  (6) transverse colon lesion noted on CT scan from 06/17/2014 - negative biopsy, also showed fatty liver  (a) repeat CT scan of the abdomen and pelvis with contrast 02/15/2016 negative  RECURRENT DISEASE: February 2020 (7) biopsy of palpable mass upper inner left breast 11/10/2018 confirms invasive ductal carcinoma, grade 2 or 3, triple negative, with an MIB-1 of 80%.  (a) CT scans of the chest abdomen and pelvis with contrast showed no stage IV disease  (b) bone scan 11/13/2018 shows no definite metastasis to bone  (8) neoadjuvant chemotherapy consisting of carboplatin and gemcitabine given days 1 and 8 of each 21-day cycle, for 6 cycles, started 11/26/2018, completed 03/18/2019  (a) repeat ultrasound 01/25/2019 (after 3 cycles) shows no change in measurable breast mass   (9) bilateral mastectomies as follows   (a) left mastectomy and sentinel lymph node sampling  with immediate tissue expander placement and latissimus flap 04/12/2019 showed a residual ypT1a ypN0 invasive ductal carcinoma, grade 3, with negative margins; a single sentinel lymph node was removed.  Margins were negative  (b) right mastectomy with immediate implant placement showed no malignancy  (c) right expander removed 06/02/2019 secondary to infectious complications  (10) molecular testing on the 11/10/2018 biopsy showed negative PD-L1, negative androgen receptor, stable MSI and proficient mismatch repair status.  The tumor mutational burden was low.  No PIK3 mutation was detected.  There was a pathogenic TP53 variant and negative ER PR and HER-2 were confirmed.  (11) Adjuvant pembrolizumab given every 3 weeks beginning 06/09/2019, discontinued 09/22/2019 with rash  (12) Adjuvant capecitabine 1577m PO BID for 14 days on and 7 days off on a 21 day cycle starting 06/30/2019, discontinued January/2021 due to diarrhea   PLAN: CRunellcontinues on observation for her recurrent triple negative breast cancer.  She has been off treatment for over a year.  She has no clinical or radiographic sign of breast cancer recurrence.    I congratulated her on her weight loss, and encouraged her to continue with healthy diet and exercise.  She is going to f/u with Dr. TIran Planasin 04/2021 to discuss whether or not to move forward with another plastic surgery.  CHoyle Sauermet with Dr. MJana Hakimtoday.  In his last note he mentioned something about getting photos during this visit, and I asked him whether or not those were needed today.  CArnishawill return in 07/2021 for labs and f/u with Dr. MJana Hakim  I will see her back in 1 year for labs and  f/u.  She knows to call for any other issue that may develop before the next visit  Total encounter time 20 minutes.Wilber Bihari, NP 02/01/21 9:56 AM Medical Oncology and Hematology [facility] [place], [place] [address] Tel.  [phone]    Fax. [phone]   ADDENDUM: [name] is now close to 2 years out from definitive surgery for her breast cancer with no evidence of disease recurrence.  This is very favorable.  Although more so because triple negative breast cancer if it is going to recur tends to recur early.  We discussed some of her reconstruction options and I have commended her continuing weight loss plan.  I personally saw this patient and performed a substantive portion of this encounter with the listed APP documented above.   Chauncey Cruel, MD Medical Oncology and Hematology [facility] [address] [place], [place] [address] Tel. [phone]    Fax. [phone]    *Total Encounter Time as defined by the Centers for Medicare and Medicaid Services includes, in addition to the face-to-face time of a patient visit (documented in the note above) non-face-to-face time: obtaining and reviewing outside history, ordering and reviewing medications, tests or procedures, care coordination (communications with other health care professionals or caregivers) and documentation in the medical record.

## 2021-02-12 ENCOUNTER — Ambulatory Visit (INDEPENDENT_AMBULATORY_CARE_PROVIDER_SITE_OTHER): Payer: No Typology Code available for payment source | Admitting: Pharmacist

## 2021-02-12 ENCOUNTER — Encounter: Payer: Self-pay | Admitting: Pharmacist

## 2021-02-12 DIAGNOSIS — E782 Mixed hyperlipidemia: Secondary | ICD-10-CM

## 2021-02-12 MED ORDER — ROSUVASTATIN CALCIUM 5 MG PO TABS: 5.0000 mg | ORAL_TABLET | Freq: Every day | ORAL | 0 refills | Status: DC

## 2021-02-12 NOTE — Progress Notes (Signed)
Patient ID: Alexandria Bradley                 DOB: 1961/02/11                    MRN: [id number]     HPI: Alexandria Bradley is a 60 y.o. female patient referred to lipid clinic by Alexandria Bradley. PMH is significant for palpitations, HTN, breast cancer, and HLD.  Patient presents today for first visit with lipid clinic.  Has recently finished chemotherapy for breast cancer and is tired of medications and treatment.  Has not been on any lipid lowering therapy before.  Uses [employer] who ships her meals.  Most of the meals are high in vegetables such as broccoli, brussel sprouts, lettuce, and tomatoes.  Does not each much meat.  Uses a treadmill every day for about 30-40 minutes.  Also uses hand weights for upper body strength.  Her mother did not have any issues with CAD.  Unsure about the PMH of her father since he died very young.    Current Medications: n/a Intolerances: n/a Risk Factors: HTN, CAD LDL goal: <70  Social History: Does not drink alcohol  Labs: TC 229, Trigs 139, HDL 36, LDL 167 (01/09/21 - not on any lipid lowering therapy)  Past Medical History:  Diagnosis Date  . Anxiety   . Arthritis   . Back pain   . Breast cancer ([place])    left  . Fatty liver   . GERD (gastroesophageal reflux disease)   . Headache(784.0)    PMH : migraines  . History of kidney stones   . Hypercholesterolemia   . Hypertension   . Hypothyroidism   . Osteoporosis   . Personal history of chemotherapy   . Personal history of radiation therapy   . PONV (postoperative nausea and vomiting)    difficult to wake up  . Pre-diabetes   . Psoriasis   . PVC's (premature ventricular contractions)   . Radiation 08/18/14-10/07/14   Left breast  . Sleep apnea    no CPAP    Current Outpatient Medications on File Prior to Visit  Medication Sig Dispense Refill  . augmented betamethasone dipropionate (DIPROLENE-AF) 0.05 % cream Apply 1 application topically 2 (two) times daily as needed.    . diltiazem  (CARDIZEM CD) 240 MG 24 hr capsule TAKE 1 CAPSULE BY MOUTH EVERY DAY 90 capsule 3  . esomeprazole (NEXIUM) 40 MG capsule Take 1 capsule (40 mg total) by mouth daily at 12 noon. 30 capsule 11  . liothyronine (CYTOMEL) 5 MCG tablet Take 5 mcg by mouth daily before breakfast.     . Saccharomyces boulardii (PROBIOTIC) 250 MG CAPS Take 1 tablet by mouth every other day. 60 capsule   . TIROSINT 137 MCG CAPS Take 1 capsule by mouth every morning.    . triamterene-hydrochlorothiazide (MAXZIDE-25) 37.5-25 MG tablet Take 1 tablet by mouth as needed. 90 tablet 0  . [DISCONTINUED] prochlorperazine (COMPAZINE) 10 MG tablet Take 1 tablet (10 mg total) by mouth every 6 (six) hours as needed (Nausea or vomiting). 30 tablet 1   No current facility-administered medications on file prior to visit.    Allergies  Allergen Reactions  . Chlorhexidine Itching  . Ciprofloxacin Anxiety, Rash and Other (See Comments)    GI upset  . Prednisone Shortness Of Breath and Other (See Comments)    "jacks me up" jittery   . Codeine Nausea Only  . Cortisone Swelling and Other (See Comments)    "  jack me up"     Assessment/Plan:  1. Hyperlipidemia - Patient LDL 167 which is above goal of <70 while not on any lipid lowering medications.  Also possible her chemotherapy treatments may have increased her lipid levels.    Discussed importance of lipid lowering and possible consequences of uncontrolled cholesterol.  After discussion, patient is willing to start low dose rosuvastatin 5mg  once daily.    Recommended patient continue her diet with [employer] because it appears to be heart healthy.  Recommend patient also continue her exercise routine of using her treadmill for 30-40 minutes a day 5-6 days a week.  Patient voiced understanding.  Will recheck lipid panel in 3 months.  Start rosuvastatin 5mg  daily

## 2021-02-12 NOTE — Patient Instructions (Addendum)
It was nice meeting you today!  We would like your LDL (bad cholesterol) to be less than 70  Continue your heart friendly diet of vegetables, lean proteins, healthy fats, and whole grains  Continue your exercise 5-6 times a week  We will start a new medication called rosuvastatin (Crestor) which you will take once daily  We will recheck your cholesterol in about three months  Please call if you notice any adverse effects  Karren Cobble, PharmD, BCACP, CDCES, [place] [address] [place], [place] [address] Phone: [phone]; Fax: [phone] 02/12/2021 9:57 AM

## 2021-03-21 ENCOUNTER — Other Ambulatory Visit: Payer: Self-pay

## 2021-03-21 ENCOUNTER — Encounter: Payer: Self-pay | Admitting: Family Medicine

## 2021-03-21 ENCOUNTER — Ambulatory Visit (INDEPENDENT_AMBULATORY_CARE_PROVIDER_SITE_OTHER): Payer: No Typology Code available for payment source | Admitting: Family Medicine

## 2021-03-21 VITALS — BP 130/76 | HR 90 | Temp 98.5°F | Ht 65.0 in | Wt 163.4 lb

## 2021-03-21 DIAGNOSIS — E118 Type 2 diabetes mellitus with unspecified complications: Secondary | ICD-10-CM

## 2021-03-21 DIAGNOSIS — R509 Fever, unspecified: Secondary | ICD-10-CM

## 2021-03-21 DIAGNOSIS — T451X5A Adverse effect of antineoplastic and immunosuppressive drugs, initial encounter: Secondary | ICD-10-CM

## 2021-03-21 DIAGNOSIS — R61 Generalized hyperhidrosis: Secondary | ICD-10-CM | POA: Diagnosis not present

## 2021-03-21 DIAGNOSIS — G620 Drug-induced polyneuropathy: Secondary | ICD-10-CM

## 2021-03-21 LAB — CBC WITH DIFFERENTIAL/PLATELET
Basophils Absolute: 0.1 10*3/uL (ref 0.0–0.1)
Basophils Relative: 0.4 % (ref 0.0–3.0)
Eosinophils Absolute: 0.2 10*3/uL (ref 0.0–0.7)
Eosinophils Relative: 1.8 % (ref 0.0–5.0)
HCT: 38.8 % (ref 36.0–46.0)
Hemoglobin: 12.9 g/dL (ref 12.0–15.0)
Lymphocytes Relative: 13.2 % (ref 12.0–46.0)
Lymphs Abs: 1.5 10*3/uL (ref 0.7–4.0)
MCHC: 33.3 g/dL (ref 30.0–36.0)
MCV: 92.2 fl (ref 78.0–100.0)
Monocytes Absolute: 0.9 10*3/uL (ref 0.1–1.0)
Monocytes Relative: 8.1 % (ref 3.0–12.0)
Neutro Abs: 8.9 10*3/uL — ABNORMAL HIGH (ref 1.4–7.7)
Neutrophils Relative %: 76.5 % (ref 43.0–77.0)
Platelets: 323 10*3/uL (ref 150.0–400.0)
RBC: 4.2 Mil/uL (ref 3.87–5.11)
RDW: 14 % (ref 11.5–15.5)
WBC: 11.7 10*3/uL — ABNORMAL HIGH (ref 4.0–10.5)

## 2021-03-21 LAB — T4, FREE: Free T4: 1.04 ng/dL (ref 0.60–1.60)

## 2021-03-21 LAB — TSH: TSH: 0.18 u[IU]/mL — ABNORMAL LOW (ref 0.35–4.50)

## 2021-03-21 NOTE — Patient Instructions (Signed)
Give [place] 2-3 business days to get the results of your labs back.   Heat (pad or rice pillow in microwave) over affected area, 10-15 minutes twice daily.   Reach out to your oncology team regarding your fevers.   Let us know if you need anything.  EXERCISES  RANGE OF MOTION (ROM) AND STRETCHING EXERCISES - Low Back Pain Most people with lower back pain will find that their symptoms get worse with excessive bending forward (flexion) or arching at the lower back (extension). The exercises that will help resolve your symptoms will focus on the opposite motion.  If you have pain, numbness or tingling which travels down into your buttocks, leg or foot, the goal of the therapy is for these symptoms to move closer to your back and eventually resolve. Sometimes, these leg symptoms will get better, but your lower back pain may worsen. This is often an indication of progress in your rehabilitation. Be very alert to any changes in your symptoms and the activities in which you participated in the 24 hours prior to the change. Sharing this information with your caregiver will allow him or her to most efficiently treat your condition. These exercises may help you when beginning to rehabilitate your injury. Your symptoms may resolve with or without further involvement from your physician, physical therapist or athletic trainer. While completing these exercises, remember:  Restoring tissue flexibility helps normal motion to return to the joints. This allows healthier, less painful movement and activity. An effective stretch should be held for at least 30 seconds. A stretch should never be painful. You should only feel a gentle lengthening or release in the stretched tissue. FLEXION RANGE OF MOTION AND STRETCHING EXERCISES:  STRETCH - Flexion, Single Knee to Chest  Lie on a firm bed or floor with both legs extended in front of you. Keeping one leg in contact with the floor, bring your opposite knee to your chest.  Hold your leg in place by either grabbing behind your thigh or at your knee. Pull until you feel a gentle stretch in your low back. Hold 30 seconds. Slowly release your grasp and repeat the exercise with the opposite side. Repeat 2 times. Complete this exercise 3 times per week.   STRETCH - Flexion, Double Knee to Chest Lie on a firm bed or floor with both legs extended in front of you. Keeping one leg in contact with the floor, bring your opposite knee to your chest. Tense your stomach muscles to support your back and then lift your other knee to your chest. Hold your legs in place by either grabbing behind your thighs or at your knees. Pull both knees toward your chest until you feel a gentle stretch in your low back. Hold 30 seconds. Tense your stomach muscles and slowly return one leg at a time to the floor. Repeat 2 times. Complete this exercise 3 times per week.   STRETCH - Low Trunk Rotation Lie on a firm bed or floor. Keeping your legs in front of you, bend your knees so they are both pointed toward the ceiling and your feet are flat on the floor. Extend your arms out to the side. This will stabilize your upper body by keeping your shoulders in contact with the floor. Gently and slowly drop both knees together to one side until you feel a gentle stretch in your low back. Hold for 30 seconds. Tense your stomach muscles to support your lower back as you bring your knees back to  the starting position. Repeat the exercise to the other side. Repeat 2 times. Complete this exercise at least 3 times per week.   EXTENSION RANGE OF MOTION AND FLEXIBILITY EXERCISES:  STRETCH - Extension, Prone on Elbows  Lie on your stomach on the floor, a bed will be too soft. Place your palms about shoulder width apart and at the height of your head. Place your elbows under your shoulders. If this is too painful, stack pillows under your chest. Allow your body to relax so that your hips drop lower and make  contact more completely with the floor. Hold this position for 30 seconds. Slowly return to lying flat on the floor. Repeat 2 times. Complete this exercise 3 times per week.   RANGE OF MOTION - Extension, Prone Press Ups Lie on your stomach on the floor, a bed will be too soft. Place your palms about shoulder width apart and at the height of your head. Keeping your back as relaxed as possible, slowly straighten your elbows while keeping your hips on the floor. You may adjust the placement of your hands to maximize your comfort. As you gain motion, your hands will come more underneath your shoulders. Hold this position 30 seconds. Slowly return to lying flat on the floor. Repeat 2 times. Complete this exercise 3 times per week.   RANGE OF MOTION- Quadruped, Neutral Spine  Assume a hands and knees position on a firm surface. Keep your hands under your shoulders and your knees under your hips. You may place padding under your knees for comfort. Drop your head and point your tailbone toward the ground below you. This will round out your lower back like an angry cat. Hold this position for 30 seconds. Slowly lift your head and release your tail bone so that your back sags into a large arch, like an old horse. Hold this position for 30 seconds. Repeat this until you feel limber in your low back. Now, find your "sweet spot." This will be the most comfortable position somewhere between the two previous positions. This is your neutral spine. Once you have found this position, tense your stomach muscles to support your low back. Hold this position for 30 seconds. Repeat 2 times. Complete this exercise 3 times per week.   STRENGTHENING EXERCISES - Low Back Sprain These exercises may help you when beginning to rehabilitate your injury. These exercises should be done near your "sweet spot." This is the neutral, low-back arch, somewhere between fully rounded and fully arched, that is your least painful  position. When performed in this safe range of motion, these exercises can be used for people who have either a flexion or extension based injury. These exercises may resolve your symptoms with or without further involvement from your physician, physical therapist or athletic trainer. While completing these exercises, remember:  Muscles can gain both the endurance and the strength needed for everyday activities through controlled exercises. Complete these exercises as instructed by your physician, physical therapist or athletic trainer. Increase the resistance and repetitions only as guided. You may experience muscle soreness or fatigue, but the pain or discomfort you are trying to eliminate should never worsen during these exercises. If this pain does worsen, stop and make certain you are following the directions exactly. If the pain is still present after adjustments, discontinue the exercise until you can discuss the trouble with your caregiver.  STRENGTHENING - Deep Abdominals, Pelvic Tilt  Lie on a firm bed or floor. Keeping your legs in  front of you, bend your knees so they are both pointed toward the ceiling and your feet are flat on the floor. Tense your lower abdominal muscles to press your low back into the floor. This motion will rotate your pelvis so that your tail bone is scooping upwards rather than pointing at your feet or into the floor. With a gentle tension and even breathing, hold this position for 3 seconds. Repeat 2 times. Complete this exercise 3 times per week.   STRENGTHENING - Abdominals, Crunches  Lie on a firm bed or floor. Keeping your legs in front of you, bend your knees so they are both pointed toward the ceiling and your feet are flat on the floor. Cross your arms over your chest. Slightly tip your chin down without bending your neck. Tense your abdominals and slowly lift your trunk high enough to just clear your shoulder blades. Lifting higher can put excessive stress  on the lower back and does not further strengthen your abdominal muscles. Control your return to the starting position. Repeat 2 times. Complete this exercise 3 times per week.   STRENGTHENING - Quadruped, Opposite UE/LE Lift  Assume a hands and knees position on a firm surface. Keep your hands under your shoulders and your knees under your hips. You may place padding under your knees for comfort. Find your neutral spine and gently tense your abdominal muscles so that you can maintain this position. Your shoulders and hips should form a rectangle that is parallel with the floor and is not twisted. Keeping your trunk steady, lift your right hand no higher than your shoulder and then your left leg no higher than your hip. Make sure you are not holding your breath. Hold this position for 30 seconds. Continuing to keep your abdominal muscles tense and your back steady, slowly return to your starting position. Repeat with the opposite arm and leg. Repeat 2 times. Complete this exercise 3 times per week.   STRENGTHENING - Abdominals and Quadriceps, Straight Leg Raise  Lie on a firm bed or floor with both legs extended in front of you. Keeping one leg in contact with the floor, bend the other knee so that your foot can rest flat on the floor. Find your neutral spine, and tense your abdominal muscles to maintain your spinal position throughout the exercise. Slowly lift your straight leg off the floor about 6 inches for a count of 3, making sure to not hold your breath. Still keeping your neutral spine, slowly lower your leg all the way to the floor. Repeat this exercise with each leg 2 times. Complete this exercise 3 times per week.  POSTURE AND BODY MECHANICS CONSIDERATIONS - Low Back Sprain Keeping correct posture when sitting, standing or completing your activities will reduce the stress put on different body tissues, allowing injured tissues a chance to heal and limiting painful experiences. The  following are general guidelines for improved posture.  While reading these guidelines, remember: The exercises prescribed by your provider will help you have the flexibility and strength to maintain correct postures. The correct posture provides the best environment for your joints to work. All of your joints have less wear and tear when properly supported by a spine with good posture. This means you will experience a healthier, less painful body. Correct posture must be practiced with all of your activities, especially prolonged sitting and standing. Correct posture is as important when doing repetitive low-stress activities (typing) as it is when doing a single heavy-load  activity (lifting).  RESTING POSITIONS Consider which positions are most painful for you when choosing a resting position. If you have pain with flexion-based activities (sitting, bending, stooping, squatting), choose a position that allows you to rest in a less flexed posture. You would want to avoid curling into a fetal position on your side. If your pain worsens with extension-based activities (prolonged standing, working overhead), avoid resting in an extended position such as sleeping on your stomach. Most people will find more comfort when they rest with their spine in a more neutral position, neither too rounded nor too arched. Lying on a non-sagging bed on your side with a pillow between your knees, or on your back with a pillow under your knees will often provide some relief. Keep in mind, being in any one position for a prolonged period of time, no matter how correct your posture, can still lead to stiffness.  PROPER SITTING POSTURE In order to minimize stress and discomfort on your spine, you must sit with correct posture. Sitting with good posture should be effortless for a healthy body. Returning to good posture is a gradual process. Many people can work toward this most comfortably by using various supports until they have  the flexibility and strength to maintain this posture on their own. When sitting with proper posture, your ears will fall over your shoulders and your shoulders will fall over your hips. You should use the back of the chair to support your upper back. Your lower back will be in a neutral position, just slightly arched. You may place a small pillow or folded towel at the base of your lower back for  support.  When working at a desk, create an environment that supports good, upright posture. Without extra support, muscles tire, which leads to excessive strain on joints and other tissues. Keep these recommendations in mind:  CHAIR: A chair should be able to slide under your desk when your back makes contact with the back of the chair. This allows you to work closely. The chair's height should allow your eyes to be level with the upper part of your monitor and your hands to be slightly lower than your elbows.  BODY POSITION Your feet should make contact with the floor. If this is not possible, use a foot rest. Keep your ears over your shoulders. This will reduce stress on your neck and low back.  INCORRECT SITTING POSTURES  If you are feeling tired and unable to assume a healthy sitting posture, do not slouch or slump. This puts excessive strain on your back tissues, causing more damage and pain. Healthier options include: Using more support, like a lumbar pillow. Switching tasks to something that requires you to be upright or walking. Talking a brief walk. Lying down to rest in a neutral-spine position.  PROLONGED STANDING WHILE SLIGHTLY LEANING FORWARD  When completing a task that requires you to lean forward while standing in one place for a long time, place either foot up on a stationary 2-4 inch high object to help maintain the best posture. When both feet are on the ground, the lower back tends to lose its slight inward curve. If this curve flattens (or becomes too large), then the back and  your other joints will experience too much stress, tire more quickly, and can cause pain.  CORRECT STANDING POSTURES Proper standing posture should be assumed with all daily activities, even if they only take a few moments, like when brushing your teeth. As in sitting,  your ears should fall over your shoulders and your shoulders should fall over your hips. You should keep a slight tension in your abdominal muscles to brace your spine. Your tailbone should point down to the ground, not behind your body, resulting in an over-extended swayback posture.   INCORRECT STANDING POSTURES  Common incorrect standing postures include a forward head, locked knees and/or an excessive swayback. WALKING Walk with an upright posture. Your ears, shoulders and hips should all line-up.  PROLONGED ACTIVITY IN A FLEXED POSITION When completing a task that requires you to bend forward at your waist or lean over a low surface, try to find a way to stabilize 3 out of 4 of your limbs. You can place a hand or elbow on your thigh or rest a knee on the surface you are reaching across. This will provide you more stability, so that your muscles do not tire as quickly. By keeping your knees relaxed, or slightly bent, you will also reduce stress across your lower back. CORRECT LIFTING TECHNIQUES  DO : Assume a wide stance. This will provide you more stability and the opportunity to get as close as possible to the object which you are lifting. Tense your abdominals to brace your spine. Bend at the knees and hips. Keeping your back locked in a neutral-spine position, lift using your leg muscles. Lift with your legs, keeping your back straight. Test the weight of unknown objects before attempting to lift them. Try to keep your elbows locked down at your sides in order get the best strength from your shoulders when carrying an object.   Always ask for help when lifting heavy or awkward objects. INCORRECT LIFTING TECHNIQUES DO  NOT:  Lock your knees when lifting, even if it is a small object. Bend and twist. Pivot at your feet or move your feet when needing to change directions. Assume that you can safely pick up even a paperclip without proper posture.

## 2021-03-21 NOTE — Progress Notes (Signed)
Chief Complaint  Patient presents with   Gout   Fever    Subjective: Patient is a 60 y.o. female here for fevers.  Fevers of 99.5-100.5 F starting in evening. Lasts throughout the night. She feels chills. This has been going on for 3 mo, getting more freq, now experiencing it nightly. She is starting to sweat in the middle of the night. No recent travel to Tb endemic areas or sick contacts. She does have a hx of L sided breast cancer.  A couple months ago she was empirically treated for strep throat and felt somewhat better but symptoms returned after finishing the course of that.  Past Medical History:  Diagnosis Date   Anxiety    Arthritis    Back pain    Breast cancer ([place])    left   Fatty liver    GERD (gastroesophageal reflux disease)    Headache(784.0)    PMH : migraines   History of kidney stones    Hypercholesterolemia    Hypertension    Hypothyroidism    Osteoporosis    Personal history of chemotherapy    Personal history of radiation therapy    PONV (postoperative nausea and vomiting)    difficult to wake up   Pre-diabetes    Psoriasis    PVC's (premature ventricular contractions)    Radiation 08/18/14-10/07/14   Left breast   Sleep apnea    no CPAP    Objective: BP 130/76   Pulse 90   Temp 98.5 F (36.9 C) (Oral)   Ht 5\' 5"  (1.651 m)   Wt 163 lb 6 oz (74.1 kg)   SpO2 98%   BMI 27.19 kg/m  General: Awake, appears stated age Heart: RRR, no murmurs Lungs: CTAB, no rales, wheezes or rhonchi. No accessory muscle use Abdomen: Bowel sounds present, soft, nontender, nondistended. Psych: Age appropriate judgment and insight, normal affect and mood  Assessment and Plan: Fever, unspecified fever cause - Plan: TSH, T4, free, QuantiFERON-TB Gold Plus, CBC w/Diff, Urine Microscopic Only  Night sweats  Type 2 diabetes mellitus with complication, without long-term current use of insulin (HCC)  Neuropathy due to chemotherapeutic drug ([place]), Chronic  New  problem, uncertain prognosis.  Check above labs.  If they are normal, will consider ID referral.  She will also reach out to her oncology team to see if anything they are doing could explain her symptoms. The patient voiced understanding and agreement to the plan.  [place], DO 03/21/21  12:22 PM

## 2021-03-22 ENCOUNTER — Other Ambulatory Visit: Payer: Self-pay | Admitting: Oncology

## 2021-03-22 DIAGNOSIS — Z171 Estrogen receptor negative status [ER-]: Secondary | ICD-10-CM

## 2021-03-22 DIAGNOSIS — C50212 Malignant neoplasm of upper-inner quadrant of left female breast: Secondary | ICD-10-CM

## 2021-03-22 LAB — URINALYSIS, MICROSCOPIC ONLY: RBC / HPF: NONE SEEN (ref 0–?)

## 2021-03-23 ENCOUNTER — Telehealth: Payer: Self-pay | Admitting: *Deleted

## 2021-03-23 ENCOUNTER — Other Ambulatory Visit: Payer: Self-pay | Admitting: Family Medicine

## 2021-03-23 DIAGNOSIS — R61 Generalized hyperhidrosis: Secondary | ICD-10-CM

## 2021-03-23 DIAGNOSIS — R509 Fever, unspecified: Secondary | ICD-10-CM

## 2021-03-23 NOTE — Telephone Encounter (Signed)
Order placed as urgent

## 2021-03-23 NOTE — Progress Notes (Signed)
referral

## 2021-03-23 NOTE — Telephone Encounter (Signed)
Per MD review of pt's labs and notes of pt's issue of ongoing night time fever and night sweats - order placed for CT of the chest.  He stated concern that pt should proceed to ID consult asap and if fevers/symptoms worsen pt may need to proceed to the ER.  This RN discussed above with pt - who also stated last night she did not take tylenol at onset of chills- but " waited it out " with fever going as high as 101.7.  Per phone discussion this note will be sent to her primary MD for review of Dr Magrinat's recommendations.  Note direct  return call for this RN if needed is [phone].

## 2021-03-24 LAB — QUANTIFERON-TB GOLD PLUS
Mitogen-NIL: 3.92 IU/mL
NIL: 0.04 IU/mL
QuantiFERON-TB Gold Plus: NEGATIVE
TB1-NIL: 0.01 IU/mL
TB2-NIL: 0.01 IU/mL

## 2021-04-03 ENCOUNTER — Ambulatory Visit (HOSPITAL_COMMUNITY)
Admission: RE | Admit: 2021-04-03 | Discharge: 2021-04-03 | Disposition: A | Discharge status: Home / Self Care | Payer: No Typology Code available for payment source | Source: Ambulatory Visit | Attending: Oncology | Admitting: Oncology

## 2021-04-03 ENCOUNTER — Other Ambulatory Visit: Payer: Self-pay

## 2021-04-03 DIAGNOSIS — Z171 Estrogen receptor negative status [ER-]: Secondary | ICD-10-CM | POA: Insufficient documentation

## 2021-04-03 DIAGNOSIS — C50212 Malignant neoplasm of upper-inner quadrant of left female breast: Secondary | ICD-10-CM | POA: Diagnosis not present

## 2021-04-03 LAB — POCT I-STAT CREATININE: Creatinine, Ser: 0.7 mg/dL (ref 0.44–1.00)

## 2021-04-03 MED ORDER — IOHEXOL 300 MG/ML  SOLN
75.0000 mL | Freq: Once | INTRAMUSCULAR | Status: AC | PRN
  Administered 2021-04-03: 75 mL via INTRAVENOUS

## 2021-04-03 MED ORDER — SODIUM CHLORIDE (PF) 0.9 % IJ SOLN
INTRAMUSCULAR | Status: AC
  Filled 2021-04-03: qty 50

## 2021-04-04 ENCOUNTER — Other Ambulatory Visit: Payer: Self-pay | Admitting: Oncology

## 2021-04-05 ENCOUNTER — Telehealth: Payer: Self-pay

## 2021-04-05 MED ORDER — INDOMETHACIN 50 MG PO CAPS: 50.0000 mg | ORAL_CAPSULE | Freq: Three times a day (TID) | ORAL | 0 refills | Status: DC

## 2021-04-05 NOTE — Telephone Encounter (Signed)
I sent something else in. Did the pred help at all?

## 2021-04-05 NOTE — Telephone Encounter (Signed)
Patient called stating she was seen recently and given prednisone for Gout. She has finished the medication but her right foot specifically great toe is causing severe pain. She is wondering if anything else could be sent in beside prednisone to help with the flare.   She uses [facility]   Her call back is  [phone]

## 2021-04-05 NOTE — Addendum Note (Signed)
Addended by: Ames Coupe on: 04/05/2021 04:18 PM   Modules accepted: Orders

## 2021-04-06 MED ORDER — COLCHICINE 0.6 MG PO TABS: ORAL_TABLET | ORAL | 1 refills | Status: DC

## 2021-04-06 NOTE — Telephone Encounter (Signed)
Let's try that. If no better, I want to see her. Ty.

## 2021-04-06 NOTE — Telephone Encounter (Signed)
Called left message to call back 

## 2021-04-06 NOTE — Telephone Encounter (Signed)
Called the patient and no she has not ever been on that . This is the first time she has ever had gout

## 2021-04-06 NOTE — Addendum Note (Signed)
Addended by: Ames Coupe on: 04/06/2021 02:14 PM   Modules accepted: Orders

## 2021-04-06 NOTE — Telephone Encounter (Signed)
Informed the patient of PCP instructions. She verbalized understanding.

## 2021-04-06 NOTE — Telephone Encounter (Signed)
Has she been on colchicine?

## 2021-04-06 NOTE — Telephone Encounter (Signed)
She said the prednisone worked a little bit. Once out comes back. What was sent in helps with the pain, but bothers her stomach

## 2021-04-09 ENCOUNTER — Telehealth: Payer: Self-pay | Admitting: Family Medicine

## 2021-04-09 NOTE — Telephone Encounter (Signed)
Scheduled appt for 04/10/2021 at 10:15.

## 2021-04-09 NOTE — Telephone Encounter (Signed)
Pt states she saw Dr. Nani Ravens on Friday for gout an he provided her a rx. Pt states that the medicine is makes her real sick and she stopped taking it. Pt wants to do states she has pain in her side as well now. Please call pt to discuss

## 2021-04-09 NOTE — Telephone Encounter (Signed)
I'd like to see her again.  Gout does not cause pain in the side typically.

## 2021-04-10 ENCOUNTER — Other Ambulatory Visit: Payer: Self-pay

## 2021-04-10 ENCOUNTER — Ambulatory Visit (INDEPENDENT_AMBULATORY_CARE_PROVIDER_SITE_OTHER): Payer: No Typology Code available for payment source | Admitting: Internal Medicine

## 2021-04-10 ENCOUNTER — Other Ambulatory Visit: Payer: Self-pay | Admitting: Family Medicine

## 2021-04-10 ENCOUNTER — Ambulatory Visit (INDEPENDENT_AMBULATORY_CARE_PROVIDER_SITE_OTHER): Payer: No Typology Code available for payment source | Admitting: Family Medicine

## 2021-04-10 ENCOUNTER — Encounter: Payer: Self-pay | Admitting: Family Medicine

## 2021-04-10 ENCOUNTER — Encounter: Payer: Self-pay | Admitting: Internal Medicine

## 2021-04-10 VITALS — BP 130/86 | HR 73 | Temp 98.2°F | Ht 65.0 in | Wt 160.2 lb

## 2021-04-10 DIAGNOSIS — R1013 Epigastric pain: Secondary | ICD-10-CM

## 2021-04-10 DIAGNOSIS — M109 Gout, unspecified: Secondary | ICD-10-CM | POA: Insufficient documentation

## 2021-04-10 DIAGNOSIS — R109 Unspecified abdominal pain: Secondary | ICD-10-CM

## 2021-04-10 DIAGNOSIS — R82998 Other abnormal findings in urine: Secondary | ICD-10-CM

## 2021-04-10 DIAGNOSIS — M79672 Pain in left foot: Secondary | ICD-10-CM

## 2021-04-10 DIAGNOSIS — R232 Flushing: Secondary | ICD-10-CM | POA: Diagnosis not present

## 2021-04-10 DIAGNOSIS — M79671 Pain in right foot: Secondary | ICD-10-CM | POA: Diagnosis not present

## 2021-04-10 DIAGNOSIS — E876 Hypokalemia: Secondary | ICD-10-CM

## 2021-04-10 LAB — COMPREHENSIVE METABOLIC PANEL
ALT: 16 U/L (ref 0–35)
AST: 14 U/L (ref 0–37)
Albumin: 4.6 g/dL (ref 3.5–5.2)
Alkaline Phosphatase: 86 U/L (ref 39–117)
BUN: 13 mg/dL (ref 6–23)
CO2: 25 mEq/L (ref 19–32)
Calcium: 10.2 mg/dL (ref 8.4–10.5)
Chloride: 97 mEq/L (ref 96–112)
Creatinine, Ser: 0.77 mg/dL (ref 0.40–1.20)
GFR: 82.75 mL/min (ref >60.00)
Glucose, Bld: 106 mg/dL — ABNORMAL HIGH (ref 70–99)
Potassium: 3.3 mEq/L — ABNORMAL LOW (ref 3.5–5.1)
Sodium: 136 mEq/L (ref 135–145)
Total Bilirubin: 1.1 mg/dL (ref 0.2–1.2)
Total Protein: 7.6 g/dL (ref 6.0–8.3)

## 2021-04-10 LAB — URIC ACID: Uric Acid, Serum: 9.2 mg/dL — ABNORMAL HIGH (ref 2.4–7.0)

## 2021-04-10 LAB — URINALYSIS, MICROSCOPIC ONLY

## 2021-04-10 MED ORDER — LIDOCAINE VISCOUS HCL 2 % MT SOLN
15.0000 mL | Freq: Once | OROMUCOSAL | Status: AC
  Administered 2021-04-10: 15 mL via OROMUCOSAL

## 2021-04-10 MED ORDER — ONDANSETRON 4 MG PO TBDP: 4.0000 mg | ORAL_TABLET | Freq: Three times a day (TID) | ORAL | 0 refills | Status: DC | PRN

## 2021-04-10 MED ORDER — HYOSCYAMINE SULFATE 0.125 MG PO TBDP
0.1250 mg | ORAL_TABLET | Freq: Once | ORAL | Status: AC
  Administered 2021-04-10: 0.125 mg via SUBLINGUAL

## 2021-04-10 MED ORDER — TRAMADOL HCL 50 MG PO TABS: 50.0000 mg | ORAL_TABLET | Freq: Three times a day (TID) | ORAL | 0 refills | Status: AC | PRN

## 2021-04-10 MED ORDER — ALLOPURINOL 100 MG PO TABS: 100.0000 mg | ORAL_TABLET | Freq: Every day | ORAL | 6 refills | Status: DC

## 2021-04-10 MED ORDER — ALUM & MAG HYDROXIDE-SIMETH 400-400-40 MG/5ML PO SUSP
30.0000 mL | Freq: Once | ORAL | Status: AC
  Administered 2021-04-10: 30 mL via ORAL

## 2021-04-10 MED ORDER — POTASSIUM CHLORIDE ER 10 MEQ PO TBCR: 20.0000 meq | EXTENDED_RELEASE_TABLET | Freq: Every day | ORAL | 0 refills | Status: DC

## 2021-04-10 NOTE — Progress Notes (Signed)
[facility]      Reason for Consult: recurrent fevers    Referring Physician: Dr. Nani Ravens    Patient ID: Alexandria Bradley, female    DOB: 1961/04/24, 60 y.o.   MRN: [id number]  HPI:   She is here for concern for temperatures up to 100.5 in the evenings since about March.  Some night sweats.  She has had purposeful weight loss.  No cough, no congestion, no new diarrhea, no headache, no rash.  She has been diagnosed with gout recently that started on her left MTP of her great toe and now is on the other foot.  She has recently been on prednisone and tried indocin and colchicine but did not tolerate the side effects.  She never has had gout before but clinically diagnosed and her uric acid level done by her PCP today is elevated to 9.2.  As part of her evaluation of the fevers, she had a CT of her chest and was without concerns.  No dysuria and urinalysis today without WBCs or RBCs.    Past Medical History:  Diagnosis Date   Anxiety    Arthritis    Back pain    Breast cancer ([place])    left   Fatty liver    GERD (gastroesophageal reflux disease)    Headache(784.0)    PMH : migraines   History of kidney stones    Hypercholesterolemia    Hypertension    Hypothyroidism    Osteoporosis    Personal history of chemotherapy    Personal history of radiation therapy    PONV (postoperative nausea and vomiting)    difficult to wake up   Pre-diabetes    Psoriasis    PVC's (premature ventricular contractions)    Radiation 08/18/14-10/07/14   Left breast   Sleep apnea    no CPAP    Prior to Admission medications   Medication Sig Start Date End Date Taking? Authorizing Provider  augmented betamethasone dipropionate (DIPROLENE-AF) 0.05 % cream Apply 1 application topically 2 (two) times daily as needed. 08/25/19   [provider]  diltiazem (CARDIZEM CD) 240 MG 24 hr capsule TAKE 1 CAPSULE BY MOUTH EVERY DAY 02/02/21   Josue Hector, MD  esomeprazole  (NEXIUM) 40 MG capsule Take 1 capsule (40 mg total) by mouth daily at 12 noon. 06/08/20   Ladene Artist, MD  liothyronine (CYTOMEL) 5 MCG tablet Take 5 mcg by mouth daily before breakfast.  11/13/13   [provider]  ondansetron (ZOFRAN-ODT) 4 MG disintegrating tablet Take 1 tablet (4 mg total) by mouth every 8 (eight) hours as needed for nausea or vomiting. 04/10/21   Nani Ravens, Crosby Oyster, DO  rosuvastatin (CRESTOR) 5 MG tablet Take 1 tablet (5 mg total) by mouth daily. 02/12/21   Richardson Dopp T, PA-C  Saccharomyces boulardii (PROBIOTIC) 250 MG CAPS Take 1 tablet by mouth every other day. 11/24/19   Magrinat, Virgie Dad, MD  TIROSINT 137 MCG CAPS Take 1 capsule by mouth every morning. 11/03/19   [provider]  traMADol (ULTRAM) 50 MG tablet Take 1 tablet (50 mg total) by mouth every 8 (eight) hours as needed for up to 5 days. 04/10/21 04/15/21  [name], DO  triamterene-hydrochlorothiazide (MAXZIDE-25) 37.5-25 MG tablet Take 1 tablet by mouth as needed. 12/26/20   Richardson Dopp T, PA-C  prochlorperazine (COMPAZINE) 10 MG tablet Take 1 tablet (10 mg total) by mouth every 6 (six) hours as needed (Nausea  or vomiting). 11/13/18 05/11/19  Magrinat, Virgie Dad, MD    Allergies  Allergen Reactions   Chlorhexidine Itching   Ciprofloxacin Anxiety, Rash and Other (See Comments)    GI upset   Prednisone Shortness Of Breath and Other (See Comments)    "jacks me up" jittery    Codeine Nausea Only   Cortisone Swelling and Other (See Comments)    "jack me up"     Social History   Tobacco Use   Smoking status: Former    Packs/day: 0.50    Years: 15.00    Pack years: 7.50    Types: Cigarettes   Smokeless tobacco: Never   Tobacco comments:    Quit smoking cigarettes in 2002  Vaping Use   Vaping Use: Never used  Substance Use Topics   Alcohol use: Not Currently   Drug use: No    Family History  Problem Relation Age of Onset   Endometrial cancer Mother 60    Hearing loss Mother    Atrial fibrillation Mother    Lung cancer Father    Brain cancer Father    Heart disease Maternal Aunt    Atrial fibrillation Maternal Aunt    Lung cancer Maternal Uncle    Atrial fibrillation Maternal Uncle    Stroke Maternal Grandmother    Stroke Maternal Grandfather    Bladder Cancer Maternal Uncle    Multiple myeloma Maternal Uncle    Other Son        trisomy 13   Colon polyps Cousin    Colon cancer Neg Hx    Esophageal cancer Neg Hx    Rectal cancer Neg Hx    Stomach cancer Neg Hx      Review of Systems  Constitutional: positive for fevers, chills, sweats, and fatigue Respiratory: negative for cough, sputum, or hemoptysis Genitourinary: negative for dysuria Neurological: negative for headaches and dizziness All other systems reviewed and are negative    Constitutional: in no apparent distress There were no vitals filed for this visit. EYES: anicteric ENMT: no thrush Cardiovascular: Cor RRR Respiratory: clear GI: Bowel sounds are normal, liver is not enlarged, spleen is not enlarged Musculoskeletal: no pedal edema noted Skin: negatives: no rashes or suspicious lesions Neuro: non-focal  Labs: Lab Results  Component Value Date   WBC 11.7 (H) 03/21/2021   HGB 12.9 03/21/2021   HCT 38.8 03/21/2021   MCV 92.2 03/21/2021   PLT 323.0 03/21/2021    Lab Results  Component Value Date   CREATININE 0.77 04/10/2021   BUN 13 04/10/2021   NA 136 04/10/2021   K 3.3 (L) 04/10/2021   CL 97 04/10/2021   CO2 25 04/10/2021    Lab Results  Component Value Date   ALT 16 04/10/2021   AST 14 04/10/2021   ALKPHOS 86 04/10/2021   BILITOT 1.1 04/10/2021     Assessment:  gout flare with fever, night sweats. No signs of infectious source based on symptoms and recent work up.  Her main symptom has been related to gout.  I discussed the findings and with no other concerns, gout is her likely source of fevers and night sweats, particularly since it improved  with gout treatment.    Plan: 1) treatment of gout per her PCP 2) follow up if new symptoms develop

## 2021-04-10 NOTE — Progress Notes (Signed)
mag

## 2021-04-10 NOTE — Progress Notes (Signed)
Chief Complaint  Patient presents with   Flank Pain   Gout    Dark urine in the morning     Subjective: Patient is a 60 y.o. female here for f/u.  The patient was treated for suspected gout flare of her feet.  She was placed on prednisone which gave her side effects.  She was changed to indomethacin which gave her abdominal pain.  She was then changed to colchicine which also gave her side effects.  Each of these medicines helped when she was taking them but she was not on them long enough to notice the benefit.  Eating and drinking is severely reduced.  Her urine has been very dark.  She is not having right mid/lower back pain.  Unsure if she is having any blood in her urine.  She has been dealing with chronic nocturnal fevers and notes nothing new.  Currently both of her feet hurt, she has flank pain, and dark urine.  She is fatigued as well due to poor oral intake.  She stopped taking her Nexium.  Past Medical History:  Diagnosis Date   Anxiety    Arthritis    Back pain    Breast cancer ([place])    left   Fatty liver    GERD (gastroesophageal reflux disease)    Headache(784.0)    PMH : migraines   History of kidney stones    Hypercholesterolemia    Hypertension    Hypothyroidism    Osteoporosis    Personal history of chemotherapy    Personal history of radiation therapy    PONV (postoperative nausea and vomiting)    difficult to wake up   Pre-diabetes    Psoriasis    PVC's (premature ventricular contractions)    Radiation 08/18/14-10/07/14   Left breast   Sleep apnea    no CPAP    Objective: BP 130/86   Pulse 73   Temp 98.2 F (36.8 C) (Oral)   Ht 5\' 5"  (1.651 m)   Wt 160 lb 4 oz (72.7 kg)   SpO2 96%   BMI 26.67 kg/m  General: Awake, appears stated age MSK: Tenderness over the right CVA, negative Lloyd's Heart: RRR, no murmurs Lungs: CTAB, no rales, wheezes or rhonchi. No accessory muscle use Abdomen: Bowel sounds present, tender in the epigastric region, no  masses or organomegaly Psych: Age appropriate judgment and insight, normal affect and mood  Assessment and Plan: Flank pain - Plan: Urine Microscopic Only  Bilateral foot pain - Plan: Uric acid, traMADol (ULTRAM) 50 MG tablet  Dark urine - Plan: Comprehensive metabolic panel, Urine Microscopic Only  Flushing - Plan: 5-HIAA, Plasma  Epigastric pain - Plan: ondansetron (ZOFRAN-ODT) 4 MG disintegrating tablet, alum & mag hydroxide-simeth (MAALOX PLUS) 400-400-40 MG/5ML suspension 30 mL, lidocaine (XYLOCAINE) 2 % viscous mouth solution 15 mL, hyoscyamine (ANASPAZ) disintergrating tablet 0.125 mg  Will check urine.  If normal will have her participate in a home exercise program.  If infection, will treat with antibiotics.  If just bleeding, will add tamsulosin. Trial Ultram for pain.  She has failed prednisone, nonsteroidal anti-inflammatories, colchicine and gets sick with hydrocodone.  Check uric acid level. Check urine and blood work.  Abd pain-add back Nexium, add famotidine 20 mg twice daily.  Zofran as needed. The patient voiced understanding and agreement to the plan.  [place], DO 04/10/21  11:36 AM

## 2021-04-10 NOTE — Patient Instructions (Addendum)
We need to get back on the Nexium. Please add Pepcid (famotidine) 20 mg twice daily.  Use the nausea medicine before eating/drinking.  OK to take Tylenol 1000 mg (2 extra strength tabs) or 975 mg (3 regular strength tabs) every 6 hours as needed.  Do not drink alcohol, do any illicit/street drugs, drive or do anything that requires alertness while on the tramadol.   Try to push fluids.   Let us know if you need anything.  EXERCISES  RANGE OF MOTION (ROM) AND STRETCHING EXERCISES - Low Back Pain Most people with lower back pain will find that their symptoms get worse with excessive bending forward (flexion) or arching at the lower back (extension). The exercises that will help resolve your symptoms will focus on the opposite motion.  If you have pain, numbness or tingling which travels down into your buttocks, leg or foot, the goal of the therapy is for these symptoms to move closer to your back and eventually resolve. Sometimes, these leg symptoms will get better, but your lower back pain may worsen. This is often an indication of progress in your rehabilitation. Be very alert to any changes in your symptoms and the activities in which you participated in the 24 hours prior to the change. Sharing this information with your caregiver will allow him or her to most efficiently treat your condition. These exercises may help you when beginning to rehabilitate your injury. Your symptoms may resolve with or without further involvement from your physician, physical therapist or athletic trainer. While completing these exercises, remember:  Restoring tissue flexibility helps normal motion to return to the joints. This allows healthier, less painful movement and activity. An effective stretch should be held for at least 30 seconds. A stretch should never be painful. You should only feel a gentle lengthening or release in the stretched tissue. FLEXION RANGE OF MOTION AND STRETCHING EXERCISES:  STRETCH -  Flexion, Single Knee to Chest  Lie on a firm bed or floor with both legs extended in front of you. Keeping one leg in contact with the floor, bring your opposite knee to your chest. Hold your leg in place by either grabbing behind your thigh or at your knee. Pull until you feel a gentle stretch in your low back. Hold 30 seconds. Slowly release your grasp and repeat the exercise with the opposite side. Repeat 2 times. Complete this exercise 3 times per week.   STRETCH - Flexion, Double Knee to Chest Lie on a firm bed or floor with both legs extended in front of you. Keeping one leg in contact with the floor, bring your opposite knee to your chest. Tense your stomach muscles to support your back and then lift your other knee to your chest. Hold your legs in place by either grabbing behind your thighs or at your knees. Pull both knees toward your chest until you feel a gentle stretch in your low back. Hold 30 seconds. Tense your stomach muscles and slowly return one leg at a time to the floor. Repeat 2 times. Complete this exercise 3 times per week.   STRETCH - Low Trunk Rotation Lie on a firm bed or floor. Keeping your legs in front of you, bend your knees so they are both pointed toward the ceiling and your feet are flat on the floor. Extend your arms out to the side. This will stabilize your upper body by keeping your shoulders in contact with the floor. Gently and slowly drop both knees together to  one side until you feel a gentle stretch in your low back. Hold for 30 seconds. Tense your stomach muscles to support your lower back as you bring your knees back to the starting position. Repeat the exercise to the other side. Repeat 2 times. Complete this exercise at least 3 times per week.   EXTENSION RANGE OF MOTION AND FLEXIBILITY EXERCISES:  STRETCH - Extension, Prone on Elbows  Lie on your stomach on the floor, a bed will be too soft. Place your palms about shoulder width apart and at the  height of your head. Place your elbows under your shoulders. If this is too painful, stack pillows under your chest. Allow your body to relax so that your hips drop lower and make contact more completely with the floor. Hold this position for 30 seconds. Slowly return to lying flat on the floor. Repeat 2 times. Complete this exercise 3 times per week.   RANGE OF MOTION - Extension, Prone Press Ups Lie on your stomach on the floor, a bed will be too soft. Place your palms about shoulder width apart and at the height of your head. Keeping your back as relaxed as possible, slowly straighten your elbows while keeping your hips on the floor. You may adjust the placement of your hands to maximize your comfort. As you gain motion, your hands will come more underneath your shoulders. Hold this position 30 seconds. Slowly return to lying flat on the floor. Repeat 2 times. Complete this exercise 3 times per week.   RANGE OF MOTION- Quadruped, Neutral Spine  Assume a hands and knees position on a firm surface. Keep your hands under your shoulders and your knees under your hips. You may place padding under your knees for comfort. Drop your head and point your tailbone toward the ground below you. This will round out your lower back like an angry cat. Hold this position for 30 seconds. Slowly lift your head and release your tail bone so that your back sags into a large arch, like an old horse. Hold this position for 30 seconds. Repeat this until you feel limber in your low back. Now, find your "sweet spot." This will be the most comfortable position somewhere between the two previous positions. This is your neutral spine. Once you have found this position, tense your stomach muscles to support your low back. Hold this position for 30 seconds. Repeat 2 times. Complete this exercise 3 times per week.   STRENGTHENING EXERCISES - Low Back Sprain These exercises may help you when beginning to rehabilitate  your injury. These exercises should be done near your "sweet spot." This is the neutral, low-back arch, somewhere between fully rounded and fully arched, that is your least painful position. When performed in this safe range of motion, these exercises can be used for people who have either a flexion or extension based injury. These exercises may resolve your symptoms with or without further involvement from your physician, physical therapist or athletic trainer. While completing these exercises, remember:  Muscles can gain both the endurance and the strength needed for everyday activities through controlled exercises. Complete these exercises as instructed by your physician, physical therapist or athletic trainer. Increase the resistance and repetitions only as guided. You may experience muscle soreness or fatigue, but the pain or discomfort you are trying to eliminate should never worsen during these exercises. If this pain does worsen, stop and make certain you are following the directions exactly. If the pain is still present after  adjustments, discontinue the exercise until you can discuss the trouble with your caregiver.  STRENGTHENING - Deep Abdominals, Pelvic Tilt  Lie on a firm bed or floor. Keeping your legs in front of you, bend your knees so they are both pointed toward the ceiling and your feet are flat on the floor. Tense your lower abdominal muscles to press your low back into the floor. This motion will rotate your pelvis so that your tail bone is scooping upwards rather than pointing at your feet or into the floor. With a gentle tension and even breathing, hold this position for 3 seconds. Repeat 2 times. Complete this exercise 3 times per week.   STRENGTHENING - Abdominals, Crunches  Lie on a firm bed or floor. Keeping your legs in front of you, bend your knees so they are both pointed toward the ceiling and your feet are flat on the floor. Cross your arms over your chest. Slightly tip  your chin down without bending your neck. Tense your abdominals and slowly lift your trunk high enough to just clear your shoulder blades. Lifting higher can put excessive stress on the lower back and does not further strengthen your abdominal muscles. Control your return to the starting position. Repeat 2 times. Complete this exercise 3 times per week.   STRENGTHENING - Quadruped, Opposite UE/LE Lift  Assume a hands and knees position on a firm surface. Keep your hands under your shoulders and your knees under your hips. You may place padding under your knees for comfort. Find your neutral spine and gently tense your abdominal muscles so that you can maintain this position. Your shoulders and hips should form a rectangle that is parallel with the floor and is not twisted. Keeping your trunk steady, lift your right hand no higher than your shoulder and then your left leg no higher than your hip. Make sure you are not holding your breath. Hold this position for 30 seconds. Continuing to keep your abdominal muscles tense and your back steady, slowly return to your starting position. Repeat with the opposite arm and leg. Repeat 2 times. Complete this exercise 3 times per week.   STRENGTHENING - Abdominals and Quadriceps, Straight Leg Raise  Lie on a firm bed or floor with both legs extended in front of you. Keeping one leg in contact with the floor, bend the other knee so that your foot can rest flat on the floor. Find your neutral spine, and tense your abdominal muscles to maintain your spinal position throughout the exercise. Slowly lift your straight leg off the floor about 6 inches for a count of 3, making sure to not hold your breath. Still keeping your neutral spine, slowly lower your leg all the way to the floor. Repeat this exercise with each leg 2 times. Complete this exercise 3 times per week.  POSTURE AND BODY MECHANICS CONSIDERATIONS - Low Back Sprain Keeping correct posture when  sitting, standing or completing your activities will reduce the stress put on different body tissues, allowing injured tissues a chance to heal and limiting painful experiences. The following are general guidelines for improved posture.  While reading these guidelines, remember: The exercises prescribed by your provider will help you have the flexibility and strength to maintain correct postures. The correct posture provides the best environment for your joints to work. All of your joints have less wear and tear when properly supported by a spine with good posture. This means you will experience a healthier, less painful body. Correct posture  must be practiced with all of your activities, especially prolonged sitting and standing. Correct posture is as important when doing repetitive low-stress activities (typing) as it is when doing a single heavy-load activity (lifting).  RESTING POSITIONS Consider which positions are most painful for you when choosing a resting position. If you have pain with flexion-based activities (sitting, bending, stooping, squatting), choose a position that allows you to rest in a less flexed posture. You would want to avoid curling into a fetal position on your side. If your pain worsens with extension-based activities (prolonged standing, working overhead), avoid resting in an extended position such as sleeping on your stomach. Most people will find more comfort when they rest with their spine in a more neutral position, neither too rounded nor too arched. Lying on a non-sagging bed on your side with a pillow between your knees, or on your back with a pillow under your knees will often provide some relief. Keep in mind, being in any one position for a prolonged period of time, no matter how correct your posture, can still lead to stiffness.  PROPER SITTING POSTURE In order to minimize stress and discomfort on your spine, you must sit with correct posture. Sitting with good  posture should be effortless for a healthy body. Returning to good posture is a gradual process. Many people can work toward this most comfortably by using various supports until they have the flexibility and strength to maintain this posture on their own. When sitting with proper posture, your ears will fall over your shoulders and your shoulders will fall over your hips. You should use the back of the chair to support your upper back. Your lower back will be in a neutral position, just slightly arched. You may place a small pillow or folded towel at the base of your lower back for  support.  When working at a desk, create an environment that supports good, upright posture. Without extra support, muscles tire, which leads to excessive strain on joints and other tissues. Keep these recommendations in mind:  CHAIR: A chair should be able to slide under your desk when your back makes contact with the back of the chair. This allows you to work closely. The chair's height should allow your eyes to be level with the upper part of your monitor and your hands to be slightly lower than your elbows.  BODY POSITION Your feet should make contact with the floor. If this is not possible, use a foot rest. Keep your ears over your shoulders. This will reduce stress on your neck and low back.  INCORRECT SITTING POSTURES  If you are feeling tired and unable to assume a healthy sitting posture, do not slouch or slump. This puts excessive strain on your back tissues, causing more damage and pain. Healthier options include: Using more support, like a lumbar pillow. Switching tasks to something that requires you to be upright or walking. Talking a brief walk. Lying down to rest in a neutral-spine position.  PROLONGED STANDING WHILE SLIGHTLY LEANING FORWARD  When completing a task that requires you to lean forward while standing in one place for a long time, place either foot up on a stationary 2-4 inch high object  to help maintain the best posture. When both feet are on the ground, the lower back tends to lose its slight inward curve. If this curve flattens (or becomes too large), then the back and your other joints will experience too much stress, tire more quickly, and can  cause pain.  CORRECT STANDING POSTURES Proper standing posture should be assumed with all daily activities, even if they only take a few moments, like when brushing your teeth. As in sitting, your ears should fall over your shoulders and your shoulders should fall over your hips. You should keep a slight tension in your abdominal muscles to brace your spine. Your tailbone should point down to the ground, not behind your body, resulting in an over-extended swayback posture.   INCORRECT STANDING POSTURES  Common incorrect standing postures include a forward head, locked knees and/or an excessive swayback. WALKING Walk with an upright posture. Your ears, shoulders and hips should all line-up.  PROLONGED ACTIVITY IN A FLEXED POSITION When completing a task that requires you to bend forward at your waist or lean over a low surface, try to find a way to stabilize 3 out of 4 of your limbs. You can place a hand or elbow on your thigh or rest a knee on the surface you are reaching across. This will provide you more stability, so that your muscles do not tire as quickly. By keeping your knees relaxed, or slightly bent, you will also reduce stress across your lower back. CORRECT LIFTING TECHNIQUES  DO : Assume a wide stance. This will provide you more stability and the opportunity to get as close as possible to the object which you are lifting. Tense your abdominals to brace your spine. Bend at the knees and hips. Keeping your back locked in a neutral-spine position, lift using your leg muscles. Lift with your legs, keeping your back straight. Test the weight of unknown objects before attempting to lift them. Try to keep your elbows locked down at  your sides in order get the best strength from your shoulders when carrying an object.   Always ask for help when lifting heavy or awkward objects. INCORRECT LIFTING TECHNIQUES DO NOT:  Lock your knees when lifting, even if it is a small object. Bend and twist. Pivot at your feet or move your feet when needing to change directions. Assume that you can safely pick up even a paperclip without proper posture.

## 2021-04-13 ENCOUNTER — Other Ambulatory Visit: Payer: Self-pay | Admitting: Family Medicine

## 2021-04-17 ENCOUNTER — Other Ambulatory Visit (INDEPENDENT_AMBULATORY_CARE_PROVIDER_SITE_OTHER): Payer: No Typology Code available for payment source

## 2021-04-17 ENCOUNTER — Other Ambulatory Visit: Payer: Self-pay

## 2021-04-17 DIAGNOSIS — E876 Hypokalemia: Secondary | ICD-10-CM

## 2021-04-17 LAB — BASIC METABOLIC PANEL
BUN: 10 mg/dL (ref 6–23)
CO2: 26 mEq/L (ref 19–32)
Calcium: 9.8 mg/dL (ref 8.4–10.5)
Chloride: 100 mEq/L (ref 96–112)
Creatinine, Ser: 0.73 mg/dL (ref 0.40–1.20)
GFR: 88.21 mL/min (ref >60.00)
Glucose, Bld: 102 mg/dL — ABNORMAL HIGH (ref 70–99)
Potassium: 3.8 mEq/L (ref 3.5–5.1)
Sodium: 137 mEq/L (ref 135–145)

## 2021-04-19 LAB — 5-HIAA, PLASMA: 5-HIAA, Plasma: 12 ng/mL

## 2021-05-10 ENCOUNTER — Other Ambulatory Visit: Payer: Self-pay | Admitting: Physician Assistant

## 2021-05-10 DIAGNOSIS — E782 Mixed hyperlipidemia: Secondary | ICD-10-CM

## 2021-05-11 ENCOUNTER — Encounter: Payer: Self-pay | Admitting: Family Medicine

## 2021-05-11 ENCOUNTER — Ambulatory Visit (INDEPENDENT_AMBULATORY_CARE_PROVIDER_SITE_OTHER): Payer: No Typology Code available for payment source | Admitting: Family Medicine

## 2021-05-11 ENCOUNTER — Other Ambulatory Visit: Payer: Self-pay

## 2021-05-11 VITALS — BP 120/68 | HR 75 | Temp 97.5°F | Ht 65.0 in | Wt 161.5 lb

## 2021-05-11 DIAGNOSIS — R002 Palpitations: Secondary | ICD-10-CM

## 2021-05-11 DIAGNOSIS — M1A9XX0 Chronic gout, unspecified, without tophus (tophi): Secondary | ICD-10-CM | POA: Diagnosis not present

## 2021-05-11 LAB — BASIC METABOLIC PANEL
BUN: 16 mg/dL (ref 6–23)
CO2: 25 mEq/L (ref 19–32)
Calcium: 10.4 mg/dL (ref 8.4–10.5)
Chloride: 102 mEq/L (ref 96–112)
Creatinine, Ser: 0.67 mg/dL (ref 0.40–1.20)
GFR: 93.7 mL/min (ref >60.00)
Glucose, Bld: 106 mg/dL — ABNORMAL HIGH (ref 70–99)
Potassium: 3.8 mEq/L (ref 3.5–5.1)
Sodium: 139 mEq/L (ref 135–145)

## 2021-05-11 LAB — MAGNESIUM: Magnesium: 2.1 mg/dL (ref 1.5–2.5)

## 2021-05-11 LAB — URIC ACID: Uric Acid, Serum: 6.3 mg/dL (ref 2.4–7.0)

## 2021-05-11 NOTE — Patient Instructions (Addendum)
Give [place] 2-3 business days to get the results of your labs back. Our follow up will be based on the results.   Stay hydrated.  Let us know if you need anything.

## 2021-05-11 NOTE — Progress Notes (Signed)
Chief Complaint  Patient presents with   Gout    Alexandria Bradley is a 60 y.o. female here for gout.  Currently being treated with allopurinol 100 mg/d. The joint(s) affected include: L MTP Most recent uric acid level is: 9.2 Reports compliance. Still having intermittent pains in affected jt but improved.  Side effects of medications: None Is avoiding seafood, sweet/sugary beverages, alcohol, and red meats.  Past Medical History:  Diagnosis Date   Anxiety    Arthritis    Back pain    Breast cancer ([place])    left   Fatty liver    GERD (gastroesophageal reflux disease)    Headache(784.0)    PMH : migraines   History of kidney stones    Hypercholesterolemia    Hypertension    Hypothyroidism    Osteoporosis    Personal history of chemotherapy    Personal history of radiation therapy    PONV (postoperative nausea and vomiting)    difficult to wake up   Pre-diabetes    Psoriasis    PVC's (premature ventricular contractions)    Radiation 08/18/14-10/07/14   Left breast   Sleep apnea    no CPAP    BP 120/68   Pulse 75   Temp (!) 97.5 F (36.4 C) (Oral)   Ht '5\' 5"'$  (1.651 m)   Wt 161 lb 8 oz (73.3 kg)   SpO2 97%   BMI 26.88 kg/m  Gen: Awake, alert, appears stated age Neck: No masses or asymmetry Heart: RRR, no murmurs, no bruits, no LE edema Lungs: CTAB, no accessory muscle use MSK: no swelling or TTP, normal gait Skin: No erythema or warmth Psych: Age appropriate judgment and insight, nml mood and affect  Chronic gout involving toe of left foot without tophus, unspecified cause - Plan: Basic metabolic panel, Uric acid  Palpitations - Plan: Magnesium  Chronic, unstable. Ck labs, may need to increase dosage of allopurinol 100 mg/d. Cont for now.  Reminded to avoid foods like alcohol, sweet beverages, red meat, lunch meat, sea food. She follows w cardiology but has a hx of low K.  F/u pending above. The patient voiced understanding and agreement to the  plan.  [place], DO 05/11/21 10:45 AM

## 2021-05-15 ENCOUNTER — Other Ambulatory Visit: Payer: No Typology Code available for payment source

## 2021-06-12 ENCOUNTER — Other Ambulatory Visit: Payer: Self-pay | Admitting: Gastroenterology

## 2021-08-05 NOTE — Progress Notes (Signed)
[place]  [phone] [phone]    ID: [name] OB: 1961-05-24  MR#: [id number]  EVO#:[id number]  Patient Care Team: [name], DO as PCP - General (Family Medicine) Josue Hector, MD as PCP - Cardiology (Cardiology) Keyani Rigdon, Virgie Dad, MD as Consulting Physician (Oncology) Brien Few, MD as Consulting Physician (Obstetrics and Gynecology) [name], MD as Consulting Physician (General Surgery) Delrae Rend, MD as Consulting Physician (Endocrinology) Dorna Leitz, MD as Consulting Physician (Orthopedic Surgery) Josue Hector, MD as Consulting Physician (Cardiology) Irene Limbo, MD as Consulting Physician (Plastic Surgery) Ladene Artist, MD as Consulting Physician (Gastroenterology) OTHER MD:    CHIEF COMPLAINT: Triple negative breast cancer, locally recurrent (s/p bilateral mastectomies)  CURRENT TREATMENT: Observation   INTERVAL HISTORY: Alexandria Bradley returns today for follow-up of her locally recurrent triple negative breast cancer. She continues under observation.  She is concerned that she has noted some changes in her right breast laterally that she wanted [place] to look at.  Recall she had a chest CT scan 04/03/2021 which showed no evidence of disease.     REVIEW OF SYSTEMS: Peightyn had a severe case of gout over the summer and finally controlled on allopurinol but now she is having a lot of symptoms related to the allopurinol.  She is finally starting an exercise program and [facility] and the other 3 days they do machines so [facility] are [facility] the [facility] 5 days a [facility] which is really terrific.  She continues to have neuropathy particularly involving the feet so she cannot use a treadmill comfortably.  She is very reluctant to try gabapentin and in fact she has taken herself off a variety of medications because she is on "too many drugs".  She is having  some hair loss which she also blames on the allopurinol.  A detailed review of systems today was otherwise stable   COVID 19 VACCINATION STATUS: Datil x2, had COVID January 2022   BREAST CANCER HISTORY: From the original intake note of 02/16/2014:  [name] palpated a mass in her left breast early May and brought it immediately to her gynecologist attention. He set her up for bilateral diagnostic mammography and left ultrasonography at [facility] 02/07/2014. Mammography showed a 1.3 cm oval mass with spiculated margins in the left breast at the 11:00 position. This was palpable. Ultrasound showed a 1.1 cm tall her than wide mass with a microlobulated margins. He was hypoechoic. There were no abnormalities noted in the left axilla.  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 Lyndon Code at conference 02/16/2014), triple negative, with an MIB-1 of 50%.  The patient's subsequent history is as detailed below   PAST MEDICAL HISTORY: Past Medical History:  Diagnosis Date   Anxiety    Arthritis    Back pain    Breast cancer ([place])    left   Fatty liver    GERD (gastroesophageal reflux disease)    Headache(784.0)    PMH : migraines   History of kidney stones    Hypercholesterolemia    Hypertension    Hypothyroidism    Osteoporosis    Personal history of chemotherapy    Personal history of radiation therapy    PONV (postoperative nausea and vomiting)    difficult to wake up   Pre-diabetes  Psoriasis    PVC's (premature ventricular contractions)    Radiation 08/18/14-10/07/14   Left breast   Sleep apnea    no CPAP    PAST SURGICAL HISTORY: Past Surgical History:  Procedure Laterality Date   BREAST BIOPSY Left 11/10/2018   BREAST LUMPECTOMY Left 2015   BREAST LUMPECTOMY WITH AXILLARY LYMPH NODE BIOPSY  6/15   left   BREAST RECONSTRUCTION WITH PLACEMENT OF TISSUE  EXPANDER AND ALLODERM Bilateral 04/12/2019   Procedure: BILATERAL BREAST RECONSTRUCTION WITH PLACEMENT OF TISSUE EXPANDERS; ALLODERM TO RIGHT CHEST;  Surgeon: Irene Limbo, MD;  Location: [place];  Service: Plastics;  Laterality: Bilateral;   BREAST RECONSTRUCTION WITH PLACEMENT OF TISSUE EXPANDER AND ALLODERM Right 01/24/2020   Procedure: BREAST RECONSTRUCTION WITH PLACEMENT OF TISSUE EXPANDER AND ALLODERM;  Surgeon: Irene Limbo, MD;  Location: [place];  Service: Plastics;  Laterality: Right;   CARDIAC CATHETERIZATION     CHOLECYSTECTOMY     colon polyps     COLONOSCOPY N/A 06/27/2014   Procedure: COLONOSCOPY;  Surgeon: Lear Ng, MD;  Location: [place];  Service: Endoscopy;  Laterality: N/A;   LATISSIMUS FLAP TO BREAST Left 04/12/2019   Procedure: LEFT LATISSIMUS DORSI FLAP TO LEFT CHEST;  Surgeon: Irene Limbo, MD;  Location: [place];  Service: Plastics;  Laterality: Left;   LEFT HEART CATH AND CORONARY ANGIOGRAPHY N/A 07/24/2018   Procedure: LEFT HEART CATH AND CORONARY ANGIOGRAPHY;  Surgeon: Troy Sine, MD;  Location: [place] [facility];  Service: Cardiovascular;  Laterality: N/A;   LIPOMA EXCISION     LIPOSUCTION WITH LIPOFILLING Bilateral 06/12/2020   Procedure: LIPOFILLING TO BILATERAL CHEST;  Surgeon: Irene Limbo, MD;  Location: [place];  Service: Plastics;  Laterality: Bilateral;   MASTECTOMY W/ SENTINEL NODE BIOPSY Bilateral 04/12/2019   Procedure: RIGHT BREAST RISK REDUCING MASTECTOMY; LEFT MASTECTOMY WITH LEFT AXILLARY SENTINEL LYMPH NODE BIOPSY WITH BLUE DYE INJECTION;  Surgeon: [name], MD;  Location: [place];  Service: General;  Laterality: Bilateral;   MYOMECTOMY     PORT-A-CATH REMOVAL N/A 02/03/2015   Procedure: REMOVAL PORT-A-CATH;  Surgeon: [name], MD;  Location: [place];  Service: General;  Laterality: N/A;   PORT-A-CATH REMOVAL Right 01/24/2020   Procedure: REMOVAL  PORT-A-CATH;  Surgeon: Irene Limbo, MD;  Location: [place];  Service: Plastics;  Laterality: Right;   PORTACATH PLACEMENT N/A 03/01/2014   Procedure: INSERTION PORT-A-CATH;  Surgeon: [name], MD;  Location: [place];  Service: General;  Laterality: N/A;   PORTACATH PLACEMENT N/A 11/18/2018   Procedure: INSERTION PORT-A-CATH WITH ULTRASOUND;  Surgeon: [name], MD;  Location: [place];  Service: General;  Laterality: N/A;   REMOVAL OF BILATERAL TISSUE EXPANDERS WITH PLACEMENT OF BILATERAL BREAST IMPLANTS Bilateral 06/12/2020   Procedure: REMOVAL OF BILATERAL TISSUE EXPANDERS WITH PLACEMENT OF BILATERAL BREAST IMPLANTS;  Surgeon: Irene Limbo, MD;  Location: [place];  Service: Plastics;  Laterality: Bilateral;   REMOVAL OF TISSUE EXPANDER AND PLACEMENT OF IMPLANT Right 06/02/2019   Procedure: REMOVAL OF TISSUE EXPANDER RIGHT CHEST;  Surgeon: Irene Limbo, MD;  Location: [place];  Service: Plastics;  Laterality: Right;   TONSILLECTOMY     TUBAL LIGATION     WOUND DEBRIDEMENT Left 06/02/2019   Procedure: LEFT CHEST DEBRIDEMENT;  Surgeon: Irene Limbo, MD;  Location: [place];  Service: Plastics;  Laterality: Left;    FAMILY HISTORY: Family History  Problem Relation Age of Onset  Endometrial cancer Mother 34   Hearing loss Mother    Atrial fibrillation Mother    Lung cancer Father    Brain cancer Father    Heart disease Maternal Aunt    Atrial fibrillation Maternal Aunt    Lung cancer Maternal Uncle    Atrial fibrillation Maternal Uncle    Stroke Maternal Grandmother    Stroke Maternal Grandfather    Bladder Cancer Maternal Uncle    Multiple myeloma Maternal Uncle    Other Son        trisomy 13   Colon polyps Cousin    Colon cancer Neg Hx    Esophageal cancer Neg Hx    Rectal cancer Neg Hx    Stomach cancer Neg Hx   The patient's father died at the age of 53 from  what may have been metastatic lung cancer. The patient knows very little about the father's side of her family. Her mother is alive at age 62. She was recently diagnosed with endometrial cancer. The patient has one brother, 2 sisters. There is no history of breast or ovarian cancer in the family.   GYNECOLOGIC HISTORY:  Menarche age 56, first live birth age 12. The patient is GX P3. One child died shortly after birth. She stopped having periods approximately 2005. She did not use hormone replacement. 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 Son [name] is  Nurse, adult in [place]. Daughter [name] lives in [place] and works for [employer] as an Therapist, sports.  The patient has three grandchildren.    ADVANCED DIRECTIVES: Not in place   HEALTH MAINTENANCE: Social History   Tobacco Use   Smoking status: Former    Packs/day: 0.50    Years: 15.00    Pack years: 7.50    Types: Cigarettes   Smokeless tobacco: Never   Tobacco comments:    Quit smoking cigarettes in 2002  Vaping Use   Vaping Use: Never used  Substance Use Topics   Alcohol use: Not Currently   Drug use: No     Colonoscopy: 06/27/2014, normal, [name]  PAP: 2013  Bone density: 04/10/2015, -1.1 ([facility])  Lipid panel:  Allergies  Allergen Reactions   Chlorhexidine Itching   Ciprofloxacin Anxiety, Rash and Other (See Comments)    GI upset   Prednisone Shortness Of Breath and Other (See Comments)    "jacks me up" jittery    Codeine Nausea Only   Cortisone Swelling and Other (See Comments)    "jack me up"     Current Outpatient Medications  Medication Sig Dispense Refill   allopurinol (ZYLOPRIM) 100 MG tablet Take 1 tablet (100 mg total)  by mouth daily. 30 tablet 6   augmented betamethasone dipropionate (DIPROLENE-AF) 0.05 % cream Apply 1 application topically 2 (two) times daily as needed.     diltiazem (CARDIZEM CD) 240 MG 24 hr capsule TAKE 1 CAPSULE BY MOUTH EVERY DAY 90 capsule 3   esomeprazole (NEXIUM) 40 MG capsule Take 1 capsule (40 mg total) by mouth daily at 12 noon. 30 capsule 11   liothyronine (CYTOMEL) 5 MCG tablet Take 5 mcg by mouth daily before breakfast.      ondansetron (ZOFRAN-ODT) 4 MG disintegrating tablet Take 1 tablet (4 mg total) by  mouth every 8 (eight) hours as needed for nausea or vomiting. 20 tablet 0   potassium chloride (KLOR-CON 10) 10 MEQ tablet Take 2 tablets (20 mEq total) by mouth daily for 5 days. 10 tablet 0   rosuvastatin (CRESTOR) 5 MG tablet TAKE 1 TABLET (5 MG TOTAL) BY MOUTH DAILY. 90 tablet 0   Saccharomyces boulardii (PROBIOTIC) 250 MG CAPS Take 1 tablet by mouth every other day. 60 capsule    TIROSINT 137 MCG CAPS Take 1 capsule by mouth every morning.     triamterene-hydrochlorothiazide (MAXZIDE-25) 37.5-25 MG tablet Take 1 tablet by mouth as needed. 90 tablet 0   No current facility-administered medications for this visit.    OBJECTIVE: White woman who appears well  Vitals:   08/06/21 0908  BP: 138/73  Pulse: 64  Resp: 16  Temp: 97.9 F (36.6 C)  SpO2: 98%   Wt Readings from Last 3 Encounters:  08/06/21 165 lb 14.4 oz (75.3 kg)  05/11/21 161 lb 8 oz (73.3 kg)  04/10/21 160 lb (72.6 kg)   Body mass index is 27.61 kg/m.    ECOG FS:1 - Symptomatic but completely ambulatory  Sclerae unicteric, EOMs intact Wearing a mask No cervical or supraclavicular adenopathy Lungs no rales or rhonchi Heart regular rate and rhythm Abd soft, nontender, positive bowel sounds MSK no focal spinal tenderness, no upper extremity lymphedema Neuro: nonfocal, well oriented, appropriate affect Breasts: Status post bilateral mastectomies with bilateral implant reconstruction.  The "bumps"  as she feels in the lateral aspect of the right breast are really slight wrinkles in the implant.  They are longitudinal and not lumpy and they are soft.  This was very reassuring.  There is no evidence of local recurrence.   LAB RESULTS: No results found for: SPEP  Lab Results  Component Value Date   WBC 7.6 08/06/2021   NEUTROABS 4.2 08/06/2021   HGB 13.6 08/06/2021   HCT 40.2 08/06/2021   MCV 90.3 08/06/2021   PLT 291 08/06/2021      Chemistry      Component Value Date/Time   NA 140 08/06/2021 0853   NA 138 01/09/2021 0855   NA 140 06/19/2017 1312   K 3.6 08/06/2021 0853   K 3.5 06/19/2017 1312   CL 104 08/06/2021 0853   CO2 23 08/06/2021 0853   CO2 25 06/19/2017 1312   BUN 17 08/06/2021 0853   BUN 13 01/09/2021 0855   BUN 18.4 06/19/2017 1312   CREATININE 0.78 08/06/2021 0853   CREATININE 0.79 06/09/2019 1326   CREATININE 0.8 06/19/2017 1312      Component Value Date/Time   CALCIUM 10.2 08/06/2021 0853   CALCIUM 10.2 06/19/2017 1312   ALKPHOS 104 08/06/2021 0853   ALKPHOS 86 06/19/2017 1312   AST 18 08/06/2021 0853   AST 69 (H) 06/09/2019 1326   AST 15 06/19/2017 1312   ALT 25 08/06/2021 0853   ALT 40 06/09/2019 1326   ALT 22 06/19/2017 1312   BILITOT 0.5 08/06/2021 0853   BILITOT 0.4 01/09/2021 0855   BILITOT 0.3 06/09/2019 1326   BILITOT 0.26 06/19/2017 1312      No results found for: LABCA2  No components found for: LABCA125  No results for input(s): INR in the last 168 hours.  Urinalysis    Component Value Date/Time   COLORURINE STRAW (A) 03/12/2020 1425   APPEARANCEUR CLEAR 03/12/2020 1425   LABSPEC 1.010 03/12/2020 1425   LABSPEC 1.020 06/15/2014 1101   PHURINE 6.5 03/12/2020 1425  GLUCOSEU NEGATIVE 03/12/2020 1425   GLUCOSEU Negative 06/15/2014 1101   HGBUR NEGATIVE 03/12/2020 1425   BILIRUBINUR NEGATIVE 03/12/2020 1425   BILIRUBINUR Color Interference 06/15/2014 1101   KETONESUR NEGATIVE 03/12/2020 1425   PROTEINUR NEGATIVE  03/12/2020 1425   UROBILINOGEN 0.2 06/15/2014 1101   NITRITE NEGATIVE 03/12/2020 1425   LEUKOCYTESUR NEGATIVE 03/12/2020 1425   LEUKOCYTESUR Trace 06/15/2014 1101    STUDIES: No results found.   ASSESSMENT: 60 y.o. [place] woman status post left breast upper inner quadrant biopsy 02/08/2014 for a clinical T1c N0, stage IA invasive ductal carcinoma, grade 3, triple negative, with an MIB-1 of 50%.  (1) status post left lumpectomy and sentinel lymph node sampling 03/01/2014 for a pT1c pN0, stage IA invasive ductal carcinoma, grade 3, with negative margins, repeat prognostic panel again triple negative.  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  Left breast with breath hold technique/ 45 Gy at 1.8 Gy per fraction x 25 fractions.    Left breast boost/ 16 Gy at 2 Gy per fraction x 8 fractions  (4) genetics counseling 07/06/2015 through the Breast/Ovarian cancer gene panel offered by [facility] found no deleterious mutations in ATM, BARD1, BRCA1, BRCA2, BRIP1, CDH1, CHEK2, EPCAM, FANCC, MLH1, MSH2, MSH6, NBN, PALB2, PMS2, PTEN, RAD51C, RAD51D, TP53, and XRCC2.    (5) question of sleep apnea  (6) transverse colon lesion noted on CT scan from 06/17/2014 - negative biopsy, also showed fatty liver  (a) repeat CT scan of the abdomen and pelvis with contrast 02/15/2016 negative  RECURRENT DISEASE: February 2020 (7) biopsy of palpable mass upper inner left breast 11/10/2018 confirms invasive ductal carcinoma, grade 2 or 3, triple negative, with an MIB-1 of  80%.  (a) CT scans of the chest abdomen and pelvis with contrast showed no stage IV disease  (b) bone scan 11/13/2018 shows no definite metastasis to bone  (8) neoadjuvant chemotherapy consisting of carboplatin and gemcitabine given days 1 and 8 of each 21-day cycle, for 6 cycles, started 11/26/2018, completed 03/18/2019  (a) repeat ultrasound 01/25/2019 (after 3 cycles) shows no change in measurable breast mass   (9) bilateral mastectomies as follows   (a) left mastectomy and sentinel lymph node sampling with immediate tissue expander placement and latissimus flap 04/12/2019 showed a residual ypT1a ypN0 invasive ductal carcinoma, grade 3, with negative margins; a single sentinel lymph node was removed.  Margins were negative  (b) right mastectomy with immediate implant placement showed no malignancy  (c) right expander removed 06/02/2019 secondary to infectious complications  (10) molecular testing on the 11/10/2018 biopsy showed negative PD-L1, negative androgen receptor, stable MSI and proficient mismatch repair status.  The tumor mutational burden was low.  No PIK3 mutation was detected.  There was a pathogenic TP53 variant and negative ER PR and HER-2 were confirmed.  (11) Adjuvant pembrolizumab given every 3 weeks beginning 06/09/2019, discontinued 09/22/2019 with rash  (12) Adjuvant capecitabine 1500mg  PO BID for 14 days on and 7 days off on a 21 day cycle starting 06/30/2019, discontinued January/2021 due to diarrhea   PLAN: Kimala is now a little more than 2 years out from definitive surgery  for her breast cancer with no evidence of disease recurrence.  This is very favorable.  She continues to experience a variety of symptoms which she blames on allopurinol.  Some of it may be due to allopurinol but perhaps not all.  Possibly she could go on a break after discussing this with her primary care physician and if she really does feel significantly better off the allopurinol perhaps there  are some other options although she tells me she could not tolerate the indomethacin and colchicine either.  She already has a survivorship appointment in 6 weeks.  As she will return to see [place] in 1 year.  She knows to call for any other issue that may develop before then  Total encounter time 25 minutes.Sarajane Jews C. Dontreal Miera, MD 08/06/21 9:46 AM Medical Oncology and Hematology [facility] [place], [place] [address] Tel. [phone]    Fax. [phone]   I, Wilburn Mylar, am acting as scribe for Dr. Virgie Dad. Aksel Bencomo.  I, Lurline Del MD, have reviewed the above documentation for accuracy and completeness, and I agree with the above.    *Total Encounter Time as defined by the Centers for Medicare and Medicaid Services includes, in addition to the face-to-face time of a patient visit (documented in the note above) non-face-to-face time: obtaining and reviewing outside history, ordering and reviewing medications, tests or procedures, care coordination (communications with other health care professionals or caregivers) and documentation in the medical record.

## 2021-08-06 ENCOUNTER — Other Ambulatory Visit: Payer: Self-pay

## 2021-08-06 ENCOUNTER — Inpatient Hospital Stay: Payer: No Typology Code available for payment source

## 2021-08-06 ENCOUNTER — Inpatient Hospital Stay: Payer: No Typology Code available for payment source | Attending: Oncology | Admitting: Oncology

## 2021-08-06 VITALS — BP 138/73 | HR 64 | Temp 97.9°F | Resp 16 | Ht 65.0 in | Wt 165.9 lb

## 2021-08-06 DIAGNOSIS — Z171 Estrogen receptor negative status [ER-]: Secondary | ICD-10-CM

## 2021-08-06 DIAGNOSIS — Z9013 Acquired absence of bilateral breasts and nipples: Secondary | ICD-10-CM | POA: Diagnosis not present

## 2021-08-06 DIAGNOSIS — C50212 Malignant neoplasm of upper-inner quadrant of left female breast: Secondary | ICD-10-CM | POA: Insufficient documentation

## 2021-08-06 DIAGNOSIS — C50912 Malignant neoplasm of unspecified site of left female breast: Secondary | ICD-10-CM

## 2021-08-06 DIAGNOSIS — Z923 Personal history of irradiation: Secondary | ICD-10-CM | POA: Insufficient documentation

## 2021-08-06 DIAGNOSIS — Z79899 Other long term (current) drug therapy: Secondary | ICD-10-CM | POA: Insufficient documentation

## 2021-08-06 LAB — CBC WITH DIFFERENTIAL/PLATELET
Abs Immature Granulocytes: 0.02 10*3/uL (ref 0.00–0.07)
Basophils Absolute: 0.1 10*3/uL (ref 0.0–0.1)
Basophils Relative: 1 %
Eosinophils Absolute: 0.3 10*3/uL (ref 0.0–0.5)
Eosinophils Relative: 4 %
HCT: 40.2 % (ref 36.0–46.0)
Hemoglobin: 13.6 g/dL (ref 12.0–15.0)
Immature Granulocytes: 0 %
Lymphocytes Relative: 31 %
Lymphs Abs: 2.3 10*3/uL (ref 0.7–4.0)
MCH: 30.6 pg (ref 26.0–34.0)
MCHC: 33.8 g/dL (ref 30.0–36.0)
MCV: 90.3 fL (ref 80.0–100.0)
Monocytes Absolute: 0.7 10*3/uL (ref 0.1–1.0)
Monocytes Relative: 9 %
Neutro Abs: 4.2 10*3/uL (ref 1.7–7.7)
Neutrophils Relative %: 55 %
Platelets: 291 10*3/uL (ref 150–400)
RBC: 4.45 MIL/uL (ref 3.87–5.11)
RDW: 12.6 % (ref 11.5–15.5)
WBC: 7.6 10*3/uL (ref 4.0–10.5)
nRBC: 0 % (ref 0.0–0.2)

## 2021-08-06 LAB — COMPREHENSIVE METABOLIC PANEL
ALT: 25 U/L (ref 0–44)
AST: 18 U/L (ref 15–41)
Albumin: 4.4 g/dL (ref 3.5–5.0)
Alkaline Phosphatase: 104 U/L (ref 38–126)
Anion gap: 13 (ref 5–15)
BUN: 17 mg/dL (ref 8–23)
CO2: 23 mmol/L (ref 22–32)
Calcium: 10.2 mg/dL (ref 8.9–10.3)
Chloride: 104 mmol/L (ref 98–111)
Creatinine, Ser: 0.78 mg/dL (ref 0.44–1.00)
GFR, Estimated: >60 mL/min (ref >60)
Glucose, Bld: 103 mg/dL — ABNORMAL HIGH (ref 70–99)
Potassium: 3.6 mmol/L (ref 3.5–5.1)
Sodium: 140 mmol/L (ref 135–145)
Total Bilirubin: 0.5 mg/dL (ref 0.3–1.2)
Total Protein: 7.7 g/dL (ref 6.5–8.1)

## 2021-08-06 LAB — TSH: TSH: 0.135 u[IU]/mL — ABNORMAL LOW (ref 0.308–3.960)

## 2021-10-05 ENCOUNTER — Other Ambulatory Visit: Payer: Self-pay | Admitting: Family Medicine

## 2021-11-12 ENCOUNTER — Ambulatory Visit
Admission: EM | Admit: 2021-11-12 | Discharge: 2021-11-12 | Disposition: A | Discharge status: Home / Self Care | Payer: Medicare Other | Attending: Student | Admitting: Student

## 2021-11-12 ENCOUNTER — Other Ambulatory Visit: Payer: Self-pay

## 2021-11-12 ENCOUNTER — Encounter: Payer: Self-pay | Admitting: Emergency Medicine

## 2021-11-12 DIAGNOSIS — B349 Viral infection, unspecified: Secondary | ICD-10-CM | POA: Diagnosis not present

## 2021-11-12 DIAGNOSIS — J0100 Acute maxillary sinusitis, unspecified: Secondary | ICD-10-CM

## 2021-11-12 MED ORDER — AMOXICILLIN 875 MG PO TABS: 875.0000 mg | ORAL_TABLET | Freq: Two times a day (BID) | ORAL | 0 refills | Status: AC

## 2021-11-12 NOTE — ED Triage Notes (Signed)
Patient presents to [facility] for evaluation of sinus pressure, congestion and drainage since Friday.  States she has had some body aches/chills, but no fever.  No major cough, some nausea, denies vomiting or abdominal pain, some diarrhea.

## 2021-11-12 NOTE — ED Provider Notes (Signed)
[facility]    CSN: [id number] Arrival date & time: 11/12/21  1125      History   Chief Complaint Chief Complaint  Patient presents with   URI    HPI Alexandria Bradley is a 61 y.o. female presenting with congestion, sinus pressure.  Symptoms for 4-5 days.  Medical history noncontributory.  Right-sided facial pressure with radiation to teeth. Also with myalgias. Subjective chills. OTC mucinex providing some relief. Cough is nonproductive, denies SOB, CP, dizziness, weakness.  States she was actually sick about 2 months ago, the symptoms seemed to improve, but now she wonders if she did not completely recover from that illness.  HPI  Past Medical History:  Diagnosis Date   Anxiety    Arthritis    Back pain    Breast cancer ([place])    left   Fatty liver    GERD (gastroesophageal reflux disease)    Headache(784.0)    PMH : migraines   History of kidney stones    Hypercholesterolemia    Hypertension    Hypothyroidism    Osteoporosis    Personal history of chemotherapy    Personal history of radiation therapy    PONV (postoperative nausea and vomiting)    difficult to wake up   Pre-diabetes    Psoriasis    PVC's (premature ventricular contractions)    Radiation 08/18/14-10/07/14   Left breast   Sleep apnea    no CPAP    Patient Active Problem List   Diagnosis Date Noted   Chronic gout involving toe of left foot without tophus 05/11/2021   Gout attack 04/10/2021   Type 2 diabetes mellitus with complication, without long-term current use of insulin ([place]) 03/21/2021   History of therapeutic radiation 02/09/2019   Port-A-Cath in place 11/26/2018   Recurrent cancer of left breast ([place]) 11/13/2018   Neuropathy due to chemotherapeutic drug ([place]) 11/13/2018   Chest tightness    Vertigo 05/28/2018   Prediabetes 05/28/2018   OSA (obstructive sleep apnea) 08/20/2016   Snoring 08/20/2016   Adjustment disorder with mixed anxiety and depressed mood 08/20/2016    Genetic testing 07/07/2015   Family history of uterine cancer    Family history of bladder cancer    Low vitamin D level 03/13/2015   Abnormal AST and ALT 07/08/2014   Fatty infiltration of liver 07/08/2014   RUQ pain 06/27/2014   Nonspecific (abnormal) findings on radiological and other examination of gastrointestinal tract 06/27/2014   Fever 06/15/2014   Diarrhea 06/10/2014   Chemotherapy-induced nausea 06/10/2014   Abdominal pain 06/10/2014   Malignant neoplasm of upper-inner quadrant of left breast in female, estrogen receptor negative ([place]) 02/11/2014   OTHER CHEST PAIN 05/21/2010   HLD (hyperlipidemia) 05/11/2010   PSORIASIS 05/11/2010   PALPITATIONS 05/11/2010   Nonspecific (abnormal) findings on radiological and other examination of body structure 05/11/2010   CHEST XRAY, ABNORMAL 05/11/2010   Hypothyroidism 05/10/2010   EPIGASTRIC PAIN 05/10/2010   CONJUNCTIVITIS, ALLERGIC 02/18/2008   MALAISE AND FATIGUE 01/11/2008   Essential hypertension 09/02/2007   SINUSITIS- ACUTE-NOS 09/02/2007   Acute upper respiratory infection 09/02/2007   Allergic rhinitis, cause unspecified 09/02/2007    Past Surgical History:  Procedure Laterality Date   BREAST BIOPSY Left 11/10/2018   BREAST LUMPECTOMY Left 2015   BREAST LUMPECTOMY WITH AXILLARY LYMPH NODE BIOPSY  6/15   left   BREAST RECONSTRUCTION WITH PLACEMENT OF TISSUE EXPANDER AND ALLODERM Bilateral 04/12/2019   Procedure: BILATERAL BREAST RECONSTRUCTION WITH PLACEMENT OF TISSUE  EXPANDERS; ALLODERM TO RIGHT CHEST;  Surgeon: Irene Limbo, MD;  Location: [place];  Service: Plastics;  Laterality: Bilateral;   BREAST RECONSTRUCTION WITH PLACEMENT OF TISSUE EXPANDER AND ALLODERM Right 01/24/2020   Procedure: BREAST RECONSTRUCTION WITH PLACEMENT OF TISSUE EXPANDER AND ALLODERM;  Surgeon: Irene Limbo, MD;  Location: [place];  Service: Plastics;  Laterality: Right;   CARDIAC CATHETERIZATION     CHOLECYSTECTOMY      colon polyps     COLONOSCOPY N/A 06/27/2014   Procedure: COLONOSCOPY;  Surgeon: Lear Ng, MD;  Location: [place];  Service: Endoscopy;  Laterality: N/A;   LATISSIMUS FLAP TO BREAST Left 04/12/2019   Procedure: LEFT LATISSIMUS DORSI FLAP TO LEFT CHEST;  Surgeon: Irene Limbo, MD;  Location: [place];  Service: Plastics;  Laterality: Left;   LEFT HEART CATH AND CORONARY ANGIOGRAPHY N/A 07/24/2018   Procedure: LEFT HEART CATH AND CORONARY ANGIOGRAPHY;  Surgeon: Troy Sine, MD;  Location: [place] [facility];  Service: Cardiovascular;  Laterality: N/A;   LIPOMA EXCISION     LIPOSUCTION WITH LIPOFILLING Bilateral 06/12/2020   Procedure: LIPOFILLING TO BILATERAL CHEST;  Surgeon: Irene Limbo, MD;  Location: [place];  Service: Plastics;  Laterality: Bilateral;   MASTECTOMY W/ SENTINEL NODE BIOPSY Bilateral 04/12/2019   Procedure: RIGHT BREAST RISK REDUCING MASTECTOMY; LEFT MASTECTOMY WITH LEFT AXILLARY SENTINEL LYMPH NODE BIOPSY WITH BLUE DYE INJECTION;  Surgeon: [name], MD;  Location: [facility];  Service: General;  Laterality: Bilateral;   MYOMECTOMY     PORT-A-CATH REMOVAL N/A 02/03/2015   Procedure: REMOVAL PORT-A-CATH;  Surgeon: [name], MD;  Location: [place];  Service: General;  Laterality: N/A;   PORT-A-CATH REMOVAL Right 01/24/2020   Procedure: REMOVAL PORT-A-CATH;  Surgeon: Irene Limbo, MD;  Location: [place];  Service: Plastics;  Laterality: Right;   PORTACATH PLACEMENT N/A 03/01/2014   Procedure: INSERTION PORT-A-CATH;  Surgeon: [name], MD;  Location: [place];  Service: General;  Laterality: N/A;   PORTACATH PLACEMENT N/A 11/18/2018   Procedure: INSERTION PORT-A-CATH WITH ULTRASOUND;  Surgeon: [name], MD;  Location: [place];  Service: General;  Laterality: N/A;   REMOVAL OF BILATERAL TISSUE EXPANDERS WITH PLACEMENT OF BILATERAL BREAST IMPLANTS Bilateral  06/12/2020   Procedure: REMOVAL OF BILATERAL TISSUE EXPANDERS WITH PLACEMENT OF BILATERAL BREAST IMPLANTS;  Surgeon: Irene Limbo, MD;  Location: [place];  Service: Plastics;  Laterality: Bilateral;   REMOVAL OF TISSUE EXPANDER AND PLACEMENT OF IMPLANT Right 06/02/2019   Procedure: REMOVAL OF TISSUE EXPANDER RIGHT CHEST;  Surgeon: Irene Limbo, MD;  Location: [place];  Service: Plastics;  Laterality: Right;   TONSILLECTOMY     TUBAL LIGATION     WOUND DEBRIDEMENT Left 06/02/2019   Procedure: LEFT CHEST DEBRIDEMENT;  Surgeon: Irene Limbo, MD;  Location: [place];  Service: Plastics;  Laterality: Left;    OB History   No obstetric history on file.      Home Medications    Prior to Admission medications   Medication Sig Start Date End Date Taking? Authorizing Provider  amoxicillin (AMOXIL) 875 MG tablet Take 1 tablet (875 mg total) by mouth 2 (two) times daily for 7 days. 11/12/21 11/19/21 Yes Hazel Sams, PA-C  allopurinol (ZYLOPRIM) 100 MG tablet TAKE 1 TABLET BY MOUTH EVERY DAY 10/05/21   [name], DO  augmented betamethasone dipropionate (DIPROLENE-AF) 0.05 % cream Apply 1 application topically 2 (two) times daily  as needed. 08/25/19   [provider]  diltiazem (CARDIZEM CD) 240 MG 24 hr capsule TAKE 1 CAPSULE BY MOUTH EVERY DAY 02/02/21   Josue Hector, MD  liothyronine (CYTOMEL) 5 MCG tablet Take 5 mcg by mouth daily before breakfast.  11/13/13   [provider]  Saccharomyces boulardii (PROBIOTIC) 250 MG CAPS Take 1 tablet by mouth every other day. 11/24/19   Magrinat, Virgie Dad, MD  TIROSINT 137 MCG CAPS Take 1 capsule by mouth every morning. 11/03/19   [provider]  triamterene-hydrochlorothiazide (MAXZIDE-25) 37.5-25 MG tablet Take 1 tablet by mouth as needed. 12/26/20   Richardson Dopp T, PA-C  prochlorperazine (COMPAZINE) 10 MG tablet Take 1 tablet (10 mg total) by mouth every 6  (six) hours as needed (Nausea or vomiting). 11/13/18 05/11/19  Magrinat, Virgie Dad, MD    Family History Family History  Problem Relation Age of Onset   Endometrial cancer Mother 87   Hearing loss Mother    Atrial fibrillation Mother    Lung cancer Father    Brain cancer Father    Heart disease Maternal Aunt    Atrial fibrillation Maternal Aunt    Lung cancer Maternal Uncle    Atrial fibrillation Maternal Uncle    Stroke Maternal Grandmother    Stroke Maternal Grandfather    Bladder Cancer Maternal Uncle    Multiple myeloma Maternal Uncle    Other Son        trisomy 13   Colon polyps Cousin    Colon cancer Neg Hx    Esophageal cancer Neg Hx    Rectal cancer Neg Hx    Stomach cancer Neg Hx     Social History Social History   Tobacco Use   Smoking status: Former    Packs/day: 0.50    Years: 15.00    Pack years: 7.50    Types: Cigarettes   Smokeless tobacco: Never   Tobacco comments:    Quit smoking cigarettes in 2002  Vaping Use   Vaping Use: Never used  Substance Use Topics   Alcohol use: Not Currently   Drug use: No     Allergies   Chlorhexidine, Ciprofloxacin, Prednisone, Codeine, and Cortisone   Review of Systems Review of Systems  Constitutional:  Negative for appetite change, chills and fever.  HENT:  Positive for congestion and sinus pressure. Negative for ear pain, rhinorrhea, sinus pain and sore throat.   Eyes:  Negative for redness and visual disturbance.  Respiratory:  Negative for cough, chest tightness, shortness of breath and wheezing.   Cardiovascular:  Negative for chest pain and palpitations.  Gastrointestinal:  Negative for abdominal pain, constipation, diarrhea, nausea and vomiting.  Genitourinary:  Negative for dysuria, frequency and urgency.  Musculoskeletal:  Negative for myalgias.  Neurological:  Negative for dizziness, weakness and headaches.  Psychiatric/Behavioral:  Negative for confusion.   All other systems reviewed and are  negative.   Physical Exam Triage Vital Signs ED Triage Vitals  Enc Vitals Group     BP      Pulse      Resp      Temp      Temp src      SpO2      Weight      Height      Head Circumference      Peak Flow      Pain Score      Pain Loc      Pain Edu?  Excl. in [place]?    No data found.  Updated Vital Signs BP 139/76 (BP Location: Right Arm)    Pulse 93    Temp 100.3 F (37.9 C) (Oral) Comment: tylenol on board   Resp 18    SpO2 97%   Visual Acuity Right Eye Distance:   Left Eye Distance:   Bilateral Distance:    Right Eye Near:   Left Eye Near:    Bilateral Near:     Physical Exam Vitals reviewed.  Constitutional:      General: She is not in acute distress.    Appearance: Normal appearance. She is not ill-appearing.  HENT:     Head: Normocephalic and atraumatic.     Right Ear: Tympanic membrane, ear canal and external ear normal. No tenderness. No middle ear effusion. There is no impacted cerumen. Tympanic membrane is not perforated, erythematous, retracted or bulging.     Left Ear: Tympanic membrane, ear canal and external ear normal. No tenderness.  No middle ear effusion. There is no impacted cerumen. Tympanic membrane is not perforated, erythematous, retracted or bulging.     Nose: Congestion present.     Right Sinus: Maxillary sinus tenderness present. No frontal sinus tenderness.     Left Sinus: No maxillary sinus tenderness or frontal sinus tenderness.     Mouth/Throat:     Mouth: Mucous membranes are moist.     Pharynx: Uvula midline. No oropharyngeal exudate or posterior oropharyngeal erythema.  Eyes:     Extraocular Movements: Extraocular movements intact.     Pupils: Pupils are equal, round, and reactive to light.  Cardiovascular:     Rate and Rhythm: Normal rate and regular rhythm.     Heart sounds: Normal heart sounds.  Pulmonary:     Effort: Pulmonary effort is normal.     Breath sounds: Normal breath sounds. No decreased breath sounds,  wheezing, rhonchi or rales.  Abdominal:     Palpations: Abdomen is soft.     Tenderness: There is no abdominal tenderness. There is no guarding or rebound.  Lymphadenopathy:     Cervical: No cervical adenopathy.     Right cervical: No superficial cervical adenopathy.    Left cervical: No superficial cervical adenopathy.  Neurological:     General: No focal deficit present.     Mental Status: She is alert and oriented to person, place, and time.  Psychiatric:        Mood and Affect: Mood normal.        Behavior: Behavior normal.        Thought Content: Thought content normal.        Judgment: Judgment normal.     UC Treatments / Results  Labs (all labs ordered are listed, but only abnormal results are displayed) Labs Reviewed - No data to display  EKG   Radiology No results found.  Procedures Procedures (including critical care time)  Medications Ordered in UC Medications - No data to display  Initial Impression / Assessment and Plan / UC Course  I have reviewed the triage vital signs and the nursing notes.  Pertinent labs & imaging results that were available during my care of the patient were reviewed by me and considered in my medical decision making (see chart for details).     This patient is a very pleasant 61 y.o. year old female presenting with sinusitis. Borderline febrile but nontachy.   Negative home covid test x2.   Symptoms for 5 days. Presentation consistent with  bacterial sinusitis. Will manage with amoxicillin as below.   ED return precautions discussed. Patient verbalizes understanding and agreement.   Coding Level 4 for acute illness with systemic symptoms, and prescription drug management   Final Clinical Impressions(s) / UC Diagnoses   Final diagnoses:  Acute non-recurrent maxillary sinusitis     Discharge Instructions      -Amoxicillin twice daily x7 days  -Nasacort 1-2x daily while symptoms persist  -Continue over-the-counter  medications like Mucinex      ED Prescriptions     Medication Sig Dispense Auth. Provider   amoxicillin (AMOXIL) 875 MG tablet Take 1 tablet (875 mg total) by mouth 2 (two) times daily for 7 days. 14 tablet Hazel Sams, PA-C      PDMP not reviewed this encounter.   Hazel Sams, PA-C 11/12/21 1318

## 2021-11-12 NOTE — Discharge Instructions (Addendum)
-  Amoxicillin twice daily x7 days  -Nasacort 1-2x daily while symptoms persist  -Continue over-the-counter medications like Mucinex

## 2021-11-16 ENCOUNTER — Ambulatory Visit: Payer: Medicare Other | Admitting: Family Medicine

## 2021-11-21 ENCOUNTER — Other Ambulatory Visit: Payer: Self-pay | Admitting: Cardiovascular Disease

## 2021-11-26 ENCOUNTER — Encounter: Payer: Self-pay | Admitting: Family Medicine

## 2021-11-26 ENCOUNTER — Other Ambulatory Visit: Payer: Self-pay | Admitting: Family Medicine

## 2021-11-26 ENCOUNTER — Ambulatory Visit (INDEPENDENT_AMBULATORY_CARE_PROVIDER_SITE_OTHER): Payer: BC Managed Care – PPO | Admitting: Family Medicine

## 2021-11-26 VITALS — BP 130/78 | HR 62 | Temp 97.7°F | Resp 16 | Ht 65.0 in | Wt 165.8 lb

## 2021-11-26 DIAGNOSIS — M1A9XX0 Chronic gout, unspecified, without tophus (tophi): Secondary | ICD-10-CM | POA: Diagnosis not present

## 2021-11-26 DIAGNOSIS — C50912 Malignant neoplasm of unspecified site of left female breast: Secondary | ICD-10-CM | POA: Diagnosis not present

## 2021-11-26 DIAGNOSIS — G620 Drug-induced polyneuropathy: Secondary | ICD-10-CM | POA: Diagnosis not present

## 2021-11-26 DIAGNOSIS — T451X5A Adverse effect of antineoplastic and immunosuppressive drugs, initial encounter: Secondary | ICD-10-CM | POA: Diagnosis not present

## 2021-11-26 LAB — BASIC METABOLIC PANEL
BUN: 15 mg/dL (ref 6–23)
CO2: 25 mEq/L (ref 19–32)
Calcium: 10.2 mg/dL (ref 8.4–10.5)
Chloride: 102 mEq/L (ref 96–112)
Creatinine, Ser: 0.72 mg/dL (ref 0.40–1.20)
GFR: 89.29 mL/min (ref >60.00)
Glucose, Bld: 108 mg/dL — ABNORMAL HIGH (ref 70–99)
Potassium: 3.8 mEq/L (ref 3.5–5.1)
Sodium: 139 mEq/L (ref 135–145)

## 2021-11-26 LAB — URIC ACID: Uric Acid, Serum: 7.3 mg/dL — ABNORMAL HIGH (ref 2.4–7.0)

## 2021-11-26 MED ORDER — TRIAMCINOLONE ACETONIDE 0.1 % EX CREA: 1.0000 "application " | TOPICAL_CREAM | Freq: Two times a day (BID) | CUTANEOUS | 0 refills | Status: DC

## 2021-11-26 MED ORDER — PROBENECID 500 MG PO TABS: 500.0000 mg | ORAL_TABLET | Freq: Two times a day (BID) | ORAL | 2 refills | Status: DC

## 2021-11-26 NOTE — Progress Notes (Signed)
Chief Complaint  Patient presents with   Follow-up    Follow up on Meds    Alexandria Bradley is a 61 y.o. female here for gout.  Currently being treated with allopurinol 100 mg/d. The joint(s) affected include: L MTP Most recent uric acid level is: 6.3 Reports compliance. She started having an itchy rash on her back after starting the medicine. It has gotten worse over the past 2 mo.  Side effects of medications: None Is avoiding seafood, sweet/sugary beverages, alcohol, and red meats. No current flares.   Past Medical History:  Diagnosis Date   Anxiety    Arthritis    Back pain    Breast cancer ([facility])    left   Fatty liver    GERD (gastroesophageal reflux disease)    Headache(784.0)    PMH : migraines   History of kidney stones    Hypercholesterolemia    Hypertension    Hypothyroidism    Osteoporosis    Personal history of chemotherapy    Personal history of radiation therapy    PONV (postoperative nausea and vomiting)    difficult to wake up   Pre-diabetes    Psoriasis    PVC's (premature ventricular contractions)    Radiation 08/18/14-10/07/14   Left breast   Sleep apnea    no CPAP    BP 130/78 (BP Location: Right Arm, Patient Position: Sitting, Cuff Size: Normal)    Pulse 62    Temp 97.7 F (36.5 C) (Oral)    Resp 16    Ht 5\' 5"  (1.651 m)    Wt 165 lb 12.8 oz (75.2 kg)    SpO2 97%    BMI 27.59 kg/m  Gen: Awake, alert, appears stated age Neck: No masses or asymmetry Heart: RRR, no murmurs, no bruits, no LE edema Lungs: CTAB, no accessory muscle use MSK: no swelling or TTP, normal gait Skin: Excoriated and pink/red papules/macules on upper back; does blanch; no excessive warmth or drainage, no fluctuance Psych: Age appropriate judgment and insight, nml mood and affect  Chronic gout involving toe of left foot without tophus, unspecified cause - Plan: Basic metabolic panel, Uric acid, probenecid (BENEMID) 500 MG tablet  Neuropathy due to chemotherapeutic drug  ([place]), Chronic  Recurrent cancer of left breast ([place]), Chronic  AE to chronic med. Stop allopurinol. Start Probenecid 250 mg bid for 1 week and then 500 mg bid. Reminded to avoid foods like alcohol, sweet beverages, red meat, lunch meat, sea food. Kenalog cream given to help w itching.  F/u in 6 mo for CPE or prn. The patient voiced understanding and agreement to the plan.  Bell, DO 11/26/21 10:37 AM

## 2021-11-26 NOTE — Patient Instructions (Addendum)
Give [place] 2-3 business days to get the results of your labs back.   Take 1/2 tab twice daily of the Probenecid for a week and then 1 tab twice daily.   Keep the diet clean and stay active.  Try not to scratch as this can make things worse. Avoid scented products while dealing with this. You may resume when the itchiness resolves. Cold/cool compresses can help.   Foods to AVOID: Red meat, organ meat (liver), lunch meat, seafood (mussels, scallops, anchovies, etc) Alcohol Sugary foods/beverages (diet soft drinks have no link to flares)  Foods to migrate to: Dairy Vegetables Cherries have limited data to suggest they help lower uric acid levels (and prevent flares) Vit C (500 mg daily) may have a modest effect with preventing flares Poultry If you are going to eat red meat, beef and pork may give you less problems than lamb.  Let us know if you need anything.

## 2021-12-17 ENCOUNTER — Encounter: Payer: Self-pay | Admitting: Family Medicine

## 2021-12-19 ENCOUNTER — Encounter: Payer: Self-pay | Admitting: Family Medicine

## 2021-12-20 ENCOUNTER — Telehealth: Payer: Self-pay | Admitting: Adult Health

## 2021-12-20 NOTE — Telephone Encounter (Signed)
Called patient to inform her of the changes made to her upcoming appointment (fixing provider template) Left message. ?

## 2021-12-24 ENCOUNTER — Other Ambulatory Visit (INDEPENDENT_AMBULATORY_CARE_PROVIDER_SITE_OTHER): Payer: BC Managed Care – PPO

## 2021-12-24 DIAGNOSIS — M1A9XX0 Chronic gout, unspecified, without tophus (tophi): Secondary | ICD-10-CM

## 2021-12-24 DIAGNOSIS — E782 Mixed hyperlipidemia: Secondary | ICD-10-CM

## 2021-12-24 LAB — URIC ACID: Uric Acid, Serum: 5.4 mg/dL (ref 2.4–7.0)

## 2022-01-30 ENCOUNTER — Telehealth: Payer: Self-pay | Admitting: Adult Health

## 2022-01-30 NOTE — Telephone Encounter (Signed)
Rescheduled appointment per provider. Patient aware.  

## 2022-02-01 ENCOUNTER — Other Ambulatory Visit: Payer: No Typology Code available for payment source

## 2022-02-01 ENCOUNTER — Inpatient Hospital Stay: Payer: Medicare Other | Admitting: Adult Health

## 2022-02-01 ENCOUNTER — Inpatient Hospital Stay: Payer: Medicare Other

## 2022-02-01 ENCOUNTER — Ambulatory Visit: Payer: No Typology Code available for payment source | Admitting: Adult Health

## 2022-02-07 ENCOUNTER — Other Ambulatory Visit: Payer: Self-pay | Admitting: *Deleted

## 2022-02-07 DIAGNOSIS — C50212 Malignant neoplasm of upper-inner quadrant of left female breast: Secondary | ICD-10-CM

## 2022-02-08 ENCOUNTER — Inpatient Hospital Stay: Payer: BC Managed Care – PPO | Attending: Adult Health

## 2022-02-08 ENCOUNTER — Encounter: Payer: Self-pay | Admitting: Adult Health

## 2022-02-08 ENCOUNTER — Other Ambulatory Visit: Payer: Self-pay

## 2022-02-08 ENCOUNTER — Inpatient Hospital Stay (HOSPITAL_BASED_OUTPATIENT_CLINIC_OR_DEPARTMENT_OTHER): Payer: BC Managed Care – PPO | Admitting: Adult Health

## 2022-02-08 VITALS — BP 146/79 | HR 63 | Temp 97.5°F | Resp 18 | Ht 65.0 in | Wt 170.4 lb

## 2022-02-08 DIAGNOSIS — Z923 Personal history of irradiation: Secondary | ICD-10-CM | POA: Diagnosis not present

## 2022-02-08 DIAGNOSIS — R519 Headache, unspecified: Secondary | ICD-10-CM | POA: Diagnosis not present

## 2022-02-08 DIAGNOSIS — Z87891 Personal history of nicotine dependence: Secondary | ICD-10-CM | POA: Diagnosis not present

## 2022-02-08 DIAGNOSIS — Z853 Personal history of malignant neoplasm of breast: Secondary | ICD-10-CM | POA: Diagnosis not present

## 2022-02-08 DIAGNOSIS — R42 Dizziness and giddiness: Secondary | ICD-10-CM | POA: Insufficient documentation

## 2022-02-08 DIAGNOSIS — C50212 Malignant neoplasm of upper-inner quadrant of left female breast: Secondary | ICD-10-CM

## 2022-02-08 DIAGNOSIS — C50912 Malignant neoplasm of unspecified site of left female breast: Secondary | ICD-10-CM | POA: Diagnosis not present

## 2022-02-08 DIAGNOSIS — Z9221 Personal history of antineoplastic chemotherapy: Secondary | ICD-10-CM | POA: Insufficient documentation

## 2022-02-08 LAB — CBC WITH DIFFERENTIAL (CANCER CENTER ONLY)
Abs Immature Granulocytes: 0.02 10*3/uL (ref 0.00–0.07)
Basophils Absolute: 0 10*3/uL (ref 0.0–0.1)
Basophils Relative: 1 %
Eosinophils Absolute: 0.4 10*3/uL (ref 0.0–0.5)
Eosinophils Relative: 7 %
HCT: 39.4 % (ref 36.0–46.0)
Hemoglobin: 13.4 g/dL (ref 12.0–15.0)
Immature Granulocytes: 0 %
Lymphocytes Relative: 28 %
Lymphs Abs: 1.9 10*3/uL (ref 0.7–4.0)
MCH: 30.7 pg (ref 26.0–34.0)
MCHC: 34 g/dL (ref 30.0–36.0)
MCV: 90.4 fL (ref 80.0–100.0)
Monocytes Absolute: 0.6 10*3/uL (ref 0.1–1.0)
Monocytes Relative: 9 %
Neutro Abs: 3.7 10*3/uL (ref 1.7–7.7)
Neutrophils Relative %: 55 %
Platelet Count: 257 10*3/uL (ref 150–400)
RBC: 4.36 MIL/uL (ref 3.87–5.11)
RDW: 12.7 % (ref 11.5–15.5)
WBC Count: 6.6 10*3/uL (ref 4.0–10.5)
nRBC: 0 % (ref 0.0–0.2)

## 2022-02-08 LAB — CMP (CANCER CENTER ONLY)
ALT: 23 U/L (ref 0–44)
AST: 17 U/L (ref 15–41)
Albumin: 4.4 g/dL (ref 3.5–5.0)
Alkaline Phosphatase: 73 U/L (ref 38–126)
Anion gap: 8 (ref 5–15)
BUN: 21 mg/dL (ref 8–23)
CO2: 27 mmol/L (ref 22–32)
Calcium: 10 mg/dL (ref 8.9–10.3)
Chloride: 104 mmol/L (ref 98–111)
Creatinine: 0.8 mg/dL (ref 0.44–1.00)
GFR, Estimated: >60 mL/min (ref >60)
Glucose, Bld: 111 mg/dL — ABNORMAL HIGH (ref 70–99)
Potassium: 3.8 mmol/L (ref 3.5–5.1)
Sodium: 139 mmol/L (ref 135–145)
Total Bilirubin: 0.5 mg/dL (ref 0.3–1.2)
Total Protein: 7.4 g/dL (ref 6.5–8.1)

## 2022-02-08 NOTE — Assessment & Plan Note (Addendum)
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  She is status post chemotherapy, surgery, and radiation. ? ?Alexandria Bradley is doing well today.  Overall she does not have any overt signs and symptoms of breast cancer recurrence.  She did have some tenderness to palpation in her left upper chest wall.  If this continues I recommended that we get a set of x-rays on her left ribs to evaluate.  Additionally she also has been experiencing atypical dizziness associated with headaches.  I suggested that we consider doing a brain MRI to further evaluate considering her history of triple negative recurrent breast cancer.  I explained that it could very well be a vertigo inner ear issue however the acoustic nerve which maintains our equilibrium is also in the brain.  Although this may Alexandria Bradley anxious to hear she is considering proceeding with the brain MRI.  I offered to give her some antianxiety medicine before the MRI to help take the edge off since they do make her quite claustrophobic.  She is going to consider the brain MRI we will talk to her husband and let me know if she decides to proceed.   ? ?Alexandria Bradley will return in 6 months for labs and follow-up with Dr. Chryl Heck. ?

## 2022-02-08 NOTE — Progress Notes (Signed)
[name]: ?  ? ?Alexandria Pal, DO ?[address] ?[place] [address] ? ? ?DIAGNOSIS: triple negative breast cancer, locally recurrent (s/p bilateral mastectomies) ? ?SUMMARY OF ONCOLOGIC HISTORY: ?Oncology History  ?Malignant neoplasm of upper-inner quadrant of left breast in female, estrogen receptor negative ([name])  ?02/11/2014 Initial Diagnosis  ? Malignant neoplasm of upper-inner quadrant of left breast in female, estrogen receptor negative ([name]) ? ?  ?11/26/2018 - 03/18/2019 Chemotherapy  ? The patient had dexamethasone (DECADRON) 4 MG tablet, 8 mg, Oral, Daily, 1 of 1 cycle, Start date: 11/13/2018, End date: 01/28/2019 ?palonosetron (ALOXI) injection 0.25 mg, 0.25 mg, Intravenous,  Once, 6 of 6 cycles ?Administration: 0.25 mg (11/26/2018), 0.25 mg (12/03/2018), 0.25 mg (03/11/2019), 0.25 mg (03/18/2019), 0.25 mg (01/07/2019), 0.25 mg (12/17/2018), 0.25 mg (12/24/2018), 0.25 mg (01/14/2019), 0.25 mg (01/28/2019), 0.25 mg (02/04/2019), 0.25 mg (02/18/2019), 0.25 mg (02/25/2019) ?gemcitabine (GEMZAR) 2,000 mg in sodium chloride 0.9 % 250 mL chemo infusion, 2,000 mg, Intravenous,  Once, 1 of 1 cycle ?Administration: 2,000 mg (02/18/2019) ?CARBOplatin (PARAPLATIN) 250 mg in sodium chloride 0.9 % 250 mL chemo infusion, 250 mg (100 % of original dose 253.2 mg), Intravenous,  Once, 6 of 6 cycles ?Dose modification:   (original dose 253.2 mg, Cycle 1) ?Administration: 250 mg (11/26/2018), 250 mg (12/03/2018), 250 mg (03/11/2019), 250 mg (03/18/2019), 250 mg (01/07/2019), 250 mg (12/17/2018), 250 mg (12/24/2018), 250 mg (01/14/2019), 250 mg (01/28/2019), 250 mg (02/04/2019), 250 mg (02/18/2019), 250 mg (02/25/2019) ?gemcitabine (GEMZAR) 1,976 mg in sodium chloride 0.9 % 250 mL chemo infusion, 1,000 mg/m2 = 1,976 mg, Intravenous,  Once, 6 of 6 cycles ?Administration: 1,976 mg (11/26/2018), 2,000 mg (12/03/2018), 2,000 mg (03/11/2019), 2,000 mg (03/18/2019), 2,000 mg (01/07/2019), 2,000 mg (12/17/2018),  2,000 mg (12/24/2018), 2,000 mg (01/14/2019), 2,000 mg (01/28/2019), 2,000 mg (02/04/2019), 2,000 mg (02/25/2019) ? ? for chemotherapy treatment.  ? ?  ?06/09/2019 - 09/22/2019 Chemotherapy  ? The patient had pembrolizumab (KEYTRUDA) 200 mg in sodium chloride 0.9 % 50 mL chemo infusion, 200 mg, Intravenous, Once, 6 of 6 cycles ?Administration: 200 mg (06/09/2019), 200 mg (06/30/2019), 200 mg (07/21/2019), 200 mg (08/11/2019), 200 mg (09/01/2019), 200 mg (09/22/2019) ? ? for chemotherapy treatment.  ? ?  ?Recurrent cancer of left breast ([name])  ?11/13/2018 Initial Diagnosis  ? Recurrent cancer of left breast ([name]) ? ?  ?11/26/2018 - 03/18/2019 Chemotherapy  ? The patient had dexamethasone (DECADRON) 4 MG tablet, 8 mg, Oral, Daily, 1 of 1 cycle, Start date: 11/13/2018, End date: 01/28/2019 ?palonosetron (ALOXI) injection 0.25 mg, 0.25 mg, Intravenous,  Once, 6 of 6 cycles ?Administration: 0.25 mg (11/26/2018), 0.25 mg (12/03/2018), 0.25 mg (03/11/2019), 0.25 mg (03/18/2019), 0.25 mg (01/07/2019), 0.25 mg (12/17/2018), 0.25 mg (12/24/2018), 0.25 mg (01/14/2019), 0.25 mg (01/28/2019), 0.25 mg (02/04/2019), 0.25 mg (02/18/2019), 0.25 mg (02/25/2019) ?gemcitabine (GEMZAR) 2,000 mg in sodium chloride 0.9 % 250 mL chemo infusion, 2,000 mg, Intravenous,  Once, 1 of 1 cycle ?Administration: 2,000 mg (02/18/2019) ?CARBOplatin (PARAPLATIN) 250 mg in sodium chloride 0.9 % 250 mL chemo infusion, 250 mg (100 % of original dose 253.2 mg), Intravenous,  Once, 6 of 6 cycles ?Dose modification:   (original dose 253.2 mg, Cycle 1) ?Administration: 250 mg (11/26/2018), 250 mg (12/03/2018), 250 mg (03/11/2019), 250 mg (03/18/2019), 250 mg (01/07/2019), 250 mg (12/17/2018), 250 mg (12/24/2018), 250 mg (01/14/2019), 250 mg (01/28/2019), 250 mg (02/04/2019), 250 mg (02/18/2019), 250 mg (02/25/2019) ?gemcitabine (GEMZAR) 1,976 mg in sodium chloride 0.9 %  250 mL chemo infusion, 1,000 mg/m2 = 1,976 mg, Intravenous,  Once, 6 of 6 cycles ?Administration: 1,976 mg (11/26/2018), 2,000 mg  (12/03/2018), 2,000 mg (03/11/2019), 2,000 mg (03/18/2019), 2,000 mg (01/07/2019), 2,000 mg (12/17/2018), 2,000 mg (12/24/2018), 2,000 mg (01/14/2019), 2,000 mg (01/28/2019), 2,000 mg (02/04/2019), 2,000 mg (02/25/2019) ? ? for chemotherapy treatment.  ? ?  ? ? ?CURRENT THERAPY: Observation ? ?INTERVAL HISTORY: ?Alexandria Bradley 61 y.o. female returns for evaluation of her h/o locally recurrent triple negative breast cancer.  She has completed treatment and is on observation.  She has had a rash and has been using different creams, detergents, and all other personal care products.  She is seeing dermatology and they have diagnosed her with an irritant dermatitis.   ? ? ?Alexandria Bradley also notes that there are some areas in her breast that Dr. Donne Hazel is following closely as they are fat necrosis. ? ?Alexandria Bradley has been experiencing intermittent episodes of dizziness.  She has a history of vertigo however this she says is slightly worse with room spinning and dizziness that occurs when she is not moving positions or turning her head.  She also has been having some headaches which she has been prone to get from time to time.  These are not increased in frequency or intensity however. ? ?Since her last visit she has retired.  She has not been exercising like she did previously because of the irritant dermatitis when she sweats it is very itchy for her.  She does watch what she eats with plenty of fruits and vegetables every day.  She is up-to-date with her primary care follow-up and her other cancer screenings. ? ?Patient Active Problem List  ? Diagnosis Date Noted  ? Chronic gout involving toe of left foot without tophus 05/11/2021  ? Recurrent cancer of left breast ([name]) 11/13/2018  ? Neuropathy due to chemotherapeutic drug ([name]) 11/13/2018  ? Prediabetes 05/28/2018  ? OSA (obstructive sleep apnea) 08/20/2016  ? Adjustment disorder with mixed anxiety and depressed mood 08/20/2016  ? Genetic testing 07/07/2015  ? Family history of  uterine cancer   ? Family history of bladder cancer   ? Fatty infiltration of liver 07/08/2014  ? Chemotherapy-induced nausea 06/10/2014  ? Malignant neoplasm of upper-inner quadrant of left breast in female, estrogen receptor negative ([place]) 02/11/2014  ? HLD (hyperlipidemia) 05/11/2010  ? PSORIASIS 05/11/2010  ? Nonspecific (abnormal) findings on radiological and other examination of body structure 05/11/2010  ? CHEST XRAY, ABNORMAL 05/11/2010  ? Hypothyroidism 05/10/2010  ? Essential hypertension 09/02/2007  ? Allergic rhinitis, cause unspecified 09/02/2007  ? ? ?is allergic to chlorhexidine, ciprofloxacin, prednisone, codeine, cortisone, and colchicine. ? ?MEDICAL HISTORY: ?Past Medical History:  ?Diagnosis Date  ? Anxiety   ? Arthritis   ? Back pain   ? Breast cancer ([place])   ? left  ? Fatty liver   ? GERD (gastroesophageal reflux disease)   ? Headache(784.0)   ? PMH : migraines  ? History of kidney stones   ? Hypercholesterolemia   ? Hypertension   ? Hypothyroidism   ? Osteoporosis   ? Personal history of chemotherapy   ? Personal history of radiation therapy   ? PONV (postoperative nausea and vomiting)   ? difficult to wake up  ? Pre-diabetes   ? Psoriasis   ? PVC's (premature ventricular contractions)   ? Radiation 08/18/14-10/07/14  ? Left breast  ? Sleep apnea   ? no CPAP  ? ? ?SURGICAL HISTORY: ?Past Surgical History:  ?  Procedure Laterality Date  ? BREAST BIOPSY Left 11/10/2018  ? BREAST LUMPECTOMY Left 2015  ? BREAST LUMPECTOMY WITH AXILLARY LYMPH NODE BIOPSY  6/15  ? left  ? BREAST RECONSTRUCTION WITH PLACEMENT OF TISSUE EXPANDER AND ALLODERM Bilateral 04/12/2019  ? Procedure: BILATERAL BREAST RECONSTRUCTION WITH PLACEMENT OF TISSUE EXPANDERS; ALLODERM TO RIGHT CHEST;  Surgeon: Irene Limbo, MD;  Location: [facility];  Service: Plastics;  Laterality: Bilateral;  ? BREAST RECONSTRUCTION WITH PLACEMENT OF TISSUE EXPANDER AND ALLODERM Right 01/24/2020  ? Procedure: BREAST RECONSTRUCTION WITH PLACEMENT OF TISSUE  EXPANDER AND ALLODERM;  Surgeon: Irene Limbo, MD;  Location: [facility];  Service: Plastics;  Laterality: Right;  ? CARDIAC CATHETERIZATION    ? CHOLECYSTECTOMY    ? colon polyps    ? COLONO

## 2022-02-15 ENCOUNTER — Other Ambulatory Visit: Payer: Self-pay | Admitting: Cardiovascular Disease

## 2022-02-18 ENCOUNTER — Other Ambulatory Visit: Payer: Self-pay | Admitting: Family Medicine

## 2022-02-18 DIAGNOSIS — M1A9XX0 Chronic gout, unspecified, without tophus (tophi): Secondary | ICD-10-CM

## 2022-03-13 ENCOUNTER — Other Ambulatory Visit: Payer: Self-pay | Admitting: Cardiovascular Disease

## 2022-03-14 NOTE — Progress Notes (Signed)
Cardiology Office Note:    Date:  03/15/2022   ID:  [name], DOB [birth year], MRN [id number]  PCP:  Wendling, Nicholas Paul, [place]  Cardiologist:  Jenkins Rouge, MD   Electrophysiologist:  None       Referring MD: [name]*   Chief Complaint:  No chief complaint on file.    Patient Profile:    Alexandria Bradley is a 61 y.o. female with:  Palpitations Cardiac monitor 10/19: NSR with PVCs Intolerant of beta-blockers secondary to fatigue Hypertension Hyperlipidemia Hypothyroidism Cardiac catheterization 10/19-no CAD L breast CA  Prior CV studies: Event monitor 07/28/2018 NSR Isolated PVCls  No significant NSVT Subjective feeling of skips and fluttering does usually  Correlate with PVCls   Echocardiogram 07/28/2018 EF 55-60, no RWMA, GR 1 DD, mild to moderately reduced RVSF  Cardiac catheterization 07/24/2018 Normal coronary arteries     History of Present Illness:    61 y.o. I have never seen in person Followed mostly by PA;s and I had a telemedicine visit with her on 11/19/19 .  [name]  She is here alone. Really only had benign palpitations , HTN improved with exercise and weight loss, and HLD.  No CAD by cath in 2019 with normal echo and benign monitor. Seen by lipid clinic   after finishing chemo May 2022 for LDL 167and started on low dose crestor Needs update labs   She is in observation for her cancer with some areas of fat necrosis left breast that Dr Donne Hazel watching Has had some dizziness/vertigo and headaches Gets irritant dermatitis with sweating and not exercising as much Primary suggesting MRI of brain but patient claustrophobic and deferred   She has not started statin as advised Made excuses about issue with rash/gout medicine Discussed genetic nature of that high of LDL and fact that diet won't help Discussed utility of calcium score to risk stratify Review of recent CT for  her breast cancer shows calcium in her LAD/RCA     Past Medical History:  Diagnosis Date   Anxiety    Arthritis    Back pain    Breast cancer ([facility])    left   Fatty liver    GERD (gastroesophageal reflux disease)    Headache(784.0)    PMH : migraines   History of kidney stones    Hypercholesterolemia    Hypertension    Hypothyroidism    Osteoporosis    Personal history of chemotherapy    Personal history of radiation therapy    PONV (postoperative nausea and vomiting)    difficult to wake up   Pre-diabetes    Psoriasis    PVC's (premature ventricular contractions)    Radiation 08/18/14-10/07/14   Left breast   Sleep apnea    no CPAP    Current Medications: Current Meds  Medication Sig   augmented betamethasone dipropionate (DIPROLENE-AF) 0.05 % cream Apply 1 application  topically 2 (two) times daily as needed.   diltiazem (CARDIZEM CD) 240 MG 24 hr capsule Take 1 capsule (240 mg total) by mouth daily. Please schedule appointment for future refills. Thank you   Levothyroxine Sodium (TIROSINT) 150 MCG CAPS Take 150 mcg by mouth daily before breakfast.   liothyronine (CYTOMEL) 5 MCG tablet Take 5 mcg by mouth daily before breakfast.    probenecid (BENEMID) 500 MG tablet TAKE 1 TABLET BY MOUTH TWICE A DAY   Saccharomyces boulardii (PROBIOTIC) 250 MG CAPS Take 1  tablet by mouth every other day.   triamcinolone cream (KENALOG) 0.1 % Apply 1 application topically 2 (two) times daily.   triamterene-hydrochlorothiazide (MAXZIDE-25) 37.5-25 MG tablet TAKE 1 TABLET BY MOUTH DAILY. PLEASE SCHEDULE APPOINTMENT FOR FUTURE REFILLS. THANK YOU     Allergies:   Chlorhexidine, Ciprofloxacin, Prednisone, Codeine, Cortisone, and Colchicine   Social History   Tobacco Use   Smoking status: Former    Packs/day: 0.50    Years: 15.00    Total pack years: 7.50    Types: Cigarettes   Smokeless tobacco: Never   Tobacco comments:    Quit smoking cigarettes in 2002  Vaping Use   Vaping Use:  Never used  Substance Use Topics   Alcohol use: Not Currently   Drug use: No     Family Hx: The patient's family history includes Atrial fibrillation in her maternal aunt, maternal uncle, and mother; Bladder Cancer in her maternal uncle; Brain cancer in her father; Colon polyps in her cousin; Endometrial cancer (age of onset: 57) in her mother; Hearing loss in her mother; Heart disease in her maternal aunt; Lung cancer in her father and maternal uncle; Multiple myeloma in her maternal uncle; Other in her son; Stroke in her maternal grandfather and maternal grandmother. There is no history of Colon cancer, Esophageal cancer, Rectal cancer, or Stomach cancer.  ROS   EKGs/Labs/Other Test Reviewed:    EKG:  03/15/2022 SR Rate 61 voltage LVH otherwise normal  Recent Labs: 05/11/2021: Magnesium 2.1 08/06/2021: TSH 0.135 02/08/2022: ALT 23; BUN 21; Creatinine 0.80; Hemoglobin 13.4; Platelet Count 257; Potassium 3.8; Sodium 139s, no ST-T wave changes, QTC 472, similar to prior tracings   Recent Lipid Panel Lab Results  Component Value Date/Time   CHOL 229 (H) 01/09/2021 08:55 AM   TRIG 139 01/09/2021 08:55 AM   TRIG 209 (HH) 09/22/2006 10:56 AM   HDL 36 (L) 01/09/2021 08:55 AM   CHOLHDL 6.4 (H) 01/09/2021 08:55 AM   CHOLHDL 6.3 CALC 01/12/2008 08:15 AM   LDLCALC 167 (H) 01/09/2021 08:55 AM   LDLDIRECT 175.2 01/12/2008 08:15 AM      Risk Assessment/Calculations:      Physical Exam:    VS:  BP 120/82   Pulse 61   Ht _0  (1.651 m)   Wt 167 lb (75.8 kg)   SpO2 97%   BMI 27.79 kg/m     Wt Readings from Last 3 Encounters:  03/15/22 167 lb (75.8 kg)  02/08/22 170 lb 6.7 oz (77.3 kg)  11/26/21 165 lb 12.8 oz (75.2 kg)     Affect appropriate Healthy:  appears stated age HEENT: normal Neck supple with no adenopathy JVP normal no bruits no thyromegaly Lungs clear with no wheezing and good diaphragmatic motion Heart:  S1/S2 no murmur, no rub, gallop or click PMI normal post  left breast surgery  Abdomen: benighn, BS positve, no tenderness, no AAA no bruit.  No HSM or HJR Distal pulses intact with no bruits No edema Neuro non-focal Skin warm and dry No muscular weakness        ASSESSMENT & PLAN:    1. Palpitations Benign no need for monitoring continue cardizem   2. Essential hypertension Continue cardizem and HCTZ  3. Mixed hyperlipidemia Was to start crestor 5 mg daily 02/12/21   Never done will update labs today and do calcium score then refer to lipid clinic If not cost prohibitive she would prefer to start non statin medication Discussed Nexlitol as non injection alternative  Dispo:  F/U lipid clinic and cardiology in 6 months   Medication Adjustments/Labs and Tests Ordered: Current medicines are reviewed at length with the patient today.  Concerns regarding medicines are outlined above.  Tests Ordered: Orders Placed This Encounter  Procedures   CT CARDIAC SCORING (SELF PAY ONLY)   Lipid panel   Hepatic function panel   AMB Referral to [facility] Pharm-D   EKG 12-Lead   Medication Changes: No orders of the defined types were placed in this encounter.   Signed, Jenkins Rouge, MD  03/15/2022 9:56 AM    [address] [facility] [address], [place], [place]  [address] Phone: [phone]; Fax: [phone]

## 2022-03-15 ENCOUNTER — Encounter: Payer: Self-pay | Admitting: Cardiovascular Disease

## 2022-03-15 ENCOUNTER — Ambulatory Visit (INDEPENDENT_AMBULATORY_CARE_PROVIDER_SITE_OTHER): Payer: BC Managed Care – PPO | Admitting: Cardiovascular Disease

## 2022-03-15 VITALS — BP 120/82 | HR 61 | Ht 65.0 in | Wt 167.0 lb

## 2022-03-15 DIAGNOSIS — R002 Palpitations: Secondary | ICD-10-CM

## 2022-03-15 DIAGNOSIS — I10 Essential (primary) hypertension: Secondary | ICD-10-CM | POA: Diagnosis not present

## 2022-03-15 DIAGNOSIS — E782 Mixed hyperlipidemia: Secondary | ICD-10-CM

## 2022-03-15 LAB — LIPID PANEL
Chol/HDL Ratio: 4.7 ratio — ABNORMAL HIGH (ref 0.0–4.4)
Cholesterol, Total: 197 mg/dL (ref 100–199)
HDL: 42 mg/dL (ref >39)
LDL Chol Calc (NIH): 132 mg/dL — ABNORMAL HIGH (ref 0–99)
Triglycerides: 125 mg/dL (ref 0–149)
VLDL Cholesterol Cal: 23 mg/dL (ref 5–40)

## 2022-03-15 LAB — HEPATIC FUNCTION PANEL
ALT: 24 IU/L (ref 0–32)
AST: 18 IU/L (ref 0–40)
Albumin: 4.6 g/dL (ref 3.8–4.8)
Alkaline Phosphatase: 99 IU/L (ref 44–121)
Bilirubin Total: 0.4 mg/dL (ref 0.0–1.2)
Bilirubin, Direct: 0.14 mg/dL (ref 0.00–0.40)
Total Protein: 7.2 g/dL (ref 6.0–8.5)

## 2022-03-15 NOTE — Patient Instructions (Addendum)
Medication Instructions:  Your physician recommends that you continue on your current medications as directed. Please refer to the Current Medication list given to you today.  *If you need a refill on your cardiac medications before your next appointment, please call your pharmacy*  Lab Work: Your physician recommends that you to have lab work today- fasting lipid and liver panel.  If you have labs (blood work) drawn today and your tests are completely normal, you will receive your results only by: [place] (if you have MyChart) OR A paper copy in the mail If you have any lab test that is abnormal or we need to change your treatment, we will call you to review the results.  Testing/Procedures: Cardiac CT scanning for calcium score, (CAT scanning), is a noninvasive, special x-ray that produces cross-sectional images of the body using x-rays and a computer. CT scans help physicians diagnose and treat medical conditions. For some CT exams, a contrast material is used to enhance visibility in the area of the body being studied. CT scans provide greater clarity and reveal more details than regular x-ray exams.  Follow-Up: At [facility], you and your health needs are our priority.  As part of our continuing mission to provide you with exceptional heart care, we have created designated Provider Care Teams.  These Care Teams include your primary Cardiologist (physician) and Advanced Practice Providers (APPs -  Physician Assistants and Nurse Practitioners) who all work together to provide you with the care you need, when you need it.  We recommend signing up for the patient portal called "MyChart".  Sign up information is provided on this After Visit Summary.  MyChart is used to connect with patients for Virtual Visits (Telemedicine).  Patients are able to view lab/test results, encounter notes, upcoming appointments, etc.  Non-urgent messages can be sent to your provider as well.   To learn  more about what you can do with MyChart, go to [email or web address].    Your next appointment:   1 year   The format for your next appointment:   In Person  Provider:   Jenkins Rouge, MD {  You have been referred to [facility] after CT is completed.    Important Information About Sugar

## 2022-03-25 ENCOUNTER — Ambulatory Visit (INDEPENDENT_AMBULATORY_CARE_PROVIDER_SITE_OTHER)
Admission: RE | Admit: 2022-03-25 | Discharge: 2022-03-25 | Disposition: A | Discharge status: Home / Self Care | Payer: Self-pay | Source: Ambulatory Visit | Attending: Cardiovascular Disease | Admitting: Cardiovascular Disease

## 2022-03-25 DIAGNOSIS — I10 Essential (primary) hypertension: Secondary | ICD-10-CM

## 2022-03-25 DIAGNOSIS — R002 Palpitations: Secondary | ICD-10-CM

## 2022-03-25 DIAGNOSIS — E782 Mixed hyperlipidemia: Secondary | ICD-10-CM

## 2022-03-27 ENCOUNTER — Telehealth: Payer: Self-pay | Admitting: Cardiovascular Disease

## 2022-03-27 NOTE — Telephone Encounter (Signed)
Pt is returning call in regards to results. Transferred to Michaelyn Barter, RN.

## 2022-03-27 NOTE — Telephone Encounter (Signed)
Patient aware of results of CT.

## 2022-04-12 ENCOUNTER — Other Ambulatory Visit: Payer: Self-pay | Admitting: Family Medicine

## 2022-04-12 DIAGNOSIS — M1A9XX0 Chronic gout, unspecified, without tophus (tophi): Secondary | ICD-10-CM

## 2022-04-16 ENCOUNTER — Ambulatory Visit (INDEPENDENT_AMBULATORY_CARE_PROVIDER_SITE_OTHER): Payer: BC Managed Care – PPO | Admitting: Pharmacist

## 2022-04-16 DIAGNOSIS — K760 Fatty (change of) liver, not elsewhere classified: Secondary | ICD-10-CM

## 2022-04-16 DIAGNOSIS — I10 Essential (primary) hypertension: Secondary | ICD-10-CM

## 2022-04-16 DIAGNOSIS — E782 Mixed hyperlipidemia: Secondary | ICD-10-CM

## 2022-04-16 NOTE — Patient Instructions (Addendum)
Try to cut back on red meat Eat plenty of vegetables, seeds, nuts, lean meats Add back in some strength training and try to increase your walking back to daily   Please call me at [phone] with any questions

## 2022-04-16 NOTE — Progress Notes (Signed)
Patient ID: Alexandria Bradley                 DOB: 06/09/1961                    MRN: [id number]     HPI: Alexandria Bradley is a 61 y.o. female patient referred to lipid clinic by Dr. Johnsie Cancel. PMH is significant for palpitations, HTN, HLD, hypothyroidism, breast cancer. She had a cardiac cath in 2019 that showed normal coronary arteries. She had a CAC score done on 03/25/22 which showed a score of 5.49 (61st percentile). All calcium was in the LAD. Patient hesitant to start a statin and therefore was referred to lipid clinic.  Patient presents to lipid clinic today. She states she met with the lipid clinic last year. She lowered her LDL-C with diet without even really trying per pt report. Patient states that she really isn't interested in taking any new medications. Has been having issues with her gout medication and a rash that may or may not be from APAP and IBU. She reports that she has fatty liver and is concerned about it. She tries to eat a low carb diet, not keto. Just wants to focus on her liver.   Current Medications: none Intolerances:  Risk Factors: HTN, positive CAC score LDL goal: <70  Diet: breakfast: eggs and bacon (every now and then) often doesn't eat breakfast Lunch: Dinner: salad, spaghetti (heart of palm noodles), hamburger, chicken, vegetables Drink: mostly water, tea w/ a little bit of sugar, coffee w/ cream  Exercise: walks on treadmill (30-4mn) 2-4 times a week, swimming  Family History:  Family History  Problem Relation Age of Onset   Endometrial cancer Mother 760  Hearing loss Mother    Atrial fibrillation Mother    Lung cancer Father    Brain cancer Father    Heart disease Maternal Aunt    Atrial fibrillation Maternal Aunt    Lung cancer Maternal Uncle    Atrial fibrillation Maternal Uncle    Stroke Maternal Grandmother    Stroke Maternal Grandfather    Bladder Cancer Maternal Uncle    Multiple myeloma Maternal Uncle    Other Son        trisomy 13    Colon polyps Cousin    Colon cancer Neg Hx    Esophageal cancer Neg Hx    Rectal cancer Neg Hx    Stomach cancer Neg Hx      Social History: quit smoking 25 years ago, no etoh use  Labs: 03/15/22 TC 197, TG 125, HDL 42 LDL-C 132 (no medication)  Past Medical History:  Diagnosis Date   Anxiety    Arthritis    Back pain    [place]ast [place] ([place]    left   Fatty liver    GERD (gastroesophageal reflux disease)    Headache(784.0)    PMH : migraines   History of kidney stones    Hypercholesterolemia    Hypertension    Hypothyroidism    Osteoporosis    Personal history of chemotherapy    Personal history of radiation therapy    PONV (postoperative nausea and vomiting)    difficult to wake up   Pre-diabetes    Psoriasis    PVC's (premature ventricular contractions)    Radiation 08/18/14-10/07/14   Left breast   Sleep apnea    no CPAP    Current Outpatient Medications on File Prior to Visit  Medication Sig Dispense Refill  augmented betamethasone dipropionate (DIPROLENE-AF) 0.05 % cream Apply 1 application  topically 2 (two) times daily as needed.     diltiazem (CARDIZEM CD) 240 MG 24 hr capsule Take 1 capsule (240 mg total) by mouth daily. Please schedule appointment for future refills. Thank you 90 capsule 0   Levothyroxine Sodium (TIROSINT) 150 MCG CAPS Take 150 mcg by mouth daily before breakfast.     liothyronine (CYTOMEL) 5 MCG tablet Take 5 mcg by mouth daily before breakfast.      probenecid (BENEMID) 500 MG tablet TAKE 1 TABLET BY MOUTH TWICE A DAY 60 tablet 2   Saccharomyces boulardii (PROBIOTIC) 250 MG CAPS Take 1 tablet by mouth every other day. 60 capsule    triamcinolone cream (KENALOG) 0.1 % Apply 1 application topically 2 (two) times daily. 60 g 0   triamterene-hydrochlorothiazide (MAXZIDE-25) 37.5-25 MG tablet TAKE 1 TABLET BY MOUTH DAILY. PLEASE SCHEDULE APPOINTMENT FOR FUTURE REFILLS. THANK YOU 30 tablet 0   [DISCONTINUED] prochlorperazine (COMPAZINE) 10 MG  tablet Take 1 tablet (10 mg total) by mouth every 6 (six) hours as needed (Nausea or vomiting). 30 tablet 1   No current facility-administered medications on file prior to visit.    Allergies  Allergen Reactions   Chlorhexidine Itching   Ciprofloxacin Anxiety, Rash and Other (See Comments)    GI upset   Prednisone Shortness Of Breath and Other (See Comments)    "jacks me up" jittery    Codeine Nausea Only   Cortisone Swelling and Other (See Comments)    "jack me up"    Colchicine Nausea Only    Assessment/Plan:  1. Hyperlipidemia - LDL-C is above goal of <70 due to positive CAC score. Patient very hesitant to start any medications. We reviewed what her CAC score tells [place] and what it doesn't tell us. We discussed that her NAFLD puts her at risk of CVD. Statins can help lower her risk of CVD. There is also some evidence that statins can be beneficial in NAFLD despite the stigma that they are dangerous. We also discussed that non-statin options, excluding ezetimibe, wont be approved by insurance if she has not been on a statin. Despite this education, patient still does not want to start any medication.  We discussed the 5 modifiable risk factors for CVD (lipids, BP, Tobacco use, diet and exercise) BP is controlled, patient does not use tobacco. Lipids discussed above. I have encouraged patient to eat a diet of real food, ie avoid processed foods, limit red meat, eat plenty of vegetables. No sugary beverages (soda, sweet tea, juice). I have encouraged patient to increase exercise. Daily walking and add back strength/resistance training at least 2-3 days a week.   Thank you,   Ramond Dial, Pharm.D, BCPS, CPP [facility]  [address], [place], [place] [address]  Phone: [phone]; Fax: [phone]

## 2022-04-17 ENCOUNTER — Other Ambulatory Visit: Payer: Self-pay | Admitting: Cardiovascular Disease

## 2022-05-13 ENCOUNTER — Other Ambulatory Visit: Payer: Self-pay | Admitting: Cardiovascular Disease

## 2022-05-28 ENCOUNTER — Other Ambulatory Visit: Payer: Self-pay | Admitting: Family Medicine

## 2022-05-28 ENCOUNTER — Ambulatory Visit (INDEPENDENT_AMBULATORY_CARE_PROVIDER_SITE_OTHER): Payer: BC Managed Care – PPO | Admitting: Family Medicine

## 2022-05-28 ENCOUNTER — Encounter: Payer: Self-pay | Admitting: Family Medicine

## 2022-05-28 VITALS — BP 136/78 | HR 56 | Temp 97.9°F | Ht 65.0 in | Wt 171.0 lb

## 2022-05-28 DIAGNOSIS — E785 Hyperlipidemia, unspecified: Secondary | ICD-10-CM

## 2022-05-28 DIAGNOSIS — Z0000 Encounter for general adult medical examination without abnormal findings: Secondary | ICD-10-CM

## 2022-05-28 DIAGNOSIS — E876 Hypokalemia: Secondary | ICD-10-CM

## 2022-05-28 DIAGNOSIS — Z23 Encounter for immunization: Secondary | ICD-10-CM

## 2022-05-28 DIAGNOSIS — M5442 Lumbago with sciatica, left side: Secondary | ICD-10-CM

## 2022-05-28 DIAGNOSIS — M1A9XX0 Chronic gout, unspecified, without tophus (tophi): Secondary | ICD-10-CM

## 2022-05-28 DIAGNOSIS — E038 Other specified hypothyroidism: Secondary | ICD-10-CM | POA: Diagnosis not present

## 2022-05-28 LAB — CBC
HCT: 39.3 % (ref 36.0–46.0)
Hemoglobin: 13.4 g/dL (ref 12.0–15.0)
MCHC: 34 g/dL (ref 30.0–36.0)
MCV: 91.3 fl (ref 78.0–100.0)
Platelets: 289 10*3/uL (ref 150.0–400.0)
RBC: 4.31 Mil/uL (ref 3.87–5.11)
RDW: 13.9 % (ref 11.5–15.5)
WBC: 6.9 10*3/uL (ref 4.0–10.5)

## 2022-05-28 LAB — COMPREHENSIVE METABOLIC PANEL
ALT: 22 U/L (ref 0–35)
AST: 16 U/L (ref 0–37)
Albumin: 4.7 g/dL (ref 3.5–5.2)
Alkaline Phosphatase: 94 U/L (ref 39–117)
BUN: 24 mg/dL — ABNORMAL HIGH (ref 6–23)
CO2: 27 mEq/L (ref 19–32)
Calcium: 10.3 mg/dL (ref 8.4–10.5)
Chloride: 101 mEq/L (ref 96–112)
Creatinine, Ser: 0.83 mg/dL (ref 0.40–1.20)
GFR: 75.02 mL/min (ref >60.00)
Glucose, Bld: 109 mg/dL — ABNORMAL HIGH (ref 70–99)
Potassium: 3.4 mEq/L — ABNORMAL LOW (ref 3.5–5.1)
Sodium: 140 mEq/L (ref 135–145)
Total Bilirubin: 0.7 mg/dL (ref 0.2–1.2)
Total Protein: 7.5 g/dL (ref 6.0–8.3)

## 2022-05-28 LAB — LIPID PANEL
Cholesterol: 194 mg/dL (ref 0–200)
HDL: 43.8 mg/dL (ref >39.00)
LDL Cholesterol: 117 mg/dL — ABNORMAL HIGH (ref 0–99)
NonHDL: 149.83
Total CHOL/HDL Ratio: 4
Triglycerides: 166 mg/dL — ABNORMAL HIGH (ref 0.0–149.0)
VLDL: 33.2 mg/dL (ref 0.0–40.0)

## 2022-05-28 LAB — URIC ACID: Uric Acid, Serum: 6.2 mg/dL (ref 2.4–7.0)

## 2022-05-28 LAB — TSH: TSH: 0.03 u[IU]/mL — ABNORMAL LOW (ref 0.35–5.50)

## 2022-05-28 LAB — T4, FREE: Free T4: 0.94 ng/dL (ref 0.60–1.60)

## 2022-05-28 MED ORDER — KETOROLAC TROMETHAMINE 60 MG/2ML IM SOLN
60.0000 mg | Freq: Once | INTRAMUSCULAR | Status: AC
  Administered 2022-05-28: 60 mg via INTRAMUSCULAR

## 2022-05-28 MED ORDER — TIZANIDINE HCL 4 MG PO TABS: 4.0000 mg | ORAL_TABLET | Freq: Four times a day (QID) | ORAL | 0 refills | Status: DC | PRN

## 2022-05-28 NOTE — Progress Notes (Signed)
Chief Complaint  Patient presents with   Annual Exam    Back pain      Well Woman Alexandria Bradley is here for a complete physical.   Her last physical was >1 year ago.  Current diet: in general, a "healthy" diet. Current exercise: Some walking. Weight is stable and she denies fatigue out of ordinary. Seatbelt? Yes Advanced directive? No  Health Maintenance Pap/HPV- Yes Mammogram-no, she has a history of breast cancer and no longer is monitored via mammogram Colon cancer screening-Yes Shingrix-she had the first 1 and felt terribly.  She does not wish to get the second 1 due to this. Tetanus-due Hep C screening- Yes HIV screening- Yes  Left low back pain History of left-sided back pain with associated sciatica.  She has shooting pain radiating down her left lower extremity.  2 days ago she started having a recurrence of this.  There is no specific injury or change in activity.  She denies any redness, bruising, or swelling.  No neurologic signs or symptoms.  No bowel/bladder incontinence.  She uses Tylenol without significant relief.  Past Medical History:  Diagnosis Date   Anxiety    Arthritis    Back pain    Breast cancer ([place])    left   Fatty liver    GERD (gastroesophageal reflux disease)    Headache(784.0)    PMH : migraines   History of kidney stones    Hypercholesterolemia    Hypertension    Hypothyroidism    Osteoporosis    Personal history of chemotherapy    Personal history of radiation therapy    PONV (postoperative nausea and vomiting)    difficult to wake up   Pre-diabetes    Psoriasis    PVC's (premature ventricular contractions)    Radiation 08/18/14-10/07/14   Left breast   Sleep apnea    no CPAP     Past Surgical History:  Procedure Laterality Date   BREAST BIOPSY Left 11/10/2018   BREAST LUMPECTOMY Left 2015   BREAST LUMPECTOMY WITH AXILLARY LYMPH NODE BIOPSY  6/15   left   BREAST RECONSTRUCTION WITH PLACEMENT OF TISSUE EXPANDER AND  ALLODERM Bilateral 04/12/2019   Procedure: BILATERAL BREAST RECONSTRUCTION WITH PLACEMENT OF TISSUE EXPANDERS; ALLODERM TO RIGHT CHEST;  Surgeon: Irene Limbo, MD;  Location: [facility];  Service: Plastics;  Laterality: Bilateral;   BREAST RECONSTRUCTION WITH PLACEMENT OF TISSUE EXPANDER AND ALLODERM Right 01/24/2020   Procedure: BREAST RECONSTRUCTION WITH PLACEMENT OF TISSUE EXPANDER AND ALLODERM;  Surgeon: Irene Limbo, MD;  Location: [facility];  Service: Plastics;  Laterality: Right;   CARDIAC CATHETERIZATION     CHOLECYSTECTOMY     colon polyps     COLONOSCOPY N/A 06/27/2014   Procedure: COLONOSCOPY;  Surgeon: Lear Ng, MD;  Location: [facility];  Service: Endoscopy;  Laterality: N/A;   LATISSIMUS FLAP TO BREAST Left 04/12/2019   Procedure: LEFT LATISSIMUS DORSI FLAP TO LEFT CHEST;  Surgeon: Irene Limbo, MD;  Location: [place];  Service: Plastics;  Laterality: Left;   LEFT HEART CATH AND CORONARY ANGIOGRAPHY N/A 07/24/2018   Procedure: LEFT HEART CATH AND CORONARY ANGIOGRAPHY;  Surgeon: Troy Sine, MD;  Location: [facility];  Service: Cardiovascular;  Laterality: N/A;   LIPOMA EXCISION     LIPOSUCTION WITH LIPOFILLING Bilateral 06/12/2020   Procedure: LIPOFILLING TO BILATERAL CHEST;  Surgeon: Irene Limbo, MD;  Location: [place] [facility];  Service: Plastics;  Laterality: Bilateral;   MASTECTOMY W/ SENTINEL  NODE BIOPSY Bilateral 04/12/2019   Procedure: RIGHT BREAST RISK REDUCING MASTECTOMY; LEFT MASTECTOMY WITH LEFT AXILLARY SENTINEL LYMPH NODE BIOPSY WITH BLUE DYE INJECTION;  Surgeon: [name], MD;  Location: [place];  Service: General;  Laterality: Bilateral;   MYOMECTOMY     PORT-A-CATH REMOVAL N/A 02/03/2015   Procedure: REMOVAL PORT-A-CATH;  Surgeon: [name], MD;  Location: [place];  Service: General;  Laterality: N/A;   PORT-A-CATH REMOVAL Right 01/24/2020   Procedure: REMOVAL PORT-A-CATH;   Surgeon: Irene Limbo, MD;  Location: [place];  Service: Plastics;  Laterality: Right;   PORTACATH PLACEMENT N/A 03/01/2014   Procedure: INSERTION PORT-A-CATH;  Surgeon: [name], MD;  Location: [place];  Service: General;  Laterality: N/A;   PORTACATH PLACEMENT N/A 11/18/2018   Procedure: INSERTION PORT-A-CATH WITH ULTRASOUND;  Surgeon: [name], MD;  Location: [place] [facility];  Service: General;  Laterality: N/A;   REMOVAL OF BILATERAL TISSUE EXPANDERS WITH PLACEMENT OF BILATERAL BREAST IMPLANTS Bilateral 06/12/2020   Procedure: REMOVAL OF BILATERAL TISSUE EXPANDERS WITH PLACEMENT OF BILATERAL BREAST IMPLANTS;  Surgeon: Irene Limbo, MD;  Location: [place];  Service: Plastics;  Laterality: Bilateral;   REMOVAL OF TISSUE EXPANDER AND PLACEMENT OF IMPLANT Right 06/02/2019   Procedure: REMOVAL OF TISSUE EXPANDER RIGHT CHEST;  Surgeon: Irene Limbo, MD;  Location: [place];  Service: Plastics;  Laterality: Right;   TONSILLECTOMY     TUBAL LIGATION     WOUND DEBRIDEMENT Left 06/02/2019   Procedure: LEFT CHEST DEBRIDEMENT;  Surgeon: Irene Limbo, MD;  Location: [place];  Service: Plastics;  Laterality: Left;    Medications  Current Outpatient Medications on File Prior to Visit  Medication Sig Dispense Refill   augmented betamethasone dipropionate (DIPROLENE-AF) 0.05 % cream Apply 1 application  topically 2 (two) times daily as needed.     diltiazem (CARDIZEM CD) 240 MG 24 hr capsule Take 1 capsule (240 mg total) by mouth daily. 90 capsule 3   Levothyroxine Sodium (TIROSINT) 150 MCG CAPS Take 150 mcg by mouth daily before breakfast.     liothyronine (CYTOMEL) 5 MCG tablet Take 5 mcg by mouth daily before breakfast.      probenecid (BENEMID) 500 MG tablet TAKE 1 TABLET BY MOUTH TWICE A DAY 60 tablet 2   Saccharomyces boulardii (PROBIOTIC) 250 MG CAPS Take 1 tablet by mouth every other day.  60 capsule    triamcinolone cream (KENALOG) 0.1 % Apply 1 application topically 2 (two) times daily. 60 g 0   triamterene-hydrochlorothiazide (MAXZIDE-25) 37.5-25 MG tablet Take 1 tablet by mouth daily. 90 tablet 3   [DISCONTINUED] prochlorperazine (COMPAZINE) 10 MG tablet Take 1 tablet (10 mg total) by mouth every 6 (six) hours as needed (Nausea or vomiting). 30 tablet 1   Allergies Allergies  Allergen Reactions   Chlorhexidine Itching   Ciprofloxacin Anxiety, Rash and Other (See Comments)    GI upset   Prednisone Shortness Of Breath and Other (See Comments)    "jacks me up" jittery    Codeine Nausea Only   Cortisone Swelling and Other (See Comments)    "jack me up"    Colchicine Nausea Only    Review of Systems: Constitutional:  no unexpected weight changes Eye:  no recent significant change in vision Ear/Nose/Mouth/Throat:  Ears:  no recent change in hearing Nose/Mouth/Throat:  no complaints of nasal congestion, no sore throat Cardiovascular: no chest pain Respiratory:  no shortness of breath Gastrointestinal:  no  abdominal pain, no change in bowel habits GU:  Female: negative for dysuria or pelvic pain Musculoskeletal/Extremities:  + Back pain on the left Integumentary (Skin/Breast):  no abnormal skin lesions reported Neurologic:  no headaches Endocrine:  denies fatigue  Exam BP 136/78 (BP Location: Right Arm, Cuff Size: Normal)   Pulse (!) 56   Temp 97.9 F (36.6 C) (Oral)   Ht '5\' 5"'$  (1.651 m)   Wt 171 lb (77.6 kg)   SpO2 99%   BMI 28.46 kg/m  General:  well developed, well nourished, in no apparent distress Skin:  no significant moles, warts, or growths Head:  no masses, lesions, or tenderness Eyes:  pupils equal and round, sclera anicteric without injection Ears:  canals without lesions, TMs shiny without retraction, no obvious effusion, no erythema Nose:  nares patent, septum midline, mucosa normal, and no drainage or sinus tenderness Throat/Pharynx:  lips  and gingiva without lesion; tongue and uvula midline; non-inflamed pharynx; no exudates or postnasal drainage Neck: neck supple without adenopathy, thyromegaly, or masses Lungs:  clear to auscultation, breath sounds equal bilaterally, no respiratory distress Cardio: Bradycardic, regular rhythm, no LE edema Abdomen:  abdomen soft, nontender; bowel sounds normal; no masses or organomegaly Genital: Defer to GYN Musculoskeletal: TTP over the left lumbar paraspinal musculature and erector spinae muscle group, tight hamstrings bilaterally, otherwise symmetrical muscle groups noted without atrophy or deformity Extremities:  no clubbing, cyanosis, or edema, no deformities, no skin discoloration Neuro:  gait normal; deep tendon reflexes normal and symmetric, negative straight leg bilaterally Psych: well oriented with normal range of affect and appropriate judgment/insight  Assessment and Plan  Well adult exam - Plan: CBC, Comprehensive metabolic panel, Lipid panel  Chronic gout involving toe of left foot without tophus, unspecified cause - Plan: Uric acid  Other specified hypothyroidism - Plan: TSH, T4, free  Acute left-sided low back pain with left-sided sciatica - Plan: tiZANidine (ZANAFLEX) 4 MG tablet, ketorolac (TORADOL) injection 60 mg  Need for Tdap vaccination - Plan: Tdap vaccine greater than or equal to 28yo IM   Well 61 y.o. female. Counseled on diet and exercise. Advanced directive form provided today.  Other orders as above. Tdap today. Suggested Shingrix but given her side effects, would not push the matter. Low back pain: Toradol today.  She does not do well with steroids.  Zanaflex as needed.  She has stretches and exercises for her low back which she will resume today. Follow up in 6 mo. The patient voiced understanding and agreement to the plan.  [place], DO 05/28/22 12:01 PM

## 2022-05-28 NOTE — Patient Instructions (Addendum)
Give [place] 2-3 business days to get the results of your labs back.   Keep the diet clean and stay active.  Foods that may reduce pain: 1) Ginger 2) Blueberries 3) Salmon 4) Pumpkin seeds 5) dark chocolate 6) turmeric 7) tart cherries 8) virgin olive oil 9) chilli peppers 10) mint 11) krill oil  I recommend getting the flu shot in mid October. This suggestion would change if the CDC comes out with a different recommendation.   Please get me a copy of your advanced directive form at your convenience.   Go back to the stretches/exercises with your back.   Let us know if you need anything.

## 2022-06-04 ENCOUNTER — Other Ambulatory Visit (INDEPENDENT_AMBULATORY_CARE_PROVIDER_SITE_OTHER): Payer: BC Managed Care – PPO

## 2022-06-04 DIAGNOSIS — E785 Hyperlipidemia, unspecified: Secondary | ICD-10-CM | POA: Diagnosis not present

## 2022-06-04 DIAGNOSIS — E876 Hypokalemia: Secondary | ICD-10-CM | POA: Diagnosis not present

## 2022-06-04 LAB — MAGNESIUM: Magnesium: 2.1 mg/dL (ref 1.5–2.5)

## 2022-06-04 LAB — LIPID PANEL
Cholesterol: 214 mg/dL — ABNORMAL HIGH (ref 0–200)
HDL: 46.8 mg/dL (ref >39.00)
LDL Cholesterol: 143 mg/dL — ABNORMAL HIGH (ref 0–99)
NonHDL: 166.94
Total CHOL/HDL Ratio: 5
Triglycerides: 119 mg/dL (ref 0.0–149.0)
VLDL: 23.8 mg/dL (ref 0.0–40.0)

## 2022-06-04 LAB — BASIC METABOLIC PANEL
BUN: 24 mg/dL — ABNORMAL HIGH (ref 6–23)
CO2: 27 mEq/L (ref 19–32)
Calcium: 10.2 mg/dL (ref 8.4–10.5)
Chloride: 100 mEq/L (ref 96–112)
Creatinine, Ser: 0.79 mg/dL (ref 0.40–1.20)
GFR: 79.59 mL/min (ref >60.00)
Glucose, Bld: 105 mg/dL — ABNORMAL HIGH (ref 70–99)
Potassium: 3.7 mEq/L (ref 3.5–5.1)
Sodium: 139 mEq/L (ref 135–145)

## 2022-06-07 ENCOUNTER — Other Ambulatory Visit: Payer: Medicare Other

## 2022-06-13 ENCOUNTER — Telehealth: Payer: Self-pay | Admitting: Family Medicine

## 2022-06-13 NOTE — Telephone Encounter (Signed)
Left message for patient to call back and schedule Medicare Annual Wellness Visit (AWV).   Please offer to do virtually or by telephone.  Left office number and my jabber #[phone].  AWVI eligible as of 04/30/2022   Please schedule at anytime with Nurse Health Advisor.   

## 2022-07-17 ENCOUNTER — Other Ambulatory Visit: Payer: Self-pay | Admitting: Family Medicine

## 2022-07-17 DIAGNOSIS — M1A9XX0 Chronic gout, unspecified, without tophus (tophi): Secondary | ICD-10-CM

## 2022-07-19 ENCOUNTER — Other Ambulatory Visit: Payer: Self-pay | Admitting: General Surgery

## 2022-07-19 DIAGNOSIS — N6310 Unspecified lump in the right breast, unspecified quadrant: Secondary | ICD-10-CM

## 2022-08-01 ENCOUNTER — Other Ambulatory Visit: Payer: Self-pay | Admitting: General Surgery

## 2022-08-01 ENCOUNTER — Other Ambulatory Visit: Payer: Medicare Other

## 2022-08-01 DIAGNOSIS — N6310 Unspecified lump in the right breast, unspecified quadrant: Secondary | ICD-10-CM

## 2022-08-01 DIAGNOSIS — N6320 Unspecified lump in the left breast, unspecified quadrant: Secondary | ICD-10-CM

## 2022-08-07 ENCOUNTER — Other Ambulatory Visit: Payer: Self-pay | Admitting: *Deleted

## 2022-08-07 DIAGNOSIS — Z171 Estrogen receptor negative status [ER-]: Secondary | ICD-10-CM

## 2022-08-08 ENCOUNTER — Inpatient Hospital Stay (HOSPITAL_BASED_OUTPATIENT_CLINIC_OR_DEPARTMENT_OTHER): Payer: BC Managed Care – PPO | Admitting: Hematology and Oncology

## 2022-08-08 ENCOUNTER — Encounter: Payer: Self-pay | Admitting: Hematology and Oncology

## 2022-08-08 ENCOUNTER — Inpatient Hospital Stay: Payer: BC Managed Care – PPO | Attending: Hematology and Oncology

## 2022-08-08 VITALS — BP 137/72 | HR 60 | Temp 97.9°F | Resp 16 | Ht 65.0 in | Wt 170.6 lb

## 2022-08-08 DIAGNOSIS — Z923 Personal history of irradiation: Secondary | ICD-10-CM | POA: Diagnosis not present

## 2022-08-08 DIAGNOSIS — Z9013 Acquired absence of bilateral breasts and nipples: Secondary | ICD-10-CM | POA: Insufficient documentation

## 2022-08-08 DIAGNOSIS — K760 Fatty (change of) liver, not elsewhere classified: Secondary | ICD-10-CM | POA: Insufficient documentation

## 2022-08-08 DIAGNOSIS — Z171 Estrogen receptor negative status [ER-]: Secondary | ICD-10-CM

## 2022-08-08 DIAGNOSIS — C50212 Malignant neoplasm of upper-inner quadrant of left female breast: Secondary | ICD-10-CM | POA: Insufficient documentation

## 2022-08-08 DIAGNOSIS — C50912 Malignant neoplasm of unspecified site of left female breast: Secondary | ICD-10-CM

## 2022-08-08 LAB — CMP (CANCER CENTER ONLY)
ALT: 21 U/L (ref 0–44)
AST: 17 U/L (ref 15–41)
Albumin: 4.5 g/dL (ref 3.5–5.0)
Alkaline Phosphatase: 85 U/L (ref 38–126)
Anion gap: 8 (ref 5–15)
BUN: 17 mg/dL (ref 8–23)
CO2: 28 mmol/L (ref 22–32)
Calcium: 10.1 mg/dL (ref 8.9–10.3)
Chloride: 104 mmol/L (ref 98–111)
Creatinine: 0.83 mg/dL (ref 0.44–1.00)
GFR, Estimated: >60 mL/min (ref >60)
Glucose, Bld: 102 mg/dL — ABNORMAL HIGH (ref 70–99)
Potassium: 3.9 mmol/L (ref 3.5–5.1)
Sodium: 140 mmol/L (ref 135–145)
Total Bilirubin: 0.6 mg/dL (ref 0.3–1.2)
Total Protein: 7.5 g/dL (ref 6.5–8.1)

## 2022-08-08 LAB — CBC WITH DIFFERENTIAL (CANCER CENTER ONLY)
Abs Immature Granulocytes: 0.02 10*3/uL (ref 0.00–0.07)
Basophils Absolute: 0 10*3/uL (ref 0.0–0.1)
Basophils Relative: 0 %
Eosinophils Absolute: 0.4 10*3/uL (ref 0.0–0.5)
Eosinophils Relative: 5 %
HCT: 40 % (ref 36.0–46.0)
Hemoglobin: 13.7 g/dL (ref 12.0–15.0)
Immature Granulocytes: 0 %
Lymphocytes Relative: 31 %
Lymphs Abs: 2.1 10*3/uL (ref 0.7–4.0)
MCH: 31 pg (ref 26.0–34.0)
MCHC: 34.3 g/dL (ref 30.0–36.0)
MCV: 90.5 fL (ref 80.0–100.0)
Monocytes Absolute: 0.7 10*3/uL (ref 0.1–1.0)
Monocytes Relative: 10 %
Neutro Abs: 3.7 10*3/uL (ref 1.7–7.7)
Neutrophils Relative %: 54 %
Platelet Count: 280 10*3/uL (ref 150–400)
RBC: 4.42 MIL/uL (ref 3.87–5.11)
RDW: 12.6 % (ref 11.5–15.5)
WBC Count: 6.9 10*3/uL (ref 4.0–10.5)
nRBC: 0 % (ref 0.0–0.2)

## 2022-08-08 NOTE — Progress Notes (Signed)
[facility] Follow up:    Alexandria Pal, DO [address] [place] [address]   DIAGNOSIS: triple negative breast cancer, locally recurrent (s/p bilateral mastectomies)  SUMMARY OF ONCOLOGIC HISTORY: Oncology History  Malignant neoplasm of upper-inner quadrant of left breast in female, estrogen receptor negative ([facility])  02/11/2014 Initial Diagnosis   Malignant neoplasm of upper-inner quadrant of left breast in female, estrogen receptor negative ([facility])   11/26/2018 - 03/18/2019 Chemotherapy   The patient had dexamethasone (DECADRON) 4 MG tablet, 8 mg, Oral, Daily, 1 of 1 cycle, Start date: 11/13/2018, End date: 01/28/2019 palonosetron (ALOXI) injection 0.25 mg, 0.25 mg, Intravenous,  Once, 6 of 6 cycles Administration: 0.25 mg (11/26/2018), 0.25 mg (12/03/2018), 0.25 mg (03/11/2019), 0.25 mg (03/18/2019), 0.25 mg (01/07/2019), 0.25 mg (12/17/2018), 0.25 mg (12/24/2018), 0.25 mg (01/14/2019), 0.25 mg (01/28/2019), 0.25 mg (02/04/2019), 0.25 mg (02/18/2019), 0.25 mg (02/25/2019) gemcitabine (GEMZAR) 2,000 mg in sodium chloride 0.9 % 250 mL chemo infusion, 2,000 mg, Intravenous,  Once, 1 of 1 cycle Administration: 2,000 mg (02/18/2019) CARBOplatin (PARAPLATIN) 250 mg in sodium chloride 0.9 % 250 mL chemo infusion, 250 mg (100 % of original dose 253.2 mg), Intravenous,  Once, 6 of 6 cycles Dose modification:   (original dose 253.2 mg, Cycle 1) Administration: 250 mg (11/26/2018), 250 mg (12/03/2018), 250 mg (03/11/2019), 250 mg (03/18/2019), 250 mg (01/07/2019), 250 mg (12/17/2018), 250 mg (12/24/2018), 250 mg (01/14/2019), 250 mg (01/28/2019), 250 mg (02/04/2019), 250 mg (02/18/2019), 250 mg (02/25/2019) gemcitabine (GEMZAR) 1,976 mg in sodium chloride 0.9 % 250 mL chemo infusion, 1,000 mg/m2 = 1,976 mg, Intravenous,  Once, 6 of 6 cycles Administration: 1,976 mg (11/26/2018), 2,000 mg (12/03/2018), 2,000 mg (03/11/2019), 2,000 mg (03/18/2019), 2,000 mg (01/07/2019), 2,000 mg (12/17/2018),  2,000 mg (12/24/2018), 2,000 mg (01/14/2019), 2,000 mg (01/28/2019), 2,000 mg (02/04/2019), 2,000 mg (02/25/2019)  for chemotherapy treatment.    06/09/2019 - 09/22/2019 Chemotherapy   The patient had pembrolizumab (KEYTRUDA) 200 mg in sodium chloride 0.9 % 50 mL chemo infusion, 200 mg, Intravenous, Once, 6 of 6 cycles Administration: 200 mg (06/09/2019), 200 mg (06/30/2019), 200 mg (07/21/2019), 200 mg (08/11/2019), 200 mg (09/01/2019), 200 mg (09/22/2019)  for chemotherapy treatment.    Recurrent cancer of left breast ([facility])  11/13/2018 Initial Diagnosis   Recurrent cancer of left breast ([facility])   11/26/2018 - 03/18/2019 Chemotherapy   The patient had dexamethasone (DECADRON) 4 MG tablet, 8 mg, Oral, Daily, 1 of 1 cycle, Start date: 11/13/2018, End date: 01/28/2019 palonosetron (ALOXI) injection 0.25 mg, 0.25 mg, Intravenous,  Once, 6 of 6 cycles Administration: 0.25 mg (11/26/2018), 0.25 mg (12/03/2018), 0.25 mg (03/11/2019), 0.25 mg (03/18/2019), 0.25 mg (01/07/2019), 0.25 mg (12/17/2018), 0.25 mg (12/24/2018), 0.25 mg (01/14/2019), 0.25 mg (01/28/2019), 0.25 mg (02/04/2019), 0.25 mg (02/18/2019), 0.25 mg (02/25/2019) gemcitabine (GEMZAR) 2,000 mg in sodium chloride 0.9 % 250 mL chemo infusion, 2,000 mg, Intravenous,  Once, 1 of 1 cycle Administration: 2,000 mg (02/18/2019) CARBOplatin (PARAPLATIN) 250 mg in sodium chloride 0.9 % 250 mL chemo infusion, 250 mg (100 % of original dose 253.2 mg), Intravenous,  Once, 6 of 6 cycles Dose modification:   (original dose 253.2 mg, Cycle 1) Administration: 250 mg (11/26/2018), 250 mg (12/03/2018), 250 mg (03/11/2019), 250 mg (03/18/2019), 250 mg (01/07/2019), 250 mg (12/17/2018), 250 mg (12/24/2018), 250 mg (01/14/2019), 250 mg (01/28/2019), 250 mg (02/04/2019), 250 mg (02/18/2019), 250 mg (02/25/2019) gemcitabine (GEMZAR) 1,976 mg in sodium chloride 0.9 % 250 mL chemo infusion, 1,000 mg/m2 =  1,976 mg, Intravenous,  Once, 6 of 6 cycles Administration: 1,976 mg (11/26/2018), 2,000 mg (12/03/2018), 2,000  mg (03/11/2019), 2,000 mg (03/18/2019), 2,000 mg (01/07/2019), 2,000 mg (12/17/2018), 2,000 mg (12/24/2018), 2,000 mg (01/14/2019), 2,000 mg (01/28/2019), 2,000 mg (02/04/2019), 2,000 mg (02/25/2019)  for chemotherapy treatment.      CURRENT THERAPY: Observation  INTERVAL HISTORY:  Alexandria Bradley 61 y.o. female returns for evaluation of her h/o locally recurrent triple negative breast cancer.   She reports chronic fatigue, need to adjust levothyroxine dose multiple times.  She has some knots in the left breast, now has [place] scheduled to follow up.  She also reports skin rash on her back which she wonders if this is related to her fatty liver.  She tells me however she has quit many medications that could hurt her liver and has noticed that the skin rash has improved slowly. She denies any other complaints today. Rest of the pertinent 10 point ROS reviewed and negative.  Patient Active Problem List   Diagnosis Date Noted   Chronic gout involving toe of left foot without tophus 05/11/2021   Recurrent cancer of left breast ([facility]) 11/13/2018   Neuropathy due to chemotherapeutic drug ([facility]) 11/13/2018   Prediabetes 05/28/2018   OSA (obstructive sleep apnea) 08/20/2016   Adjustment disorder with mixed anxiety and depressed mood 08/20/2016   Genetic testing 07/07/2015   Family history of uterine cancer    Family history of bladder cancer    Fatty infiltration of liver 07/08/2014   Chemotherapy-induced nausea 06/10/2014   Malignant neoplasm of upper-inner quadrant of left breast in female, estrogen receptor negative ([facility]) 02/11/2014   HLD (hyperlipidemia) 05/11/2010   PSORIASIS 05/11/2010   Nonspecific (abnormal) findings on radiological and other examination of body structure 05/11/2010   CHEST XRAY, ABNORMAL 05/11/2010   Hypothyroidism 05/10/2010   Essential hypertension 09/02/2007   Allergic rhinitis, cause unspecified 09/02/2007    is allergic to chlorhexidine, ciprofloxacin, prednisone,  codeine, cortisone, and colchicine.  MEDICAL HISTORY: Past Medical History:  Diagnosis Date   Anxiety    Arthritis    Back pain    Breast cancer ([facility])    left   Fatty liver    GERD (gastroesophageal reflux disease)    Headache(784.0)    PMH : migraines   History of kidney stones    Hypercholesterolemia    Hypertension    Hypothyroidism    Osteoporosis    Personal history of chemotherapy    Personal history of radiation therapy    PONV (postoperative nausea and vomiting)    difficult to wake up   Pre-diabetes    Psoriasis    PVC's (premature ventricular contractions)    Radiation 08/18/14-10/07/14   Left breast   Sleep apnea    no CPAP    SURGICAL HISTORY: Past Surgical History:  Procedure Laterality Date   BREAST BIOPSY Left 11/10/2018   BREAST LUMPECTOMY Left 2015   BREAST LUMPECTOMY WITH AXILLARY LYMPH NODE BIOPSY  6/15   left   BREAST RECONSTRUCTION WITH PLACEMENT OF TISSUE EXPANDER AND ALLODERM Bilateral 04/12/2019   Procedure: BILATERAL BREAST RECONSTRUCTION WITH PLACEMENT OF TISSUE EXPANDERS; ALLODERM TO RIGHT CHEST;  Surgeon: Irene Limbo, MD;  Location: [facility];  Service: Plastics;  Laterality: Bilateral;   BREAST RECONSTRUCTION WITH PLACEMENT OF TISSUE EXPANDER AND ALLODERM Right 01/24/2020   Procedure: BREAST RECONSTRUCTION WITH PLACEMENT OF TISSUE EXPANDER AND ALLODERM;  Surgeon: Irene Limbo, MD;  Location: [facility];  Service: Plastics;  Laterality: Right;  CARDIAC CATHETERIZATION     CHOLECYSTECTOMY     colon polyps     COLONOSCOPY N/A 06/27/2014   Procedure: COLONOSCOPY;  Surgeon: Lear Ng, MD;  Location: [facility];  Service: Endoscopy;  Laterality: N/A;   LATISSIMUS FLAP TO BREAST Left 04/12/2019   Procedure: LEFT LATISSIMUS DORSI FLAP TO LEFT CHEST;  Surgeon: Irene Limbo, MD;  Location: [facility];  Service: Plastics;  Laterality: Left;   LEFT HEART CATH AND CORONARY ANGIOGRAPHY N/A 07/24/2018   Procedure: LEFT  HEART CATH AND CORONARY ANGIOGRAPHY;  Surgeon: [place] Sine, MD;  Location: [facility];  Service: Cardiovascular;  Laterality: N/A;   LIPOMA EXCISION     LIPOSUCTION WITH LIPOFILLING Bilateral 06/12/2020   Procedure: LIPOFILLING TO BILATERAL CHEST;  Surgeon: Irene Limbo, MD;  Location: [facility];  Service: Plastics;  Laterality: Bilateral;   MASTECTOMY W/ SENTINEL NODE BIOPSY Bilateral 04/12/2019   Procedure: RIGHT BREAST RISK REDUCING MASTECTOMY; LEFT MASTECTOMY WITH LEFT AXILLARY SENTINEL LYMPH NODE BIOPSY WITH BLUE DYE INJECTION;  Surgeon: [name], MD;  Location: [facility];  Service: General;  Laterality: Bilateral;   MYOMECTOMY     PORT-A-CATH REMOVAL N/A 02/03/2015   Procedure: REMOVAL PORT-A-CATH;  Surgeon: [name], MD;  Location: [facility];  Service: General;  Laterality: N/A;   PORT-A-CATH REMOVAL Right 01/24/2020   Procedure: REMOVAL PORT-A-CATH;  Surgeon: Irene Limbo, MD;  Location: [facility];  Service: Plastics;  Laterality: Right;   PORTACATH PLACEMENT N/A 03/01/2014   Procedure: INSERTION PORT-A-CATH;  Surgeon: [name], MD;  Location: [facility];  Service: General;  Laterality: N/A;   PORTACATH PLACEMENT N/A 11/18/2018   Procedure: INSERTION PORT-A-CATH WITH ULTRASOUND;  Surgeon: [name], MD;  Location: [facility];  Service: General;  Laterality: N/A;   REMOVAL OF BILATERAL TISSUE EXPANDERS WITH PLACEMENT OF BILATERAL BREAST IMPLANTS Bilateral 06/12/2020   Procedure: REMOVAL OF BILATERAL TISSUE EXPANDERS WITH PLACEMENT OF BILATERAL BREAST IMPLANTS;  Surgeon: Irene Limbo, MD;  Location: [facility];  Service: Plastics;  Laterality: Bilateral;   REMOVAL OF TISSUE EXPANDER AND PLACEMENT OF IMPLANT Right 06/02/2019   Procedure: REMOVAL OF TISSUE EXPANDER RIGHT CHEST;  Surgeon: Irene Limbo, MD;  Location: [facility];  Service: Plastics;   Laterality: Right;   TONSILLECTOMY     TUBAL LIGATION     WOUND DEBRIDEMENT Left 06/02/2019   Procedure: LEFT CHEST DEBRIDEMENT;  Surgeon: Irene Limbo, MD;  Location: [facility];  Service: Plastics;  Laterality: Left;    SOCIAL HISTORY: Social History   Socioeconomic History   Marital status: Married    Spouse name: Not on file   Number of children: 3   Years of education: Not on file   Highest education level: Not on file  Occupational History   Not on file  Tobacco Use   Smoking status: Former    Packs/day: 0.50    Years: 15.00    Total pack years: 7.50    Types: Cigarettes   Smokeless tobacco: Never   Tobacco comments:    Quit smoking cigarettes in 2002  Vaping Use   Vaping Use: Never used  Substance and Sexual Activity   Alcohol use: Not Currently   Drug use: No   Sexual activity: Not on file  Other Topics Concern   Not on file  Social History Narrative   Not on file   Social Determinants of Health   Financial Resource Strain: Not on file  Food Insecurity: Not on file  Transportation Needs: Not on file  Physical Activity: Not on file  Stress: Not on file  Social Connections: Not on file  Intimate Partner Violence: Not on file    FAMILY HISTORY: Family History  Problem Relation Age of Onset   Endometrial cancer Mother 44   Hearing loss Mother    Atrial fibrillation Mother    Lung cancer Father    Brain cancer Father    Heart disease Maternal Aunt    Atrial fibrillation Maternal Aunt    Lung cancer Maternal Uncle    Atrial fibrillation Maternal Uncle    Stroke Maternal Grandmother    Stroke Maternal Grandfather    Bladder Cancer Maternal Uncle    Multiple myeloma Maternal Uncle    Other Son        trisomy 13   Colon polyps Cousin    Colon cancer Neg Hx    Esophageal cancer Neg Hx    Rectal cancer Neg Hx    Stomach cancer Neg Hx     Review of Systems  Constitutional:  Positive for fatigue. Negative for appetite change,  chills, fever and unexpected weight change.  HENT:   Negative for hearing loss, lump/mass and trouble swallowing.   Eyes:  Negative for eye problems and icterus.  Respiratory:  Negative for chest tightness, cough and shortness of breath.   Cardiovascular:  Negative for chest pain, leg swelling and palpitations.  Gastrointestinal:  Negative for abdominal distention, abdominal pain, constipation, diarrhea, nausea and vomiting.  Endocrine: Negative for hot flashes.  Genitourinary:  Negative for difficulty urinating.   Musculoskeletal:  Negative for arthralgias.  Skin:  Negative for itching and rash.  Neurological:  Negative for dizziness, extremity weakness, headaches and numbness.  Hematological:  Negative for adenopathy. Does not bruise/bleed easily.  Psychiatric/Behavioral:  Negative for depression. The patient is not nervous/anxious.       PHYSICAL EXAMINATION  ECOG PERFORMANCE STATUS: 1 - Symptomatic but completely ambulatory  Vitals:   08/08/22 0928  BP: 137/72  Pulse: 60  Resp: 16  Temp: 97.9 F (36.6 C)  SpO2: 98%    Physical Exam Constitutional:      General: She is not in acute distress.    Appearance: Normal appearance. She is not toxic-appearing.  HENT:     Head: Normocephalic and atraumatic.  Eyes:     General: No scleral icterus. Cardiovascular:     Rate and Rhythm: Normal rate and regular rhythm.     Pulses: Normal pulses.     Heart sounds: Normal heart sounds.  Pulmonary:     Effort: Pulmonary effort is normal.     Breath sounds: Normal breath sounds.  Chest:     Comments: Status post bilateral mastectomies with reconstruction.  No signs of local recurrence.  No regional adenopathy.  I agree that the small notes she has been feeling in her bilateral implants are likely benign changes. Abdominal:     General: Abdomen is flat. Bowel sounds are normal. There is no distension.     Palpations: Abdomen is soft.     Tenderness: There is no abdominal tenderness.   Musculoskeletal:        General: No swelling.     Cervical back: Neck supple.  Lymphadenopathy:     Cervical: No cervical adenopathy.  Skin:    General: Skin is warm and dry.     Findings: No rash.  Neurological:     General: No focal deficit present.  Mental Status: She is alert.  Psychiatric:        Mood and Affect: Mood normal.        Behavior: Behavior normal.     LABORATORY DATA:  CBC    Component Value Date/Time   WBC 6.9 08/08/2022 0909   WBC 6.9 05/28/2022 0941   RBC 4.42 08/08/2022 0909   HGB 13.7 08/08/2022 0909   HGB 13.6 06/19/2017 1312   HCT 40.0 08/08/2022 0909   HCT 40.1 06/19/2017 1312   PLT 280 08/08/2022 0909   PLT 309 06/19/2017 1312   MCV 90.5 08/08/2022 0909   MCV 91.4 06/19/2017 1312   MCH 31.0 08/08/2022 0909   MCHC 34.3 08/08/2022 0909   RDW 12.6 08/08/2022 0909   RDW 13.5 06/19/2017 1312   LYMPHSABS 2.1 08/08/2022 0909   LYMPHSABS 2.0 06/19/2017 1312   MONOABS 0.7 08/08/2022 0909   MONOABS 0.6 06/19/2017 1312   EOSABS 0.4 08/08/2022 0909   EOSABS 0.2 06/19/2017 1312   BASOSABS 0.0 08/08/2022 0909   BASOSABS 0.1 06/19/2017 1312    CMP     Component Value Date/Time   NA 139 06/04/2022 0722   NA 138 01/09/2021 0855   NA 140 06/19/2017 1312   K 3.7 06/04/2022 0722   K 3.5 06/19/2017 1312   CL 100 06/04/2022 0722   CO2 27 06/04/2022 0722   CO2 25 06/19/2017 1312   GLUCOSE 105 (H) 06/04/2022 0722   GLUCOSE 82 06/19/2017 1312   GLUCOSE 88 09/22/2006 1056   BUN 24 (H) 06/04/2022 0722   BUN 13 01/09/2021 0855   BUN 18.4 06/19/2017 1312   CREATININE 0.79 06/04/2022 0722   CREATININE 0.80 02/08/2022 0945   CREATININE 0.8 06/19/2017 1312   CALCIUM 10.2 06/04/2022 0722   CALCIUM 10.2 06/19/2017 1312   PROT 7.5 05/28/2022 0941   PROT 7.2 03/15/2022 1004   PROT 7.7 06/19/2017 1312   ALBUMIN 4.7 05/28/2022 0941   ALBUMIN 4.6 03/15/2022 1004   ALBUMIN 4.4 06/19/2017 1312   AST 16 05/28/2022 0941   AST 17 02/08/2022 0945   AST  15 06/19/2017 1312   ALT 22 05/28/2022 0941   ALT 23 02/08/2022 0945   ALT 22 06/19/2017 1312   ALKPHOS 94 05/28/2022 0941   ALKPHOS 86 06/19/2017 1312   BILITOT 0.7 05/28/2022 0941   BILITOT 0.4 03/15/2022 1004   BILITOT 0.5 02/08/2022 0945   BILITOT 0.26 06/19/2017 1312   GFRNONAA >60 02/08/2022 0945   GFRAA >60 06/08/2020 1155   GFRAA >60 06/09/2019 1326    ASSESSMENT and THERAPY PLAN:    ASSESSMENT: 61 y.o. [place] woman status post left breast upper inner quadrant biopsy 02/08/2014 for a clinical T1c N0, stage IA invasive ductal carcinoma, grade 3, triple negative, with an MIB-1 of 50%.   (1) status post left lumpectomy and sentinel lymph node sampling 03/01/2014 for a pT1c pN0, stage IA invasive ductal carcinoma, grade 3, with negative margins, repeat prognostic panel again triple negative.   (2) adjuvant chemotherapy started a 03/25/2014 consisting of cyclophosphamide and doxorubicin given in dose dense fashion x4, completed 05/06/2014; followed by weekly paclitaxel x1, on 05/27/2014, poorly tolerated;              (a) switched to Abraxane starting 06/10/2014, with 11 Abraxane doses planned             (b) dose reduced by 20% because of neutropenia starting 07/01/2014 dose             (  c) discontinued after 4th Abraxane dose 07/15/2014 due to neuropathy   (3) adjuvant radiation completed 10/07/2014.             Left breast with breath hold technique/ 45 Gy at 1.8 Gy per fraction x 25 fractions.               Left breast boost/ 16 Gy at 2 Gy per fraction x 8 fractions   (4) genetics counseling 07/06/2015 through the Breast/Ovarian cancer gene panel offered by [facility] found no deleterious mutations in ATM, BARD1, BRCA1, BRCA2, BRIP1, CDH1, CHEK2, EPCAM, FANCC, MLH1, MSH2, MSH6, NBN, PALB2, PMS2, PTEN, RAD51C, RAD51D, TP53, and XRCC2.     (5) question of sleep apnea   (6) transverse colon lesion noted on CT scan from 06/17/2014 - negative biopsy, also showed fatty liver              (a) repeat CT scan of the abdomen and pelvis with contrast 02/15/2016 negative   RECURRENT DISEASE: February 2020 (7) biopsy of palpable mass upper inner left breast 11/10/2018 confirms invasive ductal carcinoma, grade 2 or 3, triple negative, with an MIB-1 of 80%.             (a) CT scans of the chest abdomen and pelvis with contrast showed no stage IV disease             (b) bone scan 11/13/2018 shows no definite metastasis to bone   (8) neoadjuvant chemotherapy consisting of carboplatin and gemcitabine given days 1 and 8 of each 21-day cycle, for 6 cycles, started 11/26/2018, completed 03/18/2019             (a) repeat ultrasound 01/25/2019 (after 3 cycles) shows no change in measurable breast mass              (9) bilateral mastectomies as follows              (a) left mastectomy and sentinel lymph node sampling with immediate tissue expander placement and latissimus flap 04/12/2019 showed a residual ypT1a ypN0 invasive ductal carcinoma, grade 3, with negative margins; a single sentinel lymph node was removed.  Margins were negative             (b) right mastectomy with immediate implant placement showed no malignancy             (c) right expander removed 06/02/2019 secondary to infectious complications   (10) molecular testing on the 11/10/2018 biopsy showed negative PD-L1, negative androgen receptor, stable MSI and proficient mismatch repair status.  The tumor mutational burden was low.  No PIK3 mutation was detected.  There was a pathogenic TP53 variant and negative ER PR and HER-2 were confirmed.   (11) Adjuvant pembrolizumab given every 3 weeks beginning 06/09/2019, discontinued 09/22/2019 with rash   (12) Adjuvant capecitabine 1554m PO BID for 14 days on and 7 days off on a 21 day cycle starting 06/30/2019, discontinued January/2021 due to diarrhea     PLAN:  She is here for follow up. ROS pertinent for fatigue, hypothyroidism, skin rash and some bumps in post mastectomy  area PE unremarkable, status post bilateral mastectomy with reconstruction.  There are small palpable nodules that she has been feeling closer to the surgical scar and appeared to be benign, consistent with fat necrosis. [place] to confirm that this is benign, this has been ordered by Dr WDonne Hazel Skin rash, likely allergic to some medication, I do not believe this is related to transaminitis, last  labs from Aug no concern. Total time spent: 30 min including History, Physical exam, review of records, counseling and coordination of care. RTC in one yr or sooner as needed.  All questions were answered. The patient knows to call the clinic with any problems, questions or concerns. We can certainly see the patient much sooner if necessary.  Total encounter time:30 minutes*in face-to-face visit time, chart review, lab review, care coordination, order entry, and documentation of the encounter time. *Total Encounter Time as defined by the Centers for Medicare and Medicaid Services includes, in addition to the face-to-face time of a patient visit (documented in the note above) non-face-to-face time: obtaining and reviewing outside history, ordering and reviewing medications, tests or procedures, care coordination (communications with other health care professionals or caregivers) and documentation in the medical record.

## 2022-08-16 ENCOUNTER — Other Ambulatory Visit: Payer: Medicare Other

## 2022-09-12 ENCOUNTER — Ambulatory Visit
Admission: RE | Admit: 2022-09-12 | Discharge: 2022-09-12 | Disposition: A | Discharge status: Home / Self Care | Payer: BC Managed Care – PPO | Source: Ambulatory Visit | Attending: General Surgery | Admitting: General Surgery

## 2022-09-12 DIAGNOSIS — N6310 Unspecified lump in the right breast, unspecified quadrant: Secondary | ICD-10-CM

## 2022-09-12 DIAGNOSIS — N6320 Unspecified lump in the left breast, unspecified quadrant: Secondary | ICD-10-CM

## 2022-09-16 ENCOUNTER — Encounter: Payer: Self-pay | Admitting: Nurse Practitioner

## 2022-09-16 ENCOUNTER — Ambulatory Visit (INDEPENDENT_AMBULATORY_CARE_PROVIDER_SITE_OTHER): Payer: BC Managed Care – PPO | Admitting: Nurse Practitioner

## 2022-09-16 VITALS — BP 118/78 | HR 93 | Temp 99.2°F | Ht 65.0 in | Wt 169.8 lb

## 2022-09-16 DIAGNOSIS — Z20828 Contact with and (suspected) exposure to other viral communicable diseases: Secondary | ICD-10-CM | POA: Diagnosis not present

## 2022-09-16 DIAGNOSIS — J069 Acute upper respiratory infection, unspecified: Secondary | ICD-10-CM

## 2022-09-16 LAB — POC COVID19 BINAXNOW: SARS Coronavirus 2 Ag: NEGATIVE

## 2022-09-16 LAB — POCT INFLUENZA A/B
Influenza A, POC: NEGATIVE
Influenza B, POC: NEGATIVE

## 2022-09-16 MED ORDER — ALBUTEROL SULFATE HFA 108 (90 BASE) MCG/ACT IN AERS: 1.0000 | INHALATION_SPRAY | Freq: Four times a day (QID) | RESPIRATORY_TRACT | 0 refills | Status: DC | PRN

## 2022-09-16 MED ORDER — OSELTAMIVIR PHOSPHATE 45 MG PO CAPS: 45.0000 mg | ORAL_CAPSULE | Freq: Two times a day (BID) | ORAL | 0 refills | Status: DC

## 2022-09-16 MED ORDER — PROMETHAZINE-DM 6.25-15 MG/5ML PO SYRP: 5.0000 mL | ORAL_SOLUTION | Freq: Three times a day (TID) | ORAL | 0 refills | Status: DC | PRN

## 2022-09-16 NOTE — Progress Notes (Signed)
Acute Office Visit  Subjective:    Patient ID: Alexandria Bradley, female    DOB: [birth year], 61 y.o.   MRN: [id number]  Chief Complaint  Patient presents with   Acute Visit    C/o sore throat & headache, nasal congestion, body aches, fever x 5 days     URI  This is a new problem. The current episode started yesterday. The problem has been unchanged. The maximum temperature recorded prior to her arrival was 100.4 - 100.9 F. The fever has been present for Less than 1 day. Associated symptoms include congestion, coughing, headaches, joint pain, rhinorrhea, sneezing, a sore throat and wheezing. Pertinent negatives include no diarrhea, dysuria, ear pain, joint swelling, nausea, neck pain, plugged ear sensation, rash, sinus pain, swollen glands or vomiting. She has tried NSAIDs for the symptoms. The treatment provided mild relief.  Husband tested positive for COVID last week.  Outpatient Medications Prior to Visit  Medication Sig   diltiazem (CARDIZEM CD) 240 MG 24 hr capsule Take 1 capsule (240 mg total) by mouth daily.   Levothyroxine Sodium (TIROSINT) 150 MCG CAPS Take 150 mcg by mouth daily before breakfast.   liothyronine (CYTOMEL) 5 MCG tablet Take 5 mcg by mouth daily before breakfast.    probenecid (BENEMID) 500 MG tablet TAKE 1 TABLET BY MOUTH TWICE A DAY   triamcinolone cream (KENALOG) 0.1 % Apply 1 application topically 2 (two) times daily.   triamterene-hydrochlorothiazide (MAXZIDE-25) 37.5-25 MG tablet Take 1 tablet by mouth daily.   tiZANidine (ZANAFLEX) 4 MG tablet Take 1 tablet (4 mg total) by mouth every 6 (six) hours as needed for muscle spasms. (Patient not taking: Reported on 09/16/2022)   [DISCONTINUED] Saccharomyces boulardii (PROBIOTIC) 250 MG CAPS Take 1 tablet by mouth every other day.   No facility-administered medications prior to visit.   Reviewed past medical and social history.   Review of Systems  HENT:  Positive for congestion, rhinorrhea, sneezing and  sore throat. Negative for ear pain and sinus pain.   Respiratory:  Positive for cough and wheezing.   Gastrointestinal:  Negative for diarrhea, nausea and vomiting.  Genitourinary:  Negative for dysuria.  Musculoskeletal:  Positive for joint pain. Negative for neck pain.  Skin:  Negative for rash.  Neurological:  Positive for headaches.      Objective:    Physical Exam Vitals reviewed.  Cardiovascular:     Rate and Rhythm: Normal rate.     Pulses: Normal pulses.     Heart sounds: Normal heart sounds.  Pulmonary:     Effort: Pulmonary effort is normal.     Breath sounds: Normal breath sounds.  Lymphadenopathy:     Cervical: No cervical adenopathy.  Neurological:     Mental Status: She is alert and oriented to person, place, and time.     BP 118/78 (BP Location: Right Arm, Patient Position: Sitting, Cuff Size: Small)   Pulse 93   Temp 99.2 F (37.3 C) (Temporal)   Ht '5\' 5"'$  (1.651 m)   Wt 169 lb 12.8 oz (77 kg)   SpO2 95%   BMI 28.26 kg/m    Results for orders placed or performed in visit on 09/16/22  POC COVID-19  Result Value Ref Range   SARS Coronavirus 2 Ag Negative Negative  POCT Influenza A/B  Result Value Ref Range   Influenza A, POC Negative Negative   Influenza B, POC Negative Negative      Assessment & Plan:   Problem List Items Addressed  This Visit   None Visit Diagnoses     Viral upper respiratory tract infection    -  Primary   Relevant Medications   promethazine-dextromethorphan (PROMETHAZINE-DM) 6.25-15 MG/5ML syrup   oseltamivir (TAMIFLU) 45 MG capsule   albuterol (VENTOLIN HFA) 108 (90 Base) MCG/ACT inhaler   Other Relevant Orders   POC COVID-19 (Completed)   POCT Influenza A/B (Completed)   Exposure to influenza       Relevant Medications   promethazine-dextromethorphan (PROMETHAZINE-DM) 6.25-15 MG/5ML syrup   oseltamivir (TAMIFLU) 45 MG capsule   albuterol (VENTOLIN HFA) 108 (90 Base) MCG/ACT inhaler      Meds ordered this  encounter  Medications   promethazine-dextromethorphan (PROMETHAZINE-DM) 6.25-15 MG/5ML syrup    Sig: Take 5 mLs by mouth every 8 (eight) hours as needed for cough.    Dispense:  180 mL    Refill:  0    Order Specific Question:   Supervising Provider    Answer:   Libby Maw [[id number]]   oseltamivir (TAMIFLU) 45 MG capsule    Sig: Take 1 capsule (45 mg total) by mouth 2 (two) times daily.    Dispense:  10 capsule    Refill:  0    Order Specific Question:   Supervising Provider    Answer:   Abelino Derrick ALFRED [[id number]]   albuterol (VENTOLIN HFA) 108 (90 Base) MCG/ACT inhaler    Sig: Inhale 1-2 puffs into the lungs every 6 (six) hours as needed for wheezing or shortness of breath.    Dispense:  8 g    Refill:  0    Order Specific Question:   Supervising Provider    Answer:   Libby Maw [[id number]]   Return if symptoms worsen or fail to improve.    Wilfred Lacy, NP

## 2022-09-16 NOTE — Patient Instructions (Signed)
Start tamiflu Encourage adequate oral hydration. You can alternate promethazine Dm with mucinex DM every 4hrs as needed for cough Use albuterol prn for wheezing or Shortness of breath or chest tightness. You can use plain "Tylenol" or "Advil" for fever, chills and achyness. Call office if no improvement in 5days

## 2022-09-18 ENCOUNTER — Ambulatory Visit (INDEPENDENT_AMBULATORY_CARE_PROVIDER_SITE_OTHER): Payer: BC Managed Care – PPO

## 2022-09-18 DIAGNOSIS — Z0000 Encounter for general adult medical examination without abnormal findings: Secondary | ICD-10-CM | POA: Diagnosis not present

## 2022-09-18 NOTE — Patient Instructions (Signed)
Alexandria Bradley , Thank you for taking time to come for your Medicare Wellness Visit. I appreciate your ongoing commitment to your health goals. Please review the following plan we discussed and let me know if I can assist you in the future.   These are the goals we discussed:  Goals      Patient Stated     None at this time         This is a list of the screening recommended for you and due dates:  Health Maintenance  Topic Date Due   Yearly kidney health urinalysis for diabetes  Never done   COVID-19 Vaccine (3 - Pfizer risk series) 11/28/2022*   Flu Shot  12/29/2022*   Pap Smear  05/29/2023   Yearly kidney function blood test for diabetes  08/09/2023   Medicare Annual Wellness Visit  09/19/2023   Colon Cancer Screening  03/28/2027   DTaP/Tdap/Td vaccine (2 - Td or Tdap) 05/28/2032   Hepatitis C Screening: USPSTF Recommendation to screen - Ages 16-79 yo.  Completed   HIV Screening  Completed   HPV Vaccine  Aged Out   Complete foot exam   Discontinued   Mammogram  Discontinued   Hemoglobin A1C  Discontinued   Eye exam for diabetics  Discontinued   Zoster (Shingles) Vaccine  Discontinued  *Topic was postponed. The date shown is not the original due date.    Advanced directives: Please bring a copy of your health care power of attorney and living will to the office at your convenience.  Conditions/risks identified: none at this time   Next appointment: Follow up in one year for your annual wellness visit.   Preventive Care 40-64 Years, Female Preventive care refers to lifestyle choices and visits with your health care provider that can promote health and wellness. What does preventive care include? A yearly physical exam. This is also called an annual well check. Dental exams once or twice a year. Routine eye exams. Ask your health care provider how often you should have your eyes checked. Personal lifestyle choices, including: Daily care of your teeth and gums. Regular  physical activity. Eating a healthy diet. Avoiding tobacco and drug use. Limiting alcohol use. Practicing safe sex. Taking low-dose aspirin daily starting at age 74. Taking vitamin and mineral supplements as recommended by your health care provider. What happens during an annual well check? The services and screenings done by your health care provider during your annual well check will depend on your age, overall health, lifestyle risk factors, and family history of disease. Counseling  Your health care provider may ask you questions about your: Alcohol use. Tobacco use. Drug use. Emotional well-being. Home and relationship well-being. Sexual activity. Eating habits. Work and work Statistician. Method of birth control. Menstrual cycle. Pregnancy history. Screening  You may have the following tests or measurements: Height, weight, and BMI. Blood pressure. Lipid and cholesterol levels. These may be checked every 5 years, or more frequently if you are over 3 years old. Skin check. Lung cancer screening. You may have this screening every year starting at age 78 if you have a 30-pack-year history of smoking and currently smoke or have quit within the past 15 years. Fecal occult blood test (FOBT) of the stool. You may have this test every year starting at age 71. Flexible sigmoidoscopy or colonoscopy. You may have a sigmoidoscopy every 5 years or a colonoscopy every 10 years starting at age 46. Hepatitis C blood test. Hepatitis B blood test.  Sexually transmitted disease (STD) testing. Diabetes screening. This is done by checking your blood sugar (glucose) after you have not eaten for a while (fasting). You may have this done every 1-3 years. Mammogram. This may be done every 1-2 years. Talk to your health care provider about when you should start having regular mammograms. This may depend on whether you have a family history of breast cancer. BRCA-related cancer screening. This may be  done if you have a family history of breast, ovarian, tubal, or peritoneal cancers. Pelvic exam and Pap test. This may be done every 3 years starting at age 61. Starting at age 74, this may be done every 5 years if you have a Pap test in combination with an HPV test. Bone density scan. This is done to screen for osteoporosis. You may have this scan if you are at high risk for osteoporosis. Discuss your test results, treatment options, and if necessary, the need for more tests with your health care provider. Vaccines  Your health care provider may recommend certain vaccines, such as: Influenza vaccine. This is recommended every year. Tetanus, diphtheria, and acellular pertussis (Tdap, Td) vaccine. You may need a Td booster every 10 years. Zoster vaccine. You may need this after age 59. Pneumococcal 13-valent conjugate (PCV13) vaccine. You may need this if you have certain conditions and were not previously vaccinated. Pneumococcal polysaccharide (PPSV23) vaccine. You may need one or two doses if you smoke cigarettes or if you have certain conditions. Talk to your health care provider about which screenings and vaccines you need and how often you need them. This information is not intended to replace advice given to you by your health care provider. Make sure you discuss any questions you have with your health care provider. Document Released: 10/13/2015 Document Revised: 06/05/2016 Document Reviewed: 07/18/2015 Elsevier Interactive Patient Education  2017 [employer] Prevention in the Home Falls can cause injuries. They can happen to people of all ages. There are many things you can do to make your home safe and to help prevent falls. What can I do on the outside of my home? Regularly fix the edges of walkways and driveways and fix any cracks. Remove anything that might make you trip as you walk through a door, such as a raised step or threshold. Trim any bushes or trees on the path  to your home. Use bright outdoor lighting. Clear any walking paths of anything that might make someone trip, such as rocks or tools. Regularly check to see if handrails are loose or broken. Make sure that both sides of any steps have handrails. Any raised decks and porches should have guardrails on the edges. Have any leaves, snow, or ice cleared regularly. Use sand or salt on walking paths during winter. Clean up any spills in your garage right away. This includes oil or grease spills. What can I do in the bathroom? Use night lights. Install grab bars by the toilet and in the tub and shower. Do not use towel bars as grab bars. Use non-skid mats or decals in the tub or shower. If you need to sit down in the shower, use a plastic, non-slip stool. Keep the floor dry. Clean up any water that spills on the floor as soon as it happens. Remove soap buildup in the tub or shower regularly. Attach bath mats securely with double-sided non-slip rug tape. Do not have throw rugs and other things on the floor that can make you trip.  What can I do in the bedroom? Use night lights. Make sure that you have a light by your bed that is easy to reach. Do not use any sheets or blankets that are too big for your bed. They should not hang down onto the floor. Have a firm chair that has side arms. You can use this for support while you get dressed. Do not have throw rugs and other things on the floor that can make you trip. What can I do in the kitchen? Clean up any spills right away. Avoid walking on wet floors. Keep items that you use a lot in easy-to-reach places. If you need to reach something above you, use a strong step stool that has a grab bar. Keep electrical cords out of the way. Do not use floor polish or wax that makes floors slippery. If you must use wax, use non-skid floor wax. Do not have throw rugs and other things on the floor that can make you trip. What can I do with my stairs? Do not  leave any items on the stairs. Make sure that there are handrails on both sides of the stairs and use them. Fix handrails that are broken or loose. Make sure that handrails are as long as the stairways. Check any carpeting to make sure that it is firmly attached to the stairs. Fix any carpet that is loose or worn. Avoid having throw rugs at the top or bottom of the stairs. If you do have throw rugs, attach them to the floor with carpet tape. Make sure that you have a light switch at the top of the stairs and the bottom of the stairs. If you do not have them, ask someone to add them for you. What else can I do to help prevent falls? Wear shoes that: Do not have high heels. Have rubber bottoms. Are comfortable and fit you well. Are closed at the toe. Do not wear sandals. If you use a stepladder: Make sure that it is fully opened. Do not climb a closed stepladder. Make sure that both sides of the stepladder are locked into place. Ask someone to hold it for you, if possible. Clearly mark and make sure that you can see: Any grab bars or handrails. First and last steps. Where the edge of each step is. Use tools that help you move around (mobility aids) if they are needed. These include: Canes. Walkers. Scooters. Crutches. Turn on the lights when you go into a dark area. Replace any light bulbs as soon as they burn out. Set up your furniture so you have a clear path. Avoid moving your furniture around. If any of your floors are uneven, fix them. If there are any pets around you, be aware of where they are. Review your medicines with your doctor. Some medicines can make you feel dizzy. This can increase your chance of falling. Ask your doctor what other things that you can do to help prevent falls. This information is not intended to replace advice given to you by your health care provider. Make sure you discuss any questions you have with your health care provider. Document Released:  07/13/2009 Document Revised: 02/22/2016 Document Reviewed: 10/21/2014 Elsevier Interactive Patient Education  2017 [employer].

## 2022-09-18 NOTE — Progress Notes (Addendum)
I connected with  Alexandria Bradley on 09/18/22 by a audio enabled telemedicine application and verified that I am speaking with the correct person using two identifiers.  Patient Location: Home  Provider Location: Home Office  I discussed the limitations of evaluation and management by telemedicine. The patient expressed understanding and agreed to proceed.   Subjective:   Alexandria Bradley is a 61 y.o. female who presents for an Initial Medicare Annual Wellness Visit.  Review of Systems     Cardiac Risk Factors include: advanced age (>38mn, >>44women);hypertension;dyslipidemia     Objective:    There were no vitals filed for this visit. There is no height or weight on file to calculate BMI.     09/18/2022    2:41 PM 08/08/2020    9:16 AM 06/12/2020   10:37 AM 06/06/2020   12:55 PM 03/12/2020   12:31 PM 01/24/2020    6:31 AM 01/13/2020   12:19 PM  Advanced Directives  Does Patient Have a Medical Advance Directive? Yes _0  No  Type of AParamedicof AShannonLiving will        Copy of HThonotosassain Chart? No - copy requested        Would patient like information on creating a medical advance directive?  No - Patient declined No - Patient declined No - Patient declined No - Patient declined No - Patient declined No - Patient declined    Current Medications (verified) Outpatient Encounter Medications as of 09/18/2022  Medication Sig   albuterol (VENTOLIN HFA) 108 (90 Base) MCG/ACT inhaler Inhale 1-2 puffs into the lungs every 6 (six) hours as needed for wheezing or shortness of breath.   diltiazem (CARDIZEM CD) 240 MG 24 hr capsule Take 1 capsule (240 mg total) by mouth daily.   Levothyroxine Sodium (TIROSINT) 150 MCG CAPS Take 150 mcg by mouth daily before breakfast.   liothyronine (CYTOMEL) 5 MCG tablet Take 5 mcg by mouth daily before breakfast.    oseltamivir (TAMIFLU) 45 MG capsule Take 1 capsule (45 mg total) by mouth 2  (two) times daily.   probenecid (BENEMID) 500 MG tablet TAKE 1 TABLET BY MOUTH TWICE A DAY   triamcinolone cream (KENALOG) 0.1 % Apply 1 application topically 2 (two) times daily.   triamterene-hydrochlorothiazide (MAXZIDE-25) 37.5-25 MG tablet Take 1 tablet by mouth daily.   promethazine-dextromethorphan (PROMETHAZINE-DM) 6.25-15 MG/5ML syrup Take 5 mLs by mouth every 8 (eight) hours as needed for cough. (Patient not taking: Reported on 09/18/2022)   [DISCONTINUED] prochlorperazine (COMPAZINE) 10 MG tablet Take 1 tablet (10 mg total) by mouth every 6 (six) hours as needed (Nausea or vomiting).   [DISCONTINUED] tiZANidine (ZANAFLEX) 4 MG tablet Take 1 tablet (4 mg total) by mouth every 6 (six) hours as needed for muscle spasms. (Patient not taking: Reported on 09/16/2022)   No facility-administered encounter medications on file as of 09/18/2022.    Allergies (verified) Chlorhexidine, Ciprofloxacin, Prednisone, Codeine, Cortisone, and Colchicine   History: Past Medical History:  Diagnosis Date   Anxiety    Arthritis    Back pain    Breast cancer ([place]    left   Fatty liver    GERD (gastroesophageal reflux disease)    Headache(784.0)    PMH : migraines   History of kidney stones    Hypercholesterolemia    Hypertension    Hypothyroidism    Osteoporosis    Personal history of chemotherapy    Personal  history of radiation therapy    PONV (postoperative nausea and vomiting)    difficult to wake up   Pre-diabetes    Psoriasis    PVC's (premature ventricular contractions)    Radiation 08/18/14-10/07/14   Left breast   Sleep apnea    no CPAP   Past Surgical History:  Procedure Laterality Date   BREAST BIOPSY Left 11/10/2018   BREAST LUMPECTOMY Left 2015   BREAST LUMPECTOMY WITH AXILLARY LYMPH NODE BIOPSY  6/15   left   BREAST RECONSTRUCTION WITH PLACEMENT OF TISSUE EXPANDER AND ALLODERM Bilateral 04/12/2019   Procedure: BILATERAL BREAST RECONSTRUCTION WITH PLACEMENT OF TISSUE  EXPANDERS; ALLODERM TO RIGHT CHEST;  Surgeon: Irene Limbo, MD;  Location: [facility];  Service: Plastics;  Laterality: Bilateral;   BREAST RECONSTRUCTION WITH PLACEMENT OF TISSUE EXPANDER AND ALLODERM Right 01/24/2020   Procedure: BREAST RECONSTRUCTION WITH PLACEMENT OF TISSUE EXPANDER AND ALLODERM;  Surgeon: Irene Limbo, MD;  Location: [facility];  Service: Plastics;  Laterality: Right;   CARDIAC CATHETERIZATION     CHOLECYSTECTOMY     colon polyps     COLONOSCOPY N/A 06/27/2014   Procedure: COLONOSCOPY;  Surgeon: Lear Ng, MD;  Location: [facility];  Service: Endoscopy;  Laterality: N/A;   LATISSIMUS FLAP TO BREAST Left 04/12/2019   Procedure: LEFT LATISSIMUS DORSI FLAP TO LEFT CHEST;  Surgeon: Irene Limbo, MD;  Location: [facility];  Service: Plastics;  Laterality: Left;   LEFT HEART CATH AND CORONARY ANGIOGRAPHY N/A 07/24/2018   Procedure: LEFT HEART CATH AND CORONARY ANGIOGRAPHY;  Surgeon: Troy Sine, MD;  Location: [facility];  Service: Cardiovascular;  Laterality: N/A;   LIPOMA EXCISION     LIPOSUCTION WITH LIPOFILLING Bilateral 06/12/2020   Procedure: LIPOFILLING TO BILATERAL CHEST;  Surgeon: Irene Limbo, MD;  Location: [facility];  Service: Plastics;  Laterality: Bilateral;   MASTECTOMY W/ SENTINEL NODE BIOPSY Bilateral 04/12/2019   Procedure: RIGHT BREAST RISK REDUCING MASTECTOMY; LEFT MASTECTOMY WITH LEFT AXILLARY SENTINEL LYMPH NODE BIOPSY WITH BLUE DYE INJECTION;  Surgeon: [name], MD;  Location: [facility];  Service: General;  Laterality: Bilateral;   MYOMECTOMY     PORT-A-CATH REMOVAL N/A 02/03/2015   Procedure: REMOVAL PORT-A-CATH;  Surgeon: [name], MD;  Location: [facility];  Service: General;  Laterality: N/A;   PORT-A-CATH REMOVAL Right 01/24/2020   Procedure: REMOVAL PORT-A-CATH;  Surgeon: Irene Limbo, MD;  Location: [facility];  Service: Plastics;  Laterality:  Right;   PORTACATH PLACEMENT N/A 03/01/2014   Procedure: INSERTION PORT-A-CATH;  Surgeon: [name], MD;  Location: [facility];  Service: General;  Laterality: N/A;   PORTACATH PLACEMENT N/A 11/18/2018   Procedure: INSERTION PORT-A-CATH WITH ULTRASOUND;  Surgeon: [name], MD;  Location: [facility];  Service: General;  Laterality: N/A;   REMOVAL OF BILATERAL TISSUE EXPANDERS WITH PLACEMENT OF BILATERAL BREAST IMPLANTS Bilateral 06/12/2020   Procedure: REMOVAL OF BILATERAL TISSUE EXPANDERS WITH PLACEMENT OF BILATERAL BREAST IMPLANTS;  Surgeon: Irene Limbo, MD;  Location: [facility];  Service: Plastics;  Laterality: Bilateral;   REMOVAL OF TISSUE EXPANDER AND PLACEMENT OF IMPLANT Right 06/02/2019   Procedure: REMOVAL OF TISSUE EXPANDER RIGHT CHEST;  Surgeon: Irene Limbo, MD;  Location: [facility];  Service: Plastics;  Laterality: Right;   TONSILLECTOMY     TUBAL LIGATION     WOUND DEBRIDEMENT Left 06/02/2019   Procedure: LEFT CHEST DEBRIDEMENT;  Surgeon: Irene Limbo, MD;  Location: [facility];  Service: Plastics;  Laterality: Left;   Family History  Problem Relation Age of Onset   Endometrial cancer Mother 2   Hearing loss Mother    Atrial fibrillation Mother    Lung cancer Father    Brain cancer Father    Heart disease Maternal Aunt    Atrial fibrillation Maternal Aunt    Lung cancer Maternal Uncle    Atrial fibrillation Maternal Uncle    Stroke Maternal Grandmother    Stroke Maternal Grandfather    Bladder Cancer Maternal Uncle    Multiple myeloma Maternal Uncle    Other Son        trisomy 24   Colon polyps Cousin    Colon cancer Neg Hx    Esophageal cancer Neg Hx    Rectal cancer Neg Hx    Stomach cancer Neg Hx    Social History   Socioeconomic History   Marital status: Married    Spouse name: Not on file   Number of children: 3   Years of education: Not on file   Highest education level:  Not on file  Occupational History   Not on file  Tobacco Use   Smoking status: Former    Packs/day: 0.50    Years: 15.00    Total pack years: 7.50    Types: Cigarettes   Smokeless tobacco: Never   Tobacco comments:    Quit smoking cigarettes in 2002  Vaping Use   Vaping Use: Never used  Substance and Sexual Activity   Alcohol use: Not Currently   Drug use: No   Sexual activity: Not on file  Other Topics Concern   Not on file  Social History Narrative   Not on file   Social Determinants of Health   Financial Resource Strain: Low Risk  (09/18/2022)   Overall Financial Resource Strain (CARDIA)    Difficulty of Paying Living Expenses: Not hard at all  Food Insecurity: No Food Insecurity (09/18/2022)   Hunger Vital Sign    Worried About Running Out of Food in the Last Year: Never true    Ran Out of Food in the Last Year: Never true  Transportation Needs: No Transportation Needs (09/18/2022)   PRAPARE - Hydrologist (Medical): No    Lack of Transportation (Non-Medical): No  Physical Activity: Inactive (09/18/2022)   Exercise Vital Sign    Days of Exercise per Week: 0 days    Minutes of Exercise per Session: 0 min  Stress: No Stress Concern Present (09/18/2022)   [place]    Feeling of Stress : Not at all  Social Connections: Moderately Integrated (09/18/2022)   Social Connection and Isolation Panel [NHANES]    Frequency of Communication with Friends and Family: Three times a week    Frequency of Social Gatherings with Friends and Family: Three times a week    Attends Religious Services: More than 4 times per year    Active Member of Clubs or Organizations: No    Attends Archivist Meetings: Never    Marital Status: Married    Tobacco Counseling Counseling given: Not Answered Tobacco comments: Quit smoking cigarettes in 2002   Clinical Intake:  Pre-visit  preparation completed: Yes  Pain : No/denies pain     BMI - recorded: 28.26 Nutritional Status: BMI 25 -29 Overweight Nutritional Risks: None Diabetes: No  How often do you need to have someone help you when  you read instructions, pamphlets, or other written materials from your doctor or pharmacy?: 1 - Never  Diabetic?no  Interpreter Needed?: No  Information entered by :: Charlott Rakes, LPN   Activities of Daily Living    09/18/2022    2:42 PM  In your present state of health, do you have any difficulty performing the following activities:  Hearing? 0  Vision? 0  Difficulty concentrating or making decisions? 0  Walking or climbing stairs? 0  Dressing or bathing? 0  Doing errands, shopping? 0  Preparing Food and eating ? N  Using the Toilet? N  In the past six months, have you accidently leaked urine? Y  Comment with coughing  Do you have problems with loss of bowel control? N  Managing your Medications? N  Managing your Finances? N  Housekeeping or managing your Housekeeping? N    Patient Care Team: [name], DO as PCP - General (Family Medicine) Josue Hector, MD as PCP - Cardiology (Cardiology) Brien Few, MD as Consulting Physician (Obstetrics and Gynecology) [name], MD as Consulting Physician (General Surgery) Delrae Rend, MD as Consulting Physician (Endocrinology) Dorna Leitz, MD as Consulting Physician (Orthopedic Surgery) Josue Hector, MD as Consulting Physician (Cardiology) Irene Limbo, MD as Consulting Physician (Plastic Surgery) Ladene Artist, MD as Consulting Physician (Gastroenterology)  Indicate any recent Medical Services you may have received from other than [facility] providers in the past year (date may be approximate).     Assessment:   This is a routine wellness examination for [place].  Hearing/Vision screen Hearing Screening - Comments:: Pt denies any hearing issues  Vision Screening -  Comments:: Pt follows up with [facility] for ann[facility] eye exams   Dietary issues and exercise activities discussed: Current Exercise Habits: The patient does not participate in regular exercise at present   Goals Addressed             This Visit's Progress    Patient Stated       None at this time        Depression Screen    09/18/2022    2:39 PM 05/28/2022    9:02 AM 03/21/2021   10:54 AM 01/24/2021    9:51 AM 08/04/2014   10:05 AM  PHQ 2/9 Scores  PHQ - 2 Score 0 0 0 0 0  PHQ- 9 Score  0 3      Fall Risk    09/18/2022    2:41 PM 05/28/2022    9:02 AM 03/21/2021   10:54 AM 08/04/2014   10:05 AM 07/22/2014    9:13 AM  Fall Risk   Falls in the past year? 1 0 0 Yes Yes  Number falls in past yr: 1 0 0 1 1  Injury with Fall? 0 0 0  No  Risk for fall due to : Impaired vision;Impaired balance/gait No Fall Risks No Fall Risks Impaired balance/gait   Risk for fall due to: Comment vertigo   neuropathy   Follow up Falls prevention discussed Falls evaluation completed Falls evaluation completed      [place]:  Any stairs in or around the home? Yes  If so, are there any without handrails? No  Home free of loose throw rugs in walkways, pet beds, electrical cords, etc? Yes  Adequate lighting in your home to reduce risk of falls? Yes   ASSISTIVE DEVICES UTILIZED TO PREVENT FALLS:  Life alert? No  Use of a cane,  walker or w/c? No  Grab bars in the bathroom? Yes  Shower chair or bench in shower? No  Elevated toilet seat or a handicapped toilet? No   TIMED UP AND GO:  Was the test performed? No .   Cognitive Function:        09/18/2022    2:43 PM  6CIT Screen  What Year? 0 points  What month? 0 points  What time? 0 points  Count back from 20 0 points  Months in reverse 0 points  Repeat phrase 0 points  Total Score 0 points    Immunizations Immunization History  Administered Date(s) Administered   PFIZER(Purple  Top)SARS-COV-2 Vaccination 02/14/2020, 03/06/2020   Tdap 05/28/2022   Zoster Recombinat (Shingrix) 05/30/2018    TDAP status: Up to date  Flu Vaccine status: Due, Education has been provided regarding the importance of this vaccine. Advised may receive this vaccine at local pharmacy or Health Dept. Aware to provide a copy of the vaccination record if obtained from local pharmacy or Health Dept. Verbalized acceptance and understanding.    Covid-19 vaccine status: Completed vaccines  Qualifies for Shingles Vaccine? Yes   Zostavax completed Yes   Shingrix Completed?: Yes  Screening Tests Health Maintenance  Topic Date Due   Diabetic kidney evaluation - Urine ACR  Never done   COVID-19 Vaccine (3 - Pfizer risk series) 11/28/2022 (Originally 04/03/2020)   INFLUENZA VACCINE  12/29/2022 (Originally 04/30/2022)   PAP SMEAR-Modifier  05/29/2023   Diabetic kidney evaluation - eGFR measurement  08/09/2023   Medicare Annual Wellness (AWV)  09/19/2023   COLONOSCOPY (Pts 45-65yr Insurance coverage will need to be confirmed)  03/28/2027   DTaP/Tdap/Td (2 - Td or Tdap) 05/28/2032   Hepatitis C Screening  Completed   HIV Screening  Completed   HPV VACCINES  Aged Out   FOOT EXAM  Discontinued   MAMMOGRAM  Discontinued   HEMOGLOBIN A1C  Discontinued   OPHTHALMOLOGY EXAM  Discontinued   Zoster Vaccines- Shingrix  Discontinued    Health Maintenance  Health Maintenance Due  Topic Date Due   Diabetic kidney evaluation - Urine ACR  Never done    Colorectal cancer screening: Type of screening: Colonoscopy. Completed 03/27/20. Repeat every 7 years  Mammogram status: No longer required due to HX.  Additional Screening:  Hepatitis C Screening: ; Completed 01/06/2008  Vision Screening: Recommended annual ophthalmology exams for early detection of glaucoma and other disorders of the eye. Is the patient up to date with their annual eye exam?  Yes  Who is the provider or what is the name of the  office in which the patient attends annual eye exams? [facility]  If pt is not established with a provider, would they like to be referred to a provider to establish care? No .   Dental Screening: Recommended annual dental exams for proper oral hygiene  Community Resource Referral / Chronic Care Management: CRR required this visit?  No   CCM required this visit?  No      Plan:     I have personally reviewed and noted the following in the patient's chart:   Medical and social history Use of alcohol, tobacco or illicit drugs  Current medications and supplements including opioid prescriptions. Patient is not currently taking opioid prescriptions. Functional ability and status Nutritional status Physical activity Advanced directives List of other physicians Hospitalizations, surgeries, and ER visits in previous 12 months Vitals Screenings to include cognitive, depression, and falls Referrals and appointments  In  addition, I have reviewed and discussed with patient certain preventive protocols, quality metrics, and best practice recommendations. A written personalized care plan for preventive services as well as general preventive health recommendations were provided to patient.     Willette Brace, LPN   [id number]   Nurse Notes: none

## 2022-09-24 ENCOUNTER — Ambulatory Visit
Admission: EM | Admit: 2022-09-24 | Discharge: 2022-09-24 | Disposition: A | Discharge status: Home / Self Care | Payer: BC Managed Care – PPO | Attending: Emergency Medicine | Admitting: Emergency Medicine

## 2022-09-24 ENCOUNTER — Ambulatory Visit: Payer: BC Managed Care – PPO

## 2022-09-24 ENCOUNTER — Ambulatory Visit (INDEPENDENT_AMBULATORY_CARE_PROVIDER_SITE_OTHER): Payer: BC Managed Care – PPO

## 2022-09-24 ENCOUNTER — Ambulatory Visit: Payer: Medicare Other

## 2022-09-24 DIAGNOSIS — J029 Acute pharyngitis, unspecified: Secondary | ICD-10-CM | POA: Insufficient documentation

## 2022-09-24 DIAGNOSIS — J020 Streptococcal pharyngitis: Secondary | ICD-10-CM | POA: Diagnosis not present

## 2022-09-24 DIAGNOSIS — J36 Peritonsillar abscess: Secondary | ICD-10-CM | POA: Diagnosis present

## 2022-09-24 DIAGNOSIS — J189 Pneumonia, unspecified organism: Secondary | ICD-10-CM

## 2022-09-24 LAB — POCT RAPID STREP A (OFFICE): Rapid Strep A Screen: NEGATIVE

## 2022-09-24 MED ORDER — GUAIFENESIN 400 MG PO TABS: ORAL_TABLET | ORAL | 0 refills | Status: DC

## 2022-09-24 MED ORDER — PROMETHAZINE-DM 6.25-15 MG/5ML PO SYRP: 5.0000 mL | ORAL_SOLUTION | Freq: Four times a day (QID) | ORAL | 0 refills | Status: DC | PRN

## 2022-09-24 MED ORDER — ALBUTEROL SULFATE HFA 108 (90 BASE) MCG/ACT IN AERS: 2.0000 | INHALATION_SPRAY | Freq: Four times a day (QID) | RESPIRATORY_TRACT | 0 refills | Status: AC | PRN

## 2022-09-24 MED ORDER — AZITHROMYCIN 250 MG PO TABS: ORAL_TABLET | ORAL | 0 refills | Status: AC

## 2022-09-24 MED ORDER — ACETAMINOPHEN 325 MG PO TABS
650.0000 mg | ORAL_TABLET | Freq: Once | ORAL | Status: AC
  Administered 2022-09-24: 650 mg via ORAL

## 2022-09-24 MED ORDER — AMOXICILLIN-POT CLAVULANATE 875-125 MG PO TABS: 1.0000 | ORAL_TABLET | Freq: Two times a day (BID) | ORAL | 0 refills | Status: AC

## 2022-09-24 NOTE — Discharge Instructions (Addendum)
Based on my interpretation of your chest x-ray, I believe that you have pneumonia in your left and right lower lobes.  The radiology report is still pending and when it is completed we will posted to your MyChart.  Based my physical exam findings, I also believe that you have an abscess in your peritonsillar area of the left side of your throat.  I believe that the antibiotics that I am going to be prescribing treat you for pneumonia will also take care of the abscess.  Please read below to learn more about the medications, dosages and frequencies that I recommend to help alleviate your symptoms and to get you feeling better soon:   Augmentin (amoxicillin - clavulanic acid):  take 1 tablet twice daily for 7 days, you can take it with or without food.  This antibiotic can cause upset stomach, this will resolve once antibiotics are complete.  You are welcome to use a probiotic, eat yogurt, take Imodium while taking this medication.  Please avoid other systemic medications such as Maalox, Pepto-Bismol or milk of magnesia as they can interfere with your body's ability to absorb the antibiotics.  Z-Pak (azithromycin):  take 2 tablets the first day, then take 1 tablet daily every day thereafter until complete.       ProAir, Ventolin, Proventil (albuterol): This inhaled medication contains a short acting beta agonist bronchodilator.  This medication works on the smooth muscle that opens and constricts of your airways by relaxing the muscle.  The result of relaxation of the smooth muscle is increased air movement and improved work of breathing.  This is a short acting medication that can be used every 4-6 hours as needed for increased work of breathing, shortness of breath, wheezing and excessive coughing.  I have provided you with a prescription.    Robitussin, Mucinex (guaifenesin): This is an expectorant.  This helps break up chest congestion and loosen up thick nasal drainage making phlegm and drainage  more liquid and therefore easier to remove.  I recommend being 400 mg three times daily as needed.      Promethazine DM: Promethazine is both a nasal decongestant and an antinausea medication that makes most patients feel fairly sleepy.  The DM is dextromethorphan, a cough suppressant found in many over-the-counter cough medications.  Please take 5 mL before bedtime to minimize your cough which will help you sleep better.  I have sent a prescription for this medication to your pharmacy.  Follow-up in the next 3 to 5 days if you do not see meaningful improvement of your symptoms.  The above medication recommendations may need to be adjusted.  Please do consider sharing your opinion about our current appointment system.  Unlike many other places that I have worked in the past, I have found that [facility] administration is very responsive to input from our patients.  Thank you for visiting urgent care today.  I appreciate your patience and understanding and I do apologize for your very, unnecessarily long wait.

## 2022-09-24 NOTE — ED Triage Notes (Signed)
Pt present sore throat with chills, symptoms started two days ago.

## 2022-09-24 NOTE — ED Provider Notes (Signed)
[facility]    CSN: [id number] Arrival date & time: 09/24/22  1323    HISTORY   Chief Complaint  Patient presents with   Sore Throat    Multiple symptoms.  Fever, green discharge nose and cough up. Lymph nodes hurt on left side. Stomachache  chills and bodyaches. - Entered by patient   HPI Alexandria Bradley is a pleasant, 61 y.o. female who presents to urgent care today. Patient complains of sore throat with chills, patient states her symptoms began 2 days ago.  Patient states she is actually been sick for several weeks, completed a course of Tamiflu for exposure to influenza from her husband, states she tested negative herself.  Patient states she was also prescribed albuterol and Promethazine DM, states the albuterol helps a little bit as does Promethazine DM.  Patient states the lymph nodes on the left side of her neck and her firm and tender to palpation.  Patient states she has got a stomachache, chills and bodyaches.  Patient states she is having green discharge from her nose as well as cough productive of green sputum for the past two days.    Patient reported being very frustrated by having to wait over 4 hours due to being unable to schedule appointment here today.  Patient stated multiple times that she felt "penalized" for being a walk-in patient, stated "you need to rethink your system" multiple times.  The history is provided by the patient.   Past Medical History:  Diagnosis Date   Anxiety    Arthritis    Back pain    Breast cancer ([place])    left   Fatty liver    GERD (gastroesophageal reflux disease)    Headache(784.0)    PMH : migraines   History of kidney stones    Hypercholesterolemia    Hypertension    Hypothyroidism    Osteoporosis    Personal history of chemotherapy    Personal history of radiation therapy    PONV (postoperative nausea and vomiting)    difficult to wake up   Pre-diabetes    Psoriasis    PVC's (premature ventricular  contractions)    Radiation 08/18/14-10/07/14   Left breast   Sleep apnea    no CPAP   Patient Active Problem List   Diagnosis Date Noted   Chronic gout involving toe of left foot without tophus 05/11/2021   Recurrent cancer of left breast ([place]) 11/13/2018   Neuropathy due to chemotherapeutic drug ([place]) 11/13/2018   Prediabetes 05/28/2018   OSA (obstructive sleep apnea) 08/20/2016   Adjustment disorder with mixed anxiety and depressed mood 08/20/2016   Genetic testing 07/07/2015   Family history of uterine cancer    Family history of bladder cancer    Fatty infiltration of liver 07/08/2014   Chemotherapy-induced nausea 06/10/2014   Malignant neoplasm of upper-inner quadrant of left breast in female, estrogen receptor negative ([place]) 02/11/2014   HLD (hyperlipidemia) 05/11/2010   PSORIASIS 05/11/2010   Nonspecific (abnormal) findings on radiological and other examination of body structure 05/11/2010   CHEST XRAY, ABNORMAL 05/11/2010   Hypothyroidism 05/10/2010   Essential hypertension 09/02/2007   Allergic rhinitis, cause unspecified 09/02/2007   Past Surgical History:  Procedure Laterality Date   BREAST BIOPSY Left 11/10/2018   BREAST LUMPECTOMY Left 2015   BREAST LUMPECTOMY WITH AXILLARY LYMPH NODE BIOPSY  6/15   left   BREAST RECONSTRUCTION WITH PLACEMENT OF TISSUE EXPANDER AND ALLODERM Bilateral 04/12/2019   Procedure: BILATERAL BREAST RECONSTRUCTION  WITH PLACEMENT OF TISSUE EXPANDERS; ALLODERM TO RIGHT CHEST;  Surgeon: Irene Limbo, MD;  Location: [place];  Service: Plastics;  Laterality: Bilateral;   BREAST RECONSTRUCTION WITH PLACEMENT OF TISSUE EXPANDER AND ALLODERM Right 01/24/2020   Procedure: BREAST RECONSTRUCTION WITH PLACEMENT OF TISSUE EXPANDER AND ALLODERM;  Surgeon: Irene Limbo, MD;  Location: [place];  Service: Plastics;  Laterality: Right;   CARDIAC CATHETERIZATION     CHOLECYSTECTOMY     colon polyps     COLONOSCOPY N/A 06/27/2014    Procedure: COLONOSCOPY;  Surgeon: Lear Ng, MD;  Location: [place];  Service: Endoscopy;  Laterality: N/A;   LATISSIMUS FLAP TO BREAST Left 04/12/2019   Procedure: LEFT LATISSIMUS DORSI FLAP TO LEFT CHEST;  Surgeon: Irene Limbo, MD;  Location: [place];  Service: Plastics;  Laterality: Left;   LEFT HEART CATH AND CORONARY ANGIOGRAPHY N/A 07/24/2018   Procedure: LEFT HEART CATH AND CORONARY ANGIOGRAPHY;  Surgeon: Troy Sine, MD;  Location: [place] [facility];  Service: Cardiovascular;  Laterality: N/A;   LIPOMA EXCISION     LIPOSUCTION WITH LIPOFILLING Bilateral 06/12/2020   Procedure: LIPOFILLING TO BILATERAL CHEST;  Surgeon: Irene Limbo, MD;  Location: [place];  Service: Plastics;  Laterality: Bilateral;   MASTECTOMY W/ SENTINEL NODE BIOPSY Bilateral 04/12/2019   Procedure: RIGHT BREAST RISK REDUCING MASTECTOMY; LEFT MASTECTOMY WITH LEFT AXILLARY SENTINEL LYMPH NODE BIOPSY WITH BLUE DYE INJECTION;  Surgeon: [name], MD;  Location: [place];  Service: General;  Laterality: Bilateral;   MYOMECTOMY     PORT-A-CATH REMOVAL N/A 02/03/2015   Procedure: REMOVAL PORT-A-CATH;  Surgeon: [name], MD;  Location: [place];  Service: General;  Laterality: N/A;   PORT-A-CATH REMOVAL Right 01/24/2020   Procedure: REMOVAL PORT-A-CATH;  Surgeon: Irene Limbo, MD;  Location: [place];  Service: Plastics;  Laterality: Right;   PORTACATH PLACEMENT N/A 03/01/2014   Procedure: INSERTION PORT-A-CATH;  Surgeon: [name], MD;  Location: [place];  Service: General;  Laterality: N/A;   PORTACATH PLACEMENT N/A 11/18/2018   Procedure: INSERTION PORT-A-CATH WITH ULTRASOUND;  Surgeon: [name], MD;  Location: [place];  Service: General;  Laterality: N/A;   REMOVAL OF BILATERAL TISSUE EXPANDERS WITH PLACEMENT OF BILATERAL BREAST IMPLANTS Bilateral 06/12/2020   Procedure: REMOVAL OF BILATERAL TISSUE  EXPANDERS WITH PLACEMENT OF BILATERAL BREAST IMPLANTS;  Surgeon: Irene Limbo, MD;  Location: [place];  Service: Plastics;  Laterality: Bilateral;   REMOVAL OF TISSUE EXPANDER AND PLACEMENT OF IMPLANT Right 06/02/2019   Procedure: REMOVAL OF TISSUE EXPANDER RIGHT CHEST;  Surgeon: Irene Limbo, MD;  Location: [place];  Service: Plastics;  Laterality: Right;   TONSILLECTOMY     TUBAL LIGATION     WOUND DEBRIDEMENT Left 06/02/2019   Procedure: LEFT CHEST DEBRIDEMENT;  Surgeon: Irene Limbo, MD;  Location: [place];  Service: Plastics;  Laterality: Left;   OB History   No obstetric history on file.    Home Medications    Prior to Admission medications   Medication Sig Start Date End Date Taking? Authorizing Provider  albuterol (VENTOLIN HFA) 108 (90 Base) MCG/ACT inhaler Inhale 1-2 puffs into the lungs every 6 (six) hours as needed for wheezing or shortness of breath. 09/16/22   Nche, Charlene Brooke, NP  diltiazem (CARDIZEM CD) 240 MG 24 hr capsule Take 1 capsule (240 mg total) by mouth daily. 04/17/22   Josue Hector, MD  Levothyroxine Sodium ([place])  150 MCG CAPS Take 150 mcg by mouth daily before breakfast.    [provider]  liothyronine (CYTOMEL) 5 MCG tablet Take 5 mcg by mouth daily before breakfast.  11/13/13   [provider]  oseltamivir (TAMIFLU) 45 MG capsule Take 1 capsule (45 mg total) by mouth 2 (two) times daily. 09/16/22   Nche, Charlene Brooke, NP  probenecid (BENEMID) 500 MG tablet TAKE 1 TABLET BY MOUTH TWICE A DAY 07/17/22   [name], DO  promethazine-dextromethorphan (PROMETHAZINE-DM) 6.25-15 MG/5ML syrup Take 5 mLs by mouth every 8 (eight) hours as needed for cough. Patient not taking: Reported on 09/18/2022 09/16/22   Nche, Charlene Brooke, NP  triamcinolone cream (KENALOG) 0.1 % Apply 1 application topically 2 (two) times daily. 11/26/21   [name], DO   triamterene-hydrochlorothiazide (MAXZIDE-25) 37.5-25 MG tablet Take 1 tablet by mouth daily. 05/13/22   Josue Hector, MD  prochlorperazine (COMPAZINE) 10 MG tablet Take 1 tablet (10 mg total) by mouth every 6 (six) hours as needed (Nausea or vomiting). 11/13/18 05/11/19  Magrinat, Virgie Dad, MD    Family History Family History  Problem Relation Age of Onset   Endometrial cancer Mother 56   Hearing loss Mother    Atrial fibrillation Mother    Lung cancer Father    Brain cancer Father    Heart disease Maternal Aunt    Atrial fibrillation Maternal Aunt    Lung cancer Maternal Uncle    Atrial fibrillation Maternal Uncle    Stroke Maternal Grandmother    Stroke Maternal Grandfather    Bladder Cancer Maternal Uncle    Multiple myeloma Maternal Uncle    Other Son        trisomy 13   Colon polyps Cousin    Colon cancer Neg Hx    Esophageal cancer Neg Hx    Rectal cancer Neg Hx    Stomach cancer Neg Hx    Social History Social History   Tobacco Use   Smoking status: Former    Packs/day: 0.50    Years: 15.00    Total pack years: 7.50    Types: Cigarettes   Smokeless tobacco: Never   Tobacco comments:    Quit smoking cigarettes in 2002  Vaping Use   Vaping Use: Never used  Substance Use Topics   Alcohol use: Not Currently   Drug use: No   Allergies   Chlorhexidine, Ciprofloxacin, Prednisone, Codeine, Cortisone, and Colchicine  Review of Systems Review of Systems Pertinent findings revealed after performing a 14 point review of systems has been noted in the history of present illness.  Physical Exam Triage Vital Signs ED Triage Vitals  Enc Vitals Group     BP 07/27/21 0827 (!) 147/82     Pulse Rate 07/27/21 0827 72     Resp 07/27/21 0827 18     Temp 07/27/21 0827 98.3 F (36.8 C)     Temp Source 07/27/21 0827 Oral     SpO2 07/27/21 0827 98 %     Weight --      Height --      Head Circumference --      Peak Flow --      Pain Score 07/27/21 0826 5     Pain Loc  --      Pain Edu? --      Excl. in [place]? --   No data found.  Updated Vital Signs BP 137/75 (BP Location: Right Arm)   Pulse (!) 113  Temp 97.7 F (36.5 C) (Axillary) Comment: pt recently drank water  Resp 18   SpO2 96%   Physical Exam Vitals and nursing note reviewed.  Constitutional:      General: She is awake. She is not in acute distress.    Appearance: Normal appearance. She is well-developed and well-groomed. She is ill-appearing.  HENT:     Head: Normocephalic and atraumatic.     Salivary Glands: Right salivary gland is not diffusely enlarged or tender. Left salivary gland is diffusely enlarged and tender.     Right Ear: Hearing, tympanic membrane, ear canal and external ear normal. No drainage. No middle ear effusion. There is no impacted cerumen. Tympanic membrane is not erythematous or bulging.     Left Ear: Hearing, tympanic membrane, ear canal and external ear normal. No drainage.  No middle ear effusion. There is no impacted cerumen. Tympanic membrane is not erythematous or bulging.     Nose: Rhinorrhea present. No nasal deformity, septal deviation, signs of injury, nasal tenderness, mucosal edema or congestion. Rhinorrhea is clear.     Right Turbinates: Not enlarged, swollen or pale.     Left Turbinates: Not enlarged, swollen or pale.     Right Sinus: No maxillary sinus tenderness or frontal sinus tenderness.     Left Sinus: No maxillary sinus tenderness or frontal sinus tenderness.     Mouth/Throat:     Lips: Pink. No lesions.     Mouth: Mucous membranes are moist. No oral lesions.     Tongue: No lesions. Tongue does not deviate from midline.     Palate: No mass and lesions.     Pharynx: Oropharynx is clear. Uvula midline. No pharyngeal swelling, oropharyngeal exudate, posterior oropharyngeal erythema or uvula swelling.     Tonsils: No tonsillar exudate. 0 on the right. 0 on the left.  Eyes:     General: Lids are normal.        Right eye: No discharge.        Left  eye: No discharge.     Extraocular Movements: Extraocular movements intact.     Conjunctiva/sclera: Conjunctivae normal.     Right eye: Right conjunctiva is not injected.     Left eye: Left conjunctiva is not injected.     Pupils: Pupils are equal, round, and reactive to light.  Neck:     Thyroid: No thyroid mass.     Trachea: Trachea and phonation normal.  Cardiovascular:     Rate and Rhythm: Regular rhythm. Tachycardia present.     Pulses: Normal pulses.     Heart sounds: Normal heart sounds, S1 normal and S2 normal. No murmur heard.    No systolic murmur is present.     No diastolic murmur is present.     No friction rub. No gallop. No S3 or S4 sounds.  Pulmonary:     Effort: Pulmonary effort is normal. No tachypnea, bradypnea, accessory muscle usage, prolonged expiration, respiratory distress or retractions.     Breath sounds: No stridor, decreased air movement or transmitted upper airway sounds. Examination of the left-lower field reveals decreased breath sounds and rales. Decreased breath sounds and rales present. No wheezing or rhonchi.  Chest:     Chest wall: No tenderness.  Musculoskeletal:        General: Normal range of motion.     Cervical back: Full passive range of motion without pain, normal range of motion and neck supple. No edema, erythema or rigidity. No pain with movement  or muscular tenderness. Normal range of motion.  Lymphadenopathy:     Head:     Left side of head: Submental, submandibular and tonsillar adenopathy present.     Cervical: Cervical adenopathy present.     Left cervical: Superficial cervical adenopathy, deep cervical adenopathy and posterior cervical adenopathy present.  Skin:    General: Skin is warm and dry.     Findings: No erythema or rash.  Neurological:     General: No focal deficit present.     Mental Status: She is alert and oriented to person, place, and time.  Psychiatric:        Mood and Affect: Mood normal.        Behavior:  Behavior normal. Behavior is cooperative.     Visual Acuity Right Eye Distance:   Left Eye Distance:   Bilateral Distance:    Right Eye Near:   Left Eye Near:    Bilateral Near:     [facility] / Diagnostics / Procedures:     Radiology DG Chest 2 View  Result Date: 09/24/2022 CLINICAL DATA:  Decreased breath sounds in the lower lung fields. Sore throat and chills. EXAM: CHEST - 2 VIEW COMPARISON:  Chest x-ray November 18, 2018 FINDINGS: Haziness over the lung bases favored to represent overlapping breast tissue. The heart, hila, mediastinum, lungs, and pleura are otherwise unremarkable. IMPRESSION: Haziness over the lung bases favored to represent overlapping breast tissue. No other abnormalities. Electronically Signed   By: Dorise Bullion III M.D.   On: 09/24/2022 18:44    Procedures Procedures (including critical care time) EKG  Pending results:  Labs Reviewed  CULTURE, GROUP A STREP [facility])  POCT RAPID STREP A (OFFICE)    Medications Ordered in UC: Medications  acetaminophen (TYLENOL) tablet 650 mg (650 mg Oral Given 09/24/22 1650)    UC Diagnoses / Final Clinical Impressions(s)   I have reviewed the triage vital signs and the nursing notes.  Pertinent labs & imaging results that were available during my care of the patient were reviewed by me and considered in my medical decision making (see chart for details).    Final diagnoses:  Acute pharyngitis, unspecified etiology  Community acquired bilateral lower lobe pneumonia  Peritonsillar abscess   Patient advised of x-ray findings.  Recommend dual treatment with Augmentin and azithromycin given duration of symptoms.  Supportive medications advised.  Return precautions advised.  Conservative care recommended.  Patient encouraged to follow-up with patient experience regarding her excessively long wait due to our current system of allowing patients to schedule appointments ahead of time while still encouraging patients to  walk in and wait to be seen.   Rapid strep test today is negative, throat culture to confirm this result is pending.  Will adjust treatment as needed based on results of throat culture.  Please see discharge instructions below for further details of plan of care as provided to patient. ED Prescriptions     Medication Sig Dispense Auth. Provider   amoxicillin-clavulanate (AUGMENTIN) 875-125 MG tablet Take 1 tablet by mouth 2 (two) times daily for 7 days. 14 tablet Lynden Oxford Scales, PA-C   azithromycin (ZITHROMAX) 250 MG tablet Take 2 tablets (500 mg total) by mouth daily for 1 day, THEN 1 tablet (250 mg total) daily for 4 days. 6 tablet Lynden Oxford Scales, PA-C   guaifenesin (HUMIBID E) 400 MG TABS tablet Take 1 tablet 3 times daily as needed for chest congestion and cough 21 tablet Lynden Oxford Scales, PA-C  promethazine-dextromethorphan (PROMETHAZINE-DM) 6.25-15 MG/5ML syrup Take 5 mLs by mouth 4 (four) times daily as needed for cough. 118 mL Lynden Oxford Scales, PA-C   albuterol (VENTOLIN HFA) 108 (90 Base) MCG/ACT inhaler Inhale 2 puffs into the lungs every 6 (six) hours as needed for wheezing or shortness of breath (Cough). 18 g Lynden Oxford Scales, PA-C      PDMP not reviewed this encounter.  Disposition Upon Discharge:  Condition: stable for discharge home Home: take medications as prescribed; routine discharge instructions as discussed; follow up as advised.  Patient presented with an acute illness with associated systemic symptoms and significant discomfort requiring urgent management. In my opinion, this is a condition that a prudent lay person (someone who possesses an average knowledge of health and medicine) may potentially expect to result in complications if not addressed urgently such as respiratory distress, impairment of bodily function or dysfunction of bodily organs.   Routine symptom specific, illness specific and/or disease specific instructions were  discussed with the patient and/or caregiver at length.   As such, the patient has been evaluated and assessed, work-up was performed and treatment was provided in alignment with urgent care protocols and evidence based medicine.  Patient/parent/caregiver has been advised that the patient may require follow up for further testing and treatment if the symptoms continue in spite of treatment, as clinically indicated and appropriate.  If the patient was tested for COVID-19, Influenza and/or RSV, then the patient/parent/guardian was advised to isolate at home pending the results of his/her diagnostic coronavirus test and potentially longer if they're positive. I have also advised pt that if his/her COVID-19 test returns positive, it's recommended to self-isolate for at least 10 days after symptoms first appeared AND until fever-free for 24 hours without fever reducer AND other symptoms have improved or resolved. Discussed self-isolation recommendations as well as instructions for household member/close contacts as per the [facility] and [place] DHHS, and also gave patient the [place] packet with this information.  Patient/parent/caregiver has been advised to return to the [facility] or PCP in 3-5 days if no better; to PCP or the Emergency Department if new signs and symptoms develop, or if the current signs or symptoms continue to change or worsen for further workup, evaluation and treatment as clinically indicated and appropriate  The patient will follow up with their current PCP if and as advised. If the patient does not currently have a PCP we will assist them in obtaining one.   The patient may need specialty follow up if the symptoms continue, in spite of conservative treatment and management, for further workup, evaluation, consultation and treatment as clinically indicated and appropriate.  Patient/parent/caregiver verbalized understanding and agreement of plan as discussed.  All questions were addressed during visit.   Please see discharge instructions below for further details of plan.  Discharge Instructions:   Discharge Instructions      Based on my interpretation of your chest x-ray, I believe that you have pneumonia in your left and right lower lobes.  The radiology report is still pending and when it is completed we will posted to your MyChart.  Based my physical exam findings, I also believe that you have an abscess in your peritonsillar area of the left side of your throat.  I believe that the antibiotics that I am going to be prescribing treat you for pneumonia will also take care of the abscess.  Please read below to learn more about the medications, dosages and frequencies that I recommend to help  alleviate your symptoms and to get you feeling better soon:   Augmentin (amoxicillin - clavulanic acid):  take 1 tablet twice daily for 7 days, you can take it with or without food.  This antibiotic can cause upset stomach, this will resolve once antibiotics are complete.  You are welcome to use a probiotic, eat yogurt, take Imodium while taking this medication.  Please avoid other systemic medications such as Maalox, Pepto-Bismol or milk of magnesia as they can interfere with your body's ability to absorb the antibiotics.  Z-Pak (azithromycin):  take 2 tablets the first day, then take 1 tablet daily every day thereafter until complete.       ProAir, Ventolin, Proventil (albuterol): This inhaled medication contains a short acting beta agonist bronchodilator.  This medication works on the smooth muscle that opens and constricts of your airways by relaxing the muscle.  The result of relaxation of the smooth muscle is increased air movement and improved work of breathing.  This is a short acting medication that can be used every 4-6 hours as needed for increased work of breathing, shortness of breath, wheezing and excessive coughing.  I have provided you with a prescription.    Robitussin, Mucinex  (guaifenesin): This is an expectorant.  This helps break up chest congestion and loosen up thick nasal drainage making phlegm and drainage more liquid and therefore easier to remove.  I recommend being 400 mg three times daily as needed.      Promethazine DM: Promethazine is both a nasal decongestant and an antinausea medication that makes most patients feel fairly sleepy.  The DM is dextromethorphan, a cough suppressant found in many over-the-counter cough medications.  Please take 5 mL before bedtime to minimize your cough which will help you sleep better.  I have sent a prescription for this medication to your pharmacy.  Follow-up in the next 3 to 5 days if you do not see meaningful improvement of your symptoms.  The above medication recommendations may need to be adjusted.  Please do consider sharing your opinion about our current appointment system.  Unlike many other places that I have worked in the past, I have found that [facility] administration is very responsive to input from our patients.  Thank you for visiting urgent care today.  I appreciate your patience and understanding and I do apologize for your very, unnecessarily long wait.       This office note has been dictated using Museum/gallery curator.  Unfortunately, this method of dictation can sometimes lead to typographical or grammatical errors.  I apologize for your inconvenience in advance if this occurs.  Please do not hesitate to reach out to me if clarification is needed.      Lynden Oxford Scales, PA-C 09/26/22 1410

## 2022-09-27 LAB — CULTURE, GROUP A STREP (THRC)

## 2022-10-04 ENCOUNTER — Encounter: Payer: Self-pay | Admitting: Family

## 2022-10-04 ENCOUNTER — Ambulatory Visit (INDEPENDENT_AMBULATORY_CARE_PROVIDER_SITE_OTHER): Payer: BC Managed Care – PPO | Admitting: Family

## 2022-10-04 VITALS — BP 118/78 | HR 65 | Temp 98.0°F | Resp 18 | Ht 65.0 in | Wt 170.8 lb

## 2022-10-04 DIAGNOSIS — J209 Acute bronchitis, unspecified: Secondary | ICD-10-CM | POA: Diagnosis not present

## 2022-10-04 DIAGNOSIS — R590 Localized enlarged lymph nodes: Secondary | ICD-10-CM | POA: Diagnosis not present

## 2022-10-04 MED ORDER — DOXYCYCLINE HYCLATE 100 MG PO TABS: 100.0000 mg | ORAL_TABLET | Freq: Two times a day (BID) | ORAL | 0 refills | Status: DC

## 2022-10-04 NOTE — Progress Notes (Signed)
Alexandria Bradley is a 62 y.o. female with the following history as recorded in [employer]:  Patient Active Problem List   Diagnosis Date Noted   Chronic gout involving toe of left foot without tophus 05/11/2021   Recurrent cancer of left breast ([place]) 11/13/2018   Neuropathy due to chemotherapeutic drug ([place]) 11/13/2018   Prediabetes 05/28/2018   OSA (obstructive sleep apnea) 08/20/2016   Adjustment disorder with mixed anxiety and depressed mood 08/20/2016   Genetic testing 07/07/2015   Family history of uterine cancer    Family history of bladder cancer    Fatty infiltration of liver 07/08/2014   Chemotherapy-induced nausea 06/10/2014   Malignant neoplasm of upper-inner quadrant of left breast in female, estrogen receptor negative ([place]) 02/11/2014   HLD (hyperlipidemia) 05/11/2010   PSORIASIS 05/11/2010   Nonspecific (abnormal) findings on radiological and other examination of body structure 05/11/2010   CHEST XRAY, ABNORMAL 05/11/2010   Hypothyroidism 05/10/2010   Essential hypertension 09/02/2007   Allergic rhinitis, cause unspecified 09/02/2007    Current Outpatient Medications  Medication Sig Dispense Refill   albuterol (VENTOLIN HFA) 108 (90 Base) MCG/ACT inhaler Inhale 2 puffs into the lungs every 6 (six) hours as needed for wheezing or shortness of breath (Cough). 18 g 0   diltiazem (CARDIZEM CD) 240 MG 24 hr capsule Take 1 capsule (240 mg total) by mouth daily. 90 capsule 3   doxycycline (VIBRA-TABS) 100 MG tablet Take 1 tablet (100 mg total) by mouth 2 (two) times daily. 14 tablet 0   guaifenesin (HUMIBID E) 400 MG TABS tablet Take 1 tablet 3 times daily as needed for chest congestion and cough 21 tablet 0   Levothyroxine Sodium (TIROSINT) 150 MCG CAPS Take 150 mcg by mouth daily before breakfast.     liothyronine (CYTOMEL) 5 MCG tablet Take 5 mcg by mouth daily before breakfast.      probenecid (BENEMID) 500 MG tablet TAKE 1 TABLET BY MOUTH TWICE A DAY 60 tablet 2    promethazine-dextromethorphan (PROMETHAZINE-DM) 6.25-15 MG/5ML syrup Take 5 mLs by mouth 4 (four) times daily as needed for cough. 118 mL 0   triamcinolone cream (KENALOG) 0.1 % Apply 1 application topically 2 (two) times daily. 60 g 0   triamterene-hydrochlorothiazide (MAXZIDE-25) 37.5-25 MG tablet Take 1 tablet by mouth daily. 90 tablet 3   oseltamivir (TAMIFLU) 45 MG capsule Take 1 capsule (45 mg total) by mouth 2 (two) times daily. (Patient not taking: Reported on 10/04/2022) 10 capsule 0   No current facility-administered medications for this visit.    Allergies: Chlorhexidine, Ciprofloxacin, Prednisone, Codeine, Cortisone, and Colchicine  Past Medical History:  Diagnosis Date   Anxiety    Arthritis    Back pain    Breast cancer ([place])    left   Fatty liver    GERD (gastroesophageal reflux disease)    Headache(784.0)    PMH : migraines   History of kidney stones    Hypercholesterolemia    Hypertension    Hypothyroidism    Osteoporosis    Personal history of chemotherapy    Personal history of radiation therapy    PONV (postoperative nausea and vomiting)    difficult to wake up   Pre-diabetes    Psoriasis    PVC's (premature ventricular contractions)    Radiation 08/18/14-10/07/14   Left breast   Sleep apnea    no CPAP    Past Surgical History:  Procedure Laterality Date   BREAST BIOPSY Left 11/10/2018   BREAST LUMPECTOMY Left  2015   BREAST LUMPECTOMY WITH AXILLARY LYMPH NODE BIOPSY  6/15   left   BREAST RECONSTRUCTION WITH PLACEMENT OF TISSUE EXPANDER AND ALLODERM Bilateral 04/12/2019   Procedure: BILATERAL BREAST RECONSTRUCTION WITH PLACEMENT OF TISSUE EXPANDERS; ALLODERM TO RIGHT CHEST;  Surgeon: Irene Limbo, MD;  Location: [place];  Service: Plastics;  Laterality: Bilateral;   BREAST RECONSTRUCTION WITH PLACEMENT OF TISSUE EXPANDER AND ALLODERM Right 01/24/2020   Procedure: BREAST RECONSTRUCTION WITH PLACEMENT OF TISSUE EXPANDER AND ALLODERM;  Surgeon: Irene Limbo, MD;  Location: [place];  Service: Plastics;  Laterality: Right;   CARDIAC CATHETERIZATION     CHOLECYSTECTOMY     colon polyps     COLONOSCOPY N/A 06/27/2014   Procedure: COLONOSCOPY;  Surgeon: Lear Ng, MD;  Location: [facility];  Service: Endoscopy;  Laterality: N/A;   LATISSIMUS FLAP TO BREAST Left 04/12/2019   Procedure: LEFT LATISSIMUS DORSI FLAP TO LEFT CHEST;  Surgeon: Irene Limbo, MD;  Location: [place] [facility];  Service: Plastics;  Laterality: Left;   LEFT HEART CATH AND CORONARY ANGIOGRAPHY N/A 07/24/2018   Procedure: LEFT HEART CATH AND CORONARY ANGIOGRAPHY;  Surgeon: Troy Sine, MD;  Location: [facility];  Service: Cardiovascular;  Laterality: N/A;   LIPOMA EXCISION     LIPOSUCTION WITH LIPOFILLING Bilateral 06/12/2020   Procedure: LIPOFILLING TO BILATERAL CHEST;  Surgeon: Irene Limbo, MD;  Location: [place];  Service: Plastics;  Laterality: Bilateral;   MASTECTOMY W/ SENTINEL NODE BIOPSY Bilateral 04/12/2019   Procedure: RIGHT BREAST RISK REDUCING MASTECTOMY; LEFT MASTECTOMY WITH LEFT AXILLARY SENTINEL LYMPH NODE BIOPSY WITH BLUE DYE INJECTION;  Surgeon: [name], MD;  Location: [facility];  Service: General;  Laterality: Bilateral;   MYOMECTOMY     PORT-A-CATH REMOVAL N/A 02/03/2015   Procedure: REMOVAL PORT-A-CATH;  Surgeon: [name], MD;  Location: [place];  Service: General;  Laterality: N/A;   PORT-A-CATH REMOVAL Right 01/24/2020   Procedure: REMOVAL PORT-A-CATH;  Surgeon: Irene Limbo, MD;  Location: [place];  Service: Plastics;  Laterality: Right;   PORTACATH PLACEMENT N/A 03/01/2014   Procedure: INSERTION PORT-A-CATH;  Surgeon: [name], MD;  Location: [place];  Service: General;  Laterality: N/A;   PORTACATH PLACEMENT N/A 11/18/2018   Procedure: INSERTION PORT-A-CATH WITH ULTRASOUND;  Surgeon: [name], MD;  Location: [place];  Service: General;  Laterality: N/A;   REMOVAL OF BILATERAL TISSUE EXPANDERS WITH PLACEMENT OF BILATERAL BREAST IMPLANTS Bilateral 06/12/2020   Procedure: REMOVAL OF BILATERAL TISSUE EXPANDERS WITH PLACEMENT OF BILATERAL BREAST IMPLANTS;  Surgeon: Irene Limbo, MD;  Location: [place];  Service: Plastics;  Laterality: Bilateral;   REMOVAL OF TISSUE EXPANDER AND PLACEMENT OF IMPLANT Right 06/02/2019   Procedure: REMOVAL OF TISSUE EXPANDER RIGHT CHEST;  Surgeon: Irene Limbo, MD;  Location: [facility];  Service: Plastics;  Laterality: Right;   TONSILLECTOMY     TUBAL LIGATION     WOUND DEBRIDEMENT Left 06/02/2019   Procedure: LEFT CHEST DEBRIDEMENT;  Surgeon: Irene Limbo, MD;  Location: [place];  Service: Plastics;  Laterality: Left;    Family History  Problem Relation Age of Onset   Endometrial cancer Mother 47   Hearing loss Mother    Atrial fibrillation Mother    Lung cancer Father    Brain cancer Father    Heart disease Maternal Aunt    Atrial fibrillation Maternal Aunt    Lung cancer Maternal Uncle  Atrial fibrillation Maternal Uncle    Stroke Maternal Grandmother    Stroke Maternal Grandfather    Bladder Cancer Maternal Uncle    Multiple myeloma Maternal Uncle    Other Son        trisomy 40   Colon polyps Cousin    Colon cancer Neg Hx    Esophageal cancer Neg Hx    Rectal cancer Neg Hx    Stomach cancer Neg Hx     Social History   Tobacco Use   Smoking status: Former    Packs/day: 0.50    Years: 15.00    Total pack years: 7.50    Types: Cigarettes   Smokeless tobacco: Never   Tobacco comments:    Quit smoking cigarettes in 2002  Substance Use Topics   Alcohol use: Not Currently    Subjective:   Developed sore throat in early December after dental cleaning; was treated for flu in mid-December; then got "really sick" the day after Christmas and was seen [facility]; concerned for possible pneumonia but  CXR was clear; was told that she may have peritonsillar abscess as well; patient notes that there was marked swelling on left side of her neck at that appointment at [facility]; however, tonsils were removed when she was a child; notes she is actually feeling better today but is concerned because her neck continues to be sore; she is also continuing to have episodes of productive cough- "still bringing up a lot of green stuff especially in the mornings."    Objective:  Vitals:   10/04/22 1314  BP: 118/78  Pulse: 65  Resp: 18  Temp: 98 F (36.7 C)  TempSrc: Oral  SpO2: 98%  Weight: 170 lb 12.8 oz (77.5 kg)  Height: '5\' 5"'$  (1.651 m)    General: Well developed, well nourished, in no acute distress  Skin : Warm and dry.  Head: Normocephalic and atraumatic  Eyes: Sclera and conjunctiva clear; pupils round and reactive to light; extraocular movements intact  Ears: External normal; canals clear; tympanic membranes normal  Oropharynx: Pink, supple. No suspicious lesions  Neck: Supple without thyromegaly, adenopathy  Lungs: Respirations unlabored; clear to auscultation bilaterally without wheeze, rales, rhonchi  CVS exam: normal rate and regular rhythm.  Neurologic: Alert and oriented; speech intact; face symmetrical; moves all extremities well; CNII-XII intact without focal deficit   Assessment:  1. Lymphadenopathy, cervical   2. Acute bronchitis, unspecified organism     Plan:  Patient had presumed normal CXR after [facility] visit on 12/26- did show haziness but thought to be breast tissue; due to continued cough/ congestion, will re-treat with Doxycycline 100 mg bid x 7 days; history of neck swelling and continued neck pain are concerning especially since patient's tonsils have been removed; will update neck ultrasound; follow up to be determined;   No follow-ups on file.  Orders Placed This Encounter  Procedures   US Soft Tissue Head/Neck (NON-THYROID)    Standing Status:   Future    Standing  Expiration Date:   10/05/2023    Order Specific Question:   Reason for Exam (SYMPTOM  OR DIAGNOSIS REQUIRED)    Answer:   enlarged lymph node    Order Specific Question:   Preferred imaging location?    Answer:   [facility]    Requested Prescriptions   Signed Prescriptions Disp Refills   doxycycline (VIBRA-TABS) 100 MG tablet 14 tablet 0    Sig: Take 1 tablet (100 mg total) by mouth 2 (  two) times daily.

## 2022-10-12 ENCOUNTER — Other Ambulatory Visit: Payer: Self-pay | Admitting: Family Medicine

## 2022-10-12 DIAGNOSIS — M1A9XX0 Chronic gout, unspecified, without tophus (tophi): Secondary | ICD-10-CM

## 2022-10-21 ENCOUNTER — Encounter: Payer: Self-pay | Admitting: Family

## 2022-10-23 ENCOUNTER — Telehealth (HOSPITAL_BASED_OUTPATIENT_CLINIC_OR_DEPARTMENT_OTHER): Payer: Self-pay

## 2022-10-23 ENCOUNTER — Ambulatory Visit (INDEPENDENT_AMBULATORY_CARE_PROVIDER_SITE_OTHER): Payer: BC Managed Care – PPO | Admitting: Family Medicine

## 2022-10-23 ENCOUNTER — Encounter: Payer: Self-pay | Admitting: Family Medicine

## 2022-10-23 VITALS — BP 122/74 | HR 83 | Temp 97.5°F | Resp 16 | Ht 65.0 in | Wt 171.2 lb

## 2022-10-23 DIAGNOSIS — H8111 Benign paroxysmal vertigo, right ear: Secondary | ICD-10-CM | POA: Diagnosis not present

## 2022-10-23 MED ORDER — PREDNISONE 20 MG PO TABS: 40.0000 mg | ORAL_TABLET | Freq: Every day | ORAL | 0 refills | Status: AC

## 2022-10-23 NOTE — Progress Notes (Signed)
Chief Complaint  Patient presents with   Follow-up    Follow up    Alexandria Bradley is 62 y.o. pt here for dizziness.  Duration: 5 days Pass out? No, felt like she was going to on  one occasion Spinning? Yes Recent illness/fever? Yes; URI s/s's for ~6 weeks Headache? No Neurologic signs? No Change in PO intake? No Palpitations? No  Past Medical History:  Diagnosis Date   Anxiety    Arthritis    Back pain    Breast cancer ([facility])    left   Fatty liver    GERD (gastroesophageal reflux disease)    Headache(784.0)    PMH : migraines   History of kidney stones    Hypercholesterolemia    Hypertension    Hypothyroidism    Osteoporosis    Personal history of chemotherapy    Personal history of radiation therapy    PONV (postoperative nausea and vomiting)    difficult to wake up   Pre-diabetes    Psoriasis    PVC's (premature ventricular contractions)    Radiation 08/18/14-10/07/14   Left breast   Sleep apnea    no CPAP    Family History  Problem Relation Age of Onset   Endometrial cancer Mother 70   Hearing loss Mother    Atrial fibrillation Mother    Lung cancer Father    Brain cancer Father    Heart disease Maternal Aunt    Atrial fibrillation Maternal Aunt    Lung cancer Maternal Uncle    Atrial fibrillation Maternal Uncle    Stroke Maternal Grandmother    Stroke Maternal Grandfather    Bladder Cancer Maternal Uncle    Multiple myeloma Maternal Uncle    Other Son        trisomy 13   Colon polyps Cousin    Colon cancer Neg Hx    Esophageal cancer Neg Hx    Rectal cancer Neg Hx    Stomach cancer Neg Hx     Allergies as of 10/23/2022       Reactions   Chlorhexidine Itching   Ciprofloxacin Anxiety, Rash, Other (See Comments)   GI upset   Prednisone Shortness Of Breath, Other (See Comments)   "jacks me up" jittery   Codeine Nausea Only   Cortisone Swelling, Other (See Comments)   "jack me up"   Colchicine Nausea Only        Medication List         Accurate as of October 23, 2022  2:31 PM. If you have any questions, ask your nurse or doctor.          albuterol 108 (90 Base) MCG/ACT inhaler Commonly known as: VENTOLIN HFA Inhale 2 puffs into the lungs every 6 (six) hours as needed for wheezing or shortness of breath (Cough).   diltiazem 240 MG 24 hr capsule Commonly known as: CARDIZEM CD Take 1 capsule (240 mg total) by mouth daily.   doxycycline 100 MG tablet Commonly known as: VIBRA-TABS Take 1 tablet (100 mg total) by mouth 2 (two) times daily.   guaifenesin 400 MG Tabs tablet Commonly known as: HUMIBID E Take 1 tablet 3 times daily as needed for chest congestion and cough   liothyronine 5 MCG tablet Commonly known as: CYTOMEL Take 5 mcg by mouth daily before breakfast.   oseltamivir 45 MG capsule Commonly known as: Tamiflu Take 1 capsule (45 mg total) by mouth 2 (two) times daily.   predniSONE 20 MG tablet Commonly  known as: DELTASONE Take 2 tablets (40 mg total) by mouth daily with breakfast for 5 days. Started by: [name], DO   probenecid 500 MG tablet Commonly known as: BENEMID TAKE 1 TABLET BY MOUTH TWICE A DAY   promethazine-dextromethorphan 6.25-15 MG/5ML syrup Commonly known as: PROMETHAZINE-DM Take 5 mLs by mouth 4 (four) times daily as needed for cough.   Tirosint 150 MCG Caps Generic drug: Levothyroxine Sodium Take 150 mcg by mouth daily before breakfast.   triamcinolone cream 0.1 % Commonly known as: KENALOG Apply 1 application topically 2 (two) times daily.   triamterene-hydrochlorothiazide 37.5-25 MG tablet Commonly known as: MAXZIDE-25 Take 1 tablet by mouth daily.        BP 122/74 (BP Location: Right Arm, Patient Position: Sitting, Cuff Size: Normal)   Pulse 83   Temp (!) 97.5 F (36.4 C) (Oral)   Resp 16   Ht '5\' 5"'$  (1.651 m)   Wt 171 lb 3.2 oz (77.7 kg)   SpO2 97%   BMI 28.49 kg/m  General: Awake, alert, appears stated age Eyes: PERRLA, EOMi Ears:  Patent, TM's neg b/l Heart: RRR, no murmurs, no carotid bruits Lungs: CTAB, no accessory muscle use MSK: 5/5 strength throughout, gait normal Neuro: No cerebellar signs, patellar reflex 2/4 b/l wo clonus, calcaneal reflex 0/4 b/l wo clonus, biceps reflex 1/4 b/l wo clonus; Dix-Hall-Pike positive on R. Psych: Age appropriate judgment and insight, normal mood and affect  Benign paroxysmal positional vertigo of right ear - Plan: Ambulatory referral to Physical Therapy  5 d pred burst 40 mg/d to cover for labyrinthitis. Will refer to vest rehab in meanwhile. Stay hydrated.  F/u prn. Pt voiced understanding and agreement to the plan.  [place], DO 10/23/22 2:31 PM

## 2022-10-23 NOTE — Patient Instructions (Addendum)
Stay hydrated.   If you do not hear anything about your referral in the next few days, call our office and ask for an update.  Keep doing the [place] Maneuver.   Let us know if you need anything.

## 2022-11-29 ENCOUNTER — Ambulatory Visit: Payer: Medicare Other | Admitting: Family Medicine

## 2022-12-09 ENCOUNTER — Other Ambulatory Visit: Payer: Self-pay | Admitting: Family Medicine

## 2022-12-09 DIAGNOSIS — M1A9XX0 Chronic gout, unspecified, without tophus (tophi): Secondary | ICD-10-CM

## 2022-12-17 ENCOUNTER — Encounter: Payer: Self-pay | Admitting: Family Medicine

## 2022-12-17 ENCOUNTER — Ambulatory Visit (INDEPENDENT_AMBULATORY_CARE_PROVIDER_SITE_OTHER): Payer: BC Managed Care – PPO | Admitting: Family Medicine

## 2022-12-17 ENCOUNTER — Other Ambulatory Visit: Payer: Self-pay | Admitting: Family Medicine

## 2022-12-17 VITALS — BP 136/82 | HR 72 | Temp 97.5°F | Ht 65.0 in | Wt 170.0 lb

## 2022-12-17 DIAGNOSIS — H9201 Otalgia, right ear: Secondary | ICD-10-CM | POA: Diagnosis not present

## 2022-12-17 DIAGNOSIS — T451X5A Adverse effect of antineoplastic and immunosuppressive drugs, initial encounter: Secondary | ICD-10-CM | POA: Diagnosis not present

## 2022-12-17 DIAGNOSIS — M1A9XX0 Chronic gout, unspecified, without tophus (tophi): Secondary | ICD-10-CM | POA: Diagnosis not present

## 2022-12-17 DIAGNOSIS — E876 Hypokalemia: Secondary | ICD-10-CM

## 2022-12-17 DIAGNOSIS — G620 Drug-induced polyneuropathy: Secondary | ICD-10-CM | POA: Diagnosis not present

## 2022-12-17 LAB — BASIC METABOLIC PANEL
BUN: 20 mg/dL (ref 6–23)
CO2: 26 mEq/L (ref 19–32)
Calcium: 10.2 mg/dL (ref 8.4–10.5)
Chloride: 100 mEq/L (ref 96–112)
Creatinine, Ser: 0.73 mg/dL (ref 0.40–1.20)
GFR: 87.18 mL/min (ref >60.00)
Glucose, Bld: 114 mg/dL — ABNORMAL HIGH (ref 70–99)
Potassium: 3.2 mEq/L — ABNORMAL LOW (ref 3.5–5.1)
Sodium: 139 mEq/L (ref 135–145)

## 2022-12-17 LAB — URIC ACID: Uric Acid, Serum: 6.1 mg/dL (ref 2.4–7.0)

## 2022-12-17 MED ORDER — MOMETASONE FUROATE 50 MCG/ACT NA SUSP: 2.0000 | Freq: Every day | NASAL | 12 refills | Status: DC

## 2022-12-17 MED ORDER — VENLAFAXINE HCL ER 37.5 MG PO CP24: 37.5000 mg | ORAL_CAPSULE | Freq: Every day | ORAL | 1 refills | Status: DC

## 2022-12-17 MED ORDER — POTASSIUM CHLORIDE ER 10 MEQ PO TBCR: 20.0000 meq | EXTENDED_RELEASE_TABLET | Freq: Every day | ORAL | 0 refills | Status: DC

## 2022-12-17 MED ORDER — ALLOPURINOL 100 MG PO TABS: 100.0000 mg | ORAL_TABLET | Freq: Every day | ORAL | 6 refills | Status: DC

## 2022-12-17 NOTE — Progress Notes (Signed)
Chief Complaint  Patient presents with   Follow-up    6 month Arthritis Neuropathy Ear pain     Alexandria Bradley is a 62 y.o. female here for gout.  Currently being treated with Probenecid 500 mg bid. Getting more expensive, $78/mo.  The joint(s) affected include: toe Most recent uric acid level is: 6.2 Reports compliance. Side effects of medications: None Is avoiding seafood, sweet/sugary beverages, alcohol, and red meats.  R ear pain that comes and goes. No fevers, runny/stuff nose, drainage, trauma.   9 yrs of neuropathy after chemo. Has not tried anything. It bothers her when she walks for periods of time and at night. She is now wanting something as it affects her sleep and does not seem to be improving.   Past Medical History:  Diagnosis Date   Anxiety    Arthritis    Back pain    Breast cancer ([facility])    left   Fatty liver    GERD (gastroesophageal reflux disease)    Headache(784.0)    PMH : migraines   History of kidney stones    Hypercholesterolemia    Hypertension    Hypothyroidism    Osteoporosis    Personal history of chemotherapy    Personal history of radiation therapy    PONV (postoperative nausea and vomiting)    difficult to wake up   Pre-diabetes    Psoriasis    PVC's (premature ventricular contractions)    Radiation 08/18/14-10/07/14   Left breast   Sleep apnea    no CPAP    BP 136/82 (BP Location: Right Arm, Cuff Size: Normal)   Pulse 72   Temp (!) 97.5 F (36.4 C) (Oral)   Ht 5\' 5"  (1.651 m)   Wt 170 lb (77.1 kg)   SpO2 97%   BMI 28.29 kg/m  Gen: Awake, alert, appears stated age Ears: neg b/l Neck: No masses or asymmetry Heart: RRR, no murmurs, no bruits, no LE edema Lungs: CTAB, no accessory muscle use Skin: No erythema or warmth Psych: Age appropriate judgment and insight, nml mood and affect  Chronic gout involving toe of left foot without tophus, unspecified cause - Plan: Basic metabolic panel, Uric acid  Referred otalgia of  right ear  Neuropathy due to chemotherapeutic drug ([place]) - Plan: venlafaxine XR (EFFEXOR XR) 37.5 MG 24 hr capsule  Chronic, stable. Change back to allopurinol (thought she had a rash on it so was changed to Probenecid). Having second thoughts about that rash as it continued after stopping, saw derm who did not think it was from med either. Will switch back. Ck today.  INCS. 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  Start Effexor 37.5 mg daily.  Will try to avoid sedating effects of Neurontin and multiple times per day dosing.  Follow-up in 1 month to recheck this. The patient voiced understanding and agreement to the plan.  [place], DO 12/17/22 12:12 PM

## 2022-12-17 NOTE — Patient Instructions (Addendum)
Give [place] 2-3 business days to get the results of your labs back.   Keep the diet clean and stay active.  Use the nasal spray daily.   Foods that may reduce pain: 1) Ginger 2) Blueberries 3) Salmon 4) Pumpkin seeds 5) dark chocolate 6) turmeric 7) tart cherries 8) virgin olive oil 9) chilli peppers 10) mint 11) krill oil  Let us know if you need anything.

## 2022-12-24 ENCOUNTER — Other Ambulatory Visit (INDEPENDENT_AMBULATORY_CARE_PROVIDER_SITE_OTHER): Payer: BC Managed Care – PPO

## 2022-12-24 DIAGNOSIS — E876 Hypokalemia: Secondary | ICD-10-CM | POA: Diagnosis not present

## 2022-12-24 LAB — BASIC METABOLIC PANEL
BUN: 16 mg/dL (ref 6–23)
CO2: 25 mEq/L (ref 19–32)
Calcium: 10.4 mg/dL (ref 8.4–10.5)
Chloride: 102 mEq/L (ref 96–112)
Creatinine, Ser: 0.69 mg/dL (ref 0.40–1.20)
GFR: 91.99 mL/min (ref >60.00)
Glucose, Bld: 116 mg/dL — ABNORMAL HIGH (ref 70–99)
Potassium: 3.7 mEq/L (ref 3.5–5.1)
Sodium: 138 mEq/L (ref 135–145)

## 2022-12-24 LAB — MAGNESIUM: Magnesium: 2 mg/dL (ref 1.5–2.5)

## 2023-01-07 ENCOUNTER — Ambulatory Visit (INDEPENDENT_AMBULATORY_CARE_PROVIDER_SITE_OTHER): Payer: BC Managed Care – PPO | Admitting: Family Medicine

## 2023-01-07 VITALS — BP 128/80 | HR 83 | Temp 98.4°F | Ht 65.0 in | Wt 171.4 lb

## 2023-01-07 DIAGNOSIS — R300 Dysuria: Secondary | ICD-10-CM

## 2023-01-07 DIAGNOSIS — L84 Corns and callosities: Secondary | ICD-10-CM | POA: Diagnosis not present

## 2023-01-07 LAB — POC URINALSYSI DIPSTICK (AUTOMATED)
Bilirubin, UA: NEGATIVE
Blood, UA: NEGATIVE
Glucose, UA: NEGATIVE
Ketones, UA: NEGATIVE
Leukocytes, UA: NEGATIVE
Nitrite, UA: NEGATIVE
Protein, UA: NEGATIVE
Spec Grav, UA: 1.015 (ref 1.010–1.025)
Urobilinogen, UA: 0.2 E.U./dL
pH, UA: 6.5 (ref 5.0–8.0)

## 2023-01-07 MED ORDER — SULFAMETHOXAZOLE-TRIMETHOPRIM 800-160 MG PO TABS: 1.0000 | ORAL_TABLET | Freq: Two times a day (BID) | ORAL | 0 refills | Status: DC

## 2023-01-07 MED ORDER — FLUCONAZOLE 150 MG PO TABS: ORAL_TABLET | ORAL | 0 refills | Status: DC

## 2023-01-07 NOTE — Progress Notes (Signed)
Chief Complaint  Patient presents with   Dysuria    Low back pain Abdominal pain     Alexandria Bradley is a 62 y.o. female here for possible UTI.  Duration: 2 days. Symptoms: myalgias, urinary retention, fever (100.5 F max), urgency, pelvic pressure/pain Denies: urinary frequency, hematuria, urinary hesitancy, nausea, vomiting, flank pain, vaginal discharge Hx of recurrent UTI? No Has been using Tylenol/ibuprofen.  Denies new sexual partners.  Past Medical History:  Diagnosis Date   Anxiety    Arthritis    Back pain    Breast cancer    left   Fatty liver    GERD (gastroesophageal reflux disease)    Headache(784.0)    PMH : migraines   History of kidney stones    Hypercholesterolemia    Hypertension    Hypothyroidism    Osteoporosis    Personal history of chemotherapy    Personal history of radiation therapy    PONV (postoperative nausea and vomiting)    difficult to wake up   Pre-diabetes    Psoriasis    PVC's (premature ventricular contractions)    Radiation 08/18/14-10/07/14   Left breast   Sleep apnea    no CPAP     BP 128/80 (BP Location: Right Arm, Patient Position: Sitting, Cuff Size: Normal)   Pulse 83   Temp 98.4 F (36.9 C) (Oral)   Ht 5\' 5"  (1.651 m)   Wt 171 lb 6 oz (77.7 kg)   SpO2 99%   BMI 28.52 kg/m  General: Awake, alert, appears stated age Heart: RRR Lungs: CTAB, normal respiratory effort, no accessory muscle usage Abd: BS+, soft, TTP in lower quadrants bilaterally, ND, no masses or organomegaly MSK: No CVA tenderness, neg Lloyd's sign Psych: Age appropriate judgment and insight  Dysuria - Plan: sulfamethoxazole-trimethoprim (BACTRIM DS) 800-160 MG tablet, POCT Urinalysis Dipstick (Automated), Urine Culture  Corn of foot - Plan: Ambulatory referral to Podiatry  UA unremarkable.  Given fevers and urinary discomfort, will treat with 7 days of Bactrim.  Culture taken today.  She reports having an appointment with her gynecologist coming  up.  Stay hydrated. Seek immediate care if pt starts to develop sustained fevers, new/worsening symptoms, uncontrollable N/V. F/u prn. The patient voiced understanding and agreement to the plan.  Jilda Roche [place], DO 01/07/23 3:00 PM

## 2023-01-07 NOTE — Patient Instructions (Addendum)
Stay hydrated.   Warning signs/symptoms: Uncontrollable nausea/vomiting, fevers, worsening symptoms despite treatment, confusion.  Give [place] around 2 business days to get culture back to you.  If you do not hear anything about your referral in the next 1-2 weeks, call our office and ask for an update.  Let us know if you need anything.

## 2023-01-08 ENCOUNTER — Emergency Department (HOSPITAL_BASED_OUTPATIENT_CLINIC_OR_DEPARTMENT_OTHER): Payer: BC Managed Care – PPO

## 2023-01-08 ENCOUNTER — Other Ambulatory Visit: Payer: Self-pay

## 2023-01-08 ENCOUNTER — Encounter (HOSPITAL_BASED_OUTPATIENT_CLINIC_OR_DEPARTMENT_OTHER): Payer: Self-pay | Admitting: Radiology

## 2023-01-08 ENCOUNTER — Other Ambulatory Visit: Payer: Self-pay | Admitting: Family Medicine

## 2023-01-08 ENCOUNTER — Inpatient Hospital Stay (HOSPITAL_BASED_OUTPATIENT_CLINIC_OR_DEPARTMENT_OTHER)
Admission: EM | Admit: 2023-01-08 | Discharge: 2023-01-11 | DRG: 872 | Disposition: A | Discharge status: Home / Self Care | Payer: BC Managed Care – PPO | Attending: Internal Medicine | Admitting: Internal Medicine

## 2023-01-08 DIAGNOSIS — Z853 Personal history of malignant neoplasm of breast: Secondary | ICD-10-CM | POA: Diagnosis not present

## 2023-01-08 DIAGNOSIS — Z801 Family history of malignant neoplasm of trachea, bronchus and lung: Secondary | ICD-10-CM

## 2023-01-08 DIAGNOSIS — Z923 Personal history of irradiation: Secondary | ICD-10-CM

## 2023-01-08 DIAGNOSIS — Z881 Allergy status to other antibiotic agents status: Secondary | ICD-10-CM

## 2023-01-08 DIAGNOSIS — Z9221 Personal history of antineoplastic chemotherapy: Secondary | ICD-10-CM | POA: Diagnosis not present

## 2023-01-08 DIAGNOSIS — A419 Sepsis, unspecified organism: Secondary | ICD-10-CM | POA: Diagnosis not present

## 2023-01-08 DIAGNOSIS — M810 Age-related osteoporosis without current pathological fracture: Secondary | ICD-10-CM | POA: Diagnosis present

## 2023-01-08 DIAGNOSIS — E039 Hypothyroidism, unspecified: Secondary | ICD-10-CM | POA: Diagnosis not present

## 2023-01-08 DIAGNOSIS — E7800 Pure hypercholesterolemia, unspecified: Secondary | ICD-10-CM | POA: Diagnosis not present

## 2023-01-08 DIAGNOSIS — Z79899 Other long term (current) drug therapy: Secondary | ICD-10-CM

## 2023-01-08 DIAGNOSIS — Z9882 Breast implant status: Secondary | ICD-10-CM

## 2023-01-08 DIAGNOSIS — F419 Anxiety disorder, unspecified: Secondary | ICD-10-CM | POA: Diagnosis not present

## 2023-01-08 DIAGNOSIS — K5792 Diverticulitis of intestine, part unspecified, without perforation or abscess without bleeding: Secondary | ICD-10-CM | POA: Diagnosis not present

## 2023-01-08 DIAGNOSIS — K760 Fatty (change of) liver, not elsewhere classified: Secondary | ICD-10-CM | POA: Diagnosis present

## 2023-01-08 DIAGNOSIS — L409 Psoriasis, unspecified: Secondary | ICD-10-CM | POA: Diagnosis present

## 2023-01-08 DIAGNOSIS — M109 Gout, unspecified: Secondary | ICD-10-CM | POA: Diagnosis not present

## 2023-01-08 DIAGNOSIS — Z1152 Encounter for screening for COVID-19: Secondary | ICD-10-CM | POA: Diagnosis not present

## 2023-01-08 DIAGNOSIS — K5732 Diverticulitis of large intestine without perforation or abscess without bleeding: Secondary | ICD-10-CM | POA: Diagnosis not present

## 2023-01-08 DIAGNOSIS — Z9013 Acquired absence of bilateral breasts and nipples: Secondary | ICD-10-CM

## 2023-01-08 DIAGNOSIS — Z8249 Family history of ischemic heart disease and other diseases of the circulatory system: Secondary | ICD-10-CM | POA: Diagnosis not present

## 2023-01-08 DIAGNOSIS — Z9049 Acquired absence of other specified parts of digestive tract: Secondary | ICD-10-CM

## 2023-01-08 DIAGNOSIS — L408 Other psoriasis: Secondary | ICD-10-CM | POA: Diagnosis present

## 2023-01-08 DIAGNOSIS — E785 Hyperlipidemia, unspecified: Secondary | ICD-10-CM | POA: Diagnosis present

## 2023-01-08 DIAGNOSIS — Z79890 Hormone replacement therapy (postmenopausal): Secondary | ICD-10-CM

## 2023-01-08 DIAGNOSIS — R6520 Severe sepsis without septic shock: Secondary | ICD-10-CM | POA: Diagnosis present

## 2023-01-08 DIAGNOSIS — Z888 Allergy status to other drugs, medicaments and biological substances status: Secondary | ICD-10-CM

## 2023-01-08 DIAGNOSIS — I10 Essential (primary) hypertension: Secondary | ICD-10-CM | POA: Diagnosis not present

## 2023-01-08 DIAGNOSIS — T451X5A Adverse effect of antineoplastic and immunosuppressive drugs, initial encounter: Secondary | ICD-10-CM

## 2023-01-08 DIAGNOSIS — K219 Gastro-esophageal reflux disease without esophagitis: Secondary | ICD-10-CM | POA: Diagnosis not present

## 2023-01-08 DIAGNOSIS — G4730 Sleep apnea, unspecified: Secondary | ICD-10-CM | POA: Diagnosis not present

## 2023-01-08 DIAGNOSIS — C50912 Malignant neoplasm of unspecified site of left female breast: Secondary | ICD-10-CM | POA: Diagnosis not present

## 2023-01-08 DIAGNOSIS — Z83719 Family history of colon polyps, unspecified: Secondary | ICD-10-CM

## 2023-01-08 DIAGNOSIS — Z885 Allergy status to narcotic agent status: Secondary | ICD-10-CM

## 2023-01-08 DIAGNOSIS — G4733 Obstructive sleep apnea (adult) (pediatric): Secondary | ICD-10-CM | POA: Diagnosis present

## 2023-01-08 DIAGNOSIS — Z87442 Personal history of urinary calculi: Secondary | ICD-10-CM

## 2023-01-08 DIAGNOSIS — Z87891 Personal history of nicotine dependence: Secondary | ICD-10-CM | POA: Diagnosis not present

## 2023-01-08 DIAGNOSIS — Z86010 Personal history of colonic polyps: Secondary | ICD-10-CM

## 2023-01-08 LAB — URINALYSIS, ROUTINE W REFLEX MICROSCOPIC
Bilirubin Urine: NEGATIVE
Glucose, UA: NEGATIVE mg/dL
Hgb urine dipstick: NEGATIVE
Ketones, ur: NEGATIVE mg/dL
Leukocytes,Ua: NEGATIVE
Nitrite: NEGATIVE
Protein, ur: NEGATIVE mg/dL
Specific Gravity, Urine: 1.01 (ref 1.005–1.030)
pH: 7 (ref 5.0–8.0)

## 2023-01-08 LAB — COMPREHENSIVE METABOLIC PANEL
ALT: 65 U/L — ABNORMAL HIGH (ref 0–44)
AST: 81 U/L — ABNORMAL HIGH (ref 15–41)
Albumin: 3.7 g/dL (ref 3.5–5.0)
Alkaline Phosphatase: 94 U/L (ref 38–126)
Anion gap: 10 (ref 5–15)
BUN: 14 mg/dL (ref 8–23)
CO2: 21 mmol/L — ABNORMAL LOW (ref 22–32)
Calcium: 8.8 mg/dL — ABNORMAL LOW (ref 8.9–10.3)
Chloride: 101 mmol/L (ref 98–111)
Creatinine, Ser: 0.86 mg/dL (ref 0.44–1.00)
GFR, Estimated: >60 mL/min (ref >60)
Glucose, Bld: 140 mg/dL — ABNORMAL HIGH (ref 70–99)
Potassium: 3.3 mmol/L — ABNORMAL LOW (ref 3.5–5.1)
Sodium: 132 mmol/L — ABNORMAL LOW (ref 135–145)
Total Bilirubin: 1.8 mg/dL — ABNORMAL HIGH (ref 0.3–1.2)
Total Protein: 7.4 g/dL (ref 6.5–8.1)

## 2023-01-08 LAB — CBC
HCT: 37.4 % (ref 36.0–46.0)
HCT: 34.7 % — ABNORMAL LOW (ref 36.0–46.0)
Hemoglobin: 12.8 g/dL (ref 12.0–15.0)
Hemoglobin: 11.5 g/dL — ABNORMAL LOW (ref 12.0–15.0)
MCH: 30.6 pg (ref 26.0–34.0)
MCH: 30.3 pg (ref 26.0–34.0)
MCHC: 34.2 g/dL (ref 30.0–36.0)
MCHC: 33.1 g/dL (ref 30.0–36.0)
MCV: 89.5 fL (ref 80.0–100.0)
MCV: 91.3 fL (ref 80.0–100.0)
Platelets: 258 10*3/uL (ref 150–400)
Platelets: 218 10*3/uL (ref 150–400)
RBC: 4.18 MIL/uL (ref 3.87–5.11)
RBC: 3.8 MIL/uL — ABNORMAL LOW (ref 3.87–5.11)
RDW: 12.8 % (ref 11.5–15.5)
RDW: 13.3 % (ref 11.5–15.5)
WBC: 15.6 10*3/uL — ABNORMAL HIGH (ref 4.0–10.5)
WBC: 9.7 10*3/uL (ref 4.0–10.5)
nRBC: 0 % (ref 0.0–0.2)
nRBC: 0 % (ref 0.0–0.2)

## 2023-01-08 LAB — RESP PANEL BY RT-PCR (RSV, FLU A&B, COVID)  RVPGX2
Influenza A by PCR: NEGATIVE
Influenza B by PCR: NEGATIVE
Resp Syncytial Virus by PCR: NEGATIVE
SARS Coronavirus 2 by RT PCR: NEGATIVE

## 2023-01-08 LAB — URINE CULTURE
MICRO NUMBER:: 14800816
Result:: NO GROWTH
SPECIMEN QUALITY:: ADEQUATE

## 2023-01-08 LAB — TSH: TSH: 0.012 u[IU]/mL — ABNORMAL LOW (ref 0.350–4.500)

## 2023-01-08 LAB — LACTIC ACID, PLASMA
Lactic Acid, Venous: 3.1 mmol/L (ref 0.5–1.9)
Lactic Acid, Venous: 2.4 mmol/L (ref 0.5–1.9)
Lactic Acid, Venous: 2 mmol/L (ref 0.5–1.9)

## 2023-01-08 LAB — CULTURE, BLOOD (ROUTINE X 2): Special Requests: ADEQUATE

## 2023-01-08 LAB — CREATININE, SERUM
Creatinine, Ser: 0.79 mg/dL (ref 0.44–1.00)
GFR, Estimated: >60 mL/min (ref >60)

## 2023-01-08 LAB — C-REACTIVE PROTEIN: CRP: 16.3 mg/dL — ABNORMAL HIGH (ref <1.0)

## 2023-01-08 MED ORDER — LEVOTHYROXINE SODIUM 75 MCG PO TABS
150.0000 ug | ORAL_TABLET | Freq: Every day | ORAL | Status: DC
  Administered 2023-01-09 – 2023-01-11 (×3): 150 ug via ORAL
  Filled 2023-01-08 (×3): qty 2

## 2023-01-08 MED ORDER — ALBUTEROL SULFATE HFA 108 (90 BASE) MCG/ACT IN AERS: 2.0000 | INHALATION_SPRAY | Freq: Four times a day (QID) | RESPIRATORY_TRACT | Status: DC | PRN

## 2023-01-08 MED ORDER — PIPERACILLIN-TAZOBACTAM 3.375 G IVPB 30 MIN: 3.3750 g | Freq: Once | INTRAVENOUS | Status: DC

## 2023-01-08 MED ORDER — FENTANYL CITRATE PF 50 MCG/ML IJ SOSY
25.0000 ug | PREFILLED_SYRINGE | Freq: Once | INTRAMUSCULAR | Status: AC
  Administered 2023-01-08: 25 ug via INTRAVENOUS
  Filled 2023-01-08: qty 1

## 2023-01-08 MED ORDER — KETOROLAC TROMETHAMINE 15 MG/ML IJ SOLN
15.0000 mg | Freq: Once | INTRAMUSCULAR | Status: AC
  Administered 2023-01-08: 15 mg via INTRAVENOUS
  Filled 2023-01-08: qty 1

## 2023-01-08 MED ORDER — TRIAMCINOLONE ACETONIDE 0.1 % EX CREA: 1.0000 | TOPICAL_CREAM | Freq: Two times a day (BID) | CUTANEOUS | Status: DC | PRN

## 2023-01-08 MED ORDER — ALLOPURINOL 100 MG PO TABS
100.0000 mg | ORAL_TABLET | Freq: Every day | ORAL | Status: DC
  Administered 2023-01-09 – 2023-01-11 (×3): 100 mg via ORAL
  Filled 2023-01-08 (×3): qty 1

## 2023-01-08 MED ORDER — FENTANYL CITRATE PF 50 MCG/ML IJ SOSY: 25.0000 ug | PREFILLED_SYRINGE | INTRAMUSCULAR | Status: DC | PRN

## 2023-01-08 MED ORDER — PIPERACILLIN-TAZOBACTAM 3.375 G IVPB 30 MIN
3.3750 g | Freq: Once | INTRAVENOUS | Status: AC
  Administered 2023-01-08: 3.375 g via INTRAVENOUS
  Filled 2023-01-08: qty 50

## 2023-01-08 MED ORDER — SODIUM CHLORIDE 0.9 % IV SOLN
2.0000 g | INTRAVENOUS | Status: DC
  Administered 2023-01-09 – 2023-01-11 (×3): 2 g via INTRAVENOUS
  Filled 2023-01-08 (×3): qty 20

## 2023-01-08 MED ORDER — SODIUM CHLORIDE 0.9 % IV SOLN
2.0000 g | Freq: Once | INTRAVENOUS | Status: AC
  Administered 2023-01-08: 2 g via INTRAVENOUS
  Filled 2023-01-08: qty 20

## 2023-01-08 MED ORDER — ONDANSETRON HCL 4 MG/2ML IJ SOLN
4.0000 mg | Freq: Four times a day (QID) | INTRAMUSCULAR | Status: DC | PRN
  Administered 2023-01-09: 4 mg via INTRAVENOUS
  Filled 2023-01-08: qty 2

## 2023-01-08 MED ORDER — POTASSIUM CHLORIDE 2 MEQ/ML IV SOLN
INTRAVENOUS | Status: DC
  Filled 2023-01-08 (×3): qty 1000

## 2023-01-08 MED ORDER — LACTATED RINGERS IV BOLUS (SEPSIS)
1000.0000 mL | Freq: Once | INTRAVENOUS | Status: AC
  Administered 2023-01-08: 1000 mL via INTRAVENOUS

## 2023-01-08 MED ORDER — ALBUTEROL SULFATE (2.5 MG/3ML) 0.083% IN NEBU: 2.5000 mg | INHALATION_SOLUTION | RESPIRATORY_TRACT | Status: DC | PRN

## 2023-01-08 MED ORDER — IOHEXOL 300 MG/ML  SOLN
100.0000 mL | Freq: Once | INTRAMUSCULAR | Status: AC | PRN
  Administered 2023-01-08: 100 mL via INTRAVENOUS

## 2023-01-08 MED ORDER — PIPERACILLIN-TAZOBACTAM 3.375 G IVPB: 3.3750 g | Freq: Three times a day (TID) | INTRAVENOUS | Status: DC

## 2023-01-08 MED ORDER — OXYCODONE HCL 5 MG PO TABS: 5.0000 mg | ORAL_TABLET | ORAL | Status: DC | PRN

## 2023-01-08 MED ORDER — LEVOTHYROXINE SODIUM 150 MCG PO CAPS: 150.0000 ug | ORAL_CAPSULE | Freq: Every day | ORAL | Status: DC

## 2023-01-08 MED ORDER — LIOTHYRONINE SODIUM 5 MCG PO TABS
5.0000 ug | ORAL_TABLET | Freq: Every day | ORAL | Status: DC
  Administered 2023-01-10 – 2023-01-11 (×2): 5 ug via ORAL
  Filled 2023-01-08 (×3): qty 1

## 2023-01-08 MED ORDER — ONDANSETRON HCL 4 MG PO TABS: 4.0000 mg | ORAL_TABLET | Freq: Four times a day (QID) | ORAL | Status: DC | PRN

## 2023-01-08 MED ORDER — ONDANSETRON HCL 4 MG/2ML IJ SOLN
4.0000 mg | Freq: Once | INTRAMUSCULAR | Status: AC
  Administered 2023-01-08: 4 mg via INTRAVENOUS
  Filled 2023-01-08: qty 2

## 2023-01-08 MED ORDER — METRONIDAZOLE 500 MG/100ML IV SOLN
500.0000 mg | Freq: Two times a day (BID) | INTRAVENOUS | Status: DC
  Administered 2023-01-09 – 2023-01-11 (×5): 500 mg via INTRAVENOUS
  Filled 2023-01-08 (×5): qty 100

## 2023-01-08 MED ORDER — LACTATED RINGERS IV BOLUS (SEPSIS)
500.0000 mL | Freq: Once | INTRAVENOUS | Status: AC
  Administered 2023-01-08: 500 mL via INTRAVENOUS

## 2023-01-08 MED ORDER — LACTATED RINGERS IV SOLN: INTRAVENOUS | Status: DC

## 2023-01-08 MED ORDER — METRONIDAZOLE 500 MG/100ML IV SOLN
500.0000 mg | Freq: Once | INTRAVENOUS | Status: AC
  Administered 2023-01-08: 500 mg via INTRAVENOUS
  Filled 2023-01-08: qty 100

## 2023-01-08 MED ORDER — HEPARIN SODIUM (PORCINE) 5000 UNIT/ML IJ SOLN
5000.0000 [IU] | Freq: Three times a day (TID) | INTRAMUSCULAR | Status: DC
  Administered 2023-01-08 – 2023-01-11 (×8): 5000 [IU] via SUBCUTANEOUS
  Filled 2023-01-08 (×9): qty 1

## 2023-01-08 NOTE — Progress Notes (Signed)
[facility] is following code sepsis 

## 2023-01-08 NOTE — ED Provider Notes (Signed)
[facility] Provider Note   CSN: [id number] Arrival date & time: 01/08/23  1242     History  Chief Complaint  Patient presents with   Fatigue    Alexandria Bradley is a 62 y.o. female with history of anxiety, breast cancer s/p surgery, chemo and radiation; fatty liver, GERD, HLD, HTN, hypothyroidism, osteoporosis, psoriasis, sleep apnea who presents to the ER complaining of fatigue, body aches, abdominal pain, chills, nausea, SOB x 4 days.  Went to her PCP yesterday and had urinalysis done, unremarkable, they sent for urine culture. No dysuria or hematuria.  HPI     Home Medications Prior to Admission medications   Medication Sig Start Date End Date Taking? Authorizing Provider  albuterol (VENTOLIN HFA) 108 (90 Base) MCG/ACT inhaler Inhale 2 puffs into the lungs every 6 (six) hours as needed for wheezing or shortness of breath (Cough). 09/24/22  Yes Theadora Rama Scales, PA-C  allopurinol (ZYLOPRIM) 100 MG tablet Take 1 tablet (100 mg total) by mouth daily. 12/17/22  Yes Sharlene Dory, DO  diltiazem (CARDIZEM CD) 240 MG 24 hr capsule Take 1 capsule (240 mg total) by mouth daily. 04/17/22  Yes Wendall Stade, MD  fexofenadine (ALLEGRA) 180 MG tablet Take 180 mg by mouth daily.   Yes [provider]  Levothyroxine Sodium (TIROSINT) 150 MCG CAPS Take 150 mcg by mouth daily before breakfast.   Yes [provider]  liothyronine (CYTOMEL) 5 MCG tablet Take 5 mcg by mouth daily before breakfast.  11/13/13  Yes [provider]  mometasone (NASONEX) 50 MCG/ACT nasal spray Place 2 sprays into the nose daily. 12/17/22  Yes Sharlene Dory, DO  sulfamethoxazole-trimethoprim (BACTRIM DS) 800-160 MG tablet Take 1 tablet by mouth 2 (two) times daily for 7 days. 01/07/23 01/14/23 Yes Wendling, Jilda Roche, DO  triamcinolone cream (KENALOG) 0.1 % Apply 1 application topically 2 (two) times daily. Patient taking  differently: Apply 1 application  topically as needed (flares). 11/26/21  Yes Sharlene Dory, DO  triamterene-hydrochlorothiazide (MAXZIDE-25) 37.5-25 MG tablet Take 1 tablet by mouth daily. 05/13/22  Yes Wendall Stade, MD  fluconazole (DIFLUCAN) 150 MG tablet Take 1 tab, repeat in 72 hours if no improvement. Patient not taking: Reported on 01/08/2023 01/07/23   Sharlene Dory, DO  guaifenesin (HUMIBID E) 400 MG TABS tablet Take 1 tablet 3 times daily as needed for chest congestion and cough Patient not taking: Reported on 01/08/2023 09/24/22   Theadora Rama Scales, PA-C  potassium chloride (KLOR-CON 10) 10 MEQ tablet Take 2 tablets (20 mEq total) by mouth daily for 5 days. Patient not taking: Reported on 01/08/2023 12/17/22 01/08/23  Sharlene Dory, DO  venlafaxine XR (EFFEXOR XR) 37.5 MG 24 hr capsule Take 1 capsule (37.5 mg total) by mouth daily with breakfast. Patient not taking: Reported on 01/08/2023 12/17/22   Sharlene Dory, DO  prochlorperazine (COMPAZINE) 10 MG tablet Take 1 tablet (10 mg total) by mouth every 6 (six) hours as needed (Nausea or vomiting). 11/13/18 05/11/19  Magrinat, Valentino Hue, MD      Allergies    Chlorhexidine, Ciprofloxacin, Prednisone, Codeine, Cortisone, and Colchicine    Review of Systems   Review of Systems  Constitutional:  Positive for chills and fever.  Respiratory:  Positive for shortness of breath. Negative for cough.   Gastrointestinal:  Positive for abdominal pain and nausea. Negative for diarrhea and vomiting.  Genitourinary:  Negative for dysuria and hematuria.  Musculoskeletal:  Positive for back pain and myalgias.  All other systems reviewed and are negative.   Physical Exam Updated Vital Signs BP (!) 106/51   Pulse 86   Temp (!) 100.6 F (38.1 C) (Oral)   Resp 18   Ht 5\' 6"  (1.676 m)   Wt 76.2 kg   SpO2 94%   BMI 27.12 kg/m  Physical Exam Vitals and nursing note reviewed.  Constitutional:      Appearance:  Normal appearance.  HENT:     Head: Normocephalic and atraumatic.  Eyes:     Conjunctiva/sclera: Conjunctivae normal.  Cardiovascular:     Rate and Rhythm: Normal rate and regular rhythm.  Pulmonary:     Effort: Pulmonary effort is normal. Tachypnea present. No respiratory distress.     Breath sounds: Normal breath sounds.  Abdominal:     General: There is no distension.     Palpations: Abdomen is soft.     Tenderness: There is no abdominal tenderness.  Skin:    General: Skin is warm and dry.  Neurological:     General: No focal deficit present.     Mental Status: She is alert.     ED Results / Procedures / Treatments   Labs (all labs ordered are listed, but only abnormal results are displayed) Labs Reviewed  COMPREHENSIVE METABOLIC PANEL - Abnormal; Notable for the following components:      Result Value   Sodium 132 (*)    Potassium 3.3 (*)    CO2 21 (*)    Glucose, Bld 140 (*)    Calcium 8.8 (*)    AST 81 (*)    ALT 65 (*)    Total Bilirubin 1.8 (*)    All other components within normal limits  CBC - Abnormal; Notable for the following components:   WBC 15.6 (*)    All other components within normal limits  LACTIC ACID, PLASMA - Abnormal; Notable for the following components:   Lactic Acid, Venous 3.1 (*)    All other components within normal limits  LACTIC ACID, PLASMA - Abnormal; Notable for the following components:   Lactic Acid, Venous 2.4 (*)    All other components within normal limits  RESP PANEL BY RT-PCR (RSV, FLU A&B, COVID)  [id number]  CULTURE, BLOOD (ROUTINE X 2)  CULTURE, BLOOD (ROUTINE X 2)  URINALYSIS, ROUTINE W REFLEX MICROSCOPIC    EKG EKG Interpretation  Date/Time:  Wednesday January 08 2023 13:09:43 EDT Ventricular Rate:  113 PR Interval:  159 QRS Duration: 102 QT Interval:  335 QTC Calculation: 460 R Axis:   13 Text Interpretation: Sinus tachycardia Confirmed by Vanetta Mulders [phone]) on 01/08/2023 1:11:07 PM  Radiology CT ABDOMEN  PELVIS W CONTRAST  Result Date: 01/08/2023 CLINICAL DATA:  Sepsis, abdominal pain. Body aches with back pain, chills and fever. Nausea and shortness of breath. Tachycardia. History of breast cancer. EXAM: CT ABDOMEN AND PELVIS WITH CONTRAST TECHNIQUE: Multidetector CT imaging of the abdomen and pelvis was performed using the standard protocol following bolus administration of intravenous contrast. RADIATION DOSE REDUCTION: This exam was performed according to the departmental dose-optimization program which includes automated exposure control, adjustment of the mA and/or kV according to patient size and/or use of iterative reconstruction technique. CONTRAST:  OMNIPAQUE IOHEXOL 300 MG/ML  SOLN COMPARISON:  Abdominopelvic CT 03/12/2020, chest CT 04/03/2021 FINDINGS: Lower chest: Lung bases demonstrate no acute findings. 3 mm nodule over the right middle lobe unchanged from 2022. Hepatobiliary: Previous cholecystectomy. Liver and  biliary tree are normal. Pancreas: Normal. Spleen: Normal. Adrenals/Urinary Tract: Adrenal glands are normal. Kidneys are normal in size. A few small bilateral subcentimeter renal cortical hypodensities too small to characterize, but likely cysts. These are unchanged as no further imaging follow-up is recommended. Ureters and bladder are normal. Stomach/Bowel: Stomach and small bowel are normal. Appendix is normal. Moderate diverticulosis of the mid to distal descending and sigmoid colon. There is mild inflammatory change adjacent a couple diverticula over the sigmoid colon compatible with acute diverticulitis. No evidence of perforation or diverticular abscess. Vascular/Lymphatic: Mild calcified plaque over the abdominal aorta which is normal in caliber. No adenopathy. Reproductive: Normal. Other: None. Musculoskeletal: No focal abnormality. IMPRESSION: 1. Evidence of mild acute diverticulitis of the sigmoid colon. No evidence of perforation or diverticular abscess. 2. Few small  bilateral subcentimeter renal cortical hypodensities too small to characterize, but likely cysts. These are unchanged as no further imaging follow-up is recommended. 3. Aortic atherosclerosis. 4. 3 mm nodule over the right middle lobe unchanged from 2022. No further follow-up recommended. Aortic Atherosclerosis (ICD10-I70.0). Electronically Signed   By: Elberta Fortisaniel  Boyle M.D.   On: 01/08/2023 15:07   DG Chest Portable 1 View  Result Date: 01/08/2023 CLINICAL DATA:  fatigue / febrile / tachycardia EXAM: PORTABLE CHEST - 1 VIEW COMPARISON:  09/24/2022 FINDINGS: Cardiac silhouette is unremarkable. No pneumothorax or pleural effusion. The lungs are clear. The visualized skeletal structures are unremarkable. IMPRESSION: No acute cardiopulmonary process. Electronically Signed   By: Layla MawJoshua  Pleasure M.D.   On: 01/08/2023 13:44    Procedures Procedures    Medications Ordered in ED Medications  lactated ringers infusion ( Intravenous New Bag/Given 01/08/23 1633)  piperacillin-tazobactam (ZOSYN) IVPB 3.375 g (3.375 g Intravenous New Bag/Given 01/08/23 1745)    Followed by  piperacillin-tazobactam (ZOSYN) IVPB 3.375 g (has no administration in time range)  lactated ringers bolus 1,000 mL (0 mLs Intravenous Stopped 01/08/23 1739)    And  lactated ringers bolus 1,000 mL (0 mLs Intravenous Stopped 01/08/23 1739)    And  lactated ringers bolus 500 mL (0 mLs Intravenous Stopped 01/08/23 1740)  cefTRIAXone (ROCEPHIN) 2 g in sodium chloride 0.9 % 100 mL IVPB (0 g Intravenous Stopped 01/08/23 1739)  metroNIDAZOLE (FLAGYL) IVPB 500 mg (0 mg Intravenous Stopped 01/08/23 1739)  iohexol (OMNIPAQUE) 300 MG/ML solution 100 mL (100 mLs Intravenous Contrast Given 01/08/23 1437)  ketorolac (TORADOL) 15 MG/ML injection 15 mg (15 mg Intravenous Given 01/08/23 1507)  fentaNYL (SUBLIMAZE) injection 25 mcg (25 mcg Intravenous Given 01/08/23 1735)  ondansetron (ZOFRAN) injection 4 mg (4 mg Intravenous Given 01/08/23 1735)    ED Course/  Medical Decision Making/ A&P Clinical Course as of 01/08/23 1751  Wed Jan 08, 2023  1416 Lactic Acid, Venous(!!): 3.1 [LR]    Clinical Course User Index [LR] Letoya Stallone, Lora PaulaLorin T, PA-C                             Medical Decision Making Amount and/or Complexity of Data Reviewed Labs: ordered. Radiology: ordered.  Risk Prescription drug management.   This patient is a 62 y.o. female who presents to the ED for concern of abdominal pain and fever, this involves an extensive number of treatment options, and is a complaint that carries with it a high risk of complications and morbidity. The emergent differential diagnosis prior to evaluation includes, but is not limited to,  sepsis, pneumonia, intraabdominal infection, UTI/pyelonephritis, PE. This is not an exhaustive  differential.   Past Medical History / Co-morbidities / Social History: anxiety, breast cancer s/p surgery, chemo and radiation; fatty liver, GERD, HLD, HTN, hypothyroidism, osteoporosis, psoriasis, sleep apnea  Additional history: Chart reviewed. Pertinent results include: Echo from 2019 with normal LV EF  Physical Exam: Physical exam performed. The pertinent findings include: Tachycardic in the 130s, tachypneic.  Normal respiratory effort, lung sounds clear.  Abdomen soft with no focal tenderness, no guarding.  Lab Tests: I ordered, and personally interpreted labs.  The pertinent results include: Leukocytosis of 14.6, mild elevation in AST and ALT, initial lactic acid 3.1, repeat 2.4 after IV fluids.  Respiratory panel negative.  Blood cultures pending.  Urinalysis unremarkable.   Imaging Studies: I ordered imaging studies including chest x-ray and CT abdomen pelvis. I independently visualized and interpreted imaging which showed no acute cardiopulmonary abnormalities, evidence of mild diverticulitis of the sigmoid colon. I agree with the radiologist interpretation.   Cardiac Monitoring:  The patient was maintained on a  cardiac monitor.  My attending physician Dr. Deretha Emory viewed and interpreted the cardiac monitored which showed an underlying rhythm of: sinus tachycardia. I agree with this interpretation.   Medications: I ordered medication including IV fluids, empiric antibiotics, fentanyl, Zofran, Toradol for fever, pain, sepsis. Reevaluation of the patient after these medicines showed that the patient improved. I have reviewed the patients home medicines and have made adjustments as needed.  Consultations Obtained: I requested consultation with the hospitalist Dr. Elvera Lennox,  and discussed lab and imaging findings as well as pertinent plan - they recommend: medical admission for sepsis due to diverticulitis   Disposition: After consideration of the diagnostic results and the patients response to treatment, I feel that patient would benefit from admission for sepsis due to diverticulitis.   I discussed this case with my attending physician Dr. Lockie Mola who cosigned this note including patient's presenting symptoms, physical exam, and planned diagnostics and interventions. Attending physician stated agreement with plan or made changes to plan which were implemented.    Final Clinical Impression(s) / ED Diagnoses Final diagnoses:  Sepsis without acute organ dysfunction, due to unspecified organism  Diverticulitis of sigmoid colon    Rx / DC Orders ED Discharge Orders     None      Portions of this report may have been transcribed using voice recognition software. Every effort was made to ensure accuracy; however, inadvertent computerized transcription errors may be present.    Jeanella Flattery 01/08/23 1751    Vanetta Mulders, MD 01/16/23 [phone]

## 2023-01-08 NOTE — ED Notes (Signed)
Blood cultures in the lab.

## 2023-01-08 NOTE — Progress Notes (Signed)
Patient denies OSA and the use of CPAP at home. She states sleep apnea has been suspected but she never followed through with a sleep study of home CPAP use. She does not wish to wear a CPAP at this time or during this admission. Education provided. RT will continue to follow.

## 2023-01-08 NOTE — ED Triage Notes (Signed)
Patient presents to ED via POV from home. Sunday symptoms began. Patient reports generalized body aches, abdominal pain, back pain, chills, nausea and shortness of breath. Tachycardiac in triage. Unable to speak full complete sentences.

## 2023-01-08 NOTE — ED Notes (Signed)
Attempted to call [phone] and unable to take report at this time, will call back in 10 min.

## 2023-01-08 NOTE — ED Notes (Signed)
Left arm is restricted! **

## 2023-01-08 NOTE — ED Notes (Signed)
Called [facility] Transport talked to [place] at [facility]

## 2023-01-08 NOTE — Progress Notes (Signed)
Pharmacy Antibiotic Note  Alexandria Bradley is a 62 y.o. female admitted on 01/08/2023 with  intra-abdominal infection .  Pharmacy has been consulted for Zosyn dosing.  Plan: -Start Zosyn 3.375g IV q8hrs  -Monitor renal function, cultures, and overall clinical picture   Height: 5\' 6"  (167.6 cm) Weight: 76.2 kg (168 lb) IBW/kg (Calculated) : 59.3  Temp (24hrs), Avg:101.5 F (38.6 C), Min:100.6 F (38.1 C), Max:102.5 F (39.2 C)  Recent Labs  Lab 01/08/23 1318 01/08/23 1630  WBC 15.6*  --   CREATININE 0.86  --   LATICACIDVEN 3.1* 2.4*    Estimated Creatinine Clearance: 69 mL/min (by C-G formula based on SCr of 0.86 mg/dL).    Allergies  Allergen Reactions   Chlorhexidine Itching   Ciprofloxacin Anxiety, Other (See Comments), Rash and Swelling    GI upset   Prednisone Shortness Of Breath and Other (See Comments)    Jittery, "jacks me up"     Codeine Nausea Only   Cortisone Swelling and Other (See Comments)    "hyper sensitive, jacks me up"    Colchicine Nausea Only    Antimicrobials this admission: 4/10 Zosyn >>  4/10 Flagyl >> 4/10 ceftriaxone >>  Microbiology results: 4/10 BCx: pending 4/10 resp panel: negative    Thank you for allowing pharmacy to be a part of this patient's care.  Cherylin Mylar 01/08/2023 5:23 PM

## 2023-01-08 NOTE — H&P (Signed)
History and Physical    Alexandria Bradley UEK:[id number] DOB: 10-23-1960 DOA: 01/08/2023  PCP: Sharlene Dory, DO  Patient coming from:  [facility]  I have personally briefly reviewed patient's old medical records in [facility]  Chief Complaint:  body aches , abdominal pain , back pain , chills , nausea   progressive x 4 days  HPI: Alexandria Bradley is a 62 y.o. female with medical history significant of  anxiety, breast cancer s/p surgery, chemo and radiation; fatty liver, GERD, HLD, HTN, hypothyroidism, osteoporosis, psoriasis, sleep apnea  who presents to [facility] with  complaints of abdominal pain , back pain , body aches, chills , and nausea, sob  progressive x 3-4 days. Patient noted continued abdominal pain , low appetite. She notes not current n or vomiting no dysuria.   ED Course:  Temp 101.8, bp 119/87, hr 130, rr 20  Resp panel :neg  Wbc 15.6,  hgb 12.8,  plt 258 Na 132 ( 138) , K 3.3, cl 101, bicarb 21, glu 140, cr 0.86 , ast 81, alt 65 , t-bili 1.8 Lactic 3.1 Cxr: NAD CT abd/pelvis IMPRESSION: 1. Evidence of mild acute diverticulitis of the sigmoid colon. No evidence of perforation or diverticular abscess. 2. Few small bilateral subcentimeter renal cortical hypodensities too small to characterize, but likely cysts. These are unchanged as no further imaging follow-up is recommended. 3. Aortic atherosclerosis. 4. 3 mm nodule over the right middle lobe unchanged from 2022. No further follow-up recommended. Tx: IV fluids, , fentanyl, Zofran, Toradol /ctx /metronidazole LR 1.5 L, zosyn Review of Systems: As per HPI otherwise 10 point review of systems negative.   Past Medical History:  Diagnosis Date   Anxiety    Arthritis    Back pain    Breast cancer    left   Fatty liver    GERD (gastroesophageal reflux disease)    Headache(784.0)    PMH : migraines   History of kidney stones    Hypercholesterolemia    Hypertension    Hypothyroidism    Osteoporosis     Personal history of chemotherapy    Personal history of radiation therapy    PONV (postoperative nausea and vomiting)    difficult to wake up   Pre-diabetes    Psoriasis    PVC's (premature ventricular contractions)    Radiation 08/18/14-10/07/14   Left breast   Sleep apnea    no CPAP    Past Surgical History:  Procedure Laterality Date   BREAST BIOPSY Left 11/10/2018   BREAST LUMPECTOMY Left 2015   BREAST LUMPECTOMY WITH AXILLARY LYMPH NODE BIOPSY  6/15   left   BREAST RECONSTRUCTION WITH PLACEMENT OF TISSUE EXPANDER AND ALLODERM Bilateral 04/12/2019   Procedure: BILATERAL BREAST RECONSTRUCTION WITH PLACEMENT OF TISSUE EXPANDERS; ALLODERM TO RIGHT CHEST;  Surgeon: Glenna Fellows, MD;  [facility]: [facility];  Service: Plastics;  Laterality: Bilateral;   BREAST RECONSTRUCTION WITH PLACEMENT OF TISSUE EXPANDER AND ALLODERM Right 01/24/2020   Procedure: BREAST RECONSTRUCTION WITH PLACEMENT OF TISSUE EXPANDER AND ALLODERM;  Surgeon: Glenna Fellows, MD;  Location: [facility];  Service: Plastics;  Laterality: Right;   CARDIAC CATHETERIZATION     CHOLECYSTECTOMY     colon polyps     COLONOSCOPY N/A 06/27/2014   Procedure: COLONOSCOPY;  Surgeon: Shirley Friar, MD;  Location: [facility]COPY;  Service: Endoscopy;  Laterality: N/A;   LATISSIMUS FLAP TO BREAST Left 04/12/2019   Procedure: LEFT LATISSIMUS DORSI FLAP TO LEFT CHEST;  Surgeon: Glenna Fellowshimmappa, Brinda, MD;  Location: [facility];  Service: Plastics;  Laterality: Left;   LEFT HEART CATH AND CORONARY ANGIOGRAPHY N/A 07/24/2018   Procedure: LEFT HEART CATH AND CORONARY ANGIOGRAPHY;  Surgeon: Lennette BihariKelly, Niti Leisure A, MD;  [facility]: [facility];  Service: Cardiovascular;  Laterality: N/A;   LIPOMA EXCISION     LIPOSUCTION WITH LIPOFILLING Bilateral 06/12/2020   Procedure: LIPOFILLING TO BILATERAL CHEST;  Surgeon: Glenna Fellowshimmappa, Brinda, MD;  Location: [facility];  Service: Plastics;  Laterality: Bilateral;   MASTECTOMY W/  SENTINEL NODE BIOPSY Bilateral 04/12/2019   Procedure: RIGHT BREAST RISK REDUCING MASTECTOMY; LEFT MASTECTOMY WITH LEFT AXILLARY SENTINEL LYMPH NODE BIOPSY WITH BLUE DYE INJECTION;  Surgeon: Emelia LoronWakefield, Matthew, MD;  Location: [facility];  Service: General;  Laterality: Bilateral;   MYOMECTOMY     PORT-A-CATH REMOVAL N/A 02/03/2015   Procedure: REMOVAL PORT-A-CATH;  Surgeon: Emelia LoronMatthew Wakefield, MD;  Location: [facility];  Service: General;  Laterality: N/A;   PORT-A-CATH REMOVAL Right 01/24/2020   Procedure: REMOVAL PORT-A-CATH;  Surgeon: Glenna Fellowshimmappa, Brinda, MD;  Location: [facility];  Service: Plastics;  Laterality: Right;   PORTACATH PLACEMENT N/A 03/01/2014   Procedure: INSERTION PORT-A-CATH;  Surgeon: Emelia LoronMatthew Wakefield, MD;  Location: [facility];  Service: General;  Laterality: N/A;   PORTACATH PLACEMENT N/A 11/18/2018   Procedure: INSERTION PORT-A-CATH WITH ULTRASOUND;  Surgeon: Emelia LoronWakefield, Matthew, MD;  [facility]: [facility];  Service: General;  Laterality: N/A;   REMOVAL OF BILATERAL TISSUE EXPANDERS WITH PLACEMENT OF BILATERAL BREAST IMPLANTS Bilateral 06/12/2020   Procedure: REMOVAL OF BILATERAL TISSUE EXPANDERS WITH PLACEMENT OF BILATERAL BREAST IMPLANTS;  Surgeon: Glenna Fellowshimmappa, Brinda, MD;  Location: [facility];  Service: Plastics;  Laterality: Bilateral;   REMOVAL OF TISSUE EXPANDER AND PLACEMENT OF IMPLANT Right 06/02/2019   Procedure: REMOVAL OF TISSUE EXPANDER RIGHT CHEST;  Surgeon: Glenna Fellowshimmappa, Brinda, MD;  Location: [facility];  Service: Plastics;  Laterality: Right;   TONSILLECTOMY     TUBAL LIGATION     WOUND DEBRIDEMENT Left 06/02/2019   Procedure: LEFT CHEST DEBRIDEMENT;  Surgeon: Glenna Fellowshimmappa, Brinda, MD;  Location: [facility];  Service: Plastics;  Laterality: Left;     reports that she has quit smoking. Her smoking use included cigarettes. She has a 7.50 pack-year smoking history. She has never used smokeless tobacco.  She reports that she does not currently use alcohol. She reports that she does not use drugs.  Allergies  Allergen Reactions   Chlorhexidine Itching   Ciprofloxacin Anxiety, Other (See Comments), Rash and Swelling    GI upset   Prednisone Shortness Of Breath and Other (See Comments)    Jittery, "jacks me up"     Codeine Nausea Only   Cortisone Swelling and Other (See Comments)    "hyper sensitive, jacks me up"    Colchicine Nausea Only    Family History  Problem Relation Age of Onset   Endometrial cancer Mother [id number]   Hearing loss Mother    Atrial fibrillation Mother    Lung cancer Father    Brain cancer Father    Heart disease Maternal Aunt    Atrial fibrillation Maternal Aunt    Lung cancer Maternal Uncle    Atrial fibrillation Maternal Uncle    Stroke Maternal Grandmother    Stroke Maternal Grandfather    Bladder Cancer Maternal Uncle    Multiple myeloma Maternal Uncle    Other Son        trisomy [id number]  Colon polyps Cousin    Colon cancer Neg Hx    Esophageal cancer Neg Hx    Rectal cancer Neg Hx    Stomach cancer Neg Hx     Prior to Admission medications   Medication Sig Start Date End Date Taking? Authorizing Provider  albuterol (VENTOLIN HFA) 108 (90 Base) MCG/ACT inhaler Inhale 2 puffs into the lungs every 6 (six) hours as needed for wheezing or shortness of breath (Cough). 09/24/22  Yes Theadora Rama Scales, PA-C  allopurinol (ZYLOPRIM) 100 MG tablet Take 1 tablet (100 mg total) by mouth daily. 12/17/22  Yes Sharlene Dory, DO  diltiazem (CARDIZEM CD) 240 MG 24 hr capsule Take 1 capsule (240 mg total) by mouth daily. 04/17/22  Yes Wendall Stade, MD  fexofenadine (ALLEGRA) 180 MG tablet Take 180 mg by mouth daily.   Yes [provider]  Levothyroxine Sodium (TIROSINT) 150 MCG CAPS Take 150 mcg by mouth daily before breakfast.   Yes [provider]  liothyronine (CYTOMEL) 5 MCG tablet Take 5 mcg by mouth daily before breakfast.  11/13/13   Yes [provider]  mometasone (NASONEX) 50 MCG/ACT nasal spray Place 2 sprays into the nose daily. 12/17/22  Yes Sharlene Dory, DO  sulfamethoxazole-trimethoprim (BACTRIM DS) 800-160 MG tablet Take 1 tablet by mouth 2 (two) times daily for 7 days. 01/07/23 01/14/23 Yes Wendling, Jilda Roche, DO  triamcinolone cream (KENALOG) 0.1 % Apply 1 application topically 2 (two) times daily. Patient taking differently: Apply 1 application  topically as needed (flares). 11/26/21  Yes Sharlene Dory, DO  triamterene-hydrochlorothiazide (MAXZIDE-25) 37.5-25 MG tablet Take 1 tablet by mouth daily. 05/13/22  Yes Wendall Stade, MD  fluconazole (DIFLUCAN) 150 MG tablet Take 1 tab, repeat in 72 hours if no improvement. Patient not taking: Reported on 01/08/2023 01/07/23   Sharlene Dory, DO  guaifenesin (HUMIBID E) 400 MG TABS tablet Take 1 tablet 3 times daily as needed for chest congestion and cough Patient not taking: Reported on 01/08/2023 09/24/22   Theadora Rama Scales, PA-C  potassium chloride (KLOR-CON 10) 10 MEQ tablet Take 2 tablets (20 mEq total) by mouth daily for 5 days. Patient not taking: Reported on 01/08/2023 12/17/22 01/08/23  Sharlene Dory, DO  venlafaxine XR (EFFEXOR XR) 37.5 MG 24 hr capsule Take 1 capsule (37.5 mg total) by mouth daily with breakfast. Patient not taking: Reported on 01/08/2023 12/17/22   Sharlene Dory, DO  prochlorperazine (COMPAZINE) 10 MG tablet Take 1 tablet (10 mg total) by mouth every 6 (six) hours as needed (Nausea or vomiting). 11/13/18 05/11/19  Magrinat, Valentino Hue, MD    Physical Exam: Vitals:   01/08/23 1602 01/08/23 1700 01/08/23 1757 01/08/23 1908  BP:  (!) 106/51  104/63  Pulse:  86  79  Resp:  18  (!) 22  Temp: (!) 100.6 F (38.1 C)  100.3 F (37.9 C) 98.9 F (37.2 C)  TempSrc: Oral  Oral Oral  SpO2:  94%  100%  Weight:      Height:        Constitutional: NAD, calm, comfortable Vitals:   01/08/23 1602  01/08/23 1700 01/08/23 1757 01/08/23 1908  BP:  (!) 106/51  104/63  Pulse:  86  79  Resp:  18  (!) 22  Temp: (!) 100.6 F (38.1 C)  100.3 F (37.9 C) 98.9 F (37.2 C)  TempSrc: Oral  Oral Oral  SpO2:  94%  100%  Weight:  Height:       Eyes: PERRL, lids and conjunctivae normal ENMT: Mucous membranes are moist. Posterior pharynx clear of any exudate or lesions.Normal dentition.  Neck: normal, supple, no masses,  Respiratory: clear to auscultation bilaterally, no wheezing, no crackles. Normal respiratory effort. No accessory muscle use.  Cardiovascular: Regular rate and rhythm, no murmurs / rubs / gallops. No extremity edema. 2+ pedal pulses. No carotid bruits.  Abdomen: + tenderness lower quad > on left, no masses palpated. No hepatosplenomegaly. Bowel sounds positive.  Musculoskeletal: no clubbing / cyanosis. No joint deformity upper and lower extremities. Good ROM, no contractures. Normal muscle tone.  Skin: no rashes, lesions, ulcers. No induration Neurologic: CN 2-12 grossly intact. Sensation intact,  Strength 5/5 in all 4.  Psychiatric: Normal judgment and insight. Alert and oriented x 3. Normal mood.    Labs on Admission: I have personally reviewed following labs and imaging studies  CBC: Recent Labs  Lab 01/08/23 1318  WBC 15.6*  HGB 12.8  HCT 37.4  MCV 89.5  PLT 258   Basic Metabolic Panel: Recent Labs  Lab 01/08/23 1318  NA 132*  K 3.3*  CL 101  CO2 21*  GLUCOSE 140*  BUN 14  CREATININE 0.86  CALCIUM 8.8*   GFR: Estimated Creatinine Clearance: 69 mL/min (by C-G formula based on SCr of 0.86 mg/dL). Liver Function Tests: Recent Labs  Lab 01/08/23 1318  AST 81*  ALT 65*  ALKPHOS 94  BILITOT 1.8*  PROT 7.4  ALBUMIN 3.7   No results for input(s): "LIPASE", "AMYLASE" in the last 168 hours. No results for input(s): "AMMONIA" in the last 168 hours. Coagulation Profile: No results for input(s): "INR", "PROTIME" in the last 168 hours. Cardiac  Enzymes: No results for input(s): "CKTOTAL", "CKMB", "CKMBINDEX", "TROPONINI" in the last 168 hours. BNP (last 3 results) No results for input(s): "PROBNP" in the last 8760 hours. HbA1C: No results for input(s): "HGBA1C" in the last 72 hours. CBG: No results for input(s): "GLUCAP" in the last 168 hours. Lipid Profile: No results for input(s): "CHOL", "HDL", "LDLCALC", "TRIG", "CHOLHDL", "LDLDIRECT" in the last 72 hours. Thyroid Function Tests: No results for input(s): "TSH", "T4TOTAL", "FREET4", "T3FREE", "THYROIDAB" in the last 72 hours. Anemia Panel: No results for input(s): "VITAMINB12", "FOLATE", "FERRITIN", "TIBC", "IRON", "RETICCTPCT" in the last 72 hours. Urine analysis:    Component Value Date/Time   COLORURINE YELLOW 01/08/2023 1500   APPEARANCEUR CLEAR 01/08/2023 1500   LABSPEC 1.010 01/08/2023 1500   LABSPEC 1.020 06/15/2014 1101   PHURINE 7.0 01/08/2023 1500   GLUCOSEU NEGATIVE 01/08/2023 1500   GLUCOSEU Negative 06/15/2014 1101   HGBUR NEGATIVE 01/08/2023 1500   BILIRUBINUR NEGATIVE 01/08/2023 1500   BILIRUBINUR negative 01/07/2023 1436   BILIRUBINUR Color Interference 06/15/2014 1101   KETONESUR NEGATIVE 01/08/2023 1500   PROTEINUR NEGATIVE 01/08/2023 1500   UROBILINOGEN 0.2 01/07/2023 1436   UROBILINOGEN 0.2 06/15/2014 1101   NITRITE NEGATIVE 01/08/2023 1500   LEUKOCYTESUR NEGATIVE 01/08/2023 1500   LEUKOCYTESUR Trace 06/15/2014 1101    Radiological Exams on Admission: CT ABDOMEN PELVIS W CONTRAST  Result Date: 01/08/2023 CLINICAL DATA:  Sepsis, abdominal pain. Body aches with back pain, chills and fever. Nausea and shortness of breath. Tachycardia. History of breast cancer. EXAM: CT ABDOMEN AND PELVIS WITH CONTRAST TECHNIQUE: Multidetector CT imaging of the abdomen and pelvis was performed using the standard protocol following bolus administration of intravenous contrast. RADIATION DOSE REDUCTION: This exam was performed according to the departmental  dose-optimization program which includes automated  exposure control, adjustment of the mA and/or kV according to patient size and/or use of iterative reconstruction technique. CONTRAST:  OMNIPAQUE IOHEXOL 300 MG/ML  SOLN COMPARISON:  Abdominopelvic CT 03/12/2020, chest CT 04/03/2021 FINDINGS: Lower chest: Lung bases demonstrate no acute findings. 3 mm nodule over the right middle lobe unchanged from 2022. Hepatobiliary: Previous cholecystectomy. Liver and biliary tree are normal. Pancreas: Normal. Spleen: Normal. Adrenals/Urinary Tract: Adrenal glands are normal. Kidneys are normal in size. A few small bilateral subcentimeter renal cortical hypodensities too small to characterize, but likely cysts. These are unchanged as no further imaging follow-up is recommended. Ureters and bladder are normal. Stomach/Bowel: Stomach and small bowel are normal. Appendix is normal. Moderate diverticulosis of the mid to distal descending and sigmoid colon. There is mild inflammatory change adjacent a couple diverticula over the sigmoid colon compatible with acute diverticulitis. No evidence of perforation or diverticular abscess. Vascular/Lymphatic: Mild calcified plaque over the abdominal aorta which is normal in caliber. No adenopathy. Reproductive: Normal. Other: None. Musculoskeletal: No focal abnormality. IMPRESSION: 1. Evidence of mild acute diverticulitis of the sigmoid colon. No evidence of perforation or diverticular abscess. 2. Few small bilateral subcentimeter renal cortical hypodensities too small to characterize, but likely cysts. These are unchanged as no further imaging follow-up is recommended. 3. Aortic atherosclerosis. 4. 3 mm nodule over the right middle lobe unchanged from 2022. No further follow-up recommended. Aortic Atherosclerosis (ICD10-I70.0). Electronically Signed   By: Elberta Fortis M.D.   On: 01/08/2023 15:07   DG Chest Portable 1 View  Result Date: 01/08/2023 CLINICAL DATA:  fatigue / febrile  / tachycardia EXAM: PORTABLE CHEST - 1 VIEW COMPARISON:  09/24/2022 FINDINGS: Cardiac silhouette is unremarkable. No pneumothorax or pleural effusion. The lungs are clear. The visualized skeletal structures are unremarkable. IMPRESSION: No acute cardiopulmonary process. Electronically Signed   By: Layla Maw M.D.   On: 01/08/2023 13:44    EKG: Independently reviewed.   Assessment/Plan  Acute sigmoid Diverticulitis with sepsis w/o shock  -admit to progressive care  - continue on ctx/metronidazole  -f/u on culture data  - supportive care antiemetic ,pain regimen  -s/p 1.5 L in ed continue ivfs  -monitor lactic , inflammatory markers    BRCA  -2015, with recurrence 2020 -s/p adjuvant radiation and chemotherapy. -S/p b/l mastectomies with reconstruction  -currently in remission   Anxiety  -resume regimen  Elevated LFts Fatty liver -noted elevated lfts  -continue to monitor  -possible due to hx of fatty liver as well as sepsis  -continue to monitor    GERD -ppi   HLD -diet controlled   HTN -continue cardizem as bp allows  -hold maxzide currently  Gout  -conitnue allopurinol    Hypothyroidism -continue synthroid  Osteoporosis -no active issues  Psoriasis -no active flare    OSA -cpap as able/ qhs O2    DVT prophylaxis: heparin  Code Status: full/ as discussed per patient wishes in event of cardiac arrest  Family Communication:  Disposition Plan: patient  expected to be admitted greater than 2 midnights  Consults called: n/a Admission status: tele    Lurline Del MD [facility]   If 7PM-7AM, please contact night-coverage [email or web address] Password [facility]  01/08/2023, 8:14 PM

## 2023-01-08 NOTE — ED Notes (Signed)
LR withheld at this time due to the incompatibility with Rocephin.

## 2023-01-08 NOTE — ED Notes (Signed)
Patient transported to CT 

## 2023-01-08 NOTE — ED Notes (Signed)
Blood cultures obtained prior to ABX admin 

## 2023-01-09 DIAGNOSIS — K5792 Diverticulitis of intestine, part unspecified, without perforation or abscess without bleeding: Secondary | ICD-10-CM | POA: Diagnosis present

## 2023-01-09 DIAGNOSIS — Z9221 Personal history of antineoplastic chemotherapy: Secondary | ICD-10-CM | POA: Diagnosis not present

## 2023-01-09 DIAGNOSIS — Z79890 Hormone replacement therapy (postmenopausal): Secondary | ICD-10-CM | POA: Diagnosis not present

## 2023-01-09 DIAGNOSIS — L409 Psoriasis, unspecified: Secondary | ICD-10-CM | POA: Diagnosis present

## 2023-01-09 DIAGNOSIS — E7800 Pure hypercholesterolemia, unspecified: Secondary | ICD-10-CM | POA: Diagnosis present

## 2023-01-09 DIAGNOSIS — G4730 Sleep apnea, unspecified: Secondary | ICD-10-CM | POA: Diagnosis present

## 2023-01-09 DIAGNOSIS — K760 Fatty (change of) liver, not elsewhere classified: Secondary | ICD-10-CM | POA: Diagnosis present

## 2023-01-09 DIAGNOSIS — Z923 Personal history of irradiation: Secondary | ICD-10-CM | POA: Diagnosis not present

## 2023-01-09 DIAGNOSIS — R6520 Severe sepsis without septic shock: Secondary | ICD-10-CM | POA: Diagnosis present

## 2023-01-09 DIAGNOSIS — Z8249 Family history of ischemic heart disease and other diseases of the circulatory system: Secondary | ICD-10-CM | POA: Diagnosis not present

## 2023-01-09 DIAGNOSIS — M810 Age-related osteoporosis without current pathological fracture: Secondary | ICD-10-CM | POA: Diagnosis present

## 2023-01-09 DIAGNOSIS — A419 Sepsis, unspecified organism: Secondary | ICD-10-CM | POA: Diagnosis not present

## 2023-01-09 DIAGNOSIS — K219 Gastro-esophageal reflux disease without esophagitis: Secondary | ICD-10-CM | POA: Diagnosis present

## 2023-01-09 DIAGNOSIS — Z1152 Encounter for screening for COVID-19: Secondary | ICD-10-CM | POA: Diagnosis not present

## 2023-01-09 DIAGNOSIS — I10 Essential (primary) hypertension: Secondary | ICD-10-CM | POA: Diagnosis present

## 2023-01-09 DIAGNOSIS — M109 Gout, unspecified: Secondary | ICD-10-CM | POA: Diagnosis present

## 2023-01-09 DIAGNOSIS — C50912 Malignant neoplasm of unspecified site of left female breast: Secondary | ICD-10-CM | POA: Diagnosis present

## 2023-01-09 DIAGNOSIS — Z853 Personal history of malignant neoplasm of breast: Secondary | ICD-10-CM | POA: Diagnosis not present

## 2023-01-09 DIAGNOSIS — F419 Anxiety disorder, unspecified: Secondary | ICD-10-CM | POA: Diagnosis present

## 2023-01-09 DIAGNOSIS — Z9013 Acquired absence of bilateral breasts and nipples: Secondary | ICD-10-CM | POA: Diagnosis not present

## 2023-01-09 DIAGNOSIS — Z87891 Personal history of nicotine dependence: Secondary | ICD-10-CM | POA: Diagnosis not present

## 2023-01-09 DIAGNOSIS — G4733 Obstructive sleep apnea (adult) (pediatric): Secondary | ICD-10-CM | POA: Diagnosis present

## 2023-01-09 DIAGNOSIS — Z801 Family history of malignant neoplasm of trachea, bronchus and lung: Secondary | ICD-10-CM | POA: Diagnosis not present

## 2023-01-09 DIAGNOSIS — K5732 Diverticulitis of large intestine without perforation or abscess without bleeding: Secondary | ICD-10-CM | POA: Diagnosis present

## 2023-01-09 DIAGNOSIS — E039 Hypothyroidism, unspecified: Secondary | ICD-10-CM | POA: Diagnosis present

## 2023-01-09 LAB — PROCALCITONIN: Procalcitonin: 8.47 ng/mL

## 2023-01-09 LAB — LACTIC ACID, PLASMA: Lactic Acid, Venous: 0.9 mmol/L (ref 0.5–1.9)

## 2023-01-09 LAB — COMPREHENSIVE METABOLIC PANEL
ALT: 47 U/L — ABNORMAL HIGH (ref 0–44)
AST: 38 U/L (ref 15–41)
Albumin: 3.1 g/dL — ABNORMAL LOW (ref 3.5–5.0)
Alkaline Phosphatase: 73 U/L (ref 38–126)
Anion gap: 7 (ref 5–15)
BUN: 11 mg/dL (ref 8–23)
CO2: 25 mmol/L (ref 22–32)
Calcium: 8.5 mg/dL — ABNORMAL LOW (ref 8.9–10.3)
Chloride: 102 mmol/L (ref 98–111)
Creatinine, Ser: 0.69 mg/dL (ref 0.44–1.00)
GFR, Estimated: >60 mL/min (ref >60)
Glucose, Bld: 115 mg/dL — ABNORMAL HIGH (ref 70–99)
Potassium: 3.2 mmol/L — ABNORMAL LOW (ref 3.5–5.1)
Sodium: 134 mmol/L — ABNORMAL LOW (ref 135–145)
Total Bilirubin: 2.7 mg/dL — ABNORMAL HIGH (ref 0.3–1.2)
Total Protein: 6 g/dL — ABNORMAL LOW (ref 6.5–8.1)

## 2023-01-09 LAB — CBC
HCT: 31.5 % — ABNORMAL LOW (ref 36.0–46.0)
Hemoglobin: 10.5 g/dL — ABNORMAL LOW (ref 12.0–15.0)
MCH: 30.8 pg (ref 26.0–34.0)
MCHC: 33.3 g/dL (ref 30.0–36.0)
MCV: 92.4 fL (ref 80.0–100.0)
Platelets: 204 10*3/uL (ref 150–400)
RBC: 3.41 MIL/uL — ABNORMAL LOW (ref 3.87–5.11)
RDW: 13.2 % (ref 11.5–15.5)
WBC: 7.5 10*3/uL (ref 4.0–10.5)
nRBC: 0 % (ref 0.0–0.2)

## 2023-01-09 LAB — T4, FREE: Free T4: 1.21 ng/dL — ABNORMAL HIGH (ref 0.61–1.12)

## 2023-01-09 MED ORDER — SENNOSIDES-DOCUSATE SODIUM 8.6-50 MG PO TABS: 1.0000 | ORAL_TABLET | Freq: Every evening | ORAL | Status: DC | PRN

## 2023-01-09 MED ORDER — LORATADINE 10 MG PO TABS
10.0000 mg | ORAL_TABLET | Freq: Every day | ORAL | Status: DC
  Administered 2023-01-09 – 2023-01-11 (×3): 10 mg via ORAL
  Filled 2023-01-09 (×3): qty 1

## 2023-01-09 MED ORDER — METOCLOPRAMIDE HCL 5 MG/ML IJ SOLN
10.0000 mg | Freq: Three times a day (TID) | INTRAMUSCULAR | Status: DC | PRN
  Administered 2023-01-09: 10 mg via INTRAVENOUS
  Filled 2023-01-09: qty 2

## 2023-01-09 MED ORDER — IPRATROPIUM-ALBUTEROL 0.5-2.5 (3) MG/3ML IN SOLN: 3.0000 mL | RESPIRATORY_TRACT | Status: DC | PRN

## 2023-01-09 MED ORDER — POTASSIUM CHLORIDE 20 MEQ PO PACK
40.0000 meq | PACK | Freq: Once | ORAL | Status: AC
  Administered 2023-01-09: 40 meq via ORAL
  Filled 2023-01-09: qty 2

## 2023-01-09 MED ORDER — HYDRALAZINE HCL 20 MG/ML IJ SOLN: 10.0000 mg | INTRAMUSCULAR | Status: DC | PRN

## 2023-01-09 MED ORDER — TRAZODONE HCL 50 MG PO TABS
50.0000 mg | ORAL_TABLET | Freq: Every evening | ORAL | Status: DC | PRN
  Administered 2023-01-10: 50 mg via ORAL
  Filled 2023-01-09: qty 1

## 2023-01-09 MED ORDER — GUAIFENESIN 100 MG/5ML PO LIQD: 5.0000 mL | ORAL | Status: DC | PRN

## 2023-01-09 MED ORDER — ACETAMINOPHEN 325 MG PO TABS
650.0000 mg | ORAL_TABLET | Freq: Four times a day (QID) | ORAL | Status: DC | PRN
  Administered 2023-01-10 – 2023-01-11 (×3): 650 mg via ORAL
  Filled 2023-01-09 (×3): qty 2

## 2023-01-09 MED ORDER — LACTATED RINGERS IV SOLN: INTRAVENOUS | Status: AC

## 2023-01-09 MED ORDER — METOPROLOL TARTRATE 5 MG/5ML IV SOLN: 5.0000 mg | INTRAVENOUS | Status: DC | PRN

## 2023-01-09 NOTE — Progress Notes (Signed)
   01/08/23 2057  Assess: MEWS Score  Temp (!) 102.9 F (39.4 C)  Assess: MEWS Score  MEWS Temp 2  MEWS Systolic 0  MEWS Pulse 0  MEWS RR 1  MEWS LOC 0  MEWS Score 3  MEWS Score Color Yellow  Assess: if the MEWS score is Yellow or Red  Were vital signs taken at a resting state? Yes  Focused Assessment No change from prior assessment  Does the patient meet 2 or more of the SIRS criteria? Yes  Does the patient have a confirmed or suspected source of infection? No  MEWS guidelines implemented  Yes, yellow  Treat  MEWS Interventions Considered administering scheduled or prn medications/treatments as ordered  Take Vital Signs  Increase Vital Sign Frequency  Yellow: Q2hr x1, continue Q4hrs until patient remains green for 12hrs  Escalate  MEWS: Escalate Yellow: Discuss with charge nurse and consider notifying provider and/or RRT  Notify: Charge Nurse/RN  Name of Charge Nurse/RN Notified Parkin, RN  Provider Notification  Provider Name/Title Llana Aliment Maize  Date Provider Notified 01/08/23  Time Provider Notified 2200  Method of Notification Page  Notification Reason Other (Comment) (temp 102.9)  Provider response En route  Date of Provider Response 01/08/23  Time of Provider Response 2200  Assess: SIRS CRITERIA  SIRS Temperature  1  SIRS Pulse 0  SIRS Respirations  1  SIRS WBC 1  SIRS Score Sum  3

## 2023-01-09 NOTE — Progress Notes (Signed)
  Transition of Care [facility]) Screening Note   Patient Details  Name: Alexandria Bradley Date of Birth: Apr 11, 1961   Transition of Care [facility]) CM/SW Contact:    Adrian Prows, RN Phone Number: 01/09/2023, 9:03 AM    Transition of Care Department [facility]) has reviewed patient and no TOC needs have been identified at this time. We will continue to monitor patient advancement through interdisciplinary progression rounds. If new patient transition needs arise, please place a TOC consult.

## 2023-01-09 NOTE — Progress Notes (Signed)
PROGRESS NOTE    Alexandria Bradley  ZOX:[id number] DOB: 02-10-1961 DOA: 01/08/2023 PCP: Sharlene Dory, DO   Brief Narrative:  62 year old with history of anxiety, breast cancer status post chemo surgery and radiation, fatty liver, GERD, HLD, HTN, hypothyroidism, psoriasis, sleep apnea comes to [facility] with complaints of abdominal pain, fevers and chills ongoing for 3-4 days.  Upon admission she was noted to have sepsis secondary to acute mild uncomplicated diverticulitis of the sigmoid colon   Assessment & Plan:  Principal Problem:   Diverticulitis     Sepsis secondary to acute uncomplicated sigmoid diverticulitis -Sepsis protocol initiated.  Follow culture data.  Continue IV fluids, IV Rocephin/Flagyl, diet as tolerated Will need colonoscopy in 6-8 weeks after she has recovered from this episode  Colonoscopy June 2021 with [facility]-nonbleeding angiodysplastic lesion, mild diverticulosis, patchy mild erythema  BRCA/breast cancer -2015, with recurrence 2020 -s/p adjuvant radiation and chemotherapy. -S/p b/l mastectomies with reconstruction  -currently in remission    Anxiety  -resume regimen   Transaminitis with history of fatty liver -LFTs have improved   GERD - PPI    HLD -Diet controlled    HTN -Home medications on hold.  IV as needed  Gout - Allopurinol   Hypothyroidism -Synthroid - Low TSH, slightly elevated T4.  Follow-up and repeat outpatient with PCP in 4 to 6 weeks   Psoriasis -Stable     OSA -cpap as able/ qhs O2    DVT prophylaxis: Subcu heparin Code Status: Full code Family Communication: Family at bedside Continue hospital stay until abdominal symptoms improved       Diet Orders (From admission, onward)     Start     Ordered   01/08/23 2042  Diet clear liquid Room service appropriate? Yes; Fluid consistency: Thin  Diet effective now       Question Answer Comment  Room service appropriate? Yes   Fluid consistency: Thin       01/08/23 2044            Subjective: Still having a GI with lower abdominal pain.  Poor oral tolerance.   Examination:  General exam: Appears calm and comfortable  Respiratory system: Clear to auscultation. Respiratory effort normal. Cardiovascular system: S1 & S2 heard, RRR. No JVD, murmurs, rubs, gallops or clicks. No pedal edema. Gastrointestinal system: Abdomen is nondistended, soft and abdomen is tender to deep palpation in the lower left quadrant Central nervous system: Alert and oriented. No focal neurological deficits. Extremities: Symmetric 5 x 5 power. Skin: No rashes, lesions or ulcers Psychiatry: Judgement and insight appear normal. Mood & affect appropriate. \  Objective: Vitals:   01/08/23 2237 01/09/23 0232 01/09/23 0500 01/09/23 0635  BP: (!) 107/58 102/63  105/62  Pulse: 89 83  92  Resp: 20 18  20   Temp: 100.2 F (37.9 C) 99.1 F (37.3 C)  (!) 101.2 F (38.4 C)  TempSrc: Oral Oral  Oral  SpO2: 97% 96%  95%  Weight:   82.1 kg   Height:        Intake/Output Summary (Last 24 hours) at 01/09/2023 0728 Last data filed at 01/09/2023 0333 Gross per 24 hour  Intake 866.25 ml  Output --  Net 866.25 ml   Filed Weights   01/08/23 1258 01/08/23 1445 01/09/23 0500  Weight: 75.8 kg 76.2 kg 82.1 kg    Scheduled Meds:  allopurinol  100 mg Oral Daily   heparin  5,000 Units Subcutaneous Q8H   levothyroxine  150 mcg  Oral Q0600   liothyronine  5 mcg Oral QAC breakfast   Continuous Infusions:  cefTRIAXone (ROCEPHIN)  IV     lactated ringers 1,000 mL with potassium chloride 20 mEq infusion 100 mL/hr at 01/08/23 2117   lactated ringers Stopped (01/09/23 0034)   metronidazole Stopped (01/09/23 6144)    Nutritional status     Body mass index is 29.21 kg/m.  Data Reviewed:   CBC: Recent Labs  Lab 01/08/23 1318 01/08/23 2121 01/09/23 0527  WBC 15.6* 9.7 7.5  HGB 12.8 11.5* 10.5*  HCT 37.4 34.7* 31.5*  MCV 89.5 91.3 92.4  PLT 258 218 204   Basic  Metabolic Panel: Recent Labs  Lab 01/08/23 1318 01/08/23 2121 01/09/23 0527  NA 132*  --  134*  K 3.3*  --  3.2*  CL 101  --  102  CO2 21*  --  25  GLUCOSE 140*  --  115*  BUN 14  --  11  CREATININE 0.86 0.79 0.69  CALCIUM 8.8*  --  8.5*   GFR: Estimated Creatinine Clearance: 76.7 mL/min (by C-G formula based on SCr of 0.69 mg/dL). Liver Function Tests: Recent Labs  Lab 01/08/23 1318 01/09/23 0527  AST 81* 38  ALT 65* 47*  ALKPHOS 94 73  BILITOT 1.8* 2.7*  PROT 7.4 6.0*  ALBUMIN 3.7 3.1*   No results for input(s): "LIPASE", "AMYLASE" in the last 168 hours. No results for input(s): "AMMONIA" in the last 168 hours. Coagulation Profile: No results for input(s): "INR", "PROTIME" in the last 168 hours. Cardiac Enzymes: No results for input(s): "CKTOTAL", "CKMB", "CKMBINDEX", "TROPONINI" in the last 168 hours. BNP (last 3 results) No results for input(s): "PROBNP" in the last 8760 hours. HbA1C: No results for input(s): "HGBA1C" in the last 72 hours. CBG: No results for input(s): "GLUCAP" in the last 168 hours. Lipid Profile: No results for input(s): "CHOL", "HDL", "LDLCALC", "TRIG", "CHOLHDL", "LDLDIRECT" in the last 72 hours. Thyroid Function Tests: Recent Labs    01/08/23 2121 01/08/23 2342  TSH 0.012*  --   FREET4  --  1.21*   Anemia Panel: No results for input(s): "VITAMINB12", "FOLATE", "FERRITIN", "TIBC", "IRON", "RETICCTPCT" in the last 72 hours. Sepsis Labs: Recent Labs  Lab 01/08/23 1318 01/08/23 1630 01/08/23 2113 01/08/23 2121 01/08/23 2342  PROCALCITON  --   --   --  8.47  --   LATICACIDVEN 3.1* 2.4* 2.0*  --  0.9    Recent Results (from the past 240 hour(s))  Urine Culture     Status: None   Collection Time: 01/07/23  3:10 PM   Specimen: Urine  Result Value Ref Range Status   MICRO NUMBER: [id number]  Final   SPECIMEN QUALITY: Adequate  Final   Sample Source NOT GIVEN  Final   STATUS: FINAL  Final   Result: No Growth  Final  Resp panel  by RT-PCR (RSV, Flu A&B, Covid) Anterior Nasal Swab     Status: None   Collection Time: 01/08/23  1:06 PM   Specimen: Anterior Nasal Swab  Result Value Ref Range Status   SARS Coronavirus 2 by RT PCR NEGATIVE NEGATIVE Final    Comment: (NOTE) SARS-CoV-2 target nucleic acids are NOT DETECTED.  The SARS-CoV-2 RNA is generally detectable in upper respiratory specimens during the acute phase of infection. The lowest concentration of SARS-CoV-2 viral copies this assay can detect is 138 copies/mL. A negative result does not preclude SARS-Cov-2 infection and should not be used as the sole basis for  treatment or other patient management decisions. A negative result may occur with  improper specimen collection/handling, submission of specimen other than nasopharyngeal swab, presence of viral mutation(s) within the areas targeted by this assay, and inadequate number of viral copies(<138 copies/mL). A negative result must be combined with clinical observations, patient history, and epidemiological information. The expected result is Negative.  Fact Sheet for Patients:  BloggerCourse.comhttps://www.fda.gov/media/152166/download  Fact Sheet for Healthcare Providers:  SeriousBroker.ithttps://www.fda.gov/media/152162/download  This test is no t yet approved or cleared by the [place] FDA and  has been authorized for detection and/or diagnosis of SARS-CoV-2 by FDA under an Emergency Use Authorization (EUA). This EUA will remain  in effect (meaning this test can be used) for the duration of the COVID-19 declaration under Section 564(b)(1) of the Act, 21 U.S.C.section 360bbb-3(b)(1), unless the authorization is terminated  or revoked sooner.       Influenza A by PCR NEGATIVE NEGATIVE Final   Influenza B by PCR NEGATIVE NEGATIVE Final    Comment: (NOTE) The Xpert Xpress SARS-CoV-2/FLU/RSV plus assay is intended as an aid in the diagnosis of influenza from Nasopharyngeal swab specimens and should not be used as a sole  basis for treatment. Nasal washings and aspirates are unacceptable for Xpert Xpress SARS-CoV-2/FLU/RSV testing.  Fact Sheet for Patients: BloggerCourse.comhttps://www.fda.gov/media/152166/download  Fact Sheet for Healthcare Providers: SeriousBroker.ithttps://www.fda.gov/media/152162/download  This test is not yet approved or cleared by the [place] FDA and has been authorized for detection and/or diagnosis of SARS-CoV-2 by FDA under an Emergency Use Authorization (EUA). This EUA will remain in effect (meaning this test can be used) for the duration of the COVID-19 declaration under Section 564(b)(1) of the Act, 21 U.S.C. section 360bbb-3(b)(1), unless the authorization is terminated or revoked.     Resp Syncytial Virus by PCR NEGATIVE NEGATIVE Final    Comment: (NOTE) Fact Sheet for Patients: BloggerCourse.comhttps://www.fda.gov/media/152166/download  Fact Sheet for Healthcare Providers: SeriousBroker.ithttps://www.fda.gov/media/152162/download  This test is not yet approved or cleared by the [place] FDA and has been authorized for detection and/or diagnosis of SARS-CoV-2 by FDA under an Emergency Use Authorization (EUA). This EUA will remain in effect (meaning this test can be used) for the duration of the COVID-19 declaration under Section 564(b)(1) of the Act, 21 U.S.C. section 360bbb-3(b)(1), unless the authorization is terminated or revoked.  Performed at [facility] [place], [address] [place], [place] [address]   Blood culture (routine x 2)     Status: None (Preliminary result)   Collection Time: 01/08/23  1:18 PM   Specimen: BLOOD  Result Value Ref Range Status   Specimen Description   Final    BLOOD RIGHT ANTECUBITAL Performed at [facility] [place], [address] [place], [place] [address]    Special Requests   Final    Blood Culture adequate volume BOTTLES DRAWN AEROBIC AND ANAEROBIC Performed at [facility] [place], [address] [place], [place] [address]    Culture   Final    NO GROWTH  < 24 HOURS Performed at [facility], [address] [place], [place] [address]    Report Status PENDING  Incomplete  Blood culture (routine x 2)     Status: None (Preliminary result)   Collection Time: 01/08/23  1:18 PM   Specimen: BLOOD RIGHT FOREARM  Result Value Ref Range Status   Specimen Description   Final    BLOOD RIGHT FOREARM Performed at [facility], [address] [place], [place]  [address]    Special Requests   Final    Blood Culture adequate volume BOTTLES DRAWN AEROBIC AND ANAEROBIC Performed at [facility], [address] [place], [place] [address]    Culture   Final    NO GROWTH < 24 HOURS Performed at [facility], [address] [place], [place] [address]    Report Status PENDING  Incomplete         Radiology Studies: CT ABDOMEN PELVIS W CONTRAST  Result Date: 01/08/2023 CLINICAL DATA:  Sepsis, abdominal pain. Body aches with back pain, chills and fever. Nausea and shortness of breath. Tachycardia. History of breast cancer. EXAM: CT ABDOMEN AND PELVIS WITH CONTRAST TECHNIQUE: Multidetector CT imaging of the abdomen and pelvis was performed using the standard protocol following bolus administration of intravenous contrast. RADIATION DOSE REDUCTION: This exam was performed according to the departmental dose-optimization program which includes automated exposure control, adjustment of the mA and/or kV according to patient size and/or use of iterative reconstruction technique. CONTRAST:  OMNIPAQUE IOHEXOL 300 MG/ML  SOLN COMPARISON:  Abdominopelvic CT 03/12/2020, chest CT 04/03/2021 FINDINGS: Lower chest: Lung bases demonstrate no acute findings. 3 mm nodule over the right middle lobe unchanged from 2022. Hepatobiliary: Previous cholecystectomy. Liver and biliary tree are normal. Pancreas: Normal. Spleen: Normal. Adrenals/Urinary Tract: Adrenal glands are normal. Kidneys are normal in size. A few small bilateral subcentimeter renal  cortical hypodensities too small to characterize, but likely cysts. These are unchanged as no further imaging follow-up is recommended. Ureters and bladder are normal. Stomach/Bowel: Stomach and small bowel are normal. Appendix is normal. Moderate diverticulosis of the mid to distal descending and sigmoid colon. There is mild inflammatory change adjacent a couple diverticula over the sigmoid colon compatible with acute diverticulitis. No evidence of perforation or diverticular abscess. Vascular/Lymphatic: Mild calcified plaque over the abdominal aorta which is normal in caliber. No adenopathy. Reproductive: Normal. Other: None. Musculoskeletal: No focal abnormality. IMPRESSION: 1. Evidence of mild acute diverticulitis of the sigmoid colon. No evidence of perforation or diverticular abscess. 2. Few small bilateral subcentimeter renal cortical hypodensities too small to characterize, but likely cysts. These are unchanged as no further imaging follow-up is recommended. 3. Aortic atherosclerosis. 4. 3 mm nodule over the right middle lobe unchanged from 2022. No further follow-up recommended. Aortic Atherosclerosis (ICD10-I70.0). Electronically Signed   By: Elberta Fortis M.D.   On: 01/08/2023 15:07   DG Chest Portable 1 View  Result Date: 01/08/2023 CLINICAL DATA:  fatigue / febrile / tachycardia EXAM: PORTABLE CHEST - 1 VIEW COMPARISON:  09/24/2022 FINDINGS: Cardiac silhouette is unremarkable. No pneumothorax or pleural effusion. The lungs are clear. The visualized skeletal structures are unremarkable. IMPRESSION: No acute cardiopulmonary process. Electronically Signed   By: Layla Maw M.D.   On: 01/08/2023 13:44           LOS: 0 days   Time spent= 35 mins    Korion Cuevas Joline Maxcy, MD [facility]  If 7PM-7AM, please contact night-coverage  01/09/2023, 7:28 AM

## 2023-01-10 ENCOUNTER — Encounter (HOSPITAL_COMMUNITY): Payer: Self-pay | Admitting: Emergency Medicine

## 2023-01-10 DIAGNOSIS — A419 Sepsis, unspecified organism: Secondary | ICD-10-CM | POA: Diagnosis not present

## 2023-01-10 LAB — CBC
HCT: 30.4 % — ABNORMAL LOW (ref 36.0–46.0)
Hemoglobin: 9.8 g/dL — ABNORMAL LOW (ref 12.0–15.0)
MCH: 30.4 pg (ref 26.0–34.0)
MCHC: 32.2 g/dL (ref 30.0–36.0)
MCV: 94.4 fL (ref 80.0–100.0)
Platelets: 209 10*3/uL (ref 150–400)
RBC: 3.22 MIL/uL — ABNORMAL LOW (ref 3.87–5.11)
RDW: 13.5 % (ref 11.5–15.5)
WBC: 3.8 10*3/uL — ABNORMAL LOW (ref 4.0–10.5)
nRBC: 0 % (ref 0.0–0.2)

## 2023-01-10 LAB — BASIC METABOLIC PANEL
Anion gap: 9 (ref 5–15)
BUN: 8 mg/dL (ref 8–23)
CO2: 22 mmol/L (ref 22–32)
Calcium: 8.4 mg/dL — ABNORMAL LOW (ref 8.9–10.3)
Chloride: 105 mmol/L (ref 98–111)
Creatinine, Ser: 0.56 mg/dL (ref 0.44–1.00)
GFR, Estimated: >60 mL/min (ref >60)
Glucose, Bld: 105 mg/dL — ABNORMAL HIGH (ref 70–99)
Potassium: 3.3 mmol/L — ABNORMAL LOW (ref 3.5–5.1)
Sodium: 136 mmol/L (ref 135–145)

## 2023-01-10 LAB — CULTURE, BLOOD (ROUTINE X 2): Special Requests: ADEQUATE

## 2023-01-10 LAB — HIV ANTIBODY (ROUTINE TESTING W REFLEX): HIV Screen 4th Generation wRfx: NONREACTIVE

## 2023-01-10 LAB — MAGNESIUM: Magnesium: 2 mg/dL (ref 1.7–2.4)

## 2023-01-10 MED ORDER — POTASSIUM CHLORIDE CRYS ER 20 MEQ PO TBCR
40.0000 meq | EXTENDED_RELEASE_TABLET | Freq: Two times a day (BID) | ORAL | Status: AC
  Administered 2023-01-10 (×2): 40 meq via ORAL
  Filled 2023-01-10 (×2): qty 2

## 2023-01-10 MED ORDER — POTASSIUM CHLORIDE 20 MEQ PO PACK
40.0000 meq | PACK | ORAL | Status: DC
  Filled 2023-01-10: qty 2

## 2023-01-10 MED ORDER — VENLAFAXINE HCL ER 37.5 MG PO CP24
37.5000 mg | ORAL_CAPSULE | Freq: Every day | ORAL | Status: DC
  Administered 2023-01-11: 37.5 mg via ORAL
  Filled 2023-01-10: qty 1

## 2023-01-10 NOTE — Progress Notes (Signed)
PROGRESS NOTE    Alexandria Bradley  UJW:[id number] DOB: 09/15/1961 DOA: 01/08/2023 PCP: Sharlene Dory, DO   Brief Narrative:  62 year old with history of anxiety, breast cancer status post chemo surgery and radiation, fatty liver, GERD, HLD, HTN, hypothyroidism, psoriasis, sleep apnea comes to [facility] with complaints of abdominal pain, fevers and chills ongoing for 3-4 days.  Upon admission she was noted to have sepsis secondary to acute mild uncomplicated diverticulitis of the sigmoid colon   Assessment & Plan:  Principal Problem:   Sepsis Active Problems:   Hypothyroidism   HLD (hyperlipidemia)   Essential hypertension   PSORIASIS   Recurrent cancer of left breast   Diverticulitis     Sepsis secondary to acute uncomplicated sigmoid diverticulitis - Sepsis physiology improved, continue to follow culture data.  IV Rocephin/Flagyl - Will advance diet to full liquid Will need colonoscopy in 6-8 weeks after she has recovered from this episode  Colonoscopy June 2021 with [facility]-nonbleeding angiodysplastic lesion, mild diverticulosis, patchy mild erythema  BRCA/breast cancer -2015, with recurrence 2020 -s/p adjuvant radiation and chemotherapy. -S/p b/l mastectomies with reconstruction  -currently in remission    Anxiety  -resume regimen   Transaminitis with history of fatty liver -LFTs have improved   GERD - PPI    HLD -Diet controlled    HTN -Home medications on hold.  IV as needed  Gout - Allopurinol   Hypothyroidism -Synthroid - Low TSH, slightly elevated T4.  Follow-up and repeat outpatient with PCP in 4 to 6 weeks   Psoriasis -Stable     OSA -cpap as able/ qhs O2    DVT prophylaxis: Subcu heparin Code Status: Full code Family Communication:  Still nauseous but slowly improving     Diet Orders (From admission, onward)     Start     Ordered   01/10/23 0754  Diet full liquid Room service appropriate? Yes; Fluid consistency: Thin  Diet  effective now       Question Answer Comment  Room service appropriate? Yes   Fluid consistency: Thin      01/10/23 0753            Subjective: Nausea has improved by a little bit.  Would like to try more food Low-grade fevers  Examination: Constitutional: Not in acute distress Respiratory: Clear to auscultation bilaterally Cardiovascular: Normal sinus rhythm, no rubs Abdomen: Abdomen slightly tender in the lower quadrant but improved compared to yesterday Musculoskeletal: No edema noted Skin: No rashes seen Neurologic: CN 2-12 grossly intact.  And nonfocal Psychiatric: Normal judgment and insight. Alert and oriented x 3. Normal mood.   Objective: Vitals:   01/09/23 1935 01/10/23 0339 01/10/23 0340 01/10/23 0631  BP:  (!) 131/52 (!) 131/52 127/72  Pulse:  69 69 63  Resp:  18  17  Temp: 99.2 F (37.3 C) 98.2 F (36.8 C) 98.2 F (36.8 C) 98.6 F (37 C)  TempSrc: Oral Oral Oral Oral  SpO2:  92% 93% 97%  Weight:    80.5 kg  Height:        Intake/Output Summary (Last 24 hours) at 01/10/2023 0753 Last data filed at 01/10/2023 0644 Gross per 24 hour  Intake 2857.48 ml  Output --  Net 2857.48 ml   Filed Weights   01/08/23 1445 01/09/23 0500 01/10/23 0631  Weight: 76.2 kg 82.1 kg 80.5 kg    Scheduled Meds:  allopurinol  100 mg Oral Daily   heparin  5,000 Units Subcutaneous Q8H   levothyroxine  150 mcg Oral Q0600   liothyronine  5 mcg Oral QAC breakfast   loratadine  10 mg Oral Daily   potassium chloride  40 mEq Oral Q4H   Continuous Infusions:  cefTRIAXone (ROCEPHIN)  IV Stopped (01/09/23 1517)   lactated ringers 100 mL/hr at 01/10/23 0343   metronidazole 500 mg (01/10/23 0331)    Nutritional status     Body mass index is 28.64 kg/m.  Data Reviewed:   CBC: Recent Labs  Lab 01/08/23 1318 01/08/23 2121 01/09/23 0527 01/10/23 0559  WBC 15.6* 9.7 7.5 3.8*  HGB 12.8 11.5* 10.5* 9.8*  HCT 37.4 34.7* 31.5* 30.4*  MCV 89.5 91.3 92.4 94.4  PLT 258  218 204 209   Basic Metabolic Panel: Recent Labs  Lab 01/08/23 1318 01/08/23 2121 01/09/23 0527 01/10/23 0559  NA 132*  --  134* 136  K 3.3*  --  3.2* 3.3*  CL 101  --  102 105  CO2 21*  --  25 22  GLUCOSE 140*  --  115* 105*  BUN 14  --  11 8  CREATININE 0.86 0.79 0.69 0.56  CALCIUM 8.8*  --  8.5* 8.4*  MG  --   --   --  2.0   GFR: Estimated Creatinine Clearance: 76 mL/min (by C-G formula based on SCr of 0.56 mg/dL). Liver Function Tests: Recent Labs  Lab 01/08/23 1318 01/09/23 0527  AST 81* 38  ALT 65* 47*  ALKPHOS 94 73  BILITOT 1.8* 2.7*  PROT 7.4 6.0*  ALBUMIN 3.7 3.1*   No results for input(s): "LIPASE", "AMYLASE" in the last 168 hours. No results for input(s): "AMMONIA" in the last 168 hours. Coagulation Profile: No results for input(s): "INR", "PROTIME" in the last 168 hours. Cardiac Enzymes: No results for input(s): "CKTOTAL", "CKMB", "CKMBINDEX", "TROPONINI" in the last 168 hours. BNP (last 3 results) No results for input(s): "PROBNP" in the last 8760 hours. HbA1C: No results for input(s): "HGBA1C" in the last 72 hours. CBG: No results for input(s): "GLUCAP" in the last 168 hours. Lipid Profile: No results for input(s): "CHOL", "HDL", "LDLCALC", "TRIG", "CHOLHDL", "LDLDIRECT" in the last 72 hours. Thyroid Function Tests: Recent Labs    01/08/23 2121 01/08/23 2342  TSH 0.012*  --   FREET4  --  1.21*   Anemia Panel: No results for input(s): "VITAMINB12", "FOLATE", "FERRITIN", "TIBC", "IRON", "RETICCTPCT" in the last 72 hours. Sepsis Labs: Recent Labs  Lab 01/08/23 1318 01/08/23 1630 01/08/23 2113 01/08/23 2121 01/08/23 2342  PROCALCITON  --   --   --  8.47  --   LATICACIDVEN 3.1* 2.4* 2.0*  --  0.9    Recent Results (from the past 240 hour(s))  Urine Culture     Status: None   Collection Time: 01/07/23  3:10 PM   Specimen: Urine  Result Value Ref Range Status   MICRO NUMBER: [id number]  Final   SPECIMEN QUALITY: Adequate  Final    Sample Source NOT GIVEN  Final   STATUS: FINAL  Final   Result: No Growth  Final  Resp panel by RT-PCR (RSV, Flu A&B, Covid) Anterior Nasal Swab     Status: None   Collection Time: 01/08/23  1:06 PM   Specimen: Anterior Nasal Swab  Result Value Ref Range Status   SARS Coronavirus 2 by RT PCR NEGATIVE NEGATIVE Final    Comment: (NOTE) SARS-CoV-2 target nucleic acids are NOT DETECTED.  The SARS-CoV-2 RNA is generally detectable in upper respiratory specimens during the acute  phase of infection. The lowest concentration of SARS-CoV-2 viral copies this assay can detect is 138 copies/mL. A negative result does not preclude SARS-Cov-2 infection and should not be used as the sole basis for treatment or other patient management decisions. A negative result may occur with  improper specimen collection/handling, submission of specimen other than nasopharyngeal swab, presence of viral mutation(s) within the areas targeted by this assay, and inadequate number of viral copies(<138 copies/mL). A negative result must be combined with clinical observations, patient history, and epidemiological information. The expected result is Negative.  Fact Sheet for Patients:  BloggerCourse.com  Fact Sheet for Healthcare Providers:  SeriousBroker.it  This test is no t yet approved or cleared by the [place] FDA and  has been authorized for detection and/or diagnosis of SARS-CoV-2 by FDA under an Emergency Use Authorization (EUA). This EUA will remain  in effect (meaning this test can be used) for the duration of the COVID-19 declaration under Section 564(b)(1) of the Act, 21 U.S.C.section 360bbb-3(b)(1), unless the authorization is terminated  or revoked sooner.       Influenza A by PCR NEGATIVE NEGATIVE Final   Influenza B by PCR NEGATIVE NEGATIVE Final    Comment: (NOTE) The Xpert Xpress SARS-CoV-2/FLU/RSV plus assay is intended as an aid in the  diagnosis of influenza from Nasopharyngeal swab specimens and should not be used as a sole basis for treatment. Nasal washings and aspirates are unacceptable for Xpert Xpress SARS-CoV-2/FLU/RSV testing.  Fact Sheet for Patients: BloggerCourse.com  Fact Sheet for Healthcare Providers: SeriousBroker.it  This test is not yet approved or cleared by the [place] FDA and has been authorized for detection and/or diagnosis of SARS-CoV-2 by FDA under an Emergency Use Authorization (EUA). This EUA will remain in effect (meaning this test can be used) for the duration of the COVID-19 declaration under Section 564(b)(1) of the Act, 21 U.S.C. section 360bbb-3(b)(1), unless the authorization is terminated or revoked.     Resp Syncytial Virus by PCR NEGATIVE NEGATIVE Final    Comment: (NOTE) Fact Sheet for Patients: BloggerCourse.com  Fact Sheet for Healthcare Providers: SeriousBroker.it  This test is not yet approved or cleared by the [place] FDA and has been authorized for detection and/or diagnosis of SARS-CoV-2 by FDA under an Emergency Use Authorization (EUA). This EUA will remain in effect (meaning this test can be used) for the duration of the COVID-19 declaration under Section 564(b)(1) of the Act, 21 U.S.C. section 360bbb-3(b)(1), unless the authorization is terminated or revoked.  Performed at [facility], [address] [place], [place] [address]   Blood culture (routine x 2)     Status: None (Preliminary result)   Collection Time: 01/08/23  1:18 PM   Specimen: BLOOD  Result Value Ref Range Status   Specimen Description   Final    BLOOD RIGHT ANTECUBITAL Performed at [facility], [address] [place], [place] [address]    Special Requests   Final    Blood Culture adequate volume BOTTLES DRAWN AEROBIC AND ANAEROBIC Performed at [facility], [address] [place], [place] [address]    Culture   Final    NO GROWTH 2 DAYS Performed at [facility], [address], [place], [place] [address]    Report Status PENDING  Incomplete  Blood culture (routine x 2)     Status: None (Preliminary result)   Collection Time: 01/08/23  1:18 PM   Specimen:  BLOOD RIGHT FOREARM  Result Value Ref Range Status   Specimen Description   Final    BLOOD RIGHT FOREARM Performed at [facility], [address], [place], [place] [address]    Special Requests   Final    Blood Culture adequate volume BOTTLES DRAWN AEROBIC AND ANAEROBIC Performed at [facility], [address] [place], [place] [address]    Culture   Final    NO GROWTH 2 DAYS Performed at [facility], [address] [place], [place] [address]    Report Status PENDING  Incomplete  Culture, blood (single) w Reflex to ID Panel     Status: None (Preliminary result)   Collection Time: 01/08/23 11:42 PM   Specimen: BLOOD  Result Value Ref Range Status   Specimen Description   Final    BLOOD SITE NOT SPECIFIED Performed at [facility], [address] [place], [place] [address]    Special Requests   Final    BOTTLES DRAWN AEROBIC ONLY Blood Culture adequate volume Performed at [facility], [address], [place], [place] [address]    Culture   Final    NO GROWTH 1 DAY Performed at [facility], [address] [place], [place] [address]    Report Status PENDING  Incomplete         Radiology Studies: CT ABDOMEN PELVIS W CONTRAST  Result Date: 01/08/2023 CLINICAL DATA:  Sepsis, abdominal pain. Body aches with back pain, chills and fever. Nausea and shortness of breath. Tachycardia. History of breast cancer. EXAM: CT ABDOMEN AND PELVIS WITH CONTRAST TECHNIQUE: Multidetector CT imaging of the abdomen and pelvis was performed using the standard protocol following bolus administration of  intravenous contrast. RADIATION DOSE REDUCTION: This exam was performed according to the departmental dose-optimization program which includes automated exposure control, adjustment of the mA and/or kV according to patient size and/or use of iterative reconstruction technique. CONTRAST:  OMNIPAQUE IOHEXOL 300 MG/ML  SOLN COMPARISON:  Abdominopelvic CT 03/12/2020, chest CT 04/03/2021 FINDINGS: Lower chest: Lung bases demonstrate no acute findings. 3 mm nodule over the right middle lobe unchanged from 2022. Hepatobiliary: Previous cholecystectomy. Liver and biliary tree are normal. Pancreas: Normal. Spleen: Normal. Adrenals/Urinary Tract: Adrenal glands are normal. Kidneys are normal in size. A few small bilateral subcentimeter renal cortical hypodensities too small to characterize, but likely cysts. These are unchanged as no further imaging follow-up is recommended. Ureters and bladder are normal. Stomach/Bowel: Stomach and small bowel are normal. Appendix is normal. Moderate diverticulosis of the mid to distal descending and sigmoid colon. There is mild inflammatory change adjacent a couple diverticula over the sigmoid colon compatible with acute diverticulitis. No evidence of perforation or diverticular abscess. Vascular/Lymphatic: Mild calcified plaque over the abdominal aorta which is normal in caliber. No adenopathy. Reproductive: Normal. Other: None. Musculoskeletal: No focal abnormality. IMPRESSION: 1. Evidence of mild acute diverticulitis of the sigmoid colon. No evidence of perforation or diverticular abscess. 2. Few small bilateral subcentimeter renal cortical hypodensities too small to characterize, but likely cysts. These are unchanged as no further imaging follow-up is recommended. 3. Aortic atherosclerosis. 4. 3 mm nodule over the right middle lobe unchanged from 2022. No further follow-up recommended. Aortic Atherosclerosis (ICD10-I70.0). Electronically Signed   By: Elberta Fortis M.D.   On:  01/08/2023 15:07   DG Chest Portable 1 View  Result Date: 01/08/2023 CLINICAL DATA:  fatigue / febrile / tachycardia EXAM: PORTABLE CHEST -  1 VIEW COMPARISON:  09/24/2022 FINDINGS: Cardiac silhouette is unremarkable. No pneumothorax or pleural effusion. The lungs are clear. The visualized skeletal structures are unremarkable. IMPRESSION: No acute cardiopulmonary process. Electronically Signed   By: Layla Maw M.D.   On: 01/08/2023 13:44           LOS: 1 day   Time spent= 35 mins    Audra Kagel Joline Maxcy, MD [facility]  If 7PM-7AM, please contact night-coverage  01/10/2023, 7:53 AM

## 2023-01-10 NOTE — Plan of Care (Signed)
  Problem: Clinical Measurements: Goal: Respiratory complications will improve Outcome: Adequate for Discharge Goal: Cardiovascular complication will be avoided Outcome: Adequate for Discharge   Problem: Coping: Goal: Level of anxiety will decrease Outcome: Adequate for Discharge   Problem: Elimination: Goal: Will not experience complications related to urinary retention Outcome: Adequate for Discharge

## 2023-01-10 NOTE — Plan of Care (Signed)
  Problem: Nutrition: Goal: Adequate nutrition will be maintained Outcome: Progressing   Problem: Clinical Measurements: Goal: Will remain free from infection Outcome: Adequate for Discharge   Problem: Education: Goal: Knowledge of General Education information will improve Description: Including pain rating scale, medication(s)/side effects and non-pharmacologic comfort measures Outcome: Completed/Met   Problem: Clinical Measurements: Goal: Ability to maintain clinical measurements within normal limits will improve Outcome: Completed/Met   Problem: Activity: Goal: Risk for activity intolerance will decrease Outcome: Completed/Met   Problem: Pain Managment: Goal: General experience of comfort will improve Outcome: Completed/Met   Problem: Safety: Goal: Ability to remain free from injury will improve Outcome: Completed/Met   Problem: Skin Integrity: Goal: Risk for impaired skin integrity will decrease Outcome: Completed/Met

## 2023-01-11 DIAGNOSIS — A419 Sepsis, unspecified organism: Secondary | ICD-10-CM | POA: Diagnosis not present

## 2023-01-11 LAB — BASIC METABOLIC PANEL
Anion gap: 9 (ref 5–15)
BUN: 7 mg/dL — ABNORMAL LOW (ref 8–23)
CO2: 23 mmol/L (ref 22–32)
Calcium: 8.7 mg/dL — ABNORMAL LOW (ref 8.9–10.3)
Chloride: 107 mmol/L (ref 98–111)
Creatinine, Ser: 0.62 mg/dL (ref 0.44–1.00)
GFR, Estimated: >60 mL/min (ref >60)
Glucose, Bld: 97 mg/dL (ref 70–99)
Potassium: 4 mmol/L (ref 3.5–5.1)
Sodium: 139 mmol/L (ref 135–145)

## 2023-01-11 LAB — CBC
HCT: 31.8 % — ABNORMAL LOW (ref 36.0–46.0)
Hemoglobin: 10.2 g/dL — ABNORMAL LOW (ref 12.0–15.0)
MCH: 30.4 pg (ref 26.0–34.0)
MCHC: 32.1 g/dL (ref 30.0–36.0)
MCV: 94.6 fL (ref 80.0–100.0)
Platelets: 244 10*3/uL (ref 150–400)
RBC: 3.36 MIL/uL — ABNORMAL LOW (ref 3.87–5.11)
RDW: 13.4 % (ref 11.5–15.5)
WBC: 3.2 10*3/uL — ABNORMAL LOW (ref 4.0–10.5)
nRBC: 0 % (ref 0.0–0.2)

## 2023-01-11 LAB — MAGNESIUM: Magnesium: 2 mg/dL (ref 1.7–2.4)

## 2023-01-11 LAB — CULTURE, BLOOD (SINGLE)

## 2023-01-11 LAB — CULTURE, BLOOD (ROUTINE X 2): Culture: NO GROWTH

## 2023-01-11 LAB — T3: T3, Total: 60 ng/dL — ABNORMAL LOW (ref 71–180)

## 2023-01-11 MED ORDER — ORAL CARE MOUTH RINSE: 15.0000 mL | OROMUCOSAL | Status: DC | PRN

## 2023-01-11 MED ORDER — ONDANSETRON 4 MG PO TBDP: 4.0000 mg | ORAL_TABLET | Freq: Three times a day (TID) | ORAL | 0 refills | Status: DC | PRN

## 2023-01-11 MED ORDER — AMOXICILLIN-POT CLAVULANATE 875-125 MG PO TABS: 1.0000 | ORAL_TABLET | Freq: Two times a day (BID) | ORAL | 0 refills | Status: AC

## 2023-01-11 NOTE — Plan of Care (Signed)
  Problem: Safety: Goal: Ability to remain free from injury will improve Outcome: Progressing   Problem: Pain Managment: Goal: General experience of comfort will improve Outcome: Progressing   Problem: Coping: Goal: Level of anxiety will decrease Outcome: Progressing   Problem: Activity: Goal: Risk for activity intolerance will decrease Outcome: Progressing   Problem: Nutrition: Goal: Adequate nutrition will be maintained Outcome: Progressing

## 2023-01-11 NOTE — Discharge Summary (Signed)
Physician Discharge Summary  NILANI HUGILL ZOX:[id number] DOB: 07-11-1961 DOA: 01/08/2023  PCP: Sharlene Dory, DO  Admit date: 01/08/2023 Discharge date: 01/11/2023  Admitted From: Home Disposition: Home  Recommendations for Outpatient Follow-up:  Follow up with PCP in 1-2 weeks Please obtain BMP/CBC in one week your next doctors visit.  Slowly advance diet as tolerated at home Oral Augmentin for 7 more days.  This should complete total 7-day course As needed Zofran Colonoscopy in about 8 weeks Repeat thyroid function test with PCP in about 4 to 6 weeks   Discharge Condition: Stable CODE STATUS: Full code  diet recommendation: Soft diet  Brief/Interim Summary: 62 year old with history of anxiety, breast cancer status post chemo surgery and radiation, fatty liver, GERD, HLD, HTN, hypothyroidism, psoriasis, sleep apnea comes to [facility] with complaints of abdominal pain, fevers and chills ongoing for 3-4 days.  Upon admission she was noted to have sepsis secondary to acute mild uncomplicated diverticulitis of the sigmoid colon. Over the course of 3 days in the hospital patient started doing significantly well, lower abdominal pain significantly improved and she was tolerating orals without any issues.  She remained afebrile.  Today she is medically stable for discharge.     Assessment & Plan:  Principal Problem:   Sepsis Active Problems:   Hypothyroidism   HLD (hyperlipidemia)   Essential hypertension   PSORIASIS   Recurrent cancer of left breast   Diverticulitis      Sepsis secondary to acute uncomplicated sigmoid diverticulitis - Sepsis physiology is resolved.  She is tolerating oral without any issues.  Her abdominal pain has significantly improved. -Advised soft/low residue diet at home Will need colonoscopy in 6-8 weeks after she has recovered from this episode   Colonoscopy June 2021 with [facility]-nonbleeding angiodysplastic lesion, mild diverticulosis, patchy  mild erythema   BRCA/breast cancer -2015, with recurrence 2020 -s/p adjuvant radiation and chemotherapy. -S/p b/l mastectomies with reconstruction  -currently in remission    Anxiety  -resume regimen   Transaminitis with history of fatty liver -LFTs have improved   GERD - PPI    HLD -Diet controlled    HTN Resume home medications  Gout - Allopurinol   Hypothyroidism -Synthroid - Low TSH, slightly elevated T4.  Follow-up and repeat outpatient with PCP in 4 to 6 weeks   Psoriasis -Stable     OSA -cpap as able/ qhs O2       Discharge Diagnoses:  Principal Problem:   Sepsis Active Problems:   Hypothyroidism   HLD (hyperlipidemia)   Essential hypertension   PSORIASIS   Recurrent cancer of left breast   Diverticulitis      Consultations: None  Subjective: Feels well no complaints.  Abdominal pain has significantly improved.  She is tolerating orals  Discharge Exam: Vitals:   01/10/23 2106 01/11/23 0359  BP: (!) 145/72 (!) 126/58  Pulse: 62 (!) 50  Resp: 20 16  Temp: 98.2 F (36.8 C) 97.7 F (36.5 C)  SpO2: 96% 98%   Vitals:   01/10/23 2106 01/11/23 0359 01/11/23 0438 01/11/23 0623  BP: (!) 145/72 (!) 126/58    Pulse: 62 (!) 50    Resp: 20 16    Temp: 98.2 F (36.8 C) 97.7 F (36.5 C)    TempSrc: Oral Oral    SpO2: 96% 98%    Weight:   81.2 kg 78.7 kg  Height:        General: Pt is alert, awake, not in acute distress Cardiovascular:  RRR, S1/S2 +, no rubs, no gallops Respiratory: CTA bilaterally, no wheezing, no rhonchi Abdominal: Soft, NT, ND, bowel sounds + Extremities: no edema, no cyanosis  Discharge Instructions   Allergies as of 01/11/2023       Reactions   Chlorhexidine Itching   Ciprofloxacin Anxiety, Other (See Comments), Rash, Swelling   GI upset   Prednisone Shortness Of Breath, Other (See Comments)   Jittery, "jacks me up"    Codeine Nausea Only   Cortisone Swelling, Other (See Comments)   "hyper sensitive,  jacks me up"   Colchicine Nausea Only        Medication List     STOP taking these medications    fluconazole 150 MG tablet Commonly known as: DIFLUCAN   potassium chloride 10 MEQ tablet Commonly known as: Klor-Con 10   sulfamethoxazole-trimethoprim 800-160 MG tablet Commonly known as: BACTRIM DS       TAKE these medications    albuterol 108 (90 Base) MCG/ACT inhaler Commonly known as: VENTOLIN HFA Inhale 2 puffs into the lungs every 6 (six) hours as needed for wheezing or shortness of breath (Cough).   allopurinol 100 MG tablet Commonly known as: ZYLOPRIM Take 1 tablet (100 mg total) by mouth daily.   amoxicillin-clavulanate 875-125 MG tablet Commonly known as: AUGMENTIN Take 1 tablet by mouth 2 (two) times daily for 7 days.   diltiazem 240 MG 24 hr capsule Commonly known as: CARDIZEM CD Take 1 capsule (240 mg total) by mouth daily.   fexofenadine 180 MG tablet Commonly known as: ALLEGRA Take 180 mg by mouth daily.   guaifenesin 400 MG Tabs tablet Commonly known as: HUMIBID E Take 1 tablet 3 times daily as needed for chest congestion and cough   liothyronine 5 MCG tablet Commonly known as: CYTOMEL Take 5 mcg by mouth daily before breakfast.   mometasone 50 MCG/ACT nasal spray Commonly known as: Nasonex Place 2 sprays into the nose daily.   ondansetron 4 MG disintegrating tablet Commonly known as: ZOFRAN-ODT Take 1 tablet (4 mg total) by mouth every 8 (eight) hours as needed for nausea or vomiting.   Tirosint 150 MCG Caps Generic drug: Levothyroxine Sodium Take 150 mcg by mouth daily before breakfast.   triamcinolone cream 0.1 % Commonly known as: KENALOG Apply 1 application topically 2 (two) times daily. What changed:  when to take this reasons to take this   triamterene-hydrochlorothiazide 37.5-25 MG tablet Commonly known as: MAXZIDE-25 Take 1 tablet by mouth daily.   venlafaxine XR 37.5 MG 24 hr capsule Commonly known as:  EFFEXOR-XR TAKE 1 CAPSULE BY MOUTH DAILY WITH BREAKFAST.        Follow-up Information     Sharlene Dory, DO Follow up in 1 week(s).   Specialty: Family Medicine Contact information: [address] [place] [address] [phone]                Allergies  Allergen Reactions   Chlorhexidine Itching   Ciprofloxacin Anxiety, Other (See Comments), Rash and Swelling    GI upset   Prednisone Shortness Of Breath and Other (See Comments)    Jittery, "jacks me up"     Codeine Nausea Only   Cortisone Swelling and Other (See Comments)    "hyper sensitive, jacks me up"    Colchicine Nausea Only    You were cared for by a hospitalist during your hospital stay. If you have any questions about your discharge medications or the care you received while  you were in the hospital after you are discharged, you can call the unit and asked to speak with the hospitalist on call if the hospitalist that took care of you is not available. Once you are discharged, your primary care physician will handle any further medical issues. Please note that no refills for any discharge medications will be authorized once you are discharged, as it is imperative that you return to your primary care physician (or establish a relationship with a primary care physician if you do not have one) for your aftercare needs so that they can reassess your need for medications and monitor your lab values.  You were cared for by a hospitalist during your hospital stay. If you have any questions about your discharge medications or the care you received while you were in the hospital after you are discharged, you can call the unit and asked to speak with the hospitalist on call if the hospitalist that took care of you is not available. Once you are discharged, your primary care physician will handle any further medical issues. Please note that NO REFILLS for any discharge medications will be authorized  once you are discharged, as it is imperative that you return to your primary care physician (or establish a relationship with a primary care physician if you do not have one) for your aftercare needs so that they can reassess your need for medications and monitor your lab values.  Please request your Prim.MD to go over all Hospital Tests and Procedure/Radiological results at the follow up, please get all Hospital records sent to your Prim MD by signing hospital release before you go home.  Get CBC, CMP, 2 view Chest X ray checked  by Primary MD during your next visit or SNF MD in 5-7 days ( we routinely change or add medications that can affect your baseline labs and fluid status, therefore we recommend that you get the mentioned basic workup next visit with your PCP, your PCP may decide not to get them or add new tests based on their clinical decision)  On your next visit with your primary care physician please Get Medicines reviewed and adjusted.  If you experience worsening of your admission symptoms, develop shortness of breath, life threatening emergency, suicidal or homicidal thoughts you must seek medical attention immediately by calling 911 or calling your MD immediately  if symptoms less severe.  You Must read complete instructions/literature along with all the possible adverse reactions/side effects for all the Medicines you take and that have been prescribed to you. Take any new Medicines after you have completely understood and accpet all the possible adverse reactions/side effects.   Do not drive, operate heavy machinery, perform activities at heights, swimming or participation in water activities or provide baby sitting services if your were admitted for syncope or siezures until you have seen by Primary MD or a Neurologist and advised to do so again.  Do not drive when taking Pain medications.   Procedures/Studies: CT ABDOMEN PELVIS W CONTRAST  Result Date: 01/08/2023 CLINICAL  DATA:  Sepsis, abdominal pain. Body aches with back pain, chills and fever. Nausea and shortness of breath. Tachycardia. History of breast cancer. EXAM: CT ABDOMEN AND PELVIS WITH CONTRAST TECHNIQUE: Multidetector CT imaging of the abdomen and pelvis was performed using the standard protocol following bolus administration of intravenous contrast. RADIATION DOSE REDUCTION: This exam was performed according to the departmental dose-optimization program which includes automated exposure control, adjustment of the mA and/or kV according to patient  size and/or use of iterative reconstruction technique. CONTRAST:  OMNIPAQUE IOHEXOL 300 MG/ML  SOLN COMPARISON:  Abdominopelvic CT 03/12/2020, chest CT 04/03/2021 FINDINGS: Lower chest: Lung bases demonstrate no acute findings. 3 mm nodule over the right middle lobe unchanged from 2022. Hepatobiliary: Previous cholecystectomy. Liver and biliary tree are normal. Pancreas: Normal. Spleen: Normal. Adrenals/Urinary Tract: Adrenal glands are normal. Kidneys are normal in size. A few small bilateral subcentimeter renal cortical hypodensities too small to characterize, but likely cysts. These are unchanged as no further imaging follow-up is recommended. Ureters and bladder are normal. Stomach/Bowel: Stomach and small bowel are normal. Appendix is normal. Moderate diverticulosis of the mid to distal descending and sigmoid colon. There is mild inflammatory change adjacent a couple diverticula over the sigmoid colon compatible with acute diverticulitis. No evidence of perforation or diverticular abscess. Vascular/Lymphatic: Mild calcified plaque over the abdominal aorta which is normal in caliber. No adenopathy. Reproductive: Normal. Other: None. Musculoskeletal: No focal abnormality. IMPRESSION: 1. Evidence of mild acute diverticulitis of the sigmoid colon. No evidence of perforation or diverticular abscess. 2. Few small bilateral subcentimeter renal cortical hypodensities too  small to characterize, but likely cysts. These are unchanged as no further imaging follow-up is recommended. 3. Aortic atherosclerosis. 4. 3 mm nodule over the right middle lobe unchanged from 2022. No further follow-up recommended. Aortic Atherosclerosis (ICD10-I70.0). Electronically Signed   By: Elberta Fortis M.D.   On: 01/08/2023 15:07   DG Chest Portable 1 View  Result Date: 01/08/2023 CLINICAL DATA:  fatigue / febrile / tachycardia EXAM: PORTABLE CHEST - 1 VIEW COMPARISON:  09/24/2022 FINDINGS: Cardiac silhouette is unremarkable. No pneumothorax or pleural effusion. The lungs are clear. The visualized skeletal structures are unremarkable. IMPRESSION: No acute cardiopulmonary process. Electronically Signed   By: Layla Maw M.D.   On: 01/08/2023 13:44     The results of significant diagnostics from this hospitalization (including imaging, microbiology, ancillary and laboratory) are listed below for reference.     Microbiology: Recent Results (from the past 240 hour(s))  Urine Culture     Status: None   Collection Time: 01/07/23  3:10 PM   Specimen: Urine  Result Value Ref Range Status   MICRO NUMBER: [id number]  Final   SPECIMEN QUALITY: Adequate  Final   Sample Source NOT GIVEN  Final   STATUS: FINAL  Final   Result: No Growth  Final  Resp panel by RT-PCR (RSV, Flu A&B, Covid) Anterior Nasal Swab     Status: None   Collection Time: 01/08/23  1:06 PM   Specimen: Anterior Nasal Swab  Result Value Ref Range Status   SARS Coronavirus 2 by RT PCR NEGATIVE NEGATIVE Final    Comment: (NOTE) SARS-CoV-2 target nucleic acids are NOT DETECTED.  The SARS-CoV-2 RNA is generally detectable in upper respiratory specimens during the acute phase of infection. The lowest concentration of SARS-CoV-2 viral copies this assay can detect is 138 copies/mL. A negative result does not preclude SARS-Cov-2 infection and should not be used as the sole basis for treatment or other patient management  decisions. A negative result may occur with  improper specimen collection/handling, submission of specimen other than nasopharyngeal swab, presence of viral mutation(s) within the areas targeted by this assay, and inadequate number of viral copies(<138 copies/mL). A negative result must be combined with clinical observations, patient history, and epidemiological information. The expected result is Negative.  Fact Sheet for Patients:  BloggerCourse.com  Fact Sheet for Healthcare Providers:  SeriousBroker.it  This test is  no t yet approved or cleared by the [place] and  has been authorized for detection and/or diagnosis of SARS-CoV-2 by FDA under an Emergency Use Authorization (EUA). This EUA will remain  in effect (meaning this test can be used) for the duration of the COVID-19 declaration under Section 564(b)(1) of the Act, 21 U.S.C.section 360bbb-3(b)(1), unless the authorization is terminated  or revoked sooner.       Influenza A by PCR NEGATIVE NEGATIVE Final   Influenza B by PCR NEGATIVE NEGATIVE Final    Comment: (NOTE) The Xpert Xpress SARS-CoV-2/FLU/RSV plus assay is intended as an aid in the diagnosis of influenza from Nasopharyngeal swab specimens and should not be used as a sole basis for treatment. Nasal washings and aspirates are unacceptable for Xpert Xpress SARS-CoV-2/FLU/RSV testing.  Fact Sheet for Patients: BloggerCourse.com  Fact Sheet for Healthcare Providers: SeriousBroker.it  This test is not yet approved or cleared by the [place] FDA and has been authorized for detection and/or diagnosis of SARS-CoV-2 by FDA under an Emergency Use Authorization (EUA). This EUA will remain in effect (meaning this test can be used) for the duration of the COVID-19 declaration under Section 564(b)(1) of the Act, 21 U.S.C. section 360bbb-3(b)(1), unless the  authorization is terminated or revoked.     Resp Syncytial Virus by PCR NEGATIVE NEGATIVE Final    Comment: (NOTE) Fact Sheet for Patients: BloggerCourse.com  Fact Sheet for Healthcare Providers: SeriousBroker.it  This test is not yet approved or cleared by the [place] FDA and has been authorized for detection and/or diagnosis of SARS-CoV-2 by FDA under an Emergency Use Authorization (EUA). This EUA will remain in effect (meaning this test can be used) for the duration of the COVID-19 declaration under Section 564(b)(1) of the Act, 21 U.S.C. section 360bbb-3(b)(1), unless the authorization is terminated or revoked.  Performed at [facility], [address] [place], [place] [address]   Blood culture (routine x 2)     Status: None (Preliminary result)   Collection Time: 01/08/23  1:18 PM   Specimen: BLOOD  Result Value Ref Range Status   Specimen Description   Final    BLOOD RIGHT ANTECUBITAL Performed at [facility], [address] [place], [place] [address]    Special Requests   Final    Blood Culture adequate volume BOTTLES DRAWN AEROBIC AND ANAEROBIC Performed at [facility], [address] [place], [place] [address]    Culture   Final    NO GROWTH 3 DAYS Performed at [facility], [address], [place], [place] [address]    Report Status PENDING  Incomplete  Blood culture (routine x 2)     Status: None (Preliminary result)   Collection Time: 01/08/23  1:18 PM   Specimen: BLOOD RIGHT FOREARM  Result Value Ref Range Status   Specimen Description   Final    BLOOD RIGHT FOREARM Performed at [facility], [address] [place], [place] [address]    Special Requests   Final    Blood Culture adequate volume BOTTLES DRAWN AEROBIC AND ANAEROBIC Performed at [facility], [address] [place], [place] [address]    Culture   Final    NO GROWTH 3  DAYS Performed at [facility], [address], [place], [place] [address]    Report Status PENDING  Incomplete  Culture, blood (single) w Reflex to ID Panel  Status: None (Preliminary result)   Collection Time: 01/08/23 11:42 PM   Specimen: BLOOD  Result Value Ref Range Status   Specimen Description   Final    BLOOD SITE NOT SPECIFIED Performed at [facility], [address], [place], [place] [address]    Special Requests   Final    BOTTLES DRAWN AEROBIC ONLY Blood Culture adequate volume Performed at [facility], [address] [place], [place] [address]    Culture   Final    NO GROWTH 2 DAYS Performed at [facility], [address], [place], [place] [address]    Report Status PENDING  Incomplete     Labs: BNP (last 3 results) No results for input(s): "BNP" in the last 8760 hours. Basic Metabolic Panel: Recent Labs  Lab 01/08/23 1318 01/08/23 2121 01/09/23 0527 01/10/23 0559 01/11/23 0523  NA 132*  --  134* 136 139  K 3.3*  --  3.2* 3.3* 4.0  CL 101  --  102 105 107  CO2 21*  --  25 22 23   GLUCOSE 140*  --  115* 105* 97  BUN 14  --  11 8 7*  CREATININE 0.86 0.79 0.69 0.56 0.62  CALCIUM 8.8*  --  8.5* 8.4* 8.7*  MG  --   --   --  2.0 2.0   Liver Function Tests: Recent Labs  Lab 01/08/23 1318 01/09/23 0527  AST 81* 38  ALT 65* 47*  ALKPHOS 94 73  BILITOT 1.8* 2.7*  PROT 7.4 6.0*  ALBUMIN 3.7 3.1*   No results for input(s): "LIPASE", "AMYLASE" in the last 168 hours. No results for input(s): "AMMONIA" in the last 168 hours. CBC: Recent Labs  Lab 01/08/23 1318 01/08/23 2121 01/09/23 0527 01/10/23 0559 01/11/23 0523  WBC 15.6* 9.7 7.5 3.8* 3.2*  HGB 12.8 11.5* 10.5* 9.8* 10.2*  HCT 37.4 34.7* 31.5* 30.4* 31.8*  MCV 89.5 91.3 92.4 94.4 94.6  PLT 258 218 204 209 244   Cardiac Enzymes: No results for input(s): "CKTOTAL", "CKMB", "CKMBINDEX", "TROPONINI" in the last 168 hours. BNP: Invalid  input(s): "POCBNP" CBG: No results for input(s): "GLUCAP" in the last 168 hours. D-Dimer No results for input(s): "DDIMER" in the last 72 hours. Hgb A1c No results for input(s): "HGBA1C" in the last 72 hours. Lipid Profile No results for input(s): "CHOL", "HDL", "LDLCALC", "TRIG", "CHOLHDL", "LDLDIRECT" in the last 72 hours. Thyroid function studies Recent Labs    01/08/23 2121  TSH 0.012*   Anemia work up No results for input(s): "VITAMINB12", "FOLATE", "FERRITIN", "TIBC", "IRON", "RETICCTPCT" in the last 72 hours. Urinalysis    Component Value Date/Time   COLORURINE YELLOW 01/08/2023 1500   APPEARANCEUR CLEAR 01/08/2023 1500   LABSPEC 1.010 01/08/2023 1500   LABSPEC 1.020 06/15/2014 1101   PHURINE 7.0 01/08/2023 1500   GLUCOSEU NEGATIVE 01/08/2023 1500   GLUCOSEU Negative 06/15/2014 1101   HGBUR NEGATIVE 01/08/2023 1500   BILIRUBINUR NEGATIVE 01/08/2023 1500   BILIRUBINUR negative 01/07/2023 1436   BILIRUBINUR Color Interference 06/15/2014 1101   KETONESUR NEGATIVE 01/08/2023 1500   PROTEINUR NEGATIVE 01/08/2023 1500   UROBILINOGEN 0.2 01/07/2023 1436   UROBILINOGEN 0.2 06/15/2014 1101   NITRITE NEGATIVE 01/08/2023 1500   LEUKOCYTESUR NEGATIVE 01/08/2023 1500   LEUKOCYTESUR Trace 06/15/2014 1101   Sepsis Labs Recent Labs  Lab 01/08/23 2121 01/09/23 0527 01/10/23 0559 01/11/23 0523  WBC 9.7 7.5 3.8* 3.2*   Microbiology Recent Results (from the past 240 hour(s))  Urine  Culture     Status: None   Collection Time: 01/07/23  3:10 PM   Specimen: Urine  Result Value Ref Range Status   MICRO NUMBER: [id number]  Final   SPECIMEN QUALITY: Adequate  Final   Sample Source NOT GIVEN  Final   STATUS: FINAL  Final   Result: No Growth  Final  Resp panel by RT-PCR (RSV, Flu A&B, Covid) Anterior Nasal Swab     Status: None   Collection Time: 01/08/23  1:06 PM   Specimen: Anterior Nasal Swab  Result Value Ref Range Status   SARS Coronavirus 2 by RT PCR NEGATIVE NEGATIVE  Final    Comment: (NOTE) SARS-CoV-2 target nucleic acids are NOT DETECTED.  The SARS-CoV-2 RNA is generally detectable in upper respiratory specimens during the acute phase of infection. The lowest concentration of SARS-CoV-2 viral copies this assay can detect is 138 copies/mL. A negative result does not preclude SARS-Cov-2 infection and should not be used as the sole basis for treatment or other patient management decisions. A negative result may occur with  improper specimen collection/handling, submission of specimen other than nasopharyngeal swab, presence of viral mutation(s) within the areas targeted by this assay, and inadequate number of viral copies(<138 copies/mL). A negative result must be combined with clinical observations, patient history, and epidemiological information. The expected result is Negative.  Fact Sheet for Patients:  BloggerCourse.com  Fact Sheet for Healthcare Providers:  SeriousBroker.it  This test is no t yet approved or cleared by the [place] FDA and  has been authorized for detection and/or diagnosis of SARS-CoV-2 by FDA under an Emergency Use Authorization (EUA). This EUA will remain  in effect (meaning this test can be used) for the duration of the COVID-19 declaration under Section 564(b)(1) of the Act, 21 U.S.C.section 360bbb-3(b)(1), unless the authorization is terminated  or revoked sooner.       Influenza A by PCR NEGATIVE NEGATIVE Final   Influenza B by PCR NEGATIVE NEGATIVE Final    Comment: (NOTE) The Xpert Xpress SARS-CoV-2/FLU/RSV plus assay is intended as an aid in the diagnosis of influenza from Nasopharyngeal swab specimens and should not be used as a sole basis for treatment. Nasal washings and aspirates are unacceptable for Xpert Xpress SARS-CoV-2/FLU/RSV testing.  Fact Sheet for Patients: BloggerCourse.com  Fact Sheet for Healthcare  Providers: SeriousBroker.it  This test is not yet approved or cleared by the [place] FDA and has been authorized for detection and/or diagnosis of SARS-CoV-2 by FDA under an Emergency Use Authorization (EUA). This EUA will remain in effect (meaning this test can be used) for the duration of the COVID-19 declaration under Section 564(b)(1) of the Act, 21 U.S.C. section 360bbb-3(b)(1), unless the authorization is terminated or revoked.     Resp Syncytial Virus by PCR NEGATIVE NEGATIVE Final    Comment: (NOTE) Fact Sheet for Patients: BloggerCourse.com  Fact Sheet for Healthcare Providers: SeriousBroker.it  This test is not yet approved or cleared by the [place] FDA and has been authorized for detection and/or diagnosis of SARS-CoV-2 by FDA under an Emergency Use Authorization (EUA). This EUA will remain in effect (meaning this test can be used) for the duration of the COVID-19 declaration under Section 564(b)(1) of the Act, 21 U.S.C. section 360bbb-3(b)(1), unless the authorization is terminated or revoked.  Performed at [facility], [address] [place], [place] [address]   Blood culture (routine x 2)     Status: None (Preliminary result)   Collection Time:  01/08/23  1:18 PM   Specimen: BLOOD  Result Value Ref Range Status   Specimen Description   Final    BLOOD RIGHT ANTECUBITAL Performed at [facility], [address] [place], [place] [address]    Special Requests   Final    Blood Culture adequate volume BOTTLES DRAWN AEROBIC AND ANAEROBIC Performed at [facility], [address] [place], [place] [address]    Culture   Final    NO GROWTH 3 DAYS Performed at [facility], [address] [place], [place] [address]    Report Status PENDING  Incomplete  Blood culture (routine x 2)     Status: None (Preliminary result)   Collection Time:  01/08/23  1:18 PM   Specimen: BLOOD RIGHT FOREARM  Result Value Ref Range Status   Specimen Description   Final    BLOOD RIGHT FOREARM Performed at [facility], [address] [place], [place] [address]    Special Requests   Final    Blood Culture adequate volume BOTTLES DRAWN AEROBIC AND ANAEROBIC Performed at [facility], [address] [place], [place] [address]    Culture   Final    NO GROWTH 3 DAYS Performed at [facility], [address], [place], [place] [address]    Report Status PENDING  Incomplete  Culture, blood (single) w Reflex to ID Panel     Status: None (Preliminary result)   Collection Time: 01/08/23 11:42 PM   Specimen: BLOOD  Result Value Ref Range Status   Specimen Description   Final    BLOOD SITE NOT SPECIFIED Performed at [facility], [address], [place], [place] [address]    Special Requests   Final    BOTTLES DRAWN AEROBIC ONLY Blood Culture adequate volume Performed at [facility], [address] [place], [place] [address]    Culture   Final    NO GROWTH 2 DAYS Performed at [facility], [address], [place], [place] [address]    Report Status PENDING  Incomplete     Time coordinating discharge:  I have spent 35 minutes face to face with the patient and on the ward discussing the patients care, assessment, plan and disposition with other care givers. >50% of the time was devoted counseling the patient about the risks and benefits of treatment/Discharge disposition and coordinating care.   SIGNED:   Dimple Nanas, MD  [facility] 01/11/2023, 11:50 AM   If 7PM-7AM, please contact night-coverage

## 2023-01-12 LAB — CULTURE, BLOOD (SINGLE): Special Requests: ADEQUATE

## 2023-01-12 LAB — CULTURE, BLOOD (ROUTINE X 2): Culture: NO GROWTH

## 2023-01-13 ENCOUNTER — Telehealth: Payer: Self-pay | Admitting: *Deleted

## 2023-01-13 ENCOUNTER — Encounter: Payer: Self-pay | Admitting: *Deleted

## 2023-01-13 LAB — CULTURE, BLOOD (SINGLE): Culture: NO GROWTH

## 2023-01-13 LAB — CULTURE, BLOOD (ROUTINE X 2)

## 2023-01-13 NOTE — Transitions of Care (Post Inpatient/ED Visit) (Signed)
01/13/2023  Name: Alexandria Bradley MRN: [id number] DOB: 09/08/1961  Today's TOC FU Call Status: Today's TOC FU Call Status:: Successful TOC FU Call Competed TOC FU Call Complete Date: 01/13/23  Transition Care Management Follow-up Telephone Call Date of Discharge: 01/11/23 Discharge Facility: [facility]) Type of Discharge: Inpatient Admission Primary Inpatient Discharge Diagnosis:: sepsis secondary to diverticulitis How have you been since you were released from the hospital?: Better ("I am doing fine; my stomach is a little queasy and uncomfortable right now, but they told me to expect that, so I guess it's nothing to be worried about.  I will ask about if I should be taking probiotics-- I have been on a lot of antibiotics lately") Any questions or concerns?: No  Items Reviewed: Did you receive and understand the discharge instructions provided?: Yes (thoroughly reviewed with patient who verbalizes good understanding of same) Medications obtained and verified?: Yes (Medications Reviewed) (Full medication review completed; no concerns or discrepancies identified; confirmed patient obtained/ is taking all newly Rx'd medications as instructed; self-manages medications and denies questions/ concerns around medications today) Any new allergies since your discharge?: No Dietary orders reviewed?: Yes Type of Diet Ordered:: low residue; conservative/ bland-- progress as tolerated Do you have support at home?: Yes People in Home: spouse Name of Support/Comfort Primary Source: Reports independent in self-care activities; supportive spouse assists as/ if needed/ indicated  Home Care and Equipment/Supplies: Were Home Health Services Ordered?: No Any new equipment or medical supplies ordered?: No  Functional Questionnaire: Do you need assistance with bathing/showering or dressing?: No Do you need assistance with meal preparation?: No Do you need assistance with eating?: No Do you have  difficulty maintaining continence: No Do you need assistance with getting out of bed/getting out of a chair/moving?: No Do you have difficulty managing or taking your medications?: No  Follow up appointments reviewed: PCP Follow-up appointment confirmed?: Yes Date of PCP follow-up appointment?: 01/17/23 Follow-up Provider: PCP Specialist Hospital Follow-up appointment confirmed?: NA Do you need transportation to your follow-up appointment?: No Do you understand care options if your condition(s) worsen?: Yes-patient verbalized understanding  SDOH Interventions Today    Flowsheet Row Most Recent Value  SDOH Interventions   Food Insecurity Interventions Intervention Not Indicated  Transportation Interventions Intervention Not Indicated  [drives self,  spouse assists as needed/ indicated]      TOC Interventions Today    Flowsheet Row Most Recent Value  TOC Interventions   TOC Interventions Discussed/Reviewed TOC Interventions Discussed  [Patient declines need for ongoing/ further care coordination outreach,  no care coordination needs identified at time of TOC call today,  provided my direct contact information should questions/ concerns/ needs arise post-TOC call]      Interventions Today    Flowsheet Row Most Recent Value  Chronic Disease   Chronic disease during today's visit Other  [sepsis secondary to diverticulitis]  General Interventions   General Interventions Discussed/Reviewed General Interventions Discussed, Doctor Visits  Doctor Visits Discussed/Reviewed PCP, Doctor Visits Discussed  PCP/Specialist Visits Compliance with follow-up visit  Education Interventions   Education Provided Provided Education  Provided Verbal Education On Nutrition, Other, When to see the doctor, Medication  [reinrorced diet/ diet progression instructions,  potential benefits of probiotics in setting of prolonged antibiotic therapy]  Nutrition Interventions   Nutrition Discussed/Reviewed  Nutrition Discussed  Pharmacy Interventions   Pharmacy Dicussed/Reviewed Pharmacy Topics Discussed  [Full medication review with updating medication list in EHR per patient report]      Cherie Dark  Franchot Gallo, RN, BSN, CCRN Alumnus RN CM Care Coordination/ Transition of [place] Management [phone]: direct office

## 2023-01-14 LAB — CULTURE, BLOOD (SINGLE)

## 2023-01-17 ENCOUNTER — Encounter: Payer: Self-pay | Admitting: Family Medicine

## 2023-01-17 ENCOUNTER — Ambulatory Visit (INDEPENDENT_AMBULATORY_CARE_PROVIDER_SITE_OTHER): Payer: BC Managed Care – PPO | Admitting: Family Medicine

## 2023-01-17 VITALS — BP 128/74 | HR 66 | Temp 97.9°F | Resp 16 | Ht 65.0 in | Wt 167.2 lb

## 2023-01-17 DIAGNOSIS — R590 Localized enlarged lymph nodes: Secondary | ICD-10-CM

## 2023-01-17 DIAGNOSIS — K5792 Diverticulitis of intestine, part unspecified, without perforation or abscess without bleeding: Secondary | ICD-10-CM | POA: Diagnosis not present

## 2023-01-17 DIAGNOSIS — E038 Other specified hypothyroidism: Secondary | ICD-10-CM

## 2023-01-17 LAB — COMPREHENSIVE METABOLIC PANEL
ALT: 32 U/L (ref 0–35)
AST: 31 U/L (ref 0–37)
Albumin: 4.7 g/dL (ref 3.5–5.2)
Alkaline Phosphatase: 106 U/L (ref 39–117)
BUN: 13 mg/dL (ref 6–23)
CO2: 22 mEq/L (ref 19–32)
Calcium: 10.3 mg/dL (ref 8.4–10.5)
Chloride: 101 mEq/L (ref 96–112)
Creatinine, Ser: 0.75 mg/dL (ref 0.40–1.20)
GFR: 84.35 mL/min (ref >60.00)
Glucose, Bld: 100 mg/dL — ABNORMAL HIGH (ref 70–99)
Potassium: 3.7 mEq/L (ref 3.5–5.1)
Sodium: 136 mEq/L (ref 135–145)
Total Bilirubin: 0.6 mg/dL (ref 0.2–1.2)
Total Protein: 7.6 g/dL (ref 6.0–8.3)

## 2023-01-17 LAB — CBC
HCT: 40.8 % (ref 36.0–46.0)
Hemoglobin: 13.7 g/dL (ref 12.0–15.0)
MCHC: 33.7 g/dL (ref 30.0–36.0)
MCV: 92.6 fl (ref 78.0–100.0)
Platelets: 653 10*3/uL — ABNORMAL HIGH (ref 150.0–400.0)
RBC: 4.4 Mil/uL (ref 3.87–5.11)
RDW: 14.3 % (ref 11.5–15.5)
WBC: 10.4 10*3/uL (ref 4.0–10.5)

## 2023-01-17 LAB — T4, FREE: Free T4: 0.7 ng/dL (ref 0.60–1.60)

## 2023-01-17 LAB — TSH: TSH: 5.38 u[IU]/mL (ref 0.35–5.50)

## 2023-01-17 NOTE — Patient Instructions (Signed)
Give [place] 2-3 business days to get the results of your labs back.   Go on a probiotic for the next week.   You will likely need to resume previous blood pressure medication once you start eating normally.  Let us know if you need anything.

## 2023-01-17 NOTE — Progress Notes (Signed)
Chief Complaint  Patient presents with   Follow-up     Hospital follow up    Subjective: Patient is a 62 y.o. female here for hosp f/u.  The patient was admitted to [facility] on 4/10, discharged on 4/13 for sepsis secondary to diverticulitis.  She is feeling much better since that hospitalization.  Slight nausea, diet is slowly returning.  Pain is significantly improved.  No bleeding, vomiting, fevers.  She has been compliant with her Augmentin, last dose is tomorrow.  She is wondering if she should take a probiotic. Wondering when her bruising from SQ HPN will go away.  Thyroid levels were elevated while sick in the hospital.  She is compliant with her medicine.  Past Medical History:  Diagnosis Date   Anxiety    Arthritis    Back pain    Breast cancer    left   Fatty liver    GERD (gastroesophageal reflux disease)    Headache(784.0)    PMH : migraines   History of kidney stones    Hypercholesterolemia    Hypertension    Hypothyroidism    Osteoporosis    Personal history of chemotherapy    Personal history of radiation therapy    PONV (postoperative nausea and vomiting)    difficult to wake up   Pre-diabetes    Psoriasis    PVC's (premature ventricular contractions)    Radiation 08/18/14-10/07/14   Left breast   Sleep apnea    no CPAP    Objective: BP 128/74 (BP Location: Right Arm, Patient Position: Sitting, Cuff Size: Normal)   Pulse 66   Temp 97.9 F (36.6 C) (Oral)   Resp 16   Ht  (1.651 m)   Wt 167 lb 3.2 oz (75.8 kg)   SpO2 95%   BMI 27.82 kg/m  General: Awake, appears stated age Heart: RRR, no LE edema Lungs: CTAB, no rales, wheezes or rhonchi. No accessory muscle use Psych: Age appropriate judgment and insight, normal affect and mood  Assessment and Plan: Diverticulitis - Plan: CBC, Comprehensive metabolic panel  Other specified hypothyroidism - Plan:  T4, free, TSH  Clinically improving.  Use a probiotic for the next week as she  finishes the Augmentin.  Follow-up on labs today.  Expected bruising from DVT prophylaxis for the next month or so. Continue current thyroid regimen, probably was euthyroid sick syndrome. Follow-up as originally scheduled. The patient voiced understanding and agreement to the plan.  I spent 30 minutes with the patient discussing the above plans in addition to reviewing her chart on the same day of the visit.  Jilda Roche [facility], DO 01/17/23  12:08 PM

## 2023-01-20 ENCOUNTER — Ambulatory Visit: Payer: Medicare Other | Admitting: Family Medicine

## 2023-01-20 ENCOUNTER — Other Ambulatory Visit: Payer: Self-pay | Admitting: Family Medicine

## 2023-01-20 DIAGNOSIS — R7989 Other specified abnormal findings of blood chemistry: Secondary | ICD-10-CM

## 2023-01-24 ENCOUNTER — Other Ambulatory Visit (INDEPENDENT_AMBULATORY_CARE_PROVIDER_SITE_OTHER): Payer: BC Managed Care – PPO

## 2023-01-24 DIAGNOSIS — R7989 Other specified abnormal findings of blood chemistry: Secondary | ICD-10-CM | POA: Diagnosis not present

## 2023-01-24 LAB — CBC
HCT: 36.4 % (ref 36.0–46.0)
Hemoglobin: 12.4 g/dL (ref 12.0–15.0)
MCHC: 34 g/dL (ref 30.0–36.0)
MCV: 92 fl (ref 78.0–100.0)
Platelets: 405 10*3/uL — ABNORMAL HIGH (ref 150.0–400.0)
RBC: 3.96 Mil/uL (ref 3.87–5.11)
RDW: 14.3 % (ref 11.5–15.5)
WBC: 7.5 10*3/uL (ref 4.0–10.5)

## 2023-01-27 ENCOUNTER — Ambulatory Visit: Payer: Medicare Other | Admitting: Podiatry

## 2023-02-03 ENCOUNTER — Encounter: Payer: Self-pay | Admitting: Podiatry

## 2023-02-03 ENCOUNTER — Ambulatory Visit (INDEPENDENT_AMBULATORY_CARE_PROVIDER_SITE_OTHER): Payer: BC Managed Care – PPO | Admitting: Podiatry

## 2023-02-03 DIAGNOSIS — M7741 Metatarsalgia, right foot: Secondary | ICD-10-CM | POA: Diagnosis not present

## 2023-02-03 DIAGNOSIS — Q828 Other specified congenital malformations of skin: Secondary | ICD-10-CM

## 2023-02-03 DIAGNOSIS — T451X5A Adverse effect of antineoplastic and immunosuppressive drugs, initial encounter: Secondary | ICD-10-CM | POA: Diagnosis not present

## 2023-02-03 DIAGNOSIS — G620 Drug-induced polyneuropathy: Secondary | ICD-10-CM | POA: Diagnosis not present

## 2023-02-03 DIAGNOSIS — M7742 Metatarsalgia, left foot: Secondary | ICD-10-CM

## 2023-02-03 NOTE — Progress Notes (Signed)
  Subjective:  Patient ID: Alexandria Bradley, female    DOB: 12/22/1960,   MRN: [id number]  Chief Complaint  Patient presents with   Callouses    Bilateral callouses    62 y.o. female presents for concern of bilateral calluses. Relates she has been on chemotherapy and has caused neuropathy in her feet.  . Denies any other pedal complaints. Denies n/v/f/c.   Past Medical History:  Diagnosis Date   Anxiety    Arthritis    Back pain    Breast cancer (HCC)    left   Fatty liver    GERD (gastroesophageal reflux disease)    Headache(784.0)    PMH : migraines   History of kidney stones    Hypercholesterolemia    Hypertension    Hypothyroidism    Osteoporosis    Personal history of chemotherapy    Personal history of radiation therapy    PONV (postoperative nausea and vomiting)    difficult to wake up   Pre-diabetes    Psoriasis    PVC's (premature ventricular contractions)    Radiation 08/18/14-10/07/14   Left breast   Sleep apnea    no CPAP    Objective:  Physical Exam: Vascular: DP/PT pulses 2/4 bilateral. CFT <3 seconds. Normal hair growth on digits. No edema.  Skin. No lacerations or abrasions bilateral feet. Multiple cored hyperkeratotic lesions noted to bilateral plantar feet two specifically painful plantar fifth metatarsal heads bilateral.  Musculoskeletal: MMT 5/5 bilateral lower extremities in DF, PF, Inversion and Eversion. Deceased ROM in DF of ankle joint.  Neurological: Sensation intact to light touch. Protective sensation slightly diminished.   Assessment:   1. 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 Metatarsalgia of both feet      Plan:  Patient was evaluated and treated and all questions answered. -Discussed metatarsalgia and  corns and calluses with patient and treatment options.  -Hyperkeratotic tissue was debrided with chisel without incident as courtesy.  -Discussed salicylic acid treatments.  -Encouraged daily  moisturizing -Discussed use of pumice stone -Advised good supportive shoes and inserts -Padding provided to offload metatrsals.  -Patient to return to office as needed or sooner if condition worsens.   Louann Sjogren, DPM

## 2023-02-28 ENCOUNTER — Other Ambulatory Visit: Payer: Medicare Other

## 2023-03-12 ENCOUNTER — Encounter: Payer: Self-pay | Admitting: Gastroenterology

## 2023-03-12 ENCOUNTER — Ambulatory Visit (INDEPENDENT_AMBULATORY_CARE_PROVIDER_SITE_OTHER): Payer: BC Managed Care – PPO | Admitting: Gastroenterology

## 2023-03-12 VITALS — BP 138/68 | HR 62 | Ht 65.0 in | Wt 170.4 lb

## 2023-03-12 DIAGNOSIS — K52832 Lymphocytic colitis: Secondary | ICD-10-CM | POA: Diagnosis not present

## 2023-03-12 DIAGNOSIS — K5792 Diverticulitis of intestine, part unspecified, without perforation or abscess without bleeding: Secondary | ICD-10-CM

## 2023-03-12 MED ORDER — DICYCLOMINE HCL 10 MG PO CAPS: 10.0000 mg | ORAL_CAPSULE | Freq: Three times a day (TID) | ORAL | 11 refills | Status: DC

## 2023-03-12 NOTE — Patient Instructions (Signed)
If your blood pressure at your visit was 140/90 or greater, please contact your primary care physician to follow up on this. ______________________________________________________  If you are age 64 or older, your body mass index should be between 23-30. Your Body mass index is 28.36 kg/m. If this is out of the aforementioned range listed, please consider follow up with your Primary Care Provider.  If you are age 21 or younger, your body mass index should be between 19-25. Your Body mass index is 28.36 kg/m. If this is out of the aformentioned range listed, please consider follow up with your Primary Care Provider.  ________________________________________________________  The [place] [facility] providers would like to encourage you to use [facility] to communicate with providers for non-urgent requests or questions.  Due to long hold times on the telephone, sending your provider a message by [facility] may be a faster and more efficient way to get a response.  Please allow 48 business hours for a response.  Please remember that this is for non-urgent requests.  _______________________________________________________  Due to recent changes in healthcare laws, you may see the results of your imaging and laboratory studies on MyChart before your provider has had a chance to review them.  We understand that in some cases there may be results that are confusing or concerning to you. Not all laboratory results come back in the same time frame and the provider may be waiting for multiple results in order to interpret others.  Please give [place] 48 hours in order for your provider to thoroughly review all the results before contacting the office for clarification of your results.   We have sent the following medications to your pharmacy for you to pick up at your convenience: Bentyl 10 mg: Take three times a day before meals.  Please call in a few weeks if you are not feeling better.  Thank you for choosing me and  [facility].  Venita Lick. Pleas Koch., MD., Clementeen Graham

## 2023-03-12 NOTE — Progress Notes (Signed)
    Assessment     Diverticulitis with sepsis, resolved Postprandial loose, urgent stools. 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 Colonoscopy performed 3 years ago therefore no plans to repeat colonoscopy at this time Surveillance colonoscopy recommended in June 2028   HPI    This is a 62 year old female recently hospitalized for diverticulitis with sepsis.  Her abdominal pain has resolved.  She has noted looser stools that are urgent following meals since discharge.  CT AP 01/08/2023 1. Evidence of mild acute diverticulitis of the sigmoid colon. No evidence of perforation or diverticular abscess. 2. Few small bilateral subcentimeter renal cortical hypodensities too small to characterize, but likely cysts. These are unchanged as no further imaging follow-up is recommended. 3. Aortic atherosclerosis. 4. 3 mm nodule over the right middle lobe unchanged from 2022. No further follow-up recommended. 5. Aortic Atherosclerosis    Colonoscopy June 2021 - The examined portion of the ileum was normal.  - A single non-bleeding colonic angiodysplastic lesion.  - Mild diverticulosis in the left colon.  - Patchy mild erythema was seen in sigmoid colon and rectum. Biopsied.  - The examination was otherwise normal on direct and retroflexion views. Random biopsies obtained.  Path: lymphocytic colitis    Labs / Imaging       Latest Ref Rng & Units 01/17/2023   11:56 AM 01/09/2023    5:27 AM 01/08/2023    1:18 PM  Hepatic Function  Total Protein 6.0 - 8.3 g/dL 7.6  6.0  7.4   Albumin 3.5 - 5.2 g/dL 4.7  3.1  3.7   AST 0 - 37 U/L 31  38  81   ALT 0 - 35 U/L  32  47  65   Alk Phosphatase 39 - 117 U/L 106  73  94   Total Bilirubin 0.2 - 1.2 mg/dL 0.6  2.7  1.8        Latest Ref Rng & Units 01/24/2023   10:51 AM 01/17/2023   11:56 AM 01/11/2023    5:23 AM  CBC  WBC 4.0 - 10.5 K/uL 7.5  10.4  3.2   Hemoglobin 12.0 - 15.0 g/dL 16.1  09.6  04.5   Hematocrit 36.0 - 46.0 % 36.4  40.8  31.8   Platelets 150.0 - 400.0 K/uL 405.0  653.0  244     Current Medications, Allergies, Past Medical History, Past Surgical History, Family History and Social History were reviewed in [employer] record.   Physical Exam: General: Well developed, well nourished, no acute distress Head: Normocephalic and atraumatic Eyes: Sclerae anicteric, EOMI Ears: Normal auditory acuity Mouth: No deformities or lesions noted Lungs: Clear throughout to auscultation Heart: Regular rate and rhythm; No murmurs, rubs or bruits Abdomen: Soft, non tender and non distended. No masses, hepatosplenomegaly or hernias noted. Normal Bowel sounds Rectal: Musculoskeletal: Symmetrical with no gross deformities  Pulses:  Normal pulses noted Extremities: No edema or deformities noted Neurological: Alert oriented x 4, grossly nonfocal Psychological:  Alert and cooperative. Normal mood and affect   Nhat Hearne T. Russella Dar, MD 03/12/2023, 11:02 AM

## 2023-04-04 ENCOUNTER — Other Ambulatory Visit: Payer: Self-pay | Admitting: Family Medicine

## 2023-04-15 ENCOUNTER — Other Ambulatory Visit: Payer: Self-pay | Admitting: Family Medicine

## 2023-04-16 ENCOUNTER — Encounter: Payer: Self-pay | Admitting: Family Medicine

## 2023-04-16 ENCOUNTER — Ambulatory Visit (INDEPENDENT_AMBULATORY_CARE_PROVIDER_SITE_OTHER): Payer: BC Managed Care – PPO | Admitting: Family Medicine

## 2023-04-16 ENCOUNTER — Telehealth: Payer: Self-pay | Admitting: Family Medicine

## 2023-04-16 VITALS — BP 120/82 | HR 73 | Temp 98.0°F | Ht 65.0 in | Wt 168.4 lb

## 2023-04-16 DIAGNOSIS — M5442 Lumbago with sciatica, left side: Secondary | ICD-10-CM

## 2023-04-16 MED ORDER — KETOROLAC TROMETHAMINE 60 MG/2ML IM SOLN
60.0000 mg | Freq: Once | INTRAMUSCULAR | Status: AC
Start: 2023-04-16 — End: 2023-04-16
  Administered 2023-04-16: 60 mg via INTRAMUSCULAR

## 2023-04-16 NOTE — Progress Notes (Signed)
Musculoskeletal Exam  Patient: Alexandria Bradley DOB: [birth year]  DOS: 04/16/2023  SUBJECTIVE:  Chief Complaint:   Chief Complaint  Patient presents with   Sciatica    left    Alexandria Bradley is a 62 y.o.  female for evaluation and treatment of back pain.   Onset:  6 weeks ago. No inj or change in activity.   Location: lower L back Character:  aching  Progression of issue:  is unchanged Associated symptoms: no bruising, redness, swelling Radiates down LLE Denies bowel/bladder incontinence or weakness Treatment: to date has been OTC NSAIDS, heat.   Neurovascular symptoms: no  Past Medical History:  Diagnosis Date   Anxiety    Arthritis    Back pain    Breast cancer (HCC)    left   Fatty liver    GERD (gastroesophageal reflux disease)    Gout    Headache(784.0)    PMH : migraines   History of kidney stones    Hypercholesterolemia    Hypertension    Hypothyroidism    Osteoporosis    Personal history of chemotherapy    Personal history of radiation therapy    PONV (postoperative nausea and vomiting)    difficult to wake up   Pre-diabetes    Psoriasis    PVC's (premature ventricular contractions)    Radiation 08/18/14-10/07/14   Left breast   Sleep apnea    no CPAP    Objective:  VITAL SIGNS: BP 120/82 (BP Location: Left Arm, Patient Position: Sitting, Cuff Size: Normal)   Pulse 73   Temp 98 F (36.7 C) (Oral)   Ht 5\' 5"  (1.651 m)   Wt 168 lb 6 oz (76.4 kg)   SpO2 95%   BMI 28.02 kg/m  Constitutional: Well formed, well developed. No acute distress. HENT: Normocephalic, atraumatic.  Thorax & Lungs:  No accessory muscle use Musculoskeletal: low back.   Tenderness to palpation: yes- over L lumbar parasp msc and SI jt, greater troch on L Deformity: no Ecchymosis: no Straight leg test: negative for Poor hamstring flexibility b/l, worse on L. Neurologic: Normal sensory function. No focal deficits noted. DTR's equal and symmetric in LE's. No  clonus. Psychiatric: Normal mood. Age appropriate judgment and insight. Alert & oriented x 3.    Assessment:  Acute left-sided low back pain with left-sided sciatica  Plan: Stretches/exercises, heat, ice, Tylenol, NSAIDs. Toradol injection today. Could repeat prior to [facility] trip in 2 weeks F/u prn. The patient voiced understanding and agreement to the plan.   Jilda Roche [place], DO 04/16/23  4:29 PM

## 2023-04-16 NOTE — Telephone Encounter (Signed)
Scheduled today

## 2023-04-16 NOTE — Telephone Encounter (Signed)
Patient called and is experiencing  pain in back and leg she would like to know can she be put back on the  shot for anti inflammatory.

## 2023-04-16 NOTE — Addendum Note (Signed)
Addended by: Rosita Kea on: 04/16/2023 04:43 PM   Modules accepted: Orders

## 2023-04-16 NOTE — Patient Instructions (Signed)
Heat (pad or rice pillow in microwave) over affected area, 10-15 minutes twice daily.   Ice/cold pack over area for 10-15 min twice daily.  OK to take Tylenol 1000 mg (2 extra strength tabs) or 975 mg (3 regular strength tabs) every 6 hours as needed.  Go back to the stretches for your back.  Send me a message if you wish to see the vestibular rehab team.   Stay hydrated.  Consider wearing compression stockings to help with the lightheadedness.   Let us know if you need anything.

## 2023-04-22 ENCOUNTER — Ambulatory Visit (INDEPENDENT_AMBULATORY_CARE_PROVIDER_SITE_OTHER): Payer: BC Managed Care – PPO | Admitting: Family Medicine

## 2023-04-22 ENCOUNTER — Ambulatory Visit (HOSPITAL_BASED_OUTPATIENT_CLINIC_OR_DEPARTMENT_OTHER)
Admission: RE | Admit: 2023-04-22 | Discharge: 2023-04-22 | Disposition: A | Discharge status: Home / Self Care | Payer: BC Managed Care – PPO | Source: Ambulatory Visit | Attending: Family Medicine | Admitting: Family Medicine

## 2023-04-22 ENCOUNTER — Encounter: Payer: Self-pay | Admitting: Family Medicine

## 2023-04-22 VITALS — BP 138/65 | HR 82 | Temp 98.2°F | Ht 65.0 in | Wt 168.0 lb

## 2023-04-22 DIAGNOSIS — K5792 Diverticulitis of intestine, part unspecified, without perforation or abscess without bleeding: Secondary | ICD-10-CM

## 2023-04-22 DIAGNOSIS — R1032 Left lower quadrant pain: Secondary | ICD-10-CM | POA: Diagnosis present

## 2023-04-22 LAB — POC URINALSYSI DIPSTICK (AUTOMATED)
Blood, UA: NEGATIVE
Glucose, UA: NEGATIVE
Nitrite, UA: NEGATIVE
Protein, UA: POSITIVE — AB
Spec Grav, UA: 1.01 (ref 1.010–1.025)
Urobilinogen, UA: 0.2 E.U./dL
pH, UA: 6 (ref 5.0–8.0)

## 2023-04-22 LAB — COMPREHENSIVE METABOLIC PANEL
ALT: 24 U/L (ref 0–35)
AST: 19 U/L (ref 0–37)
Albumin: 4.5 g/dL (ref 3.5–5.2)
Alkaline Phosphatase: 79 U/L (ref 39–117)
BUN: 14 mg/dL (ref 6–23)
CO2: 24 mEq/L (ref 19–32)
Calcium: 10.1 mg/dL (ref 8.4–10.5)
Chloride: 100 mEq/L (ref 96–112)
Creatinine, Ser: 0.78 mg/dL (ref 0.40–1.20)
GFR: 80.32 mL/min (ref >60.00)
Glucose, Bld: 115 mg/dL — ABNORMAL HIGH (ref 70–99)
Potassium: 3.3 mEq/L — ABNORMAL LOW (ref 3.5–5.1)
Sodium: 136 mEq/L (ref 135–145)
Total Bilirubin: 1 mg/dL (ref 0.2–1.2)
Total Protein: 7.9 g/dL (ref 6.0–8.3)

## 2023-04-22 LAB — CBC WITH DIFFERENTIAL/PLATELET
Basophils Absolute: 0 10*3/uL (ref 0.0–0.1)
Basophils Relative: 0.2 % (ref 0.0–3.0)
Eosinophils Absolute: 0.1 10*3/uL (ref 0.0–0.7)
Eosinophils Relative: 1 % (ref 0.0–5.0)
HCT: 40.5 % (ref 36.0–46.0)
Hemoglobin: 13 g/dL (ref 12.0–15.0)
Lymphocytes Relative: 15.4 % (ref 12.0–46.0)
Lymphs Abs: 2.2 10*3/uL (ref 0.7–4.0)
MCHC: 32.2 g/dL (ref 30.0–36.0)
MCV: 91.6 fl (ref 78.0–100.0)
Monocytes Absolute: 1.3 10*3/uL — ABNORMAL HIGH (ref 0.1–1.0)
Monocytes Relative: 9.3 % (ref 3.0–12.0)
Neutro Abs: 10.6 10*3/uL — ABNORMAL HIGH (ref 1.4–7.7)
Neutrophils Relative %: 74.1 % (ref 43.0–77.0)
Platelets: 301 10*3/uL (ref 150.0–400.0)
RBC: 4.42 Mil/uL (ref 3.87–5.11)
RDW: 13.9 % (ref 11.5–15.5)
WBC: 14.3 10*3/uL — ABNORMAL HIGH (ref 4.0–10.5)

## 2023-04-22 MED ORDER — IOHEXOL 300 MG/ML  SOLN
100.0000 mL | Freq: Once | INTRAMUSCULAR | Status: AC | PRN
  Administered 2023-04-22: 100 mL via INTRAVENOUS

## 2023-04-22 NOTE — Patient Instructions (Signed)
Sending off urine culture and getting blood work today Discussed conservative measures with clear liquid diet and monitoring vs empiric antibiotic treatment, but you would like to proceed with CT to confirm first. Order placed.  Please contact office for follow-up if symptoms do not improve or worsen. Seek emergency care if symptoms become severe.

## 2023-04-22 NOTE — Progress Notes (Signed)
Acute Office Visit  Subjective:     Patient ID: Alexandria Bradley, female    DOB: 27-Jul-1961, 62 y.o.   MRN: [id number]  Chief Complaint  Patient presents with   Fever   Abdominal Pain     Patient is in today for LLQ pain, fever.   Discussed the use of AI scribe software for clinical note transcription with the patient, who gave verbal consent to proceed.  History of Present Illness   The patient, with a history of diverticulitis, presents with abdominal discomfort that began on Friday evening. They initially attributed the discomfort to dietary factors, but the pain persisted and evolved over the weekend. The patient also reports a history of sciatica, for which they received a Toradol injection last week. They initially thought the back pain was a rebound from the injection, but the pain localized to the lower abdomen and lower left quadrant by Sunday. The patient has a history of diverticulitis, diagnosed in April, and the current pain is similar to that episode. The pain has now generalized to the entire lower abdomen, but still worse on the left (6/10 currently).  On Monday, the patient developed a fever, with chills in the afternoon and a temperature of 100.41F in the evening. They managed the fever with Tylenol and ibuprofen. The patient reports a pressure-like pain, worse with standing, localized to the left lower abdomen and extending to the pelvic area. They deny nausea, vomiting, and diarrhea but report increased urinary frequency, especially during febrile episodes. She almost went to the ED last night because her pain was so severe. Not quite as severe right now, but reports it is gradually getting worse as the day goes on.   The patient also reports constipation, with the last significant bowel movement on Sunday. They have been passing small amounts daily, but it has been a struggle. There is no reported hematochezia. The patient was hospitalized for the previous episode of  diverticulitis, which was complicated by sepsis. They have had a colonoscopy three years ago and were advised against a repeat unless the diverticulitis became a recurrent issue.          All review of systems negative except what is listed in the HPI      Objective:    BP 138/65   Pulse 82   Temp 98.2 F (36.8 C) (Oral)   Ht 5\' 5"  (1.651 m)   Wt 168 lb (76.2 kg)   SpO2 98%   BMI 27.96 kg/m    Physical Exam Vitals reviewed.  Constitutional:      Appearance: She is well-developed.  Cardiovascular:     Rate and Rhythm: Normal rate and regular rhythm.  Pulmonary:     Effort: Pulmonary effort is normal.     Breath sounds: Normal breath sounds.  Abdominal:     General: Abdomen is flat. Bowel sounds are normal.     Palpations: Abdomen is soft.     Tenderness: There is abdominal tenderness in the right lower quadrant, suprapubic area and left lower quadrant. There is no right CVA tenderness, left CVA tenderness or rebound. Negative signs include Murphy's sign and McBurney's sign.  Neurological:     General: No focal deficit present.     Mental Status: She is alert.  Psychiatric:        Mood and Affect: Mood normal.        Behavior: Behavior normal.     Results for orders placed or performed in visit on 04/22/23  CBC with Differential/Platelet  Result Value Ref Range   WBC 14.3 (H) 4.0 - 10.5 K/uL   RBC 4.42 3.87 - 5.11 Mil/uL   Hemoglobin 13.0 12.0 - 15.0 g/dL   HCT 13.0 86.5 - 78.4 %   MCV 91.6 78.0 - 100.0 fl   MCHC 32.2 30.0 - 36.0 g/dL   RDW 69.6 29.5 - 28.4 %   Platelets 301.0 150.0 - 400.0 K/uL   Neutrophils Relative % 74.1 43.0 - 77.0 %   Lymphocytes Relative 15.4 12.0 - 46.0 %   Monocytes Relative 9.3 3.0 - 12.0 %   Eosinophils Relative 1.0 0.0 - 5.0 %   Basophils Relative 0.2 0.0 - 3.0 %   Neutro Abs 10.6 (H) 1.4 - 7.7 K/uL   Lymphs Abs 2.2 0.7 - 4.0 K/uL   Monocytes Absolute 1.3 (H) 0.1 - 1.0 K/uL   Eosinophils Absolute 0.1 0.0 - 0.7 K/uL    Basophils Absolute 0.0 0.0 - 0.1 K/uL  Comprehensive metabolic panel  Result Value Ref Range   Sodium 136 135 - 145 mEq/L   Potassium 3.3 (L) 3.5 - 5.1 mEq/L   Chloride 100 96 - 112 mEq/L   CO2 24 19 - 32 mEq/L   Glucose, Bld 115 (H) 70 - 99 mg/dL   BUN 14 6 - 23 mg/dL   Creatinine, Ser 1.32 0.40 - 1.20 mg/dL   Total Bilirubin 1.0 0.2 - 1.2 mg/dL   Alkaline Phosphatase 79 39 - 117 U/L   AST 19 0 - 37 U/L   ALT 24 0 - 35 U/L   Total Protein 7.9 6.0 - 8.3 g/dL   Albumin 4.5 3.5 - 5.2 g/dL   GFR 44.01 >02.72 mL/min   Calcium 10.1 8.4 - 10.5 mg/dL  POCT Urinalysis Dipstick (Automated)  Result Value Ref Range   Color, UA dark yellow    Clarity, UA clear    Glucose, UA Negative Negative   Bilirubin, UA moderate    Ketones, UA trace    Spec Grav, UA 1.010 1.010 - 1.025   Blood, UA negative    pH, UA 6.0 5.0 - 8.0   Protein, UA Positive (A) Negative   Urobilinogen, UA 0.2 0.2 or 1.0 E.U./dL   Nitrite, UA negative    Leukocytes, UA Trace (A) Negative        Assessment & Plan:   Problem List Items Addressed This Visit   None Visit Diagnoses     LLQ pain    -  Primary UA mildly abnormal with signs of dehydration, possible UTI, and bili - will get culture and CBC, CMP today. Last CT in April showed mild diverticulitis, but she was hospitalized with sepsis. For her current symptoms, we discussed conservative measures (clear liquid diet, pain control) vs empiric antibiotics vs repeat CT for definitive diagnosis. Patient states she will be traveling in 2 weeks and really wants to know what is going on so she can be well enough for her trip. Ordering CT today. Patient aware of signs/symptoms requiring further/urgent evaluation.    Relevant Orders   CBC with Differential/Platelet   Comprehensive metabolic panel   CT ABDOMEN PELVIS W CONTRAST   Urine Culture       No orders of the defined types were placed in this encounter.   Return if symptoms worsen or fail to  improve.  Clayborne Dana, NP

## 2023-04-23 ENCOUNTER — Encounter (HOSPITAL_COMMUNITY): Payer: Self-pay | Admitting: Emergency Medicine

## 2023-04-23 ENCOUNTER — Other Ambulatory Visit: Payer: Self-pay

## 2023-04-23 ENCOUNTER — Telehealth: Payer: Self-pay | Admitting: Gastroenterology

## 2023-04-23 ENCOUNTER — Observation Stay (HOSPITAL_COMMUNITY)
Admission: EM | Admit: 2023-04-23 | Discharge: 2023-04-24 | Disposition: A | Discharge status: Home / Self Care | Payer: BC Managed Care – PPO | Source: Home / Self Care | Attending: Internal Medicine | Admitting: Internal Medicine

## 2023-04-23 DIAGNOSIS — E871 Hypo-osmolality and hyponatremia: Secondary | ICD-10-CM | POA: Insufficient documentation

## 2023-04-23 DIAGNOSIS — Z853 Personal history of malignant neoplasm of breast: Secondary | ICD-10-CM | POA: Diagnosis not present

## 2023-04-23 DIAGNOSIS — K5720 Diverticulitis of large intestine with perforation and abscess without bleeding: Principal | ICD-10-CM | POA: Insufficient documentation

## 2023-04-23 DIAGNOSIS — E785 Hyperlipidemia, unspecified: Secondary | ICD-10-CM | POA: Diagnosis present

## 2023-04-23 DIAGNOSIS — E876 Hypokalemia: Secondary | ICD-10-CM | POA: Diagnosis not present

## 2023-04-23 DIAGNOSIS — E8729 Other acidosis: Secondary | ICD-10-CM | POA: Diagnosis present

## 2023-04-23 DIAGNOSIS — G4733 Obstructive sleep apnea (adult) (pediatric): Secondary | ICD-10-CM | POA: Diagnosis present

## 2023-04-23 DIAGNOSIS — R7303 Prediabetes: Secondary | ICD-10-CM | POA: Diagnosis present

## 2023-04-23 DIAGNOSIS — Z79899 Other long term (current) drug therapy: Secondary | ICD-10-CM | POA: Diagnosis not present

## 2023-04-23 DIAGNOSIS — E039 Hypothyroidism, unspecified: Secondary | ICD-10-CM | POA: Diagnosis not present

## 2023-04-23 DIAGNOSIS — Z87891 Personal history of nicotine dependence: Secondary | ICD-10-CM | POA: Insufficient documentation

## 2023-04-23 DIAGNOSIS — E8809 Other disorders of plasma-protein metabolism, not elsewhere classified: Secondary | ICD-10-CM | POA: Insufficient documentation

## 2023-04-23 DIAGNOSIS — I10 Essential (primary) hypertension: Secondary | ICD-10-CM | POA: Diagnosis not present

## 2023-04-23 DIAGNOSIS — R1032 Left lower quadrant pain: Secondary | ICD-10-CM | POA: Diagnosis present

## 2023-04-23 DIAGNOSIS — E872 Acidosis, unspecified: Secondary | ICD-10-CM | POA: Diagnosis not present

## 2023-04-23 DIAGNOSIS — K5792 Diverticulitis of intestine, part unspecified, without perforation or abscess without bleeding: Principal | ICD-10-CM

## 2023-04-23 LAB — CBC WITH DIFFERENTIAL/PLATELET
Abs Immature Granulocytes: 0.03 10*3/uL (ref 0.00–0.07)
Basophils Absolute: 0 10*3/uL (ref 0.0–0.1)
Basophils Relative: 0 %
Eosinophils Absolute: 0.2 10*3/uL (ref 0.0–0.5)
Eosinophils Relative: 2 %
HCT: 38.8 % (ref 36.0–46.0)
Hemoglobin: 12.8 g/dL (ref 12.0–15.0)
Immature Granulocytes: 0 %
Lymphocytes Relative: 13 %
Lymphs Abs: 1.1 10*3/uL (ref 0.7–4.0)
MCH: 30 pg (ref 26.0–34.0)
MCHC: 33 g/dL (ref 30.0–36.0)
MCV: 90.9 fL (ref 80.0–100.0)
Monocytes Absolute: 0.7 10*3/uL (ref 0.1–1.0)
Monocytes Relative: 8 %
Neutro Abs: 7 10*3/uL (ref 1.7–7.7)
Neutrophils Relative %: 77 %
Platelets: 289 10*3/uL (ref 150–400)
RBC: 4.27 MIL/uL (ref 3.87–5.11)
RDW: 13 % (ref 11.5–15.5)
WBC: 9.1 10*3/uL (ref 4.0–10.5)
nRBC: 0 % (ref 0.0–0.2)

## 2023-04-23 LAB — COMPREHENSIVE METABOLIC PANEL
ALT: 27 U/L (ref 0–44)
AST: 19 U/L (ref 15–41)
Albumin: 4.4 g/dL (ref 3.5–5.0)
Alkaline Phosphatase: 84 U/L (ref 38–126)
Anion gap: 17 — ABNORMAL HIGH (ref 5–15)
BUN: 15 mg/dL (ref 8–23)
CO2: 19 mmol/L — ABNORMAL LOW (ref 22–32)
Calcium: 9.8 mg/dL (ref 8.9–10.3)
Chloride: 98 mmol/L (ref 98–111)
Creatinine, Ser: 0.77 mg/dL (ref 0.44–1.00)
GFR, Estimated: >60 mL/min (ref >60)
Glucose, Bld: 102 mg/dL — ABNORMAL HIGH (ref 70–99)
Potassium: 3.1 mmol/L — ABNORMAL LOW (ref 3.5–5.1)
Sodium: 134 mmol/L — ABNORMAL LOW (ref 135–145)
Total Bilirubin: 1 mg/dL (ref 0.3–1.2)
Total Protein: 8.3 g/dL — ABNORMAL HIGH (ref 6.5–8.1)

## 2023-04-23 LAB — URINALYSIS, ROUTINE W REFLEX MICROSCOPIC
Bilirubin Urine: NEGATIVE
Glucose, UA: NEGATIVE mg/dL
Hgb urine dipstick: NEGATIVE
Ketones, ur: NEGATIVE mg/dL
Nitrite: NEGATIVE
Protein, ur: NEGATIVE mg/dL
Specific Gravity, Urine: 1.006 (ref 1.005–1.030)
pH: 7 (ref 5.0–8.0)

## 2023-04-23 LAB — URINE CULTURE
MICRO NUMBER:: 15235079
Result:: NO GROWTH
SPECIMEN QUALITY:: ADEQUATE

## 2023-04-23 LAB — HEMOGLOBIN A1C
Hgb A1c MFr Bld: 5.7 % — ABNORMAL HIGH (ref 4.8–5.6)
Mean Plasma Glucose: 116.89 mg/dL

## 2023-04-23 LAB — MAGNESIUM: Magnesium: 2.3 mg/dL (ref 1.7–2.4)

## 2023-04-23 LAB — GLUCOSE, CAPILLARY: Glucose-Capillary: 114 mg/dL — ABNORMAL HIGH (ref 70–99)

## 2023-04-23 LAB — CULTURE, BLOOD (ROUTINE X 2)

## 2023-04-23 LAB — I-STAT CG4 LACTIC ACID, ED: Lactic Acid, Venous: 0.9 mmol/L (ref 0.5–1.9)

## 2023-04-23 LAB — LIPASE, BLOOD: Lipase: 29 U/L (ref 11–51)

## 2023-04-23 LAB — PHOSPHORUS: Phosphorus: 2.7 mg/dL (ref 2.5–4.6)

## 2023-04-23 MED ORDER — ALLOPURINOL 100 MG PO TABS
100.0000 mg | ORAL_TABLET | Freq: Every day | ORAL | Status: DC
  Administered 2023-04-24: 100 mg via ORAL
  Filled 2023-04-23: qty 1

## 2023-04-23 MED ORDER — POTASSIUM CHLORIDE IN NACL 20-0.9 MEQ/L-% IV SOLN
INTRAVENOUS | Status: AC
  Filled 2023-04-23 (×2): qty 1000

## 2023-04-23 MED ORDER — PANTOPRAZOLE SODIUM 40 MG IV SOLR
40.0000 mg | Freq: Every day | INTRAVENOUS | Status: DC
  Administered 2023-04-23 – 2023-04-24 (×2): 40 mg via INTRAVENOUS
  Filled 2023-04-23 (×2): qty 10

## 2023-04-23 MED ORDER — LEVOTHYROXINE SODIUM 150 MCG PO TABS
150.0000 ug | ORAL_TABLET | Freq: Every day | ORAL | Status: DC
  Administered 2023-04-24: 150 ug via ORAL
  Filled 2023-04-23: qty 1

## 2023-04-23 MED ORDER — ACETAMINOPHEN 650 MG RE SUPP: 650.0000 mg | Freq: Four times a day (QID) | RECTAL | Status: DC | PRN

## 2023-04-23 MED ORDER — ONDANSETRON HCL 4 MG PO TABS: 4.0000 mg | ORAL_TABLET | Freq: Four times a day (QID) | ORAL | Status: DC | PRN

## 2023-04-23 MED ORDER — LIOTHYRONINE SODIUM 5 MCG PO TABS
5.0000 ug | ORAL_TABLET | Freq: Every day | ORAL | Status: DC
  Administered 2023-04-24: 5 ug via ORAL
  Filled 2023-04-23: qty 1

## 2023-04-23 MED ORDER — LORATADINE 10 MG PO TABS
10.0000 mg | ORAL_TABLET | Freq: Every day | ORAL | Status: DC
  Administered 2023-04-24: 10 mg via ORAL
  Filled 2023-04-23: qty 1

## 2023-04-23 MED ORDER — PIPERACILLIN-TAZOBACTAM 3.375 G IVPB
3.3750 g | Freq: Three times a day (TID) | INTRAVENOUS | Status: DC
  Administered 2023-04-23 – 2023-04-24 (×2): 3.375 g via INTRAVENOUS
  Filled 2023-04-23 (×2): qty 50

## 2023-04-23 MED ORDER — ONDANSETRON HCL 4 MG/2ML IJ SOLN: 4.0000 mg | Freq: Four times a day (QID) | INTRAMUSCULAR | Status: DC | PRN

## 2023-04-23 MED ORDER — HYDROMORPHONE HCL 1 MG/ML IJ SOLN
0.5000 mg | Freq: Once | INTRAMUSCULAR | Status: AC
  Administered 2023-04-23: 0.5 mg via INTRAVENOUS
  Filled 2023-04-23: qty 1

## 2023-04-23 MED ORDER — POTASSIUM CHLORIDE 10 MEQ/100ML IV SOLN
10.0000 meq | INTRAVENOUS | Status: AC
  Administered 2023-04-23 (×3): 10 meq via INTRAVENOUS
  Filled 2023-04-23 (×3): qty 100

## 2023-04-23 MED ORDER — ACETAMINOPHEN 325 MG PO TABS: 650.0000 mg | ORAL_TABLET | Freq: Four times a day (QID) | ORAL | Status: DC | PRN

## 2023-04-23 MED ORDER — LACTATED RINGERS IV BOLUS
1000.0000 mL | Freq: Once | INTRAVENOUS | Status: AC
  Administered 2023-04-23: 1000 mL via INTRAVENOUS

## 2023-04-23 MED ORDER — AMOXICILLIN-POT CLAVULANATE 875-125 MG PO TABS
1.0000 | ORAL_TABLET | Freq: Two times a day (BID) | ORAL | 0 refills | Status: AC
Start: 2023-04-23 — End: 2023-05-07

## 2023-04-23 MED ORDER — ENOXAPARIN SODIUM 40 MG/0.4ML IJ SOSY
40.0000 mg | PREFILLED_SYRINGE | INTRAMUSCULAR | Status: DC
  Administered 2023-04-23: 40 mg via SUBCUTANEOUS
  Filled 2023-04-23: qty 0.4

## 2023-04-23 MED ORDER — DILTIAZEM HCL ER COATED BEADS 240 MG PO CP24
240.0000 mg | ORAL_CAPSULE | Freq: Every day | ORAL | Status: DC
  Administered 2023-04-24: 240 mg via ORAL
  Filled 2023-04-23: qty 1

## 2023-04-23 MED ORDER — HYDROMORPHONE HCL 1 MG/ML IJ SOLN: 0.5000 mg | INTRAMUSCULAR | Status: DC | PRN

## 2023-04-23 MED ORDER — PIPERACILLIN-TAZOBACTAM 3.375 G IVPB 30 MIN
3.3750 g | Freq: Once | INTRAVENOUS | Status: AC
  Administered 2023-04-23: 3.375 g via INTRAVENOUS
  Filled 2023-04-23: qty 50

## 2023-04-23 NOTE — ED Notes (Signed)
ED TO INPATIENT HANDOFF REPORT  Name/Age/Gender Alexandria Bradley 62 y.o. female  Code Status    Code Status Orders  (From admission, onward)           Start     Ordered   04/23/23 1429  Full code  Continuous       Question:  By:  Answer:  Consent: discussion documented in EHR   04/23/23 1429           Code Status History     Date Active Date Inactive Code Status Order ID Comments User Context   01/08/2023 2044 01/11/2023 2039 Full Code [id number]  Lurline Del, MD Inpatient   04/12/2019 1427 04/13/2019 2027 Full Code [id number]  Emelia Loron, MD Inpatient   07/24/2018 0852 07/24/2018 1500 Full Code [id number]  Lennette Bihari, MD Inpatient       Home/SNF/Other Home  Chief Complaint Perforation of sigmoid colon due to diverticulitis [K57.20]  Level of Care/Admitting Diagnosis ED Disposition     ED Disposition  Admit   Condition  --   Comment  Hospital Area: [facility] [[id number]]  Level of Care: Telemetry [5]  Admit to tele based on following criteria: Monitor for Ischemic changes  May admit patient to [facility] or [facility] if equivalent level of care is available:: No  Covid Evaluation: Asymptomatic - no recent exposure (last 10 days) testing not required  Diagnosis: Perforation of sigmoid colon due to diverticulitis [1660630]  Admitting Physician: Bobette Mo [[id number]]  Attending Physician: Bobette Mo [[id number]]  Certification:: I certify this patient will need inpatient services for at least 2 midnights  Estimated Length of Stay: 2          Medical History Past Medical History:  Diagnosis Date   Anxiety    Arthritis    Back pain    Breast cancer (HCC)    left   Fatty liver    GERD (gastroesophageal reflux disease)    Gout    Headache(784.0)    PMH : migraines   History of kidney stones    Hypercholesterolemia    Hypertension    Hypothyroidism    Osteoporosis    Personal history of  chemotherapy    Personal history of radiation therapy    PONV (postoperative nausea and vomiting)    difficult to wake up   Pre-diabetes    Psoriasis    PVC's (premature ventricular contractions)    Radiation 08/18/14-10/07/14   Left breast   Sleep apnea    no CPAP    Allergies Allergies  Allergen Reactions   Chlorhexidine Itching   Ciprofloxacin Anxiety, Other (See Comments), Rash and Swelling    GI upset   Prednisone Shortness Of Breath and Other (See Comments)    Jittery, "jacks me up"     Codeine Nausea Only   Cortisone Swelling and Other (See Comments)    "hyper sensitive, jacks me up"    Colchicine Nausea Only    IV Location/Drains/Wounds Patient Lines/Drains/Airways Status     Active Line/Drains/Airways     Name Placement date Placement time Site Days   Peripheral IV 04/23/23 20 G 1" Right Antecubital 04/23/23  1209  Antecubital  less than 1   Peripheral IV 04/23/23 20 G 1" Posterior;Right Forearm 04/23/23  1229  Forearm  less than 1   Closed System Drain 1 Right;Lateral Chest Bulb (JP) 15 Fr. 04/12/19  0945  Chest  1472   Closed System Drain 2 Right;Medial  Chest Bulb (JP) 19 Fr. 04/12/19  0946  Chest  1472   Closed System Drain 4 Left;Medial;Superior Chest Bulb (JP) 19 Fr. 04/12/19  1148  Chest  1472   Closed System Drain 1 Right Chest Bulb (JP) 19 Fr. 06/02/19  1510  Chest  1421   Closed System Drain 1 Right Breast Bulb (JP) 15 Fr. 01/24/20  0812  Breast  1185   Closed System Drain 2 Right Breast Bulb (JP) 19 Fr. 01/24/20  0816  Breast  1185            Labs/Imaging Results for orders placed or performed during the hospital encounter of 04/23/23 (from the past 48 hour(s))  CBC with Differential     Status: None   Collection Time: 04/23/23 12:10 PM  Result Value Ref Range   WBC 9.1 4.0 - 10.5 K/uL   RBC 4.27 3.87 - 5.11 MIL/uL   Hemoglobin 12.8 12.0 - 15.0 g/dL   HCT 60.4 54.0 - 98.1 %   MCV 90.9 80.0 - 100.0 fL   MCH 30.0 26.0 - 34.0 pg   MCHC 33.0  30.0 - 36.0 g/dL   RDW 19.1 47.8 - 29.5 %   Platelets 289 150 - 400 K/uL   nRBC 0.0 0.0 - 0.2 %   Neutrophils Relative % 77 %   Neutro Abs 7.0 1.7 - 7.7 K/uL   Lymphocytes Relative 13 %   Lymphs Abs 1.1 0.7 - 4.0 K/uL   Monocytes Relative 8 %   Monocytes Absolute 0.7 0.1 - 1.0 K/uL   Eosinophils Relative 2 %   Eosinophils Absolute 0.2 0.0 - 0.5 K/uL   Basophils Relative 0 %   Basophils Absolute 0.0 0.0 - 0.1 K/uL   Immature Granulocytes 0 %   Abs Immature Granulocytes 0.03 0.00 - 0.07 K/uL    Comment: Performed at [facility], [address], [place], [place] [address]  Comprehensive metabolic panel     Status: Abnormal   Collection Time: 04/23/23 12:10 PM  Result Value Ref Range   Sodium 134 (L) 135 - 145 mmol/L   Potassium 3.1 (L) 3.5 - 5.1 mmol/L   Chloride 98 98 - 111 mmol/L   CO2 19 (L) 22 - 32 mmol/L   Glucose, Bld 102 (H) 70 - 99 mg/dL    Comment: Glucose reference range applies only to samples taken after fasting for at least 8 hours.   BUN 15 8 - 23 mg/dL   Creatinine, Ser 8.65 0.44 - 1.00 mg/dL   Calcium 9.8 8.9 - 78.4 mg/dL   Total Protein 8.3 (H) 6.5 - 8.1 g/dL   Albumin 4.4 3.5 - 5.0 g/dL   AST 19 15 - 41 U/L   ALT 27 0 - 44 U/L   Alkaline Phosphatase 84 38 - 126 U/L   Total Bilirubin 1.0 0.3 - 1.2 mg/dL   GFR, Estimated >69 >62 mL/min    Comment: (NOTE) Calculated using the CKD-EPI Creatinine Equation (2021)    Anion gap 17 (H) 5 - 15    Comment: Performed at [facility], [address], [place], [place] [address]  Lipase, blood     Status: None   Collection Time: 04/23/23 12:10 PM  Result Value Ref Range   Lipase 29 11 - 51 U/L    Comment: Performed at [facility], [address], [place], [place] [address]  Urinalysis, Routine w reflex microscopic -Urine, Clean Catch     Status: Abnormal  Collection Time: 04/23/23 12:15 PM  Result Value Ref Range   Color, Urine YELLOW YELLOW   APPearance  CLEAR CLEAR   Specific Gravity, Urine 1.006 1.005 - 1.030   pH 7.0 5.0 - 8.0   Glucose, UA NEGATIVE NEGATIVE mg/dL   Hgb urine dipstick NEGATIVE NEGATIVE   Bilirubin Urine NEGATIVE NEGATIVE   Ketones, ur NEGATIVE NEGATIVE mg/dL   Protein, ur NEGATIVE NEGATIVE mg/dL   Nitrite NEGATIVE NEGATIVE   Leukocytes,Ua TRACE (A) NEGATIVE   RBC / HPF 0-5 0 - 5 RBC/hpf   WBC, UA 0-5 0 - 5 WBC/hpf   Bacteria, UA RARE (A) NONE SEEN   Squamous Epithelial / HPF 0-5 0 - 5 /HPF    Comment: Performed at [facility], [address] [place], [place] [address]  I-Stat Lactic Acid     Status: None   Collection Time: 04/23/23 12:17 PM  Result Value Ref Range   Lactic Acid, Venous 0.9 0.5 - 1.9 mmol/L   CT ABDOMEN PELVIS W CONTRAST  Result Date: 04/22/2023 CLINICAL DATA:  Left lower quadrant pain EXAM: CT ABDOMEN AND PELVIS WITH CONTRAST TECHNIQUE: Multidetector CT imaging of the abdomen and pelvis was performed using the standard protocol following bolus administration of intravenous contrast. RADIATION DOSE REDUCTION: This exam was performed according to the departmental dose-optimization program which includes automated exposure control, adjustment of the mA and/or kV according to patient size and/or use of iterative reconstruction technique. CONTRAST:  OMNIPAQUE IOHEXOL 300 MG/ML  SOLN COMPARISON:  CT abdomen and pelvis 01/08/2023 FINDINGS: Lower chest: Twos 1 Hepatobiliary: No focal liver abnormality is seen. Status post cholecystectomy. No biliary dilatation. Pancreas: Unremarkable. No pancreatic ductal dilatation or surrounding inflammatory changes. Spleen: Normal in size without focal abnormality. Adrenals/Urinary Tract: Adrenal glands are unremarkable. Kidneys are normal, without renal calculi, focal lesion, or hydronephrosis. Bladder is unremarkable. Stomach/Bowel: There is sigmoid colon diverticulosis. There is mild wall thickening and inflammation involving the mid sigmoid colon  compatible with acute diverticulitis. There are tiny foci of extraluminal gas in this region compatible with micro perforation. There is no evidence for abscess. There is no bowel obstruction. There is a large amount of stool throughout the colon. The appendix is not seen. Small bowel and stomach are within normal limits. Vascular/Lymphatic: Aortic atherosclerosis. No enlarged abdominal or pelvic lymph nodes. Reproductive: Uterus and bilateral adnexa are unremarkable. Other: No abdominal wall hernia or abnormality. No abdominopelvic ascites. Bilateral breast implants are present. Musculoskeletal: No acute or significant osseous findings. IMPRESSION: 1. Acute sigmoid colon diverticulitis with micro perforation. No abscess. Aortic Atherosclerosis (ICD10-I70.0). Electronically Signed   By: Darliss Cheney M.D.   On: 04/22/2023 20:02    Pending Labs Unresulted Labs (From admission, onward)     Start     Ordered   04/24/23 0500  CBC  Tomorrow morning,   R        04/23/23 1429   04/24/23 0500  Comprehensive metabolic panel  Tomorrow morning,   R        04/23/23 1429   04/23/23 1429  Magnesium  Add-on,   AD        04/23/23 1428   04/23/23 1429  Phosphorus  Add-on,   AD        04/23/23 1428   04/23/23 1155  Blood culture (routine x 2)  BLOOD CULTURE X 2,   R (with STAT occurrences)      04/23/23 1154  Vitals/Pain Today's Vitals   04/23/23 1136 04/23/23 1137 04/23/23 1300 04/23/23 1321  BP:   135/63   Pulse:   62   Resp:   19   Temp:      TempSrc:      SpO2:   96%   Weight:  76 kg    Height:  5\' 5"  (1.651 m)    PainSc: 9    4     Isolation Precautions No active isolations  Medications Medications  piperacillin-tazobactam (ZOSYN) IVPB 3.375 g (has no administration in time range)  0.9 % NaCl with KCl 20 mEq/ L  infusion (has no administration in time range)  potassium chloride 10 mEq in 100 mL IVPB (has no administration in time range)  enoxaparin (LOVENOX) injection 40 mg  (has no administration in time range)  acetaminophen (TYLENOL) tablet 650 mg (has no administration in time range)    Or  acetaminophen (TYLENOL) suppository 650 mg (has no administration in time range)  ondansetron (ZOFRAN) tablet 4 mg (has no administration in time range)    Or  ondansetron (ZOFRAN) injection 4 mg (has no administration in time range)  HYDROmorphone (DILAUDID) injection 0.5 mg (has no administration in time range)  pantoprazole (PROTONIX) injection 40 mg (has no administration in time range)  HYDROmorphone (DILAUDID) injection 0.5 mg (0.5 mg Intravenous Given 04/23/23 1210)  lactated ringers bolus 1,000 mL (0 mLs Intravenous Stopped 04/23/23 1403)  piperacillin-tazobactam (ZOSYN) IVPB 3.375 g (0 g Intravenous Stopped 04/23/23 1402)    Mobility Walks with SBA  Patient is A&Ox4, VSS, and on RA.

## 2023-04-23 NOTE — ED Triage Notes (Signed)
Pt reports recent diverticulitis. Abd pain started this weekend that felt similar. Pt had Ct yesterday that showed diverticulitis with micro perforation. Endorses generalized abd pain and swelling.

## 2023-04-23 NOTE — Telephone Encounter (Signed)
If symptoms are not well-controlled she is advised to call and will consider course of budesonide for possible lymphocytic colitis.

## 2023-04-23 NOTE — ED Provider Notes (Signed)
[place] [facility]ERGENCY DEPARTMENT AT [facility] Provider Note   CSN: [id number] Arrival date & time: 04/23/23  1129     History  Chief Complaint  Patient presents with   Abdominal Pain    Alexandria Bradley is a 61 y.o. female.   Abdominal Pain 62 year old female presenting for abdominal pain.  She states over the last few days she has had left lower quadrant abdominal pain, loose stool, nausea.  No vomiting, no melena or hematochezia.  She saw her PCP yesterday who did a CT scan concerning for diverticulitis with microperforation.  She has not had any recent antibiotics.  No chest pain or difficulty breathing.  No fevers.  Some chills.     Home Medications Prior to Admission medications   Medication Sig Start Date End Date Taking? Authorizing Provider  albuterol (VENTOLIN HFA) 108 (90 Base) MCG/ACT inhaler Inhale 2 puffs into the lungs every 6 (six) hours as needed for wheezing or shortness of breath (Cough). 09/24/22  Yes Theadora Rama Scales, PA-C  allopurinol (ZYLOPRIM) 100 MG tablet TAKE 1 TABLET BY MOUTH EVERY DAY 04/04/23  Yes Sharlene Dory, DO  betamethasone dipropionate 0.05 % cream Apply 1 Application topically daily. 04/07/23  Yes [provider]  diltiazem (CARDIZEM CD) 240 MG 24 hr capsule TAKE 1 CAPSULE BY MOUTH EVERY DAY 04/15/23  Yes Wendling, Jilda Roche, DO  fexofenadine (ALLEGRA) 180 MG tablet Take 180 mg by mouth daily.   Yes [provider]  Levothyroxine Sodium (TIROSINT) 150 MCG CAPS Take 150 mcg by mouth daily before breakfast.   Yes [provider]  liothyronine (CYTOMEL) 5 MCG tablet Take 5 mcg by mouth daily before breakfast.  11/13/13  Yes [provider]  mometasone (NASONEX) 50 MCG/ACT nasal spray Place 2 sprays into the nose daily. Patient taking differently: Place 2 sprays into the nose daily as needed (allergies). 12/17/22  Yes Sharlene Dory, DO  triamterene-hydrochlorothiazide  (MAXZIDE-25) 37.5-25 MG tablet Take 1 tablet by mouth daily. 05/13/22  Yes Wendall Stade, MD  amoxicillin-clavulanate (AUGMENTIN) 875-125 MG tablet Take 1 tablet by mouth 2 (two) times daily for 14 days. Patient not taking: Reported on 04/23/2023 04/23/23 05/07/23  Clayborne Dana, NP  dicyclomine (BENTYL) 10 MG capsule Take 1 capsule (10 mg total) by mouth 3 (three) times daily before meals. Patient not taking: Reported on 04/23/2023 03/12/23   Meryl Dare, MD  triamcinolone cream (KENALOG) 0.1 % Apply 1 application topically 2 (two) times daily. Patient not taking: Reported on 04/23/2023 11/26/21   Sharlene Dory, DO  prochlorperazine (COMPAZINE) 10 MG tablet Take 1 tablet (10 mg total) by mouth every 6 (six) hours as needed (Nausea or vomiting). 11/13/18 05/11/19  Magrinat, Valentino Hue, MD      Allergies    Chlorhexidine, Ciprofloxacin, Prednisone, Codeine, Cortisone, and Colchicine    Review of Systems   Review of Systems  Gastrointestinal:  Positive for abdominal pain.  Review of systems completed and notable as per HPI.  ROS otherwise negative.   Physical Exam Updated Vital Signs BP 122/79 (BP Location: Right Arm)   Pulse (!) 58   Temp 98.2 F (36.8 C) (Oral)   Resp 16   Ht 5\' 5"  (1.651 m)   Wt 76 kg   SpO2 100%   BMI 27.88 kg/m  Physical Exam Vitals and nursing note reviewed.  Constitutional:      General: She is not in acute distress.    Appearance: She is well-developed.  HENT:     Head: Normocephalic and atraumatic.  Eyes:     Conjunctiva/sclera: Conjunctivae normal.  Cardiovascular:     Rate and Rhythm: Normal rate and regular rhythm.     Heart sounds: No murmur heard. Pulmonary:     Effort: Pulmonary effort is normal. No respiratory distress.     Breath sounds: Normal breath sounds.  Abdominal:     Palpations: Abdomen is soft.     Tenderness: There is abdominal tenderness in the suprapubic area and left lower quadrant. There is no right CVA tenderness,  left CVA tenderness, guarding or rebound.  Musculoskeletal:        General: No swelling.     Cervical back: Neck supple.  Skin:    General: Skin is warm and dry.     Capillary Refill: Capillary refill takes less than 2 seconds.  Neurological:     Mental Status: She is alert.  Psychiatric:        Mood and Affect: Mood normal.     ED Results / Procedures / Treatments   Labs (all labs ordered are listed, but only abnormal results are displayed) Labs Reviewed  COMPREHENSIVE METABOLIC PANEL - Abnormal; Notable for the following components:      Result Value   Sodium 134 (*)    Potassium 3.1 (*)    CO2 19 (*)    Glucose, Bld 102 (*)    Total Protein 8.3 (*)    Anion gap 17 (*)    All other components within normal limits  URINALYSIS, ROUTINE W REFLEX MICROSCOPIC - Abnormal; Notable for the following components:   Leukocytes,Ua TRACE (*)    Bacteria, UA RARE (*)    All other components within normal limits  CULTURE, BLOOD (ROUTINE X 2)  CULTURE, BLOOD (ROUTINE X 2)  CBC WITH DIFFERENTIAL/PLATELET  LIPASE, BLOOD  MAGNESIUM  PHOSPHORUS  CBC  COMPREHENSIVE METABOLIC PANEL  I-STAT CG4 LACTIC ACID, ED    EKG None  Radiology CT ABDOMEN PELVIS W CONTRAST  Result Date: 04/22/2023 CLINICAL DATA:  Left lower quadrant pain EXAM: CT ABDOMEN AND PELVIS WITH CONTRAST TECHNIQUE: Multidetector CT imaging of the abdomen and pelvis was performed using the standard protocol following bolus administration of intravenous contrast. RADIATION DOSE REDUCTION: This exam was performed according to the departmental dose-optimization program which includes automated exposure control, adjustment of the mA and/or kV according to patient size and/or use of iterative reconstruction technique. CONTRAST:  OMNIPAQUE IOHEXOL 300 MG/ML  SOLN COMPARISON:  CT abdomen and pelvis 01/08/2023 FINDINGS: Lower chest: Twos 1 Hepatobiliary: No focal liver abnormality is seen. Status post cholecystectomy. No biliary  dilatation. Pancreas: Unremarkable. No pancreatic ductal dilatation or surrounding inflammatory changes. Spleen: Normal in size without focal abnormality. Adrenals/Urinary Tract: Adrenal glands are unremarkable. Kidneys are normal, without renal calculi, focal lesion, or hydronephrosis. Bladder is unremarkable. Stomach/Bowel: There is sigmoid colon diverticulosis. There is mild wall thickening and inflammation involving the mid sigmoid colon compatible with acute diverticulitis. There are tiny foci of extraluminal gas in this region compatible with micro perforation. There is no evidence for abscess. There is no bowel obstruction. There is a large amount of stool throughout the colon. The appendix is not seen. Small bowel and stomach are within normal limits. Vascular/Lymphatic: Aortic atherosclerosis. No enlarged abdominal or pelvic lymph nodes. Reproductive: Uterus and bilateral adnexa are unremarkable. Other: No abdominal wall hernia or abnormality. No abdominopelvic ascites. Bilateral breast implants are present. Musculoskeletal: No acute or significant osseous findings. IMPRESSION: 1. Acute  sigmoid colon diverticulitis with micro perforation. No abscess. Aortic Atherosclerosis (ICD10-I70.0). Electronically Signed   By: Darliss Cheney M.D.   On: 04/22/2023 20:02    Procedures Procedures    Medications Ordered in ED Medications  piperacillin-tazobactam (ZOSYN) IVPB 3.375 g (has no administration in time range)  0.9 % NaCl with KCl 20 mEq/ L  infusion (has no administration in time range)  potassium chloride 10 mEq in 100 mL IVPB (0 mEq Intravenous Stopped 04/23/23 1737)  enoxaparin (LOVENOX) injection 40 mg (has no administration in time range)  acetaminophen (TYLENOL) tablet 650 mg (has no administration in time range)    Or  acetaminophen (TYLENOL) suppository 650 mg (has no administration in time range)  ondansetron (ZOFRAN) tablet 4 mg (has no administration in time range)    Or  ondansetron  (ZOFRAN) injection 4 mg (has no administration in time range)  HYDROmorphone (DILAUDID) injection 0.5 mg (has no administration in time range)  pantoprazole (PROTONIX) injection 40 mg (40 mg Intravenous Given 04/23/23 1512)  HYDROmorphone (DILAUDID) injection 0.5 mg (0.5 mg Intravenous Given 04/23/23 1210)  lactated ringers bolus 1,000 mL (0 mLs Intravenous Stopped 04/23/23 1403)  piperacillin-tazobactam (ZOSYN) IVPB 3.375 g (0 g Intravenous Stopped 04/23/23 1402)    ED Course/ Medical Decision Making/ A&P                             Medical Decision Making Amount and/or Complexity of Data Reviewed Labs: ordered.  Risk Prescription drug management. Decision regarding hospitalization.   Medical Decision Making:   TEQUILA ROTTMANN is a 62 y.o. female who presented to the ED today with left lower quadrant pain and CT scan concerning for perforation of diverticulitis.  Vital signs reviewed.  Exam she is well-appearing, left lower quadrant tenderness without peritonitis.  No indication for repeat imaging, no abscess seen on imaging yesterday.   Patient placed on continuous vitals and telemetry monitoring while in ED which was reviewed periodically.  Reviewed and confirmed nursing documentation for past medical history, family history, social history.  Initial Study Results:   Laboratory  All laboratory results reviewed.  Labs notable for lactic acid normal, mild anion gap metabolic acidosis.  Mild hypokalemia.  CBC unremarkable.   Consults: Case discussed with general surgery.   Reassessment and Plan:   Discussed with general surgery, they recommend medicine admission for IV antibiotics.  Antibiotics ordered.  Discussed with hospitalist and admitted for management of diverticulitis with perforation.   Patient's presentation is most consistent with acute presentation with potential threat to life or bodily function.           Final Clinical Impression(s) / ED Diagnoses Final  diagnoses:  Diverticulitis    Rx / DC Orders ED Discharge Orders     None         Laurence Spates, MD 04/23/23 1840

## 2023-04-23 NOTE — Addendum Note (Signed)
Addended by: Hyman Hopes B on: 04/23/2023 08:13 AM   Modules accepted: Orders

## 2023-04-23 NOTE — Telephone Encounter (Signed)
The pt saw PCP yesterday and was told she has diverticulitis with micro perf.  She was told by PCP to go to the ED for eval and treatment.  She wants to let Dr Russella Dar know that she is heading to [facility] now.  She says she is feeling worse and pain has increased.  I will send as an FYI to Dr Russella Dar.  (I did get the pt set up for follow up with Dr Ardell Isaacs next available appt)   Dr Russella Dar I see that you mentioned budesonide at last office visit.  Please advise if you want to send that or wait for the ED eval and treatment?

## 2023-04-23 NOTE — Progress Notes (Signed)
A consult was received from an ED physician for zosyn   per pharmacy dosing.  The patient's profile has been reviewed for ht/wt/allergies/indication/available labs.    A one time order has been placed for zosyn 3.375 gm IV x1 over 30 min.  Further antibiotics/pharmacy consults should be ordered by admitting physician if indicated.                       Thank you, Lucia Gaskins 04/23/2023  12:05 PM

## 2023-04-23 NOTE — Telephone Encounter (Signed)
Hold on budesonide until diverticulitis is appropriately treated.

## 2023-04-23 NOTE — Consult Note (Addendum)
[facility] Surgery Consult Note  GRAVIELA Bradley 05/23/1961  [id number].    Requesting MD: Fulton Reek Chief Complaint/Reason for Consult: diverticulitis   HPI:  Alexandria Bradley is a 62 y.o. female PMH breast cancer s/p bilateral mastectomy and chemo/radiation, HTN, HLD, hypothyroidism, palpitations who presented to [facility] today for evaluation of abdominal pain. She has a prior h/o diverticulitis in April 2024 for which she was admitted and resolved with conservative management. Her symptoms completely resolved and she followed up with Dr. Russella Dar of gastroenterology. Her last colonoscopy was in June 2021 that showed a single non-bleeding colonic angiodysplastic lesion, mild diverticulosis in the left colon, and patchy mild erythema in sigmoid colon and rectum (path: lymphocytic colitis); next planned colonoscopy June 2028.  States that this episode began about 5 days ago. Abdominal pain is mostly in the back and across lower abdomen. Associated with low grade fever up to 100.7 at home, poor appetite and diarrhea after CT contrast. Symptoms felt similar to her episode in April. She was evaluated by her PCP yesterday who obtained labs and a CT scan. Imaging consistent with acute sigmoid colon diverticulitis with micro perforation, no abscess. She reached out to GI office about results was advised to go to the ED and general surgery is asked to see.  Abdominal surgical history: tubal ligation/myomectomy, cholecystectomy Anticoagulants: none Former smoker Denies alcohol or illicit drug use  ROS: All systems reviewed and otherwise negative except for as above  Family History  Problem Relation Age of Onset   Endometrial cancer Mother 72   Hearing loss Mother    Atrial fibrillation Mother    Lung cancer Father    Brain cancer Father    Heart disease Maternal Aunt    Atrial fibrillation Maternal Aunt    Lung cancer Maternal Uncle    Atrial fibrillation Maternal Uncle    Stroke  Maternal Grandmother    Stroke Maternal Grandfather    Bladder Cancer Maternal Uncle    Multiple myeloma Maternal Uncle    Other Son        trisomy 13   Colon polyps Cousin    Colon cancer Neg Hx    Esophageal cancer Neg Hx    Rectal cancer Neg Hx    Stomach cancer Neg Hx     Past Medical History:  Diagnosis Date   Anxiety    Arthritis    Back pain    Breast cancer (HCC)    left   Fatty liver    GERD (gastroesophageal reflux disease)    Gout    Headache(784.0)    PMH : migraines   History of kidney stones    Hypercholesterolemia    Hypertension    Hypothyroidism    Osteoporosis    Personal history of chemotherapy    Personal history of radiation therapy    PONV (postoperative nausea and vomiting)    difficult to wake up   Pre-diabetes    Psoriasis    PVC's (premature ventricular contractions)    Radiation 08/18/14-10/07/14   Left breast   Sleep apnea    no CPAP    Past Surgical History:  Procedure Laterality Date   BREAST BIOPSY Left 11/10/2018   BREAST LUMPECTOMY Left 2015   BREAST LUMPECTOMY WITH AXILLARY LYMPH NODE BIOPSY  6/15   left   BREAST RECONSTRUCTION WITH PLACEMENT OF TISSUE EXPANDER AND ALLODERM Bilateral 04/12/2019   Procedure: BILATERAL BREAST RECONSTRUCTION WITH PLACEMENT OF TISSUE EXPANDERS; ALLODERM TO RIGHT CHEST;  Surgeon: Glenna Fellows,  MD;  Location: [facility];  Service: Plastics;  Laterality: Bilateral;   BREAST RECONSTRUCTION WITH PLACEMENT OF TISSUE EXPANDER AND ALLODERM Right 01/24/2020   Procedure: BREAST RECONSTRUCTION WITH PLACEMENT OF TISSUE EXPANDER AND ALLODERM;  Surgeon: Glenna Fellows, MD;  Location: [facility];  Service: Plastics;  Laterality: Right;   CARDIAC CATHETERIZATION     CHOLECYSTECTOMY     colon polyps     COLONOSCOPY N/A 06/27/2014   Procedure: COLONOSCOPY;  Surgeon: Shirley Friar, MD;  Location: [facility]COPY;  Service: Endoscopy;  Laterality: N/A;   LATISSIMUS FLAP TO BREAST Left 04/12/2019    Procedure: LEFT LATISSIMUS DORSI FLAP TO LEFT CHEST;  Surgeon: Glenna Fellows, MD;  Location: [facility];  Service: Plastics;  Laterality: Left;   LEFT HEART CATH AND CORONARY ANGIOGRAPHY N/A 07/24/2018   Procedure: LEFT HEART CATH AND CORONARY ANGIOGRAPHY;  Surgeon: Lennette Bihari, MD;  Location: [facility];  Service: Cardiovascular;  Laterality: N/A;   LIPOMA EXCISION     LIPOSUCTION WITH LIPOFILLING Bilateral 06/12/2020   Procedure: LIPOFILLING TO BILATERAL CHEST;  Surgeon: Glenna Fellows, MD;  Location: [facility];  Service: Plastics;  Laterality: Bilateral;   MASTECTOMY W/ SENTINEL NODE BIOPSY Bilateral 04/12/2019   Procedure: RIGHT BREAST RISK REDUCING MASTECTOMY; LEFT MASTECTOMY WITH LEFT AXILLARY SENTINEL LYMPH NODE BIOPSY WITH BLUE DYE INJECTION;  Surgeon: Emelia Loron, MD;  Location: [facility];  Service: General;  Laterality: Bilateral;   MYOMECTOMY     PORT-A-CATH REMOVAL N/A 02/03/2015   Procedure: REMOVAL PORT-A-CATH;  Surgeon: Emelia Loron, MD;  Location: [facility];  Service: General;  Laterality: N/A;   PORT-A-CATH REMOVAL Right 01/24/2020   Procedure: REMOVAL PORT-A-CATH;  Surgeon: Glenna Fellows, MD;  Location: [facility];  Service: Plastics;  Laterality: Right;   PORTACATH PLACEMENT N/A 03/01/2014   Procedure: INSERTION PORT-A-CATH;  Surgeon: Emelia Loron, MD;  Location: [facility];  Service: General;  Laterality: N/A;   PORTACATH PLACEMENT N/A 11/18/2018   Procedure: INSERTION PORT-A-CATH WITH ULTRASOUND;  Surgeon: Emelia Loron, MD;  [facility]: [facility];  Service: General;  Laterality: N/A;   REMOVAL OF BILATERAL TISSUE EXPANDERS WITH PLACEMENT OF BILATERAL BREAST IMPLANTS Bilateral 06/12/2020   Procedure: REMOVAL OF BILATERAL TISSUE EXPANDERS WITH PLACEMENT OF BILATERAL BREAST IMPLANTS;  Surgeon: Glenna Fellows, MD;  Location: [facility];  Service: Plastics;  Laterality: Bilateral;    REMOVAL OF TISSUE EXPANDER AND PLACEMENT OF IMPLANT Right 06/02/2019   Procedure: REMOVAL OF TISSUE EXPANDER RIGHT CHEST;  Surgeon: Glenna Fellows, MD;  Location: [facility];  Service: Plastics;  Laterality: Right;   TONSILLECTOMY     TUBAL LIGATION     WOUND DEBRIDEMENT Left 06/02/2019   Procedure: LEFT CHEST DEBRIDEMENT;  Surgeon: Glenna Fellows, MD;  Location:  SURGERY CENTER;  Service: Plastics;  Laterality: Left;    Social History:  reports that she has quit smoking. Her smoking use included cigarettes. She has a 7.5 pack-year smoking history. She has never used smokeless tobacco. She reports that she does not currently use alcohol. She reports that she does not use drugs.  Allergies:  Allergies  Allergen Reactions   Chlorhexidine Itching   Ciprofloxacin Anxiety, Other (See Comments), Rash and Swelling    GI upset   Prednisone Shortness Of Breath and Other (See Comments)    Jittery, "jacks me up"     Codeine Nausea Only   Cortisone Swelling and Other (See Comments)    "hyper sensitive,  jacks me up"    Colchicine Nausea Only    (Not in a hospital admission)   Prior to Admission medications   Medication Sig Start Date End Date Taking? Authorizing Provider  albuterol (VENTOLIN HFA) 108 (90 Base) MCG/ACT inhaler Inhale 2 puffs into the lungs every 6 (six) hours as needed for wheezing or shortness of breath (Cough). 09/24/22  Yes Theadora Rama Scales, PA-C  allopurinol (ZYLOPRIM) 100 MG tablet TAKE 1 TABLET BY MOUTH EVERY DAY 04/04/23  Yes Sharlene Dory, DO  betamethasone dipropionate 0.05 % cream Apply 1 Application topically daily. 04/07/23  Yes [provider]  diltiazem (CARDIZEM CD) 240 MG 24 hr capsule TAKE 1 CAPSULE BY MOUTH EVERY DAY 04/15/23  Yes Wendling, Jilda Roche, DO  fexofenadine (ALLEGRA) 180 MG tablet Take 180 mg by mouth daily.   Yes [provider]  Levothyroxine Sodium (TIROSINT) 150 MCG CAPS Take 150 mcg  by mouth daily before breakfast.   Yes [provider]  liothyronine (CYTOMEL) 5 MCG tablet Take 5 mcg by mouth daily before breakfast.  11/13/13  Yes [provider]  mometasone (NASONEX) 50 MCG/ACT nasal spray Place 2 sprays into the nose daily. Patient taking differently: Place 2 sprays into the nose daily as needed (allergies). 12/17/22  Yes Sharlene Dory, DO  triamterene-hydrochlorothiazide (MAXZIDE-25) 37.5-25 MG tablet Take 1 tablet by mouth daily. 05/13/22  Yes Wendall Stade, MD  amoxicillin-clavulanate (AUGMENTIN) 875-125 MG tablet Take 1 tablet by mouth 2 (two) times daily for 14 days. Patient not taking: Reported on 04/23/2023 04/23/23 05/07/23  Clayborne Dana, NP  dicyclomine (BENTYL) 10 MG capsule Take 1 capsule (10 mg total) by mouth 3 (three) times daily before meals. Patient not taking: Reported on 04/23/2023 03/12/23   Meryl Dare, MD  triamcinolone cream (KENALOG) 0.1 % Apply 1 application topically 2 (two) times daily. Patient not taking: Reported on 04/23/2023 11/26/21   Sharlene Dory, DO  prochlorperazine (COMPAZINE) 10 MG tablet Take 1 tablet (10 mg total) by mouth every 6 (six) hours as needed (Nausea or vomiting). 11/13/18 05/11/19  Magrinat, Valentino Hue, MD    Blood pressure 135/63, pulse 62, temperature 98.3 F (36.8 C), temperature source Oral, resp. rate 19, height 5\' 5"  (1.651 m), weight 76 kg, SpO2 96%. Physical Exam: General: pleasant, WD/WN female who is laying in bed in NAD HEENT: head is normocephalic, atraumatic.  Sclera are noninjected.  Pupils equal and round.  Ears and nose without any masses or lesions.  Mouth is pink and moist. Dentition fair Heart: regular, rate, and rhythm.  Normal s1,s2. No obvious murmurs, gallops, or rubs noted.  Palpable radial and pedal pulses bilaterally  Lungs: CTAB, no wheezes, rhonchi, or rales noted.  Respiratory effort nonlabored Abd: soft, TTP across lower abdomen with most pain in LLQ, no  peritonitis, ND, no masses, hernias, or organomegaly MS: no BUE/BLE edema, calves soft and nontender Skin: warm and dry with no masses, lesions, or rashes Psych: A&Ox4 with an appropriate affect Neuro: MAEs, no gross motor or sensory deficits BUE/BLE  Results for orders placed or performed during the hospital encounter of 04/23/23 (from the past 48 hour(s))  CBC with Differential     Status: None   Collection Time: 04/23/23 12:10 PM  Result Value Ref Range   WBC 9.1 4.0 - 10.5 K/uL   RBC 4.27 3.87 - 5.11 MIL/uL   Hemoglobin 12.8 12.0 - 15.0 g/dL   HCT 28.4 13.2 - 44.0 %   MCV  90.9 80.0 - 100.0 fL   MCH 30.0 26.0 - 34.0 pg   MCHC 33.0 30.0 - 36.0 g/dL   RDW 63.8 75.6 - 43.3 %   Platelets 289 150 - 400 K/uL   nRBC 0.0 0.0 - 0.2 %   Neutrophils Relative % 77 %   Neutro Abs 7.0 1.7 - 7.7 K/uL   Lymphocytes Relative 13 %   Lymphs Abs 1.1 0.7 - 4.0 K/uL   Monocytes Relative 8 %   Monocytes Absolute 0.7 0.1 - 1.0 K/uL   Eosinophils Relative 2 %   Eosinophils Absolute 0.2 0.0 - 0.5 K/uL   Basophils Relative 0 %   Basophils Absolute 0.0 0.0 - 0.1 K/uL   Immature Granulocytes 0 %   Abs Immature Granulocytes 0.03 0.00 - 0.07 K/uL    Comment: Performed at [facility], [address], [place], [place] [address]  Comprehensive metabolic panel     Status: Abnormal   Collection Time: 04/23/23 12:10 PM  Result Value Ref Range   Sodium 134 (L) 135 - 145 mmol/L   Potassium 3.1 (L) 3.5 - 5.1 mmol/L   Chloride 98 98 - 111 mmol/L   CO2 19 (L) 22 - 32 mmol/L   Glucose, Bld 102 (H) 70 - 99 mg/dL    Comment: Glucose reference range applies only to samples taken after fasting for at least 8 hours.   BUN 15 8 - 23 mg/dL   Creatinine, Ser 8.41 0.44 - 1.00 mg/dL   Calcium 9.8 8.9 - 66.0 mg/dL   Total Protein 8.3 (H) 6.5 - 8.1 g/dL   Albumin 4.4 3.5 - 5.0 g/dL   AST 19 15 - 41 U/L   ALT 27 0 - 44 U/L   Alkaline Phosphatase 84 38 - 126 U/L   Total Bilirubin 1.0 0.3 - 1.2 mg/dL    GFR, Estimated >63 >01 mL/min    Comment: (NOTE) Calculated using the CKD-EPI Creatinine Equation (2021)    Anion gap 17 (H) 5 - 15    Comment: Performed at [facility], [address], [place], [place] [address]  Lipase, blood     Status: None   Collection Time: 04/23/23 12:10 PM  Result Value Ref Range   Lipase 29 11 - 51 U/L    Comment: Performed at [facility], [address] [place], [place] [address]  Urinalysis, Routine w reflex microscopic -Urine, Clean Catch     Status: Abnormal   Collection Time: 04/23/23 12:15 PM  Result Value Ref Range   Color, Urine YELLOW YELLOW   APPearance CLEAR CLEAR   Specific Gravity, Urine 1.006 1.005 - 1.030   pH 7.0 5.0 - 8.0   Glucose, UA NEGATIVE NEGATIVE mg/dL   Hgb urine dipstick NEGATIVE NEGATIVE   Bilirubin Urine NEGATIVE NEGATIVE   Ketones, ur NEGATIVE NEGATIVE mg/dL   Protein, ur NEGATIVE NEGATIVE mg/dL   Nitrite NEGATIVE NEGATIVE   Leukocytes,Ua TRACE (A) NEGATIVE   RBC / HPF 0-5 0 - 5 RBC/hpf   WBC, UA 0-5 0 - 5 WBC/hpf   Bacteria, UA RARE (A) NONE SEEN   Squamous Epithelial / HPF 0-5 0 - 5 /HPF    Comment: Performed at [facility], [address], [place], [place] [address]  I-Stat Lactic Acid     Status: None   Collection Time: 04/23/23 12:17 PM  Result Value Ref Range   Lactic Acid, Venous 0.9 0.5 - 1.9 mmol/L   CT ABDOMEN  PELVIS W CONTRAST  Result Date: 04/22/2023 CLINICAL DATA:  Left lower quadrant pain EXAM: CT ABDOMEN AND PELVIS WITH CONTRAST TECHNIQUE: Multidetector CT imaging of the abdomen and pelvis was performed using the standard protocol following bolus administration of intravenous contrast. RADIATION DOSE REDUCTION: This exam was performed according to the departmental dose-optimization program which includes automated exposure control, adjustment of the mA and/or kV according to patient size and/or use of iterative reconstruction technique. CONTRAST:   OMNIPAQUE IOHEXOL 300 MG/ML  SOLN COMPARISON:  CT abdomen and pelvis 01/08/2023 FINDINGS: Lower chest: Twos 1 Hepatobiliary: No focal liver abnormality is seen. Status post cholecystectomy. No biliary dilatation. Pancreas: Unremarkable. No pancreatic ductal dilatation or surrounding inflammatory changes. Spleen: Normal in size without focal abnormality. Adrenals/Urinary Tract: Adrenal glands are unremarkable. Kidneys are normal, without renal calculi, focal lesion, or hydronephrosis. Bladder is unremarkable. Stomach/Bowel: There is sigmoid colon diverticulosis. There is mild wall thickening and inflammation involving the mid sigmoid colon compatible with acute diverticulitis. There are tiny foci of extraluminal gas in this region compatible with micro perforation. There is no evidence for abscess. There is no bowel obstruction. There is a large amount of stool throughout the colon. The appendix is not seen. Small bowel and stomach are within normal limits. Vascular/Lymphatic: Aortic atherosclerosis. No enlarged abdominal or pelvic lymph nodes. Reproductive: Uterus and bilateral adnexa are unremarkable. Other: No abdominal wall hernia or abnormality. No abdominopelvic ascites. Bilateral breast implants are present. Musculoskeletal: No acute or significant osseous findings. IMPRESSION: 1. Acute sigmoid colon diverticulitis with micro perforation. No abscess. Aortic Atherosclerosis (ICD10-I70.0). Electronically Signed   By: Darliss Cheney M.D.   On: 04/22/2023 20:02      Assessment/Plan Sigmoid diverticulitis with microperforation - 2nd bout - last colonoscopy 02/2020, follows with Dr. Russella Dar - CT scan 7/23 consistent with acute sigmoid colon diverticulitis with micro perforation, no abscess - WBC and lactic acid WNL. Patient is tender but has no peritonitis on exam. No indication for acute surgical intervention. Recommend medical admission, FLD, and IV zosyn. Hopefully this will resolve with conservative  management. If she does improve ultimately recommend discharge with outpatient follow up with her gastroenterologist for repeat colonoscopy. May benefit from colorectal surgeon follow up after seeing GI to discuss elective resection given this is her second bout and has complicating features. We will follow.  ID - zosyn VTE - SCDs, ok for chemical dvt ppx from surgical standpoint FEN - IVF, Ok to have FLD Foley - none  Breast cancer s/p bilateral mastectomy and chemo/radiation HTN HLD Hypothyroidism Palpitations  I reviewed ED provider notes, last 24 h vitals and pain scores, last 48 h intake and output, last 24 h labs and trends, and last 24 h imaging results.   Trixie Deis, [facility] 04/23/2023, 2:17 PM Please see Amion for pager number during day hours 7:00am-4:30pm

## 2023-04-23 NOTE — H&P (Signed)
History and Physical    Patient: Alexandria Bradley ZOX:[id number] DOB: 20-Apr-1961 DOA: 04/23/2023 DOS: the patient was seen and examined on 04/23/2023 PCP: Sharlene Dory, DO  Patient coming from: Home  Chief Complaint:  Chief Complaint  Patient presents with   Abdominal Pain   HPI: Alexandria Bradley is a 62 y.o. female with medical history significant of anxiety, osteoarthritis, back pain, left breast cancer, fatty liver, GERD, gout, migraine headaches, nephrolithiasis, lipidemia, hypertension, hypothyroidism, osteoporosis, prediabetes, psoriasis, history of PVCs sleep apnea not on CPAP, history of diverticulitis in April who presented to the emergency department with progressively worse LLQ pain since Saturday associated with low-grade fevers, night sweats, nausea, decreased appetite and an episode of diarrhea yesterday after she took oral contrast for the CT.  Her symptoms are similar to what she experienced on her previous diverticulitis episode.  No emesis, constipation, melena or hematochezia.  She denied chills, rhinorrhea, sore throat, wheezing or hemoptysis.  No chest pain, palpitations, diaphoresis, PND, orthopnea or pitting edema of the lower extremities.   No flank pain, dysuria, frequency or hematuria.  No polyuria, polydipsia, polyphagia or blurred vision.   Lab work: Her urinalysis showed trace hemoglobin and rare bacteria.  CBC, lactic acid and lipase were normal.  CMP showed a sodium 134, potassium 3.1, chloride 98 and CO2 19 mmol/L with an anion gap of 17.  Glucose was 102 mg/dL and total protein 8.3 g/dL.  The rest of the LFTs and renal function were normal.  Imaging: CT abdomen/pelvis with contrast showing sigmoid reticulosis with mild wall thickening and inflammation involving the mid sigmoid colon compatible with acute diverticulitis with R-Tannate Fox iOS drug luminal gas compatible with microperforation.  No evidence of abscess or bowel obstruction.  The appendix  was not seen.  Aortic atherosclerosis  ED course: Initial vital signs were temperature 98.3 F, pulse 81, respiration 18, BP 149/78 mmHg O2 sat 98% on room air.  The patient received hydromorphone 0.5 mg IVP, LR 1000 mL bolus and Zosyn 3.375 g IVPB x 1.   Review of Systems: As mentioned in the history of present illness. All other systems reviewed and are negative. Past Medical History:  Diagnosis Date   Anxiety    Arthritis    Back pain    Breast cancer (HCC)    left   Fatty liver    GERD (gastroesophageal reflux disease)    Gout    Headache(784.0)    PMH : migraines   History of kidney stones    Hypercholesterolemia    Hypertension    Hypothyroidism    Osteoporosis    Personal history of chemotherapy    Personal history of radiation therapy    PONV (postoperative nausea and vomiting)    difficult to wake up   Pre-diabetes    Psoriasis    PVC's (premature ventricular contractions)    Radiation 08/18/14-10/07/14   Left breast   Sleep apnea    no CPAP   Past Surgical History:  Procedure Laterality Date   BREAST BIOPSY Left 11/10/2018   BREAST LUMPECTOMY Left 2015   BREAST LUMPECTOMY WITH AXILLARY LYMPH NODE BIOPSY  6/15   left   BREAST RECONSTRUCTION WITH PLACEMENT OF TISSUE EXPANDER AND ALLODERM Bilateral 04/12/2019   Procedure: BILATERAL BREAST RECONSTRUCTION WITH PLACEMENT OF TISSUE EXPANDERS; ALLODERM TO RIGHT CHEST;  Surgeon: Glenna Fellows, MD;  Location: [facility];  Service: Plastics;  Laterality: Bilateral;   BREAST RECONSTRUCTION WITH PLACEMENT OF TISSUE EXPANDER AND ALLODERM Right 01/24/2020  Procedure: BREAST RECONSTRUCTION WITH PLACEMENT OF TISSUE EXPANDER AND ALLODERM;  Surgeon: Glenna Fellows, MD;  Location: [facility];  Service: Plastics;  Laterality: Right;   CARDIAC CATHETERIZATION     CHOLECYSTECTOMY     colon polyps     COLONOSCOPY N/A 06/27/2014   Procedure: COLONOSCOPY;  Surgeon: Shirley Friar, MD;  Location: [facility];   Service: Endoscopy;  Laterality: N/A;   LATISSIMUS FLAP TO BREAST Left 04/12/2019   Procedure: LEFT LATISSIMUS DORSI FLAP TO LEFT CHEST;  Surgeon: Glenna Fellows, MD;  Location: [facility];  Service: Plastics;  Laterality: Left;   LEFT HEART CATH AND CORONARY ANGIOGRAPHY N/A 07/24/2018   Procedure: LEFT HEART CATH AND CORONARY ANGIOGRAPHY;  Surgeon: Lennette Bihari, MD;  Location: [facility];  Service: Cardiovascular;  Laterality: N/A;   LIPOMA EXCISION     LIPOSUCTION WITH LIPOFILLING Bilateral 06/12/2020   Procedure: LIPOFILLING TO BILATERAL CHEST;  Surgeon: Glenna Fellows, MD;  Location: [facility];  Service: Plastics;  Laterality: Bilateral;   MASTECTOMY W/ SENTINEL NODE BIOPSY Bilateral 04/12/2019   Procedure: RIGHT BREAST RISK REDUCING MASTECTOMY; LEFT MASTECTOMY WITH LEFT AXILLARY SENTINEL LYMPH NODE BIOPSY WITH BLUE DYE INJECTION;  Surgeon: Emelia Loron, MD;  Location: [facility];  Service: General;  Laterality: Bilateral;   MYOMECTOMY     PORT-A-CATH REMOVAL N/A 02/03/2015   Procedure: REMOVAL PORT-A-CATH;  Surgeon: Emelia Loron, MD;  Location: [facility];  Service: General;  Laterality: N/A;   PORT-A-CATH REMOVAL Right 01/24/2020   Procedure: REMOVAL PORT-A-CATH;  Surgeon: Glenna Fellows, MD;  Location: [facility];  Service: Plastics;  Laterality: Right;   PORTACATH PLACEMENT N/A 03/01/2014   Procedure: INSERTION PORT-A-CATH;  Surgeon: Emelia Loron, MD;  Location: [facility];  Service: General;  Laterality: N/A;   PORTACATH PLACEMENT N/A 11/18/2018   Procedure: INSERTION PORT-A-CATH WITH ULTRASOUND;  Surgeon: Emelia Loron, MD;  Location: [facility];  Service: General;  Laterality: N/A;   REMOVAL OF BILATERAL TISSUE EXPANDERS WITH PLACEMENT OF BILATERAL BREAST IMPLANTS Bilateral 06/12/2020   Procedure: REMOVAL OF BILATERAL TISSUE EXPANDERS WITH PLACEMENT OF BILATERAL BREAST IMPLANTS;  Surgeon: Glenna Fellows,  MD;  Location: [facility];  Service: Plastics;  Laterality: Bilateral;   REMOVAL OF TISSUE EXPANDER AND PLACEMENT OF IMPLANT Right 06/02/2019   Procedure: REMOVAL OF TISSUE EXPANDER RIGHT CHEST;  Surgeon: Glenna Fellows, MD;  Location:  SURGERY CENTER;  Service: Plastics;  Laterality: Right;   TONSILLECTOMY     TUBAL LIGATION     WOUND DEBRIDEMENT Left 06/02/2019   Procedure: LEFT CHEST DEBRIDEMENT;  Surgeon: Glenna Fellows, MD;  Location:  SURGERY CENTER;  Service: Plastics;  Laterality: Left;   Social History:  reports that she has quit smoking. Her smoking use included cigarettes. She has a 7.5 pack-year smoking history. She has never used smokeless tobacco. She reports that she does not currently use alcohol. She reports that she does not use drugs.  Allergies  Allergen Reactions   Chlorhexidine Itching   Ciprofloxacin Anxiety, Other (See Comments), Rash and Swelling    GI upset   Prednisone Shortness Of Breath and Other (See Comments)    Jittery, "jacks me up"     Codeine Nausea Only   Cortisone Swelling and Other (See Comments)    "hyper sensitive, jacks me up"    Colchicine Nausea Only    Family History  Problem Relation Age of Onset   Endometrial cancer Mother 37   Hearing  loss Mother    Atrial fibrillation Mother    Lung cancer Father    Brain cancer Father    Heart disease Maternal Aunt    Atrial fibrillation Maternal Aunt    Lung cancer Maternal Uncle    Atrial fibrillation Maternal Uncle    Stroke Maternal Grandmother    Stroke Maternal Grandfather    Bladder Cancer Maternal Uncle    Multiple myeloma Maternal Uncle    Other Son        trisomy 13   Colon polyps Cousin    Colon cancer Neg Hx    Esophageal cancer Neg Hx    Rectal cancer Neg Hx    Stomach cancer Neg Hx     Prior to Admission medications   Medication Sig Start Date End Date Taking? Authorizing Provider  albuterol (VENTOLIN HFA) 108 (90 Base) MCG/ACT  inhaler Inhale 2 puffs into the lungs every 6 (six) hours as needed for wheezing or shortness of breath (Cough). 09/24/22  Yes Theadora Rama Scales, PA-C  allopurinol (ZYLOPRIM) 100 MG tablet TAKE 1 TABLET BY MOUTH EVERY DAY 04/04/23  Yes Sharlene Dory, DO  betamethasone dipropionate 0.05 % cream Apply 1 Application topically daily. 04/07/23  Yes [provider]  diltiazem (CARDIZEM CD) 240 MG 24 hr capsule TAKE 1 CAPSULE BY MOUTH EVERY DAY 04/15/23  Yes Wendling, Jilda Roche, DO  fexofenadine (ALLEGRA) 180 MG tablet Take 180 mg by mouth daily.   Yes [provider]  Levothyroxine Sodium (TIROSINT) 150 MCG CAPS Take 150 mcg by mouth daily before breakfast.   Yes [provider]  liothyronine (CYTOMEL) 5 MCG tablet Take 5 mcg by mouth daily before breakfast.  11/13/13  Yes [provider]  mometasone (NASONEX) 50 MCG/ACT nasal spray Place 2 sprays into the nose daily. Patient taking differently: Place 2 sprays into the nose daily as needed (allergies). 12/17/22  Yes Sharlene Dory, DO  triamterene-hydrochlorothiazide (MAXZIDE-25) 37.5-25 MG tablet Take 1 tablet by mouth daily. 05/13/22  Yes Wendall Stade, MD  amoxicillin-clavulanate (AUGMENTIN) 875-125 MG tablet Take 1 tablet by mouth 2 (two) times daily for 14 days. Patient not taking: Reported on 04/23/2023 04/23/23 05/07/23  Clayborne Dana, NP  dicyclomine (BENTYL) 10 MG capsule Take 1 capsule (10 mg total) by mouth 3 (three) times daily before meals. Patient not taking: Reported on 04/23/2023 03/12/23   Meryl Dare, MD  triamcinolone cream (KENALOG) 0.1 % Apply 1 application topically 2 (two) times daily. Patient not taking: Reported on 04/23/2023 11/26/21   Sharlene Dory, DO  prochlorperazine (COMPAZINE) 10 MG tablet Take 1 tablet (10 mg total) by mouth every 6 (six) hours as needed (Nausea or vomiting). 11/13/18 05/11/19  Magrinat, Valentino Hue, MD    Physical Exam: Vitals:   04/23/23  1134 04/23/23 1137 04/23/23 1300  BP: (!) 149/78  135/63  Pulse: 81  62  Resp: 18  19  Temp: 98.3 F (36.8 C)    TempSrc: Oral    SpO2: 98%  96%  Weight:  76 kg   Height:  5\' 5"  (1.651 m)    Physical Exam Vitals and nursing note reviewed.  Constitutional:      General: She is awake. She is not in acute distress.    Appearance: She is overweight.  HENT:     Head: Normocephalic.     Nose: No rhinorrhea.  Eyes:     General: No scleral icterus.    Pupils: Pupils are equal, round, and  reactive to light.  Neck:     Vascular: No JVD.  Cardiovascular:     Rate and Rhythm: Normal rate and regular rhythm.     Heart sounds: S1 normal and S2 normal.  Pulmonary:     Effort: Pulmonary effort is normal.     Breath sounds: Normal breath sounds.  Abdominal:     General: Bowel sounds are normal. There is no distension.     Palpations: Abdomen is soft.     Tenderness: There is abdominal tenderness in the left lower quadrant. There is no right CVA tenderness, left CVA tenderness, guarding or rebound.  Musculoskeletal:     Cervical back: Neck supple.     Right lower leg: No edema.     Left lower leg: No edema.  Skin:    General: Skin is warm and dry.  Neurological:     General: No focal deficit present.     Mental Status: She is alert and oriented to person, place, and time.  Psychiatric:        Mood and Affect: Mood normal.        Behavior: Behavior normal. Behavior is cooperative.     Data Reviewed:  Results are pending, will review when available.  Assessment and Plan: Principal Problem:   Perforation of sigmoid colon due to diverticulitis Admit to PCU/inpatient. Continue IV fluids. Continue Zosyn every 8 hours. Analgesics as needed. Antiemetics as needed. Follow-up blood culture and sensitivity Follow CBC and CMP in a.m. General surgery consult appreciated. -Will follow the recommendations.  Active Problems:   Hypokalemia Secondary to GI losses/diuretic use.. Hold  diuretic for now. Continue potassium replacement. Follow potassium level in AM.    Hyponatremia Secondary to GI losses/Maxzide use.    Metabolic acidosis, increased anion gap Minimal. No lactic acidosis. Continue IV fluids.    Hyperproteinemia Secondary to volume depletion. Check protein levels after hydration in the morning.    Hypothyroidism Continue levothyroxine 150 mcg p.o. daily. Continue liothyronine 5 mcg p.o. daily.    HLD (hyperlipidemia) Currently not on therapy. Follow-up with primary care provider for treatment options.    Essential hypertension Holding diuretic as stated above. Continue diltiazem 240 mg p.o. daily.    OSA (obstructive sleep apnea) No longer on CPAP.    Prediabetes CBG monitoring before meals and bedtime.     Advance Care Planning:   Code Status: Full Code   Consults: [facility].  Family Communication:   Severity of Illness: The appropriate patient status for this patient is INPATIENT. Inpatient status is judged to be reasonable and necessary in order to provide the required intensity of service to ensure the patient's safety. The patient's presenting symptoms, physical exam findings, and initial radiographic and laboratory data in the context of their chronic comorbidities is felt to place them at high risk for further clinical deterioration. Furthermore, it is not anticipated that the patient will be medically stable for discharge from the hospital within 2 midnights of admission.   * I certify that at the point of admission it is my clinical judgment that the patient will require inpatient hospital care spanning beyond 2 midnights from the point of admission due to high intensity of service, high risk for further deterioration and high frequency of surveillance required.*  Author: Bobette Mo, MD 04/23/2023 2:05 PM  For on call review [email or web address].   This document was prepared using Dragon voice recognition  software and may contain some unintended transcription errors.

## 2023-04-23 NOTE — Telephone Encounter (Signed)
Noted  

## 2023-04-23 NOTE — Telephone Encounter (Signed)
Patient called stated she was recently seen at the ED for Diverticulitis, and she would like to speak with a nurse to see what needs to be done for her.

## 2023-04-24 DIAGNOSIS — K5720 Diverticulitis of large intestine with perforation and abscess without bleeding: Secondary | ICD-10-CM | POA: Diagnosis not present

## 2023-04-24 LAB — COMPREHENSIVE METABOLIC PANEL
AST: 33 U/L (ref 15–41)
Albumin: 3.5 g/dL (ref 3.5–5.0)
Alkaline Phosphatase: 99 U/L (ref 38–126)
Anion gap: 11 (ref 5–15)
BUN: 11 mg/dL (ref 8–23)
CO2: 22 mmol/L (ref 22–32)
Calcium: 9.1 mg/dL (ref 8.9–10.3)
Chloride: 104 mmol/L (ref 98–111)
GFR, Estimated: >60 mL/min (ref >60)
Glucose, Bld: 93 mg/dL (ref 70–99)
Potassium: 3.6 mmol/L (ref 3.5–5.1)
Sodium: 137 mmol/L (ref 135–145)
Total Bilirubin: 0.9 mg/dL (ref 0.3–1.2)

## 2023-04-24 LAB — CULTURE, BLOOD (ROUTINE X 2)
Culture: NO GROWTH
Special Requests: ADEQUATE
Special Requests: ADEQUATE

## 2023-04-24 LAB — CBC
HCT: 38.7 % (ref 36.0–46.0)
Hemoglobin: 13 g/dL (ref 12.0–15.0)
MCH: 30.4 pg (ref 26.0–34.0)
MCHC: 33.6 g/dL (ref 30.0–36.0)
MCV: 90.4 fL (ref 80.0–100.0)
Platelets: 232 10*3/uL (ref 150–400)
RBC: 4.28 MIL/uL (ref 3.87–5.11)
RDW: 12.9 % (ref 11.5–15.5)
WBC: 6.1 10*3/uL (ref 4.0–10.5)
nRBC: 0 % (ref 0.0–0.2)

## 2023-04-24 LAB — GLUCOSE, CAPILLARY: Glucose-Capillary: 87 mg/dL (ref 70–99)

## 2023-04-24 MED ORDER — BOOST / RESOURCE BREEZE PO LIQD CUSTOM
1.0000 | Freq: Three times a day (TID) | ORAL | Status: DC
  Administered 2023-04-24: 1 via ORAL

## 2023-04-24 MED ORDER — TRAMADOL HCL 50 MG PO TABS: 50.0000 mg | ORAL_TABLET | Freq: Four times a day (QID) | ORAL | 0 refills | Status: DC | PRN

## 2023-04-24 MED ORDER — MOMETASONE FUROATE 50 MCG/ACT NA SUSP: 2.0000 | Freq: Every day | NASAL | Status: DC | PRN

## 2023-04-24 MED ORDER — ONDANSETRON HCL 4 MG PO TABS: 4.0000 mg | ORAL_TABLET | Freq: Four times a day (QID) | ORAL | 0 refills | Status: DC | PRN

## 2023-04-24 MED ORDER — AMOXICILLIN-POT CLAVULANATE 875-125 MG PO TABS
1.0000 | ORAL_TABLET | Freq: Two times a day (BID) | ORAL | Status: DC
  Administered 2023-04-24: 1 via ORAL
  Filled 2023-04-24: qty 1

## 2023-04-24 NOTE — Progress Notes (Signed)
   04/24/23 1337  TOC Brief Assessment  Insurance and Status Reviewed  Patient has primary care physician Yes  Home environment has been reviewed Home w/ spouse  Prior level of function: Independent  Prior/Current Home Services No current home services  Social Determinants of Health Reivew SDOH reviewed no interventions necessary  Readmission risk has been reviewed Yes  Transition of care needs no transition of care needs at this time

## 2023-04-24 NOTE — Progress Notes (Signed)
PROGRESS NOTE    Alexandria Bradley  WGN:[id number] DOB: [birth year] DOA: 04/23/2023 PCP: Sharlene Dory, DO   Brief Narrative:   62 y.o. female with medical history significant of anxiety, osteoarthritis, left breast cancer, fatty liver, GERD, gout, migraine headaches, nephrolithiasis, hyperlipidemia, hypertension, hypothyroidism, osteoporosis, prediabetes, psoriasis, history of PVCs, sleep apnea not on CPAP, history of diverticulitis in April presented with worsening abdominal pain, low-grade fevers, nausea.  On presentation, CT of abdomen and pelvis with contrast showed findings suggestive of acute sigmoid diverticulitis with microperforation but no abscess.  She was started on IV fluids and antibiotics.  General surgery was consulted.  Assessment & Plan:   Acute sigmoid diverticulitis with microperforation -General surgery following.  Currently on IV fluids and IV antibiotics. -Diet advancement as per general surgery. -Follow-up blood cultures  Hypokalemia -Improved  Hyponatremia -Improved  Acute metabolic acidosis -Improved  Hypothyroidism -Continue levothyroxine and liothyronine  Hyperlipidemia -Currently not on therapy.  Outpatient follow-up with PCP  Essential hypertension -Blood pressure stable.  Continue Cardizem.  Diuretic on hold  OSA -No longer on CPAP.  Outpatient follow-up with PCP  Prediabetes -A1c 5.7.  Blood sugars controlled.  History of gout -Continue allopurinol   DVT prophylaxis: Lovenox Code Status: Full Family Communication: None at bedside Disposition Plan: Status is: Inpatient Remains inpatient appropriate because: Of severity of illness    Consultants: General surgery  Procedures: None  Antimicrobials: Zosyn from 04/23/2023 onwards   Subjective: Patient seen and examined at bedside.  Still complains of intermittent lower abdominal pain but improving.  Tolerating full liquid diet.  No fever, shortness of breath or chest  pain reported.  Objective: Vitals:   04/23/23 1556 04/23/23 1822 04/23/23 2234 04/24/23 0539  BP: 120/62 122/79 127/60 135/61  Pulse: (!) 56 (!) 58 (!) 56 (!) 55  Resp: 13 16 18 20   Temp: 98 F (36.7 C) 98.2 F (36.8 C) 98.2 F (36.8 C) (!) 97.5 F (36.4 C)  TempSrc: Oral Oral Oral Oral  SpO2: 97% 100% 98% 100%  Weight:      Height:        Intake/Output Summary (Last 24 hours) at 04/24/2023 0723 Last data filed at 04/24/2023 0440 Gross per 24 hour  Intake 1298.82 ml  Output --  Net 1298.82 ml   Filed Weights   04/23/23 1137  Weight: 76 kg    Examination:  General exam: Appears calm and comfortable.  On room air. Respiratory system: Bilateral decreased breath sounds at bases Cardiovascular system: S1 & S2 heard, mild intermittent bradycardia present gastrointestinal system: Abdomen is nondistended, soft and mildly tender in the lower quadrant. Normal bowel sounds heard. Extremities: No cyanosis, clubbing, edema  Central nervous system: Alert and oriented. No focal neurological deficits. Moving extremities Skin: No rashes, lesions or ulcers Psychiatry: Judgement and insight appear normal. Mood & affect appropriate.     Data Reviewed: I have personally reviewed following labs and imaging studies  CBC: Recent Labs  Lab 04/22/23 1007 04/23/23 1210 04/24/23 0512  WBC 14.3* 9.1 6.1  NEUTROABS 10.6* 7.0  --   HGB 13.0 12.8 13.0  HCT 40.5 38.8 38.7  MCV 91.6 90.9 90.4  PLT 301.0 289 232   Basic Metabolic Panel: Recent Labs  Lab 04/22/23 1007 04/23/23 1210 04/24/23 0512  NA 136 134* 137  K 3.3* 3.1* 3.6  CL 100 98 104  CO2 24 19* 22  GLUCOSE 115* 102* 93  BUN 14 15 11   CREATININE 0.78 0.77 0.69  CALCIUM 10.1 9.8  9.1  MG  --  2.3  --   PHOS  --  2.7  --    GFR: Estimated Creatinine Clearance: 72.5 mL/min (by C-G formula based on SCr of 0.69 mg/dL). Liver Function Tests: Recent Labs  Lab 04/22/23 1007 04/23/23 1210 04/24/23 0512  AST 19 19 33  ALT  24 27 44  ALKPHOS 79 84 99  BILITOT 1.0 1.0 0.9  PROT 7.9 8.3* 6.9  ALBUMIN 4.5 4.4 3.5   Recent Labs  Lab 04/23/23 1210  LIPASE 29   No results for input(s): "AMMONIA" in the last 168 hours. Coagulation Profile: No results for input(s): "INR", "PROTIME" in the last 168 hours. Cardiac Enzymes: No results for input(s): "CKTOTAL", "CKMB", "CKMBINDEX", "TROPONINI" in the last 168 hours. BNP (last 3 results) No results for input(s): "PROBNP" in the last 8760 hours. HbA1C: Recent Labs    04/23/23 1210  HGBA1C 5.7*   CBG: Recent Labs  Lab 04/23/23 2141  GLUCAP 114*   Lipid Profile: No results for input(s): "CHOL", "HDL", "LDLCALC", "TRIG", "CHOLHDL", "LDLDIRECT" in the last 72 hours. Thyroid Function Tests: No results for input(s): "TSH", "T4TOTAL", "FREET4", "T3FREE", "THYROIDAB" in the last 72 hours. Anemia Panel: No results for input(s): "VITAMINB12", "FOLATE", "FERRITIN", "TIBC", "IRON", "RETICCTPCT" in the last 72 hours. Sepsis Labs: Recent Labs  Lab 04/23/23 1217  LATICACIDVEN 0.9    Recent Results (from the past 240 hour(s))  Urine Culture     Status: None   Collection Time: 04/22/23 10:07 AM   Specimen: Urine  Result Value Ref Range Status   MICRO NUMBER: [id number]  Final   SPECIMEN QUALITY: Adequate  Final   Sample Source NOT GIVEN  Final   STATUS: FINAL  Final   Result: No Growth  Final  Blood culture (routine x 2)     Status: None (Preliminary result)   Collection Time: 04/23/23 12:10 PM   Specimen: BLOOD RIGHT FOREARM  Result Value Ref Range Status   Specimen Description   Final    BLOOD RIGHT FOREARM Performed at [facility], [address] [place], [place] [address]    Special Requests   Final    BOTTLES DRAWN AEROBIC AND ANAEROBIC Blood Culture adequate volume Performed at [facility], [address], [place], [place] [address]    Culture PENDING  Incomplete   Report Status PENDING  Incomplete          Radiology Studies: CT ABDOMEN PELVIS W CONTRAST  Result Date: 04/22/2023 CLINICAL DATA:  Left lower quadrant pain EXAM: CT ABDOMEN AND PELVIS WITH CONTRAST TECHNIQUE: Multidetector CT imaging of the abdomen and pelvis was performed using the standard protocol following bolus administration of intravenous contrast. RADIATION DOSE REDUCTION: This exam was performed according to the departmental dose-optimization program which includes automated exposure control, adjustment of the mA and/or kV according to patient size and/or use of iterative reconstruction technique. CONTRAST:  OMNIPAQUE IOHEXOL 300 MG/ML  SOLN COMPARISON:  CT abdomen and pelvis 01/08/2023 FINDINGS: Lower chest: Twos 1 Hepatobiliary: No focal liver abnormality is seen. Status post cholecystectomy. No biliary dilatation. Pancreas: Unremarkable. No pancreatic ductal dilatation or surrounding inflammatory changes. Spleen: Normal in size without focal abnormality. Adrenals/Urinary Tract: Adrenal glands are unremarkable. Kidneys are normal, without renal calculi, focal lesion, or hydronephrosis. Bladder is unremarkable. Stomach/Bowel: There is sigmoid colon diverticulosis. There is mild wall thickening and inflammation involving the mid sigmoid colon compatible with acute diverticulitis. There are tiny foci of extraluminal gas in this region  compatible with micro perforation. There is no evidence for abscess. There is no bowel obstruction. There is a large amount of stool throughout the colon. The appendix is not seen. Small bowel and stomach are within normal limits. Vascular/Lymphatic: Aortic atherosclerosis. No enlarged abdominal or pelvic lymph nodes. Reproductive: Uterus and bilateral adnexa are unremarkable. Other: No abdominal wall hernia or abnormality. No abdominopelvic ascites. Bilateral breast implants are present. Musculoskeletal: No acute or significant osseous findings. IMPRESSION: 1. Acute sigmoid colon diverticulitis  with micro perforation. No abscess. Aortic Atherosclerosis (ICD10-I70.0). Electronically Signed   By: Darliss Cheney M.D.   On: 04/22/2023 20:02        Scheduled Meds:  allopurinol  100 mg Oral Daily   diltiazem  240 mg Oral Daily   enoxaparin (LOVENOX) injection  40 mg Subcutaneous Q24H   levothyroxine  150 mcg Oral QAC breakfast   liothyronine  5 mcg Oral QAC breakfast   loratadine  10 mg Oral Daily   pantoprazole (PROTONIX) IV  40 mg Intravenous Daily   Continuous Infusions:  0.9 % NaCl with KCl 20 mEq / L 100 mL/hr at 04/24/23 0541   piperacillin-tazobactam (ZOSYN)  IV 3.375 g (04/24/23 0542)          Glade Lloyd, MD [facility] 04/24/2023, 7:23 AM

## 2023-04-24 NOTE — Progress Notes (Signed)
pt had soft diet. no nausea or vomiting. some abd pain. she had diarrhea while eating food. possibly related to oral antibiotic. MD notified. Per MD, if she still feels ok to go home, she can go. Pt stated she feels okay enough to go home. Pt to be discharged.

## 2023-04-24 NOTE — Care Management CC44 (Signed)
Condition Code 44 Documentation Completed  Patient Details  Name: NIKESHIA KEETCH MRN: [id number] Date of Birth: 11/20/1960   Condition Code 44 given:  Yes Patient signature on Condition Code 44 notice:  Yes Documentation of 2 MD's agreement:  Yes Code 44 added to claim:  Yes    Otelia Santee, LCSW 04/24/2023, 1:28 PM

## 2023-04-24 NOTE — Telephone Encounter (Signed)
Inbound call from patient stating she returned home today 7/25 from the ED. Stated she spoke with [facility] and was advised that she needs to be seen in 3-4 weeks for a follow up and then schedule a colonoscopy. After colonoscopy [facility] would like for patient to schedule with them for another procedure. Patient requesting a call back to discuss further. Please advise, thank you.

## 2023-04-24 NOTE — Progress Notes (Signed)
Patient ID: Alexandria Bradley, female   DOB: May 25, 1961, 62 y.o.   MRN: [id number] [facility] Surgery Progress Note     Subjective: CC-  Feeling better than yesterday. Still has some LLQ pain but it is less. Not much of an appetite but tolerating liquids without n/v. Passing flatus. WBC WNL, VSS  Objective: Vital signs in last 24 hours: Temp:  [97.5 F (36.4 C)-98.3 F (36.8 C)] 97.5 F (36.4 C) (07/25 0539) Pulse Rate:  [55-81] 55 (07/25 0539) Resp:  [13-20] 20 (07/25 0539) BP: (120-149)/(60-79) 135/61 (07/25 0539) SpO2:  [94 %-100 %] 100 % (07/25 0539) Weight:  [76 kg] 76 kg (07/24 1137) Last BM Date : 04/23/23  Intake/Output from previous day: 07/24 0701 - 07/25 0700 In: 1298.8 [I.V.:3.2; IV Piggyback:1295.7] Out: -  Intake/Output this shift: Total I/O In: 120 [P.O.:120] Out: -   PE: Gen:  Alert, NAD, pleasant Pulm:  rate and effort normal Abd: Soft, ND, mild focal LLQ TTP without rebound or guarding  Lab Results:  Recent Labs    04/23/23 1210 04/24/23 0512  WBC 9.1 6.1  HGB 12.8 13.0  HCT 38.8 38.7  PLT 289 232   BMET Recent Labs    04/23/23 1210 04/24/23 0512  NA 134* 137  K 3.1* 3.6  CL 98 104  CO2 19* 22  GLUCOSE 102* 93  BUN 15 11  CREATININE 0.77 0.69  CALCIUM 9.8 9.1   PT/INR No results for input(s): "LABPROT", "INR" in the last 72 hours. CMP     Component Value Date/Time   NA 137 04/24/2023 0512   NA 138 01/09/2021 0855   NA 140 06/19/2017 1312   K 3.6 04/24/2023 0512   K 3.5 06/19/2017 1312   CL 104 04/24/2023 0512   CO2 22 04/24/2023 0512   CO2 25 06/19/2017 1312   GLUCOSE 93 04/24/2023 0512   GLUCOSE 82 06/19/2017 1312   GLUCOSE 88 09/22/2006 1056   BUN 11 04/24/2023 0512   BUN 13 01/09/2021 0855   BUN 18.4 06/19/2017 1312   CREATININE 0.69 04/24/2023 0512   CREATININE 0.83 08/08/2022 0909   CREATININE 0.8 06/19/2017 1312   CALCIUM 9.1 04/24/2023 0512   CALCIUM 10.2 06/19/2017 1312   PROT 6.9 04/24/2023 0512    PROT 7.2 03/15/2022 1004   PROT 7.7 06/19/2017 1312   ALBUMIN 3.5 04/24/2023 0512   ALBUMIN 4.6 03/15/2022 1004   ALBUMIN 4.4 06/19/2017 1312   AST 33 04/24/2023 0512   AST 17 08/08/2022 0909   AST 15 06/19/2017 1312   ALT 44 04/24/2023 0512   ALT 21 08/08/2022 0909   ALT 22 06/19/2017 1312   ALKPHOS 99 04/24/2023 0512   ALKPHOS 86 06/19/2017 1312   BILITOT 0.9 04/24/2023 0512   BILITOT 0.6 08/08/2022 0909   BILITOT 0.26 06/19/2017 1312   GFRNONAA >60 04/24/2023 0512   GFRNONAA >60 08/08/2022 0909   GFRAA >60 06/08/2020 1155   GFRAA >60 06/09/2019 1326   Lipase     Component Value Date/Time   LIPASE 29 04/23/2023 1210       Studies/Results: CT ABDOMEN PELVIS W CONTRAST  Result Date: 04/22/2023 CLINICAL DATA:  Left lower quadrant pain EXAM: CT ABDOMEN AND PELVIS WITH CONTRAST TECHNIQUE: Multidetector CT imaging of the abdomen and pelvis was performed using the standard protocol following bolus administration of intravenous contrast. RADIATION DOSE REDUCTION: This exam was performed according to the departmental dose-optimization program which includes automated exposure control, adjustment of the mA and/or kV according  to patient size and/or use of iterative reconstruction technique. CONTRAST:  OMNIPAQUE IOHEXOL 300 MG/ML  SOLN COMPARISON:  CT abdomen and pelvis 01/08/2023 FINDINGS: Lower chest: Twos 1 Hepatobiliary: No focal liver abnormality is seen. Status post cholecystectomy. No biliary dilatation. Pancreas: Unremarkable. No pancreatic ductal dilatation or surrounding inflammatory changes. Spleen: Normal in size without focal abnormality. Adrenals/Urinary Tract: Adrenal glands are unremarkable. Kidneys are normal, without renal calculi, focal lesion, or hydronephrosis. Bladder is unremarkable. Stomach/Bowel: There is sigmoid colon diverticulosis. There is mild wall thickening and inflammation involving the mid sigmoid colon compatible with acute diverticulitis. There are  tiny foci of extraluminal gas in this region compatible with micro perforation. There is no evidence for abscess. There is no bowel obstruction. There is a large amount of stool throughout the colon. The appendix is not seen. Small bowel and stomach are within normal limits. Vascular/Lymphatic: Aortic atherosclerosis. No enlarged abdominal or pelvic lymph nodes. Reproductive: Uterus and bilateral adnexa are unremarkable. Other: No abdominal wall hernia or abnormality. No abdominopelvic ascites. Bilateral breast implants are present. Musculoskeletal: No acute or significant osseous findings. IMPRESSION: 1. Acute sigmoid colon diverticulitis with micro perforation. No abscess. Aortic Atherosclerosis (ICD10-I70.0). Electronically Signed   By: Darliss Cheney M.D.   On: 04/22/2023 20:02    Anti-infectives: Anti-infectives (From admission, onward)    Start     Dose/Rate Route Frequency Ordered Stop   04/24/23 1045  amoxicillin-clavulanate (AUGMENTIN) 875-125 MG per tablet 1 tablet        1 tablet Oral Every 12 hours 04/24/23 0951     04/23/23 2000  piperacillin-tazobactam (ZOSYN) IVPB 3.375 g  Status:  Discontinued        3.375 g 12.5 mL/hr over 240 Minutes Intravenous Every 8 hours 04/23/23 1416 04/24/23 0951   04/23/23 1215  piperacillin-tazobactam (ZOSYN) IVPB 3.375 g        3.375 g 100 mL/hr over 30 Minutes Intravenous  Once 04/23/23 1206 04/23/23 1402        Assessment/Plan Sigmoid diverticulitis with microperforation - 2nd bout - last colonoscopy 02/2020, follows with Dr. Russella Dar - CT scan 7/23 consistent with acute sigmoid colon diverticulitis with micro perforation, no abscess - WBC WNL, VSS. Mild LLQ tenderness on exam but no peritonitis and pain is improving. Advance to soft diet and transition to oral augmentin. If patient tolerates this she would be ok for discharge later today on oral antibiotics from surgical standpoint. Follow up as scheduled with gastroenterologist. Discussed  possibility of following up with a colorectal surgeon as well after seeing GI/colonoscopy to discuss elective resection given this is her second bout and has complicating features. Plan discussed with primary team.   ID - zosyn VTE - SCDs, lovenox FEN - IVF, soft diet, Boost Foley - none   Breast cancer s/p bilateral mastectomy and chemo/radiation HTN HLD Hypothyroidism Palpitations  I reviewed hospitalist notes, last 24 h vitals and pain scores, last 48 h intake and output, and last 24 h labs and trends.    LOS: 1 day    Franne Forts, [facility] 04/24/2023, 10:02 AM Please see Amion for pager number during day hours 7:00am-4:30pm

## 2023-04-24 NOTE — Discharge Summary (Addendum)
Physician Discharge Summary  KELA BACCARI WUJ:[id number] DOB: July 12, 1961 DOA: 04/23/2023  PCP: Sharlene Dory, DO  Admit date: 04/23/2023 Discharge date: 04/24/2023  Admitted From: Home Disposition: Home  Recommendations for Outpatient Follow-up:  Follow up with PCP in 1 week with repeat CBC/BMP Outpatient follow-up with GI and general surgery Follow up in ED if symptoms worsen or new appear   Home Health: No Equipment/Devices: None  Discharge Condition: Stable CODE STATUS: Full Diet recommendation: Heart healthy/soft diet  Brief/Interim Summary: 62 y.o. female with medical history significant of anxiety, osteoarthritis, left breast cancer, fatty liver, GERD, gout, migraine headaches, nephrolithiasis, hyperlipidemia, hypertension, hypothyroidism, osteoporosis, prediabetes, psoriasis, history of PVCs, sleep apnea not on CPAP, history of diverticulitis in April presented with worsening abdominal pain, low-grade fevers, nausea.  On presentation, CT of abdomen and pelvis with contrast showed findings suggestive of acute sigmoid diverticulitis with microperforation but no abscess.  She was started on IV fluids and antibiotics.  General surgery was consulted.  During the hospitalization, her condition is improved.  Her pain is improving.  She is tolerating diet.  General surgery has cleared the patient for discharge on oral Augmentin.  Discharge patient home today.  Outpatient follow-up with PCP/GI/general surgery.    Discharge Diagnoses:   Acute sigmoid diverticulitis with microperforation -General surgery following.  Currently on IV fluids and IV Zosyn. -Diet being advanced by general surgery today. -Cultures have been negative so far -During the hospitalization, her condition has improved.  Her pain is improving.  She is tolerating diet.  General surgery has cleared the patient for discharge home today on oral Augmentin if she tolerates soft diet.  Discharge patient home  today.  Outpatient follow-up with PCP/GI/general surgery.  Will need outpatient colonoscopy in 6 to 8 weeks.   Hypokalemia -Improved   Hyponatremia -Improved   Acute metabolic acidosis -Improved   Hypothyroidism -Continue levothyroxine and liothyronine   Hyperlipidemia -Currently not on therapy.  Outpatient follow-up with PCP   Essential hypertension -Blood pressure stable.  Continue home regimen.  Outpatient follow-up with PCP.  OSA -No longer on CPAP.  Outpatient follow-up with PCP   Prediabetes -A1c 5.7.  Blood sugars controlled.   History of gout -Continue allopurinol  Discharge Instructions  Discharge Instructions     Ambulatory referral to Gastroenterology   Complete by: As directed    Hospital followup   What is the reason for referral?: Other      Allergies as of 04/24/2023       Reactions   Chlorhexidine Itching   Ciprofloxacin Anxiety, Other (See Comments), Rash, Swelling   GI upset   Prednisone Shortness Of Breath, Other (See Comments)   Jittery, "jacks me up"    Codeine Nausea Only   Cortisone Swelling, Other (See Comments)   "hyper sensitive, jacks me up"   Colchicine Nausea Only        Medication List     STOP taking these medications    dicyclomine 10 MG capsule Commonly known as: BENTYL   triamcinolone cream 0.1 % Commonly known as: KENALOG       TAKE these medications    albuterol 108 (90 Base) MCG/ACT inhaler Commonly known as: VENTOLIN HFA Inhale 2 puffs into the lungs every 6 (six) hours as needed for wheezing or shortness of breath (Cough).   allopurinol 100 MG tablet Commonly known as: ZYLOPRIM TAKE 1 TABLET BY MOUTH EVERY DAY   amoxicillin-clavulanate 875-125 MG tablet Commonly known as: AUGMENTIN Take 1 tablet by mouth 2 (  two) times daily for 14 days.   betamethasone dipropionate 0.05 % cream Apply 1 Application topically daily.   diltiazem 240 MG 24 hr capsule Commonly known as: CARDIZEM CD TAKE 1 CAPSULE  BY MOUTH EVERY DAY   fexofenadine 180 MG tablet Commonly known as: ALLEGRA Take 180 mg by mouth daily.   liothyronine 5 MCG tablet Commonly known as: CYTOMEL Take 5 mcg by mouth daily before breakfast.   mometasone 50 MCG/ACT nasal spray Commonly known as: Nasonex Place 2 sprays into the nose daily as needed (allergies).   ondansetron 4 MG tablet Commonly known as: ZOFRAN Take 1 tablet (4 mg total) by mouth every 6 (six) hours as needed for nausea.   Tirosint 150 MCG Caps Generic drug: Levothyroxine Sodium Take 150 mcg by mouth daily before breakfast.   traMADol 50 MG tablet Commonly known as: ULTRAM Take 1 tablet (50 mg total) by mouth every 6 (six) hours as needed for moderate pain.   triamterene-hydrochlorothiazide 37.5-25 MG tablet Commonly known as: MAXZIDE-25 Take 1 tablet by mouth daily.        Follow-up Information     Meryl Dare, MD. Go to.   Specialty: Gastroenterology Contact information: [address] [place] [address] [phone]         Surgery, Nellie. Call.   Specialty: General Surgery Why: Follow up with colorectal surgeon (Dr. Michaell Cowing, Dr. Cliffton Asters, Dr. Maisie Fus) to discuss elective surgery after you have seen you gastroenterologist Contact information: [address] [place] [address] [phone]                Allergies  Allergen Reactions   Chlorhexidine Itching   Ciprofloxacin Anxiety, Other (See Comments), Rash and Swelling    GI upset   Prednisone Shortness Of Breath and Other (See Comments)    Jittery, "jacks me up"     Codeine Nausea Only   Cortisone Swelling and Other (See Comments)    "hyper sensitive, jacks me up"    Colchicine Nausea Only    Consultations: General surgery   Procedures/Studies: CT ABDOMEN PELVIS W CONTRAST  Result Date: 04/22/2023 CLINICAL DATA:  Left lower quadrant pain EXAM: CT ABDOMEN AND PELVIS WITH CONTRAST TECHNIQUE: Multidetector CT imaging of the  abdomen and pelvis was performed using the standard protocol following bolus administration of intravenous contrast. RADIATION DOSE REDUCTION: This exam was performed according to the departmental dose-optimization program which includes automated exposure control, adjustment of the mA and/or kV according to patient size and/or use of iterative reconstruction technique. CONTRAST:  OMNIPAQUE IOHEXOL 300 MG/ML  SOLN COMPARISON:  CT abdomen and pelvis 01/08/2023 FINDINGS: Lower chest: Twos 1 Hepatobiliary: No focal liver abnormality is seen. Status post cholecystectomy. No biliary dilatation. Pancreas: Unremarkable. No pancreatic ductal dilatation or surrounding inflammatory changes. Spleen: Normal in size without focal abnormality. Adrenals/Urinary Tract: Adrenal glands are unremarkable. Kidneys are normal, without renal calculi, focal lesion, or hydronephrosis. Bladder is unremarkable. Stomach/Bowel: There is sigmoid colon diverticulosis. There is mild wall thickening and inflammation involving the mid sigmoid colon compatible with acute diverticulitis. There are tiny foci of extraluminal gas in this region compatible with micro perforation. There is no evidence for abscess. There is no bowel obstruction. There is a large amount of stool throughout the colon. The appendix is not seen. Small bowel and stomach are within normal limits. Vascular/Lymphatic: Aortic atherosclerosis. No enlarged abdominal or pelvic lymph nodes. Reproductive: Uterus and bilateral adnexa are unremarkable. Other: No abdominal wall hernia or  abnormality. No abdominopelvic ascites. Bilateral breast implants are present. Musculoskeletal: No acute or significant osseous findings. IMPRESSION: 1. Acute sigmoid colon diverticulitis with micro perforation. No abscess. Aortic Atherosclerosis (ICD10-I70.0). Electronically Signed   By: Darliss Cheney M.D.   On: 04/22/2023 20:02      Subjective: Patient seen and examined at bedside.  Feels much  better but still having intermittent lower abdominal pain.  Wants to go home today.  Tolerating full liquid diet.  No fever, shortness of breath or chest pain reported.   Discharge Exam: Vitals:   04/23/23 2234 04/24/23 0539  BP: 127/60 135/61  Pulse: (!) 56 (!) 55  Resp: 18 20  Temp: 98.2 F (36.8 C) (!) 97.5 F (36.4 C)  SpO2: 98% 100%    General: Pt is alert, awake, not in acute distress Cardiovascular: rate controlled, S1/S2 + Respiratory: bilateral decreased breath sounds at bases Abdominal: Soft, mildly tender in the lower quadrant,, ND, bowel sounds + Extremities: no edema, no cyanosis    The results of significant diagnostics from this hospitalization (including imaging, microbiology, ancillary and laboratory) are listed below for reference.     Microbiology: Recent Results (from the past 240 hour(s))  Urine Culture     Status: None   Collection Time: 04/22/23 10:07 AM   Specimen: Urine  Result Value Ref Range Status   MICRO NUMBER: [id number]  Final   SPECIMEN QUALITY: Adequate  Final   Sample Source NOT GIVEN  Final   STATUS: FINAL  Final   Result: No Growth  Final  Blood culture (routine x 2)     Status: None (Preliminary result)   Collection Time: 04/23/23 12:10 PM   Specimen: BLOOD RIGHT FOREARM  Result Value Ref Range Status   Specimen Description   Final    BLOOD RIGHT FOREARM Performed at [facility], [address] [place], [place] [address]    Special Requests   Final    BOTTLES DRAWN AEROBIC AND ANAEROBIC Blood Culture adequate volume Performed at [facility], [address] [place], [place] [address]    Culture   Final    NO GROWTH < 24 HOURS Performed at [facility], [address] [place], [place] [address]    Report Status PENDING  Incomplete  Blood culture (routine x 2)     Status: None (Preliminary result)   Collection Time: 04/23/23 12:30 PM   Specimen: BLOOD  Result Value Ref Range Status   Specimen  Description   Final    BLOOD RIGHT ANTECUBITAL Performed at [facility], [address] [place], [place] [address]    Special Requests   Final    BOTTLES DRAWN AEROBIC AND ANAEROBIC Blood Culture adequate volume Performed at [facility], [address], [place], [place] [address]    Culture   Final    NO GROWTH < 24 HOURS Performed at [facility], [address] [place], [place] [address]    Report Status PENDING  Incomplete     Labs: BNP (last 3 results) No results for input(s): "BNP" in the last 8760 hours. Basic Metabolic Panel: Recent Labs  Lab 04/22/23 1007 04/23/23 1210 04/24/23 0512  NA 136 134* 137  K 3.3* 3.1* 3.6  CL 100 98 104  CO2 24 19* 22  GLUCOSE 115* 102* 93  BUN 14 15 11   CREATININE 0.78 0.77 0.69  CALCIUM 10.1 9.8 9.1  MG  --  2.3  --  PHOS  --  2.7  --    Liver Function Tests: Recent Labs  Lab 04/22/23 1007 04/23/23 1210 04/24/23 0512  AST 19 19 33  ALT 24 27 44  ALKPHOS 79 84 99  BILITOT 1.0 1.0 0.9  PROT 7.9 8.3* 6.9  ALBUMIN 4.5 4.4 3.5   Recent Labs  Lab 04/23/23 1210  LIPASE 29   No results for input(s): "AMMONIA" in the last 168 hours. CBC: Recent Labs  Lab 04/22/23 1007 04/23/23 1210 04/24/23 0512  WBC 14.3* 9.1 6.1  NEUTROABS 10.6* 7.0  --   HGB 13.0 12.8 13.0  HCT 40.5 38.8 38.7  MCV 91.6 90.9 90.4  PLT 301.0 289 232   Cardiac Enzymes: No results for input(s): "CKTOTAL", "CKMB", "CKMBINDEX", "TROPONINI" in the last 168 hours. BNP: Invalid input(s): "POCBNP" CBG: Recent Labs  Lab 04/23/23 2141 04/24/23 0736  GLUCAP 114* 87   D-Dimer No results for input(s): "DDIMER" in the last 72 hours. Hgb A1c Recent Labs    04/23/23 1210  HGBA1C 5.7*   Lipid Profile No results for input(s): "CHOL", "HDL", "LDLCALC", "TRIG", "CHOLHDL", "LDLDIRECT" in the last 72 hours. Thyroid function studies No results for input(s): "TSH", "T4TOTAL", "T3FREE", "THYROIDAB" in the  last 72 hours.  Invalid input(s): "FREET3" Anemia work up No results for input(s): "VITAMINB12", "FOLATE", "FERRITIN", "TIBC", "IRON", "RETICCTPCT" in the last 72 hours. Urinalysis    Component Value Date/Time   COLORURINE YELLOW 04/23/2023 1215   APPEARANCEUR CLEAR 04/23/2023 1215   LABSPEC 1.006 04/23/2023 1215   LABSPEC 1.020 06/15/2014 1101   PHURINE 7.0 04/23/2023 1215   GLUCOSEU NEGATIVE 04/23/2023 1215   GLUCOSEU Negative 06/15/2014 1101   HGBUR NEGATIVE 04/23/2023 1215   BILIRUBINUR NEGATIVE 04/23/2023 1215   BILIRUBINUR moderate 04/22/2023 1017   BILIRUBINUR Color Interference 06/15/2014 1101   KETONESUR NEGATIVE 04/23/2023 1215   PROTEINUR NEGATIVE 04/23/2023 1215   UROBILINOGEN 0.2 04/22/2023 1017   UROBILINOGEN 0.2 06/15/2014 1101   NITRITE NEGATIVE 04/23/2023 1215   LEUKOCYTESUR TRACE (A) 04/23/2023 1215   LEUKOCYTESUR Trace 06/15/2014 1101   Sepsis Labs Recent Labs  Lab 04/22/23 1007 04/23/23 1210 04/24/23 0512  WBC 14.3* 9.1 6.1   Microbiology Recent Results (from the past 240 hour(s))  Urine Culture     Status: None   Collection Time: 04/22/23 10:07 AM   Specimen: Urine  Result Value Ref Range Status   MICRO NUMBER: [id number]  Final   SPECIMEN QUALITY: Adequate  Final   Sample Source NOT GIVEN  Final   STATUS: FINAL  Final   Result: No Growth  Final  Blood culture (routine x 2)     Status: None (Preliminary result)   Collection Time: 04/23/23 12:10 PM   Specimen: BLOOD RIGHT FOREARM  Result Value Ref Range Status   Specimen Description   Final    BLOOD RIGHT FOREARM Performed at [facility], [address] [place], [place] [address]    Special Requests   Final    BOTTLES DRAWN AEROBIC AND ANAEROBIC Blood Culture adequate volume Performed at [facility], [address] [place], [place] [address]    Culture   Final    NO GROWTH < 24 HOURS Performed at [facility], [address] [place], [place] [address]     Report Status PENDING  Incomplete  Blood culture (routine x 2)     Status: None (Preliminary result)   Collection Time: 04/23/23 12:30 PM   Specimen: BLOOD  Result Value  Ref Range Status   Specimen Description   Final    BLOOD RIGHT ANTECUBITAL Performed at [facility], [address], [place], [place] [address]    Special Requests   Final    BOTTLES DRAWN AEROBIC AND ANAEROBIC Blood Culture adequate volume Performed at [facility], [address], [place], [place] [address]    Culture   Final    NO GROWTH < 24 HOURS Performed at [facility], [address], [place], [place] [address]    Report Status PENDING  Incomplete     Time coordinating discharge: 35 minutes  SIGNED:   Glade Lloyd, MD  [facility] 04/24/2023, 10:16 AM

## 2023-04-24 NOTE — Care Management Obs Status (Signed)
MEDICARE OBSERVATION STATUS NOTIFICATION   Patient Details  Name: Alexandria Bradley MRN: [id number] Date of Birth: Feb 03, 1961   Medicare Observation Status Notification Given:  Yes    Otelia Santee, LCSW 04/24/2023, 1:28 PM

## 2023-04-25 NOTE — Telephone Encounter (Signed)
The pt wants to see if we have any sooner appts with Dr Russella Dar for follow up diverticulitis. I did make her aware that no sooner appts available.  She will finish her budesonide and make appt with [facility] in the meantime.  (Referral to [facility] made while hospitalized)

## 2023-04-30 ENCOUNTER — Encounter: Payer: Self-pay | Admitting: Family Medicine

## 2023-04-30 ENCOUNTER — Ambulatory Visit (INDEPENDENT_AMBULATORY_CARE_PROVIDER_SITE_OTHER): Payer: BC Managed Care – PPO | Admitting: Family Medicine

## 2023-04-30 VITALS — BP 136/80 | HR 63 | Temp 98.3°F | Ht 65.0 in | Wt 165.0 lb

## 2023-04-30 DIAGNOSIS — E876 Hypokalemia: Secondary | ICD-10-CM

## 2023-04-30 DIAGNOSIS — K5792 Diverticulitis of intestine, part unspecified, without perforation or abscess without bleeding: Secondary | ICD-10-CM

## 2023-04-30 LAB — BASIC METABOLIC PANEL
BUN: 13 mg/dL (ref 6–23)
CO2: 26 mEq/L (ref 19–32)
Calcium: 10.4 mg/dL (ref 8.4–10.5)
Chloride: 100 mEq/L (ref 96–112)
Creatinine, Ser: 0.77 mg/dL (ref 0.40–1.20)
GFR: 81.56 mL/min (ref >60.00)
Glucose, Bld: 106 mg/dL — ABNORMAL HIGH (ref 70–99)
Potassium: 3.5 mEq/L (ref 3.5–5.1)
Sodium: 139 mEq/L (ref 135–145)

## 2023-04-30 NOTE — Patient Instructions (Signed)
Give [place] 2-3 business days to get the results of your labs back.   Advance your diet as tolerated.   If you don't hear anything from me regarding Dr. Russella Dar by the time you get back, please send me a message asking for an update.  Let us know if you need anything.

## 2023-04-30 NOTE — Progress Notes (Signed)
Chief Complaint  Patient presents with   Hospitalization Follow-up    Subjective: Patient is a 62 y.o. female here for f/u.  Admitted on 7/24-7/25 for diverticulitis.  She had a microperforation.  General surgery saw the patient and did not feel she needed emergent surgery.  She had another flare 4 months ago.  Due to the high frequency and a short amount of time, the surgery team was recommended removing her colon.  They would like for her to heal and then get a colonoscopy with Dr. Russella Dar of the GI team.  His next available appointment is when she was originally scheduled in October. Her pain is significantly better today. Advancing diet. No N/V/D/bleeding.   Past Medical History:  Diagnosis Date   Anxiety    Arthritis    Back pain    Breast cancer (HCC)    left   Fatty liver    GERD (gastroesophageal reflux disease)    Gout    Headache(784.0)    PMH : migraines   History of kidney stones    Hypercholesterolemia    Hypertension    Hypothyroidism    Osteoporosis    Personal history of chemotherapy    Personal history of radiation therapy    PONV (postoperative nausea and vomiting)    difficult to wake up   Pre-diabetes    Psoriasis    PVC's (premature ventricular contractions)    Radiation 08/18/14-10/07/14   Left breast   Sleep apnea    no CPAP    Objective: BP 136/80 (BP Location: Left Arm, Patient Position: Sitting, Cuff Size: Normal)   Pulse 63   Temp 98.3 F (36.8 C) (Oral)   Ht 5\' 5"  (1.651 m)   Wt 165 lb (74.8 kg)   SpO2 98%   BMI 27.46 kg/m  General: Awake, appears stated age Heart: RRR, no LE edema Lungs: CTAB, no rales, wheezes or rhonchi. No accessory muscle use Abd: BS+, Mild ttp over LLQ; ND Psych: Age appropriate judgment and insight, normal affect and mood  Assessment and Plan: Diverticulitis  Hypokalemia - Plan: Basic metabolic panel  Will reach out to Dr. Russella Dar to see if he wants to see her sooner. Finish Abx. No planned surg for next few  weeks as she recovers from flare.  F/u on K levels.  The patient voiced understanding and agreement to the plan.  I spent 32 min w pt discussing the above plans in addition to reviewing her chart and coordinating care w GI team on the same day of her visit.   Jilda Roche [place], DO 04/30/23  2:14 PM

## 2023-05-01 ENCOUNTER — Encounter: Payer: Self-pay | Admitting: Family Medicine

## 2023-05-01 ENCOUNTER — Encounter: Payer: Self-pay | Admitting: Gastroenterology

## 2023-05-10 ENCOUNTER — Other Ambulatory Visit: Payer: Self-pay | Admitting: Cardiovascular Disease

## 2023-05-15 ENCOUNTER — Encounter (INDEPENDENT_AMBULATORY_CARE_PROVIDER_SITE_OTHER): Payer: Self-pay

## 2023-05-22 ENCOUNTER — Ambulatory Visit: Payer: Medicare Other

## 2023-05-22 VITALS — Ht 65.0 in | Wt 161.0 lb

## 2023-05-22 DIAGNOSIS — Z1211 Encounter for screening for malignant neoplasm of colon: Secondary | ICD-10-CM

## 2023-05-22 MED ORDER — NA SULFATE-K SULFATE-MG SULF 17.5-3.13-1.6 GM/177ML PO SOLN
1.0000 | Freq: Once | ORAL | 0 refills | Status: AC
Start: 2023-05-22 — End: 2023-05-22

## 2023-05-22 NOTE — Progress Notes (Signed)

## 2023-05-26 ENCOUNTER — Encounter: Payer: Self-pay | Admitting: Family Medicine

## 2023-05-26 ENCOUNTER — Other Ambulatory Visit: Payer: Self-pay | Admitting: Family Medicine

## 2023-05-26 ENCOUNTER — Ambulatory Visit (INDEPENDENT_AMBULATORY_CARE_PROVIDER_SITE_OTHER): Payer: BC Managed Care – PPO | Admitting: Family Medicine

## 2023-05-26 VITALS — BP 135/85 | HR 69 | Temp 98.3°F | Ht 65.0 in | Wt 163.5 lb

## 2023-05-26 DIAGNOSIS — U071 COVID-19: Secondary | ICD-10-CM

## 2023-05-26 MED ORDER — METHYLPREDNISOLONE 4 MG PO TBPK
ORAL_TABLET | ORAL | 0 refills | Status: DC
Start: 2023-05-26 — End: 2023-06-17

## 2023-05-26 NOTE — Patient Instructions (Addendum)
Continue to push fluids, practice good hand hygiene, and cover your mouth if you cough.  If you start having fevers, shaking or shortness of breath, seek immediate care.  OK to continue your home OTC meds.   Change Allegra to Claritin. If no improvement in 2 days, take the Dosepak.   Let us know if you need anything.

## 2023-05-26 NOTE — Progress Notes (Signed)
Chief Complaint  Patient presents with   Follow-up    Covid Cough     Carolin Sicks here for URI complaints.  Duration:  11  days  Associated symptoms:  throat irritation, PND, slight cough, ear fullness on L Denies: sinus congestion, sinus pain, rhinorrhea, itchy watery eyes, ear pain, ear drainage, wheezing, shortness of breath, myalgia, and fevers, N/V/D , loss of taste/smell; loss of taste, fevers/myalgias, N/V/D resolved since bulk of s/s's.  Treatment to date: DM, Sudafed, Nasonex Sick contacts: No; was just on a cruise Tested + for covid on 05/17/23.  Past Medical History:  Diagnosis Date   Allergy    Anxiety    Arthritis    Back pain    Breast cancer (HCC)    left   Fatty liver    GERD (gastroesophageal reflux disease)    Gout    Headache(784.0)    PMH : migraines   History of kidney stones    Hypercholesterolemia    Hypertension    Hypothyroidism    Osteoporosis    Personal history of chemotherapy    Personal history of radiation therapy    PONV (postoperative nausea and vomiting)    difficult to wake up   Pre-diabetes    Psoriasis    PVC's (premature ventricular contractions)    Radiation 08/18/14-10/07/14   Left breast   Sleep apnea    no CPAP    Objective BP 135/85 (BP Location: Left Arm, Patient Position: Sitting, Cuff Size: Normal)   Pulse 69   Temp 98.3 F (36.8 C) (Oral)   Ht 5\' 5"  (1.651 m)   Wt 163 lb 8 oz (74.2 kg)   SpO2 95%   BMI 27.21 kg/m  General: Awake, alert, appears stated age HEENT: AT, [place], ears patent b/l and TM's neg, nares patent w/o discharge, pharynx pink and without exudates, MMM, no sinus TTP Neck: No masses or asymmetry Heart: RRR Lungs: CTAB, no accessory muscle use Psych: Age appropriate judgment and insight, normal mood and affect  COVID-19 - Plan: methylPREDNISolone (MEDROL DOSEPAK) 4 MG TBPK tablet  Continue Nasonex.  She will transition from Allegra to Claritin.  If no improvement in next couple days, she  will trial a Medrol Dosepak to see if this can help dry her up.  She has tolerated this medication well in the past.  Continue to push fluids, practice good hand hygiene, cover mouth when coughing. F/u prn. If starting to experience fevers, shaking, or shortness of breath, seek immediate care. Pt voiced understanding and agreement to the plan.  Jilda Roche [place], DO 05/26/23 4:22 PM

## 2023-06-04 ENCOUNTER — Other Ambulatory Visit: Payer: Self-pay | Admitting: Cardiovascular Disease

## 2023-06-10 ENCOUNTER — Encounter: Payer: Self-pay | Admitting: Gastroenterology

## 2023-06-10 ENCOUNTER — Ambulatory Visit (AMBULATORY_SURGERY_CENTER): Payer: BC Managed Care – PPO | Admitting: Gastroenterology

## 2023-06-10 VITALS — BP 168/60 | HR 53 | Temp 98.8°F | Resp 15 | Ht 65.0 in | Wt 161.0 lb

## 2023-06-10 DIAGNOSIS — K5140 Inflammatory polyps of colon without complications: Secondary | ICD-10-CM

## 2023-06-10 DIAGNOSIS — D125 Benign neoplasm of sigmoid colon: Secondary | ICD-10-CM | POA: Diagnosis present

## 2023-06-10 DIAGNOSIS — R197 Diarrhea, unspecified: Secondary | ICD-10-CM

## 2023-06-10 DIAGNOSIS — K5792 Diverticulitis of intestine, part unspecified, without perforation or abscess without bleeding: Secondary | ICD-10-CM | POA: Diagnosis not present

## 2023-06-10 MED ORDER — SODIUM CHLORIDE 0.9 % IV SOLN: 500.0000 mL | Freq: Once | INTRAVENOUS | Status: DC

## 2023-06-10 MED ORDER — DICYCLOMINE HCL 10 MG PO CAPS
10.0000 mg | ORAL_CAPSULE | Freq: Three times a day (TID) | ORAL | 2 refills | Status: DC | PRN
Start: 2023-06-10 — End: 2023-10-14

## 2023-06-10 NOTE — Progress Notes (Signed)
Sedate, gd SR, tolerated procedure well, VSS, report to RN 

## 2023-06-10 NOTE — Progress Notes (Signed)
Called to room to assist during endoscopic procedure.  Patient ID and intended procedure confirmed with present staff. Received instructions for my participation in the procedure from the performing physician.  

## 2023-06-10 NOTE — Progress Notes (Signed)
Pt's states no medical or surgical changes since previsit or office visit. 

## 2023-06-10 NOTE — Op Note (Addendum)
[facility] Patient Name: Alexandria Bradley Procedure Date: 06/10/2023 1:52 PM MRN: [id number] Endoscopist: Meryl Dare , MD, [id number] Age: 62 Referring MD:  Date of Birth: 11-04-1960 Gender: Female Account #: [id number] Procedure:                Colonoscopy Indications:              Clinically significant diarrhea of unexplained                            origin, Follow-up of diverticulitis Medicines:                Monitored Anesthesia Care Procedure:                Pre-Anesthesia Assessment:                           - Prior to the procedure, a History and Physical                            was performed, and patient medications and                            allergies were reviewed. The patient's tolerance of                            previous anesthesia was also reviewed. The risks                            and benefits of the procedure and the sedation                            options and risks were discussed with the patient.                            All questions were answered, and informed consent                            was obtained. Prior Anticoagulants: The patient has                            taken no anticoagulant or antiplatelet agents. ASA                            Grade Assessment: II - A patient with mild systemic                            disease. After reviewing the risks and benefits,                            the patient was deemed in satisfactory condition to                            undergo the procedure.  After obtaining informed consent, the colonoscope                            was passed under direct vision. Throughout the                            procedure, the patient's blood pressure, pulse, and                            oxygen saturations were monitored continuously. The                            Olympus CF-HQ190L [id number]) Colonoscope was                            introduced through the  anus and advanced to the the                            terminal ileum, with identification of the                            appendiceal orifice and IC valve. The terminal                            ileum, ileocecal valve, appendiceal orifice, and                            rectum were photographed. The quality of the bowel                            preparation was good. The colonoscopy was performed                            without difficulty. The patient tolerated the                            procedure well. Scope In: 1:58:35 PM Scope Out: 2:18:35 PM Scope Withdrawal Time: 0 hours 16 minutes 3 seconds  Total Procedure Duration: 0 hours 20 minutes 0 seconds  Findings:                 The perianal and digital rectal examinations were                            normal.                           The terminal ileum appeared normal.                           A 7 mm polyp was found in the sigmoid colon. The                            polyp was sessile. The polyp was removed with a  cold snare. Resection and retrieval were complete.                           Multiple medium-mouthed and small-mouthed                            diverticula were found in the left colon. There was                            evidence of diverticular spasm. There was evidence                            of an impacted diverticulum. There was no evidence                            of diverticular bleeding.                           The exam was otherwise without abnormality on                            direct and retroflexion views. Random biopsies                            obtained throughout. Complications:            No immediate complications. Estimated blood loss:                            None. Estimated Blood Loss:     Estimated blood loss: none. Impression:               - Normal terminal ileum                           - One 7 mm polyp in the sigmoid colon, removed with                             a cold snare. Resected and retrieved.                           - Moderate diverticulosis in the left colon.                           - The examination was otherwise normal on direct                            and retroflexion views. Biopsied. Recommendation:           - Repeat colonoscopy after studies are complete for                            surveillance based on pathology results.                           - Patient has a contact number available for  emergencies. The signs and symptoms of potential                            delayed complications were discussed with the                            patient. Return to normal activities tomorrow.                            Written discharge instructions were provided to the                            patient.                           - Resume previous diet adding high fiber.                           - Continue present medications.                           - Dicyclomine 10 mg po tid prn abdominal pain, #90,                            2 refills.                           - Await pathology results. Meryl Dare, MD 06/10/2023 2:23:27 PM This report has been signed electronically.

## 2023-06-10 NOTE — Patient Instructions (Addendum)
Await pathology results.  Handout on diverticulosis and polyps provided.  Continue present medications.  A prescription for Dicyclomine 10mg  by mouth three times a day as needed, has been sent to your pharmacy.  YOU HAD AN ENDOSCOPIC PROCEDURE TODAY AT THE [facility]:   Refer to the procedure report that was given to you for any specific questions about what was found during the examination.  If the procedure report does not answer your questions, please call your gastroenterologist to clarify.  If you requested that your care partner not be given the details of your procedure findings, then the procedure report has been included in a sealed envelope for you to review at your convenience later.  YOU SHOULD EXPECT: Some feelings of bloating in the abdomen. Passage of more gas than usual.  Walking can help get rid of the air that was put into your GI tract during the procedure and reduce the bloating. If you had a lower endoscopy (such as a colonoscopy or flexible sigmoidoscopy) you may notice spotting of blood in your stool or on the toilet paper. If you underwent a bowel prep for your procedure, you may not have a normal bowel movement for a few days.  Please Note:  You might notice some irritation and congestion in your nose or some drainage.  This is from the oxygen used during your procedure.  There is no need for concern and it should clear up in a day or so.  SYMPTOMS TO REPORT IMMEDIATELY:  Following lower endoscopy (colonoscopy or flexible sigmoidoscopy):  Excessive amounts of blood in the stool  Significant tenderness or worsening of abdominal pains  Swelling of the abdomen that is new, acute  Fever of 100F or higher   For urgent or emergent issues, a gastroenterologist can be reached at any hour by calling [phone]. Do not use MyChart messaging for urgent concerns.    DIET:  We do recommend a small meal at first, but then you may proceed to your regular diet.   Drink plenty of fluids but you should avoid alcoholic beverages for 24 hours.  ACTIVITY:  You should plan to take it easy for the rest of today and you should NOT DRIVE or use heavy machinery until tomorrow (because of the sedation medicines used during the test).    FOLLOW UP: Our staff will call the number listed on your records the next business day following your procedure.  We will call around 7:15- 8:00 am to check on you and address any questions or concerns that you may have regarding the information given to you following your procedure. If we do not reach you, we will leave a message.     If any biopsies were taken you will be contacted by phone or by letter within the next 1-3 weeks.  Please call us at [phone] if you have not heard about the biopsies in 3 weeks.    SIGNATURES/CONFIDENTIALITY: You and/or your care partner have signed paperwork which will be entered into your electronic medical record.  These signatures attest to the fact that that the information above on your After Visit Summary has been reviewed and is understood.  Full responsibility of the confidentiality of this discharge information lies with you and/or your care-partner.

## 2023-06-10 NOTE — Progress Notes (Signed)
See 8/26 H&P no changes

## 2023-06-11 ENCOUNTER — Telehealth: Payer: Self-pay | Admitting: *Deleted

## 2023-06-11 NOTE — Telephone Encounter (Signed)
Left message on f/u call 

## 2023-06-11 NOTE — Telephone Encounter (Signed)
IBgard 1-2 po tid prn (OTC)

## 2023-06-11 NOTE — Telephone Encounter (Signed)
Patient called states she was given Dicyclomine yesterday, said she did not realize what it was until today and she is really hesitant to take it because it slows things down for her and she doesn't want to be in the situation of being constipated. Seeking an alternative.

## 2023-06-13 LAB — SURGICAL PATHOLOGY

## 2023-06-16 ENCOUNTER — Telehealth: Payer: Self-pay | Admitting: *Deleted

## 2023-06-16 NOTE — Telephone Encounter (Signed)
Returned the patient's phone call. Left her a detailed message that she could take IB gard 1-2 tabs PO three times a day as needed instead of the Bentyl that was prescribed.

## 2023-06-17 ENCOUNTER — Ambulatory Visit (HOSPITAL_BASED_OUTPATIENT_CLINIC_OR_DEPARTMENT_OTHER)
Admission: RE | Admit: 2023-06-17 | Discharge: 2023-06-17 | Disposition: A | Discharge status: Home / Self Care | Payer: BC Managed Care – PPO | Source: Ambulatory Visit | Attending: Family Medicine | Admitting: Family Medicine

## 2023-06-17 ENCOUNTER — Ambulatory Visit: Payer: BC Managed Care – PPO | Admitting: Family Medicine

## 2023-06-17 ENCOUNTER — Encounter: Payer: Self-pay | Admitting: Family Medicine

## 2023-06-17 VITALS — BP 120/76 | HR 62 | Temp 98.3°F | Ht 65.0 in | Wt 165.0 lb

## 2023-06-17 DIAGNOSIS — M79645 Pain in left finger(s): Secondary | ICD-10-CM

## 2023-06-17 DIAGNOSIS — R5383 Other fatigue: Secondary | ICD-10-CM

## 2023-06-17 DIAGNOSIS — M1A9XX0 Chronic gout, unspecified, without tophus (tophi): Secondary | ICD-10-CM

## 2023-06-17 DIAGNOSIS — I10 Essential (primary) hypertension: Secondary | ICD-10-CM | POA: Diagnosis not present

## 2023-06-17 DIAGNOSIS — H938X3 Other specified disorders of ear, bilateral: Secondary | ICD-10-CM

## 2023-06-17 MED ORDER — TRIAMTERENE-HCTZ 37.5-25 MG PO TABS: 1.0000 | ORAL_TABLET | Freq: Every day | ORAL | 3 refills | Status: DC

## 2023-06-17 NOTE — Progress Notes (Signed)
Chief Complaint  Patient presents with   Annual Exam    Alexandria Bradley is a 62 y.o. female here for gout.  Currently being treated with allopurinol 100 mg/d. The joint(s) affected include: feet Reports compliance. Side effects of medications: None Is avoiding seafood, sweet/sugary beverages, alcohol, and red meats.  Hypertension Patient presents for hypertension follow up. She does monitor home blood pressures. Blood pressures ranging on average from 110's/70's. She is compliant with medications- Maxzide 37-5-25 mg/d, Cardizem 240 mg/d. Patient has these side effects of medication: none She is usually adhering to a healthy diet overall. Exercise: no routine exercise No CP or SOB.   Fatigue-since April, the patient has been having worsening fatigue.  She feels tired all the time.  She is not having any weakness unrelated to feeling tired.  She tries to get enough sleep.  Diet/exercise as above.  She is stressed about medical maladies.  She is compliant with thyroid medication.  Finger pain-patient has pain of her proximal joint of both ring fingers.  Mainly in her left currently.  It seems to alternate sides.  No recent injury or change in activity.  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  She continues to have fullness in both of her ears.  The right will hurt intermittently and the left one will feel like there is water sloshing around.  She does not use Q-tips.  She is not having any drainage or fevers.  She has tried nasal, Allegra, and a Medrol Dosepak.  Nothing has helped.  She saw ENT 7 years ago but has not seen them since then.  Past Medical History:  Diagnosis Date   Allergy    Anxiety    Arthritis    Back pain    Breast cancer (HCC)    left   Fatty liver    GERD (gastroesophageal reflux disease)    Gout    Headache(784.0)    PMH : migraines   History of kidney stones    Hypercholesterolemia    Hypertension    Hypothyroidism    Osteoporosis     Personal history of chemotherapy    Personal history of radiation therapy    PONV (postoperative nausea and vomiting)    difficult to wake up   Pre-diabetes    Psoriasis    PVC's (premature ventricular contractions)    Radiation 08/18/14-10/07/14   Left breast   Sleep apnea    no CPAP    BP 120/76 (BP Location: Right Arm, Patient Position: Sitting, Cuff Size: Normal)   Pulse 62   Temp 98.3 F (36.8 C) (Oral)   Ht 5\' 5"  (1.651 m)   Wt 165 lb (74.8 kg)   SpO2 97%   BMI 27.46 kg/m  Gen: Awake, alert, appears stated age Neck: No masses or asymmetry Ears: Canals are patent without otorrhea, TMs negative bilaterally, no external tenderness Heart: RRR, no bruits, no LE edema Lungs: CTAB, no accessory muscle use MSK: There is swelling and TTP of the fourth PIP on the left hand.  No ecchymosis, erythema, or excessive warmth.  Decreased range of motion at this joint.  Unremarkable joint exam on the right PIP of the fourth digit. Skin: No erythema or warmth Psych: Age appropriate judgment and insight, nml mood and affect  Fatigue, unspecified type - Plan: CBC, TSH  Essential hypertension - Plan: Comprehensive metabolic panel, Lipid panel  Chronic gout involving toe of left foot without tophus, unspecified cause - Plan: Uric acid  Finger pain, left - Plan: DG Finger Ring Left  Sensation of fullness in both ears - Plan: Ambulatory referral to ENT  Check labs. Chronic, stable.  Continue Maxide HCT 37.5-25 mg daily, Cardizem 240 mg daily.  Counseled on diet and exercise. Chronic, stable.  Continue allopurinol 100 mg daily. Reminded to avoid foods like alcohol, sweet beverages, red meat, lunch meat, sea food. Chronic, not currently controlled.  Topical anti-inflammatories, Tylenol, ice, heat, stretches and exercises.  Check x-ray.  If still bothersome and x-rays unremarkable, would consider referral to the hand team.  If suggestive of psoriatic arthritis, would refer to  rheumatology. She has failed antihistamines, nasal corticosteroids, and oral corticosteroids.  Will refer to ENT for further evaluation. F/u in 6 months. The patient voiced understanding and agreement to the plan.  Jilda Roche [place], DO 06/17/23 10:11 AM

## 2023-06-17 NOTE — Patient Instructions (Signed)
Give [place] 2-3 business days to get the results of your labs back.   Keep the diet clean and stay active.  If you do not hear anything about your referral in the next 1-2 weeks, call our office and ask for an update.  We will be in touch with your X-ray results.   Ice/cold pack over area for 10-15 min twice daily.  Heat (pad or rice pillow in microwave) over affected area, 10-15 minutes twice daily.   OK to take Tylenol 1000 mg (2 extra strength tabs) or 975 mg (3 regular strength tabs) every 6 hours as needed.  Let us know if you need anything.  Hand Exercises Hand exercises can be helpful for almost anyone. These exercises can strengthen the hands, improve flexibility and movement, and increase blood flow to the hands. These results can make work and daily tasks easier. Hand exercises can be especially helpful for people who have joint pain from arthritis or have nerve damage from overuse (carpal tunnel syndrome). These exercises can also help people who have injured a hand. Exercises Most of these hand exercises are gentle stretching and motion exercises. It is usually safe to do them often throughout the day. Warming up your hands before exercise may help to reduce stiffness. You can do this with gentle massage or by placing your hands in warm water for 10-15 minutes. It is normal to feel some stretching, pulling, tightness, or mild discomfort as you begin new exercises. This will gradually improve. Stop an exercise right away if you feel sudden, severe pain or your pain gets worse. Ask your health care provider which exercises are best for you. Knuckle bend or "claw" fist Stand or sit with your arm, hand, and all five fingers pointed straight up. Make sure to keep your wrist straight during the exercise. Gently bend your fingers down toward your palm until the tips of your fingers are touching the top of your palm. Keep your big knuckle straight and just bend the small knuckles in your  fingers. Hold this position for 3 seconds. Straighten (extend) your fingers back to the starting position. Repeat this exercise 5-10 times with each hand. Full finger fist Stand or sit with your arm, hand, and all five fingers pointed straight up. Make sure to keep your wrist straight during the exercise. Gently bend your fingers into your palm until the tips of your fingers are touching the middle of your palm. Hold this position for 3 seconds. Extend your fingers back to the starting position, stretching every joint fully. Repeat this exercise 5-10 times with each hand. Straight fist Stand or sit with your arm, hand, and all five fingers pointed straight up. Make sure to keep your wrist straight during the exercise. Gently bend your fingers at the big knuckle, where your fingers meet your hand, and the middle knuckle. Keep the knuckle at the tips of your fingers straight and try to touch the bottom of your palm. Hold this position for 3 seconds. Extend your fingers back to the starting position, stretching every joint fully. Repeat this exercise 5-10 times with each hand. Tabletop Stand or sit with your arm, hand, and all five fingers pointed straight up. Make sure to keep your wrist straight during the exercise. Gently bend your fingers at the big knuckle, where your fingers meet your hand, as far down as you can while keeping the small knuckles in your fingers straight. Think of forming a tabletop with your fingers. Hold this position for  3 seconds. Extend your fingers back to the starting position, stretching every joint fully. Repeat this exercise 5-10 times with each hand. Finger spread Place your hand flat on a table with your palm facing down. Make sure your wrist stays straight as you do this exercise. Spread your fingers and thumb apart from each other as far as you can until you feel a gentle stretch. Hold this position for 3 seconds. Bring your fingers and thumb tight together  again. Hold this position for 3 seconds. Repeat this exercise 5-10 times with each hand. Making circles Stand or sit with your arm, hand, and all five fingers pointed straight up. Make sure to keep your wrist straight during the exercise. Make a circle by touching the tip of your thumb to the tip of your index finger. Hold for 3 seconds. Then open your hand wide. Repeat this motion with your thumb and each finger on your hand. Repeat this exercise 5-10 times with each hand. Thumb motion Sit with your forearm resting on a table and your wrist straight. Your thumb should be facing up toward the ceiling. Keep your fingers relaxed as you move your thumb. Lift your thumb up as high as you can toward the ceiling. Hold for 3 seconds. Bend your thumb across your palm as far as you can, reaching the tip of your thumb for the small finger (pinkie) side of your palm. Hold for 3 seconds. Repeat this exercise 5-10 times with each hand. Grip strengthening  Hold a stress ball or other soft ball in the middle of your hand. Slowly increase the pressure, squeezing the ball as much as you can without causing pain. Think of bringing the tips of your fingers into the middle of your palm. All of your finger joints should bend when doing this exercise. Hold your squeeze for 3 seconds, then relax. Repeat this exercise 5-10 times with each hand. Contact a health care provider if: Your hand pain or discomfort gets much worse when you do an exercise. Your hand pain or discomfort does not improve within 2 hours after you exercise. If you have any of these problems, stop doing these exercises right away. Do not do them again unless your health care provider says that you can. Get help right away if: You develop sudden, severe hand pain or swelling. If this happens, stop doing these exercises right away. Do not do them again unless your health care provider says that you can. Make sure you discuss any questions you have  with your health care provider. Document Revised: 01/07/2019 Document Reviewed: 09/17/2018 Elsevier Patient Education  2020 [employer].

## 2023-06-18 ENCOUNTER — Other Ambulatory Visit: Payer: Self-pay | Admitting: Family Medicine

## 2023-06-18 DIAGNOSIS — E782 Mixed hyperlipidemia: Secondary | ICD-10-CM

## 2023-06-18 DIAGNOSIS — E876 Hypokalemia: Secondary | ICD-10-CM

## 2023-06-18 DIAGNOSIS — E038 Other specified hypothyroidism: Secondary | ICD-10-CM

## 2023-06-18 MED ORDER — POTASSIUM CHLORIDE ER 10 MEQ PO TBCR: 10.0000 meq | EXTENDED_RELEASE_TABLET | Freq: Every day | ORAL | 2 refills | Status: DC

## 2023-06-20 ENCOUNTER — Other Ambulatory Visit (INDEPENDENT_AMBULATORY_CARE_PROVIDER_SITE_OTHER): Payer: BC Managed Care – PPO

## 2023-06-20 ENCOUNTER — Encounter: Payer: Self-pay | Admitting: Family Medicine

## 2023-06-20 ENCOUNTER — Other Ambulatory Visit: Payer: Self-pay | Admitting: Family Medicine

## 2023-06-20 DIAGNOSIS — E038 Other specified hypothyroidism: Secondary | ICD-10-CM

## 2023-06-20 DIAGNOSIS — E782 Mixed hyperlipidemia: Secondary | ICD-10-CM

## 2023-06-20 DIAGNOSIS — E876 Hypokalemia: Secondary | ICD-10-CM

## 2023-06-20 LAB — T4, FREE: Free T4: 1.23 ng/dL (ref 0.60–1.60)

## 2023-06-20 LAB — BASIC METABOLIC PANEL
BUN: 11 mg/dL (ref 6–23)
CO2: 27 mEq/L (ref 19–32)
Calcium: 10 mg/dL (ref 8.4–10.5)
Chloride: 102 mEq/L (ref 96–112)
Creatinine, Ser: 0.67 mg/dL (ref 0.40–1.20)
GFR: 92.33 mL/min (ref >60.00)
Glucose, Bld: 95 mg/dL (ref 70–99)
Potassium: 3.7 mEq/L (ref 3.5–5.1)
Sodium: 140 mEq/L (ref 135–145)

## 2023-06-20 LAB — LIPID PANEL
Cholesterol: 215 mg/dL — ABNORMAL HIGH (ref 0–200)
HDL: 42 mg/dL (ref >39.00)
LDL Cholesterol: 127 mg/dL — ABNORMAL HIGH (ref 0–99)
NonHDL: 173.02
Total CHOL/HDL Ratio: 5
Triglycerides: 231 mg/dL — ABNORMAL HIGH (ref 0.0–149.0)
VLDL: 46.2 mg/dL — ABNORMAL HIGH (ref 0.0–40.0)

## 2023-06-20 LAB — MAGNESIUM: Magnesium: 2 mg/dL (ref 1.5–2.5)

## 2023-06-24 ENCOUNTER — Other Ambulatory Visit: Payer: Self-pay | Admitting: Family Medicine

## 2023-06-24 ENCOUNTER — Encounter: Payer: Self-pay | Admitting: Family Medicine

## 2023-06-24 DIAGNOSIS — M79646 Pain in unspecified finger(s): Secondary | ICD-10-CM

## 2023-06-26 ENCOUNTER — Encounter: Payer: Self-pay | Admitting: Gastroenterology

## 2023-07-08 ENCOUNTER — Ambulatory Visit: Payer: BC Managed Care – PPO | Admitting: Gastroenterology

## 2023-08-07 ENCOUNTER — Encounter (INDEPENDENT_AMBULATORY_CARE_PROVIDER_SITE_OTHER): Payer: Self-pay

## 2023-08-07 ENCOUNTER — Ambulatory Visit (INDEPENDENT_AMBULATORY_CARE_PROVIDER_SITE_OTHER): Payer: BC Managed Care – PPO | Admitting: Otolaryngology

## 2023-08-07 ENCOUNTER — Ambulatory Visit (INDEPENDENT_AMBULATORY_CARE_PROVIDER_SITE_OTHER): Payer: BC Managed Care – PPO | Admitting: Audiology

## 2023-08-07 VITALS — Wt 165.0 lb

## 2023-08-07 DIAGNOSIS — H6993 Unspecified Eustachian tube disorder, bilateral: Secondary | ICD-10-CM | POA: Diagnosis not present

## 2023-08-07 DIAGNOSIS — H903 Sensorineural hearing loss, bilateral: Secondary | ICD-10-CM

## 2023-08-07 DIAGNOSIS — R42 Dizziness and giddiness: Secondary | ICD-10-CM

## 2023-08-07 MED ORDER — AZELASTINE HCL 0.1 % NA SOLN
2.0000 | Freq: Two times a day (BID) | NASAL | 12 refills | Status: DC
Start: 2023-08-07 — End: 2024-04-22

## 2023-08-07 NOTE — Progress Notes (Signed)
  [address], [address] [place], [place] [address] [phone]  Audiological Evaluation    Name: Alexandria Bradley     DOB:   1961/04/29      MRN:   [id number]                                                                                     Service Date: 08/07/2023     Accompanied by: none   Patient comes today after Dr. Allena Katz, ENT sent a referral for a hearing evaluation due to concerns with hearing loss.   Symptoms Yes Details  Hearing loss  [x]  Hears better in the right ear.  Tinnitus  []    Ear pain/ Ear infections  [x]  When she has allergies she feels like her ears get stopped up and there is pressure/popping.  Balance problems  [x]  Diagnosed with labyrinthitis in 2017 and again earlier this years. Currently feels wobbly mainly she she stands up.  Noise exposure  []    Previous ear surgeries  []    Family history  [x]  Mother developed hearing loss with age.  Amplification  []    Other  []      Otoscopy: Right ear: Clear external ear canals and notable landmarks visualized on the tympanic membrane. Left ear:  Clear external ear canals and notable landmarks visualized on the tympanic membrane.  Tympanometry: Right ear: Type A- Normal external ear canal volume with normal middle ear pressure and tympanic membrane compliance Left ear: Type A- Normal external ear canal volume with normal middle ear pressure and tympanic membrane compliance   Pure tone Audiometry: Right ear- Normal to moderate sensorineural hearing loss from 250 Hz - 8000 Hz. Left ear-  Normal to mild sensorineural hearing loss from [phone]    The hearing test results were completed under headphones and re-checked with inserts and results are deemed to be of good reliability. Test technique:  conventional     Speech Audiometry: Right ear- Speech Reception Threshold (SRT) was obtained at 25 dBHL Left ear-Speech Reception Threshold (SRT) was obtained at 30 dBHL   Word Recognition Score Tested  using NU-6 (MLV) Right ear: 96% was obtained at a presentation level of 65 dBHL with contralateral masking which is deemed as  excellent Left ear: 96% was obtained at a presentation level of 65 dBHL with contralateral masking which is deemed as  excellent    Impression: There is a significant difference in pure-tone thresholds between ears, left is worse from [phone] and right is worse from 4000-8000 Hz. There is not a significant difference in the word recognition score in between ears.    Recommendations: Follow up with ENT as scheduled for today. Return for a hearing evaluation if concerns with hearing changes arise or per MD recommendation. Consider a communication needs assessment after medical clearance for hearing aids is obtained.   Senna Lape MARIE LEROUX-MARTINEZ, AUD

## 2023-08-07 NOTE — Patient Instructions (Addendum)
I have ordered an imaging study for you to complete prior to your next visit. Please call [facility] Scheduling at [phone] to schedule your imaging if you have not received a call within 24 hours. If you are unable to complete your imaging study prior to your next scheduled visit please call our office to let us know.

## 2023-08-07 NOTE — Progress Notes (Signed)
Dear Dr. Carmelia Roller, Here is my assessment for our mutual patient, Alexandria Bradley. Thank you for allowing me the opportunity to care for your patient. Please do not hesitate to contact me should you have any other questions. Sincerely, Dr. Jovita Kussmaul  Otolaryngology Clinic Note Referring provider: Dr. Carmelia Roller HPI:  [name] is a 62 y.o. female kindly referred by Dr. Carmelia Roller for evaluation of bilateral ear fullness or muffled hearing. She feels like for about the last year and a half, she would have some fullness in both of her ears, some popping/muffling, and feels like she is "under water." She feels like it is worst during Allergy season, but was consistent for a few months last year. She reports that she felt good in April/May/June but came back after then. Then seemed to get better, and now more so the left ear recently. No antecedent event She reports that right ear is better hearing ear currently. Does have some pressure sensitivity and some muffled hearing b/l. Have not otherwise noticed if hearing has gone down. She has some tinnitus, but has only happened over last 2 years She takes Allegra, Sudafed, and Mometasone/Nasonex spray. Does have history of Migraines - has visual auras and some auras. Cold/dark room helps. Nausea/Vomiting  She had an episode of labyrinthitis in 2017 when she saw Dr. Jenne Pane. She does not remember if her hearing is down at that point. She had a similar episode that lasted for a few days, and then got better on its own. She does Epley when she does this, and it helps sometimes. Not having any issues or episodes  She is already on dyazide for HTN  Patient currently denies: ear pain, drainage. Patient additionally denies: deep pain in ear canal Patient also denies barotrauma, vestibular suppressant use, did use carboplatin Prior ear surgery: no   H&N Surgery: tonsillectomy as child Personal or FHx of bleeding dz or anesthesia difficulty: no  GLP-1:  no AP/AC: no  Tobacco: quit 25 years ago, off and on. Occupation: Print production planner. Lives in [place].  PMHx: HTN, Breast Cancer (s/p mastectomies, Chemo/Rads and did get carboplatin), Allergies, GERD, HTN, OSA, Hypothyroidism  Independent Review of Additional Tests or Records:  Dr. Hollie Beach notes: She continues to have fullness in both of her ears. The right will hurt intermittently and the left one will feel like there is water sloshing around. She does not use Q-tips. She is not having any drainage or fevers. She has tried nasal, Allegra, and a Medrol Dosepak. Nothing has helped. She saw ENT 7 years ago but has not seen them since then.   Dr. Jenne Pane 2017: Vertigo; Dx with viral labyrinthitis  08/07/23 Audiogram was independently reviewed and interpreted by me and I agree with read: Asymmetric SNHL; Type A/A tymps --- left is worse from [phone] Hz and right is worse from 4000-8000 Hz.  Pure tone Audiometry: Right ear- Normal to moderate sensorineural hearing loss from 250 Hz - 8000 Hz. Left ear-  Normal to mild sensorineural hearing loss from [phone]   WRT 96% at 65 dB b/l  SNHL= Sensorineural hearing loss   PMH/Meds/All/SocHx/FamHx/ROS:   Past Medical History:  Diagnosis Date   Allergy    Anxiety    Arthritis    Back pain    Breast cancer (HCC)    left   Fatty liver    GERD (gastroesophageal reflux disease)    Gout    Headache(784.0)    PMH : migraines   History of kidney stones  Hypercholesterolemia    Hypertension    Hypothyroidism    Osteoporosis    Personal history of chemotherapy    Personal history of radiation therapy    PONV (postoperative nausea and vomiting)    difficult to wake up   Pre-diabetes    Psoriasis    PVC's (premature ventricular contractions)    Radiation 08/18/14-10/07/14   Left breast   Sleep apnea    no CPAP     Past Surgical History:  Procedure Laterality Date   BREAST BIOPSY Left 11/10/2018   BREAST LUMPECTOMY Left 2015    BREAST LUMPECTOMY WITH AXILLARY LYMPH NODE BIOPSY  02/2014   left   BREAST RECONSTRUCTION WITH PLACEMENT OF TISSUE EXPANDER AND ALLODERM Bilateral 04/12/2019   Procedure: BILATERAL BREAST RECONSTRUCTION WITH PLACEMENT OF TISSUE EXPANDERS; ALLODERM TO RIGHT CHEST;  Surgeon: Glenna Fellows, MD;  Location: [facility];  Service: Plastics;  Laterality: Bilateral;   BREAST RECONSTRUCTION WITH PLACEMENT OF TISSUE EXPANDER AND ALLODERM Right 01/24/2020   Procedure: BREAST RECONSTRUCTION WITH PLACEMENT OF TISSUE EXPANDER AND ALLODERM;  Surgeon: Glenna Fellows, MD;  Location: [facility];  Service: Plastics;  Laterality: Right;   CARDIAC CATHETERIZATION     CHOLECYSTECTOMY     colon polyps     COLONOSCOPY N/A 06/27/2014   Procedure: COLONOSCOPY;  Surgeon: Shirley Friar, MD;  Location: [facility];  Service: Endoscopy;  Laterality: N/A;   COLONOSCOPY  02/2020   LATISSIMUS FLAP TO BREAST Left 04/12/2019   Procedure: LEFT LATISSIMUS DORSI FLAP TO LEFT CHEST;  Surgeon: Glenna Fellows, MD;  Location: [facility];  Service: Plastics;  Laterality: Left;   LEFT HEART CATH AND CORONARY ANGIOGRAPHY N/A 07/24/2018   Procedure: LEFT HEART CATH AND CORONARY ANGIOGRAPHY;  Surgeon: Lennette Bihari, MD;  Location: [facility];  Service: Cardiovascular;  Laterality: N/A;   LIPOMA EXCISION     LIPOSUCTION WITH LIPOFILLING Bilateral 06/12/2020   Procedure: LIPOFILLING TO BILATERAL CHEST;  Surgeon: Glenna Fellows, MD;  Location: [facility];  Service: Plastics;  Laterality: Bilateral;   MASTECTOMY W/ SENTINEL NODE BIOPSY Bilateral 04/12/2019   Procedure: RIGHT BREAST RISK REDUCING MASTECTOMY; LEFT MASTECTOMY WITH LEFT AXILLARY SENTINEL LYMPH NODE BIOPSY WITH BLUE DYE INJECTION;  Surgeon: Emelia Loron, MD;  Location: [facility];  Service: General;  Laterality: Bilateral;   MYOMECTOMY     PORT-A-CATH REMOVAL N/A 02/03/2015   Procedure: REMOVAL PORT-A-CATH;  Surgeon: Emelia Loron, MD;  Location: [facility];  Service: General;  Laterality: N/A;   PORT-A-CATH REMOVAL Right 01/24/2020   Procedure: REMOVAL PORT-A-CATH;  Surgeon: Glenna Fellows, MD;  Location: [facility];  Service: Plastics;  Laterality: Right;   PORTACATH PLACEMENT N/A 03/01/2014   Procedure: INSERTION PORT-A-CATH;  Surgeon: Emelia Loron, MD;  Location: [facility];  Service: General;  Laterality: N/A;   PORTACATH PLACEMENT N/A 11/18/2018   Procedure: INSERTION PORT-A-CATH WITH ULTRASOUND;  Surgeon: Emelia Loron, MD;  Location: [facility];  Service: General;  Laterality: N/A;   REMOVAL OF BILATERAL TISSUE EXPANDERS WITH PLACEMENT OF BILATERAL BREAST IMPLANTS Bilateral 06/12/2020   Procedure: REMOVAL OF BILATERAL TISSUE EXPANDERS WITH PLACEMENT OF BILATERAL BREAST IMPLANTS;  Surgeon: Glenna Fellows, MD;  Location: [facility];  Service: Plastics;  Laterality: Bilateral;   REMOVAL OF TISSUE EXPANDER AND PLACEMENT OF IMPLANT Right 06/02/2019   Procedure: REMOVAL OF TISSUE EXPANDER RIGHT CHEST;  Surgeon: Glenna Fellows, MD;  Location: [facility];  Service: Plastics;  Laterality: Right;  TONSILLECTOMY     TUBAL LIGATION     WOUND DEBRIDEMENT Left 06/02/2019   Procedure: LEFT CHEST DEBRIDEMENT;  Surgeon: Glenna Fellows, MD;  Location: [facility];  Service: Plastics;  Laterality: Left;    Family History  Problem Relation Age of Onset   Endometrial cancer Mother 67   Hearing loss Mother    Atrial fibrillation Mother    Lung cancer Father    Brain cancer Father    Barrett's esophagus Sister    Heart disease Maternal Aunt    Atrial fibrillation Maternal Aunt    Lung cancer Maternal Uncle    Atrial fibrillation Maternal Uncle    Bladder Cancer Maternal Uncle    Multiple myeloma Maternal Uncle    Stroke Maternal Grandmother    Stroke Maternal Grandfather    Other Son        trisomy 13    Colon polyps Cousin    Colon cancer Neg Hx    Esophageal cancer Neg Hx    Rectal cancer Neg Hx    Stomach cancer Neg Hx      Social Connections: Socially Integrated (01/07/2023)   Social Connection and Isolation Panel [NHANES]    Frequency of Communication with Friends and Family: Twice a week    Frequency of Social Gatherings with Friends and Family: Once a week    Attends Religious Services: More than 4 times per year    Active Member of [employer] or Organizations: Yes    Attends Banker Meetings: 1 to 4 times per year    Marital Status: Married      Current Outpatient Medications:    albuterol (VENTOLIN HFA) 108 (90 Base) MCG/ACT inhaler, Inhale 2 puffs into the lungs every 6 (six) hours as needed for wheezing or shortness of breath (Cough)., Disp: 18 g, Rfl: 0   allopurinol (ZYLOPRIM) 100 MG tablet, TAKE 1 TABLET BY MOUTH EVERY DAY, Disp: 90 tablet, Rfl: 2   azelastine (ASTELIN) 0.1 % nasal spray, Place 2 sprays into both nostrils 2 (two) times daily. Use in each nostril as directed, Disp: 30 mL, Rfl: 12   betamethasone dipropionate 0.05 % cream, Apply 1 Application topically daily., Disp: , Rfl:    dicyclomine (BENTYL) 10 MG capsule, Take 1 capsule (10 mg total) by mouth 3 (three) times daily as needed for spasms., Disp: 90 capsule, Rfl: 2   diltiazem (CARDIZEM CD) 240 MG 24 hr capsule, TAKE 1 CAPSULE BY MOUTH EVERY DAY, Disp: 90 capsule, Rfl: 3   fexofenadine (ALLEGRA) 180 MG tablet, Take 180 mg by mouth daily., Disp: , Rfl:    Levothyroxine Sodium (TIROSINT) 150 MCG CAPS, Take 150 mcg by mouth daily before breakfast., Disp: , Rfl:    liothyronine (CYTOMEL) 5 MCG tablet, Take 5 mcg by mouth daily before breakfast. , Disp: , Rfl:    mometasone (NASONEX) 50 MCG/ACT nasal spray, Place 2 sprays into the nose daily as needed (allergies)., Disp: , Rfl:    potassium chloride (KLOR-CON 10) 10 MEQ tablet, Take 1 tablet (10 mEq total) by mouth daily., Disp: 90 tablet, Rfl: 2    traMADol (ULTRAM) 50 MG tablet, Take 1 tablet (50 mg total) by mouth every 6 (six) hours as needed for moderate pain., Disp: 20 tablet, Rfl: 0   triamterene-hydrochlorothiazide (MAXZIDE-25) 37.5-25 MG tablet, Take 1 tablet by mouth daily., Disp: 90 tablet, Rfl: 3   Physical Exam:   Wt 165 lb (74.8 kg)   BMI 27.46 kg/m   Salient findings:  CN II-XII intact  Bilateral EAC clear and TM intact with well pneumatized middle ear spaces Weber 512: midline Rinne 512: AC > BC b/l  Rine 1024: AC > BC b/l  Normal gait; no obvious nystagmus on EOM Anterior rhinoscopy: Septum relatively midline; bilateral inferior turbinates without significant hypertrophy No lesions of oral cavity/oropharynx; dentition fair No obviously palpable neck masses/lymphadenopathy/thyromegaly No respiratory distress or stridor  Seprately Identifiable Procedures:  None  Impression & Plans:  Alexandria Bradley is a 62 y.o. female with h/o HTN (on Dyazide), Breast cancer (received carboplatin prior), and Allergic rhinitis now with:  Bilateral ear fullness/popping and hearing loss - eustachian tube dysfunction Asymmetric SNHL History of Labyrinthitis with intermittent vertigo - Her symptoms from fullness/popping standpoint appear to be consistent with ETD b/l. She's on intranasal steroid but allergies are worse so will begin astelin - From vertigo standpoint, she could have meniere's on left given low freq differences, but her episodes are not consistent with it. She has also had Labyrinthitis before (Dr. Jenne Pane diagnosed her in 2017). These episodes do not feel like Migraine for her. She's already on dyazide and the symptoms are not very frequent. She is doing well from this standpoint currently without an acute change so will monitor and place her on a low sodium diet + continue dyazide  - Will obtain MRI IAC for asymmetric HL - If ear sx do not improve, consider BTT at next visit  - f/u 3 months with audio  See below  regarding exact medications prescribed this encounter including dosages and route: Meds ordered this encounter  Medications   azelastine (ASTELIN) 0.1 % nasal spray    Sig: Place 2 sprays into both nostrils 2 (two) times daily. Use in each nostril as directed    Dispense:  30 mL    Refill:  12   Thank you for allowing me the opportunity to care for your patient. Please do not hesitate to contact me should you have any other questions.  Sincerely, Jovita Kussmaul, MD Otolarynoglogist (ENT), [facility] Phone: [phone] Fax: [phone]  08/07/2023, 4:31 PM   I have personally spent 48 minutes involved in face-to-face and non-face-to-face activities for this patient on the day of the visit.  Professional time spent includes the following activities, in addition to those noted in the documentation: preparing to see the patient (review of outside documentation and results), performing a medically appropriate examination and/or evaluation, counseling and educating the patient/family/caregiver, ordering medications, referring and communicating with other healthcare professionals, documenting clinical information in the electronic or other health record, independently interpreting results and communicating results with the patient/family/caregiver

## 2023-08-11 ENCOUNTER — Ambulatory Visit: Payer: Medicare Other | Admitting: Hematology and Oncology

## 2023-08-15 ENCOUNTER — Inpatient Hospital Stay: Payer: BC Managed Care – PPO | Attending: Hematology and Oncology | Admitting: Hematology and Oncology

## 2023-08-15 ENCOUNTER — Ambulatory Visit (HOSPITAL_COMMUNITY)
Admission: RE | Admit: 2023-08-15 | Discharge: 2023-08-15 | Disposition: A | Discharge status: Home / Self Care | Payer: BC Managed Care – PPO | Source: Ambulatory Visit | Attending: Otolaryngology | Admitting: Otolaryngology

## 2023-08-15 VITALS — BP 130/63 | HR 62 | Temp 98.2°F | Resp 16 | Wt 165.8 lb

## 2023-08-15 DIAGNOSIS — C50212 Malignant neoplasm of upper-inner quadrant of left female breast: Secondary | ICD-10-CM | POA: Diagnosis not present

## 2023-08-15 DIAGNOSIS — Z923 Personal history of irradiation: Secondary | ICD-10-CM | POA: Diagnosis not present

## 2023-08-15 DIAGNOSIS — Z9221 Personal history of antineoplastic chemotherapy: Secondary | ICD-10-CM | POA: Insufficient documentation

## 2023-08-15 DIAGNOSIS — Z171 Estrogen receptor negative status [ER-]: Secondary | ICD-10-CM | POA: Insufficient documentation

## 2023-08-15 DIAGNOSIS — H903 Sensorineural hearing loss, bilateral: Secondary | ICD-10-CM | POA: Insufficient documentation

## 2023-08-15 DIAGNOSIS — Z9013 Acquired absence of bilateral breasts and nipples: Secondary | ICD-10-CM | POA: Insufficient documentation

## 2023-08-15 DIAGNOSIS — Z79899 Other long term (current) drug therapy: Secondary | ICD-10-CM | POA: Insufficient documentation

## 2023-08-15 MED ORDER — GADOBUTROL 1 MMOL/ML IV SOLN
7.0000 mL | Freq: Once | INTRAVENOUS | Status: AC | PRN
  Administered 2023-08-15: 7 mL via INTRAVENOUS

## 2023-08-15 NOTE — Progress Notes (Signed)
[facility] Cancer Follow up:    Alexandria Dory, DO [address] [place] [address]   DIAGNOSIS: triple negative breast cancer, locally recurrent (s/p bilateral mastectomies)  SUMMARY OF ONCOLOGIC HISTORY: Oncology History  Malignant neoplasm of upper-inner quadrant of left breast in female, estrogen receptor negative (HCC)  02/11/2014 Initial Diagnosis   Malignant neoplasm of upper-inner quadrant of left breast in female, estrogen receptor negative (HCC)   11/26/2018 - 03/18/2019 Chemotherapy   The patient had dexamethasone (DECADRON) 4 MG tablet, 8 mg, Oral, Daily, 1 of 1 cycle, Start date: 11/13/2018, End date: 01/28/2019 palonosetron (ALOXI) injection 0.25 mg, 0.25 mg, Intravenous,  Once, 6 of 6 cycles Administration: 0.25 mg (11/26/2018), 0.25 mg (12/03/2018), 0.25 mg (03/11/2019), 0.25 mg (03/18/2019), 0.25 mg (01/07/2019), 0.25 mg (12/17/2018), 0.25 mg (12/24/2018), 0.25 mg (01/14/2019), 0.25 mg (01/28/2019), 0.25 mg (02/04/2019), 0.25 mg (02/18/2019), 0.25 mg (02/25/2019) gemcitabine (GEMZAR) 2,000 mg in sodium chloride 0.9 % 250 mL chemo infusion, 2,000 mg, Intravenous,  Once, 1 of 1 cycle Administration: 2,000 mg (02/18/2019) CARBOplatin (PARAPLATIN) 250 mg in sodium chloride 0.9 % 250 mL chemo infusion, 250 mg (100 % of original dose 253.2 mg), Intravenous,  Once, 6 of 6 cycles Dose modification:   (original dose 253.2 mg, Cycle 1) Administration: 250 mg (11/26/2018), 250 mg (12/03/2018), 250 mg (03/11/2019), 250 mg (03/18/2019), 250 mg (01/07/2019), 250 mg (12/17/2018), 250 mg (12/24/2018), 250 mg (01/14/2019), 250 mg (01/28/2019), 250 mg (02/04/2019), 250 mg (02/18/2019), 250 mg (02/25/2019) gemcitabine (GEMZAR) 1,976 mg in sodium chloride 0.9 % 250 mL chemo infusion, 1,000 mg/m2 = 1,976 mg, Intravenous,  Once, 6 of 6 cycles Administration: 1,976 mg (11/26/2018), 2,000 mg (12/03/2018), 2,000 mg (03/11/2019), 2,000 mg (03/18/2019), 2,000 mg (01/07/2019), 2,000 mg (12/17/2018),  2,000 mg (12/24/2018), 2,000 mg (01/14/2019), 2,000 mg (01/28/2019), 2,000 mg (02/04/2019), 2,000 mg (02/25/2019)  for chemotherapy treatment.    06/09/2019 - 09/22/2019 Chemotherapy   The patient had pembrolizumab (KEYTRUDA) 200 mg in sodium chloride 0.9 % 50 mL chemo infusion, 200 mg, Intravenous, Once, 6 of 6 cycles Administration: 200 mg (06/09/2019), 200 mg (06/30/2019), 200 mg (07/21/2019), 200 mg (08/11/2019), 200 mg (09/01/2019), 200 mg (09/22/2019)  for chemotherapy treatment.    Recurrent cancer of left breast (HCC)  11/13/2018 Initial Diagnosis   Recurrent cancer of left breast (HCC)   11/26/2018 - 03/18/2019 Chemotherapy   The patient had dexamethasone (DECADRON) 4 MG tablet, 8 mg, Oral, Daily, 1 of 1 cycle, Start date: 11/13/2018, End date: 01/28/2019 palonosetron (ALOXI) injection 0.25 mg, 0.25 mg, Intravenous,  Once, 6 of 6 cycles Administration: 0.25 mg (11/26/2018), 0.25 mg (12/03/2018), 0.25 mg (03/11/2019), 0.25 mg (03/18/2019), 0.25 mg (01/07/2019), 0.25 mg (12/17/2018), 0.25 mg (12/24/2018), 0.25 mg (01/14/2019), 0.25 mg (01/28/2019), 0.25 mg (02/04/2019), 0.25 mg (02/18/2019), 0.25 mg (02/25/2019) gemcitabine (GEMZAR) 2,000 mg in sodium chloride 0.9 % 250 mL chemo infusion, 2,000 mg, Intravenous,  Once, 1 of 1 cycle Administration: 2,000 mg (02/18/2019) CARBOplatin (PARAPLATIN) 250 mg in sodium chloride 0.9 % 250 mL chemo infusion, 250 mg (100 % of original dose 253.2 mg), Intravenous,  Once, 6 of 6 cycles Dose modification:   (original dose 253.2 mg, Cycle 1) Administration: 250 mg (11/26/2018), 250 mg (12/03/2018), 250 mg (03/11/2019), 250 mg (03/18/2019), 250 mg (01/07/2019), 250 mg (12/17/2018), 250 mg (12/24/2018), 250 mg (01/14/2019), 250 mg (01/28/2019), 250 mg (02/04/2019), 250 mg (02/18/2019), 250 mg (02/25/2019) gemcitabine (GEMZAR) 1,976 mg in sodium chloride 0.9 % 250 mL chemo infusion, 1,000 mg/m2 =  1,976 mg, Intravenous,  Once, 6 of 6 cycles Administration: 1,976 mg (11/26/2018), 2,000 mg (12/03/2018), 2,000  mg (03/11/2019), 2,000 mg (03/18/2019), 2,000 mg (01/07/2019), 2,000 mg (12/17/2018), 2,000 mg (12/24/2018), 2,000 mg (01/14/2019), 2,000 mg (01/28/2019), 2,000 mg (02/04/2019), 2,000 mg (02/25/2019)  for chemotherapy treatment.      CURRENT THERAPY: Observation  INTERVAL HISTORY:  Alexandria Bradley 62 y.o. female returns for evaluation of her h/o locally recurrent triple negative breast cancer.    She continues to complain of chronic fatigue.  Other than chronic fatigue she had a couple episodes of diverticulitis, now has been dealing with some left-sided ear fullness and hearing loss and has an MRI of the internal auditory canal scheduled later today since her mom had acoustic neuroma. She otherwise has denied any changes in her breast.  She had bilateral mastectomy.  No change in breathing, bowel habits or urinary habits. Rest of the pertinent 10 point ROS reviewed and negative.  Patient Active Problem List   Diagnosis Date Noted   Perforation of sigmoid colon due to diverticulitis 04/23/2023   Hypokalemia 04/23/2023   Hyperproteinemia 04/23/2023   Hyponatremia 04/23/2023   Metabolic acidosis, increased anion gap 04/23/2023   Sepsis (HCC) 01/09/2023   Diverticulitis 01/08/2023   Chronic gout involving toe of left foot without tophus 05/11/2021   Acquired absence of breast and nipple, bilateral 04/20/2019   Recurrent cancer of left breast (HCC) 11/13/2018   Neuropathy due to chemotherapeutic drug (HCC) 11/13/2018   Prediabetes 05/28/2018   OSA (obstructive sleep apnea) 08/20/2016   Adjustment disorder with mixed anxiety and depressed mood 08/20/2016   Genetic testing 07/07/2015   Family history of uterine cancer    Family history of bladder cancer    Fatty infiltration of liver 07/08/2014   Chemotherapy-induced nausea 06/10/2014   Malignant neoplasm of upper-inner quadrant of left breast in female, estrogen receptor negative (HCC) 02/11/2014   HLD (hyperlipidemia) 05/11/2010   PSORIASIS  05/11/2010   Nonspecific (abnormal) findings on radiological and other examination of body structure 05/11/2010   CHEST XRAY, ABNORMAL 05/11/2010   Hypothyroidism 05/10/2010   Essential hypertension 09/02/2007   Allergic rhinitis, cause unspecified 09/02/2007    is allergic to chlorhexidine, ciprofloxacin, prednisone, codeine, cortisone, and colchicine.  MEDICAL HISTORY: Past Medical History:  Diagnosis Date   Allergy    Anxiety    Arthritis    Back pain    Breast cancer (HCC)    left   Fatty liver    GERD (gastroesophageal reflux disease)    Gout    Headache(784.0)    PMH : migraines   History of kidney stones    Hypercholesterolemia    Hypertension    Hypothyroidism    Osteoporosis    Personal history of chemotherapy    Personal history of radiation therapy    PONV (postoperative nausea and vomiting)    difficult to wake up   Pre-diabetes    Psoriasis    PVC's (premature ventricular contractions)    Radiation 08/18/14-10/07/14   Left breast   Sleep apnea    no CPAP    SURGICAL HISTORY: Past Surgical History:  Procedure Laterality Date   BREAST BIOPSY Left 11/10/2018   BREAST LUMPECTOMY Left 2015   BREAST LUMPECTOMY WITH AXILLARY LYMPH NODE BIOPSY  02/2014   left   BREAST RECONSTRUCTION WITH PLACEMENT OF TISSUE EXPANDER AND ALLODERM Bilateral 04/12/2019   Procedure: BILATERAL BREAST RECONSTRUCTION WITH PLACEMENT OF TISSUE EXPANDERS; ALLODERM TO RIGHT CHEST;  Surgeon: Glenna Fellows, MD;  Location: [facility];  Service: Plastics;  Laterality: Bilateral;   BREAST RECONSTRUCTION WITH PLACEMENT OF TISSUE EXPANDER AND ALLODERM Right 01/24/2020   Procedure: BREAST RECONSTRUCTION WITH PLACEMENT OF TISSUE EXPANDER AND ALLODERM;  Surgeon: Glenna Fellows, MD;  Location: [facility];  Service: Plastics;  Laterality: Right;   CARDIAC CATHETERIZATION     CHOLECYSTECTOMY     colon polyps     COLONOSCOPY N/A 06/27/2014   Procedure: COLONOSCOPY;  Surgeon:  Shirley Friar, MD;  [facility];  Service: Endoscopy;  Laterality: N/A;   COLONOSCOPY  02/2020   LATISSIMUS FLAP TO BREAST Left 04/12/2019   Procedure: LEFT LATISSIMUS DORSI FLAP TO LEFT CHEST;  Surgeon: Glenna Fellows, MD;  Location: [facility];  Service: Plastics;  Laterality: Left;   LEFT HEART CATH AND CORONARY ANGIOGRAPHY N/A 07/24/2018   Procedure: LEFT HEART CATH AND CORONARY ANGIOGRAPHY;  Surgeon: Lennette Bihari, MD;  Location: [facility];  Service: Cardiovascular;  Laterality: N/A;   LIPOMA EXCISION     LIPOSUCTION WITH LIPOFILLING Bilateral 06/12/2020   Procedure: LIPOFILLING TO BILATERAL CHEST;  Surgeon: Glenna Fellows, MD;  Location: [facility];  Service: Plastics;  Laterality: Bilateral;   MASTECTOMY W/ SENTINEL NODE BIOPSY Bilateral 04/12/2019   Procedure: RIGHT BREAST RISK REDUCING MASTECTOMY; LEFT MASTECTOMY WITH LEFT AXILLARY SENTINEL LYMPH NODE BIOPSY WITH BLUE DYE INJECTION;  Surgeon: Emelia Loron, MD;  Location: [facility];  Service: General;  Laterality: Bilateral;   MYOMECTOMY     PORT-A-CATH REMOVAL N/A 02/03/2015   Procedure: REMOVAL PORT-A-CATH;  Surgeon: Emelia Loron, MD;  Location: [facility];  Service: General;  Laterality: N/A;   PORT-A-CATH REMOVAL Right 01/24/2020   Procedure: REMOVAL PORT-A-CATH;  Surgeon: Glenna Fellows, MD;  Location: [facility];  Service: Plastics;  Laterality: Right;   PORTACATH PLACEMENT N/A 03/01/2014   Procedure: INSERTION PORT-A-CATH;  Surgeon: Emelia Loron, MD;  Location: [facility];  Service: General;  Laterality: N/A;   PORTACATH PLACEMENT N/A 11/18/2018   Procedure: INSERTION PORT-A-CATH WITH ULTRASOUND;  Surgeon: Emelia Loron, MD;  Location: [facility];  Service: General;  Laterality: N/A;   REMOVAL OF BILATERAL TISSUE EXPANDERS WITH PLACEMENT OF BILATERAL BREAST IMPLANTS Bilateral 06/12/2020   Procedure: REMOVAL OF BILATERAL TISSUE  EXPANDERS WITH PLACEMENT OF BILATERAL BREAST IMPLANTS;  Surgeon: Glenna Fellows, MD;  Location: [facility];  Service: Plastics;  Laterality: Bilateral;   REMOVAL OF TISSUE EXPANDER AND PLACEMENT OF IMPLANT Right 06/02/2019   Procedure: REMOVAL OF TISSUE EXPANDER RIGHT CHEST;  Surgeon: Glenna Fellows, MD;  Location: [facility];  Service: Plastics;  Laterality: Right;   TONSILLECTOMY     TUBAL LIGATION     WOUND DEBRIDEMENT Left 06/02/2019   Procedure: LEFT CHEST DEBRIDEMENT;  Surgeon: Glenna Fellows, MD;  Location: [facility];  Service: Plastics;  Laterality: Left;    SOCIAL HISTORY: Social History   Socioeconomic History   Marital status: Married    Spouse name: Not on file   Number of children: 3   Years of education: Not on file   Highest education level: Some college, no degree  Occupational History   Not on file  Tobacco Use   Smoking status: Former    Current packs/day: 0.50    Average packs/day: 0.5 packs/day for 15.0 years (7.5 ttl pk-yrs)    Types: Cigarettes   Smokeless tobacco: Never   Tobacco comments:    Quit smoking cigarettes in 2002  Vaping Use  Vaping status: Never Used  Substance and Sexual Activity   Alcohol use: Not Currently   Drug use: No   Sexual activity: Not on file  Other Topics Concern   Not on file  Social History Narrative   Not on file   Social Determinants of Health   Financial Resource Strain: Medium Risk (01/07/2023)   Overall Financial Resource Strain (CARDIA)    Difficulty of Paying Living Expenses: Somewhat hard  Food Insecurity: No Food Insecurity (04/23/2023)   Hunger Vital Sign    Worried About Running Out of Food in the Last Year: Never true    Ran Out of Food in the Last Year: Never true  Transportation Needs: No Transportation Needs (04/23/2023)   PRAPARE - Administrator, Civil Service (Medical): No    Lack of Transportation (Non-Medical): No  Physical Activity:  Inactive (01/07/2023)   Exercise Vital Sign    Days of Exercise per Week: 0 days    Minutes of Exercise per Session: 0 min  Stress: No Stress Concern Present (01/07/2023)   [employer] of Occupational Health - Occupational Stress Questionnaire    Feeling of Stress : Not at all  Social Connections: Socially Integrated (01/07/2023)   Social Connection and Isolation Panel [NHANES]    Frequency of Communication with Friends and Family: Twice a week    Frequency of Social Gatherings with Friends and Family: Once a week    Attends Religious Services: More than 4 times per year    Active Member of [employer] or Organizations: Yes    Attends Banker Meetings: 1 to 4 times per year    Marital Status: Married  Catering manager Violence: Not At Risk (04/23/2023)   Humiliation, Afraid, Rape, and Kick questionnaire    Fear of Current or Ex-Partner: No    Emotionally Abused: No    Physically Abused: No    Sexually Abused: No    FAMILY HISTORY: Family History  Problem Relation Age of Onset   Endometrial cancer Mother 70   Hearing loss Mother    Atrial fibrillation Mother    Lung cancer Father    Brain cancer Father    Barrett's esophagus Sister    Heart disease Maternal Aunt    Atrial fibrillation Maternal Aunt    Lung cancer Maternal Uncle    Atrial fibrillation Maternal Uncle    Bladder Cancer Maternal Uncle    Multiple myeloma Maternal Uncle    Stroke Maternal Grandmother    Stroke Maternal Grandfather    Other Son        trisomy 13   Colon polyps Cousin    Colon cancer Neg Hx    Esophageal cancer Neg Hx    Rectal cancer Neg Hx    Stomach cancer Neg Hx        PHYSICAL EXAMINATION  ECOG PERFORMANCE STATUS: 1 - Symptomatic but completely ambulatory  Vitals:   08/15/23 1033  BP: 130/63  Pulse: 62  Resp: 16  Temp: 98.2 F (36.8 C)  SpO2: 98%    Physical Exam Constitutional:      General: She is not in acute distress.    Appearance: Normal appearance. She is  not toxic-appearing.  HENT:     Head: Normocephalic and atraumatic.  Eyes:     General: No scleral icterus. Cardiovascular:     Rate and Rhythm: Normal rate and regular rhythm.     Pulses: Normal pulses.     Heart sounds: Normal heart sounds.  Pulmonary:     Effort: Pulmonary effort is normal.     Breath sounds: Normal breath sounds.  Chest:     Comments: Status post bilateral mastectomies with reconstruction.  No signs of local recurrence.  No regional adenopathy.   Abdominal:     General: Abdomen is flat. Bowel sounds are normal. There is no distension.     Palpations: Abdomen is soft.     Tenderness: There is no abdominal tenderness.  Musculoskeletal:        General: No swelling.     Cervical back: Neck supple.  Lymphadenopathy:     Cervical: No cervical adenopathy.  Skin:    General: Skin is warm and dry.     Findings: No rash.  Neurological:     General: No focal deficit present.     Mental Status: She is alert.  Psychiatric:        Mood and Affect: Mood normal.        Behavior: Behavior normal.     LABORATORY DATA:  CBC    Component Value Date/Time   WBC 6.9 06/17/2023 0959   RBC 4.56 06/17/2023 0959   HGB 13.7 06/17/2023 0959   HGB 13.7 08/08/2022 0909   HGB 13.6 06/19/2017 1312   HCT 41.7 06/17/2023 0959   HCT 40.1 06/19/2017 1312   PLT 313.0 06/17/2023 0959   PLT 280 08/08/2022 0909   PLT 309 06/19/2017 1312   MCV 91.4 06/17/2023 0959   MCV 91.4 06/19/2017 1312   MCH 30.4 04/24/2023 0512   MCHC 32.8 06/17/2023 0959   RDW 13.9 06/17/2023 0959   RDW 13.5 06/19/2017 1312   LYMPHSABS 1.1 04/23/2023 1210   LYMPHSABS 2.0 06/19/2017 1312   MONOABS 0.7 04/23/2023 1210   MONOABS 0.6 06/19/2017 1312   EOSABS 0.2 04/23/2023 1210   EOSABS 0.2 06/19/2017 1312   BASOSABS 0.0 04/23/2023 1210   BASOSABS 0.1 06/19/2017 1312    CMP     Component Value Date/Time   NA 140 06/20/2023 0859   NA 138 01/09/2021 0855   NA 140 06/19/2017 1312   K 3.7 06/20/2023  0859   K 3.5 06/19/2017 1312   CL 102 06/20/2023 0859   CO2 27 06/20/2023 0859   CO2 25 06/19/2017 1312   GLUCOSE 95 06/20/2023 0859   GLUCOSE 82 06/19/2017 1312   GLUCOSE 88 09/22/2006 1056   BUN 11 06/20/2023 0859   BUN 13 01/09/2021 0855   BUN 18.4 06/19/2017 1312   CREATININE 0.67 06/20/2023 0859   CREATININE 0.83 08/08/2022 0909   CREATININE 0.8 06/19/2017 1312   CALCIUM 10.0 06/20/2023 0859   CALCIUM 10.2 06/19/2017 1312   PROT 7.5 06/17/2023 0959   PROT 7.2 03/15/2022 1004   PROT 7.7 06/19/2017 1312   ALBUMIN 4.5 06/17/2023 0959   ALBUMIN 4.6 03/15/2022 1004   ALBUMIN 4.4 06/19/2017 1312   AST 20 06/17/2023 0959   AST 17 08/08/2022 0909   AST 15 06/19/2017 1312   ALT 29 06/17/2023 0959   ALT 21 08/08/2022 0909   ALT 22 06/19/2017 1312   ALKPHOS 82 06/17/2023 0959   ALKPHOS 86 06/19/2017 1312   BILITOT 0.6 06/17/2023 0959   BILITOT 0.6 08/08/2022 0909   BILITOT 0.26 06/19/2017 1312   GFRNONAA >60 04/24/2023 0512   GFRNONAA >60 08/08/2022 0909   GFRAA >60 06/08/2020 1155   GFRAA >60 06/09/2019 1326    ASSESSMENT and THERAPY PLAN:    ASSESSMENT: 62 y.o. [place] woman status post left breast  upper inner quadrant biopsy 02/08/2014 for a clinical T1c N0, stage IA invasive ductal carcinoma, grade 3, triple negative, with an MIB-1 of 50%.   (1) status post left lumpectomy and sentinel lymph node sampling 03/01/2014 for a pT1c pN0, stage IA invasive ductal carcinoma, grade 3, with negative margins, repeat prognostic panel again triple negative.   (2) adjuvant chemotherapy started a 03/25/2014 consisting of cyclophosphamide and doxorubicin given in dose dense fashion x4, completed 05/06/2014; followed by weekly paclitaxel x1, on 05/27/2014, poorly tolerated;              (a) switched to Abraxane starting 06/10/2014, with 11 Abraxane doses planned             (b) dose reduced by 20% because of neutropenia starting 07/01/2014 dose             (c) discontinued after 4th  Abraxane dose 07/15/2014 due to neuropathy   (3) adjuvant radiation completed 10/07/2014.             Left breast with breath hold technique/ 45 Gy at 1.8 Gy per fraction x 25 fractions.               Left breast boost/ 16 Gy at 2 Gy per fraction x 8 fractions   (4) genetics counseling 07/06/2015 through the Breast/Ovarian cancer gene panel offered by [facility] found no deleterious mutations in ATM, BARD1, BRCA1, BRCA2, BRIP1, CDH1, CHEK2, EPCAM, FANCC, MLH1, MSH2, MSH6, NBN, PALB2, PMS2, PTEN, RAD51C, RAD51D, TP53, and XRCC2.     (5) question of sleep apnea   (6) transverse colon lesion noted on CT scan from 06/17/2014 - negative biopsy, also showed fatty liver             (a) repeat CT scan of the abdomen and pelvis with contrast 02/15/2016 negative   RECURRENT DISEASE: February 2020 (7) biopsy of palpable mass upper inner left breast 11/10/2018 confirms invasive ductal carcinoma, grade 2 or 3, triple negative, with an MIB-1 of 80%.             (a) CT scans of the chest abdomen and pelvis with contrast showed no stage IV disease             (b) bone scan 11/13/2018 shows no definite metastasis to bone   (8) neoadjuvant chemotherapy consisting of carboplatin and gemcitabine given days 1 and 8 of each 21-day cycle, for 6 cycles, started 11/26/2018, completed 03/18/2019             (a) repeat ultrasound 01/25/2019 (after 3 cycles) shows no change in measurable breast mass              (9) bilateral mastectomies as follows              (a) left mastectomy and sentinel lymph node sampling with immediate tissue expander placement and latissimus flap 04/12/2019 showed a residual ypT1a ypN0 invasive ductal carcinoma, grade 3, with negative margins; a single sentinel lymph node was removed.  Margins were negative             (b) right mastectomy with immediate implant placement showed no malignancy             (c) right expander removed 06/02/2019 secondary to infectious complications   (10)  molecular testing on the 11/10/2018 biopsy showed negative PD-L1, negative androgen receptor, stable MSI and proficient mismatch repair status.  The tumor mutational burden was low.  No PIK3 mutation was detected.  There was  a pathogenic TP53 variant and negative ER PR and HER-2 were confirmed.   (11) Adjuvant pembrolizumab given every 3 weeks beginning 06/09/2019, discontinued 09/22/2019 with rash   (12) Adjuvant capecitabine 1500mg  PO BID for 14 days on and 7 days off on a 21 day cycle starting 06/30/2019, discontinued January/2021 due to diarrhea     PLAN:  Ear Complaints Reports hearing water in ears and vertigo. MRI scheduled to evaluate for possible acoustic neuroma. Currently on allergy medications and azelastine nasal spray. -Continue current medications. -Follow up with ENT for possible placement of tubes to help drain fluid.  Diverticulitis Two hospitalizations this year with sepsis. Discussed bowel resection with surgeon but patient declined due to recent surgeries. -Continue current management and monitor for changes.  Breast Cancer Triple negative, four years free. Reports pain in the axilla, difficulty wearing bras. -Otherwise there is no evidence of recurrence.  Bilateral breast exam unremarkable.  No role for mammograms since she had bilateral mastectomy.  Follow-up Next appointment scheduled for 2025.  Total time spent: 30 min including History, Physical exam, review of records, counseling and coordination of care. RTC in one yr or sooner as needed.  All questions were answered. The patient knows to call the clinic with any problems, questions or concerns. We can certainly see the patient much sooner if necessary.  Total encounter time:30 minutes*in face-to-face visit time, chart review, lab review, care coordination, order entry, and documentation of the encounter time. *Total Encounter Time as defined by the Centers for Medicare and Medicaid Services includes, in  addition to the face-to-face time of a patient visit (documented in the note above) non-face-to-face time: obtaining and reviewing outside history, ordering and reviewing medications, tests or procedures, care coordination (communications with other health care professionals or caregivers) and documentation in the medical record.

## 2023-09-11 ENCOUNTER — Telehealth: Payer: Self-pay | Admitting: Family Medicine

## 2023-09-11 NOTE — Telephone Encounter (Signed)
Copied from CRM [id number]. Topic: Medicare AWV >> Sep 11, 2023 10:32 AM Payton Doughty wrote: Reason for CRM: Called LVM 09/11/2023 to schedule AWV TELEHEALTH ONLY  Verlee Rossetti; Care Guide Ambulatory Clinical Support [place] [facility] Direct Dial: [phone]

## 2023-09-16 ENCOUNTER — Other Ambulatory Visit (INDEPENDENT_AMBULATORY_CARE_PROVIDER_SITE_OTHER): Payer: BC Managed Care – PPO

## 2023-09-16 DIAGNOSIS — E782 Mixed hyperlipidemia: Secondary | ICD-10-CM | POA: Diagnosis not present

## 2023-09-16 LAB — LIPID PANEL
Cholesterol: 234 mg/dL — ABNORMAL HIGH (ref 0–200)
HDL: 49.2 mg/dL (ref >39.00)
LDL Cholesterol: 154 mg/dL — ABNORMAL HIGH (ref 0–99)
NonHDL: 184.32
Total CHOL/HDL Ratio: 5
Triglycerides: 154 mg/dL — ABNORMAL HIGH (ref 0.0–149.0)
VLDL: 30.8 mg/dL (ref 0.0–40.0)

## 2023-10-14 ENCOUNTER — Ambulatory Visit (INDEPENDENT_AMBULATORY_CARE_PROVIDER_SITE_OTHER): Payer: BC Managed Care – PPO

## 2023-10-14 VITALS — Ht 65.0 in | Wt 165.0 lb

## 2023-10-14 DIAGNOSIS — Z0000 Encounter for general adult medical examination without abnormal findings: Secondary | ICD-10-CM

## 2023-10-14 NOTE — Progress Notes (Signed)
 Subjective:   Alexandria Bradley is a 63 y.o. female who presents for Medicare Annual (Subsequent) preventive examination.  Visit Complete: Virtual I connected with  Alexandria Bradley on 10/14/23 by a audio enabled telemedicine application and verified that I am speaking with the correct person using two identifiers.  Patient Location: Home  Provider Location: Home Office  I discussed the limitations of evaluation and management by telemedicine. The patient expressed understanding and agreed to proceed.  Vital Signs: Because this visit was a virtual/telehealth visit, some criteria may be missing or patient reported. Any vitals not documented were not able to be obtained and vitals that have been documented are patient reported.  Patient Medicare AWV questionnaire was completed by the patient on 10/13/23; I have confirmed that all information answered by patient is correct and no changes since this date.  Cardiac Risk Factors include: advanced age (>52men, >56 women);hypertension     Objective:    Today's Vitals   10/14/23 1320  Weight: 165 lb (74.8 kg)  Height: 5' 5 (1.651 m)   Body mass index is 27.46 kg/m.     10/14/2023    1:26 PM 04/23/2023    6:29 PM 04/23/2023   11:37 AM 01/09/2023    7:20 AM 09/18/2022    2:41 PM 08/08/2020    9:16 AM 06/12/2020   10:37 AM  Advanced Directives  Does Patient Have a Medical Advance Directive? No  No No Yes No No  Type of Agricultural Consultant;Living will    Copy of Healthcare Power of Attorney in Chart?     No - copy requested    Would patient like information on creating a medical advance directive? No - Patient declined No - Patient declined  No - Patient declined  No - Patient declined No - Patient declined    Current Medications (verified) Outpatient Encounter Medications as of 10/14/2023  Medication Sig   albuterol  (VENTOLIN  HFA) 108 (90 Base) MCG/ACT inhaler Inhale 2 puffs into the lungs every 6  (six) hours as needed for wheezing or shortness of breath (Cough).   allopurinol  (ZYLOPRIM ) 100 MG tablet TAKE 1 TABLET BY MOUTH EVERY DAY   azelastine  (ASTELIN ) 0.1 % nasal spray Place 2 sprays into both nostrils 2 (two) times daily. Use in each nostril as directed   betamethasone dipropionate 0.05 % cream Apply 1 Application topically daily.   diltiazem  (CARDIZEM  CD) 240 MG 24 hr capsule TAKE 1 CAPSULE BY MOUTH EVERY DAY   fexofenadine (ALLEGRA) 180 MG tablet Take 180 mg by mouth daily.   Levothyroxine  Sodium (TIROSINT ) 150 MCG CAPS Take 150 mcg by mouth daily before breakfast.   liothyronine  (CYTOMEL ) 5 MCG tablet Take 5 mcg by mouth daily before breakfast.    mometasone  (NASONEX ) 50 MCG/ACT nasal spray Place 2 sprays into the nose daily as needed (allergies).   potassium chloride  (KLOR-CON  10) 10 MEQ tablet Take 1 tablet (10 mEq total) by mouth daily.   triamterene -hydrochlorothiazide  (MAXZIDE -25) 37.5-25 MG tablet Take 1 tablet by mouth daily.   [DISCONTINUED] dicyclomine  (BENTYL ) 10 MG capsule Take 1 capsule (10 mg total) by mouth 3 (three) times daily as needed for spasms.   [DISCONTINUED] prochlorperazine  (COMPAZINE ) 10 MG tablet Take 1 tablet (10 mg total) by mouth every 6 (six) hours as needed (Nausea or vomiting).   [DISCONTINUED] traMADol  (ULTRAM ) 50 MG tablet Take 1 tablet (50 mg total) by mouth every 6 (six) hours as needed for moderate pain.  No facility-administered encounter medications on file as of 10/14/2023.    Allergies (verified) Chlorhexidine , Ciprofloxacin , Prednisone , Codeine, Cortisone, and Colchicine    History: Past Medical History:  Diagnosis Date   Allergy    Anxiety    Arthritis    Back pain    Breast cancer (HCC)    left   Fatty liver    GERD (gastroesophageal reflux disease)    Gout    Headache(784.0)    PMH : migraines   History of kidney stones    Hypercholesterolemia    Hypertension    Hypothyroidism    Osteoporosis    Personal history of  chemotherapy    Personal history of radiation therapy    PONV (postoperative nausea and vomiting)    difficult to wake up   Pre-diabetes    Psoriasis    PVC's (premature ventricular contractions)    Radiation 08/18/14-10/07/14   Left breast   Sleep apnea    no CPAP   Past Surgical History:  Procedure Laterality Date   BREAST BIOPSY Left 11/10/2018   BREAST LUMPECTOMY Left 2015   BREAST LUMPECTOMY WITH AXILLARY LYMPH NODE BIOPSY  02/2014   left   BREAST RECONSTRUCTION WITH PLACEMENT OF TISSUE EXPANDER AND ALLODERM Bilateral 04/12/2019   Procedure: BILATERAL BREAST RECONSTRUCTION WITH PLACEMENT OF TISSUE EXPANDERS; ALLODERM TO RIGHT CHEST;  Surgeon: Arelia Filippo, MD;  Location: [facility] OR;  Service: Plastics;  Laterality: Bilateral;   BREAST RECONSTRUCTION WITH PLACEMENT OF TISSUE EXPANDER AND ALLODERM Right 01/24/2020   Procedure: BREAST RECONSTRUCTION WITH PLACEMENT OF TISSUE EXPANDER AND ALLODERM;  Surgeon: Arelia Filippo, MD;  Location: [facility];  Service: Plastics;  Laterality: Right;   CARDIAC CATHETERIZATION     CHOLECYSTECTOMY     colon polyps     COLONOSCOPY N/A 06/27/2014   Procedure: COLONOSCOPY;  Surgeon: Jerrell KYM Sol, MD;  Location: [facility] END[facility]COPY;  Service: Endoscopy;  Laterality: N/A;   COLONOSCOPY  02/2020   LATISSIMUS FLAP TO BREAST Left 04/12/2019   Procedure: LEFT LATISSIMUS DORSI FLAP TO LEFT CHEST;  Surgeon: Arelia Filippo, MD;  Location: [facility] OR;  Service: Plastics;  Laterality: Left;   LEFT HEART CATH AND CORONARY ANGIOGRAPHY N/A 07/24/2018   Procedure: LEFT HEART CATH AND CORONARY ANGIOGRAPHY;  Surgeon: Burnard Debby LABOR, MD;  Location: [facility];  Service: Cardiovascular;  Laterality: N/A;   LIPOMA EXCISION     LIPOSUCTION WITH LIPOFILLING Bilateral 06/12/2020   Procedure: LIPOFILLING TO BILATERAL CHEST;  Surgeon: Arelia Filippo, MD;  Location: [facility];  Service: Plastics;  Laterality: Bilateral;    MASTECTOMY W/ SENTINEL NODE BIOPSY Bilateral 04/12/2019   Procedure: RIGHT BREAST RISK REDUCING MASTECTOMY; LEFT MASTECTOMY WITH LEFT AXILLARY SENTINEL LYMPH NODE BIOPSY WITH BLUE DYE INJECTION;  Surgeon: Ebbie Cough, MD;  Location: [facility];  Service: General;  Laterality: Bilateral;   MYOMECTOMY     PORT-A-CATH REMOVAL N/A 02/03/2015   Procedure: REMOVAL PORT-A-CATH;  Surgeon: Cough Ebbie, MD;  Location: [facility];  Service: General;  Laterality: N/A;   PORT-A-CATH REMOVAL Right 01/24/2020   Procedure: REMOVAL PORT-A-CATH;  Surgeon: Arelia Filippo, MD;  Location: [facility];  Service: Plastics;  Laterality: Right;   PORTACATH PLACEMENT N/A 03/01/2014   Procedure: INSERTION PORT-A-CATH;  Surgeon: Cough Ebbie, MD;  Location: [facility];  Service: General;  Laterality: N/A;   PORTACATH PLACEMENT N/A 11/18/2018   Procedure: INSERTION PORT-A-CATH WITH ULTRASOUND;  Surgeon: Ebbie Cough, MD;  Location: [facility] OR;  Service: General;  Laterality: N/A;   REMOVAL OF BILATERAL TISSUE EXPANDERS WITH PLACEMENT OF BILATERAL BREAST IMPLANTS Bilateral 06/12/2020   Procedure: REMOVAL OF BILATERAL TISSUE EXPANDERS WITH PLACEMENT OF BILATERAL BREAST IMPLANTS;  Surgeon: Arelia Filippo, MD;  Location: [facility];  Service: Plastics;  Laterality: Bilateral;   REMOVAL OF TISSUE EXPANDER AND PLACEMENT OF IMPLANT Right 06/02/2019   Procedure: REMOVAL OF TISSUE EXPANDER RIGHT CHEST;  Surgeon: Arelia Filippo, MD;  Location: [facility];  Service: Plastics;  Laterality: Right;   TONSILLECTOMY     TUBAL LIGATION     WOUND DEBRIDEMENT Left 06/02/2019   Procedure: LEFT CHEST DEBRIDEMENT;  Surgeon: Arelia Filippo, MD;  Location: [facility];  Service: Plastics;  Laterality: Left;   Family History  Problem Relation Age of Onset   Endometrial cancer Mother 4   Hearing loss Mother    Atrial fibrillation  Mother    Lung cancer Father    Brain cancer Father    Barrett's esophagus Sister    Heart disease Maternal Aunt    Atrial fibrillation Maternal Aunt    Lung cancer Maternal Uncle    Atrial fibrillation Maternal Uncle    Bladder Cancer Maternal Uncle    Multiple myeloma Maternal Uncle    Stroke Maternal Grandmother    Stroke Maternal Grandfather    Other Son        trisomy 13   Colon polyps Cousin    Colon cancer Neg Hx    Esophageal cancer Neg Hx    Rectal cancer Neg Hx    Stomach cancer Neg Hx    Social History   Socioeconomic History   Marital status: Married    Spouse name: Not on file   Number of children: 3   Years of education: Not on file   Highest education level: Some college, no degree  Occupational History   Not on file  Tobacco Use   Smoking status: Former    Current packs/day: 0.50    Average packs/day: 0.5 packs/day for 15.0 years (7.5 ttl pk-yrs)    Types: Cigarettes   Smokeless tobacco: Never   Tobacco comments:    Quit smoking cigarettes in 2002  Vaping Use   Vaping status: Never Used  Substance and Sexual Activity   Alcohol use: Not Currently   Drug use: No   Sexual activity: Not on file  Other Topics Concern   Not on file  Social History Narrative   Not on file   Social Drivers of Health   Financial Resource Strain: Low Risk  (10/14/2023)   Overall Financial Resource Strain (CARDIA)    Difficulty of Paying Living Expenses: Not hard at all  Food Insecurity: No Food Insecurity (10/14/2023)   Hunger Vital Sign    Worried About Running Out of Food in the Last Year: Never true    Ran Out of Food in the Last Year: Never true  Transportation Needs: No Transportation Needs (10/14/2023)   PRAPARE - Administrator, Civil Service (Medical): No    Lack of Transportation (Non-Medical): No  Physical Activity: Inactive (10/14/2023)   Exercise Vital Sign    Days of Exercise per Week: 0 days    Minutes of Exercise per Session: 0 min  Stress:  No Stress Concern Present (10/14/2023)   [employer] of Occupational Health - Occupational Stress Questionnaire    Feeling of Stress : Not at all  Social Connections: Socially Integrated (10/14/2023)   Social Connection and Isolation Panel [NHANES]  Frequency of Communication with Friends and Family: More than three times a week    Frequency of Social Gatherings with Friends and Family: More than three times a week    Attends Religious Services: More than 4 times per year    Active Member of [employer] or Organizations: Yes    Attends Engineer, Structural: More than 4 times per year    Marital Status: Married    Tobacco Counseling Counseling given: Not Answered Tobacco comments: Quit smoking cigarettes in 2002   Clinical Intake:  Pre-visit preparation completed: Yes  Pain : No/denies pain     BMI - recorded: 27.46 Nutritional Status: BMI 25 -29 Overweight Nutritional Risks: None Diabetes: No  How often do you need to have someone help you when you read instructions, pamphlets, or other written materials from your doctor or pharmacy?: 1 - Never  Interpreter Needed?: No  Information entered by :: Rojelio Blush LPN   Activities of Daily Living    10/14/2023    1:26 PM 10/13/2023    1:27 PM  In your present state of health, do you have any difficulty performing the following activities:  Hearing? 0 0  Vision? 0 0  Difficulty concentrating or making decisions? 0 0  Walking or climbing stairs? 0 0  Dressing or bathing? 0 0  Doing errands, shopping? 0 0  Preparing Food and eating ? N N  Using the Toilet? N N  In the past six months, have you accidently leaked urine? N Y  Do you have problems with loss of bowel control? N N  Managing your Medications? N N  Managing your Finances? N N  Housekeeping or managing your Housekeeping? N N    Patient Care Team: [name], DO as PCP - General (Family Medicine) Delford Maude BROCKS, MD as PCP - Cardiology  (Cardiology) Gorge Ade, MD as Consulting Physician (Obstetrics and Gynecology) Ebbie Cough, MD as Consulting Physician (General Surgery) Faythe Purchase, MD as Consulting Physician (Endocrinology) Yvone Rush, MD as Consulting Physician (Orthopedic Surgery) Delford Maude BROCKS, MD as Consulting Physician (Cardiology) Arelia Filippo, MD as Consulting Physician (Plastic Surgery) Aneita Gwendlyn DASEN, MD (Inactive) as Consulting Physician (Gastroenterology)  Indicate any recent Medical Services you may have received from other than [facility] providers in the past year (date may be approximate).     Assessment:   This is a routine wellness examination for [place].  Hearing/Vision screen Hearing Screening - Comments:: Denies hearing difficulties   Vision Screening - Comments:: Wears rx glasses - up to date with routine [facility] exams with  [facility]   Goals Addressed               This Visit's Progress     Increase physical activity (pt-stated)         Depression Screen    10/14/2023    1:25 PM 06/17/2023    9:28 AM 01/17/2023   11:18 AM 01/17/2023   11:17 AM 10/23/2022    2:16 PM 10/04/2022    1:23 PM 09/18/2022    2:39 PM  PHQ 2/9 Scores  PHQ - 2 Score 0 0 0 0 0 0 0  PHQ- 9 Score  0 0        Fall Risk    10/14/2023    1:26 PM 10/13/2023    1:27 PM 06/17/2023    9:28 AM 01/17/2023   11:17 AM 10/23/2022    2:16 PM  Fall Risk   Falls in the past  year? 0 0 0 0 0  Number falls in past yr: 0 0 0 0 0  Injury with Fall? 0 0 0 0 0  Risk for fall due to : No Fall Risks  No Fall Risks    Follow up Falls prevention discussed  Falls evaluation completed Falls evaluation completed Falls evaluation completed    MEDICARE RISK AT HOME: Medicare Risk at Home Any stairs in or around the home?: (Patient-Rptd) Yes If so, are there any without handrails?: (Patient-Rptd) Yes Home free of loose throw rugs in walkways, pet beds, electrical cords, etc?: (Patient-Rptd) No Adequate lighting in  your home to reduce risk of falls?: (Patient-Rptd) Yes Life alert?: (Patient-Rptd) No Use of a cane, walker or w/c?: (Patient-Rptd) No Grab bars in the bathroom?: (Patient-Rptd) No Shower chair or bench in shower?: (Patient-Rptd) No Elevated toilet seat or a handicapped toilet?: (Patient-Rptd) No  TIMED UP AND GO:  Was the test performed?  No    Cognitive Function:        10/14/2023    1:27 PM 09/18/2022    2:43 PM  6CIT Screen  What Year? 0 points 0 points  What month? 0 points 0 points  What time? 0 points 0 points  Count back from 20 0 points 0 points  Months in reverse 0 points 0 points  Repeat phrase 0 points 0 points  Total Score 0 points 0 points    Immunizations Immunization History  Administered Date(s) Administered   PFIZER(Purple Top)SARS-COV-2 Vaccination 02/14/2020, 03/06/2020   Tdap 05/28/2022   Zoster Recombinant(Shingrix) 05/30/2018    TDAP status: Up to date  Flu Vaccine status: Declined, Education has been provided regarding the importance of this vaccine but patient still declined. Advised may receive this vaccine at local pharmacy or Health Dept. Aware to provide a copy of the vaccination record if obtained from local pharmacy or Health Dept. Verbalized acceptance and understanding.  Pneumococcal vaccine status: Due, Education has been provided regarding the importance of this vaccine. Advised may receive this vaccine at local pharmacy or Health Dept. Aware to provide a copy of the vaccination record if obtained from local pharmacy or Health Dept. Verbalized acceptance and understanding.      Screening Tests Health Maintenance  Topic Date Due   Pneumococcal Vaccine 74-49 Years old (1 of 2 - PCV) Never done   Cervical Cancer Screening (HPV/Pap Cotest)  Never done   INFLUENZA VACCINE  12/29/2023 (Originally 05/01/2023)   Diabetic kidney evaluation - Urine ACR  12/17/2027 (Originally 12/24/1978)   Diabetic kidney evaluation - eGFR measurement   06/19/2024   Medicare Annual Wellness (AWV)  10/13/2024   DTaP/Tdap/Td (2 - Td or Tdap) 05/28/2032   Colonoscopy  06/09/2033   Hepatitis C Screening  Completed   HIV Screening  Completed   HPV VACCINES  Aged Out   FOOT EXAM  Discontinued   MAMMOGRAM  Discontinued   HEMOGLOBIN A1C  Discontinued   OPHTHALMOLOGY EXAM  Discontinued   COVID-19 Vaccine  Discontinued   Zoster Vaccines- Shingrix  Discontinued    Health Maintenance  Health Maintenance Due  Topic Date Due   Pneumococcal Vaccine 26-63 Years old (1 of 2 - PCV) Never done   Cervical Cancer Screening (HPV/Pap Cotest)  Never done    Colorectal cancer screening: Type of screening: Colonoscopy. Completed 06/10/23. Repeat every 10 years         Additional Screening:  Hepatitis C Screening: does qualify; Completed 01/06/08  Vision Screening: Recommended annual ophthalmology exams for  early detection of glaucoma and other disorders of the eye. Is the patient up to date with their annual eye exam?  Yes  Who is the provider or what is the name of the office in which the patient attends annual eye exams? [facility] If pt is not established with a provider, would they like to be referred to a provider to establish care? No .   Dental Screening: Recommended annual dental exams for proper oral hygiene    Community Resource Referral / Chronic Care Management:  CRR required this visit?  No   CCM required this visit?  No     Plan:     I have personally reviewed and noted the following in the patient's chart:   Medical and social history Use of alcohol, tobacco or illicit drugs  Current medications and supplements including opioid prescriptions. Patient is not currently taking opioid prescriptions. Functional ability and status Nutritional status Physical activity Advanced directives List of other physicians Hospitalizations, surgeries, and ER visits in previous 12 months Vitals Screenings to include cognitive,  depression, and falls Referrals and appointments  In addition, I have reviewed and discussed with patient certain preventive protocols, quality metrics, and best practice recommendations. A written personalized care plan for preventive services as well as general preventive health recommendations were provided to patient.     Rojelio LELON Blush, LPN   [id number]   After Visit Summary: (MyChart) Due to this being a telephonic visit, the after visit summary with patients personalized plan was offered to patient via MyChart   Nurse Notes: None

## 2023-10-14 NOTE — Patient Instructions (Addendum)
 Ms. Alexandria Bradley , Thank you for taking time to come for your Medicare Wellness Visit. I appreciate your ongoing commitment to your health goals. Please review the following plan we discussed and let me know if I can assist you in the future.   Referrals/Orders/Follow-Ups/Clinician Recommendations:   This is a list of the screening recommended for you and due dates:  Health Maintenance  Topic Date Due   Pneumococcal Vaccination (1 of 2 - PCV) Never done   Pap with HPV screening  Never done   Flu Shot  12/29/2023*   Yearly kidney health urinalysis for diabetes  12/17/2027*   Yearly kidney function blood test for diabetes  06/19/2024   Medicare Annual Wellness Visit  10/13/2024   DTaP/Tdap/Td vaccine (2 - Td or Tdap) 05/28/2032   Colon Cancer Screening  06/09/2033   Hepatitis C Screening  Completed   HIV Screening  Completed   HPV Vaccine  Aged Out   Complete foot exam   Discontinued   Mammogram  Discontinued   Hemoglobin A1C  Discontinued   Eye exam for diabetics  Discontinued   COVID-19 Vaccine  Discontinued   Zoster (Shingles) Vaccine  Discontinued  *Topic was postponed. The date shown is not the original due date.    Advanced directives: (Declined) Advance directive discussed with you today. Even though you declined this today, please call our office should you change your mind, and we can give you the proper paperwork for you to fill out.  Next Medicare Annual Wellness Visit scheduled for next year: Yes

## 2023-11-06 ENCOUNTER — Telehealth (INDEPENDENT_AMBULATORY_CARE_PROVIDER_SITE_OTHER): Payer: Self-pay | Admitting: Otolaryngology

## 2023-11-06 NOTE — Telephone Encounter (Signed)
 LVM to confirm appt & location [phone] afm

## 2023-11-07 ENCOUNTER — Ambulatory Visit (INDEPENDENT_AMBULATORY_CARE_PROVIDER_SITE_OTHER): Payer: Medicare Other | Admitting: Otolaryngology

## 2023-11-07 VITALS — BP 134/81 | HR 73

## 2023-11-07 DIAGNOSIS — H6990 Unspecified Eustachian tube disorder, unspecified ear: Secondary | ICD-10-CM

## 2023-11-07 DIAGNOSIS — H6993 Unspecified Eustachian tube disorder, bilateral: Secondary | ICD-10-CM

## 2023-11-07 DIAGNOSIS — R42 Dizziness and giddiness: Secondary | ICD-10-CM

## 2023-11-07 DIAGNOSIS — H905 Unspecified sensorineural hearing loss: Secondary | ICD-10-CM | POA: Diagnosis not present

## 2023-11-07 DIAGNOSIS — H903 Sensorineural hearing loss, bilateral: Secondary | ICD-10-CM

## 2023-11-07 NOTE — Progress Notes (Signed)
 Dear Dr. Frann, Here is my assessment for our mutual patient, Alexandria Bradley. Thank you for allowing me the opportunity to care for your patient. Please do not hesitate to contact me should you have any other questions. Sincerely, Dr. Eldora Blanch  Otolaryngology Clinic Note Referring provider: Dr. Frann HPI:  Alexandria Bradley is a 63 y.o. female kindly referred by Dr. Frann for evaluation of bilateral ear fullness or muffled hearing. Initial visit (08/2023): She feels like for about the last year and a half, she would have some fullness in both of her ears, some popping/muffling, and feels like she is under water. She feels like it is worst during Allergy season, but was consistent for a few months last year. She reports that she felt good in April/May/June but came back after then. Then seemed to get better, and now more so the left ear recently. No antecedent event She reports that right ear is better hearing ear currently. Does have some pressure sensitivity and some muffled hearing b/l. Have not otherwise noticed if hearing has gone down. She has some tinnitus, but has only happened over last 2 years She takes Allegra, Sudafed, and Mometasone /Nasonex  spray. Does have history of Migraines - has visual auras and some auras. Cold/dark room helps.  She had an episode of labyrinthitis in 2017 when she saw Dr. Carlie. She does not remember if her hearing is down at that point. She had a similar episode that lasted for a few days, and then got better on its own. She does Epley when she does this, and it helps sometimes. Not having any issues or episodes  She is already on dyazide  for HTN  Patient currently denies: ear pain, drainage. 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 Did not think meniere's or migraine and sx well controlled so monitored. Also got MRI IAC  She reports that her ear symptoms have gotten better, including fullness, feeling of water. She reports that she is on allegra, sudafed, nasonex  and astelin . She does note astelin  tastes quite bad. No significant vertigo episodes - intermittent episodes with turning in the bed. Continues to have some left sided sinonasal congestion/pressure. We went over the MRI. ---------------------------------------------------------- H&N Surgery: tonsillectomy as child Personal or FHx of bleeding dz or anesthesia difficulty: no  GLP-1: no AP/AC: no  Tobacco: quit 25 years ago, off and on. Occupation: print production planner. Lives in [place].  PMHx: HTN, Breast Cancer (s/p mastectomies, Chemo/Rads and did get carboplatin ), Allergies, GERD, HTN, OSA, Hypothyroidism  Independent Review of Additional Tests or Records:  Dr. Gena notes: She continues to have fullness in both of her ears. The right will hurt intermittently and the left one will feel like there is water sloshing around. She does not use Q-tips. She is not having any drainage or fevers. She has tried nasal, Allegra, and a Medrol  Dosepak. Nothing has helped. She saw ENT 7 years ago but has not seen them since then.   Dr. Carlie 2017: Vertigo; Dx with viral labyrinthitis  08/07/23 Audiogram was independently reviewed and interpreted by me and I agree with read: Asymmetric SNHL; Type A/A tymps --- left is worse from [phone] Hz and right is worse from 4000-8000 Hz.  Pure tone Audiometry: Right ear- Normal to moderate sensorineural hearing loss from 250 Hz - 8000 Hz. Left ear-  Normal to mild sensorineural  hearing loss from [phone] Hz   WRT 96% at 65 dB b/l    SNHL= Sensorineural hearing loss  MRI IAC 08/15/2023 independently interpreted: agree with read, no CPA/IAC mass, mastoids well  aerated.  PMH/Meds/All/SocHx/FamHx/ROS:   Past Medical History:  Diagnosis Date   Allergy    Anxiety    Arthritis    Back pain    Breast cancer (HCC)    left   Fatty liver    GERD (gastroesophageal reflux disease)    Gout    Headache(784.0)    PMH : migraines   History of kidney stones    Hypercholesterolemia    Hypertension    Hypothyroidism    Osteoporosis    Personal history of chemotherapy    Personal history of radiation therapy    PONV (postoperative nausea and vomiting)    difficult to wake up   Pre-diabetes    Psoriasis    PVC's (premature ventricular contractions)    Radiation 08/18/14-10/07/14   Left breast   Sleep apnea    no CPAP     Past Surgical History:  Procedure Laterality Date   BREAST BIOPSY Left 11/10/2018   BREAST LUMPECTOMY Left 2015   BREAST LUMPECTOMY WITH AXILLARY LYMPH NODE BIOPSY  02/2014   left   BREAST RECONSTRUCTION WITH PLACEMENT OF TISSUE EXPANDER AND ALLODERM Bilateral 04/12/2019   Procedure: BILATERAL BREAST RECONSTRUCTION WITH PLACEMENT OF TISSUE EXPANDERS; ALLODERM TO RIGHT CHEST;  Surgeon: Arelia Filippo, MD;  Location: [facility] OR;  Service: Plastics;  Laterality: Bilateral;   BREAST RECONSTRUCTION WITH PLACEMENT OF TISSUE EXPANDER AND ALLODERM Right 01/24/2020   Procedure: BREAST RECONSTRUCTION WITH PLACEMENT OF TISSUE EXPANDER AND ALLODERM;  Surgeon: Arelia Filippo, MD;  Location: [facility];  Service: Plastics;  Laterality: Right;   CARDIAC CATHETERIZATION     CHOLECYSTECTOMY     colon polyps     COLONOSCOPY N/A 06/27/2014   Procedure: COLONOSCOPY;  Surgeon: Jerrell KYM Sol, MD;  Location: [facility];  Service: Endoscopy;  Laterality: N/A;   COLONOSCOPY  02/2020   LATISSIMUS FLAP TO BREAST Left 04/12/2019   Procedure: LEFT LATISSIMUS DORSI FLAP TO LEFT CHEST;  Surgeon: Arelia Filippo, MD;  Location: [facility] OR;  Service: Plastics;  Laterality: Left;   LEFT HEART CATH AND CORONARY ANGIOGRAPHY N/A 07/24/2018    Procedure: LEFT HEART CATH AND CORONARY ANGIOGRAPHY;  Surgeon: Burnard Debby LABOR, MD;  Location: [facility];  Service: Cardiovascular;  Laterality: N/A;   LIPOMA EXCISION     LIPOSUCTION WITH LIPOFILLING Bilateral 06/12/2020   Procedure: LIPOFILLING TO BILATERAL CHEST;  Surgeon: Arelia Filippo, MD;  Location: [facility];  Service: Plastics;  Laterality: Bilateral;   MASTECTOMY W/ SENTINEL NODE BIOPSY Bilateral 04/12/2019   Procedure: RIGHT BREAST RISK REDUCING MASTECTOMY; LEFT MASTECTOMY WITH LEFT AXILLARY SENTINEL LYMPH NODE BIOPSY WITH BLUE DYE INJECTION;  Surgeon: Ebbie Cough, MD;  Location: [facility];  Service: General;  Laterality: Bilateral;   MYOMECTOMY     PORT-A-CATH REMOVAL N/A 02/03/2015   Procedure: REMOVAL PORT-A-CATH;  Surgeon: Cough Ebbie, MD;  Location: [facility];  Service: General;  Laterality: N/A;   PORT-A-CATH REMOVAL Right 01/24/2020   Procedure: REMOVAL PORT-A-CATH;  Surgeon: Arelia Filippo, MD;  Location: [facility];  Service: Plastics;  Laterality: Right;   PORTACATH PLACEMENT N/A 03/01/2014   Procedure: INSERTION PORT-A-CATH;  Surgeon: Cough Ebbie, MD;  Location:  SURGERY CENTER;  Service: General;  Laterality: N/A;   PORTACATH PLACEMENT N/A 11/18/2018  Procedure: INSERTION PORT-A-CATH WITH ULTRASOUND;  Surgeon: Ebbie Cough, MD;  Location[facility] OR;  Service: General;  Laterality: N/A;   REMOVAL OF BILATERAL TISSUE EXPANDERS WITH PLACEMENT OF BILATERAL BREAST IMPLANTS Bilateral 06/12/2020   Procedure: REMOVAL OF BILATERAL TISSUE EXPANDERS WITH PLACEMENT OF BILATERAL BREAST IMPLANTS;  Surgeon: Arelia Filippo, MD;  Location: [facility];  Service: Plastics;  Laterality: Bilateral;   REMOVAL OF TISSUE EXPANDER AND PLACEMENT OF IMPLANT Right 06/02/2019   Procedure: REMOVAL OF TISSUE EXPANDER RIGHT CHEST;  Surgeon: Arelia Filippo, MD;  Location: [facility];  Service: Plastics;  Laterality: Right;   TONSILLECTOMY     TUBAL LIGATION     WOUND DEBRIDEMENT Left 06/02/2019   Procedure: LEFT CHEST DEBRIDEMENT;  Surgeon: Arelia Filippo, MD;  Location: [facility];  Service: Plastics;  Laterality: Left;    Family History  Problem Relation Age of Onset   Endometrial cancer Mother 65   Hearing loss Mother    Atrial fibrillation Mother    Lung cancer Father    Brain cancer Father    Barrett's esophagus Sister    Heart disease Maternal Aunt    Atrial fibrillation Maternal Aunt    Lung cancer Maternal Uncle    Atrial fibrillation Maternal Uncle    Bladder Cancer Maternal Uncle    Multiple myeloma Maternal Uncle    Stroke Maternal Grandmother    Stroke Maternal Grandfather    Other Son        trisomy 13   Colon polyps Cousin    Colon cancer Neg Hx    Esophageal cancer Neg Hx    Rectal cancer Neg Hx    Stomach cancer Neg Hx      Social Connections: Socially Integrated (10/14/2023)   Social Connection and Isolation Panel [NHANES]    Frequency of Communication with Friends and Family: More than three times a week    Frequency of Social Gatherings with Friends and Family: More than three times a week    Attends Religious Services: More than 4 times per year    Active Member of [employer] or Organizations: Yes    Attends Engineer, Structural: More than 4 times per year    Marital Status: Married      Current Outpatient Medications:    albuterol  (VENTOLIN  HFA) 108 (90 Base) MCG/ACT inhaler, Inhale 2 puffs into the lungs every 6 (six) hours as needed for wheezing or shortness of breath (Cough)., Disp: 18 g, Rfl: 0   allopurinol  (ZYLOPRIM ) 100 MG tablet, TAKE 1 TABLET BY MOUTH EVERY DAY, Disp: 90 tablet, Rfl: 2   azelastine  (ASTELIN ) 0.1 % nasal spray, Place 2 sprays into both nostrils 2 (two) times daily. Use in each nostril as directed, Disp: 30 mL, Rfl: 12   betamethasone dipropionate 0.05 % cream, Apply 1  Application topically daily., Disp: , Rfl:    diltiazem  (CARDIZEM  CD) 240 MG 24 hr capsule, TAKE 1 CAPSULE BY MOUTH EVERY DAY, Disp: 90 capsule, Rfl: 3   fexofenadine (ALLEGRA) 180 MG tablet, Take 180 mg by mouth daily., Disp: , Rfl:    Levothyroxine  Sodium (TIROSINT ) 150 MCG CAPS, Take 150 mcg by mouth daily before breakfast., Disp: , Rfl:    liothyronine  (CYTOMEL ) 5 MCG tablet, Take 5 mcg by mouth daily before breakfast. , Disp: , Rfl:    mometasone  (NASONEX ) 50 MCG/ACT nasal spray, Place 2 sprays into the nose daily as needed (allergies)., Disp: , Rfl:    potassium chloride  (KLOR-CON  10)  10 MEQ tablet, Take 1 tablet (10 mEq total) by mouth daily., Disp: 90 tablet, Rfl: 2   triamterene -hydrochlorothiazide  (MAXZIDE -25) 37.5-25 MG tablet, Take 1 tablet by mouth daily., Disp: 90 tablet, Rfl: 3   Physical Exam:   There were no vitals taken for this visit.  Salient findings:  CN II-XII intact  Bilateral EAC clear and TM intact with well pneumatized middle ear spaces Weber 512: midline Rinne 512: AC > BC b/l  Rine 1024: AC > BC b/l  Normal gait; no obvious nystagmus on EOM Anterior rhinoscopy: Septum relatively midline; bilateral inferior turbinates without significant hypertrophy No respiratory distress or stridor  Seprately Identifiable Procedures:  None  Impression & Plans:  Alexandria Bradley is a 63 y.o. female with h/o HTN (on Dyazide ), Breast cancer (received carboplatin  prior), and Allergic rhinitis now with:  Bilateral ear fullness/popping and hearing loss - eustachian tube dysfunction; significantly improved/resolved Asymmetric SNHL - stable History of Labyrinthitis with intermittent vertigo - stable - Her symptoms from fullness/popping standpoint appear to be consistent with ETD b/l. Now resolved on nasonex /PO antihistamine/astelin  - From vertigo standpoint, she is doing very well. No episodes since visit; she could have meniere's on left given low freq differences, but her  episodes are not consistent with it. She has also had Labyrinthitis before (Dr. Carlie diagnosed her in 2017). These episodes do not feel like Migraine for her. She's already on dyazide  and the symptoms are not very frequent and no episodes over last 4 months.  - we discussed f/u incl repeat audio, she'd like to f/u PRN - advised to call in case of any further episodes  See below regarding exact medications prescribed this encounter including dosages and route: No orders of the defined types were placed in this encounter.  Thank you for allowing me the opportunity to care for your patient. Please do not hesitate to contact me should you have any other questions.  Sincerely, Eldora Blanch, MD Otolarynoglogist (ENT), [facility] Phone: [phone] Fax: [phone]  11/07/2023, 11:28 AM   MDM:  Level 4: 99214 Complexity/Problems addressed: mod - multiple chronic problems Data complexity: mod - independent interpretation of imaging - Morbidity: low  - Prescription Drug prescribed or managed: no

## 2023-11-08 ENCOUNTER — Encounter (INDEPENDENT_AMBULATORY_CARE_PROVIDER_SITE_OTHER): Payer: Self-pay

## 2024-01-27 ENCOUNTER — Ambulatory Visit (INDEPENDENT_AMBULATORY_CARE_PROVIDER_SITE_OTHER): Admitting: Family Medicine

## 2024-01-27 ENCOUNTER — Encounter: Payer: Self-pay | Admitting: Family Medicine

## 2024-01-27 VITALS — BP 128/74 | HR 72 | Temp 98.0°F | Resp 16 | Ht 65.0 in | Wt 167.2 lb

## 2024-01-27 DIAGNOSIS — Z23 Encounter for immunization: Secondary | ICD-10-CM

## 2024-01-27 DIAGNOSIS — R109 Unspecified abdominal pain: Secondary | ICD-10-CM

## 2024-01-27 NOTE — Progress Notes (Signed)
 Musculoskeletal Exam  Patient: Alexandria Bradley DOB: January 10, 1961  DOS: 01/27/2024  SUBJECTIVE:  Chief Complaint:   Chief Complaint  Patient presents with   Hip Pain    Hip Pain    Alexandria Bradley is a 63 y.o.  female for evaluation and treatment of R hip pain.   Onset:  2 months ago. No inj or change in activity.  Location: Upper outer R hip Character:   stiffness/achy   Progression of issue:  waxes and wanes Associated symptoms: limping, slight swelling No bruising, redness, catching/locking of hip, bowel changes, fevers, rashes, urinary complaints.  Treatment: to date has been OTC NSAIDS and acetaminophen .   Neurovascular symptoms: no  Past Medical History:  Diagnosis Date   Allergy    Anxiety    Arthritis    Back pain    Breast cancer (HCC)    left   Fatty liver    GERD (gastroesophageal reflux disease)    Gout    Headache(784.0)    PMH : migraines   History of kidney stones    Hypercholesterolemia    Hypertension    Hypothyroidism    Osteoporosis    Personal history of chemotherapy    Personal history of radiation therapy    PONV (postoperative nausea and vomiting)    difficult to wake up   Pre-diabetes    Psoriasis    PVC's (premature ventricular contractions)    Radiation 08/18/14-10/07/14   Left breast   Sleep apnea    no CPAP    Objective: VITAL SIGNS: BP 128/74 (BP Location: Left Arm, Patient Position: Sitting)   Pulse 72   Temp 98 F (36.7 C) (Oral)   Resp 16   Ht 5\' 5"  (1.651 m)   Wt 167 lb 3.2 oz (75.8 kg)   SpO2 96%   BMI 27.82 kg/m  Constitutional: Well formed, well developed. No acute distress. Thorax & Lungs: No accessory muscle use Abd: BS+, S, ND, TTP over LLQ, +Carnett's Musculoskeletal: R hip.   Tenderness to palpation: not over hip Deformity: no Ecchymosis: no Tests positive: Carnett's Tests negative: Stinchfield, logroll Neurologic: Normal sensory function. No focal deficits noted. Gait antalgic Psychiatric: Normal  mood. Age appropriate judgment and insight. Alert & oriented x 3.    Assessment:  Abdominal wall pain - Plan: Ambulatory referral to Physical Therapy, CANCELED: Ambulatory referral to Physical Therapy  Need for pneumococcal 20-valent conjugate vaccination - Plan: Pneumococcal conjugate vaccine 20-valent (Prevnar 20)  Plan: Refer PT through [employer], heat, ice, Tylenol .  ECV 20 today. F/u as originally scheduled. The patient voiced understanding and agreement to the plan.   Shellie Dials [place], DO 01/27/24  4:41 PM

## 2024-01-27 NOTE — Patient Instructions (Addendum)
Ice/cold pack over area for 10-15 min twice daily.  Heat (pad or rice pillow in microwave) over affected area, 10-15 minutes twice daily.   OK to take Tylenol 1000 mg (2 extra strength tabs) or 975 mg (3 regular strength tabs) every 6 hours as needed.  If you do not hear anything about your referral in the next 1-2 weeks, call our office and ask for an update.  Let us know if you need anything.

## 2024-01-28 ENCOUNTER — Encounter: Payer: Self-pay | Admitting: Family Medicine

## 2024-02-02 ENCOUNTER — Encounter: Payer: Self-pay | Admitting: Family Medicine

## 2024-02-27 ENCOUNTER — Other Ambulatory Visit: Payer: Self-pay | Admitting: Family Medicine

## 2024-03-13 ENCOUNTER — Other Ambulatory Visit: Payer: Self-pay | Admitting: Family Medicine

## 2024-04-12 ENCOUNTER — Emergency Department (HOSPITAL_BASED_OUTPATIENT_CLINIC_OR_DEPARTMENT_OTHER)

## 2024-04-12 ENCOUNTER — Emergency Department (HOSPITAL_BASED_OUTPATIENT_CLINIC_OR_DEPARTMENT_OTHER)
Admission: EM | Admit: 2024-04-12 | Discharge: 2024-04-12 | Disposition: A | Discharge status: Home / Self Care | Attending: Emergency Medicine | Admitting: Emergency Medicine

## 2024-04-12 ENCOUNTER — Other Ambulatory Visit: Payer: Self-pay

## 2024-04-12 ENCOUNTER — Encounter (HOSPITAL_BASED_OUTPATIENT_CLINIC_OR_DEPARTMENT_OTHER): Payer: Self-pay | Admitting: Emergency Medicine

## 2024-04-12 DIAGNOSIS — R1032 Left lower quadrant pain: Secondary | ICD-10-CM | POA: Diagnosis present

## 2024-04-12 DIAGNOSIS — E039 Hypothyroidism, unspecified: Secondary | ICD-10-CM | POA: Diagnosis not present

## 2024-04-12 DIAGNOSIS — Z853 Personal history of malignant neoplasm of breast: Secondary | ICD-10-CM | POA: Diagnosis not present

## 2024-04-12 DIAGNOSIS — K5792 Diverticulitis of intestine, part unspecified, without perforation or abscess without bleeding: Secondary | ICD-10-CM | POA: Insufficient documentation

## 2024-04-12 DIAGNOSIS — E876 Hypokalemia: Secondary | ICD-10-CM | POA: Diagnosis not present

## 2024-04-12 LAB — CBC
HCT: 38.8 % (ref 36.0–46.0)
Hemoglobin: 13.3 g/dL (ref 12.0–15.0)
MCH: 30.6 pg (ref 26.0–34.0)
MCHC: 34.3 g/dL (ref 30.0–36.0)
MCV: 89.4 fL (ref 80.0–100.0)
Platelets: 287 K/uL (ref 150–400)
RBC: 4.34 MIL/uL (ref 3.87–5.11)
RDW: 12.8 % (ref 11.5–15.5)
WBC: 9 K/uL (ref 4.0–10.5)
nRBC: 0 % (ref 0.0–0.2)

## 2024-04-12 LAB — COMPREHENSIVE METABOLIC PANEL WITH GFR
ALT: 20 U/L (ref 0–44)
AST: 20 U/L (ref 15–41)
Albumin: 4.7 g/dL (ref 3.5–5.0)
Alkaline Phosphatase: 91 U/L (ref 38–126)
Anion gap: 14 (ref 5–15)
BUN: 13 mg/dL (ref 8–23)
CO2: 24 mmol/L (ref 22–32)
Calcium: 10.3 mg/dL (ref 8.9–10.3)
Chloride: 100 mmol/L (ref 98–111)
Creatinine, Ser: 0.69 mg/dL (ref 0.44–1.00)
GFR, Estimated: >60 mL/min (ref >60)
Glucose, Bld: 105 mg/dL — ABNORMAL HIGH (ref 70–99)
Potassium: 3.3 mmol/L — ABNORMAL LOW (ref 3.5–5.1)
Sodium: 138 mmol/L (ref 135–145)
Total Bilirubin: 0.5 mg/dL (ref 0.0–1.2)
Total Protein: 7.9 g/dL (ref 6.5–8.1)

## 2024-04-12 LAB — URINALYSIS, ROUTINE W REFLEX MICROSCOPIC
Bilirubin Urine: NEGATIVE
Glucose, UA: NEGATIVE mg/dL
Ketones, ur: NEGATIVE mg/dL
Leukocytes,Ua: NEGATIVE
Nitrite: NEGATIVE
Protein, ur: NEGATIVE mg/dL
Specific Gravity, Urine: 1.01 (ref 1.005–1.030)
pH: 5.5 (ref 5.0–8.0)

## 2024-04-12 LAB — LIPASE, BLOOD: Lipase: 33 U/L (ref 11–51)

## 2024-04-12 LAB — MAGNESIUM: Magnesium: 2.2 mg/dL (ref 1.7–2.4)

## 2024-04-12 LAB — URINALYSIS, MICROSCOPIC (REFLEX)

## 2024-04-12 MED ORDER — ONDANSETRON HCL 4 MG/2ML IJ SOLN
4.0000 mg | Freq: Once | INTRAMUSCULAR | Status: AC
  Administered 2024-04-12: 4 mg via INTRAVENOUS
  Filled 2024-04-12: qty 2

## 2024-04-12 MED ORDER — AMOXICILLIN-POT CLAVULANATE 875-125 MG PO TABS
1.0000 | ORAL_TABLET | Freq: Two times a day (BID) | ORAL | 0 refills | Status: DC
Start: 2024-04-12 — End: 2024-04-19

## 2024-04-12 MED ORDER — IOHEXOL 300 MG/ML  SOLN
100.0000 mL | Freq: Once | INTRAMUSCULAR | Status: AC | PRN
  Administered 2024-04-12: 100 mL via INTRAVENOUS

## 2024-04-12 MED ORDER — ONDANSETRON 4 MG PO TBDP: 4.0000 mg | ORAL_TABLET | Freq: Three times a day (TID) | ORAL | 0 refills | Status: DC | PRN

## 2024-04-12 MED ORDER — AMOXICILLIN-POT CLAVULANATE 875-125 MG PO TABS
1.0000 | ORAL_TABLET | Freq: Once | ORAL | Status: AC
  Administered 2024-04-12: 1 via ORAL
  Filled 2024-04-12: qty 1

## 2024-04-12 MED ORDER — SODIUM CHLORIDE 0.9 % IV BOLUS
1000.0000 mL | Freq: Once | INTRAVENOUS | Status: AC
  Administered 2024-04-12: 1000 mL via INTRAVENOUS

## 2024-04-12 NOTE — ED Notes (Signed)
 Pt has has water and crackers, tolerated well, no n/v reported

## 2024-04-12 NOTE — ED Provider Notes (Addendum)
 [place] [facility] Provider Note   CSN: [id number] Arrival date & time: 04/12/24  1241     Patient presents with: Abdominal Pain   Alexandria Bradley is a 63 y.o. female.   Pt is a 63 yo female with pmhx significant for osteoporosis, arthritis, hypothyroidism, left breast cancer, hld, gerd, and sleep apnea.  Pt has been out of town this weekend and developed LLQ abd pain.  She had some nausea last night.  She did take tylenol  last night with some relief.  No urinary sx.  She has had diverticulitis in the past and feels like this is similar.       Prior to Admission medications   Medication Sig Start Date End Date Taking? Authorizing Provider  amoxicillin -clavulanate (AUGMENTIN ) 875-125 MG tablet Take 1 tablet by mouth every 12 (twelve) hours. 04/12/24  Yes Dean Clarity, MD  ondansetron  (ZOFRAN -ODT) 4 MG disintegrating tablet Take 1 tablet (4 mg total) by mouth every 8 (eight) hours as needed. 04/12/24  Yes Dean Clarity, MD  albuterol  (VENTOLIN  HFA) 108 (90 Base) MCG/ACT inhaler Inhale 2 puffs into the lungs every 6 (six) hours as needed for wheezing or shortness of breath (Cough). 09/24/22   Joesph Shaver Scales, PA-C  allopurinol  (ZYLOPRIM ) 100 MG tablet TAKE 1 TABLET BY MOUTH EVERY DAY 03/15/24   [name], DO  azelastine  (ASTELIN ) 0.1 % nasal spray Place 2 sprays into both nostrils 2 (two) times daily. Use in each nostril as directed 08/07/23   Patel, Kunjan B, MD  betamethasone dipropionate 0.05 % cream Apply 1 Application topically daily. 04/07/23   [provider]  diltiazem  (CARDIZEM  CD) 240 MG 24 hr capsule TAKE 1 CAPSULE BY MOUTH EVERY DAY 03/15/24   Wendling, Mabel Mt, DO  fexofenadine (ALLEGRA) 180 MG tablet Take 180 mg by mouth daily.    [provider]  Levothyroxine  Sodium (TIROSINT ) 150 MCG CAPS Take 150 mcg by mouth daily before breakfast.    [provider]  liothyronine  (CYTOMEL ) 5  MCG tablet Take 5 mcg by mouth daily before breakfast.  11/13/13   [provider]  mometasone  (NASONEX ) 50 MCG/ACT nasal spray PLACE 2 SPRAYS INTO THE NOSE DAILY. 02/27/24   [name], DO  potassium chloride  (KLOR-CON ) 10 MEQ tablet TAKE 1 TABLET BY MOUTH EVERY DAY 03/15/24   Wendling, Mabel Mt, DO  triamterene -hydrochlorothiazide  (MAXZIDE -25) 37.5-25 MG tablet Take 1 tablet by mouth daily. 06/17/23   [name], DO  prochlorperazine  (COMPAZINE ) 10 MG tablet Take 1 tablet (10 mg total) by mouth every 6 (six) hours as needed (Nausea or vomiting). 11/13/18 05/11/19  Magrinat, Sandria BROCKS, MD    Allergies: Chlorhexidine , Ciprofloxacin , Prednisone , Codeine, Cortisone, and Colchicine     Review of Systems  Gastrointestinal:  Positive for abdominal pain.  All other systems reviewed and are negative.   Updated Vital Signs BP (!) 151/78 (BP Location: Right Arm)   Pulse 62   Temp 98.1 F (36.7 C)   Resp 18   Ht 5' 5 (1.651 m)   Wt 73 kg   SpO2 100%   BMI 26.79 kg/m   Physical Exam Vitals and nursing note reviewed.  Constitutional:      Appearance: She is well-developed.  HENT:     Head: Normocephalic and atraumatic.     Mouth/Throat:     Mouth: Mucous membranes are moist.     Pharynx: Oropharynx is clear.  Eyes:     Extraocular Movements: Extraocular movements  intact.     Pupils: Pupils are equal, round, and reactive to light.  Cardiovascular:     Rate and Rhythm: Normal rate and regular rhythm.  Pulmonary:     Effort: Pulmonary effort is normal.     Breath sounds: Normal breath sounds.  Abdominal:     General: Abdomen is flat. Bowel sounds are normal.     Palpations: Abdomen is soft.     Tenderness: There is abdominal tenderness in the left lower quadrant.  Skin:    General: Skin is warm.     Capillary Refill: Capillary refill takes less than 2 seconds.     Findings: No rash.  Neurological:     General: No focal deficit present.     Mental  Status: She is alert and oriented to person, place, and time.  Psychiatric:        Mood and Affect: Mood normal.        Behavior: Behavior normal.     (all labs ordered are listed, but only abnormal results are displayed) Labs Reviewed  COMPREHENSIVE METABOLIC PANEL WITH GFR - Abnormal; Notable for the following components:      Result Value   Potassium 3.3 (*)    Glucose, Bld 105 (*)    All other components within normal limits  URINALYSIS, ROUTINE W REFLEX MICROSCOPIC - Abnormal; Notable for the following components:   Hgb urine dipstick TRACE (*)    All other components within normal limits  URINALYSIS, MICROSCOPIC (REFLEX) - Abnormal; Notable for the following components:   Bacteria, UA RARE (*)    All other components within normal limits  LIPASE, BLOOD  CBC  MAGNESIUM     EKG: None  Radiology: CT ABDOMEN PELVIS W CONTRAST Result Date: 04/12/2024 CLINICAL DATA:  LLQ abd pain radiating to back x3 days. C/o nausea last night. EXAM: CT ABDOMEN AND PELVIS WITH CONTRAST TECHNIQUE: Multidetector CT imaging of the abdomen and pelvis was performed using the standard protocol following bolus administration of intravenous contrast. RADIATION DOSE REDUCTION: This exam was performed according to the departmental dose-optimization program which includes automated exposure control, adjustment of the mA and/or kV according to patient size and/or use of iterative reconstruction technique. CONTRAST:  OMNIPAQUE  IOHEXOL  300 MG/ML  SOLN COMPARISON:  CT abdomen pelvis 03/12/2020, CT abdomen pelvis 04/22/2023 FINDINGS: Lower chest: Bilateral breast implants.  No acute abnormality. Hepatobiliary: No focal liver abnormality. Status post cholecystectomy. No biliary dilatation. Pancreas: No focal lesion. Normal pancreatic contour. No surrounding inflammatory changes. No main pancreatic ductal dilatation. Spleen: Normal in size without focal abnormality.  Splenules noted. Adrenals/Urinary Tract: No  adrenal nodule bilaterally. Bilateral kidneys enhance symmetrically. No hydronephrosis. No hydroureter. No nephroureterolithiasis bilaterally. Fluid density lesion within the right kidney likely represents a simple renal cyst. Simple renal cysts, in the absence of clinically indicated signs/symptoms, require no independent follow-up. The urinary bladder is unremarkable. Stomach/Bowel: Stomach is within normal limits. No evidence of small bowel wall thickening or dilatation. Mild bowel wall thickening along a focal distal descending/ proximal sigmoid colon diverticula with trace pericolonic fat stranding. Colonic diverticulosis. The appendix is not definitely identified with no inflammatory changes in the right lower quadrant to suggest acute appendicitis. Vascular/Lymphatic: No abdominal aorta or iliac aneurysm. Mild to moderate atherosclerotic plaque of the aorta and its branches. No abdominal, pelvic, or inguinal lymphadenopathy. Reproductive: Uterus and bilateral adnexa are unremarkable. Other: No intraperitoneal free fluid. No intraperitoneal free gas. No organized fluid collection. Musculoskeletal: No abdominal wall hernia or abnormality. No suspicious  lytic or blastic osseous lesions. No acute displaced fracture. IMPRESSION: 1. Colonic diverticulosis with uncomplicated acute diverticulitis of the distal descending/proximal sigmoid colon. Recommend colonoscopy status post treatment and status post complete resolution of inflammatory changes to exclude an underlying lesion. 2.  Aortic Atherosclerosis (ICD10-I70.0). Electronically Signed   By: Morgane  Naveau M.D.   On: 04/12/2024 14:42     Procedures   Medications Ordered in the ED  iohexol  (OMNIPAQUE ) 300 MG/ML solution 100 mL (100 mLs Intravenous Contrast Given 04/12/24 1409)  sodium chloride  0.9 % bolus 1,000 mL (1,000 mLs Intravenous New Bag/Given 04/12/24 1513)  ondansetron  (ZOFRAN ) injection 4 mg (4 mg Intravenous Given 04/12/24 1514)   amoxicillin -clavulanate (AUGMENTIN ) 875-125 MG per tablet 1 tablet (1 tablet Oral Given 04/12/24 1509)                                    Medical Decision Making Amount and/or Complexity of Data Reviewed Labs: ordered. Radiology: ordered.  Risk Prescription drug management.   This patient presents to the ED for concern of llq abd pain, this involves an extensive number of treatment options, and is a complaint that carries with it a high risk of complications and morbidity.  The differential diagnosis includes diverticulitis, uti, kidney stone, shingles   Co morbidities that complicate the patient evaluation  osteoporosis, arthritis, hypothyroidism, left breast cancer, hld, gerd, and sleep apnea   Additional history obtained:  Additional history obtained from epic chart review External records from outside source obtained and reviewed including husband   Lab Tests:  I Ordered, and personally interpreted labs.  The pertinent results include:  cbc nl, cmp nl, lipase, ua nl   Imaging Studies ordered:  I ordered imaging studies including ct abd/pelvis  I independently visualized and interpreted imaging which showed   Colonic diverticulosis with uncomplicated acute diverticulitis of  the distal descending/proximal sigmoid colon. Recommend colonoscopy  status post treatment and status post complete resolution of  inflammatory changes to exclude an underlying lesion.  2.  Aortic Atherosclerosis (ICD10-I70.0).   I agree with the radiologist interpretation   Medicines ordered and prescription drug management:  I ordered medication including ivfs/zofran   for sx  Reevaluation of the patient after these medicines showed that the patient improved I have reviewed the patients home medicines and have made adjustments as needed   Test Considered:  ct   Critical Interventions:  abx  Problem List / ED Course:  Acute diverticulitis:  pain is under control.  Pt has no  findings of sepsis.  She's had diverticulitis in the past and was referred to Dr. Teresa for surgical resection.  She does not want to do surgery unless she absolutely has to.  She does see [facility], but Dr. Aneita has retired.  She has not seen a new provider at [facility].  She is started on Augmentin .  She is to f/u with pcp and with GI.  Return if worse.    Reevaluation:  After the interventions noted above, I reevaluated the patient and found that they have :improved   Social Determinants of Health:  Lives at home   Dispostion:  After consideration of the diagnostic results and the patients response to treatment, I feel that the patent would benefit from discharge with outpatient f/u.       Final diagnoses:  Diverticulitis    ED Discharge Orders          Ordered  amoxicillin -clavulanate (AUGMENTIN ) 875-125 MG tablet  Every 12 hours        04/12/24 1454    ondansetron  (ZOFRAN -ODT) 4 MG disintegrating tablet  Every 8 hours PRN        04/12/24 1454               Iantha Titsworth, MD 04/12/24 1541    Dean Clarity, MD 04/12/24 [phone]

## 2024-04-12 NOTE — ED Triage Notes (Signed)
 Pt POV- c/o LLQ abd pain radiating to back x3 days. C/o nausea last night, denies emesis.    Took tylenol  last night, some relief. LBM Saturday.   Hx of diverticulitis. Denies known fever.

## 2024-04-12 NOTE — ED Notes (Addendum)
 Called lab, will add Mag to existing tubes

## 2024-04-15 ENCOUNTER — Telehealth: Payer: Self-pay | Admitting: Family Medicine

## 2024-04-15 NOTE — Telephone Encounter (Signed)
Spoke w/ Pt- appt scheduled.  

## 2024-04-15 NOTE — Telephone Encounter (Signed)
 Copied from CRM [id number]. Topic: Appointments - Appointment Scheduling >> Apr 15, 2024 11:34 AM Jasmin G wrote: Patient/patient representative is calling to schedule an appointment. Refer to attachments for appointment information. Pt. Is trying to schedule a hospital follow up appt., I only got dates for a week from now but pt. Said she will be out on vacation by then, pt. Wants to see if there's any earlier appts.

## 2024-04-19 ENCOUNTER — Ambulatory Visit (INDEPENDENT_AMBULATORY_CARE_PROVIDER_SITE_OTHER): Admitting: Gastroenterology

## 2024-04-19 ENCOUNTER — Encounter: Payer: Self-pay | Admitting: Gastroenterology

## 2024-04-19 VITALS — BP 136/80 | HR 65 | Ht 65.0 in | Wt 163.0 lb

## 2024-04-19 DIAGNOSIS — K5732 Diverticulitis of large intestine without perforation or abscess without bleeding: Secondary | ICD-10-CM

## 2024-04-19 DIAGNOSIS — R197 Diarrhea, unspecified: Secondary | ICD-10-CM

## 2024-04-19 DIAGNOSIS — K5792 Diverticulitis of intestine, part unspecified, without perforation or abscess without bleeding: Secondary | ICD-10-CM

## 2024-04-19 MED ORDER — DICYCLOMINE HCL 10 MG PO CAPS: 10.0000 mg | ORAL_CAPSULE | Freq: Three times a day (TID) | ORAL | Status: DC | PRN

## 2024-04-19 MED ORDER — AMOXICILLIN-POT CLAVULANATE 875-125 MG PO TABS: 1.0000 | ORAL_TABLET | Freq: Two times a day (BID) | ORAL | 0 refills | Status: DC

## 2024-04-19 NOTE — Progress Notes (Signed)
 HPI :  63 year old female with a history of diverticulitis, here for a follow-up visit.  She was previously seen by Dr. Aneita who has since retired.  Recall she has had a few episodes of diverticulitis last year.  She was admitted both times, once with concern for sepsis and other for concern for microperforation.  Episodes localized to the sigmoid colon.  She eventually resolved with treatment with antibiotics.  She was seen by Dr. Teresa of colorectal surgery to discuss colonic resection after her hospitalization.  She has really wanted to avoid surgery if at all possible and declined after the visit.  Her last episode which involved microperforation was in July 2024.  She states she was at baseline and doing okay until she was on vacation this past week and woke up with left lower quadrant pain that woke her out of the sleep.  She felt nauseated with this.  These are her typical symptoms of diverticulitis.  She came home and went to the ER where she had a CT scan showing left-sided diverticulitis without complications.  Her labs looked okay.  She has an allergy to Cipro , she was treated with Augmentin  for 1 week course which she just finished this morning.  She states this has made a big difference and she is feeling better.  She has some mild discomfort in her left lower quadrant but continues to improve and she feels she has resolving this.  She has been on a bland diet over the past week.  Her bowel function had been at baseline and pretty normal leading up to this episode.  On the antibiotic she had some diarrhea.  She is scheduled to go to the beach again next week and a bit anxious about what to do with any recurrent symptoms.    Prior evaluation: CT AP 01/08/2023 1. Evidence of mild acute diverticulitis of the sigmoid colon. No evidence of perforation or diverticular abscess. 2. Few small bilateral subcentimeter renal cortical hypodensities too small to characterize, but likely cysts.  These are unchanged as no further imaging follow-up is recommended. 3. Aortic atherosclerosis. 4. 3 mm nodule over the right middle lobe unchanged from 2022. No further follow-up recommended. 5. Aortic Atherosclerosis      Colonoscopy June 2021 - The examined portion of the ileum was normal.  - A single non-bleeding colonic angiodysplastic lesion.  - Mild diverticulosis in the left colon.  - Patchy mild erythema was seen in sigmoid colon and rectum. Biopsied.  - The examination was otherwise normal on direct and retroflexion views. Random biopsies obtained.  Path: lymphocytic colitis    CT abdomen pelvis 04/22/23: IMPRESSION: 1. Acute sigmoid colon diverticulitis with micro perforation. No abscess.     Colonoscopy 06/10/2023: - The perianal and digital rectal examinations were normal. - The terminal ileum appeared normal. - A 7 mm polyp was found in the sigmoid colon. The polyp was sessile. The polyp was removed with a cold snare. Resection and retrieval were complete. - Multiple medium-mouthed and small-mouthed diverticula were found in the left colon. There was evidence of diverticular spasm. There was evidence of an impacted diverticulum. There was no evidence of diverticular bleeding. - The exam was otherwise without abnormality on direct and retroflexion views. Random biopsies obtained throughout.  FINAL DIAGNOSIS        1. Surgical [P], colon nos, random sites :       - UNREMARKABLE COLONIC MUCOSA.       - NEGATIVE FOR MICROSCOPIC COLITIS, DYSPLASIA,  AND MALIGNANCY.        2. Surgical [P], colon, sigmoid, polyp (1) :       - POLYPOID FRAGMENT OF INFLAMED GRANULATION TISSUE/INFLAMMATORY POLYP.       - NEGATIVE FOR DYSPLASIA AND MALIGNANCY.    CT abdomen / pelvis 04/12/24: IMPRESSION: 1. Colonic diverticulosis with uncomplicated acute diverticulitis of the distal descending/proximal sigmoid colon. Recommend colonoscopy status post treatment and status post complete  resolution of inflammatory changes to exclude an underlying lesion. 2.  Aortic Atherosclerosis (ICD10-I70.0).      Past Medical History:  Diagnosis Date   Allergy    Anxiety    Arthritis    Back pain    Breast cancer (HCC)    left   Fatty liver    GERD (gastroesophageal reflux disease)    Gout    Headache(784.0)    PMH : migraines   History of kidney stones    Hypercholesterolemia    Hypertension    Hypothyroidism    Osteoporosis    Personal history of chemotherapy    Personal history of radiation therapy    PONV (postoperative nausea and vomiting)    difficult to wake up   Pre-diabetes    Psoriasis    PVC's (premature ventricular contractions)    Radiation 08/18/14-10/07/14   Left breast   Sleep apnea    no CPAP     Past Surgical History:  Procedure Laterality Date   BREAST BIOPSY Left 11/10/2018   BREAST LUMPECTOMY Left 2015   BREAST LUMPECTOMY WITH AXILLARY LYMPH NODE BIOPSY  02/2014   left   BREAST RECONSTRUCTION WITH PLACEMENT OF TISSUE EXPANDER AND ALLODERM Bilateral 04/12/2019   Procedure: BILATERAL BREAST RECONSTRUCTION WITH PLACEMENT OF TISSUE EXPANDERS; ALLODERM TO RIGHT CHEST;  Surgeon: Arelia Filippo, MD;  Location: [facility];  Service: Plastics;  Laterality: Bilateral;   BREAST RECONSTRUCTION WITH PLACEMENT OF TISSUE EXPANDER AND ALLODERM Right 01/24/2020   Procedure: BREAST RECONSTRUCTION WITH PLACEMENT OF TISSUE EXPANDER AND ALLODERM;  Surgeon: Arelia Filippo, MD;  Location: [facility];  Service: Plastics;  Laterality: Right;   CARDIAC CATHETERIZATION     CHOLECYSTECTOMY     colon polyps     COLONOSCOPY N/A 06/27/2014   Procedure: COLONOSCOPY;  Surgeon: Jerrell KYM Sol, MD;  Location: [facility];  Service: Endoscopy;  Laterality: N/A;   COLONOSCOPY  02/2020   LATISSIMUS FLAP TO BREAST Left 04/12/2019   Procedure: LEFT LATISSIMUS DORSI FLAP TO LEFT CHEST;  Surgeon: Arelia Filippo, MD;  Location: [facility];  Service: Plastics;   Laterality: Left;   LEFT HEART CATH AND CORONARY ANGIOGRAPHY N/A 07/24/2018   Procedure: LEFT HEART CATH AND CORONARY ANGIOGRAPHY;  Surgeon: Burnard Debby LABOR, MD;  Location: [facility];  Service: Cardiovascular;  Laterality: N/A;   LIPOMA EXCISION     LIPOSUCTION WITH LIPOFILLING Bilateral 06/12/2020   Procedure: LIPOFILLING TO BILATERAL CHEST;  Surgeon: Arelia Filippo, MD;  Location: [facility];  Service: Plastics;  Laterality: Bilateral;   MASTECTOMY W/ SENTINEL NODE BIOPSY Bilateral 04/12/2019   Procedure: RIGHT BREAST RISK REDUCING MASTECTOMY; LEFT MASTECTOMY WITH LEFT AXILLARY SENTINEL LYMPH NODE BIOPSY WITH BLUE DYE INJECTION;  Surgeon: Ebbie Cough, MD;  Location: [facility];  Service: General;  Laterality: Bilateral;   MYOMECTOMY     PORT-A-CATH REMOVAL N/A 02/03/2015   Procedure: REMOVAL PORT-A-CATH;  Surgeon: Cough Ebbie, MD;  Location: [facility];  Service: General;  Laterality: N/A;   PORT-A-CATH REMOVAL Right 01/24/2020   Procedure: REMOVAL  PORT-A-CATH;  Surgeon: Arelia Filippo, MD;  Location: [facility];  Service: Plastics;  Laterality: Right;   PORTACATH PLACEMENT N/A 03/01/2014   Procedure: INSERTION PORT-A-CATH;  Surgeon: Donnice Bury, MD;  Location: [facility];  Service: General;  Laterality: N/A;   PORTACATH PLACEMENT N/A 11/18/2018   Procedure: INSERTION PORT-A-CATH WITH ULTRASOUND;  Surgeon: Bury Donnice, MD;  Location: [facility];  Service: General;  Laterality: N/A;   REMOVAL OF BILATERAL TISSUE EXPANDERS WITH PLACEMENT OF BILATERAL BREAST IMPLANTS Bilateral 06/12/2020   Procedure: REMOVAL OF BILATERAL TISSUE EXPANDERS WITH PLACEMENT OF BILATERAL BREAST IMPLANTS;  Surgeon: Arelia Filippo, MD;  Location: [facility];  Service: Plastics;  Laterality: Bilateral;   REMOVAL OF TISSUE EXPANDER AND PLACEMENT OF IMPLANT Right 06/02/2019   Procedure: REMOVAL OF TISSUE EXPANDER  RIGHT CHEST;  Surgeon: Arelia Filippo, MD;  Location: [facility];  Service: Plastics;  Laterality: Right;   TONSILLECTOMY     TUBAL LIGATION     WOUND DEBRIDEMENT Left 06/02/2019   Procedure: LEFT CHEST DEBRIDEMENT;  Surgeon: Arelia Filippo, MD;  Location:  SURGERY CENTER;  Service: Plastics;  Laterality: Left;   Family History  Problem Relation Age of Onset   Endometrial cancer Mother 51   Hearing loss Mother    Atrial fibrillation Mother    Lung cancer Father    Brain cancer Father    Barrett's esophagus Sister    Heart disease Maternal Aunt    Atrial fibrillation Maternal Aunt    Lung cancer Maternal Uncle    Atrial fibrillation Maternal Uncle    Bladder Cancer Maternal Uncle    Multiple myeloma Maternal Uncle    Stroke Maternal Grandmother    Stroke Maternal Grandfather    Other Son        trisomy 13   Colon polyps Cousin    Colon cancer Neg Hx    Esophageal cancer Neg Hx    Rectal cancer Neg Hx    Stomach cancer Neg Hx    Social History   Tobacco Use   Smoking status: Former    Current packs/day: 0.50    Average packs/day: 0.5 packs/day for 15.0 years (7.5 ttl pk-yrs)    Types: Cigarettes   Smokeless tobacco: Never   Tobacco comments:    Quit smoking cigarettes in 2002  Vaping Use   Vaping status: Never Used  Substance Use Topics   Alcohol use: Not Currently   Drug use: No   Current Outpatient Medications  Medication Sig Dispense Refill   albuterol  (VENTOLIN  HFA) 108 (90 Base) MCG/ACT inhaler Inhale 2 puffs into the lungs every 6 (six) hours as needed for wheezing or shortness of breath (Cough). 18 g 0   allopurinol  (ZYLOPRIM ) 100 MG tablet TAKE 1 TABLET BY MOUTH EVERY DAY 90 tablet 2   amoxicillin -clavulanate (AUGMENTIN ) 875-125 MG tablet Take 1 tablet by mouth every 12 (twelve) hours. 14 tablet 0   azelastine  (ASTELIN ) 0.1 % nasal spray Place 2 sprays into both nostrils 2 (two) times daily. Use in each nostril as directed 30 mL  12   betamethasone dipropionate 0.05 % cream Apply 1 Application topically daily.     diltiazem  (CARDIZEM  CD) 240 MG 24 hr capsule TAKE 1 CAPSULE BY MOUTH EVERY DAY 90 capsule 3   fexofenadine (ALLEGRA) 180 MG tablet Take 180 mg by mouth daily.     Levothyroxine  Sodium (TIROSINT ) 150 MCG CAPS Take 150 mcg by mouth daily before breakfast.     liothyronine  (  CYTOMEL ) 5 MCG tablet Take 5 mcg by mouth daily before breakfast.      mometasone  (NASONEX ) 50 MCG/ACT nasal spray PLACE 2 SPRAYS INTO THE NOSE DAILY. 17 each 12   ondansetron  (ZOFRAN -ODT) 4 MG disintegrating tablet Take 1 tablet (4 mg total) by mouth every 8 (eight) hours as needed. 20 tablet 0   potassium chloride  (KLOR-CON ) 10 MEQ tablet TAKE 1 TABLET BY MOUTH EVERY DAY 90 tablet 2   triamterene -hydrochlorothiazide  (MAXZIDE -25) 37.5-25 MG tablet Take 1 tablet by mouth daily. 90 tablet 3   No current facility-administered medications for this visit.   Allergies  Allergen Reactions   Chlorhexidine  Itching   Ciprofloxacin  Anxiety, Other (See Comments), Rash and Swelling    GI upset   Prednisone  Shortness Of Breath and Other (See Comments)    Jittery, jacks me up     Codeine Nausea Only   Cortisone Swelling and Other (See Comments)    hyper sensitive, jacks me up    Colchicine  Nausea Only     Review of Systems: All systems reviewed and negative except where noted in HPI.    CT ABDOMEN PELVIS W CONTRAST Result Date: 04/12/2024 CLINICAL DATA:  LLQ abd pain radiating to back x3 days. C/o nausea last night. EXAM: CT ABDOMEN AND PELVIS WITH CONTRAST TECHNIQUE: Multidetector CT imaging of the abdomen and pelvis was performed using the standard protocol following bolus administration of intravenous contrast. RADIATION DOSE REDUCTION: This exam was performed according to the departmental dose-optimization program which includes automated exposure control, adjustment of the mA and/or kV according to patient size and/or use of iterative  reconstruction technique. CONTRAST:  OMNIPAQUE  IOHEXOL  300 MG/ML  SOLN COMPARISON:  CT abdomen pelvis 03/12/2020, CT abdomen pelvis 04/22/2023 FINDINGS: Lower chest: Bilateral breast implants.  No acute abnormality. Hepatobiliary: No focal liver abnormality. Status post cholecystectomy. No biliary dilatation. Pancreas: No focal lesion. Normal pancreatic contour. No surrounding inflammatory changes. No main pancreatic ductal dilatation. Spleen: Normal in size without focal abnormality.  Splenules noted. Adrenals/Urinary Tract: No adrenal nodule bilaterally. Bilateral kidneys enhance symmetrically. No hydronephrosis. No hydroureter. No nephroureterolithiasis bilaterally. Fluid density lesion within the right kidney likely represents a simple renal cyst. Simple renal cysts, in the absence of clinically indicated signs/symptoms, require no independent follow-up. The urinary bladder is unremarkable. Stomach/Bowel: Stomach is within normal limits. No evidence of small bowel wall thickening or dilatation. Mild bowel wall thickening along a focal distal descending/ proximal sigmoid colon diverticula with trace pericolonic fat stranding. Colonic diverticulosis. The appendix is not definitely identified with no inflammatory changes in the right lower quadrant to suggest acute appendicitis. Vascular/Lymphatic: No abdominal aorta or iliac aneurysm. Mild to moderate atherosclerotic plaque of the aorta and its branches. No abdominal, pelvic, or inguinal lymphadenopathy. Reproductive: Uterus and bilateral adnexa are unremarkable. Other: No intraperitoneal free fluid. No intraperitoneal free gas. No organized fluid collection. Musculoskeletal: No abdominal wall hernia or abnormality. No suspicious lytic or blastic osseous lesions. No acute displaced fracture. IMPRESSION: 1. Colonic diverticulosis with uncomplicated acute diverticulitis of the distal descending/proximal sigmoid colon. Recommend colonoscopy status post  treatment and status post complete resolution of inflammatory changes to exclude an underlying lesion. 2.  Aortic Atherosclerosis (ICD10-I70.0). Electronically Signed   By: Morgane  Naveau M.D.   On: 04/12/2024 14:42    Lab Results  Component Value Date   WBC 9.0 04/12/2024   HGB 13.3 04/12/2024   HCT 38.8 04/12/2024   MCV 89.4 04/12/2024   PLT 287 04/12/2024    Lab Results  Component Value Date   NA 138 04/12/2024   CL 100 04/12/2024   K 3.3 (L) 04/12/2024   CO2 24 04/12/2024   BUN 13 04/12/2024   CREATININE 0.69 04/12/2024   GFRNONAA >60 04/12/2024   CALCIUM  10.3 04/12/2024   PHOS 2.7 04/23/2023   ALBUMIN 4.7 04/12/2024   GLUCOSE 105 (H) 04/12/2024    Lab Results  Component Value Date   ALT 20 04/12/2024   AST 20 04/12/2024   ALKPHOS 91 04/12/2024   BILITOT 0.5 04/12/2024     Physical Exam: BP 136/80   Pulse 65   Ht 5' 5 (1.651 m)   Wt 163 lb (73.9 kg)   BMI 27.12 kg/m  Constitutional: Pleasant,well-developed, female in no acute distress. Neurological: Alert and oriented to person place and time. Skin: Skin is warm and dry. No rashes noted. Psychiatric: Normal mood and affect. Behavior is normal.   ASSESSMENT: 63 y.o. female here for assessment of the following  1. Diverticulitis    Recurrent episodes of diverticulitis, now her third episode since April 2024.  2 prior episodes led to hospitalization, history of microperforation/sepsis.  Recent CT again shows left-sided diverticulitis, fortunately caught early, started on antibiotics and seems to be resolving this.  We discussed diverticulitis, risks for recurrence over time.  With her now third episode in a short period of time I think her risk for recurrent diverticulitis is quite high.  We discussed surgery again to prevent future episodes.  She has thought about this more but again is quite hesitant to proceed with it and really wants to avoid surgery if at all possible.  If she had recurrent  hospitalizations, complication such as perforation,, abscess which she understands is possible, she would be more inclined to consider surgery.  Understanding if she can catch this early and treat with antibiotics to prevent complications, she would rather treat as needed and avoid surgery again if possible.  She understand risks of this should it happen again.  Moving forward, we will give her another course of Augmentin  to keep with her at home to use as needed especially while traveling.  If she has recurrent symptoms she should just take this and let me know.  Otherwise do not take any further antibiotics as long as she is improving for now.  She has Bentyl  to use as needed at home for cramping.  Colonoscopy up-to-date, do not see any role for repeating that at this time.   PLAN: - discussed options extensively as above.  She really wants to avoid surgery right now unless recurrent hospitalizations from diverticulitis or complications from recurrent diverticulitis.  She understands she is at risk for this. - will give her a course of Augmentin  for 1 week to keep at home to use as needed while traveling if recurrent symptoms.  If she needs to take that she should let me know.  Hopefully she does not require any further antibiotics in the near future - use Bentyl  as needed  Marcey Naval, MD [facility]

## 2024-04-19 NOTE — Patient Instructions (Signed)
 We have sent the following medications to your pharmacy for you to pick up at your convenience: Augmentin  - Take twice a day for 7 days as needed  Use Bentyl  as needed  Thank you for entrusting me with your care and for choosing [facility], Dr. Elspeth Naval    If your blood pressure at your visit was 140/90 or greater, please contact your primary care physician to follow up on this. ______________________________________________________  If you are age 65 or older, your body mass index should be between 23-30. Your Body mass index is 27.12 kg/m. If this is out of the aforementioned range listed, please consider follow up with your Primary Care Provider.  If you are age 22 or younger, your body mass index should be between 19-25. Your Body mass index is 27.12 kg/m. If this is out of the aformentioned range listed, please consider follow up with your Primary Care Provider.  ________________________________________________________  The [facility] providers would like to encourage you to use MYCHART to communicate with providers for non-urgent requests or questions.  Due to long hold times on the telephone, sending your provider a message by [facility] may be a faster and more efficient way to get a response.  Please allow 48 business hours for a response.  Please remember that this is for non-urgent requests.  _______________________________________________________  Due to recent changes in healthcare laws, you may see the results of your imaging and laboratory studies on MyChart before your provider has had a chance to review them.  We understand that in some cases there may be results that are confusing or concerning to you. Not all laboratory results come back in the same time frame and the provider may be waiting for multiple results in order to interpret others.  Please give us  48 hours in order for your provider to thoroughly review all the results before contacting the office for  clarification of your results.

## 2024-04-20 ENCOUNTER — Ambulatory Visit (INDEPENDENT_AMBULATORY_CARE_PROVIDER_SITE_OTHER): Admitting: Family Medicine

## 2024-04-20 ENCOUNTER — Encounter: Payer: Self-pay | Admitting: Family Medicine

## 2024-04-20 VITALS — BP 122/74 | HR 73 | Temp 98.0°F | Resp 16 | Ht 65.0 in | Wt 162.0 lb

## 2024-04-20 DIAGNOSIS — E876 Hypokalemia: Secondary | ICD-10-CM

## 2024-04-20 DIAGNOSIS — K5792 Diverticulitis of intestine, part unspecified, without perforation or abscess without bleeding: Secondary | ICD-10-CM | POA: Diagnosis not present

## 2024-04-20 NOTE — Progress Notes (Signed)
 Chief Complaint  Patient presents with   Follow-up    ER follow Up    Subjective: Patient is a 63 y.o. female here for ER f/u.  Dx'd w diverticulitis.  She is finishing a course of Augmentin  which has helped her symptoms.  She saw the GI team yesterday and was told she does not need to do colonoscopy.  Symptoms are improving.  She is having loose stool which she attributes to the Augmentin .  Otherwise she is tolerating this well and is compliant.  Found to have low K. She had forgotten to take her supplement while on a trip and is now back to taking it.   Past Medical History:  Diagnosis Date   Allergy    Anxiety    Arthritis    Back pain    Breast cancer (HCC)    left   Fatty liver    GERD (gastroesophageal reflux disease)    Gout    Headache(784.0)    PMH : migraines   History of kidney stones    Hypercholesterolemia    Hypertension    Hypothyroidism    Osteoporosis    Personal history of chemotherapy    Personal history of radiation therapy    PONV (postoperative nausea and vomiting)    difficult to wake up   Pre-diabetes    Psoriasis    PVC's (premature ventricular contractions)    Radiation 08/18/14-10/07/14   Left breast   Sleep apnea    no CPAP    Objective: BP 122/74 (BP Location: Left Arm, Patient Position: Sitting)   Pulse 73   Temp 98 F (36.7 C) (Oral)   Resp 16   Ht 5' 5 (1.651 m)   Wt 162 lb (73.5 kg)   SpO2 100%   BMI 26.96 kg/m  General: Awake, appears stated age Heart: RRR, no LE edema Lungs: CTAB, no rales, wheezes or rhonchi. No accessory muscle use Abd: BS+, S, NT, ND Mouth: MMM Psych: Age appropriate judgment and insight, normal affect and mood  Assessment and Plan: Diverticulitis  Hypokalemia  Improving. Finshing Augmentin . Try to pin down triggers a/w flares.  Cont supp. Decided to avoid going to repeat labs today.  F/u in 3 mo for med ck and will get labs at that time as well.  The patient voiced understanding and agreement  to the plan.  Mabel [place], DO 04/20/24  1:15 PM

## 2024-04-20 NOTE — Patient Instructions (Signed)
 Keep the diet clean and stay active.  Try to monitor triggers for your diverticulitis flares.   Let me know if you change your mind about starting a statin.   Let us  know if you need anything.

## 2024-04-21 ENCOUNTER — Other Ambulatory Visit: Payer: Self-pay

## 2024-04-21 ENCOUNTER — Emergency Department (HOSPITAL_BASED_OUTPATIENT_CLINIC_OR_DEPARTMENT_OTHER)

## 2024-04-21 ENCOUNTER — Observation Stay (HOSPITAL_BASED_OUTPATIENT_CLINIC_OR_DEPARTMENT_OTHER)
Admission: EM | Admit: 2024-04-21 | Discharge: 2024-04-22 | Disposition: A | Discharge status: Home / Self Care | Source: Ambulatory Visit | Attending: Internal Medicine | Admitting: Internal Medicine

## 2024-04-21 ENCOUNTER — Encounter (HOSPITAL_BASED_OUTPATIENT_CLINIC_OR_DEPARTMENT_OTHER): Payer: Self-pay

## 2024-04-21 DIAGNOSIS — K5732 Diverticulitis of large intestine without perforation or abscess without bleeding: Principal | ICD-10-CM | POA: Insufficient documentation

## 2024-04-21 DIAGNOSIS — Z8679 Personal history of other diseases of the circulatory system: Secondary | ICD-10-CM | POA: Insufficient documentation

## 2024-04-21 DIAGNOSIS — K5792 Diverticulitis of intestine, part unspecified, without perforation or abscess without bleeding: Secondary | ICD-10-CM | POA: Diagnosis not present

## 2024-04-21 DIAGNOSIS — Z6826 Body mass index (BMI) 26.0-26.9, adult: Secondary | ICD-10-CM | POA: Insufficient documentation

## 2024-04-21 DIAGNOSIS — E876 Hypokalemia: Secondary | ICD-10-CM | POA: Insufficient documentation

## 2024-04-21 DIAGNOSIS — E063 Autoimmune thyroiditis: Secondary | ICD-10-CM

## 2024-04-21 DIAGNOSIS — Z8639 Personal history of other endocrine, nutritional and metabolic disease: Secondary | ICD-10-CM | POA: Insufficient documentation

## 2024-04-21 DIAGNOSIS — M109 Gout, unspecified: Secondary | ICD-10-CM | POA: Insufficient documentation

## 2024-04-21 DIAGNOSIS — H6993 Unspecified Eustachian tube disorder, bilateral: Secondary | ICD-10-CM

## 2024-04-21 DIAGNOSIS — R1084 Generalized abdominal pain: Secondary | ICD-10-CM | POA: Diagnosis not present

## 2024-04-21 DIAGNOSIS — I10 Essential (primary) hypertension: Secondary | ICD-10-CM | POA: Diagnosis not present

## 2024-04-21 DIAGNOSIS — R109 Unspecified abdominal pain: Secondary | ICD-10-CM | POA: Diagnosis present

## 2024-04-21 DIAGNOSIS — E039 Hypothyroidism, unspecified: Secondary | ICD-10-CM | POA: Insufficient documentation

## 2024-04-21 LAB — COMPREHENSIVE METABOLIC PANEL WITH GFR
ALT: 22 U/L (ref 0–44)
AST: 22 U/L (ref 15–41)
Albumin: 4.9 g/dL (ref 3.5–5.0)
Alkaline Phosphatase: 105 U/L (ref 38–126)
Anion gap: 14 (ref 5–15)
BUN: 9 mg/dL (ref 8–23)
CO2: 23 mmol/L (ref 22–32)
Calcium: 10.3 mg/dL (ref 8.9–10.3)
Chloride: 101 mmol/L (ref 98–111)
Creatinine, Ser: 0.78 mg/dL (ref 0.44–1.00)
GFR, Estimated: >60 mL/min (ref >60)
Glucose, Bld: 98 mg/dL (ref 70–99)
Potassium: 3.5 mmol/L (ref 3.5–5.1)
Sodium: 138 mmol/L (ref 135–145)
Total Bilirubin: 0.4 mg/dL (ref 0.0–1.2)
Total Protein: 8.2 g/dL — ABNORMAL HIGH (ref 6.5–8.1)

## 2024-04-21 LAB — CBC WITH DIFFERENTIAL/PLATELET
Abs Immature Granulocytes: 0.04 K/uL (ref 0.00–0.07)
Basophils Absolute: 0.1 K/uL (ref 0.0–0.1)
Basophils Relative: 0 %
Eosinophils Absolute: 0.1 K/uL (ref 0.0–0.5)
Eosinophils Relative: 1 %
HCT: 40.5 % (ref 36.0–46.0)
Hemoglobin: 13.8 g/dL (ref 12.0–15.0)
Immature Granulocytes: 0 %
Lymphocytes Relative: 19 %
Lymphs Abs: 2.4 K/uL (ref 0.7–4.0)
MCH: 30.1 pg (ref 26.0–34.0)
MCHC: 34.1 g/dL (ref 30.0–36.0)
MCV: 88.2 fL (ref 80.0–100.0)
Monocytes Absolute: 0.8 K/uL (ref 0.1–1.0)
Monocytes Relative: 6 %
Neutro Abs: 9 K/uL — ABNORMAL HIGH (ref 1.7–7.7)
Neutrophils Relative %: 74 %
Platelets: 321 K/uL (ref 150–400)
RBC: 4.59 MIL/uL (ref 3.87–5.11)
RDW: 12.6 % (ref 11.5–15.5)
WBC: 12.4 K/uL — ABNORMAL HIGH (ref 4.0–10.5)
nRBC: 0 % (ref 0.0–0.2)

## 2024-04-21 LAB — URINALYSIS, ROUTINE W REFLEX MICROSCOPIC
Bilirubin Urine: NEGATIVE
Glucose, UA: NEGATIVE mg/dL
Hgb urine dipstick: NEGATIVE
Ketones, ur: NEGATIVE mg/dL
Leukocytes,Ua: NEGATIVE
Nitrite: NEGATIVE
Protein, ur: NEGATIVE mg/dL
Specific Gravity, Urine: 1.01 (ref 1.005–1.030)
pH: 6 (ref 5.0–8.0)

## 2024-04-21 MED ORDER — SODIUM CHLORIDE 0.9 % IV BOLUS
1000.0000 mL | Freq: Once | INTRAVENOUS | Status: AC
  Administered 2024-04-21: 1000 mL via INTRAVENOUS

## 2024-04-21 MED ORDER — ONDANSETRON HCL 4 MG/2ML IJ SOLN
4.0000 mg | Freq: Once | INTRAMUSCULAR | Status: AC
  Administered 2024-04-21: 4 mg via INTRAVENOUS
  Filled 2024-04-21: qty 2

## 2024-04-21 MED ORDER — SODIUM CHLORIDE 0.9 % IV SOLN: INTRAVENOUS | Status: DC

## 2024-04-21 MED ORDER — LIDOCAINE 5 % EX PTCH
1.0000 | MEDICATED_PATCH | CUTANEOUS | Status: DC
  Administered 2024-04-21: 1 via TRANSDERMAL
  Filled 2024-04-21 (×2): qty 1

## 2024-04-21 MED ORDER — HYDROMORPHONE HCL 1 MG/ML IJ SOLN
1.0000 mg | Freq: Once | INTRAMUSCULAR | Status: AC
  Administered 2024-04-21: 1 mg via INTRAVENOUS
  Filled 2024-04-21: qty 1

## 2024-04-21 MED ORDER — IOHEXOL 300 MG/ML  SOLN
100.0000 mL | Freq: Once | INTRAMUSCULAR | Status: AC | PRN
  Administered 2024-04-21: 100 mL via INTRAVENOUS

## 2024-04-21 MED ORDER — METHOCARBAMOL 1000 MG/10ML IJ SOLN
1000.0000 mg | Freq: Once | INTRAMUSCULAR | Status: AC
  Administered 2024-04-21: 1000 mg via INTRAVENOUS
  Filled 2024-04-21: qty 10

## 2024-04-21 MED ORDER — PIPERACILLIN-TAZOBACTAM 3.375 G IVPB 30 MIN
3.3750 g | Freq: Once | INTRAVENOUS | Status: AC
  Administered 2024-04-21: 3.375 g via INTRAVENOUS
  Filled 2024-04-21: qty 50

## 2024-04-21 NOTE — ED Notes (Signed)
 Patient transported to CT

## 2024-04-21 NOTE — Progress Notes (Signed)
 Plan of Care Note for accepted transfer   Patient: Alexandria Bradley MRN: [id number]   DOA: 04/21/2024  Facility requesting transfer: [facility]   Requesting Provider: Leita Chancy, PA   Reason for transfer: Diverticulitis   Facility course: 63 yr old female with HTN, hypothyroidism, and recurrent diverticulitis presents with increased abdominal pain despite taking Augmentin  for diverticulitis.   She had improved initially with Augmentin  but now worsening again. She is afebrile with stable vitals, WBC 12,400, and has CT findings consistent with mild diverticulitis. She was started on Zosyn .   Plan of care: The patient is accepted for admission to Med-surg  unit, at [facility].   Author: Evalene GORMAN Sprinkles, MD 04/21/2024  Check [email or web address] for on-call coverage.  Nursing staff, Please call [facility] Admits & Consults System-Wide number on Amion as soon as patient's arrival, so appropriate admitting provider can evaluate the pt.

## 2024-04-21 NOTE — H&P (Signed)
 History and Physical  Alexandria Bradley FMW:[id number] DOB: [birth year] DOA: 04/21/2024  PCP: [name], DO   Chief Complaint: Abdominal pain  HPI: Alexandria Bradley is a 63 y.o. female with medical history significant for HTN, gout, breast cancer, anxiety, arthritis, PVCs, hypothyroidism, HLD, GERD and recurrent diverticulitis who presented to med [facility] ED for evaluation of abdominal pain.  Patient reports she returned from a vacation prematurely last week due to abdominal pain.  She was diagnosed with exacerbation of her diverticulitis by her GI provider and placed on Augmentin .  Her abdominal pain improved and she had a follow-up with her GI doctor and PCP earlier this week.  This morning, she woke up with significant pain in her left lower quadrant described as stabbing pain that radiated to her back. She reports feeling sick to her stomach with some chills but denies any fevers, vomiting, headache, dizziness, constipation or diarrhea.  ED Course: Initial vitals show patient afebrile and normotensive. Initial labs significant for WBC 12.4, otherwise normal CMP, UA, hemoglobin and platelet.  CT A/P shows mildly inflamed diverticulum within the distal descending colon consistent with mild diverticulitis.  Pt received IV NS 1 L bolus x 2, IV Zofran , IV Robaxin , IV Dilaudid  and IV Zosyn .  Patient was admitted to [facility] service and transferred to [facility].  Review of Systems: Please see HPI for pertinent positives and negatives. A complete 10 system review of systems are otherwise negative.  Past Medical History:  Diagnosis Date   Allergy    Anxiety    Arthritis    Back pain    Breast cancer (HCC)    left   Fatty liver    GERD (gastroesophageal reflux disease)    Gout    Headache(784.0)    PMH : migraines   History of kidney stones    Hypercholesterolemia    Hypertension    Hypothyroidism    Osteoporosis    Personal history of chemotherapy    Personal  history of radiation therapy    PONV (postoperative nausea and vomiting)    difficult to wake up   Pre-diabetes    Psoriasis    PVC's (premature ventricular contractions)    Radiation 08/18/14-10/07/14   Left breast   Sleep apnea    no CPAP   Past Surgical History:  Procedure Laterality Date   BREAST BIOPSY Left 11/10/2018   BREAST LUMPECTOMY Left 2015   BREAST LUMPECTOMY WITH AXILLARY LYMPH NODE BIOPSY  02/2014   left   BREAST RECONSTRUCTION WITH PLACEMENT OF TISSUE EXPANDER AND ALLODERM Bilateral 04/12/2019   Procedure: BILATERAL BREAST RECONSTRUCTION WITH PLACEMENT OF TISSUE EXPANDERS; ALLODERM TO RIGHT CHEST;  Surgeon: Arelia Filippo, MD;  Location: [facility] OR;  Service: Plastics;  Laterality: Bilateral;   BREAST RECONSTRUCTION WITH PLACEMENT OF TISSUE EXPANDER AND ALLODERM Right 01/24/2020   Procedure: BREAST RECONSTRUCTION WITH PLACEMENT OF TISSUE EXPANDER AND ALLODERM;  Surgeon: Arelia Filippo, MD;  Location: [facility];  Service: Plastics;  Laterality: Right;   CARDIAC CATHETERIZATION     CHOLECYSTECTOMY     colon polyps     COLONOSCOPY N/A 06/27/2014   Procedure: COLONOSCOPY;  Surgeon: Jerrell KYM Sol, MD;  Location: [facility] ENDOSCOPY;  Service: Endoscopy;  Laterality: N/A;   COLONOSCOPY  02/2020   LATISSIMUS FLAP TO BREAST Left 04/12/2019   Procedure: LEFT LATISSIMUS DORSI FLAP TO LEFT CHEST;  Surgeon: Arelia Filippo, MD;  Location: [facility] OR;  Service: Plastics;  Laterality: Left;   LEFT HEART CATH AND  CORONARY ANGIOGRAPHY N/A 07/24/2018   Procedure: LEFT HEART CATH AND CORONARY ANGIOGRAPHY;  Surgeon: Burnard Debby LABOR, MD;  Location: [facility] INVASIVE CV LAB;  Service: Cardiovascular;  Laterality: N/A;   LIPOMA EXCISION     LIPOSUCTION WITH LIPOFILLING Bilateral 06/12/2020   Procedure: LIPOFILLING TO BILATERAL CHEST;  Surgeon: Arelia Filippo, MD;  Location: [facility];  Service: Plastics;  Laterality: Bilateral;   MASTECTOMY W/ SENTINEL NODE BIOPSY  Bilateral 04/12/2019   Procedure: RIGHT BREAST RISK REDUCING MASTECTOMY; LEFT MASTECTOMY WITH LEFT AXILLARY SENTINEL LYMPH NODE BIOPSY WITH BLUE DYE INJECTION;  Surgeon: Ebbie Cough, MD;  Location: [facility];  Service: General;  Laterality: Bilateral;   MYOMECTOMY     PORT-A-CATH REMOVAL N/A 02/03/2015   Procedure: REMOVAL PORT-A-CATH;  Surgeon: Cough Ebbie, MD;  Location: [facility];  Service: General;  Laterality: N/A;   PORT-A-CATH REMOVAL Right 01/24/2020   Procedure: REMOVAL PORT-A-CATH;  Surgeon: Arelia Filippo, MD;  Location: [facility];  Service: Plastics;  Laterality: Right;   PORTACATH PLACEMENT N/A 03/01/2014   Procedure: INSERTION PORT-A-CATH;  Surgeon: Cough Ebbie, MD;  Location: [facility];  Service: General;  Laterality: N/A;   PORTACATH PLACEMENT N/A 11/18/2018   Procedure: INSERTION PORT-A-CATH WITH ULTRASOUND;  Surgeon: Ebbie Cough, MD;  Location: [facility];  Service: General;  Laterality: N/A;   REMOVAL OF BILATERAL TISSUE EXPANDERS WITH PLACEMENT OF BILATERAL BREAST IMPLANTS Bilateral 06/12/2020   Procedure: REMOVAL OF BILATERAL TISSUE EXPANDERS WITH PLACEMENT OF BILATERAL BREAST IMPLANTS;  Surgeon: Arelia Filippo, MD;  Location: [facility];  Service: Plastics;  Laterality: Bilateral;   REMOVAL OF TISSUE EXPANDER AND PLACEMENT OF IMPLANT Right 06/02/2019   Procedure: REMOVAL OF TISSUE EXPANDER RIGHT CHEST;  Surgeon: Arelia Filippo, MD;  Location: [facility];  Service: Plastics;  Laterality: Right;   TONSILLECTOMY     TUBAL LIGATION     WOUND DEBRIDEMENT Left 06/02/2019   Procedure: LEFT CHEST DEBRIDEMENT;  Surgeon: Arelia Filippo, MD;  Location:  SURGERY CENTER;  Service: Plastics;  Laterality: Left;   Social History:  reports that she has quit smoking. Her smoking use included cigarettes. She has a 7.5 pack-year smoking history. She has never used smokeless  tobacco. She reports that she does not currently use alcohol. She reports that she does not use drugs.  Allergies  Allergen Reactions   Chlorhexidine  Itching   Ciprofloxacin  Anxiety, Other (See Comments), Rash and Swelling    GI upset   Prednisone  Shortness Of Breath and Other (See Comments)    Jittery, jacks me up     Codeine Nausea Only   Cortisone Swelling and Other (See Comments)    hyper sensitive, jacks me up    Colchicine  Nausea Only    Family History  Problem Relation Age of Onset   Endometrial cancer Mother 65   Hearing loss Mother    Atrial fibrillation Mother    Lung cancer Father    Brain cancer Father    Barrett's esophagus Sister    Heart disease Maternal Aunt    Atrial fibrillation Maternal Aunt    Lung cancer Maternal Uncle    Atrial fibrillation Maternal Uncle    Bladder Cancer Maternal Uncle    Multiple myeloma Maternal Uncle    Stroke Maternal Grandmother    Stroke Maternal Grandfather    Other Son        trisomy 13   Colon polyps Cousin    Colon cancer Neg Hx  Esophageal cancer Neg Hx    Rectal cancer Neg Hx    Stomach cancer Neg Hx      Prior to Admission medications   Medication Sig Start Date End Date Taking? Authorizing Provider  albuterol  (VENTOLIN  HFA) 108 (90 Base) MCG/ACT inhaler Inhale 2 puffs into the lungs every 6 (six) hours as needed for wheezing or shortness of breath (Cough). 09/24/22   Joesph Shaver Scales, PA-C  allopurinol  (ZYLOPRIM ) 100 MG tablet TAKE 1 TABLET BY MOUTH EVERY DAY 03/15/24   [name], DO  amoxicillin -clavulanate (AUGMENTIN ) 875-125 MG tablet Take 1 tablet by mouth every 12 (twelve) hours. 04/19/24   Armbruster, Elspeth SQUIBB, MD  azelastine  (ASTELIN ) 0.1 % nasal spray Place 2 sprays into both nostrils 2 (two) times daily. Use in each nostril as directed 08/07/23   Patel, Kunjan B, MD  betamethasone dipropionate 0.05 % cream Apply 1 Application topically daily. 04/07/23   [provider]   dicyclomine  (BENTYL ) 10 MG capsule Take 1 capsule (10 mg total) by mouth every 8 (eight) hours as needed for spasms. 04/19/24   [name], MD  diltiazem  (CARDIZEM  CD) 240 MG 24 hr capsule TAKE 1 CAPSULE BY MOUTH EVERY DAY 03/15/24   Wendling, Mabel Mt, DO  fexofenadine (ALLEGRA) 180 MG tablet Take 180 mg by mouth daily.    [provider]  Levothyroxine  Sodium (TIROSINT ) 150 MCG CAPS Take 150 mcg by mouth daily before breakfast.    [provider]  liothyronine  (CYTOMEL ) 5 MCG tablet Take 5 mcg by mouth daily before breakfast.  11/13/13   [provider]  mometasone  (NASONEX ) 50 MCG/ACT nasal spray PLACE 2 SPRAYS INTO THE NOSE DAILY. 02/27/24   [name], DO  ondansetron  (ZOFRAN -ODT) 4 MG disintegrating tablet Take 1 tablet (4 mg total) by mouth every 8 (eight) hours as needed. 04/12/24   Dean Clarity, MD  potassium chloride  (KLOR-CON ) 10 MEQ tablet TAKE 1 TABLET BY MOUTH EVERY DAY 03/15/24   [name], DO  triamterene -hydrochlorothiazide  (MAXZIDE -25) 37.5-25 MG tablet Take 1 tablet by mouth daily. 06/17/23   [name], DO  prochlorperazine  (COMPAZINE ) 10 MG tablet Take 1 tablet (10 mg total) by mouth every 6 (six) hours as needed (Nausea or vomiting). 11/13/18 05/11/19  Magrinat, Sandria BROCKS, MD    Physical Exam: BP 135/69   Pulse 69   Temp 97.8 F (36.6 C) (Oral)   Resp 18   Ht 5' 5 (1.651 m)   Wt 72.1 kg   SpO2 99%   BMI 26.46 kg/m  General: Pleasant, well-appearing elderly woman laying in bed. No acute distress. HEENT: [place]/AT. Anicteric sclera CV: RRR. No murmurs, rubs, or gallops. No LE edema Pulmonary: Lungs CTAB. Normal effort. No wheezing or rales. Abdominal: Soft, nondistended. Moderate ttp of LLQ. Normal bowel sounds. Extremities: Palpable radial and DP pulses. Normal ROM. Skin: Warm and dry. No obvious rash or lesions. Neuro: A&Ox3. Moves all extremities. Normal sensation to light touch. No focal  deficit. Psych: Normal mood and affect          Labs on Admission:  Basic Metabolic Panel: Recent Labs  Lab 04/21/24 1748  NA 138  K 3.5  CL 101  CO2 23  GLUCOSE 98  BUN 9  CREATININE 0.78  CALCIUM  10.3   Liver Function Tests: Recent Labs  Lab 04/21/24 1748  AST 22  ALT 22  ALKPHOS 105  BILITOT 0.4  PROT 8.2*  ALBUMIN 4.9   No results for input(s): LIPASE,  AMYLASE in the last 168 hours. No results for input(s): AMMONIA in the last 168 hours. CBC: Recent Labs  Lab 04/21/24 1748  WBC 12.4*  NEUTROABS 9.0*  HGB 13.8  HCT 40.5  MCV 88.2  PLT 321   Cardiac Enzymes: No results for input(s): CKTOTAL, CKMB, CKMBINDEX, TROPONINI in the last 168 hours. BNP (last 3 results) No results for input(s): BNP in the last 8760 hours.  ProBNP (last 3 results) No results for input(s): PROBNP in the last 8760 hours.  CBG: No results for input(s): GLUCAP in the last 168 hours.  Radiological Exams on Admission: CT ABDOMEN PELVIS W CONTRAST Result Date: 04/21/2024 CLINICAL DATA:  Left lower quadrant pain. EXAM: CT ABDOMEN AND PELVIS WITH CONTRAST TECHNIQUE: Multidetector CT imaging of the abdomen and pelvis was performed using the standard protocol following bolus administration of intravenous contrast. RADIATION DOSE REDUCTION: This exam was performed according to the departmental dose-optimization program which includes automated exposure control, adjustment of the mA and/or kV according to patient size and/or use of iterative reconstruction technique. CONTRAST:  OMNIPAQUE  IOHEXOL  300 MG/ML  SOLN COMPARISON:  April 12, 2024 FINDINGS: Lower chest: No acute abnormality. Bilateral breast implants are noted. Hepatobiliary: There is diffuse fatty infiltration of the liver parenchyma. No focal liver abnormality is seen. Status post cholecystectomy. No biliary dilatation. Pancreas: Unremarkable. No pancreatic ductal dilatation or surrounding inflammatory changes.  Spleen: Normal in size without focal abnormality. Adrenals/Urinary Tract: Adrenal glands are unremarkable. Kidneys are normal in size, without renal calculi or hydronephrosis. A subcentimeter simple cyst is seen within the left kidney. Bladder is unremarkable. Stomach/Bowel: Stomach is within normal limits. Appendix appears normal. No evidence of bowel wall thickening, distention, or inflammatory changes. A mildly inflamed diverticulum is seen within the distal descending colon. Vascular/Lymphatic: Aortic atherosclerosis. No enlarged abdominal or pelvic lymph nodes. Reproductive: Uterus and bilateral adnexa are unremarkable. Other: No abdominal wall hernia or abnormality. No abdominopelvic ascites. Musculoskeletal: No acute or significant osseous findings. IMPRESSION: 1. Mildly inflamed diverticulum within the distal descending colon, consistent with mild diverticulitis. 2. Hepatic steatosis. 3. Evidence of prior cholecystectomy. 4. Aortic atherosclerosis. Electronically Signed   By: Suzen Dials M.D.   On: 04/21/2024 19:20   Assessment/Plan Alexandria Bradley is a 63 y.o. female with medical history significant for  HTN, gout, breast cancer, anxiety, arthritis, PVCs, hypothyroidism, HLD, GERD and recurrent diverticulitis who presented to med [facility] ED for evaluation of abdominal pain.    # Acute diverticulitis # Hx of recurrent diverticulitis - Has had a history of sepsis secondary to diverticulitis and diverticulitis with microperforation - Presented with acute onset of abdominal pain and found to have recurrent mild diverticulitis without perforation - Patient nontoxic-appearing, afebrile with mild leukocytosis - Reports she has had conversations with her GI provider and surgery concerning possible surgical intervention in the future - Continue IV Zosyn  - Pain control with PRN Bentyl  and IV Dilaudid  - Start IV NS 100 cc/h - Trend CBC, fever curve  # HTN - BP stable with SBP in  100s-140s - Continue on triamterene -HCTZ and diltiazem   #Hx of PVCs - HR stable in the 60s to 70s - Continue diltiazem   # Hypothyroidism # Hashimoto's thyroiditis - Continue Cytomel  and levothyroxine   # Gout - Continue allopurinol    DVT prophylaxis: Lovenox      Code Status: Full Code  Consults called: None  Family Communication: No family at bedside  Severity of Illness: The appropriate patient status for this patient is OBSERVATION. Observation  status is judged to be reasonable and necessary in order to provide the required intensity of service to ensure the patient's safety. The patient's presenting symptoms, physical exam findings, and initial radiographic and laboratory data in the context of their medical condition is felt to place them at decreased risk for further clinical deterioration. Furthermore, it is anticipated that the patient will be medically stable for discharge from the hospital within 2 midnights of admission.   Level of care: Med-Surg   This record has been created using Conservation officer, historic buildings. Errors have been sought and corrected, but may not always be located. Such creation errors do not reflect on the standard of care.   Lou Claretta HERO, MD 04/22/2024, 1:34 AM [facility] Pager: [phone] Isaiah 41:10   If 7PM-7AM, please contact night-coverage [email or web address] Password [email or web address]

## 2024-04-21 NOTE — ED Provider Notes (Signed)
 63 yo female with hx of diverticulitis with perf requiring admission for sepsis about 1 year ago. Had reoccurrence 04/12/24 with diverticulitis, given Augmentin .  Followed up with GI and PCP, was doing better. Today with sig LLQ pain, feels like perf pain. Pending CT. WBC elevated. Completed abx, GI provided with additional course of Augmentin  in case she had another flare and she did start this last night.  Physical Exam  BP 130/68   Pulse 67   Temp 98.2 F (36.8 C)   Resp 18   Ht 5' 5 (1.651 m)   Wt 72.1 kg   SpO2 100%   BMI 26.46 kg/m   Physical Exam  Procedures  Procedures  ED Course / MDM    Medical Decision Making Amount and/or Complexity of Data Reviewed Labs: ordered. Radiology: ordered.  Risk Prescription drug management. Decision regarding hospitalization.   CT with diverticulitis without perforation or abscess.  Patient remains fairly uncomfortable despite Dilaudid .  Will try Robaxin  and Lidoderm  patch.  Recommend admission for failed outpatient therapy, patient is agreeable with plan.  Discussed with Dr. Charlton with [facility] service who will request admission.        Beverley Leita LABOR, PA-C 04/21/24 2007    Long, Joshua G, MD 04/23/24 1049

## 2024-04-21 NOTE — ED Provider Notes (Signed)
 [place] [facility] AT [facility] Provider Note   CSN: [id number] Arrival date & time: 04/21/24  1539     Patient presents with: Abdominal Pain   Alexandria Bradley is a 63 y.o. female.   Patient complains of left lower quadrant abdominal pain nausea and feeling bad overall.  Patient was recently diagnosed with an exacerbation of diverticulitis.  She is currently taking Augmentin .  Patient states that her pain was improving she saw all her gastroenterologist and her primary care doctor this week and felt like she was getting better.  Patient reports the pain is worse today.  She complains of nausea.  Patient has a past medical history of a perforation secondary to her diverticulitis.  The pain she is currently having is similar to that pain.  She reports pain radiating into her back.  The history is provided by the patient. No language interpreter was used.  Abdominal Pain Associated symptoms: nausea   Associated symptoms: no vomiting        Prior to Admission medications   Medication Sig Start Date End Date Taking? Authorizing Provider  albuterol  (VENTOLIN  HFA) 108 (90 Base) MCG/ACT inhaler Inhale 2 puffs into the lungs every 6 (six) hours as needed for wheezing or shortness of breath (Cough). 09/24/22   Joesph Shaver Scales, PA-C  allopurinol  (ZYLOPRIM ) 100 MG tablet TAKE 1 TABLET BY MOUTH EVERY DAY 03/15/24   [name], DO  amoxicillin -clavulanate (AUGMENTIN ) 875-125 MG tablet Take 1 tablet by mouth every 12 (twelve) hours. 04/19/24   Armbruster, Elspeth SQUIBB, MD  azelastine  (ASTELIN ) 0.1 % nasal spray Place 2 sprays into both nostrils 2 (two) times daily. Use in each nostril as directed 08/07/23   Patel, Kunjan B, MD  betamethasone dipropionate 0.05 % cream Apply 1 Application topically daily. 04/07/23   [provider]  dicyclomine  (BENTYL ) 10 MG capsule Take 1 capsule (10 mg total) by mouth every 8 (eight) hours as needed for spasms.  04/19/24   [name], MD  diltiazem  (CARDIZEM  CD) 240 MG 24 hr capsule TAKE 1 CAPSULE BY MOUTH EVERY DAY 03/15/24   Wendling, Mabel Mt, DO  fexofenadine (ALLEGRA) 180 MG tablet Take 180 mg by mouth daily.    [provider]  Levothyroxine  Sodium (TIROSINT ) 150 MCG CAPS Take 150 mcg by mouth daily before breakfast.    [provider]  liothyronine  (CYTOMEL ) 5 MCG tablet Take 5 mcg by mouth daily before breakfast.  11/13/13   [provider]  mometasone  (NASONEX ) 50 MCG/ACT nasal spray PLACE 2 SPRAYS INTO THE NOSE DAILY. 02/27/24   [name], DO  ondansetron  (ZOFRAN -ODT) 4 MG disintegrating tablet Take 1 tablet (4 mg total) by mouth every 8 (eight) hours as needed. 04/12/24   Dean Clarity, MD  potassium chloride  (KLOR-CON ) 10 MEQ tablet TAKE 1 TABLET BY MOUTH EVERY DAY 03/15/24   [name], DO  triamterene -hydrochlorothiazide  (MAXZIDE -25) 37.5-25 MG tablet Take 1 tablet by mouth daily. 06/17/23   [name], DO  prochlorperazine  (COMPAZINE ) 10 MG tablet Take 1 tablet (10 mg total) by mouth every 6 (six) hours as needed (Nausea or vomiting). 11/13/18 05/11/19  Magrinat, Sandria BROCKS, MD    Allergies: Chlorhexidine , Ciprofloxacin , Prednisone , Codeine, Cortisone, and Colchicine     Review of Systems  Gastrointestinal:  Positive for abdominal pain and nausea. Negative for vomiting.  All other systems reviewed and are negative.   Updated Vital Signs BP (!) 146/67 (BP Location: Left Arm)   Pulse 77  Temp 98.2 F (36.8 C) (Oral)   Resp 18   Ht 5' 5 (1.651 m)   Wt 72.1 kg   SpO2 99%   BMI 26.46 kg/m   Physical Exam Vitals and nursing note reviewed.  Constitutional:      Appearance: She is well-developed.  HENT:     Head: Normocephalic.     Mouth/Throat:     Mouth: Mucous membranes are moist.  Eyes:     Extraocular Movements: Extraocular movements intact.  Cardiovascular:     Rate and Rhythm: Normal rate.   Pulmonary:     Effort: Pulmonary effort is normal.  Abdominal:     General: Abdomen is flat. Bowel sounds are normal. There is no distension.     Palpations: Abdomen is soft.     Tenderness: There is abdominal tenderness in the left lower quadrant.  Musculoskeletal:        General: Normal range of motion.     Cervical back: Normal range of motion.  Skin:    General: Skin is warm.  Neurological:     General: No focal deficit present.     Mental Status: She is alert and oriented to person, place, and time.  Psychiatric:        Mood and Affect: Mood normal.     (all labs ordered are listed, but only abnormal results are displayed) Labs Reviewed  CBC WITH DIFFERENTIAL/PLATELET - Abnormal; Notable for the following components:      Result Value   WBC 12.4 (*)    Neutro Abs 9.0 (*)    All other components within normal limits  COMPREHENSIVE METABOLIC PANEL WITH GFR - Abnormal; Notable for the following components:   Total Protein 8.2 (*)    All other components within normal limits  URINALYSIS, ROUTINE W REFLEX MICROSCOPIC    EKG: None  Radiology: No results found.   Procedures   Medications Ordered in the ED  HYDROmorphone  (DILAUDID ) injection 1 mg (has no administration in time range)  ondansetron  (ZOFRAN ) injection 4 mg (has no administration in time range)  sodium chloride  0.9 % bolus 1,000 mL (1,000 mLs Intravenous New Bag/Given 04/21/24 1753)  iohexol  (OMNIPAQUE ) 300 MG/ML solution 100 mL (100 mLs Intravenous Contrast Given 04/21/24 1837)                                    Medical Decision Making Patient complains of left lower quadrant abdominal pain.  Patient has a history of diverticulitis with a perforation.  Patient reports she was started on Augmentin  and was feeling better.  Patient reports symptoms became acutely worse today.  Amount and/or Complexity of Data Reviewed Independent Historian: spouse    Details: Patient is here with spouse who is  supportive External Data Reviewed: notes.    Details: Previous ED notes reviewed, primary care notes reviewed, GI notes reviewed Labs: ordered. Decision-making details documented in ED Course.    Details: Labs ordered reviewed and interpreted patient has white blood cell count of 12.4. Radiology: ordered and independent interpretation performed. Decision-making details documented in ED Course.    Details: CT abdomen and pelvis pending  Risk Risk Details: Patient given IV fluids, Dilaudid  and Zofran . Pt's care turned over to Leita Molly [facility]        Final diagnoses:  Generalized abdominal pain    ED Discharge Orders     None  [place]int Sonny POUR, PA-C 04/21/24 1900    Long, Joshua G, MD 04/23/24 1049

## 2024-04-21 NOTE — ED Triage Notes (Signed)
 Pt complaints LLQ abd pain radiating to back that started today. Pt states she finished the antibiotics with no relief. Endorses diarrhea   Hx of diverticulitis. Reports chills

## 2024-04-21 NOTE — ED Notes (Addendum)
 Department call and receiving nurse is aware that patient will be picked up by [facility] in 5 min to be transported to 1315.

## 2024-04-22 DIAGNOSIS — K5732 Diverticulitis of large intestine without perforation or abscess without bleeding: Secondary | ICD-10-CM | POA: Diagnosis not present

## 2024-04-22 DIAGNOSIS — K5792 Diverticulitis of intestine, part unspecified, without perforation or abscess without bleeding: Secondary | ICD-10-CM | POA: Diagnosis not present

## 2024-04-22 DIAGNOSIS — E876 Hypokalemia: Secondary | ICD-10-CM

## 2024-04-22 LAB — BASIC METABOLIC PANEL WITH GFR
Anion gap: 10 (ref 5–15)
BUN: 7 mg/dL — ABNORMAL LOW (ref 8–23)
CO2: 21 mmol/L — ABNORMAL LOW (ref 22–32)
Calcium: 9 mg/dL (ref 8.9–10.3)
Chloride: 104 mmol/L (ref 98–111)
Creatinine, Ser: 0.67 mg/dL (ref 0.44–1.00)
GFR, Estimated: >60 mL/min (ref >60)
Glucose, Bld: 105 mg/dL — ABNORMAL HIGH (ref 70–99)
Potassium: 3.1 mmol/L — ABNORMAL LOW (ref 3.5–5.1)
Sodium: 135 mmol/L (ref 135–145)

## 2024-04-22 LAB — CBC
HCT: 37 % (ref 36.0–46.0)
Hemoglobin: 12 g/dL (ref 12.0–15.0)
MCH: 29.9 pg (ref 26.0–34.0)
MCHC: 32.4 g/dL (ref 30.0–36.0)
MCV: 92.3 fL (ref 80.0–100.0)
Platelets: 243 K/uL (ref 150–400)
RBC: 4.01 MIL/uL (ref 3.87–5.11)
RDW: 12.7 % (ref 11.5–15.5)
WBC: 7.7 K/uL (ref 4.0–10.5)
nRBC: 0 % (ref 0.0–0.2)

## 2024-04-22 LAB — HIV ANTIBODY (ROUTINE TESTING W REFLEX): HIV Screen 4th Generation wRfx: NONREACTIVE

## 2024-04-22 LAB — MAGNESIUM: Magnesium: 2 mg/dL (ref 1.7–2.4)

## 2024-04-22 MED ORDER — POTASSIUM CHLORIDE ER 10 MEQ PO TBCR: 10.0000 meq | EXTENDED_RELEASE_TABLET | Freq: Every day | ORAL | Status: DC

## 2024-04-22 MED ORDER — ENOXAPARIN SODIUM 40 MG/0.4ML IJ SOSY
40.0000 mg | PREFILLED_SYRINGE | INTRAMUSCULAR | Status: DC
  Administered 2024-04-22: 40 mg via SUBCUTANEOUS
  Filled 2024-04-22: qty 0.4

## 2024-04-22 MED ORDER — LEVOTHYROXINE SODIUM 75 MCG PO TABS
150.0000 ug | ORAL_TABLET | Freq: Every day | ORAL | Status: DC
  Administered 2024-04-22: 150 ug via ORAL
  Filled 2024-04-22: qty 2

## 2024-04-22 MED ORDER — MOMETASONE FUROATE 50 MCG/ACT NA SUSP: 1.0000 | Freq: Every day | NASAL | Status: AC | PRN

## 2024-04-22 MED ORDER — DILTIAZEM HCL ER COATED BEADS 240 MG PO CP24
240.0000 mg | ORAL_CAPSULE | Freq: Every day | ORAL | Status: DC
  Filled 2024-04-22: qty 1

## 2024-04-22 MED ORDER — ONDANSETRON HCL 4 MG/2ML IJ SOLN
4.0000 mg | Freq: Four times a day (QID) | INTRAMUSCULAR | Status: DC | PRN
  Administered 2024-04-22: 4 mg via INTRAVENOUS
  Filled 2024-04-22: qty 2

## 2024-04-22 MED ORDER — ALLOPURINOL 100 MG PO TABS
100.0000 mg | ORAL_TABLET | Freq: Every day | ORAL | Status: DC
  Administered 2024-04-22: 100 mg via ORAL
  Filled 2024-04-22: qty 1

## 2024-04-22 MED ORDER — DICYCLOMINE HCL 10 MG PO CAPS
10.0000 mg | ORAL_CAPSULE | Freq: Three times a day (TID) | ORAL | Status: DC | PRN
  Administered 2024-04-22: 10 mg via ORAL
  Filled 2024-04-22: qty 1

## 2024-04-22 MED ORDER — TRIAMTERENE-HCTZ 37.5-25 MG PO TABS
1.0000 | ORAL_TABLET | Freq: Every day | ORAL | Status: DC
  Administered 2024-04-22: 1 via ORAL
  Filled 2024-04-22: qty 1

## 2024-04-22 MED ORDER — SENNOSIDES-DOCUSATE SODIUM 8.6-50 MG PO TABS: 1.0000 | ORAL_TABLET | Freq: Every evening | ORAL | Status: DC | PRN

## 2024-04-22 MED ORDER — POTASSIUM CHLORIDE ER 10 MEQ PO TBCR
10.0000 meq | EXTENDED_RELEASE_TABLET | Freq: Every day | ORAL | Status: DC
  Filled 2024-04-22: qty 1

## 2024-04-22 MED ORDER — ACETAMINOPHEN 650 MG RE SUPP: 650.0000 mg | Freq: Four times a day (QID) | RECTAL | Status: DC | PRN

## 2024-04-22 MED ORDER — AMOXICILLIN-POT CLAVULANATE 875-125 MG PO TABS: 1.0000 | ORAL_TABLET | Freq: Two times a day (BID) | ORAL | 0 refills | Status: AC

## 2024-04-22 MED ORDER — AZELASTINE HCL 0.1 % NA SOLN: 1.0000 | Freq: Every day | NASAL | Status: AC | PRN

## 2024-04-22 MED ORDER — LIOTHYRONINE SODIUM 5 MCG PO TABS
5.0000 ug | ORAL_TABLET | Freq: Every day | ORAL | Status: DC
  Administered 2024-04-22: 5 ug via ORAL
  Filled 2024-04-22: qty 1

## 2024-04-22 MED ORDER — ONDANSETRON HCL 4 MG PO TABS: 4.0000 mg | ORAL_TABLET | Freq: Four times a day (QID) | ORAL | Status: DC | PRN

## 2024-04-22 MED ORDER — PIPERACILLIN-TAZOBACTAM 3.375 G IVPB
3.3750 g | Freq: Three times a day (TID) | INTRAVENOUS | Status: DC
  Administered 2024-04-22 (×2): 3.375 g via INTRAVENOUS
  Filled 2024-04-22 (×2): qty 50

## 2024-04-22 MED ORDER — ACETAMINOPHEN 325 MG PO TABS: 650.0000 mg | ORAL_TABLET | Freq: Four times a day (QID) | ORAL | Status: DC | PRN

## 2024-04-22 MED ORDER — POTASSIUM CHLORIDE 20 MEQ PO PACK
40.0000 meq | PACK | ORAL | Status: AC
  Administered 2024-04-22 (×2): 40 meq via ORAL
  Filled 2024-04-22 (×2): qty 2

## 2024-04-22 MED ORDER — HYDROMORPHONE HCL 1 MG/ML IJ SOLN: 0.5000 mg | Freq: Four times a day (QID) | INTRAMUSCULAR | Status: DC | PRN

## 2024-04-22 MED ORDER — AZELASTINE HCL 0.1 % NA SOLN
2.0000 | Freq: Two times a day (BID) | NASAL | Status: DC
  Filled 2024-04-22: qty 30

## 2024-04-22 NOTE — Care Management Obs Status (Signed)
 MEDICARE OBSERVATION STATUS NOTIFICATION   Patient Details  Name: SERRINA MINOGUE MRN: [id number] Date of Birth: Sep 27, 1961   Medicare Observation Status Notification Given:  Yes    Alfonse JONELLE Rex, RN 04/22/2024, 1:27 PM

## 2024-04-22 NOTE — Consult Note (Addendum)
 Referring Provider: Dr. Trenda Mar  Primary Care Physician:  [name], DO Primary Gastroenterologist:  Dr. Leigh  Reason for Consultation: Recurrent diverticulitis  HPI: Alexandria Bradley is a 63 y.o. female with a past medical history of anxiety breast cancer, hypertension, hypothyroid sleep apnea, GERD, lymphocytic colitis per colonoscopy 02/2020 and recurrent diverticulitis microperforation.  She was recently seen by Dr. Leigh in our outpatient GI clinic 04/19/2024 due to having left lower quadrant abdominal pain which occurred while she was on vacation. She returned home and went to the ED 04/12/2024 CTAP showed left-sided diverticulitis without complications and she was prescribed a course of Augmentin  875 mg twice daily for 1 week, last dose completed 7/21. LLQ pain significantly improved after completing the course of Augmentin .  She developed nausea with current LLQ pain presented to the ED 04/21/2024 evaluation regarding recurrent versus smoldering diverticulitis.  Labs in the ED showed a WBC count of 12.4.  Hemoglobin 13.8.  BUN 9.  Creatinine 0.78.  Normal LFTs.  CTAP with contrast showed mildly inflamed diverticulum to the distal descending colon consistent with mild diverticulitis without abscess or perforation.  A GI consult was requested for further management of recurrent versus smoldering diverticulitis. She had recurrence of LLQ pain yesterday and overall felt sick. She endorses having 1 to 2 nonbloody loose stools daily while on Augmentin . Typically passes a normal brown stools most days. No rectal bleeding or black stools. She has as history of prior chronic diarrhea which was possibly due to Keytruda  in 2021.  She underwent a colonoscopy 02/2020 which showed left-sided diverticulosis and lymphocytic colitis. Her most recent colonoscopy 06/2023 showed diverticulosis in the left colon and one polyp was removed from the sigmoid colon, path report showed  polypoid inflammatory polyp.  No recent GERD symptoms.  No NSAID use.  She has had a few episodes of diverticulitis last year. She was admitted both times, once with concern for sepsis and other for concern for microperforation. Episodes localized to the sigmoid colon. She eventually resolved with treatment with antibiotics. She was seen by Dr. Teresa of colorectal surgery to discuss colonic resection after her hospitalization. She wished to avoid surgery if at all possible and declined after the visit. Her last episode which involved microperforation was in July 2024.    GI PROCEDURES:  Colonoscopy June 2021 - The examined portion of the ileum was normal.  - A single non-bleeding colonic angiodysplastic lesion.  - Mild diverticulosis in the left colon.  - Patchy mild erythema was seen in sigmoid colon and rectum. Biopsied.  - The examination was otherwise normal on direct and retroflexion views. Random biopsies obtained.  Path: lymphocytic colitis  Colonoscopy 06/10/2023: - The perianal and digital rectal examinations were normal.  - The terminal ileum appeared normal.  - A 7 mm polyp was found in the sigmoid colon. The polyp was sessile. The polyp was removed with a cold snare. Resection and retrieval were complete.  - Multiple medium-mouthed and small-mouthed diverticula were found in the left colon. There was evidence of diverticular spasm. There was evidence of an impacted diverticulum. There was no evidence of diverticular bleeding.  - The exam was otherwise without abnormality on direct and retroflexion views. Random biopsies obtained throughout.   FINAL DIAGNOSIS        1. Surgical [P], colon nos, random sites :       - UNREMARKABLE COLONIC MUCOSA.       - NEGATIVE FOR MICROSCOPIC COLITIS, DYSPLASIA, AND MALIGNANCY.  2. Surgical [P], colon, sigmoid, polyp (1) :       - POLYPOID FRAGMENT OF INFLAMED GRANULATION TISSUE/INFLAMMATORY POLYP.       - NEGATIVE FOR DYSPLASIA AND  MALIGNANCY.     Past Medical History:  Diagnosis Date   Allergy    Anxiety    Arthritis    Back pain    Breast cancer (HCC)    left   Fatty liver    GERD (gastroesophageal reflux disease)    Gout    Headache(784.0)    PMH : migraines   History of kidney stones    Hypercholesterolemia    Hypertension    Hypothyroidism    Osteoporosis    Personal history of chemotherapy    Personal history of radiation therapy    PONV (postoperative nausea and vomiting)    difficult to wake up   Pre-diabetes    Psoriasis    PVC's (premature ventricular contractions)    Radiation 08/18/14-10/07/14   Left breast   Sleep apnea    no CPAP    Past Surgical History:  Procedure Laterality Date   BREAST BIOPSY Left 11/10/2018   BREAST LUMPECTOMY Left 2015   BREAST LUMPECTOMY WITH AXILLARY LYMPH NODE BIOPSY  02/2014   left   BREAST RECONSTRUCTION WITH PLACEMENT OF TISSUE EXPANDER AND ALLODERM Bilateral 04/12/2019   Procedure: BILATERAL BREAST RECONSTRUCTION WITH PLACEMENT OF TISSUE EXPANDERS; ALLODERM TO RIGHT CHEST;  Surgeon: Arelia Filippo, MD;  Location: [facility] OR;  Service: Plastics;  Laterality: Bilateral;   BREAST RECONSTRUCTION WITH PLACEMENT OF TISSUE EXPANDER AND ALLODERM Right 01/24/2020   Procedure: BREAST RECONSTRUCTION WITH PLACEMENT OF TISSUE EXPANDER AND ALLODERM;  Surgeon: Arelia Filippo, MD;  Location: [facility];  Service: Plastics;  Laterality: Right;   CARDIAC CATHETERIZATION     CHOLECYSTECTOMY     colon polyps     COLONOSCOPY N/A 06/27/2014   Procedure: COLONOSCOPY;  Surgeon: Jerrell KYM Sol, MD;  Location: [facility] ENDOSCOPY;  Service: Endoscopy;  Laterality: N/A;   COLONOSCOPY  02/2020   LATISSIMUS FLAP TO BREAST Left 04/12/2019   Procedure: LEFT LATISSIMUS DORSI FLAP TO LEFT CHEST;  Surgeon: Arelia Filippo, MD;  Location: [facility];  Service: Plastics;  Laterality: Left;   LEFT HEART CATH AND CORONARY ANGIOGRAPHY N/A 07/24/2018   Procedure: LEFT HEART  CATH AND CORONARY ANGIOGRAPHY;  Surgeon: Burnard Debby LABOR, MD;  Location: [facility];  Service: Cardiovascular;  Laterality: N/A;   LIPOMA EXCISION     LIPOSUCTION WITH LIPOFILLING Bilateral 06/12/2020   Procedure: LIPOFILLING TO BILATERAL CHEST;  Surgeon: Arelia Filippo, MD;  Location: [facility];  Service: Plastics;  Laterality: Bilateral;   MASTECTOMY W/ SENTINEL NODE BIOPSY Bilateral 04/12/2019   Procedure: RIGHT BREAST RISK REDUCING MASTECTOMY; LEFT MASTECTOMY WITH LEFT AXILLARY SENTINEL LYMPH NODE BIOPSY WITH BLUE DYE INJECTION;  Surgeon: Ebbie Cough, MD;  Location: [facility];  Service: General;  Laterality: Bilateral;   MYOMECTOMY     PORT-A-CATH REMOVAL N/A 02/03/2015   Procedure: REMOVAL PORT-A-CATH;  Surgeon: Cough Ebbie, MD;  Location: [facility];  Service: General;  Laterality: N/A;   PORT-A-CATH REMOVAL Right 01/24/2020   Procedure: REMOVAL PORT-A-CATH;  Surgeon: Arelia Filippo, MD;  Location: [facility];  Service: Plastics;  Laterality: Right;   PORTACATH PLACEMENT N/A 03/01/2014   Procedure: INSERTION PORT-A-CATH;  Surgeon: Cough Ebbie, MD;  Location: [facility];  Service: General;  Laterality: N/A;   PORTACATH PLACEMENT N/A 11/18/2018   Procedure: INSERTION  PORT-A-CATH WITH ULTRASOUND;  Surgeon: Ebbie Cough, MD;  Location: [facility] OR;  Service: General;  Laterality: N/A;   REMOVAL OF BILATERAL TISSUE EXPANDERS WITH PLACEMENT OF BILATERAL BREAST IMPLANTS Bilateral 06/12/2020   Procedure: REMOVAL OF BILATERAL TISSUE EXPANDERS WITH PLACEMENT OF BILATERAL BREAST IMPLANTS;  Surgeon: Arelia Filippo, MD;  Location: [facility];  Service: Plastics;  Laterality: Bilateral;   REMOVAL OF TISSUE EXPANDER AND PLACEMENT OF IMPLANT Right 06/02/2019   Procedure: REMOVAL OF TISSUE EXPANDER RIGHT CHEST;  Surgeon: Arelia Filippo, MD;  Location: [facility];  Service: Plastics;   Laterality: Right;   TONSILLECTOMY     TUBAL LIGATION     WOUND DEBRIDEMENT Left 06/02/2019   Procedure: LEFT CHEST DEBRIDEMENT;  Surgeon: Arelia Filippo, MD;  Location: [facility];  Service: Plastics;  Laterality: Left;    Prior to Admission medications   Medication Sig Start Date End Date Taking? Authorizing Provider  triamcinolone  cream (KENALOG ) 0.1 % Apply topically 2 (two) times daily. 02/16/24  Yes [provider]  albuterol  (VENTOLIN  HFA) 108 (90 Base) MCG/ACT inhaler Inhale 2 puffs into the lungs every 6 (six) hours as needed for wheezing or shortness of breath (Cough). 09/24/22   Joesph Shaver Scales, PA-C  allopurinol  (ZYLOPRIM ) 100 MG tablet TAKE 1 TABLET BY MOUTH EVERY DAY 03/15/24   [name], DO  amoxicillin -clavulanate (AUGMENTIN ) 875-125 MG tablet Take 1 tablet by mouth every 12 (twelve) hours. 04/19/24   Armbruster, Elspeth SQUIBB, MD  azelastine  (ASTELIN ) 0.1 % nasal spray Place 2 sprays into both nostrils 2 (two) times daily. Use in each nostril as directed 08/07/23   Patel, Kunjan B, MD  betamethasone dipropionate 0.05 % cream Apply 1 Application topically daily. 04/07/23   [provider]  dicyclomine  (BENTYL ) 10 MG capsule Take 1 capsule (10 mg total) by mouth every 8 (eight) hours as needed for spasms. 04/19/24   [name], MD  diltiazem  (CARDIZEM  CD) 240 MG 24 hr capsule TAKE 1 CAPSULE BY MOUTH EVERY DAY 03/15/24   Frann, Mabel Mt, DO  fexofenadine (ALLEGRA) 180 MG tablet Take 180 mg by mouth daily.    [provider]  Levothyroxine  Sodium (TIROSINT ) 150 MCG CAPS Take 150 mcg by mouth daily before breakfast.    [provider]  liothyronine  (CYTOMEL ) 5 MCG tablet Take 5 mcg by mouth daily before breakfast.  11/13/13   [provider]  mometasone  (NASONEX ) 50 MCG/ACT nasal spray PLACE 2 SPRAYS INTO THE NOSE DAILY. 02/27/24   [name], DO  ondansetron  (ZOFRAN -ODT) 4 MG  disintegrating tablet Take 1 tablet (4 mg total) by mouth every 8 (eight) hours as needed. 04/12/24   Dean Clarity, MD  potassium chloride  (KLOR-CON ) 10 MEQ tablet TAKE 1 TABLET BY MOUTH EVERY DAY 03/15/24   Wendling, Mabel Mt, DO  triamterene -hydrochlorothiazide  (MAXZIDE -25) 37.5-25 MG tablet Take 1 tablet by mouth daily. 06/17/23   [name], DO  prochlorperazine  (COMPAZINE ) 10 MG tablet Take 1 tablet (10 mg total) by mouth every 6 (six) hours as needed (Nausea or vomiting). 11/13/18 05/11/19  Magrinat, Sandria BROCKS, MD    Current Facility-Administered Medications  Medication Dose Route Frequency Provider Last Rate Last Admin   0.9 %  sodium chloride  infusion   Intravenous Continuous Amponsah, Prosper M, MD   Stopped at 04/22/24 0103   acetaminophen  (TYLENOL ) tablet 650 mg  650 mg Oral Q6H PRN Amponsah, Prosper M, MD       Or   acetaminophen  (TYLENOL )  suppository 650 mg  650 mg Rectal Q6H PRN Lou Claretta HERO, MD       allopurinol  (ZYLOPRIM ) tablet 100 mg  100 mg Oral Daily Lou Claretta HERO, MD       azelastine  (ASTELIN ) 0.1 % nasal spray 2 spray  2 spray Each Nare BID Lou Claretta HERO, MD       dicyclomine  (BENTYL ) capsule 10 mg  10 mg Oral Q8H PRN Amponsah, Prosper M, MD   10 mg at 04/22/24 0251   diltiazem  (CARDIZEM  CD) 24 hr capsule 240 mg  240 mg Oral Daily Amponsah, Prosper M, MD       enoxaparin  (LOVENOX ) injection 40 mg  40 mg Subcutaneous Q24H Amponsah, Prosper M, MD       HYDROmorphone  (DILAUDID ) injection 0.5 mg  0.5 mg Intravenous Q6H PRN Lou Claretta HERO, MD       levothyroxine  (SYNTHROID ) tablet 150 mcg  150 mcg Oral Q0600 Lou Claretta HERO, MD   150 mcg at 04/22/24 0516   lidocaine  (LIDODERM ) 5 % 1 patch  1 patch Transdermal Q24H Beverley Doffing A, PA-C   1 patch at 04/21/24 2036   liothyronine  (CYTOMEL ) tablet 5 mcg  5 mcg Oral QAC breakfast Lou Claretta HERO, MD       ondansetron  (ZOFRAN ) tablet 4 mg  4 mg Oral Q6H PRN Lou Claretta HERO, MD        Or   ondansetron  (ZOFRAN ) injection 4 mg  4 mg Intravenous Q6H PRN Amponsah, Prosper M, MD   4 mg at 04/22/24 9748   piperacillin -tazobactam (ZOSYN ) IVPB 3.375 g  3.375 g Intravenous Q8H Lou Claretta HERO, MD 12.5 mL/hr at 04/22/24 0255 3.375 g at 04/22/24 0255   [START ON 04/23/2024] potassium chloride  (KLOR-CON ) CR tablet 10 mEq  10 mEq Oral Daily Hongalgi, Anand D, MD       potassium chloride  (KLOR-CON ) packet 40 mEq  40 mEq Oral Q4H Hongalgi, Trenda BIRCH, MD       senna-docusate (Senokot-S) tablet 1 tablet  1 tablet Oral QHS PRN Lou Claretta HERO, MD       triamterene -hydrochlorothiazide  (MAXZIDE -25) 37.5-25 MG per tablet 1 tablet  1 tablet Oral Daily Lou Claretta HERO, MD        Allergies as of 04/21/2024 - Review Complete 04/21/2024  Allergen Reaction Noted   Chlorhexidine  Itching 02/03/2015   Ciprofloxacin  Anxiety, Other (See Comments), Rash, and Swelling 05/02/2014   Prednisone  Shortness Of Breath and Other (See Comments) 02/16/2014   Codeine Nausea Only 02/16/2014   Cortisone Swelling and Other (See Comments) 12/04/2007   Colchicine  Nausea Only 11/26/2021    Family History  Problem Relation Age of Onset   Endometrial cancer Mother 78   Hearing loss Mother    Atrial fibrillation Mother    Lung cancer Father    Brain cancer Father    Barrett's esophagus Sister    Heart disease Maternal Aunt    Atrial fibrillation Maternal Aunt    Lung cancer Maternal Uncle    Atrial fibrillation Maternal Uncle    Bladder Cancer Maternal Uncle    Multiple myeloma Maternal Uncle    Stroke Maternal Grandmother    Stroke Maternal Grandfather    Other Son        trisomy 13   Colon polyps Cousin    Colon cancer Neg Hx    Esophageal cancer Neg Hx    Rectal cancer Neg Hx    Stomach cancer Neg Hx     Social History  Socioeconomic History   Marital status: Married    Spouse name: Not on file   Number of children: 3   Years of education: Not on file   Highest education level: Some  college, no degree  Occupational History   Not on file  Tobacco Use   Smoking status: Former    Current packs/day: 0.50    Average packs/day: 0.5 packs/day for 15.0 years (7.5 ttl pk-yrs)    Types: Cigarettes   Smokeless tobacco: Never   Tobacco comments:    Quit smoking cigarettes in 2002  Vaping Use   Vaping status: Never Used  Substance and Sexual Activity   Alcohol use: Not Currently   Drug use: No   Sexual activity: Not on file  Other Topics Concern   Not on file  Social History Narrative   Not on file   Social Drivers of Health   Financial Resource Strain: Low Risk  (04/16/2024)   Overall Financial Resource Strain (CARDIA)    Difficulty of Paying Living Expenses: Not hard at all  Food Insecurity: No Food Insecurity (04/21/2024)   Hunger Vital Sign    Worried About Running Out of Food in the Last Year: Never true    Ran Out of Food in the Last Year: Never true  Transportation Needs: No Transportation Needs (04/21/2024)   PRAPARE - Administrator, Civil Service (Medical): No    Lack of Transportation (Non-Medical): No  Physical Activity: Inactive (04/16/2024)   Exercise Vital Sign    Days of Exercise per Week: 0 days    Minutes of Exercise per Session: Not on file  Stress: No Stress Concern Present (04/16/2024)   [employer] of Occupational Health - Occupational Stress Questionnaire    Feeling of Stress: Only a little  Social Connections: Moderately Integrated (04/21/2024)   Social Connection and Isolation Panel    Frequency of Communication with Friends and Family: Once a week    Frequency of Social Gatherings with Friends and Family: Once a week    Attends Religious Services: More than 4 times per year    Active Member of [employer] or Organizations: Yes    Attends Banker Meetings: 1 to 4 times per year    Marital Status: Married  Catering manager Violence: Not At Risk (04/21/2024)   Humiliation, Afraid, Rape, and Kick questionnaire    Fear  of Current or Ex-Partner: No    Emotionally Abused: No    Physically Abused: No    Sexually Abused: No    Review of Systems: Gen: Denies fever, sweats or chills. No weight loss.  CV: Denies chest pain, palpitations or edema. Resp: Denies cough, shortness of breath of hemoptysis.  GI: See HPI.  GU: Denies urinary burning, blood in urine, increased urinary frequency or incontinence. MS: Denies joint pain, muscles aches or weakness. Derm: Denies rash, itchiness, skin lesions or unhealing ulcers. Psych: Denies depression, anxiety, memory loss or confusion. Heme: Denies easy bruising, bleeding. Neuro:  Denies headaches, dizziness or paresthesias. Endo:  Denies any problems with DM, thyroid  or adrenal function.  Physical Exam: Vital signs in last 24 hours: Temp:  [97.5 F (36.4 C)-98.5 F (36.9 C)] 97.5 F (36.4 C) (07/24 0536) Pulse Rate:  [50-77] 50 (07/24 0536) Resp:  [17-18] 17 (07/24 0536) BP: (107-146)/(56-69) 109/56 (07/24 0536) SpO2:  [97 %-100 %] 98 % (07/24 0536) Weight:  [72.1 kg] 72.1 kg (07/23 1550) Last BM Date : 04/21/24 General: Alert 64 year old female in no acute distress.  Head:  Normocephalic and atraumatic. Eyes:  No scleral icterus. Conjunctiva pink. Ears:  Normal auditory acuity. Nose:  No deformity, discharge or lesions. Mouth:  Dentition intact. No ulcers or lesions.  Neck:  Supple. No lymphadenopathy or thyromegaly.  Lungs: Breath sounds clear throughout. No wheezes, rhonchi or crackles.  Heart: Regular rate and rhythm, no murmurs. Abdomen: Soft, nondistended.  Mild tenderness throughout the lower abdomen without rebound or guarding.  Positive bowel sounds to all 4 quadrants. Rectal: Deferred. Musculoskeletal:  Symmetrical without gross deformities.  Pulses:  Normal pulses noted. Extremities:  Without clubbing or edema. Neurologic:  Alert and  oriented x 4. No focal deficits.  Skin:  Intact without significant lesions or rashes. Psych:  Alert and  cooperative. Normal mood and affect.  Intake/Output from previous day: 07/23 0701 - 07/24 0700 In: 2277 [P.O.:60; I.V.:0; IV Piggyback:2217] Out: -  Intake/Output this shift: No intake/output data recorded.  Lab Results: Recent Labs    04/21/24 1748 04/22/24 0457  WBC 12.4* 7.7  HGB 13.8 12.0  HCT 40.5 37.0  PLT 321 243   BMET Recent Labs    04/21/24 1748 04/22/24 0457  NA 138 135  K 3.5 3.1*  CL 101 104  CO2 23 21*  GLUCOSE 98 105*  BUN 9 7*  CREATININE 0.78 0.67  CALCIUM  10.3 9.0   LFT Recent Labs    04/21/24 1748  PROT 8.2*  ALBUMIN 4.9  AST 22  ALT 22  ALKPHOS 105  BILITOT 0.4   PT/INR No results for input(s): LABPROT, INR in the last 72 hours. Hepatitis Panel No results for input(s): HEPBSAG, HCVAB, HEPAIGM, HEPBIGM in the last 72 hours.    Studies/Results: CT ABDOMEN PELVIS W CONTRAST Result Date: 04/21/2024 CLINICAL DATA:  Left lower quadrant pain. EXAM: CT ABDOMEN AND PELVIS WITH CONTRAST TECHNIQUE: Multidetector CT imaging of the abdomen and pelvis was performed using the standard protocol following bolus administration of intravenous contrast. RADIATION DOSE REDUCTION: This exam was performed according to the departmental dose-optimization program which includes automated exposure control, adjustment of the mA and/or kV according to patient size and/or use of iterative reconstruction technique. CONTRAST:  OMNIPAQUE  IOHEXOL  300 MG/ML  SOLN COMPARISON:  April 12, 2024 FINDINGS: Lower chest: No acute abnormality. Bilateral breast implants are noted. Hepatobiliary: There is diffuse fatty infiltration of the liver parenchyma. No focal liver abnormality is seen. Status post cholecystectomy. No biliary dilatation. Pancreas: Unremarkable. No pancreatic ductal dilatation or surrounding inflammatory changes. Spleen: Normal in size without focal abnormality. Adrenals/Urinary Tract: Adrenal glands are unremarkable. Kidneys are normal in size,  without renal calculi or hydronephrosis. A subcentimeter simple cyst is seen within the left kidney. Bladder is unremarkable. Stomach/Bowel: Stomach is within normal limits. Appendix appears normal. No evidence of bowel wall thickening, distention, or inflammatory changes. A mildly inflamed diverticulum is seen within the distal descending colon. Vascular/Lymphatic: Aortic atherosclerosis. No enlarged abdominal or pelvic lymph nodes. Reproductive: Uterus and bilateral adnexa are unremarkable. Other: No abdominal wall hernia or abnormality. No abdominopelvic ascites. Musculoskeletal: No acute or significant osseous findings. IMPRESSION: 1. Mildly inflamed diverticulum within the distal descending colon, consistent with mild diverticulitis. 2. Hepatic steatosis. 3. Evidence of prior cholecystectomy. 4. Aortic atherosclerosis. Electronically Signed   By: Suzen Dials M.D.   On: 04/21/2024 19:20    IMPRESSION/PLAN:  63 year old female with a history of recurrent diverticulitis including diverticulitis with microperforation 03/2023 admitted with recurrent diverticulitis, smoldering diverticulitis. Recently seen in the ED 7/14  with LLQ pain, CTAP showed  left-sided diverticulitis treated with Augmentin  x 7 days with resolution of LLQ pain. Readmitted 7/23 with recurrent LLQ pain. CTAP with contrast showed mildly inflamed diverticulum to the distal descending colon consistent with mild diverticulitis without abscess or perforation. WBC 12.4. On Zosyn  IV.  Lower abdominal pain improving.  Patient endorsed having 1-2 loose stools daily when on Augmentin .  Hemodynamically stable. - Advance to soft diet - Continue IV Zosyn , changed to oral antibiotics at time of discharge (Augmentin  vs Bactrim  DS bid + Flagyl  500mg  q8h) - Patient to follow-up with Dr. Lonni Pizza as an outpatient to consider elective future sigmoid resection - Pain management per the hospitalist - Check C. difficile if patient has  persistent or worsening loose stools - Recommendations per Dr. Stacia  GERD asymptomatic.  Not on PPI at home.  History of lymphocytic colitis per colonoscopy 2021, no evidence of lymphocytic colitis/IBD per colonoscopy 2024  History of breast cancer    Alexandria Bradley  04/22/2024, 10:08 AM

## 2024-04-22 NOTE — Progress Notes (Signed)
 Mobility Specialist - Progress Note   04/22/24 0959  Mobility  Activity Ambulated with assistance in hallway  Level of Assistance Standby assist, set-up cues, supervision of patient - no hands on  Assistive Device Other (Comment) (IV Pole)  Distance Ambulated (ft) 275 ft  Activity Response Tolerated well  Mobility Referral Yes  Mobility visit 1 Mobility  Mobility Specialist Start Time (ACUTE ONLY) [id number]  Mobility Specialist Stop Time (ACUTE ONLY) 0957  Mobility Specialist Time Calculation (min) (ACUTE ONLY) 15 min   Pt received in bed and agreeable to mobility. No complaints during session. Pt to bed after session with all needs met.    [facility]

## 2024-04-22 NOTE — Progress Notes (Signed)
 PROGRESS NOTE   Alexandria Bradley  FMW:[id number]    DOB: May 17, 1961    DOA: 04/21/2024  PCP: [name], DO   I have briefly reviewed patients previous medical records in [facility].   Brief Hospital Course:  63 year old female with medical history significant for recurrent diverticulitis, fatty liver, GERD, gout, HLD, HTN, hypothyroid, breast cancer s/p chemoradiation, prediabetes, OSA on no CPAP, presented to the ED on 7/23 with complaints of recurrent abdominal pain and recurrent acute diverticulitis.  She was seen in the ED on 7/14, diagnosed with acute diverticulitis, started on Augmentin  and 4 to 5 days after starting treatment her abdominal pain resolved and she was feeling good except for antibiotic related diarrhea, she completed Augmentin  on 7/21 and saw Dr. Leigh[facility] on the same day with some discussion about eventual consideration for surgery given recurrent episodes of diverticulitis.  She then presented with recurrent abdominal pain   Assessment & Plan:   Recurrent acute diverticulitis Uncomplicated.  Hemodynamically stable.  CMP and CBC only significant for transient leukocytosis which has resolved and some hypokalemia. CT abdomen 7/23 showed mildly inflamed diverticulum within the distal descending colon consistent with mild diverticulitis.  Not sure if this is a new finding or persistent findings from CT on 7/14. Admitted with bowel rest/n.p.o., IV fluids, IV Zosyn . [facility] consulted.  Discussed with GI team.  Clinically appears to have improved.  Started soft diet.  Await GI MD input.  If tolerates diet and does well later today, could consider discharging home with close outpatient follow-up with GI and outpatient surgical consultation with Dr. Lonni Pizza who she has seen before.?  Discharge on another course of Augmentin . Patient states that when she saw general surgery in the fall of last year, she was getting ready for surgery but  talked herself out of surgery.  Hypokalemia Replace.  Check and replace magnesium  as needed.  If remains in hospital, follow BMP tomorrow but if discharged this can be followed as outpatient  Essential hypertension Controlled.  Continue Cardizem  CD 240 mg daily and Hyzaar  Hypothyroidism/history of Hashimoto's thyroiditis Continue levothyroxine  and levothyroxine   Gout No acute flare Continue allopurinol   History of PVCs Replacing electrolytes as above Continue Cardizem  CD 240 mg daily.  Body mass index is 26.46 kg/m.   DVT prophylaxis: enoxaparin  (LOVENOX ) injection 40 mg Start: 04/22/24 1000     Code Status: Full Code:  Family Communication: None at bedside Disposition:  Status is: Observation The patient remains OBS appropriate and will d/c before 2 midnights.     Consultants:     Procedures:     Subjective:  Seen this morning.  Patient's female RN at bedside.  Patient states that she feels better.  Only some soreness in left lower quadrant but not painful as yesterday.  No vomiting since ED.  Has had diarrhea since she has been on Augmentin  but last BM was yesterday.  Objective:   Vitals:   04/21/24 2211 04/22/24 0208 04/22/24 0536 04/22/24 0916  BP: (!) 145/67 (!) 107/59 (!) 109/56 119/76  Pulse: 68 (!) 54 (!) 50 (!) 50  Resp: 17 17 17 18   Temp: 97.7 F (36.5 C) 98.5 F (36.9 C) (!) 97.5 F (36.4 C) 97.8 F (36.6 C)  TempSrc:    Oral  SpO2: 98% 97% 98% 96%  Weight:      Height:        General exam: Middle-age female, moderately built and nourished sitting up comfortably in bed  without distress.  Does not appear septic or toxic. Respiratory system: Clear to auscultation. Respiratory effort normal. Cardiovascular system: S1 & S2 heard, RRR. No JVD, murmurs, rubs, gallops or clicks. No pedal edema.  Not on telemetry. Gastrointestinal system: Abdomen is nondistended, soft and minimal LQ tenderness without peritoneal signs. No organomegaly or masses  felt. Normal bowel sounds heard. Central nervous system: Alert and oriented. No focal neurological deficits. Extremities: Symmetric 5 x 5 power. Skin: No rashes, lesions or ulcers Psychiatry: Judgement and insight appear normal. Mood & affect appropriate.     Data Reviewed:   I have personally reviewed following labs and imaging studies   CBC: Recent Labs  Lab 04/21/24 1748 04/22/24 0457  WBC 12.4* 7.7  NEUTROABS 9.0*  --   HGB 13.8 12.0  HCT 40.5 37.0  MCV 88.2 92.3  PLT 321 243    Basic Metabolic Panel: Recent Labs  Lab 04/21/24 1748 04/22/24 0457  NA 138 135  K 3.5 3.1*  CL 101 104  CO2 23 21*  GLUCOSE 98 105*  BUN 9 7*  CREATININE 0.78 0.67  CALCIUM  10.3 9.0  MG  --  2.0    Liver Function Tests: Recent Labs  Lab 04/21/24 1748  AST 22  ALT 22  ALKPHOS 105  BILITOT 0.4  PROT 8.2*  ALBUMIN 4.9    CBG: No results for input(s): GLUCAP in the last 168 hours.  Microbiology Studies:  No results found for this or any previous visit (from the past 240 hours).  Radiology Studies:  CT ABDOMEN PELVIS W CONTRAST Result Date: 04/21/2024 CLINICAL DATA:  Left lower quadrant pain. EXAM: CT ABDOMEN AND PELVIS WITH CONTRAST TECHNIQUE: Multidetector CT imaging of the abdomen and pelvis was performed using the standard protocol following bolus administration of intravenous contrast. RADIATION DOSE REDUCTION: This exam was performed according to the departmental dose-optimization program which includes automated exposure control, adjustment of the mA and/or kV according to patient size and/or use of iterative reconstruction technique. CONTRAST:  OMNIPAQUE  IOHEXOL  300 MG/ML  SOLN COMPARISON:  April 12, 2024 FINDINGS: Lower chest: No acute abnormality. Bilateral breast implants are noted. Hepatobiliary: There is diffuse fatty infiltration of the liver parenchyma. No focal liver abnormality is seen. Status post cholecystectomy. No biliary dilatation. Pancreas:  Unremarkable. No pancreatic ductal dilatation or surrounding inflammatory changes. Spleen: Normal in size without focal abnormality. Adrenals/Urinary Tract: Adrenal glands are unremarkable. Kidneys are normal in size, without renal calculi or hydronephrosis. A subcentimeter simple cyst is seen within the left kidney. Bladder is unremarkable. Stomach/Bowel: Stomach is within normal limits. Appendix appears normal. No evidence of bowel wall thickening, distention, or inflammatory changes. A mildly inflamed diverticulum is seen within the distal descending colon. Vascular/Lymphatic: Aortic atherosclerosis. No enlarged abdominal or pelvic lymph nodes. Reproductive: Uterus and bilateral adnexa are unremarkable. Other: No abdominal wall hernia or abnormality. No abdominopelvic ascites. Musculoskeletal: No acute or significant osseous findings. IMPRESSION: 1. Mildly inflamed diverticulum within the distal descending colon, consistent with mild diverticulitis. 2. Hepatic steatosis. 3. Evidence of prior cholecystectomy. 4. Aortic atherosclerosis. Electronically Signed   By: Suzen Dials M.D.   On: 04/21/2024 19:20    Scheduled Meds:    allopurinol   100 mg Oral Daily   azelastine   2 spray Each Nare BID   diltiazem   240 mg Oral Daily   enoxaparin  (LOVENOX ) injection  40 mg Subcutaneous Q24H   levothyroxine   150 mcg Oral Q0600   lidocaine   1 patch Transdermal Q24H   liothyronine   5 mcg Oral QAC breakfast   [START ON 04/23/2024] potassium chloride   10 mEq Oral Daily   potassium chloride   40 mEq Oral Q4H   triamterene -hydrochlorothiazide   1 tablet Oral Daily    Continuous Infusions:    sodium chloride  Stopped (04/22/24 0103)   piperacillin -tazobactam (ZOSYN )  IV 3.375 g (04/22/24 0255)     LOS: 0 days     Trenda Mar, MD,  FACP, [facility], [facility], [facility]   [facility] [place]      To contact the attending provider between 7A-7P or the covering provider  during after hours 7P-7A, please log into the web site [email or web address] and access using universal [place] password for that web site. If you do not have the password, please call the hospital operator.  04/22/2024, 10:25 AM

## 2024-04-22 NOTE — Progress Notes (Signed)
 Pt discharged by Amber,RN. Instructions provided and at bedside. All belongings with patient at bedside. All questions asked and answered. Pt ready for dc.  IV removed. Pt discharged off unit in wheelchair with 3E staff.

## 2024-04-22 NOTE — Progress Notes (Signed)
 Discharge instructions were reviewed with the patient. She denied questions or concerns at this time.

## 2024-04-22 NOTE — TOC Initial Note (Addendum)
 Transition of Care [facility]) - Initial/Assessment Note    Patient Details  Name: Alexandria Bradley MRN: [id number] Date of Birth: July 08, 1961  Transition of Care [facility]) CM/SW Contact:    Alfonse JONELLE Rex, RN Phone Number: 04/22/2024, 2:10 PM  Clinical Narrative:   Met with patient at bedside to introduce role of TOC/NCM and review for dc planning, patient confirmed she has an established PCP, no current home care services or home DME, resides with spouse and feels safe returning home.  MOON completed. TOC will continue to follow.                 Expected Discharge Plan: Home/Self Care Barriers to Discharge: Continued Medical Work up   Patient Goals and CMS Choice Patient states their goals for this hospitalization and ongoing recovery are:: return home          Expected Discharge Plan and Services       Living arrangements for the past 2 months: Single Family Home                                      Prior Living Arrangements/Services Living arrangements for the past 2 months: Single Family Home Lives with:: Spouse Patient language and need for interpreter reviewed:: Yes Do you feel safe going back to the place where you live?: Yes      Need for Family Participation in Patient Care: Yes (Comment) Care giver support system in place?: Yes (comment)   Criminal Activity/Legal Involvement Pertinent to Current Situation/Hospitalization: No - Comment as needed  Activities of Daily Living   ADL Screening (condition at time of admission) Independently performs ADLs?: Yes (appropriate for developmental age) Is the patient deaf or have difficulty hearing?: No Does the patient have difficulty seeing, even when wearing glasses/contacts?: No Does the patient have difficulty concentrating, remembering, or making decisions?: No  Permission Sought/Granted                  Emotional Assessment Appearance:: Appears stated age Attitude/Demeanor/Rapport: Engaged Affect  (typically observed): Accepting Orientation: : Oriented to Self, Oriented to Place, Oriented to  Time, Oriented to Situation Alcohol / Substance Use: Not Applicable Psych Involvement: No (comment)  Admission diagnosis:  Diverticulitis [K57.92] Generalized abdominal pain [R10.84] Acute diverticulitis [K57.92] Patient Active Problem List   Diagnosis Date Noted   Acute diverticulitis 04/21/2024   Perforation of sigmoid colon due to diverticulitis 04/23/2023   Hypokalemia 04/23/2023   Hyperproteinemia 04/23/2023   Hyponatremia 04/23/2023   Metabolic acidosis, increased anion gap 04/23/2023   Sepsis (HCC) 01/09/2023   Diverticulitis 01/08/2023   Chronic gout involving toe of left foot without tophus 05/11/2021   Acquired absence of breast and nipple, bilateral 04/20/2019   Recurrent cancer of left breast (HCC) 11/13/2018   Neuropathy due to chemotherapeutic drug (HCC) 11/13/2018   Prediabetes 05/28/2018   OSA (obstructive sleep apnea) 08/20/2016   Adjustment disorder with mixed anxiety and depressed mood 08/20/2016   Genetic testing 07/07/2015   Family history of uterine cancer    Family history of bladder cancer    Fatty infiltration of liver 07/08/2014   Chemotherapy-induced nausea 06/10/2014   Malignant neoplasm of upper-inner quadrant of left breast in female, estrogen receptor negative (HCC) 02/11/2014   HLD (hyperlipidemia) 05/11/2010   PSORIASIS 05/11/2010   Nonspecific (abnormal) findings on radiological and other examination of body structure 05/11/2010   CHEST XRAY, ABNORMAL 05/11/2010  Hypothyroidism 05/10/2010   Essential hypertension 09/02/2007   Allergic rhinitis, cause unspecified 09/02/2007   PCP:  [name], DO Pharmacy:   [facility] [phone] - [place], [place] - [address] [place] [address] Phone: [phone] Fax: [phone]     Social Drivers of Health (SDOH) Social History: SDOH Screenings   Food  Insecurity: No Food Insecurity (04/21/2024)  Housing: Low Risk  (04/21/2024)  Transportation Needs: No Transportation Needs (04/21/2024)  Utilities: Not At Risk (04/21/2024)  Alcohol Screen: Low Risk  (10/14/2023)  Depression (PHQ2-9): Low Risk  (10/14/2023)  Financial Resource Strain: Low Risk  (04/16/2024)  Physical Activity: Inactive (04/16/2024)  Social Connections: Moderately Integrated (04/21/2024)  Stress: No Stress Concern Present (04/16/2024)  Tobacco Use: Medium Risk (04/21/2024)  Health Literacy: Adequate Health Literacy (10/14/2023)   SDOH Interventions:     Readmission Risk Interventions    04/24/2023    1:37 PM  Readmission Risk Prevention Plan  Post Dischage Appt Complete  Medication Screening Complete  Transportation Screening Complete

## 2024-04-22 NOTE — Discharge Summary (Signed)
 Physician Discharge Summary  Alexandria Bradley:[id number] DOB: 22-Jul-1961  PCP: Alexandria Bradley  Admitted from: Home Discharged to: Home  Admit date: 04/21/2024 Discharge date: 04/22/2024  Recommendations for Outpatient Follow-up:    Follow-up Information     Alexandria Bradley. Schedule an appointment as soon as possible for a visit in 1 week(s).   Specialty: Family Medicine Why: To be seen with repeat labs (CBC, BMP & magnesium ). Contact information: [address] [place] [address] [phone]         Alexandria Bradley. Schedule an appointment as soon as possible for a visit.   Specialties: General Surgery, Colon and Rectal Surgery Contact information: [address] [place] [address] [phone]         Alexandria Bradley. Schedule an appointment as soon as possible for a visit.   Specialty: Gastroenterology Contact information: [address] [place] [address] [phone]                  Home Health: None    Equipment/Devices: None    Discharge Condition: Improved and stable.   Code Status: Full Code Diet recommendation:  Discharge Diet Orders (From admission, onward)     Start     Ordered   04/22/24 0000  Diet - low sodium heart healthy        04/22/24 1528             Discharge Diagnoses:  Principal Problem:   Acute diverticulitis   Brief Hospital Course:  63 year old female with medical history significant for recurrent diverticulitis, fatty liver, GERD, gout, HLD, HTN, hypothyroid, breast cancer s/p chemoradiation, prediabetes, OSA on no CPAP, presented to the ED on 7/23 with complaints of recurrent abdominal pain and recurrent acute diverticulitis.  She was seen in the ED on 7/14, diagnosed with acute diverticulitis, started on Augmentin  and 4 to 5 days after starting treatment her abdominal pain resolved and she was feeling good  except for antibiotic related diarrhea, she completed Augmentin  on 7/21 and saw Dr. Leigh, [facility] on the same day with some discussion about eventual consideration for surgery given recurrent episodes of diverticulitis.  She then presented with recurrent abdominal pain     Assessment & Plan:    Recurrent acute diverticulitis Uncomplicated.  Hemodynamically stable.  CMP and CBC only significant for transient leukocytosis which has resolved and some hypokalemia. CT abdomen 7/23 showed mildly inflamed diverticulum within the distal descending colon consistent with mild diverticulitis.  Not sure if this is a new finding or persistent findings from CT on 7/14. Admitted with bowel rest/n.p.o., IV fluids, IV Zosyn . [facility] consulted.  Discussed with GI team.  Clinically appears to have improved.  Started soft diet.  Await GI Bradley input.  If tolerates diet and does well later today, could consider discharging home with close outpatient follow-up with GI and outpatient surgical consultation with Dr. Lonni Alexandria who she has seen before.?  Discharge on another course of Augmentin . Patient states that when she saw general surgery in the fall of last year, she was getting ready for surgery but talked herself out of surgery.  Addendum Since this morning's review, patient was started on soft diet which she has tolerated, not much abdominal pain and feels okay to go home Reviewed with Dr. Stacia, [facility] who has seen her today, has cleared her for DC on Augmentin  x 1 week, she  has some diarrhea related to the Augmentin  but it is not severe.  Patient was advised to use OTC probiotics which she is quite familiar with.   Hypokalemia Replaced PTA.  Close outpatient follow-up with repeat labs.   Essential hypertension Controlled.  Continue Cardizem  CD 240 mg daily and Hyzaar   Hypothyroidism/history of Hashimoto's thyroiditis Continue levothyroxine  and levothyroxine    Gout No acute  flare Continue allopurinol    History of PVCs Replacing electrolytes as above Continue Cardizem  CD 240 mg daily.   Body mass index is 26.46 kg/m.      Consultants:       Procedures:       Discharge Instructions  Discharge Instructions     Call Bradley for:  difficulty breathing, headache or visual disturbances   Complete by: As directed    Call Bradley for:  extreme fatigue   Complete by: As directed    Call Bradley for:  persistant dizziness or light-headedness   Complete by: As directed    Call Bradley for:  persistant nausea and vomiting   Complete by: As directed    Call Bradley for:  severe uncontrolled pain   Complete by: As directed    Call Bradley for:  temperature >100.4   Complete by: As directed    Diet - low sodium heart healthy   Complete by: As directed    Increase activity slowly   Complete by: As directed         Medication List     TAKE these medications    acetaminophen  500 MG tablet Commonly known as: TYLENOL  Take 1,000 mg by mouth every 6 (six) hours as needed for mild pain (pain score 1-3) or moderate pain (pain score 4-6).   albuterol  108 (90 Base) MCG/ACT inhaler Commonly known as: VENTOLIN  HFA Inhale 2 puffs into the lungs every 6 (six) hours as needed for wheezing or shortness of breath (Cough).   allopurinol  100 MG tablet Commonly known as: ZYLOPRIM  TAKE 1 TABLET BY MOUTH EVERY DAY What changed: when to take this   amoxicillin -clavulanate 875-125 MG tablet Commonly known as: AUGMENTIN  Take 1 tablet by mouth 2 (two) times daily for 7 days. What changed: when to take this   azelastine  0.1 % nasal spray Commonly known as: ASTELIN  Place 1-2 sprays into both nostrils daily as needed for rhinitis or allergies.   dicyclomine  10 MG capsule Commonly known as: BENTYL  Take 1 capsule (10 mg total) by mouth every 8 (eight) hours as needed for spasms. What changed: when to take this   diltiazem  240 MG 24 hr capsule Commonly known as: CARDIZEM  CD TAKE 1  CAPSULE BY MOUTH EVERY DAY   fexofenadine 180 MG tablet Commonly known as: ALLEGRA Take 180 mg by mouth daily.   liothyronine  5 MCG tablet Commonly known as: CYTOMEL  Take 5 mcg by mouth daily before breakfast.   mometasone  50 MCG/ACT nasal spray Commonly known as: NASONEX  Place 1-2 sprays into the nose daily as needed (allergies).   ondansetron  4 MG disintegrating tablet Commonly known as: ZOFRAN -ODT Take 1 tablet (4 mg total) by mouth every 8 (eight) hours as needed. What changed:  when to take this reasons to take this   potassium chloride  10 MEQ tablet Commonly known as: KLOR-CON  TAKE 1 TABLET BY MOUTH EVERY DAY   Tirosint  150 MCG Caps Generic drug: Levothyroxine  Sodium Take 150 mcg by mouth daily before breakfast.   triamcinolone  cream 0.1 % Commonly known as: KENALOG  Apply 1 Application topically daily.  triamterene -hydrochlorothiazide  37.5-25 MG tablet Commonly known as: MAXZIDE -25 Take 1 tablet by mouth daily.       Allergies  Allergen Reactions   Chlorhexidine  Itching   Ciprofloxacin  Anxiety, Other (See Comments), Rash and Swelling    GI upset   Prednisone  Shortness Of Breath and Other (See Comments)    Jittery, jacks me up     Codeine Nausea Only   Cortisone Swelling and Other (See Comments)    hyper sensitive, jacks me up    Colchicine  Nausea Only      Procedures/Studies: CT ABDOMEN PELVIS W CONTRAST Result Date: 04/21/2024 CLINICAL DATA:  Left lower quadrant pain. EXAM: CT ABDOMEN AND PELVIS WITH CONTRAST TECHNIQUE: Multidetector CT imaging of the abdomen and pelvis was performed using the standard protocol following bolus administration of intravenous contrast. RADIATION DOSE REDUCTION: This exam was performed according to the departmental dose-optimization program which includes automated exposure control, adjustment of the mA and/or kV according to patient size and/or use of iterative reconstruction technique. CONTRAST:  OMNIPAQUE   IOHEXOL  300 MG/ML  SOLN COMPARISON:  April 12, 2024 FINDINGS: Lower chest: No acute abnormality. Bilateral breast implants are noted. Hepatobiliary: There is diffuse fatty infiltration of the liver parenchyma. No focal liver abnormality is seen. Status post cholecystectomy. No biliary dilatation. Pancreas: Unremarkable. No pancreatic ductal dilatation or surrounding inflammatory changes. Spleen: Normal in size without focal abnormality. Adrenals/Urinary Tract: Adrenal glands are unremarkable. Kidneys are normal in size, without renal calculi or hydronephrosis. A subcentimeter simple cyst is seen within the left kidney. Bladder is unremarkable. Stomach/Bowel: Stomach is within normal limits. Appendix appears normal. No evidence of bowel wall thickening, distention, or inflammatory changes. A mildly inflamed diverticulum is seen within the distal descending colon. Vascular/Lymphatic: Aortic atherosclerosis. No enlarged abdominal or pelvic lymph nodes. Reproductive: Uterus and bilateral adnexa are unremarkable. Other: No abdominal wall hernia or abnormality. No abdominopelvic ascites. Musculoskeletal: No acute or significant osseous findings. IMPRESSION: 1. Mildly inflamed diverticulum within the distal descending colon, consistent with mild diverticulitis. 2. Hepatic steatosis. 3. Evidence of prior cholecystectomy. 4. Aortic atherosclerosis. Electronically Signed   By: Suzen Dials M.D.   On: 04/21/2024 19:20   CT ABDOMEN PELVIS W CONTRAST Result Date: 04/12/2024 CLINICAL DATA:  LLQ abd pain radiating to back x3 days. C/o nausea last night. EXAM: CT ABDOMEN AND PELVIS WITH CONTRAST TECHNIQUE: Multidetector CT imaging of the abdomen and pelvis was performed using the standard protocol following bolus administration of intravenous contrast. RADIATION DOSE REDUCTION: This exam was performed according to the departmental dose-optimization program which includes automated exposure control, adjustment of the mA  and/or kV according to patient size and/or use of iterative reconstruction technique. CONTRAST:  OMNIPAQUE  IOHEXOL  300 MG/ML  SOLN COMPARISON:  CT abdomen pelvis 03/12/2020, CT abdomen pelvis 04/22/2023 FINDINGS: Lower chest: Bilateral breast implants.  No acute abnormality. Hepatobiliary: No focal liver abnormality. Status post cholecystectomy. No biliary dilatation. Pancreas: No focal lesion. Normal pancreatic contour. No surrounding inflammatory changes. No main pancreatic ductal dilatation. Spleen: Normal in size without focal abnormality.  Splenules noted. Adrenals/Urinary Tract: No adrenal nodule bilaterally. Bilateral kidneys enhance symmetrically. No hydronephrosis. No hydroureter. No nephroureterolithiasis bilaterally. Fluid density lesion within the right kidney likely represents a simple renal cyst. Simple renal cysts, in the absence of clinically indicated signs/symptoms, require no independent follow-up. The urinary bladder is unremarkable. Stomach/Bowel: Stomach is within normal limits. No evidence of small bowel wall thickening or dilatation. Mild bowel wall thickening along a focal distal descending/ proximal sigmoid colon diverticula  with trace pericolonic fat stranding. Colonic diverticulosis. The appendix is not definitely identified with no inflammatory changes in the right lower quadrant to suggest acute appendicitis. Vascular/Lymphatic: No abdominal aorta or iliac aneurysm. Mild to moderate atherosclerotic plaque of the aorta and its branches. No abdominal, pelvic, or inguinal lymphadenopathy. Reproductive: Uterus and bilateral adnexa are unremarkable. Other: No intraperitoneal free fluid. No intraperitoneal free gas. No organized fluid collection. Musculoskeletal: No abdominal wall hernia or abnormality. No suspicious lytic or blastic osseous lesions. No acute displaced fracture. IMPRESSION: 1. Colonic diverticulosis with uncomplicated acute diverticulitis of the distal  descending/proximal sigmoid colon. Recommend colonoscopy status post treatment and status post complete resolution of inflammatory changes to exclude an underlying lesion. 2.  Aortic Atherosclerosis (ICD10-I70.0). Electronically Signed   By: Morgane  Naveau M.D.   On: 04/12/2024 14:42      Subjective: Since this morning's review, patient was started on soft diet which she has tolerated, not much abdominal pain and feels okay to go home  Discharge Exam:  Vitals:   04/22/24 0208 04/22/24 0536 04/22/24 0916 04/22/24 1328  BP: (!) 107/59 (!) 109/56 119/76 119/61  Pulse: (!) 54 (!) 50 (!) 50 (!) 51  Resp: 17 17 18 16   Temp: 98.5 F (36.9 C) (!) 97.5 F (36.4 C) 97.8 F (36.6 C) 97.8 F (36.6 C)  TempSrc:   Oral Oral  SpO2: 97% 98% 96% 97%  Weight:      Height:        General exam: Middle-age female, moderately built and nourished sitting up comfortably in bed without distress.  Does not appear septic or toxic. Respiratory system: Clear to auscultation. Respiratory effort normal. Cardiovascular system: S1 & S2 heard, RRR. No JVD, murmurs, rubs, gallops or clicks. No pedal edema.  Not on telemetry. Gastrointestinal system: Abdomen is nondistended, soft and minimal LLQ tenderness without peritoneal signs. No organomegaly or masses felt. Normal bowel sounds heard. Central nervous system: Alert and oriented. No focal neurological deficits. Extremities: Symmetric 5 x 5 power. Skin: No rashes, lesions or ulcers Psychiatry: Judgement and insight appear normal. Mood & affect appropriate.     The results of significant diagnostics from this hospitalization (including imaging, microbiology, ancillary and laboratory) are listed below for reference.     Microbiology: No results found for this or any previous visit (from the past 240 hours).   Labs: CBC: Recent Labs  Lab 04/21/24 1748 04/22/24 0457  WBC 12.4* 7.7  NEUTROABS 9.0*  --   HGB 13.8 12.0  HCT 40.5 37.0  MCV 88.2 92.3   PLT 321 243    Basic Metabolic Panel: Recent Labs  Lab 04/21/24 1748 04/22/24 0457  NA 138 135  K 3.5 3.1*  CL 101 104  CO2 23 21*  GLUCOSE 98 105*  BUN 9 7*  CREATININE 0.78 0.67  CALCIUM  10.3 9.0  MG  --  2.0    Liver Function Tests: Recent Labs  Lab 04/21/24 1748  AST 22  ALT 22  ALKPHOS 105  BILITOT 0.4  PROT 8.2*  ALBUMIN 4.9     Urinalysis    Component Value Date/Time   COLORURINE YELLOW 04/21/2024 1754   APPEARANCEUR CLEAR 04/21/2024 1754   LABSPEC 1.010 04/21/2024 1754   LABSPEC 1.020 06/15/2014 1101   PHURINE 6.0 04/21/2024 1754   GLUCOSEU NEGATIVE 04/21/2024 1754   GLUCOSEU Negative 06/15/2014 1101   HGBUR NEGATIVE 04/21/2024 1754   BILIRUBINUR NEGATIVE 04/21/2024 1754   BILIRUBINUR moderate 04/22/2023 1017   BILIRUBINUR Color Interference 06/15/2014  1101   KETONESUR NEGATIVE 04/21/2024 1754   PROTEINUR NEGATIVE 04/21/2024 1754   UROBILINOGEN 0.2 04/22/2023 1017   UROBILINOGEN 0.2 06/15/2014 1101   NITRITE NEGATIVE 04/21/2024 1754   LEUKOCYTESUR NEGATIVE 04/21/2024 1754   LEUKOCYTESUR Trace 06/15/2014 1101      Time coordinating discharge: 25 minutes  SIGNED:  Trenda Mar, Bradley,  FACP, [facility], [facility], [facility]   [facility] [place]     To contact the attending provider between 7A-7P or the covering provider during after hours 7P-7A, please log into the web site [email or web address] and access using universal [place] password for that web site. If you Bradley not have the password, please call the hospital operator.

## 2024-04-22 NOTE — Discharge Instructions (Signed)

## 2024-05-04 ENCOUNTER — Telehealth: Payer: Self-pay | Admitting: Gastroenterology

## 2024-05-04 NOTE — Telephone Encounter (Signed)
 Dr Leigh-  Please advise...  Patient with history of recurrent left-sided diverticulitis, at least 1 episode associated with sepsis physiology, and another with microperforation. Last hospitalization 04/22/24.  Patient was originally scheduled to have prophylactic surgery by Dr Teresa (prior to last hospitalization) but did not follow through.  She wants to know if she needs follow up with GI.   She is currently scheduled for repeat consultation with Dr Teresa on 05/11/24.   Do we have anything additional to offer at this time?

## 2024-05-04 NOTE — Telephone Encounter (Signed)
 Patient call and stated that she is wanting to know if she needs to follow up with Dr. Leigh since her recent ED back on July the 23 rd. Patient is requesting a call back. Please advise.

## 2024-05-05 NOTE — Telephone Encounter (Signed)
 Patient is advised of Dr Hassan response/recommendations and she verbalizes understanding.

## 2024-05-05 NOTE — Telephone Encounter (Signed)
 Her colonoscopy is UTD. There is no further workup needed from our end or follow up with us  specifically that is needed. She is high risk for recurrent diverticulitis, recommend she keep her appointment for surgical evaluation.

## 2024-06-08 NOTE — Progress Notes (Signed)
 Sent message, via epic in basket, requesting orders in epic from Careers adviser.

## 2024-06-09 ENCOUNTER — Ambulatory Visit: Payer: Self-pay | Admitting: Surgery

## 2024-06-09 DIAGNOSIS — Z01818 Encounter for other preprocedural examination: Secondary | ICD-10-CM

## 2024-06-14 NOTE — Progress Notes (Signed)
 COVID Vaccine received:  []  No [x]  Yes Date of any COVID positive Test in last 90 days:  PCP - Mabel Pry, DO  Endocrinology- Reyes Alexander, MD  [phone]  Cardiologist - Maude Emmer, MD   Chest x-ray - 01-08-2023  1v  Epic EKG - 4- [phone]  Epic Stress Test -  ECHO - 07-28-2018 Cardiac Cath - 07-24-2018 [facility] / [facility]  by Dr. Burnard   Normal Coronaries CT Coronary Calcium  score: 5.49 on 03-25-2022  Epic  Bowel Prep - []  No  [x]   Yes _CCS Prep__  Pacemaker / ICD device [x]  No []  Yes   Spinal Cord Stimulator:[x]  No []  Yes       History of Sleep Apnea? []  No [x]  Yes   CPAP used?- [x]  No []  Yes    Patient has: []  NO Hx DM   [x]  Pre-DM   []  DM1  []   DM2 Does the patient monitor blood sugar?   []  N/A   [x]  No []  Yes  Last A1c was:  5.7  on  04-23-2023  No Meds     Blood Thinner / Instructions: none Aspirin  Instructions:  none  ERAS Protocol Ordered: []  No  [x]  Yes PRE-SURGERY []  ENSURE  [x]  G2 X3   Patient is to be NPO after: 09:00  Dental hx: []  Dentures:  []  N/A      []  Bridge or Partial:                   []  Loose or Damaged teeth:   Comments: No ostomy consult or Markings done at PST per Erminio Console.   Activity level: Able to walk up 2 flights of stairs without becoming significantly short of breath or having chest pain?  []  No   []    Yes  Patient can perform ADLs without assistance. []  No   []   Yes  Anesthesia review: HTN, Pre-DM, Hashimoto's thyroiditis, Fatty Liver, OSA- no CPAP, PONV, Difficult to wake up  Patient denies any S&S of respiratory illness or Covid - no shortness of breath, fever, cough or chest pain at PAT appointment.  Patient verbalized understanding and agreement to the Pre-Surgical Instructions that were given to them at this PAT appointment. Patient was also educated of the need to review these PAT instructions again prior to her surgery.I reviewed the appropriate phone numbers to call if they have any and questions or concerns.

## 2024-06-14 NOTE — Patient Instructions (Signed)
 SURGICAL WAITING ROOM VISITATION Patients having surgery or a procedure may have no more than 2 support people in the waiting area - these visitors may rotate in the visitor waiting room.   If the patient needs to stay at the hospital during part of their recovery, the visitor guidelines for inpatient rooms apply.  PRE-OP VISITATION  Pre-op nurse will coordinate an appropriate time for 1 support person to accompany the patient in pre-op.  This support person may not rotate.  This visitor will be contacted when the time is appropriate for the visitor to come back in the pre-op area.  Please refer to the [facility] website for the visitor guidelines for Inpatients (after your surgery is over and you are in a regular room).  You are not required to quarantine at this time prior to your surgery. However, you must do this: Hand Hygiene often Do NOT share personal items Notify your provider if you are in close contact with someone who has COVID or you develop fever 100.4 or greater, new onset of sneezing, cough, sore throat, shortness of breath or body aches.  If you test positive for Covid or have been in contact with anyone that has tested positive in the last 10 days please notify you surgeon.    Your procedure is scheduled on:  FRIDAY  June 18, 2024  Report to [facility] Main Entrance: [place] entrance where the [employer] is available.   Report to admitting at: 09:45    AM  Call this number if you have any questions or problems the morning of surgery [phone]  FOLLOW ANY ADDITIONAL PRE OP INSTRUCTIONS YOU RECEIVED FROM YOUR SURGEON'S OFFICE!!!  Dulcolax 20 mg (total) - Take 4 (four) of the 5 mg Dulcolax tablets with water at 07:00 am the day prior to surgery.  Miralax  255 g - Mix with 64 oz Gatorade/Powerade.  Starting at 10:00 am ,Drink this gradually over the next few hours (8 oz glass every 15-30 minutes) until gone the day prior to surgery You should finish in 4  hours-6 hours.    Neomycin  1000 mg - At 2 pm, 3 pm and 10 pm after Miralax   bowel prep the day prior to surgery.  Metronidazole  1000 mg - At 2 pm, 3 pm and 10 pm after Miralax  bowel prep the day prior to surgery.   Drink plenty of clear liquids all evening to avoid getting dehydrated.   DRINK two (2) bottles of Pre-Surgery G2 drink starting at 6:00 pm the evening prior to your surgery to help prevent dehydration. Increase drinking clear fluids (see list below)          Do not eat food after Midnight the night prior to your surgery/procedure.  After Midnight you may have the following liquids until   09:00 AM DAY OF SURGERY  Clear Liquid Diet Water Black Coffee (sugar ok, NO MILK/CREAM OR CREAMERS)  Tea (sugar ok, NO MILK/CREAM OR CREAMERS) regular and decaf                             Plain Jell-O  with no fruit (NO RED)                                           Fruit ices (not with fruit pulp, NO RED)  Popsicles (NO RED)                                                                  Juice: NO CITRUS JUICES: only apple, WHITE grape, WHITE cranberry Sports drinks like Gatorade or Powerade (NO RED)                   The day of surgery:  Drink ONE (1) Pre-Surgery G2 at   09:00 AM the morning of surgery. Drink in one sitting. Do not sip.  This drink was given to you during your hospital pre-op appointment visit. Nothing else to drink after completing the Pre-Surgery Clear Ensure or G2 : No candy, chewing gum or throat lozenges.     Oral Hygiene is also important to reduce your risk of infection.        Remember - BRUSH YOUR TEETH THE MORNING OF SURGERY WITH YOUR REGULAR TOOTHPASTE  Do NOT smoke after Midnight the night before surgery.  STOP TAKING all Vitamins, Herbs and supplements 1 week before your surgery.   Take ONLY these medicines the morning of surgery with A SIP OF WATER: levothyroxine , liothyronine , Diltiazem , Fexofenadine, and  Tylenol  if needed for pain. You may use your Nasal Sprays and Albuterol  inhaler if needed. Please bring your Albuterol  inhaler with you on the day of surgery.   DO NOT TAKE Triamterene / HCTZ on the morning of your surgery.   You may not have any metal on your body including hair pins, jewelry, and body piercing  Do not wear make-up, lotions, powders, perfumesor deodorant  Do not wear nail polish including gel and S&S, artificial / acrylic nails, or any other type of covering on natural nails including finger and toenails. If you have artificial nails, gel coating, etc., that needs to be removed by a nail salon, Please have this removed prior to surgery. Not doing so may mean that your surgery could be cancelled or delayed if the Surgeon or anesthesia staff feels like they are unable to monitor you safely.   Do not shave 48 hours prior to surgery to avoid nicks in your skin which may contribute to postoperative infections.   Contacts, Hearing Aids, dentures or bridgework may not be worn into surgery. DENTURES WILL BE REMOVED PRIOR TO SURGERY PLEASE DO NOT APPLY Poly grip OR ADHESIVES!!!  You may bring a small overnight bag with you on the day of surgery, only pack items that are not valuable. [facility] IS NOT RESPONSIBLE   FOR VALUABLES THAT ARE LOST OR STOLEN.   Do not bring your home medications to the hospital. The Pharmacy will dispense medications listed on your medication list to you during your admission in the Hospital.  Please read over the following fact sheets you were given: IF YOU HAVE QUESTIONS ABOUT YOUR PRE-OP INSTRUCTIONS, PLEASE CALL [phone]   SINCE YOU HAVE A CHG (chlorahexidine gluconate) ALLERGY Please follow the following instructions:   [facility] - Preparing for Surgery Before surgery, you can play an important role.  Because skin is not sterile, your skin needs to be as free of germs as possible.  You can reduce the number of germs on your skin by washing  with Antibacterial soap before surgery.  . Do not shave (including  legs and underarms) for at least 48 hours prior to the first shower.  You may shave your face/neck.  Please follow these instructions carefully:  1.  Shower with antibacterial Soap the night before surgery and the  morning of surgery.  2.  If you choose to wash your hair, wash your hair first as usual with your normal  shampoo.  3.  After you shampoo, rinse your hair and body thoroughly to remove the shampoo.                             4.  You can apply soap directly to the skin and wash.  Gently with a scrungie or clean washcloth.  5.  Wash face,  Genitals (private parts) with your normal soap.             6.  Wash thoroughly, paying special attention to the area where your  surgery  will be performed.  7.  Thoroughly rinse your body with warm water from the neck down.  8.   Pat yourself dry with a clean towel.             9  Wear clean pajamas.            10 Place clean sheets on your bed the night of your first shower and do not  sleep with pets.  ON THE DAY OF SURGERY : Do not apply any lotions/deodorants the morning of surgery.  Please wear clean clothes to the hospital/surgery center.  FAILURE TO FOLLOW THESE INSTRUCTIONS MAY RESULT IN THE CANCELLATION OF YOUR SURGERY  PATIENT SIGNATURE_________________________________  NURSE SIGNATURE__________________________________ ________________________________________________________________________        Alexandria Bradley    An incentive spirometer is a tool that can help keep your lungs clear and active. This tool measures how well you are filling your lungs with each breath. Taking long deep breaths may help reverse or decrease the chance of developing breathing (pulmonary) problems (especially infection) following: A long period of time when you are unable to move or be active. BEFORE THE PROCEDURE  If the spirometer includes an indicator to show your best  effort, your nurse or respiratory therapist will set it to a desired goal. If possible, sit up straight or lean slightly forward. Try not to slouch. Hold the incentive spirometer in an upright position. INSTRUCTIONS FOR USE  Sit on the edge of your bed if possible, or sit up as far as you can in bed or on a chair. Hold the incentive spirometer in an upright position. Breathe out normally. Place the mouthpiece in your mouth and seal your lips tightly around it. Breathe in slowly and as deeply as possible, raising the piston or the ball toward the top of the column. Hold your breath for 3-5 seconds or for as long as possible. Allow the piston or ball to fall to the bottom of the column. Remove the mouthpiece from your mouth and breathe out normally. Rest for a few seconds and repeat Steps 1 through 7 at least 10 times every 1-2 hours when you are awake. Take your time and take a few normal breaths between deep breaths. The spirometer may include an indicator to show your best effort. Use the indicator as a goal to work toward during each repetition. After each set of 10 deep breaths, practice coughing to be sure your lungs are clear. If you have an incision (the cut made at the  time of surgery), support your incision when coughing by placing a pillow or rolled up towels firmly against it. Once you are able to get out of bed, walk around indoors and cough well. You may stop using the incentive spirometer when instructed by your caregiver.  RISKS AND COMPLICATIONS Take your time so you do not get dizzy or light-headed. If you are in pain, you may need to take or ask for pain medication before doing incentive spirometry. It is harder to take a deep breath if you are having pain. AFTER USE Rest and breathe slowly and easily. It can be helpful to keep track of a log of your progress. Your caregiver can provide you with a simple table to help with this. If you are using the spirometer at home, follow  these instructions: SEEK MEDICAL CARE IF:  You are having difficultly using the spirometer. You have trouble using the spirometer as often as instructed. Your pain medication is not giving enough relief while using the spirometer. You develop fever of 100.5 F (38.1 C) or higher.                                                                                                    SEEK IMMEDIATE MEDICAL CARE IF:  You cough up bloody sputum that had not been present before. You develop fever of 102 F (38.9 C) or greater. You develop worsening pain at or near the incision site. MAKE SURE YOU:  Understand these instructions. Will watch your condition. Will get help right away if you are not doing well or get worse. Document Released: 01/27/2007 Document Revised: 12/09/2011 Document Reviewed: 03/30/2007 [facility] Patient Information 2014 [place], [place].       WHAT IS A BLOOD TRANSFUSION? Blood Transfusion Information  A transfusion is the replacement of blood or some of its parts. Blood is made up of multiple cells which provide different functions. Red blood cells carry oxygen and are used for blood loss replacement. White blood cells fight against infection. Platelets control bleeding. Plasma helps clot blood. Other blood products are available for specialized needs, such as hemophilia or other clotting disorders. BEFORE THE TRANSFUSION  Who gives blood for transfusions?  Healthy volunteers who are fully evaluated to make sure their blood is safe. This is blood bank blood. Transfusion therapy is the safest it has ever been in the practice of medicine. Before blood is taken from a donor, a complete history is taken to make sure that person has no history of diseases nor engages in risky social behavior (examples are intravenous drug use or sexual activity with multiple partners). The donor's travel history is screened to minimize risk of transmitting infections, such as malaria. The  donated blood is tested for signs of infectious diseases, such as HIV and hepatitis. The blood is then tested to be sure it is compatible with you in order to minimize the chance of a transfusion reaction. If you or a relative donates blood, this is often done in anticipation of surgery and is not appropriate for emergency situations. It takes many days to process  the donated blood. RISKS AND COMPLICATIONS Although transfusion therapy is very safe and saves many lives, the main dangers of transfusion include:  Getting an infectious disease. Developing a transfusion reaction. This is an allergic reaction to something in the blood you were given. Every precaution is taken to prevent this. The decision to have a blood transfusion has been considered carefully by your caregiver before blood is given. Blood is not given unless the benefits outweigh the risks. AFTER THE TRANSFUSION Right after receiving a blood transfusion, you will usually feel much better and more energetic. This is especially true if your red blood cells have gotten low (anemic). The transfusion raises the level of the red blood cells which carry oxygen, and this usually causes an energy increase. The nurse administering the transfusion will monitor you carefully for complications. HOME CARE INSTRUCTIONS  No special instructions are needed after a transfusion. You may find your energy is better. Speak with your caregiver about any limitations on activity for underlying diseases you may have. SEEK MEDICAL CARE IF:  Your condition is not improving after your transfusion. You develop redness or irritation at the intravenous (IV) site. SEEK IMMEDIATE MEDICAL CARE IF:  Any of the following symptoms occur over the next 12 hours: Shaking chills. You have a temperature by mouth above 102 F (38.9 C), not controlled by medicine. Chest, back, or muscle pain. People around you feel you are not acting correctly or are confused. Shortness of  breath or difficulty breathing. Dizziness and fainting. You get a rash or develop hives. You have a decrease in urine output. Your urine turns a dark color or changes to pink, red, or brown. Any of the following symptoms occur over the next 10 days: You have a temperature by mouth above 102 F (38.9 C), not controlled by medicine. Shortness of breath. Weakness after normal activity. The white part of the eye turns yellow (jaundice). You have a decrease in the amount of urine or are urinating less often. Your urine turns a dark color or changes to pink, red, or brown. Document Released: 09/13/2000 Document Revised: 12/09/2011 Document Reviewed: 05/02/2008 [facility] Patient Information 2014 [place], [place].  _______________________________________________________________________

## 2024-06-15 ENCOUNTER — Encounter (HOSPITAL_COMMUNITY): Payer: Self-pay

## 2024-06-15 ENCOUNTER — Other Ambulatory Visit: Payer: Self-pay

## 2024-06-15 ENCOUNTER — Encounter (HOSPITAL_COMMUNITY)
Admission: RE | Admit: 2024-06-15 | Discharge: 2024-06-15 | Disposition: A | Discharge status: Home / Self Care | Source: Ambulatory Visit | Attending: Surgery | Admitting: Surgery

## 2024-06-15 VITALS — BP 132/72 | HR 77 | Temp 98.1°F | Resp 16 | Ht 65.0 in | Wt 163.0 lb

## 2024-06-15 DIAGNOSIS — Z01818 Encounter for other preprocedural examination: Secondary | ICD-10-CM | POA: Insufficient documentation

## 2024-06-15 DIAGNOSIS — R7303 Prediabetes: Secondary | ICD-10-CM

## 2024-06-15 DIAGNOSIS — I10 Essential (primary) hypertension: Secondary | ICD-10-CM

## 2024-06-15 HISTORY — DX: Pneumonia, unspecified organism: J18.9

## 2024-06-15 LAB — COMPREHENSIVE METABOLIC PANEL WITH GFR
ALT: 35 U/L (ref 0–44)
AST: 37 U/L (ref 15–41)
Albumin: 4.7 g/dL (ref 3.5–5.0)
Alkaline Phosphatase: 83 U/L (ref 38–126)
Anion gap: 17 — ABNORMAL HIGH (ref 5–15)
BUN: 19 mg/dL (ref 8–23)
CO2: 20 mmol/L — ABNORMAL LOW (ref 22–32)
Calcium: 10.4 mg/dL — ABNORMAL HIGH (ref 8.9–10.3)
Chloride: 101 mmol/L (ref 98–111)
Creatinine, Ser: 0.75 mg/dL (ref 0.44–1.00)
GFR, Estimated: >60 mL/min (ref >60)
Glucose, Bld: 84 mg/dL (ref 70–99)
Potassium: 3.8 mmol/L (ref 3.5–5.1)
Sodium: 139 mmol/L (ref 135–145)
Total Bilirubin: 0.3 mg/dL (ref 0.0–1.2)
Total Protein: 8 g/dL (ref 6.5–8.1)

## 2024-06-15 LAB — CBC WITH DIFFERENTIAL/PLATELET
Abs Immature Granulocytes: 0.05 K/uL (ref 0.00–0.07)
Basophils Absolute: 0.1 K/uL (ref 0.0–0.1)
Basophils Relative: 1 %
Eosinophils Absolute: 0.2 K/uL (ref 0.0–0.5)
Eosinophils Relative: 2 %
HCT: 40.7 % (ref 36.0–46.0)
Hemoglobin: 13 g/dL (ref 12.0–15.0)
Immature Granulocytes: 1 %
Lymphocytes Relative: 22 %
Lymphs Abs: 2.2 K/uL (ref 0.7–4.0)
MCH: 29.1 pg (ref 26.0–34.0)
MCHC: 31.9 g/dL (ref 30.0–36.0)
MCV: 91.3 fL (ref 80.0–100.0)
Monocytes Absolute: 0.8 K/uL (ref 0.1–1.0)
Monocytes Relative: 8 %
Neutro Abs: 6.7 K/uL (ref 1.7–7.7)
Neutrophils Relative %: 66 %
Platelets: 393 K/uL (ref 150–400)
RBC: 4.46 MIL/uL (ref 3.87–5.11)
RDW: 12.8 % (ref 11.5–15.5)
WBC: 10 K/uL (ref 4.0–10.5)
nRBC: 0 % (ref 0.0–0.2)

## 2024-06-17 NOTE — Anesthesia Preprocedure Evaluation (Signed)
 Anesthesia Evaluation  Patient identified by MRN, date of birth, ID band Patient awake    Reviewed: Allergy & Precautions, NPO status , Patient's Chart, lab work & pertinent test results  History of Anesthesia Complications (+) PONV and history of anesthetic complications  Airway Mallampati: II  TM Distance: >3 FB Neck ROM: Full    Dental  (+) Dental Advisory Given, Teeth Intact   Pulmonary sleep apnea , pneumonia, former smoker   Pulmonary exam normal breath sounds clear to auscultation       Cardiovascular hypertension, Pt. on medications Normal cardiovascular exam Rhythm:Regular Rate:Normal  Echo 2019 Left ventricle: The cavity size was normal. Wall thickness was    normal. Systolic function was normal. The estimated ejection    fraction was in the range of 55% to 60%. Wall motion was normal;    there were no regional wall motion abnormalities. Doppler    parameters are consistent with abnormal left ventricular    relaxation (grade 1 diastolic dysfunction).  - Right ventricle: Systolic function was mildly to moderately    reduced.     Neuro/Psych  Headaches PSYCHIATRIC DISORDERS Anxiety        GI/Hepatic Neg liver ROS,GERD  Medicated,,  Endo/Other  Hypothyroidism    Renal/GU negative Renal ROSK+ 4.0 Cr 0.79     Musculoskeletal  (+) Arthritis ,    Abdominal   Peds  Hematology   Anesthesia Other Findings   Reproductive/Obstetrics                              Anesthesia Physical Anesthesia Plan  ASA: 2  Anesthesia Plan: General   Post-op Pain Management: Tylenol  PO (pre-op)* and Gabapentin  PO (pre-op)*   Induction: Intravenous  PONV Risk Score and Plan: 4 or greater and Treatment may vary due to age or medical condition, Midazolam , Dexamethasone  and Ondansetron   Airway Management Planned: Oral ETT  Additional Equipment: None  Intra-op Plan:   Post-operative Plan:  Extubation in OR  Informed Consent: I have reviewed the patients History and Physical, chart, labs and discussed the procedure including the risks, benefits and alternatives for the proposed anesthesia with the patient or authorized representative who has indicated his/her understanding and acceptance.     Dental advisory given  Plan Discussed with: CRNA  Anesthesia Plan Comments: (2 x PIV)         Anesthesia Quick Evaluation

## 2024-06-18 ENCOUNTER — Encounter (HOSPITAL_COMMUNITY): Payer: Self-pay | Admitting: Surgery

## 2024-06-18 ENCOUNTER — Encounter (HOSPITAL_COMMUNITY)
Admission: RE | Disposition: A | Discharge status: Home / Self Care | Payer: Self-pay | Source: Home / Self Care | Attending: Surgery

## 2024-06-18 ENCOUNTER — Inpatient Hospital Stay (HOSPITAL_COMMUNITY)
Admission: RE | Admit: 2024-06-18 | Discharge: 2024-06-21 | DRG: 331 | Disposition: A | Discharge status: Home / Self Care | Attending: Surgery | Admitting: Surgery

## 2024-06-18 ENCOUNTER — Encounter (HOSPITAL_COMMUNITY): Payer: Self-pay | Admitting: Physician Assistant

## 2024-06-18 ENCOUNTER — Inpatient Hospital Stay (HOSPITAL_COMMUNITY): Payer: Self-pay | Admitting: Anesthesiology

## 2024-06-18 DIAGNOSIS — Z888 Allergy status to other drugs, medicaments and biological substances status: Secondary | ICD-10-CM | POA: Diagnosis not present

## 2024-06-18 DIAGNOSIS — Z01818 Encounter for other preprocedural examination: Secondary | ICD-10-CM | POA: Diagnosis not present

## 2024-06-18 DIAGNOSIS — K760 Fatty (change of) liver, not elsewhere classified: Secondary | ICD-10-CM | POA: Diagnosis present

## 2024-06-18 DIAGNOSIS — Z9221 Personal history of antineoplastic chemotherapy: Secondary | ICD-10-CM

## 2024-06-18 DIAGNOSIS — E876 Hypokalemia: Secondary | ICD-10-CM | POA: Diagnosis not present

## 2024-06-18 DIAGNOSIS — K5732 Diverticulitis of large intestine without perforation or abscess without bleeding: Principal | ICD-10-CM | POA: Diagnosis present

## 2024-06-18 DIAGNOSIS — Z881 Allergy status to other antibiotic agents status: Secondary | ICD-10-CM | POA: Diagnosis not present

## 2024-06-18 DIAGNOSIS — Z885 Allergy status to narcotic agent status: Secondary | ICD-10-CM | POA: Diagnosis not present

## 2024-06-18 DIAGNOSIS — K660 Peritoneal adhesions (postprocedural) (postinfection): Secondary | ICD-10-CM | POA: Diagnosis present

## 2024-06-18 DIAGNOSIS — Z9012 Acquired absence of left breast and nipple: Secondary | ICD-10-CM

## 2024-06-18 DIAGNOSIS — E7800 Pure hypercholesterolemia, unspecified: Secondary | ICD-10-CM | POA: Diagnosis present

## 2024-06-18 DIAGNOSIS — G4733 Obstructive sleep apnea (adult) (pediatric): Secondary | ICD-10-CM | POA: Diagnosis present

## 2024-06-18 DIAGNOSIS — Z9049 Acquired absence of other specified parts of digestive tract: Principal | ICD-10-CM

## 2024-06-18 DIAGNOSIS — M109 Gout, unspecified: Secondary | ICD-10-CM | POA: Diagnosis present

## 2024-06-18 DIAGNOSIS — I700 Atherosclerosis of aorta: Secondary | ICD-10-CM | POA: Diagnosis present

## 2024-06-18 DIAGNOSIS — Z923 Personal history of irradiation: Secondary | ICD-10-CM

## 2024-06-18 DIAGNOSIS — K5792 Diverticulitis of intestine, part unspecified, without perforation or abscess without bleeding: Secondary | ICD-10-CM | POA: Diagnosis present

## 2024-06-18 DIAGNOSIS — Z853 Personal history of malignant neoplasm of breast: Secondary | ICD-10-CM

## 2024-06-18 DIAGNOSIS — K7460 Unspecified cirrhosis of liver: Secondary | ICD-10-CM | POA: Diagnosis present

## 2024-06-18 DIAGNOSIS — Z8249 Family history of ischemic heart disease and other diseases of the circulatory system: Secondary | ICD-10-CM | POA: Diagnosis not present

## 2024-06-18 DIAGNOSIS — I10 Essential (primary) hypertension: Secondary | ICD-10-CM | POA: Diagnosis present

## 2024-06-18 DIAGNOSIS — Z87891 Personal history of nicotine dependence: Secondary | ICD-10-CM

## 2024-06-18 HISTORY — PX: FLEXIBLE SIGMOIDOSCOPY: SHX5431

## 2024-06-18 HISTORY — PX: XI ROBOTIC ASSISTED LOWER ANTERIOR RESECTION: SHX6558

## 2024-06-18 LAB — TYPE AND SCREEN
ABO/RH(D): A NEG
Antibody Screen: NEGATIVE

## 2024-06-18 LAB — CBC
HCT: 38.1 % (ref 36.0–46.0)
Hemoglobin: 12.3 g/dL (ref 12.0–15.0)
MCH: 29.8 pg (ref 26.0–34.0)
MCHC: 32.3 g/dL (ref 30.0–36.0)
MCV: 92.3 fL (ref 80.0–100.0)
Platelets: 301 K/uL (ref 150–400)
RBC: 4.13 MIL/uL (ref 3.87–5.11)
RDW: 12.7 % (ref 11.5–15.5)
WBC: 15.5 K/uL — ABNORMAL HIGH (ref 4.0–10.5)
nRBC: 0 % (ref 0.0–0.2)

## 2024-06-18 LAB — CREATININE, SERUM
Creatinine, Ser: 0.84 mg/dL (ref 0.44–1.00)
GFR, Estimated: >60 mL/min (ref >60)

## 2024-06-18 LAB — ABO/RH: ABO/RH(D): A NEG

## 2024-06-18 SURGERY — RESECTION, RECTUM, LOW ANTERIOR, ROBOT-ASSISTED
Anesthesia: General | Site: Abdomen

## 2024-06-18 MED ORDER — CHLORHEXIDINE GLUCONATE 0.12 % MT SOLN: 15.0000 mL | Freq: Once | OROMUCOSAL | Status: DC

## 2024-06-18 MED ORDER — LACTATED RINGERS IV SOLN: INTRAVENOUS | Status: DC | PRN

## 2024-06-18 MED ORDER — ALBUTEROL SULFATE (2.5 MG/3ML) 0.083% IN NEBU: 2.5000 mg | INHALATION_SOLUTION | Freq: Four times a day (QID) | RESPIRATORY_TRACT | Status: DC | PRN

## 2024-06-18 MED ORDER — POTASSIUM CHLORIDE ER 10 MEQ PO TBCR
10.0000 meq | EXTENDED_RELEASE_TABLET | Freq: Every day | ORAL | Status: DC
Start: 2024-06-19 — End: 2024-06-21
  Administered 2024-06-19 – 2024-06-21 (×3): 10 meq via ORAL
  Filled 2024-06-18 (×5): qty 1

## 2024-06-18 MED ORDER — PROPOFOL 10 MG/ML IV BOLUS
INTRAVENOUS | Status: DC | PRN
  Administered 2024-06-18: 50 mg via INTRAVENOUS
  Administered 2024-06-18: 110 mg via INTRAVENOUS

## 2024-06-18 MED ORDER — SIMETHICONE 80 MG PO CHEW
40.0000 mg | CHEWABLE_TABLET | Freq: Four times a day (QID) | ORAL | Status: DC | PRN
  Administered 2024-06-18 (×2): 40 mg via ORAL
  Filled 2024-06-18 (×2): qty 1

## 2024-06-18 MED ORDER — HEPARIN SODIUM (PORCINE) 5000 UNIT/ML IJ SOLN
5000.0000 [IU] | Freq: Once | INTRAMUSCULAR | Status: AC
  Administered 2024-06-18: 5000 [IU] via SUBCUTANEOUS
  Filled 2024-06-18: qty 1

## 2024-06-18 MED ORDER — TRAMADOL HCL 50 MG PO TABS
50.0000 mg | ORAL_TABLET | Freq: Four times a day (QID) | ORAL | Status: DC | PRN
  Administered 2024-06-18 (×2): 50 mg via ORAL
  Filled 2024-06-18 (×2): qty 1

## 2024-06-18 MED ORDER — ALVIMOPAN 12 MG PO CAPS
12.0000 mg | ORAL_CAPSULE | Freq: Two times a day (BID) | ORAL | Status: DC
Start: 2024-06-19 — End: 2024-06-20
  Administered 2024-06-19 – 2024-06-20 (×2): 12 mg via ORAL
  Filled 2024-06-18 (×2): qty 1

## 2024-06-18 MED ORDER — BUPIVACAINE-EPINEPHRINE (PF) 0.25% -1:200000 IJ SOLN
INTRAMUSCULAR | Status: DC | PRN
  Administered 2024-06-18: 60 mL

## 2024-06-18 MED ORDER — TRAMADOL HCL 50 MG PO TABS: 50.0000 mg | ORAL_TABLET | Freq: Four times a day (QID) | ORAL | 0 refills | Status: AC | PRN

## 2024-06-18 MED ORDER — LACTATED RINGERS IV SOLN: INTRAVENOUS | Status: DC

## 2024-06-18 MED ORDER — PHENYLEPHRINE 80 MCG/ML (10ML) SYRINGE FOR IV PUSH (FOR BLOOD PRESSURE SUPPORT)
PREFILLED_SYRINGE | INTRAVENOUS | Status: DC | PRN
  Administered 2024-06-18: 80 ug via INTRAVENOUS

## 2024-06-18 MED ORDER — MIDAZOLAM HCL 2 MG/2ML IJ SOLN
INTRAMUSCULAR | Status: AC
  Filled 2024-06-18: qty 2

## 2024-06-18 MED ORDER — HYDROMORPHONE HCL 1 MG/ML IJ SOLN: 0.2500 mg | INTRAMUSCULAR | Status: DC | PRN

## 2024-06-18 MED ORDER — LORATADINE 10 MG PO TABS
10.0000 mg | ORAL_TABLET | Freq: Every day | ORAL | Status: DC
  Administered 2024-06-20 – 2024-06-21 (×2): 10 mg via ORAL
  Filled 2024-06-18 (×3): qty 1

## 2024-06-18 MED ORDER — CHLORHEXIDINE GLUCONATE CLOTH 2 % EX PADS: 6.0000 | MEDICATED_PAD | Freq: Once | CUTANEOUS | Status: DC

## 2024-06-18 MED ORDER — PROPOFOL 10 MG/ML IV BOLUS
INTRAVENOUS | Status: AC
Start: 2024-06-18 — End: 2024-06-18
  Filled 2024-06-18: qty 20

## 2024-06-18 MED ORDER — ROCURONIUM BROMIDE 100 MG/10ML IV SOLN
INTRAVENOUS | Status: DC | PRN
  Administered 2024-06-18: 60 mg via INTRAVENOUS
  Administered 2024-06-18: 20 mg via INTRAVENOUS

## 2024-06-18 MED ORDER — LEVOTHYROXINE SODIUM 150 MCG PO TABS
150.0000 ug | ORAL_TABLET | Freq: Every day | ORAL | Status: DC
  Administered 2024-06-19 – 2024-06-21 (×3): 150 ug via ORAL
  Filled 2024-06-18: qty 1
  Filled 2024-06-18: qty 2
  Filled 2024-06-18: qty 3
  Filled 2024-06-18: qty 1
  Filled 2024-06-18: qty 3
  Filled 2024-06-18: qty 1

## 2024-06-18 MED ORDER — SUGAMMADEX SODIUM 200 MG/2ML IV SOLN
INTRAVENOUS | Status: AC
  Filled 2024-06-18: qty 2

## 2024-06-18 MED ORDER — ALUM & MAG HYDROXIDE-SIMETH 200-200-20 MG/5ML PO SUSP: 30.0000 mL | Freq: Four times a day (QID) | ORAL | Status: DC | PRN

## 2024-06-18 MED ORDER — 0.9 % SODIUM CHLORIDE (POUR BTL) OPTIME
TOPICAL | Status: DC | PRN
  Administered 2024-06-18: 2000 mL

## 2024-06-18 MED ORDER — LIDOCAINE HCL (PF) 2 % IJ SOLN
INTRAMUSCULAR | Status: AC
  Filled 2024-06-18: qty 5

## 2024-06-18 MED ORDER — ONDANSETRON HCL 4 MG PO TABS: 4.0000 mg | ORAL_TABLET | Freq: Four times a day (QID) | ORAL | Status: DC | PRN

## 2024-06-18 MED ORDER — FLUTICASONE PROPIONATE 50 MCG/ACT NA SUSP: 2.0000 | Freq: Every day | NASAL | Status: DC | PRN

## 2024-06-18 MED ORDER — ALLOPURINOL 100 MG PO TABS
100.0000 mg | ORAL_TABLET | ORAL | Status: DC
  Administered 2024-06-21: 100 mg via ORAL
  Filled 2024-06-18: qty 1

## 2024-06-18 MED ORDER — POLYETHYLENE GLYCOL 3350 17 GM/SCOOP PO POWD: 238.0000 g | Freq: Once | ORAL | Status: DC

## 2024-06-18 MED ORDER — ALVIMOPAN 12 MG PO CAPS
12.0000 mg | ORAL_CAPSULE | ORAL | Status: AC
Start: 2024-06-18 — End: 2024-06-18
  Administered 2024-06-18: 12 mg via ORAL
  Filled 2024-06-18: qty 1

## 2024-06-18 MED ORDER — ORAL CARE MOUTH RINSE: 15.0000 mL | Freq: Once | OROMUCOSAL | Status: DC

## 2024-06-18 MED ORDER — METRONIDAZOLE 500 MG PO TABS: 1000.0000 mg | ORAL_TABLET | ORAL | Status: DC

## 2024-06-18 MED ORDER — LACTATED RINGERS IR SOLN
Status: DC | PRN
  Administered 2024-06-18: 1000 mL

## 2024-06-18 MED ORDER — NEOMYCIN SULFATE 500 MG PO TABS: 1000.0000 mg | ORAL_TABLET | ORAL | Status: DC

## 2024-06-18 MED ORDER — ONDANSETRON HCL 4 MG/2ML IJ SOLN: 4.0000 mg | Freq: Four times a day (QID) | INTRAMUSCULAR | Status: DC | PRN

## 2024-06-18 MED ORDER — ONDANSETRON HCL 4 MG/2ML IJ SOLN
INTRAMUSCULAR | Status: DC | PRN
Start: 2024-06-18 — End: 2024-06-18
  Administered 2024-06-18: 4 mg via INTRAVENOUS

## 2024-06-18 MED ORDER — LIOTHYRONINE SODIUM 5 MCG PO TABS
5.0000 ug | ORAL_TABLET | Freq: Every day | ORAL | Status: DC
  Administered 2024-06-19 – 2024-06-21 (×3): 5 ug via ORAL
  Filled 2024-06-18 (×3): qty 1

## 2024-06-18 MED ORDER — MIDAZOLAM HCL 5 MG/5ML IJ SOLN
INTRAMUSCULAR | Status: DC | PRN
  Administered 2024-06-18: 2 mg via INTRAVENOUS

## 2024-06-18 MED ORDER — IBUPROFEN 400 MG PO TABS: 600.0000 mg | ORAL_TABLET | Freq: Four times a day (QID) | ORAL | Status: DC | PRN

## 2024-06-18 MED ORDER — AZELASTINE HCL 0.1 % NA SOLN: 1.0000 | Freq: Every day | NASAL | Status: DC | PRN

## 2024-06-18 MED ORDER — FENTANYL CITRATE (PF) 100 MCG/2ML IJ SOLN
INTRAMUSCULAR | Status: DC | PRN
  Administered 2024-06-18 (×2): 50 ug via INTRAVENOUS

## 2024-06-18 MED ORDER — DROPERIDOL 2.5 MG/ML IJ SOLN: 0.6250 mg | Freq: Once | INTRAMUSCULAR | Status: DC | PRN

## 2024-06-18 MED ORDER — ONDANSETRON HCL 4 MG/2ML IJ SOLN
INTRAMUSCULAR | Status: AC
  Filled 2024-06-18: qty 2

## 2024-06-18 MED ORDER — ACETAMINOPHEN 500 MG PO TABS
1000.0000 mg | ORAL_TABLET | ORAL | Status: DC
  Filled 2024-06-18: qty 2

## 2024-06-18 MED ORDER — DIPHENHYDRAMINE HCL 12.5 MG/5ML PO ELIX: 12.5000 mg | ORAL_SOLUTION | Freq: Four times a day (QID) | ORAL | Status: DC | PRN

## 2024-06-18 MED ORDER — TRIAMTERENE-HCTZ 37.5-25 MG PO TABS
1.0000 | ORAL_TABLET | Freq: Every day | ORAL | Status: DC
Start: 2024-06-19 — End: 2024-06-21
  Administered 2024-06-19 – 2024-06-21 (×3): 1 via ORAL
  Filled 2024-06-18 (×3): qty 1

## 2024-06-18 MED ORDER — DIPHENHYDRAMINE HCL 50 MG/ML IJ SOLN: 12.5000 mg | Freq: Four times a day (QID) | INTRAMUSCULAR | Status: DC | PRN

## 2024-06-18 MED ORDER — SUGAMMADEX SODIUM 200 MG/2ML IV SOLN
INTRAVENOUS | Status: DC | PRN
  Administered 2024-06-18: 200 mg via INTRAVENOUS

## 2024-06-18 MED ORDER — ENSURE PRE-SURGERY PO LIQD: 296.0000 mL | Freq: Once | ORAL | Status: DC

## 2024-06-18 MED ORDER — ROCURONIUM BROMIDE 10 MG/ML (PF) SYRINGE
PREFILLED_SYRINGE | INTRAVENOUS | Status: AC
  Filled 2024-06-18: qty 10

## 2024-06-18 MED ORDER — HYDROMORPHONE HCL 1 MG/ML IJ SOLN: 0.5000 mg | INTRAMUSCULAR | Status: DC | PRN

## 2024-06-18 MED ORDER — DEXAMETHASONE SODIUM PHOSPHATE 10 MG/ML IJ SOLN
INTRAMUSCULAR | Status: AC
  Filled 2024-06-18: qty 1

## 2024-06-18 MED ORDER — HYDRALAZINE HCL 20 MG/ML IJ SOLN: 10.0000 mg | INTRAMUSCULAR | Status: DC | PRN

## 2024-06-18 MED ORDER — ACETAMINOPHEN 500 MG PO TABS
1000.0000 mg | ORAL_TABLET | Freq: Four times a day (QID) | ORAL | Status: DC
Start: 2024-06-18 — End: 2024-06-21
  Administered 2024-06-18 – 2024-06-21 (×9): 1000 mg via ORAL
  Filled 2024-06-18 (×11): qty 2

## 2024-06-18 MED ORDER — HEPARIN SODIUM (PORCINE) 5000 UNIT/ML IJ SOLN
5000.0000 [IU] | Freq: Three times a day (TID) | INTRAMUSCULAR | Status: DC
  Administered 2024-06-18 – 2024-06-21 (×8): 5000 [IU] via SUBCUTANEOUS
  Filled 2024-06-18 (×8): qty 1

## 2024-06-18 MED ORDER — FENTANYL CITRATE (PF) 100 MCG/2ML IJ SOLN
INTRAMUSCULAR | Status: AC
  Filled 2024-06-18: qty 2

## 2024-06-18 MED ORDER — BISACODYL 5 MG PO TBEC
20.0000 mg | DELAYED_RELEASE_TABLET | Freq: Once | ORAL | Status: DC
Start: 2024-06-18 — End: 2024-06-18

## 2024-06-18 MED ORDER — ENSURE PRE-SURGERY PO LIQD: 592.0000 mL | Freq: Once | ORAL | Status: DC

## 2024-06-18 MED ORDER — BUPIVACAINE-EPINEPHRINE (PF) 0.25% -1:200000 IJ SOLN
INTRAMUSCULAR | Status: AC
  Filled 2024-06-18: qty 60

## 2024-06-18 MED ORDER — DICYCLOMINE HCL 10 MG PO CAPS: 10.0000 mg | ORAL_CAPSULE | Freq: Three times a day (TID) | ORAL | Status: DC | PRN

## 2024-06-18 MED ORDER — ENSURE SURGERY PO LIQD
237.0000 mL | Freq: Two times a day (BID) | ORAL | Status: DC
  Administered 2024-06-18 – 2024-06-20 (×3): 237 mL via ORAL
  Filled 2024-06-18 (×5): qty 237

## 2024-06-18 MED ORDER — LIDOCAINE HCL (CARDIAC) PF 100 MG/5ML IV SOSY
PREFILLED_SYRINGE | INTRAVENOUS | Status: DC | PRN
  Administered 2024-06-18: 60 mg via INTRAVENOUS

## 2024-06-18 MED ORDER — SODIUM CHLORIDE 0.9 % IV SOLN
2.0000 g | INTRAVENOUS | Status: AC
  Administered 2024-06-18: 2 g via INTRAVENOUS
  Filled 2024-06-18: qty 2

## 2024-06-18 MED ORDER — DILTIAZEM HCL ER COATED BEADS 240 MG PO CP24
240.0000 mg | ORAL_CAPSULE | Freq: Every day | ORAL | Status: DC
Start: 2024-06-19 — End: 2024-06-21
  Administered 2024-06-19 – 2024-06-21 (×3): 240 mg via ORAL
  Filled 2024-06-18 (×3): qty 1

## 2024-06-18 MED ORDER — DEXAMETHASONE SODIUM PHOSPHATE 10 MG/ML IJ SOLN
INTRAMUSCULAR | Status: DC | PRN
  Administered 2024-06-18: 5 mg via INTRAVENOUS

## 2024-06-18 SURGICAL SUPPLY — 89 items
BAG COUNTER SPONGE SURGICOUNT (BAG) IMPLANT
BLADE EXTENDED COATED 6.5IN (ELECTRODE) ×2 IMPLANT
CANNULA REDUCER 12-8 DVNC XI (CANNULA) ×2 IMPLANT
CELLS DAT CNTRL 66122 CELL SVR (MISCELLANEOUS) IMPLANT
CHLORAPREP W/TINT 26 (MISCELLANEOUS) ×2 IMPLANT
CLIP APPLIE 5 13 M/L LIGAMAX5 (MISCELLANEOUS) IMPLANT
CLIP APPLIE ROT 10 11.4 M/L (STAPLE) IMPLANT
CLIP LIGATING HEM O LOK PURPLE (MISCELLANEOUS) IMPLANT
CLIP LIGATING HEMO O LOK GREEN (MISCELLANEOUS) IMPLANT
COVER SURGICAL LIGHT HANDLE (MISCELLANEOUS) ×4 IMPLANT
COVER TIP SHEARS 8 DVNC (MISCELLANEOUS) ×2 IMPLANT
DEFOGGER SCOPE WARM SEASHARP (MISCELLANEOUS) ×4 IMPLANT
DERMABOND ADVANCED .7 DNX12 (GAUZE/BANDAGES/DRESSINGS) IMPLANT
DEVICE TROCAR PUNCTURE CLOSURE (ENDOMECHANICALS) IMPLANT
DRAIN CHANNEL 19F RND (DRAIN) ×2 IMPLANT
DRAPE ARM DVNC X/XI (DISPOSABLE) ×8 IMPLANT
DRAPE COLUMN DVNC XI (DISPOSABLE) ×2 IMPLANT
DRAPE CV SPLIT W-CLR ANES SCRN (DRAPES) ×2 IMPLANT
DRAPE PERI GROIN 82X75IN TIB (DRAPES) ×2 IMPLANT
DRAPE SHEET LG 3/4 BI-LAMINATE (DRAPES) IMPLANT
DRAPE SURG IRRIG POUCH 19X23 (DRAPES) ×2 IMPLANT
DRIVER NDL LRG 8 DVNC XI (INSTRUMENTS) ×2 IMPLANT
DRIVER NDLE LRG 8 DVNC XI (INSTRUMENTS) IMPLANT
DRSG OPSITE POSTOP 4X10 (GAUZE/BANDAGES/DRESSINGS) IMPLANT
DRSG OPSITE POSTOP 4X6 (GAUZE/BANDAGES/DRESSINGS) IMPLANT
DRSG OPSITE POSTOP 4X8 (GAUZE/BANDAGES/DRESSINGS) IMPLANT
DRSG TEGADERM 2-3/8X2-3/4 SM (GAUZE/BANDAGES/DRESSINGS) ×10 IMPLANT
DRSG TEGADERM 4X4.75 (GAUZE/BANDAGES/DRESSINGS) ×2 IMPLANT
DURAPREP 26ML APPLICATOR (WOUND CARE) IMPLANT
ELECT REM PT RETURN 15FT ADLT (MISCELLANEOUS) ×2 IMPLANT
ENDOLOOP SUT PDS II 0 18 (SUTURE) IMPLANT
EVACUATOR SILICONE 100CC (DRAIN) ×2 IMPLANT
FORCEPS BPLR FENES DVNC XI (FORCEP) IMPLANT
GAUZE SPONGE 2X2 8PLY STRL LF (GAUZE/BANDAGES/DRESSINGS) ×2 IMPLANT
GAUZE SPONGE 4X4 12PLY STRL (GAUZE/BANDAGES/DRESSINGS) IMPLANT
GLOVE BIO SURGEON STRL SZ7.5 (GLOVE) ×6 IMPLANT
GLOVE INDICATOR 8.0 STRL GRN (GLOVE) ×6 IMPLANT
GOWN SRG XL LVL 4 BRTHBL STRL (GOWNS) ×2 IMPLANT
GOWN STRL REUS W/ TWL XL LVL3 (GOWN DISPOSABLE) ×10 IMPLANT
GRASPER SUT TROCAR 14GX15 (MISCELLANEOUS) IMPLANT
GRASPER TIP-UP FEN DVNC XI (INSTRUMENTS) ×2 IMPLANT
HOLDER FOLEY CATH W/STRAP (MISCELLANEOUS) ×2 IMPLANT
IRRIGATION SUCT STRKRFLW 2 WTP (MISCELLANEOUS) ×2 IMPLANT
KIT PROCEDURE DVNC SI (MISCELLANEOUS) IMPLANT
KIT TURNOVER KIT A (KITS) ×2 IMPLANT
NDL INSUFFLATION 14GA 120MM (NEEDLE) ×2 IMPLANT
NEEDLE INSUFFLATION 14GA 120MM (NEEDLE) ×2 IMPLANT
PACK COLON (CUSTOM PROCEDURE TRAY) ×2 IMPLANT
PAD POSITIONING PINK XL (MISCELLANEOUS) ×2 IMPLANT
PENCIL SMOKE EVACUATOR (MISCELLANEOUS) IMPLANT
PROTECTOR NERVE ULNAR (MISCELLANEOUS) ×4 IMPLANT
RELOAD STAPLE 45 4.3 GRN DVNC (STAPLE) IMPLANT
RELOAD STAPLE 60 3.5 BLU DVNC (STAPLE) IMPLANT
RELOAD STAPLE 60 4.3 GRN DVNC (STAPLE) IMPLANT
RETRACTOR WND ALEXIS 18 MED (MISCELLANEOUS) IMPLANT
SCISSORS LAP 5X35 DISP (ENDOMECHANICALS) IMPLANT
SCISSORS MNPLR CVD DVNC XI (INSTRUMENTS) ×2 IMPLANT
SEAL UNIV 5-12 XI (MISCELLANEOUS) ×8 IMPLANT
SEALER VESSEL EXT DVNC XI (MISCELLANEOUS) ×2 IMPLANT
SLEEVE ADV FIXATION 5X100MM (TROCAR) IMPLANT
SOLUTION ELECTROSURG ANTI STCK (MISCELLANEOUS) ×2 IMPLANT
SPIKE FLUID TRANSFER (MISCELLANEOUS) ×2 IMPLANT
STAPLER 60 SUREFORM DVNC (STAPLE) IMPLANT
STAPLER ECHELON POWER CIR 29 (STAPLE) IMPLANT
STAPLER ECHELON POWER CIR 31 (STAPLE) IMPLANT
STOPCOCK 4 WAY LG BORE MALE ST (IV SETS) ×4 IMPLANT
SURGILUBE 2OZ TUBE FLIPTOP (MISCELLANEOUS) ×2 IMPLANT
SUT MNCRL AB 4-0 PS2 18 (SUTURE) ×2 IMPLANT
SUT PDS AB 1 CT1 27 (SUTURE) IMPLANT
SUT PDS AB 1 TP1 96 (SUTURE) IMPLANT
SUT PROLENE 0 CT 2 (SUTURE) IMPLANT
SUT PROLENE 2 0 KS (SUTURE) ×2 IMPLANT
SUT PROLENE 2 0 SH DA (SUTURE) IMPLANT
SUT SILK 2 0 SH CR/8 (SUTURE) IMPLANT
SUT SILK 2-0 18XBRD TIE 12 (SUTURE) IMPLANT
SUT SILK 3 0 SH CR/8 (SUTURE) ×2 IMPLANT
SUT SILK 3-0 18XBRD TIE 12 (SUTURE) ×2 IMPLANT
SUT VIC AB 3-0 SH 18 (SUTURE) IMPLANT
SUT VIC AB 3-0 SH 27XBRD (SUTURE) IMPLANT
SUT VICRYL 0 UR6 27IN ABS (SUTURE) ×2 IMPLANT
SUTURE V-LC BRB 180 2/0GR6GS22 (SUTURE) IMPLANT
SYR 10ML LL (SYRINGE) ×2 IMPLANT
SYSTEM LAPSCP GELPORT 120MM (MISCELLANEOUS) IMPLANT
SYSTEM WOUND ALEXIS 18CM MED (MISCELLANEOUS) ×2 IMPLANT
TAPE UMBILICAL 1/8 X36 TWILL (MISCELLANEOUS) ×2 IMPLANT
TRAY FOLEY MTR SLVR 14FR STAT (SET/KITS/TRAYS/PACK) IMPLANT
TROCAR ADV FIXATION 5X100MM (TROCAR) ×2 IMPLANT
TUBING CONNECTING 10 (TUBING) ×6 IMPLANT
TUBING INSUFFLATION 10FT LAP (TUBING) ×2 IMPLANT

## 2024-06-18 NOTE — H&P (Signed)
 CC: Here today for surgery  HPI: Alexandria Bradley is an 63 y.o. female with history of breast cancer, hypertension, IBS, gout, whom is seen in the office today as a referral by Dr. Arch for evaluation of diverticulitis.   CT A/P 01/08/23  IMPRESSION: 1. Evidence of mild acute diverticulitis of the sigmoid colon. No evidence of perforation or diverticular abscess. 2. Few small bilateral subcentimeter renal cortical hypodensities too small to characterize, but likely cysts. These are unchanged as no further imaging follow-up is recommended. 3. Aortic atherosclerosis. 4. 3 mm nodule over the right middle lobe unchanged from 2022. No further follow-up recommended.  CT A/P 04/22/23 IMPRESSION: 1. Acute sigmoid colon diverticulitis with micro perforation. No abscess.  Colonoscopy Dr. Aneita 06/10/23 IMPRESSION - Normal terminal ileum - One 7 mm polyp in the sigmoid colon, removed with a cold snare. Resected and retrieved. - Moderate diverticulosis in the left colon. - The examination was otherwise normal on direct and retroflexion views. Biopsied.  Pathology: 1. Surgical [P], colon nos, random sites :  - UNREMARKABLE COLONIC MUCOSA.  - NEGATIVE FOR MICROSCOPIC COLITIS, DYSPLASIA, AND MALIGNANCY.   2. Surgical [P], colon, sigmoid, polyp (1) :  - POLYPOID FRAGMENT OF INFLAMED GRANULATION TISSUE/INFLAMMATORY POLYP.  - NEGATIVE FOR DYSPLASIA AND MALIGNANCY.   She is here today with her husband. She reports that she has been doing reasonably well. She does note a history of IBS in the past as well as a history of lymphocytic colitis that she took budesonide  for. Currently, denies any diarrhea. She has on average 1 soft bowel movement per day. She will occasionally have some intermittent twinging type pains in her right and left abdomen but these are typically quite short and self-limited. Clearly different than the symptoms she experienced in both April and July.  She does express a  fair amount of anxiety regarding the risk of recurrent attacks of diverticulitis and is quite concerned about having to be readmitted to the hospital for antibiotics.  INTERVAL HX Readmitted to hospital 04/12/2024 with CT scan of the abdomen/pelvis demonstrating: 1. Colonic diverticulosis with uncomplicated acute diverticulitis of the distal descending/proximal sigmoid colon. Recommend colonoscopy status post treatment and status post complete resolution of inflammatory changes to exclude an underlying lesion. 2. Aortic Atherosclerosis (ICD10-I70.0).  Repeat imaging 04/21/2024-CT abdomen/pelvis: 1. Mildly inflamed diverticulum within the distal descending colon, consistent with mild diverticulitis. 2. Hepatic steatosis. 3. Evidence of prior cholecystectomy. 4. Aortic atherosclerosis.  At our last visit, we had discussed surgery and rationale therein. She had opted to not proceed with having surgery at that juncture. Returns today for follow-up. She is now amenable to proceeding with surgery. Her diverticulitis does appear to be a significant stress factor for her where she is now planning her life around fear of these attacks. Her most recent attack led to her having to truncate a family trip to [place]  where they are working on planning her nieces wedding.  She denies any changes in health or health history since we met in the office. No new medications/allergies. She states she is ready for surgery today.   PMH: Breast cancer, HTN, gout, IBS  PSH: BTL; laparoscopic cholecystectomy; breast surgery  FHx: Denies any known family history of colorectal, breast, endometrial or ovarian cancer  Social Hx: Denies use of tobacco/EtOH/illicit drug. She is happily retired. She previously worked as an Print production planner for a [employer]. She is here today with her husband.   Past Medical History:  Diagnosis Date  Allergy    Anxiety    Arthritis    Back pain    Breast cancer (HCC)    left    Fatty liver    GERD (gastroesophageal reflux disease)    Gout    Headache(784.0)    PMH : migraines   History of kidney stones    Hypercholesterolemia    Hypertension    Hypothyroidism    Hashimoto's   Osteoporosis    Personal history of chemotherapy    Personal history of radiation therapy    Pneumonia    PONV (postoperative nausea and vomiting)    difficult to wake up   Pre-diabetes    Psoriasis    PVC's (premature ventricular contractions)    Radiation 08/18/14-10/07/14   Left breast   Sleep apnea    no CPAP    Past Surgical History:  Procedure Laterality Date   BREAST BIOPSY Left 11/10/2018   BREAST LUMPECTOMY Left 2015   BREAST LUMPECTOMY WITH AXILLARY LYMPH NODE BIOPSY  02/2014   left   BREAST RECONSTRUCTION WITH PLACEMENT OF TISSUE EXPANDER AND ALLODERM Bilateral 04/12/2019   Procedure: BILATERAL BREAST RECONSTRUCTION WITH PLACEMENT OF TISSUE EXPANDERS; ALLODERM TO RIGHT CHEST;  Surgeon: Arelia Filippo, MD;  Location: [facility];  Service: Plastics;  Laterality: Bilateral;   BREAST RECONSTRUCTION WITH PLACEMENT OF TISSUE EXPANDER AND ALLODERM Right 01/24/2020   Procedure: BREAST RECONSTRUCTION WITH PLACEMENT OF TISSUE EXPANDER AND ALLODERM;  Surgeon: Arelia Filippo, MD;  Location: [facility];  Service: Plastics;  Laterality: Right;   CARDIAC CATHETERIZATION     CHOLECYSTECTOMY     Laparoscopic   colon polyps     COLONOSCOPY N/A 06/27/2014   Procedure: COLONOSCOPY;  Surgeon: Jerrell KYM Sol, MD;  Location: [facility];  Service: Endoscopy;  Laterality: N/A;   COLONOSCOPY  02/2020   LATISSIMUS FLAP TO BREAST Left 04/12/2019   Procedure: LEFT LATISSIMUS DORSI FLAP TO LEFT CHEST;  Surgeon: Arelia Filippo, MD;  Location: [facility];  Service: Plastics;  Laterality: Left;   LEFT HEART CATH AND CORONARY ANGIOGRAPHY N/A 07/24/2018   Procedure: LEFT HEART CATH AND CORONARY ANGIOGRAPHY;  Surgeon: Burnard Debby LABOR, MD;  Location: [facility];   Service: Cardiovascular;  Laterality: N/A;   LIPOMA EXCISION     upper right back / shoulder   LIPOSUCTION WITH LIPOFILLING Bilateral 06/12/2020   Procedure: LIPOFILLING TO BILATERAL CHEST;  Surgeon: Arelia Filippo, MD;  Location: [facility];  Service: Plastics;  Laterality: Bilateral;   MASTECTOMY W/ SENTINEL NODE BIOPSY Bilateral 04/12/2019   Procedure: RIGHT BREAST RISK REDUCING MASTECTOMY; LEFT MASTECTOMY WITH LEFT AXILLARY SENTINEL LYMPH NODE BIOPSY WITH BLUE DYE INJECTION;  Surgeon: Ebbie Cough, MD;  Location: [facility];  Service: General;  Laterality: Bilateral;   MYOMECTOMY     PORT-A-CATH REMOVAL N/A 02/03/2015   Procedure: REMOVAL PORT-A-CATH;  Surgeon: Cough Ebbie, MD;  Location: [facility];  Service: General;  Laterality: N/A;   PORT-A-CATH REMOVAL Right 01/24/2020   Procedure: REMOVAL PORT-A-CATH;  Surgeon: Arelia Filippo, MD;  Location: [facility];  Service: Plastics;  Laterality: Right;   PORTACATH PLACEMENT N/A 03/01/2014   Procedure: INSERTION PORT-A-CATH;  Surgeon: Cough Ebbie, MD;  Location:  SURGERY CENTER;  Service: General;  Laterality: N/A;   PORTACATH PLACEMENT N/A 11/18/2018   Procedure: INSERTION PORT-A-CATH WITH ULTRASOUND;  Surgeon: Ebbie Cough, MD;  Location: [facility];  Service: General;  Laterality: N/A;   REMOVAL OF BILATERAL TISSUE EXPANDERS WITH PLACEMENT OF BILATERAL  BREAST IMPLANTS Bilateral 06/12/2020   Procedure: REMOVAL OF BILATERAL TISSUE EXPANDERS WITH PLACEMENT OF BILATERAL BREAST IMPLANTS;  Surgeon: Arelia Filippo, MD;  Location: [facility];  Service: Plastics;  Laterality: Bilateral;   REMOVAL OF TISSUE EXPANDER AND PLACEMENT OF IMPLANT Right 06/02/2019   Procedure: REMOVAL OF TISSUE EXPANDER RIGHT CHEST;  Surgeon: Arelia Filippo, MD;  Location: [facility];  Service: Plastics;  Laterality: Right;   TONSILLECTOMY     TUBAL LIGATION      WOUND DEBRIDEMENT Left 06/02/2019   Procedure: LEFT CHEST DEBRIDEMENT;  Surgeon: Arelia Filippo, MD;  Location: [facility];  Service: Plastics;  Laterality: Left;    Family History  Problem Relation Age of Onset   Endometrial cancer Mother 65   Hearing loss Mother    Atrial fibrillation Mother    Lung cancer Father    Brain cancer Father    Barrett's esophagus Sister    Heart disease Maternal Aunt    Atrial fibrillation Maternal Aunt    Lung cancer Maternal Uncle    Atrial fibrillation Maternal Uncle    Bladder Cancer Maternal Uncle    Multiple myeloma Maternal Uncle    Stroke Maternal Grandmother    Stroke Maternal Grandfather    Other Son        trisomy 13   Colon polyps Cousin    Colon cancer Neg Hx    Esophageal cancer Neg Hx    Rectal cancer Neg Hx    Stomach cancer Neg Hx     Social:  reports that she has quit smoking. Her smoking use included cigarettes. She has a 7.5 pack-year smoking history. She has never used smokeless tobacco. She reports that she does not currently use alcohol. She reports that she does not use drugs.  Allergies:  Allergies  Allergen Reactions   Chlorhexidine  Itching   Ciprofloxacin  Anxiety, Other (See Comments), Rash and Swelling    GI upset   Prednisone  Shortness Of Breath and Other (See Comments)    Jittery, jacks me up     Codeine Nausea Only   Cortisone Swelling and Other (See Comments)    hyper sensitive, jacks me up    Colchicine  Nausea Only    Medications: I have reviewed the patient's current medications.  No results found for this or any previous visit (from the past 48 hours).  No results found.   PE There were no vitals taken for this visit. Constitutional: NAD; conversant Eyes: Moist conjunctiva; no lid lag; anicteric Lungs: Normal respiratory effort CV: RRR GI: Abd soft, NT/ND; no palpable hepatosplenomegaly Psychiatric: Appropriate affect  No results found for this or any previous visit  (from the past 48 hours).  No results found.  A/P: MILDA LINDVALL is an 63 y.o. female with hx of breast cancer, hypertension, IBS, gout, here for evaluation of recurrent diverticulitis  First attack seem to be fairly significant with some degree of underlying sepsis. She did not require percutaneous drainage. She did have a subsequent recurrence 3 months later in July. She remains quite nervous about recurrent attacks including the potential for readmission, antibiotics, and even the potential unlikely situation where she could require urgent surgery.  She has now readmitted with another attack of diverticulitis, this were not requiring percutaneous drain 03/2024. She is now amenable to proceeding with surgery  -The anatomy and physiology of the GI tract was reviewed with the patient. The pathophysiology of diverticulitis was discussed as well with associated pictures. -We have discussed  various different treatment options going forward including surgery (the most definitive) to address this - robotic assisted sigmoidectomy/low anterior resection, flexible sigmoidoscopy, intraoperative assessment of perfusion with ICG. -The planned procedure, material risks (including, but not limited to, pain, bleeding, infection, scarring, need for blood transfusion, damage to surrounding structures- blood vessels/nerves/viscus/organs, damage to ureter, urine leak, leak from anastomosis, need for additional procedures, low anterior resection syndrome (LARS) = increased fecal urgency and/or frequency, scenarios where a stoma may be necessary and where it may be permanent, worsening of pre-existing medical conditions, chronic diarrhea, constipation secondary to narcotic use, blood clots, hernia, recurrence, pneumonia, heart attack, stroke, death) benefits and alternatives to surgery were discussed at length. The patient's questions were answered to her satisfaction, she voiced understanding and has now elected to  proceed. Additionally, we discussed typical postoperative expectations and the recovery process.   Lonni Pizza, MD [facility], [facility]

## 2024-06-18 NOTE — Transfer of Care (Signed)
 Immediate Anesthesia Transfer of Care Note  Patient: Alexandria Bradley  Procedure(s) Performed: RESECTION, RECTUM, LOW ANTERIOR, ROBOT-ASSISTED (Abdomen) SIGMOIDOSCOPY, FLEXIBLE  Patient Location: PACU  Anesthesia Type:General  Level of Consciousness: oriented and drowsy  Airway & Oxygen Therapy: Patient Spontanous Breathing  Post-op Assessment: Report given to RN and Post -op Vital signs reviewed and stable  Post vital signs: Reviewed and stable  Last Vitals:  Vitals Value Taken Time  BP 142/74 06/18/24 13:45  Temp    Pulse 61 06/18/24 13:55  Resp 15 06/18/24 13:55  SpO2 93 % 06/18/24 13:55  Vitals shown include unfiled device data.  Last Pain:  Vitals:   06/18/24 1014  TempSrc:   PainSc: 0-No pain         Complications: No notable events documented.

## 2024-06-18 NOTE — Anesthesia Postprocedure Evaluation (Signed)
 Anesthesia Post Note  Patient: Alexandria Bradley  Procedure(s) Performed: RESECTION, RECTUM, LOW ANTERIOR, ROBOT-ASSISTED (Abdomen) SIGMOIDOSCOPY, FLEXIBLE     Patient location during evaluation: PACU Anesthesia Type: General Level of consciousness: sedated and patient cooperative Pain management: pain level controlled Vital Signs Assessment: post-procedure vital signs reviewed and stable Respiratory status: spontaneous breathing Cardiovascular status: stable Anesthetic complications: no   No notable events documented.  Last Vitals:  Vitals:   06/18/24 1430 06/18/24 1503  BP: 123/63 (!) 142/62  Pulse: (!) 56 66  Resp: 14 16  Temp:  (!) 36.4 C  SpO2: 93% 98%    Last Pain:  Vitals:   06/18/24 1503  TempSrc: Oral  PainSc:                  [name]

## 2024-06-18 NOTE — Op Note (Signed)
 PATIENT: Alexandria Bradley  63 y.o. female  Patient Care Team: [name], DO as PCP - General (Family Medicine) Delford Maude BROCKS, MD as PCP - Cardiology (Cardiology) Gorge Ade, MD as Consulting Physician (Obstetrics and Gynecology) Ebbie Cough, MD as Consulting Physician (General Surgery) Faythe Purchase, MD as Consulting Physician (Endocrinology) Yvone Rush, MD as Consulting Physician (Orthopedic Surgery) Delford Maude BROCKS, MD as Consulting Physician (Cardiology) Arelia Filippo, MD as Consulting Physician (Plastic Surgery) Aneita Gwendlyn ONEIDA, MD (Inactive) as Consulting Physician (Gastroenterology)  PREOP DIAGNOSIS: RECURRENT DIVERTICULITIS  POSTOP DIAGNOSIS: RECURRENT DIVERTICULITIS  PROCEDURE:  Robotic assisted low anterior resection with double stapled colorectal anastomosis Intraoperative assessment of perfusion using ICG fluorescence imaging Flexible sigmoidoscopy Bilateral transversus abdominus plane (TAP) blocks  SURGEON: Lonni HERO. Aneita Kiger, MD  ASSISTANT: Bernarda Ned, MD  An experienced assistant was required given the complexity of this procedure and the standard of surgical care. My assistant helped with exposure through counter tension, suctioning, ligation and retraction to better visualize the surgical field. My assistant expedited sewing during the case by following my sutures. Wherever I use the term we in the report, my assistant actively helped me with that portion of the procedure.   ANESTHESIA: General endotracheal  EBL: 30 mL No intake/output data recorded.  DRAINS: none  SPECIMEN: Rectosigmoid colon - open end proximal  COUNTS: Sponge, needle and instrument counts were reported correct x2  FINDINGS: Fatty liver changes with some macronodular appearing cirrhosis. Chronic adhesions of the sigmoid colon the left lower quadrant related to prior diverticulitis.  Numerous diverticula along the sigmoid colon.  Thickening of the  sigmoid colon and associated mesocolon.  Healthy appearing descending colon and rectum.  A well perfused, tension free, hemostatic, air tight 29 mm EEA colorectal anastomosis fashioned 14 cm from the anal verge by flexible sigmoidoscopy.   NARRATIVE: The patient was seen in the pre-op holding area. The risks, benefits, complications, treatment options, and expected outcomes were previously discussed with the patient. The patient agreed with the proposed plan and has signed the informed consent form. The patient was brought to the operating room by the surgical team, identified as Alexandria Bradley, and the procedure verified. placed supine on the operating table and SCD's were applied. General endotracheal anesthesia was induced without difficulty. She was then positioned in the lithotomy position with Allen stirrups.  Pressure points were evaluated and padded.  A foley catheter was then placed by nursing under sterile conditions. Hair on the abdomen was clipped.  She was secured to the operating table. The abdomen was then prepped and draped in the standard sterile fashion. A time out was completed and the above information confirmed and need for preoperative antibiotics.    An OG tube was placed by anesthesia and confirmed to be to suction.  At Palmer's point, a stab incision was created and the Veress needle was introduced into the peritoneal cavity on the first attempt.  Intraperitoneal location was confirmed by the aspiration and saline drop test.  Pneumoperitoneum was established to a maximum pressure of 15 mmHg using CO2.  Following this, the abdomen was marked for planned trocar sites.  Just to the right and cephalad to the umbilicus, an 8 mm incision was created and an 8 mm blunt tipped robotic trocar was cautiously placed into the peritoneal cavity.  The laparoscope was inserted and demonstrated no evidence of trocar site nor Veress needle site complications.  The Veress needle was removed.   Bilateral transversus abdominis plane blocks were then created  using a dilute mixture of Exparel  with Marcaine .  3 additional 8 mm robotic trochars were placed under direct visualization roughly in a line extending from the right ASIS towards the left upper quadrant. The bladder was inspected and noted to be at/below the pubic symphysis.  Staying 3 fingerbreadths above the pubic symphysis, an incision was created and the 12 mm robotic trocar inserted directed cephalad into the peritoneal cavity under direct visualization.  An additional 5 mm assist port was placed in the right lateral abdomen under direct visualization.  The abdomen was surveyed and there was adhesions of the sigmoid colon and left lower quadrant involving both the colon and appendices epiploica to the abdominal wall.  There is also some thickening of the mesocolon.  All of this appears consistent with her known history of diverticulitis.  She was positioned in Trendelenburg with the left side tilted slightly up.  Small bowel was carefully retracted out of the pelvis.  The robot was then docked and I went to the console.  We began with adhesiolysis. Adhesions consisting of sigmoid colon and appendices epiploica were carefully taken down sharply.  There also adhesions in left lower quadrant overlying the retroperitoneum that were able to carefully tease the colon away from.   Attachments of the sigmoid colon were taken down from the intersigmoid fossa.  The rectosigmoid colon was grasped and elevated anteriorly.  Beginning with a medial to lateral approach, the peritoneum overlying the presacral space was carefully incised.  The TME plane was readily gained working in a plane between the fascia propria of the rectum and the presacral fascia.  Hypogastric nerves were seen going along the the presacral fascia and were protected free of injury.  Working more proximally, the mesorectum and sigmoid mesentery were carefully mobilized off of the  peritoneum.  The left ureter was identified and protected free of injury.  The left gonadal vessels were identified and protected.  These were both swept down.  The superior hemorrhoidal and IMA pedicles were identified. Further mesocolon was mobilized proximally staying in this plane between the retroperitoneum proper and the mesocolon. Attention was then turned to the lateral portion of dissection.  The sigmoid colon was then retracted to the right.  The sigmoid colon was fully mobilized. The descending colon was mobilized by incising the Donyell Ding line of Toldt.  This was done all the way up to the level of the splenic flexure.  The associated mesocolon was also mobilized medially.  The left ureter again was confirmed to be well away from the vasculature which had been dissected medially.  The rectosigmoid colon was elevated anteriorly. The left ureter was re-identified. The IMA was clear of this. The IMA was then divided with the vessel sealer. The stump was inspected and noted to be completely hemostatic with a good seal.  The mesentery was divided out to the point of planned proximal division.  Working more distally, the rectum was identified where the tinea had splayed and there were loss of appendices epiploica.  This also corresponded to a location overlying the sacral promontory.  Anatomically, this clearly represents the proximal rectum.  The mesentery out to this level was then cleared using the vessel sealer. The distal point of transection on the proximal rectum was identified.   A 60 mm green load robotic stapler was then placed through the 12 mm port and introduced into the peritoneal cavity.  The rectum was divided with a single firing of the stapler.  The stump was intact  and healthy in appearance.   Attention was turned to performing a perfusion test. ICG was administered by anesthesia and at the level of the cleared mesentery proximally, there was excellent uptake of the tracer.  The rectum  was also well perfused in appearance.  There was a visible pulse in the mesentery out to the level of the cleared colon at the level of the proximal sigmoid/descending colon junction.  This colon is also supple and healthy in appearance without any thickening.  This reached into the pelvis without any difficulty and remained in that location without any tension. A locking grasper was then placed on the sigmoid staple line.   Attention was turned to the extracorporeal portion of the procedure.  The robot was undocked.  I scrubbed back in.  Using the 12 mm trocar site, a Pfannenstiel incision was created and incorporated the fascial opening through the 12 mm port site.  The rectus fascia was incised and then elevated.  The rectus muscle was mobilized free of the overlying fascia.  The peritoneum was incised in the midline well above the location of the bladder.  An Alexis wound protector was placed.  Towels were placed around the field.  The divided colon was passed through the wound protector.  The point of proximal division was identified and was again on a healthy segment of supple colon with a palpable pulse in the mesentery. This was pink in color.  A pursestring device was applied.  A 2-0 Prolene on a Keith needle was passed.  The colon was divided and passed off with the open end being proximal.  EEA sizers were then introduced and a 29 mm EEA selected.  Belt loops consisting of 3-0 silk were placed around the pursestring suture line.  The anvil was placed and the pursestring tied.  A small amount of fat was cleared from the planned anastomosis and no diverticula were apparent within this.  This was placed back into the abdomen and a cap placed over the wound protector port site.  Pneumoperitoneum was reestablished.  I then went below to pass the stapler.  Dr. Debby remained above.  EEA sizers were cautiously introduced via the anus and advanced under direct visualization.  The stapler was passed and the  spike deployed just anterior to the staple line.  The components were then mated.  Orientation was confirmed such that there is no twisting of the colon nor small bowel underneath the mesenteric defect. Care was taken to ensure no other structures were incorporated within this either.  The stapler was then closed, held, and fired. This was then removed. The donuts were inspected and noted to be complete.  The colon proximal to the anastomosis was then gently occluded. The pelvis was filled with sterile irrigation. Under direct visualization, I passed a flexible sigmoidoscope.  The anastomosis was under water.  With good distention of the anastomosis there was no air leak. The anastomosis pink in appearance.  This is located at 14 cm from the anal verge by flexible sigmoidoscopy.  It is hemostatic.  Additionally, looking from above, there is no tension on the colon or mesentery.  Sigmoidoscope was withdrawn.  Irrigation was evacuated from the pelvis.  The abdomen and pelvis are surveyed and noted to be completely hemostatic without any apparent injury.  Under direct visualization, all trochars are removed.  The Alexis wound protector was removed.  Gowns/gloves are changed and a fresh set of clean instruments utilized. Additional sterile drapes were placed around the  field.   The Pfannenstiel peritoneum was closed with a running 2-0 Vicryl suture.  The rectus fascia was then closed using 2 running #1 PDS sutures.  The fascia was then palpated and noted to be completely closed.  Additional anesthetic was infiltrated at the Pfannenstiel site.  Sponge, needle, and instrument counts were reported correct x2. 4-0 Monocryl subcuticular suture was used to close the skin of all incision sites.  Dermabond was placed over all incisions.  A honeycomb dressing placed over the Pfannenstiel as well.   She was then taken out of lithotomy, awakened from anesthesia, extubated, and transferred to a stretcher for transport to PACU  in satisfactory condition having tolerated the procedure well.

## 2024-06-18 NOTE — Anesthesia Procedure Notes (Signed)
 Procedure Name: Intubation Date/Time: 06/18/2024 12:19 PM  Performed by: Gladis Honey, CRNAPre-anesthesia Checklist: Patient identified, Emergency Drugs available, Suction available and Patient being monitored Patient Re-evaluated:Patient Re-evaluated prior to induction Oxygen Delivery Method: Circle System Utilized Preoxygenation: Pre-oxygenation with 100% oxygen Induction Type: IV induction Ventilation: Mask ventilation without difficulty Laryngoscope Size: Miller and 2 Grade View: Grade II Tube type: Oral Tube size: 7.0 mm Number of attempts: 1 Airway Equipment and Method: Stylet and Oral airway Placement Confirmation: ETT inserted through vocal cords under direct vision, positive ETCO2 and breath sounds checked- equal and bilateral Secured at: 21 cm Tube secured with: Tape Dental Injury: Teeth and Oropharynx as per pre-operative assessment

## 2024-06-18 NOTE — Discharge Instructions (Signed)

## 2024-06-19 ENCOUNTER — Encounter (HOSPITAL_COMMUNITY): Payer: Self-pay | Admitting: Surgery

## 2024-06-19 LAB — CBC
HCT: 33.1 % — ABNORMAL LOW (ref 36.0–46.0)
Hemoglobin: 11.1 g/dL — ABNORMAL LOW (ref 12.0–15.0)
MCH: 30.5 pg (ref 26.0–34.0)
MCHC: 33.5 g/dL (ref 30.0–36.0)
MCV: 90.9 fL (ref 80.0–100.0)
Platelets: 270 K/uL (ref 150–400)
RBC: 3.64 MIL/uL — ABNORMAL LOW (ref 3.87–5.11)
RDW: 12.8 % (ref 11.5–15.5)
WBC: 13.4 K/uL — ABNORMAL HIGH (ref 4.0–10.5)
nRBC: 0 % (ref 0.0–0.2)

## 2024-06-19 LAB — BASIC METABOLIC PANEL WITH GFR
Anion gap: 13 (ref 5–15)
BUN: 7 mg/dL — ABNORMAL LOW (ref 8–23)
CO2: 23 mmol/L (ref 22–32)
Calcium: 9.3 mg/dL (ref 8.9–10.3)
Chloride: 103 mmol/L (ref 98–111)
Creatinine, Ser: 0.47 mg/dL (ref 0.44–1.00)
GFR, Estimated: >60 mL/min (ref >60)
Glucose, Bld: 123 mg/dL — ABNORMAL HIGH (ref 70–99)
Potassium: 3.4 mmol/L — ABNORMAL LOW (ref 3.5–5.1)
Sodium: 139 mmol/L (ref 135–145)

## 2024-06-19 MED ORDER — TRAMADOL HCL 50 MG PO TABS
50.0000 mg | ORAL_TABLET | Freq: Four times a day (QID) | ORAL | Status: DC | PRN
  Administered 2024-06-19: 100 mg via ORAL
  Administered 2024-06-19: 50 mg via ORAL
  Administered 2024-06-20 (×2): 100 mg via ORAL
  Filled 2024-06-19 (×2): qty 2
  Filled 2024-06-19: qty 1
  Filled 2024-06-19: qty 2

## 2024-06-19 NOTE — Progress Notes (Signed)
 Foley discontinued per MD ordered, tolerated well.

## 2024-06-19 NOTE — Progress Notes (Signed)
 1 Day Post-Op Rob LAR Subjective: Having some mild soreness, crampy pain, had a loose BM this AM, ambulating  Objective: Vital signs in last 24 hours: Temp:  [97.5 F (36.4 C)-98.6 F (37 C)] 98.6 F (37 C) (09/20 0536) Pulse Rate:  [56-87] 71 (09/20 0536) Resp:  [12-19] 12 (09/20 0536) BP: (120-145)/(57-82) 120/69 (09/20 0536) SpO2:  [92 %-100 %] 94 % (09/20 0536) Weight:  [73.9 kg-77.3 kg] 77.3 kg (09/20 0536)   Intake/Output from previous day: 09/19 0701 - 09/20 0700 In: 2117.6 [P.O.:240; I.V.:1777.6; IV Piggyback:100] Out: 1455 [Urine:1425; Blood:30] Intake/Output this shift: No intake/output data recorded.   General appearance: alert and cooperative GI: soft, non-distended  Incision: no significant drainage  Lab Results:  Recent Labs    06/18/24 1409 06/19/24 0440  WBC 15.5* 13.4*  HGB 12.3 11.1*  HCT 38.1 33.1*  PLT 301 270   BMET Recent Labs    06/18/24 1409 06/19/24 0440  NA  --  139  K  --  3.4*  CL  --  103  CO2  --  23  GLUCOSE  --  123*  BUN  --  7*  CREATININE 0.84 0.47  CALCIUM   --  9.3   PT/INR No results for input(s): LABPROT, INR in the last 72 hours. ABG No results for input(s): PHART, HCO3 in the last 72 hours.  Invalid input(s): PCO2, PO2  MEDS, Scheduled  acetaminophen   1,000 mg Oral Q6H   [START ON 06/21/2024] allopurinol   100 mg Oral Once per day on Monday Wednesday Friday   alvimopan   12 mg Oral BID   diltiazem   240 mg Oral Daily   feeding supplement  237 mL Oral BID BM   heparin  injection (subcutaneous)  5,000 Units Subcutaneous Q8H   levothyroxine   150 mcg Oral QAC breakfast   liothyronine   5 mcg Oral QAC breakfast   loratadine   10 mg Oral Daily   potassium chloride   10 mEq Oral Daily   triamterene -hydrochlorothiazide   1 tablet Oral Daily    Studies/Results: No results found.  Assessment: s/p Procedure(s): RESECTION, RECTUM, LOW ANTERIOR, ROBOT-ASSISTED SIGMOIDOSCOPY, FLEXIBLE Patient Active Problem  List   Diagnosis Date Noted   S/P laparoscopic-assisted sigmoidectomy 06/18/2024   Acute diverticulitis 04/21/2024   Perforation of sigmoid colon due to diverticulitis 04/23/2023   Hypokalemia 04/23/2023   Hyperproteinemia 04/23/2023   Hyponatremia 04/23/2023   Metabolic acidosis, increased anion gap 04/23/2023   Sepsis (HCC) 01/09/2023   Diverticulitis 01/08/2023   Chronic gout involving toe of left foot without tophus 05/11/2021   Acquired absence of breast and nipple, bilateral 04/20/2019   Recurrent cancer of left breast (HCC) 11/13/2018   Neuropathy due to chemotherapeutic drug (HCC) 11/13/2018   Prediabetes 05/28/2018   OSA (obstructive sleep apnea) 08/20/2016   Adjustment disorder with mixed anxiety and depressed mood 08/20/2016   Genetic testing 07/07/2015   Family history of uterine cancer    Family history of bladder cancer    Fatty infiltration of liver 07/08/2014   Chemotherapy-induced nausea 06/10/2014   Malignant neoplasm of upper-inner quadrant of left breast in female, estrogen receptor negative (HCC) 02/11/2014   HLD (hyperlipidemia) 05/11/2010   PSORIASIS 05/11/2010   Nonspecific (abnormal) findings on radiological and other examination of body structure 05/11/2010   CHEST XRAY, ABNORMAL 05/11/2010   Hypothyroidism 05/10/2010   Essential hypertension 09/02/2007   Allergic rhinitis, cause unspecified 09/02/2007    Expected post op course  Plan: Advance diet as tolerated SL IVF's  Ambulate in hall  LOS: 1 day     .Bernarda JAYSON Ned, MD [facility], [place]    06/19/2024 8:09 AM

## 2024-06-20 LAB — BASIC METABOLIC PANEL WITH GFR
Anion gap: 12 (ref 5–15)
BUN: 12 mg/dL (ref 8–23)
CO2: 25 mmol/L (ref 22–32)
Calcium: 9.3 mg/dL (ref 8.9–10.3)
Chloride: 101 mmol/L (ref 98–111)
Creatinine, Ser: 0.57 mg/dL (ref 0.44–1.00)
GFR, Estimated: >60 mL/min (ref >60)
Glucose, Bld: 107 mg/dL — ABNORMAL HIGH (ref 70–99)
Potassium: 3.4 mmol/L — ABNORMAL LOW (ref 3.5–5.1)
Sodium: 139 mmol/L (ref 135–145)

## 2024-06-20 LAB — CBC
HCT: 35.7 % — ABNORMAL LOW (ref 36.0–46.0)
Hemoglobin: 11.5 g/dL — ABNORMAL LOW (ref 12.0–15.0)
MCH: 29.8 pg (ref 26.0–34.0)
MCHC: 32.2 g/dL (ref 30.0–36.0)
MCV: 92.5 fL (ref 80.0–100.0)
Platelets: 284 K/uL (ref 150–400)
RBC: 3.86 MIL/uL — ABNORMAL LOW (ref 3.87–5.11)
RDW: 13.3 % (ref 11.5–15.5)
WBC: 12.8 K/uL — ABNORMAL HIGH (ref 4.0–10.5)
nRBC: 0 % (ref 0.0–0.2)

## 2024-06-20 NOTE — Progress Notes (Signed)
 2 Days Post-Op Rob LAR Subjective: Having a bit more soreness this am, having bowel function, ambulating, not eating much  Objective: Vital signs in last 24 hours: Temp:  [98.1 F (36.7 C)-98.2 F (36.8 C)] 98.2 F (36.8 C) (09/21 0504) Pulse Rate:  [54-74] 54 (09/21 0504) Resp:  [12-20] 12 (09/21 0504) BP: (116-129)/(57-61) 123/57 (09/21 0504) SpO2:  [94 %-96 %] 94 % (09/21 0504) Weight:  [75.6 kg] 75.6 kg (09/21 0504)   Intake/Output from previous day: 09/20 0701 - 09/21 0700 In: 600 [P.O.:600] Out: 100 [Urine:100] Intake/Output this shift: No intake/output data recorded.   General appearance: alert and cooperative GI: soft, non-distended  Incision: no significant drainage  Lab Results:  Recent Labs    06/19/24 0440 06/20/24 0451  WBC 13.4* 12.8*  HGB 11.1* 11.5*  HCT 33.1* 35.7*  PLT 270 284   BMET Recent Labs    06/19/24 0440 06/20/24 0451  NA 139 139  K 3.4* 3.4*  CL 103 101  CO2 23 25  GLUCOSE 123* 107*  BUN 7* 12  CREATININE 0.47 0.57  CALCIUM  9.3 9.3   PT/INR No results for input(s): LABPROT, INR in the last 72 hours. ABG No results for input(s): PHART, HCO3 in the last 72 hours.  Invalid input(s): PCO2, PO2  MEDS, Scheduled  acetaminophen   1,000 mg Oral Q6H   [START ON 06/21/2024] allopurinol   100 mg Oral Once per day on Monday Wednesday Friday   alvimopan   12 mg Oral BID   diltiazem   240 mg Oral Daily   feeding supplement  237 mL Oral BID BM   heparin  injection (subcutaneous)  5,000 Units Subcutaneous Q8H   levothyroxine   150 mcg Oral QAC breakfast   liothyronine   5 mcg Oral QAC breakfast   loratadine   10 mg Oral Daily   potassium chloride   10 mEq Oral Daily   triamterene -hydrochlorothiazide   1 tablet Oral Daily    Studies/Results: No results found.  Assessment: s/p Procedure(s): RESECTION, RECTUM, LOW ANTERIOR, ROBOT-ASSISTED SIGMOIDOSCOPY, FLEXIBLE Patient Active Problem List   Diagnosis Date Noted   S/P  laparoscopic-assisted sigmoidectomy 06/18/2024   Acute diverticulitis 04/21/2024   Perforation of sigmoid colon due to diverticulitis 04/23/2023   Hypokalemia 04/23/2023   Hyperproteinemia 04/23/2023   Hyponatremia 04/23/2023   Metabolic acidosis, increased anion gap 04/23/2023   Sepsis (HCC) 01/09/2023   Diverticulitis 01/08/2023   Chronic gout involving toe of left foot without tophus 05/11/2021   Acquired absence of breast and nipple, bilateral 04/20/2019   Recurrent cancer of left breast (HCC) 11/13/2018   Neuropathy due to chemotherapeutic drug (HCC) 11/13/2018   Prediabetes 05/28/2018   OSA (obstructive sleep apnea) 08/20/2016   Adjustment disorder with mixed anxiety and depressed mood 08/20/2016   Genetic testing 07/07/2015   Family history of uterine cancer    Family history of bladder cancer    Fatty infiltration of liver 07/08/2014   Chemotherapy-induced nausea 06/10/2014   Malignant neoplasm of upper-inner quadrant of left breast in female, estrogen receptor negative (HCC) 02/11/2014   HLD (hyperlipidemia) 05/11/2010   PSORIASIS 05/11/2010   Nonspecific (abnormal) findings on radiological and other examination of body structure 05/11/2010   CHEST XRAY, ABNORMAL 05/11/2010   Hypothyroidism 05/10/2010   Essential hypertension 09/02/2007   Allergic rhinitis, cause unspecified 09/02/2007    Expected post op course  Plan: Cont soft diet PO pain meds Ambulate in hall   LOS: 2 days     .Joahan Swatzell C Jahred Tatar, MD [facility], [place]  06/20/2024 8:11 AM

## 2024-06-20 NOTE — Plan of Care (Signed)

## 2024-06-21 ENCOUNTER — Other Ambulatory Visit: Payer: Self-pay

## 2024-06-21 LAB — CBC
HCT: 35.5 % — ABNORMAL LOW (ref 36.0–46.0)
Hemoglobin: 11.4 g/dL — ABNORMAL LOW (ref 12.0–15.0)
MCH: 30.1 pg (ref 26.0–34.0)
MCHC: 32.1 g/dL (ref 30.0–36.0)
MCV: 93.7 fL (ref 80.0–100.0)
Platelets: 273 K/uL (ref 150–400)
RBC: 3.79 MIL/uL — ABNORMAL LOW (ref 3.87–5.11)
RDW: 13.3 % (ref 11.5–15.5)
WBC: 9.6 K/uL (ref 4.0–10.5)
nRBC: 0 % (ref 0.0–0.2)

## 2024-06-21 LAB — BASIC METABOLIC PANEL WITH GFR
Anion gap: 13 (ref 5–15)
BUN: 13 mg/dL (ref 8–23)
CO2: 27 mmol/L (ref 22–32)
Calcium: 9.4 mg/dL (ref 8.9–10.3)
Chloride: 100 mmol/L (ref 98–111)
Creatinine, Ser: 0.63 mg/dL (ref 0.44–1.00)
GFR, Estimated: >60 mL/min (ref >60)
Glucose, Bld: 94 mg/dL (ref 70–99)
Potassium: 3.3 mmol/L — ABNORMAL LOW (ref 3.5–5.1)
Sodium: 139 mmol/L (ref 135–145)

## 2024-06-21 LAB — SURGICAL PATHOLOGY

## 2024-06-21 NOTE — Progress Notes (Signed)
 Patient discharged home, IV removed, discharge paperwork provided and explained to patient and patient's husband, both patient and patient's husband verbalized understanding.

## 2024-06-21 NOTE — TOC Transition Note (Signed)
 Transition of Care [facility]) - Discharge Note   Patient Details  Name: MONIK LINS MRN: [id number] Date of Birth: July 19, 1961  [facility] CM/SW Contact:  Alfonse JONELLE Rex, RN Phone Number: 06/21/2024, 9:29 AM   Clinical Narrative:   Patient discharged home priot to IP Care Management assessment. No TOC consults/Needs.           Patient Goals and CMS Choice            Discharge Placement                       Discharge Plan and Services Additional resources added to the After Visit Summary for                                       Social Drivers of Health (SDOH) Interventions SDOH Screenings   Food Insecurity: No Food Insecurity (06/19/2024)  Housing: Low Risk  (06/19/2024)  Transportation Needs: No Transportation Needs (06/19/2024)  Utilities: Not At Risk (06/19/2024)  Alcohol Screen: Low Risk  (10/14/2023)  Depression (PHQ2-9): Low Risk  (10/14/2023)  Financial Resource Strain: Low Risk  (04/16/2024)  Physical Activity: Inactive (04/16/2024)  Social Connections: Moderately Integrated (06/19/2024)  Stress: No Stress Concern Present (04/16/2024)  Tobacco Use: Medium Risk (06/18/2024)  Health Literacy: Adequate Health Literacy (10/14/2023)     Readmission Risk Interventions    04/24/2023    1:37 PM  Readmission Risk Prevention Plan  Post Dischage Appt Complete  Medication Screening Complete  Transportation Screening Complete

## 2024-06-21 NOTE — Plan of Care (Signed)
 ?  Problem: Clinical Measurements: ?Goal: Ability to maintain clinical measurements within normal limits will improve ?Outcome: Progressing ?Goal: Will remain free from infection ?Outcome: Progressing ?Goal: Diagnostic test results will improve ?Outcome: Progressing ?  ?

## 2024-06-21 NOTE — Discharge Summary (Signed)
 Physician Discharge Summary  Patient ID: Alexandria Bradley MRN: [id number] DOB/AGE: [birth year] 63 y.o.  Admit date: 06/18/2024 Discharge date: 06/21/2024  Admission Diagnoses: Recurrent diverticulitis Discharge Diagnoses:  Principal Problem:   S/P laparoscopic-assisted sigmoidectomy Recurrent diverticulitis  Discharged Condition: good  Hospital Course: Patient was admitted to the med surg floor after surgery.  Diet was advanced as tolerated.  Patient began to have bowel function on postop day 1.  By postop day 3, she was tolerating a solid diet and pain was controlled with oral medications.  She was urinating without difficulty and ambulating without assistance.  Patient was felt to be in stable condition for discharge to home.   Consults: None  Significant Diagnostic Studies: labs: cbc, bmet  Treatments: IV hydration, analgesia: acetaminophen , and surgery: robotic sigmoidectomy  Discharge Exam: Blood pressure 136/65, pulse (!) 50, temperature 98.1 F (36.7 C), temperature source Oral, resp. rate 18, height 5' 5 (1.651 m), weight 75.5 kg, SpO2 95%. General appearance: alert and cooperative GI: soft, non-distended Incision/Wound: clean, dry, intact  Disposition: Discharge disposition: 01-Home or Self Care        Allergies as of 06/21/2024       Reactions   Chlorhexidine  Itching   Ciprofloxacin  Anxiety, Other (See Comments), Rash, Swelling   GI upset   Prednisone  Shortness Of Breath, Other (See Comments)   Jittery, jacks me up    Codeine Nausea Only   Cortisone Swelling, Other (See Comments)   hyper sensitive, jacks me up   Colchicine  Nausea Only        Medication List     TAKE these medications    acetaminophen  500 MG tablet Commonly known as: TYLENOL  Take 1,000 mg by mouth every 6 (six) hours as needed for mild pain (pain score 1-3) or moderate pain (pain score 4-6).   albuterol  108 (90 Base) MCG/ACT inhaler Commonly known as: VENTOLIN  HFA Inhale  2 puffs into the lungs every 6 (six) hours as needed for wheezing or shortness of breath (Cough).   allopurinol  100 MG tablet Commonly known as: ZYLOPRIM  TAKE 1 TABLET BY MOUTH EVERY DAY What changed: when to take this   azelastine  0.1 % nasal spray Commonly known as: ASTELIN  Place 1-2 sprays into both nostrils daily as needed for rhinitis or allergies.   dicyclomine  10 MG capsule Commonly known as: BENTYL  Take 1 capsule (10 mg total) by mouth every 8 (eight) hours as needed for spasms.   diltiazem  240 MG 24 hr capsule Commonly known as: CARDIZEM  CD TAKE 1 CAPSULE BY MOUTH EVERY DAY   fexofenadine 180 MG tablet Commonly known as: ALLEGRA Take 180 mg by mouth daily.   liothyronine  5 MCG tablet Commonly known as: CYTOMEL  Take 5 mcg by mouth daily before breakfast.   mometasone  50 MCG/ACT nasal spray Commonly known as: NASONEX  Place 1-2 sprays into the nose daily as needed (allergies).   ondansetron  4 MG disintegrating tablet Commonly known as: ZOFRAN -ODT Take 1 tablet (4 mg total) by mouth every 8 (eight) hours as needed. What changed:  when to take this reasons to take this   potassium chloride  10 MEQ tablet Commonly known as: KLOR-CON  TAKE 1 TABLET BY MOUTH EVERY DAY   Tirosint  150 MCG Caps Generic drug: Levothyroxine  Sodium Take 150 mcg by mouth daily before breakfast.   traMADol  50 MG tablet Commonly known as: Ultram  Take 1 tablet (50 mg total) by mouth every 6 (six) hours as needed for up to 5 days (Postop pain not controlled Tylenol  and ibuprofen  first).  triamcinolone  cream 0.1 % Commonly known as: KENALOG  Apply 1 Application topically daily as needed (psoriasis).   triamterene -hydrochlorothiazide  37.5-25 MG tablet Commonly known as: MAXZIDE -25 Take 1 tablet by mouth daily.        Follow-up Information     [facility]. Schedule an appointment as soon as possible for a visit.   Specialty: Gastroenterology Why: To review  fatty liver disease with some evidence of cirrhosis. Contact information: [address] [place]  [address] [phone]        [name], MD Follow up on 07/12/2024.   Specialties: General Surgery, Colon and Rectal Surgery Why: Please arrive by 8:40 am Contact information: [address] [place] [address] [place] [address] [phone]                 Signed: Bernarda JAYSON Ned 06/21/2024, 8:35 AM

## 2024-07-02 ENCOUNTER — Ambulatory Visit: Payer: Self-pay | Admitting: Surgery

## 2024-07-19 ENCOUNTER — Telehealth: Payer: Self-pay | Admitting: Hematology and Oncology

## 2024-07-19 NOTE — Telephone Encounter (Signed)
 Left voicemail for patient regarding rescheduled appointment from 08/16/1014 to 08/18/2024.

## 2024-07-27 ENCOUNTER — Ambulatory Visit: Admitting: Family Medicine

## 2024-08-13 ENCOUNTER — Inpatient Hospital Stay: Admitting: Hematology and Oncology

## 2024-08-16 ENCOUNTER — Ambulatory Visit: Payer: BC Managed Care – PPO | Admitting: Hematology and Oncology

## 2024-08-17 ENCOUNTER — Telehealth: Payer: Self-pay

## 2024-08-17 NOTE — Telephone Encounter (Signed)
 Spoke with patient and confirmed appointment on 11/19

## 2024-08-18 ENCOUNTER — Inpatient Hospital Stay: Payer: Self-pay | Attending: Hematology and Oncology | Admitting: Hematology and Oncology

## 2024-08-18 VITALS — BP 148/65 | HR 69 | Temp 98.2°F | Resp 17 | Wt 168.4 lb

## 2024-08-18 DIAGNOSIS — C50212 Malignant neoplasm of upper-inner quadrant of left female breast: Secondary | ICD-10-CM

## 2024-08-18 DIAGNOSIS — Z171 Estrogen receptor negative status [ER-]: Secondary | ICD-10-CM

## 2024-08-18 NOTE — Progress Notes (Signed)
 [facility] Cancer Follow up:    Alexandria Mabel Mt, DO [address] [place] [address]   DIAGNOSIS: triple negative breast cancer, locally recurrent (s/p bilateral mastectomies)  SUMMARY OF ONCOLOGIC HISTORY: Oncology History  Malignant neoplasm of upper-inner quadrant of left breast in female, estrogen receptor negative (HCC)  02/11/2014 Initial Diagnosis   Malignant neoplasm of upper-inner quadrant of left breast in female, estrogen receptor negative (HCC)   11/26/2018 - 03/18/2019 Chemotherapy   The patient had dexamethasone  (DECADRON ) 4 MG tablet, 8 mg, Oral, Daily, 1 of 1 cycle, Start date: 11/13/2018, End date: 01/28/2019 palonosetron  (ALOXI ) injection 0.25 mg, 0.25 mg, Intravenous,  Once, 6 of 6 cycles Administration: 0.25 mg (11/26/2018), 0.25 mg (12/03/2018), 0.25 mg (03/11/2019), 0.25 mg (03/18/2019), 0.25 mg (01/07/2019), 0.25 mg (12/17/2018), 0.25 mg (12/24/2018), 0.25 mg (01/14/2019), 0.25 mg (01/28/2019), 0.25 mg (02/04/2019), 0.25 mg (02/18/2019), 0.25 mg (02/25/2019) gemcitabine  (GEMZAR ) 2,000 mg in sodium chloride  0.9 % 250 mL chemo infusion, 2,000 mg, Intravenous,  Once, 1 of 1 cycle Administration: 2,000 mg (02/18/2019) CARBOplatin  (PARAPLATIN ) 250 mg in sodium chloride  0.9 % 250 mL chemo infusion, 250 mg (100 % of original dose 253.2 mg), Intravenous,  Once, 6 of 6 cycles Dose modification:   (original dose 253.2 mg, Cycle 1) Administration: 250 mg (11/26/2018), 250 mg (12/03/2018), 250 mg (03/11/2019), 250 mg (03/18/2019), 250 mg (01/07/2019), 250 mg (12/17/2018), 250 mg (12/24/2018), 250 mg (01/14/2019), 250 mg (01/28/2019), 250 mg (02/04/2019), 250 mg (02/18/2019), 250 mg (02/25/2019) gemcitabine  (GEMZAR ) 1,976 mg in sodium chloride  0.9 % 250 mL chemo infusion, 1,000 mg/m2 = 1,976 mg, Intravenous,  Once, 6 of 6 cycles Administration: 1,976 mg (11/26/2018), 2,000 mg (12/03/2018), 2,000 mg (03/11/2019), 2,000 mg (03/18/2019), 2,000 mg (01/07/2019), 2,000 mg (12/17/2018),  2,000 mg (12/24/2018), 2,000 mg (01/14/2019), 2,000 mg (01/28/2019), 2,000 mg (02/04/2019), 2,000 mg (02/25/2019)  for chemotherapy treatment.    06/09/2019 - 09/22/2019 Chemotherapy   The patient had pembrolizumab  (KEYTRUDA ) 200 mg in sodium chloride  0.9 % 50 mL chemo infusion, 200 mg, Intravenous, Once, 6 of 6 cycles Administration: 200 mg (06/09/2019), 200 mg (06/30/2019), 200 mg (07/21/2019), 200 mg (08/11/2019), 200 mg (09/01/2019), 200 mg (09/22/2019)  for chemotherapy treatment.    Recurrent cancer of left breast (HCC)  11/13/2018 Initial Diagnosis   Recurrent cancer of left breast (HCC)   11/26/2018 - 03/18/2019 Chemotherapy   The patient had dexamethasone  (DECADRON ) 4 MG tablet, 8 mg, Oral, Daily, 1 of 1 cycle, Start date: 11/13/2018, End date: 01/28/2019 palonosetron  (ALOXI ) injection 0.25 mg, 0.25 mg, Intravenous,  Once, 6 of 6 cycles Administration: 0.25 mg (11/26/2018), 0.25 mg (12/03/2018), 0.25 mg (03/11/2019), 0.25 mg (03/18/2019), 0.25 mg (01/07/2019), 0.25 mg (12/17/2018), 0.25 mg (12/24/2018), 0.25 mg (01/14/2019), 0.25 mg (01/28/2019), 0.25 mg (02/04/2019), 0.25 mg (02/18/2019), 0.25 mg (02/25/2019) gemcitabine  (GEMZAR ) 2,000 mg in sodium chloride  0.9 % 250 mL chemo infusion, 2,000 mg, Intravenous,  Once, 1 of 1 cycle Administration: 2,000 mg (02/18/2019) CARBOplatin  (PARAPLATIN ) 250 mg in sodium chloride  0.9 % 250 mL chemo infusion, 250 mg (100 % of original dose 253.2 mg), Intravenous,  Once, 6 of 6 cycles Dose modification:   (original dose 253.2 mg, Cycle 1) Administration: 250 mg (11/26/2018), 250 mg (12/03/2018), 250 mg (03/11/2019), 250 mg (03/18/2019), 250 mg (01/07/2019), 250 mg (12/17/2018), 250 mg (12/24/2018), 250 mg (01/14/2019), 250 mg (01/28/2019), 250 mg (02/04/2019), 250 mg (02/18/2019), 250 mg (02/25/2019) gemcitabine  (GEMZAR ) 1,976 mg in sodium chloride  0.9 % 250 mL chemo infusion, 1,000 mg/m2 =  1,976 mg, Intravenous,  Once, 6 of 6 cycles Administration: 1,976 mg (11/26/2018), 2,000 mg (12/03/2018), 2,000  mg (03/11/2019), 2,000 mg (03/18/2019), 2,000 mg (01/07/2019), 2,000 mg (12/17/2018), 2,000 mg (12/24/2018), 2,000 mg (01/14/2019), 2,000 mg (01/28/2019), 2,000 mg (02/04/2019), 2,000 mg (02/25/2019)  for chemotherapy treatment.      CURRENT THERAPY: Observation  INTERVAL HISTORY:  Alexandria Bradley 63 y.o. female returns for evaluation of her h/o locally recurrent triple negative breast cancer.    Discussed the use of AI scribe software for clinical note transcription with the patient, who gave verbal consent to proceed.  History of Present Illness Alexandria Bradley is a 63 year old female with a history of diverticulitis and sigmoid resection who presents for follow-up after surgery.  She underwent a sigmoid resection in September due to recurrent episodes of diverticulitis, which led to frequent emergency room visits and multiple courses of antibiotics that were difficult to tolerate. Prior to surgery, she significantly reduced her food intake out of fear of triggering another episode, resulting in a poor diet for about a year and a half. Since the surgery on September 19, she has been doing well and healing properly, although she still experiences fatigue and occasional shooting pains.  There are no changes in breathing, no unintentional weight loss, and no issues with urination. She has gained some weight back since the surgery as her diet has improved.  She has been experiencing neck pain and pain through the middle of her back since the surgery. She has been taking Aleve to manage this pain.  She describes pain under her arm and halfway down her arm, which she suspects might be related to lymph drainage issues. This pain is not constant and tends to occur when she is active.  She is five years out from her second cancer diagnosis as of June 2020 and is currently under surveillance. She has not experienced any falls, headaches, or double vision.    Rest of the pertinent 10 point ROS reviewed  and negative.  Patient Active Problem List   Diagnosis Date Noted   S/P laparoscopic-assisted sigmoidectomy 06/18/2024   Acute diverticulitis 04/21/2024   Perforation of sigmoid colon due to diverticulitis 04/23/2023   Hypokalemia 04/23/2023   Hyperproteinemia 04/23/2023   Hyponatremia 04/23/2023   Metabolic acidosis, increased anion gap 04/23/2023   Sepsis (HCC) 01/09/2023   Diverticulitis 01/08/2023   Chronic gout involving toe of left foot without tophus 05/11/2021   Acquired absence of breast and nipple, bilateral 04/20/2019   Recurrent cancer of left breast (HCC) 11/13/2018   Neuropathy due to chemotherapeutic drug 11/13/2018   Prediabetes 05/28/2018   OSA (obstructive sleep apnea) 08/20/2016   Adjustment disorder with mixed anxiety and depressed mood 08/20/2016   Genetic testing 07/07/2015   Family history of uterine cancer    Family history of bladder cancer    Fatty infiltration of liver 07/08/2014   Chemotherapy-induced nausea 06/10/2014   Generalized abdominal pain 06/10/2014   Malignant neoplasm of upper-inner quadrant of left breast in female, estrogen receptor negative (HCC) 02/11/2014   HLD (hyperlipidemia) 05/11/2010   PSORIASIS 05/11/2010   Nonspecific (abnormal) findings on radiological and other examination of body structure 05/11/2010   CHEST XRAY, ABNORMAL 05/11/2010   Hypothyroidism 05/10/2010   Essential hypertension 09/02/2007   Allergic rhinitis, cause unspecified 09/02/2007    is allergic to chlorhexidine , ciprofloxacin , prednisone , codeine, cortisone, and colchicine .  MEDICAL HISTORY: Past Medical History:  Diagnosis Date   Allergy    Anxiety  Arthritis    Back pain    Breast cancer (HCC)    left   Fatty liver    GERD (gastroesophageal reflux disease)    Gout    Headache(784.0)    PMH : migraines   History of kidney stones    Hypercholesterolemia    Hypertension    Hypothyroidism    Hashimoto's   Osteoporosis    Personal history of  chemotherapy    Personal history of radiation therapy    Pneumonia    PONV (postoperative nausea and vomiting)    difficult to wake up   Pre-diabetes    Psoriasis    PVC's (premature ventricular contractions)    Radiation 08/18/14-10/07/14   Left breast   Sleep apnea    no CPAP    SURGICAL HISTORY: Past Surgical History:  Procedure Laterality Date   BREAST BIOPSY Left 11/10/2018   BREAST LUMPECTOMY Left 2015   BREAST LUMPECTOMY WITH AXILLARY LYMPH NODE BIOPSY  02/2014   left   BREAST RECONSTRUCTION WITH PLACEMENT OF TISSUE EXPANDER AND ALLODERM Bilateral 04/12/2019   Procedure: BILATERAL BREAST RECONSTRUCTION WITH PLACEMENT OF TISSUE EXPANDERS; ALLODERM TO RIGHT CHEST;  Surgeon: Arelia Filippo, MD;  Location: [facility] OR;  Service: Plastics;  Laterality: Bilateral;   BREAST RECONSTRUCTION WITH PLACEMENT OF TISSUE EXPANDER AND ALLODERM Right 01/24/2020   Procedure: BREAST RECONSTRUCTION WITH PLACEMENT OF TISSUE EXPANDER AND ALLODERM;  Surgeon: Arelia Filippo, MD;  Location: [facility];  Service: Plastics;  Laterality: Right;   CARDIAC CATHETERIZATION     CHOLECYSTECTOMY     Laparoscopic   colon polyps     COLONOSCOPY N/A 06/27/2014   Procedure: COLONOSCOPY;  Surgeon: Jerrell KYM Sol, MD;  Location: [facility];  Service: Endoscopy;  Laterality: N/A;   COLONOSCOPY  02/2020   FLEXIBLE SIGMOIDOSCOPY N/A 06/18/2024   Procedure: KINGSTON SIDE;  Surgeon: [name], MD;  Location: [facility];  Service: General;  Laterality: N/A;  ICG PERFUSION   LATISSIMUS FLAP TO BREAST Left 04/12/2019   Procedure: LEFT LATISSIMUS DORSI FLAP TO LEFT CHEST;  Surgeon: Arelia Filippo, MD;  Location: [facility];  Service: Plastics;  Laterality: Left;   LEFT HEART CATH AND CORONARY ANGIOGRAPHY N/A 07/24/2018   Procedure: LEFT HEART CATH AND CORONARY ANGIOGRAPHY;  Surgeon: Burnard Debby LABOR, MD;  Location: [facility];  Service: Cardiovascular;  Laterality: N/A;   LIPOMA  EXCISION     upper right back / shoulder   LIPOSUCTION WITH LIPOFILLING Bilateral 06/12/2020   Procedure: LIPOFILLING TO BILATERAL CHEST;  Surgeon: Arelia Filippo, MD;  Location: [facility];  Service: Plastics;  Laterality: Bilateral;   MASTECTOMY W/ SENTINEL NODE BIOPSY Bilateral 04/12/2019   Procedure: RIGHT BREAST RISK REDUCING MASTECTOMY; LEFT MASTECTOMY WITH LEFT AXILLARY SENTINEL LYMPH NODE BIOPSY WITH BLUE DYE INJECTION;  Surgeon: Ebbie Cough, MD;  Location: [facility];  Service: General;  Laterality: Bilateral;   MYOMECTOMY     PORT-A-CATH REMOVAL N/A 02/03/2015   Procedure: REMOVAL PORT-A-CATH;  Surgeon: Cough Ebbie, MD;  Location: [facility];  Service: General;  Laterality: N/A;   PORT-A-CATH REMOVAL Right 01/24/2020   Procedure: REMOVAL PORT-A-CATH;  Surgeon: Arelia Filippo, MD;  Location: [facility];  Service: Plastics;  Laterality: Right;   PORTACATH PLACEMENT N/A 03/01/2014   Procedure: INSERTION PORT-A-CATH;  Surgeon: Cough Ebbie, MD;  Location: [facility];  Service: General;  Laterality: N/A;   PORTACATH PLACEMENT N/A 11/18/2018   Procedure: INSERTION PORT-A-CATH WITH ULTRASOUND;  Surgeon:  Ebbie Cough, MD;  Location: [facility];  Service: General;  Laterality: N/A;   REMOVAL OF BILATERAL TISSUE EXPANDERS WITH PLACEMENT OF BILATERAL BREAST IMPLANTS Bilateral 06/12/2020   Procedure: REMOVAL OF BILATERAL TISSUE EXPANDERS WITH PLACEMENT OF BILATERAL BREAST IMPLANTS;  Surgeon: Arelia Filippo, MD;  Location: [facility];  Service: Plastics;  Laterality: Bilateral;   REMOVAL OF TISSUE EXPANDER AND PLACEMENT OF IMPLANT Right 06/02/2019   Procedure: REMOVAL OF TISSUE EXPANDER RIGHT CHEST;  Surgeon: Arelia Filippo, MD;  Location: [facility];  Service: Plastics;  Laterality: Right;   TONSILLECTOMY     TUBAL LIGATION     WOUND DEBRIDEMENT Left 06/02/2019   Procedure: LEFT  CHEST DEBRIDEMENT;  Surgeon: Arelia Filippo, MD;  Location: [facility];  Service: Plastics;  Laterality: Left;   XI ROBOTIC ASSISTED LOWER ANTERIOR RESECTION N/A 06/18/2024   Procedure: RESECTION, RECTUM, LOW ANTERIOR, ROBOT-ASSISTED;  Surgeon: [name], MD;  Location: [facility];  Service: General;  Laterality: N/A;    SOCIAL HISTORY: Social History   Socioeconomic History   Marital status: Married    Spouse name: Not on file   Number of children: 3   Years of education: Not on file   Highest education level: Some college, no degree  Occupational History   Not on file  Tobacco Use   Smoking status: Former    Current packs/day: 0.50    Average packs/day: 0.5 packs/day for 15.0 years (7.5 ttl pk-yrs)    Types: Cigarettes   Smokeless tobacco: Never   Tobacco comments:    Quit smoking cigarettes in 2002  Vaping Use   Vaping status: Never Used  Substance and Sexual Activity   Alcohol use: Not Currently   Drug use: No   Sexual activity: Yes    Birth control/protection: Post-menopausal  Other Topics Concern   Not on file  Social History Narrative   Not on file   Social Drivers of Health   Financial Resource Strain: Low Risk  (04/16/2024)   Overall Financial Resource Strain (CARDIA)    Difficulty of Paying Living Expenses: Not hard at all  Food Insecurity: No Food Insecurity (06/19/2024)   Hunger Vital Sign    Worried About Running Out of Food in the Last Year: Never true    Ran Out of Food in the Last Year: Never true  Transportation Needs: No Transportation Needs (06/19/2024)   PRAPARE - Administrator, Civil Service (Medical): No    Lack of Transportation (Non-Medical): No  Physical Activity: Inactive (04/16/2024)   Exercise Vital Sign    Days of Exercise per Week: 0 days    Minutes of Exercise per Session: Not on file  Stress: No Stress Concern Present (04/16/2024)   [employer] of Occupational Health - Occupational Stress  Questionnaire    Feeling of Stress: Only a little  Social Connections: Moderately Integrated (06/19/2024)   Social Connection and Isolation Panel    Frequency of Communication with Friends and Family: Once a week    Frequency of Social Gatherings with Friends and Family: Once a week    Attends Religious Services: More than 4 times per year    Active Member of [employer] or Organizations: Yes    Attends Banker Meetings: 1 to 4 times per year    Marital Status: Married  Catering Manager Violence: Not At Risk (06/19/2024)   Humiliation, Afraid, Rape, and Kick questionnaire    Fear of Current or Ex-Partner: No  Emotionally Abused: No    Physically Abused: No    Sexually Abused: No    FAMILY HISTORY: Family History  Problem Relation Age of Onset   Endometrial cancer Mother 35   Hearing loss Mother    Atrial fibrillation Mother    Lung cancer Father    Brain cancer Father    Barrett's esophagus Sister    Heart disease Maternal Aunt    Atrial fibrillation Maternal Aunt    Lung cancer Maternal Uncle    Atrial fibrillation Maternal Uncle    Bladder Cancer Maternal Uncle    Multiple myeloma Maternal Uncle    Stroke Maternal Grandmother    Stroke Maternal Grandfather    Other Son        trisomy 13   Colon polyps Cousin    Colon cancer Neg Hx    Esophageal cancer Neg Hx    Rectal cancer Neg Hx    Stomach cancer Neg Hx        PHYSICAL EXAMINATION  ECOG PERFORMANCE STATUS: 1 - Symptomatic but completely ambulatory  Vitals:   08/18/24 1517  BP: (!) 148/65  Pulse: 69  Resp: 17  Temp: 98.2 F (36.8 C)  SpO2: 97%    Physical Exam Constitutional:      General: She is not in acute distress.    Appearance: Normal appearance. She is not toxic-appearing.  HENT:     Head: Normocephalic and atraumatic.  Eyes:     General: No scleral icterus. Cardiovascular:     Rate and Rhythm: Normal rate and regular rhythm.     Pulses: Normal pulses.     Heart sounds: Normal  heart sounds.  Pulmonary:     Effort: Pulmonary effort is normal.     Breath sounds: Normal breath sounds.  Chest:     Comments: Status post bilateral mastectomies with reconstruction.  No signs of local recurrence.  No regional adenopathy.   Abdominal:     General: Abdomen is flat. Bowel sounds are normal. There is no distension.     Palpations: Abdomen is soft.     Tenderness: There is no abdominal tenderness.  Musculoskeletal:        General: No swelling.     Cervical back: Neck supple.  Lymphadenopathy:     Cervical: No cervical adenopathy.  Skin:    General: Skin is warm and dry.     Findings: No rash.  Neurological:     General: No focal deficit present.     Mental Status: She is alert.  Psychiatric:        Mood and Affect: Mood normal.        Behavior: Behavior normal.     LABORATORY DATA:  CBC    Component Value Date/Time   WBC 9.6 06/21/2024 0435   RBC 3.79 (L) 06/21/2024 0435   HGB 11.4 (L) 06/21/2024 0435   HGB 13.7 08/08/2022 0909   HGB 13.6 06/19/2017 1312   HCT 35.5 (L) 06/21/2024 0435   HCT 40.1 06/19/2017 1312   PLT 273 06/21/2024 0435   PLT 280 08/08/2022 0909   PLT 309 06/19/2017 1312   MCV 93.7 06/21/2024 0435   MCV 91.4 06/19/2017 1312   MCH 30.1 06/21/2024 0435   MCHC 32.1 06/21/2024 0435   RDW 13.3 06/21/2024 0435   RDW 13.5 06/19/2017 1312   LYMPHSABS 2.2 06/15/2024 1432   LYMPHSABS 2.0 06/19/2017 1312   MONOABS 0.8 06/15/2024 1432   MONOABS 0.6 06/19/2017 1312   EOSABS 0.2 06/15/2024  1432   EOSABS 0.2 06/19/2017 1312   BASOSABS 0.1 06/15/2024 1432   BASOSABS 0.1 06/19/2017 1312    CMP     Component Value Date/Time   NA 139 06/21/2024 0435   NA 138 01/09/2021 0855   NA 140 06/19/2017 1312   K 3.3 (L) 06/21/2024 0435   K 3.5 06/19/2017 1312   CL 100 06/21/2024 0435   CO2 27 06/21/2024 0435   CO2 25 06/19/2017 1312   GLUCOSE 94 06/21/2024 0435   GLUCOSE 82 06/19/2017 1312   GLUCOSE 88 09/22/2006 1056   BUN 13 06/21/2024 0435    BUN 13 01/09/2021 0855   BUN 18.4 06/19/2017 1312   CREATININE 0.63 06/21/2024 0435   CREATININE 0.83 08/08/2022 0909   CREATININE 0.8 06/19/2017 1312   CALCIUM  9.4 06/21/2024 0435   CALCIUM  10.2 06/19/2017 1312   PROT 8.0 06/15/2024 1432   PROT 7.2 03/15/2022 1004   PROT 7.7 06/19/2017 1312   ALBUMIN 4.7 06/15/2024 1432   ALBUMIN 4.6 03/15/2022 1004   ALBUMIN 4.4 06/19/2017 1312   AST 37 06/15/2024 1432   AST 17 08/08/2022 0909   AST 15 06/19/2017 1312   ALT 35 06/15/2024 1432   ALT 21 08/08/2022 0909   ALT 22 06/19/2017 1312   ALKPHOS 83 06/15/2024 1432   ALKPHOS 86 06/19/2017 1312   BILITOT 0.3 06/15/2024 1432   BILITOT 0.6 08/08/2022 0909   BILITOT 0.26 06/19/2017 1312   GFRNONAA >60 06/21/2024 0435   GFRNONAA >60 08/08/2022 0909   GFRAA >60 06/08/2020 1155   GFRAA >60 06/09/2019 1326    ASSESSMENT and THERAPY PLAN:    ASSESSMENT: 63 y.o. [place] woman status post left breast upper inner quadrant biopsy 02/08/2014 for a clinical T1c N0, stage IA invasive ductal carcinoma, grade 3, triple negative, with an MIB-1 of 50%.   (1) status post left lumpectomy and sentinel lymph node sampling 03/01/2014 for a pT1c pN0, stage IA invasive ductal carcinoma, grade 3, with negative margins, repeat prognostic panel again triple negative.   (2) adjuvant chemotherapy started a 03/25/2014 consisting of cyclophosphamide  and doxorubicin  given in dose dense fashion x4, completed 05/06/2014; followed by weekly paclitaxel  x1, on 05/27/2014, poorly tolerated;              (a) switched to Abraxane  starting 06/10/2014, with 11 Abraxane  doses planned             (b) dose reduced by 20% because of neutropenia starting 07/01/2014 dose             (c) discontinued after 4th Abraxane  dose 07/15/2014 due to neuropathy   (3) adjuvant radiation completed 10/07/2014.             Left breast with breath hold technique/ 45 Gy at 1.8 Gy per fraction x 25 fractions.               Left breast boost/  16 Gy at 2 Gy per fraction x 8 fractions   (4) genetics counseling 07/06/2015 through the Breast/Ovarian cancer gene panel offered by [facility] found no deleterious mutations in ATM, BARD1, BRCA1, BRCA2, BRIP1, CDH1, CHEK2, EPCAM, FANCC, MLH1, MSH2, MSH6, NBN, PALB2, PMS2, PTEN, RAD51C, RAD51D, TP53, and XRCC2.     (5) question of sleep apnea   (6) transverse colon lesion noted on CT scan from 06/17/2014 - negative biopsy, also showed fatty liver             (a) repeat CT scan of the abdomen and pelvis  with contrast 02/15/2016 negative   RECURRENT DISEASE: February 2020 (7) biopsy of palpable mass upper inner left breast 11/10/2018 confirms invasive ductal carcinoma, grade 2 or 3, triple negative, with an MIB-1 of 80%.             (a) CT scans of the chest abdomen and pelvis with contrast showed no stage IV disease             (b) bone scan 11/13/2018 shows no definite metastasis to bone   (8) neoadjuvant chemotherapy consisting of carboplatin  and gemcitabine  given days 1 and 8 of each 21-day cycle, for 6 cycles, started 11/26/2018, completed 03/18/2019             (a) repeat ultrasound 01/25/2019 (after 3 cycles) shows no change in measurable breast mass              (9) bilateral mastectomies as follows              (a) left mastectomy and sentinel lymph node sampling with immediate tissue expander placement and latissimus flap 04/12/2019 showed a residual ypT1a ypN0 invasive ductal carcinoma, grade 3, with negative margins; a single sentinel lymph node was removed.  Margins were negative             (b) right mastectomy with immediate implant placement showed no malignancy             (c) right expander removed 06/02/2019 secondary to infectious complications   (10) molecular testing on the 11/10/2018 biopsy showed negative PD-L1, negative androgen receptor, stable MSI and proficient mismatch repair status.  The tumor mutational burden was low.  No PIK3 mutation was detected.  There was a  pathogenic TP53 variant and negative ER PR and HER-2 were confirmed.   (11) Adjuvant pembrolizumab  given every 3 weeks beginning 06/09/2019, discontinued 09/22/2019 with rash   (12) Adjuvant capecitabine  1500mg  PO BID for 14 days on and 7 days off on a 21 day cycle starting 06/30/2019, discontinued January/2021 due to diarrhea     PLAN:  Assessment and Plan Assessment & Plan Surveillance for history of  breast cancer Five years post-surgery with no recurrence or new symptoms. - Continue annual surveillance - No role for mammograms, she is post bilateral mastectomy  Status post sigmoidectomy for diverticulitis with postoperative fatigue and pain Recovery progressing with postoperative fatigue and intermittent pain due to nerve regeneration. No significant complications. - Monitor postoperative symptoms. - Manage pain as needed.  Suspected left upper extremity lymphedema Intermittent pain possibly due to lymphedema or nerve involvement post-breast cancer surgery. Differential includes muscle strain versus lymphedema. - Consider physical therapy if symptoms persist or worsen. - Perform stretching and massage exercises.   Total time spent: 30 min including History, Physical exam, review of records, counseling and coordination of care. RTC in one yr or sooner as needed.  All questions were answered. The patient knows to call the clinic with any problems, questions or concerns. We can certainly see the patient much sooner if necessary.  *Total Encounter Time as defined by the Centers for Medicare and Medicaid Services includes, in addition to the face-to-face time of a patient visit (documented in the note above) non-face-to-face time: obtaining and reviewing outside history, ordering and reviewing medications, tests or procedures, care coordination (communications with other health care professionals or caregivers) and documentation in the medical record.

## 2024-08-21 ENCOUNTER — Other Ambulatory Visit: Payer: Self-pay | Admitting: Family Medicine

## 2024-08-30 NOTE — Progress Notes (Unsigned)
 Chief Complaint: Primary GI MD: Dr. Leigh  HPI:  *** is a  ***  who was referred to me by [name]* for a complaint of *** .    S/p rectosigmoid resection follow-up at 06/18/2024 for repeated episodes of diverticulitis. Findings: Fatty liver changes with some macronodular appearing cirrhosis. Chronic adhesions of the sigmoid colon the left lower quadrant related to prior diverticulitis. Numerous diverticula along the sigmoid colon. Thickening of the sigmoid colon and associated mesocolon. Healthy appearing descending colon and rectum.   Pathology showed A. RECTOSIGMOID, RESCTION:  - Portion of colon with diverticulitis  - Margins appear viable  - One benign lymph node   Follow-up with Dr. Teresa 06/2024. The patient was doing well postprocedure.  Discussed the use of AI scribe software for clinical note transcription with the patient, who gave verbal consent to proceed.  History of Present Illness      PREVIOUS GI WORKUP   CT AP 01/08/2023 1. Evidence of mild acute diverticulitis of the sigmoid colon. No evidence of perforation or diverticular abscess. 2. Few small bilateral subcentimeter renal cortical hypodensities too small to characterize, but likely cysts. These are unchanged as no further imaging follow-up is recommended. 3. Aortic atherosclerosis. 4. 3 mm nodule over the right middle lobe unchanged from 2022. No further follow-up recommended. 5. Aortic Atherosclerosis      Colonoscopy June 2021 - The examined portion of the ileum was normal.  - A single non-bleeding colonic angiodysplastic lesion.  - Mild diverticulosis in the left colon.  - Patchy mild erythema was seen in sigmoid colon and rectum. Biopsied.  - The examination was otherwise normal on direct and retroflexion views. Random biopsies obtained.  Path: lymphocytic colitis       CT abdomen pelvis 04/22/23: IMPRESSION: 1. Acute sigmoid colon diverticulitis with micro perforation.  No abscess.         Colonoscopy 06/10/2023: - The perianal and digital rectal examinations were normal. - The terminal ileum appeared normal. - A 7 mm polyp was found in the sigmoid colon. The polyp was sessile. The polyp was removed with a cold snare. Resection and retrieval were complete. - Multiple medium-mouthed and small-mouthed diverticula were found in the left colon. There was evidence of diverticular spasm. There was evidence of an impacted diverticulum. There was no evidence of diverticular bleeding. - The exam was otherwise without abnormality on direct and retroflexion views. Random biopsies obtained throughout.   FINAL DIAGNOSIS        1. Surgical [P], colon nos, random sites :       - UNREMARKABLE COLONIC MUCOSA.       - NEGATIVE FOR MICROSCOPIC COLITIS, DYSPLASIA, AND MALIGNANCY.        2. Surgical [P], colon, sigmoid, polyp (1) :       - POLYPOID FRAGMENT OF INFLAMED GRANULATION TISSUE/INFLAMMATORY POLYP.       - NEGATIVE FOR DYSPLASIA AND MALIGNANCY.      CT abdomen / pelvis 04/12/24: IMPRESSION: 1. Colonic diverticulosis with uncomplicated acute diverticulitis of the distal descending/proximal sigmoid colon. Recommend colonoscopy status post treatment and status post complete resolution of inflammatory changes to exclude an underlying lesion. 2.  Aortic Atherosclerosis (ICD10-I70.0).    Past Medical History:  Diagnosis Date   Allergy    Anxiety    Arthritis    Back pain    Breast cancer (HCC)    left   Fatty liver    GERD (gastroesophageal reflux disease)    Gout  Headache(784.0)    PMH : migraines   History of kidney stones    Hypercholesterolemia    Hypertension    Hypothyroidism    Hashimoto's   Osteoporosis    Personal history of chemotherapy    Personal history of radiation therapy    Pneumonia    PONV (postoperative nausea and vomiting)    difficult to wake up   Pre-diabetes    Psoriasis    PVC's (premature ventricular contractions)     Radiation 08/18/14-10/07/14   Left breast   Sleep apnea    no CPAP    Past Surgical History:  Procedure Laterality Date   BREAST BIOPSY Left 11/10/2018   BREAST LUMPECTOMY Left 2015   BREAST LUMPECTOMY WITH AXILLARY LYMPH NODE BIOPSY  02/2014   left   BREAST RECONSTRUCTION WITH PLACEMENT OF TISSUE EXPANDER AND ALLODERM Bilateral 04/12/2019   Procedure: BILATERAL BREAST RECONSTRUCTION WITH PLACEMENT OF TISSUE EXPANDERS; ALLODERM TO RIGHT CHEST;  Surgeon: Arelia Filippo, MD;  Location: [facility];  Service: Plastics;  Laterality: Bilateral;   BREAST RECONSTRUCTION WITH PLACEMENT OF TISSUE EXPANDER AND ALLODERM Right 01/24/2020   Procedure: BREAST RECONSTRUCTION WITH PLACEMENT OF TISSUE EXPANDER AND ALLODERM;  Surgeon: Arelia Filippo, MD;  Location: [facility];  Service: Plastics;  Laterality: Right;   CARDIAC CATHETERIZATION     CHOLECYSTECTOMY     Laparoscopic   colon polyps     COLONOSCOPY N/A 06/27/2014   Procedure: COLONOSCOPY;  Surgeon: Jerrell KYM Sol, MD;  Location: [facility];  Service: Endoscopy;  Laterality: N/A;   COLONOSCOPY  02/2020   FLEXIBLE SIGMOIDOSCOPY N/A 06/18/2024   Procedure: KINGSTON SIDE;  Surgeon: [name], MD;  Location: [facility];  Service: General;  Laterality: N/A;  ICG PERFUSION   LATISSIMUS FLAP TO BREAST Left 04/12/2019   Procedure: LEFT LATISSIMUS DORSI FLAP TO LEFT CHEST;  Surgeon: Arelia Filippo, MD;  Location: [facility];  Service: Plastics;  Laterality: Left;   LEFT HEART CATH AND CORONARY ANGIOGRAPHY N/A 07/24/2018   Procedure: LEFT HEART CATH AND CORONARY ANGIOGRAPHY;  Surgeon: Burnard Debby LABOR, MD;  Location: [facility];  Service: Cardiovascular;  Laterality: N/A;   LIPOMA EXCISION     upper right back / shoulder   LIPOSUCTION WITH LIPOFILLING Bilateral 06/12/2020   Procedure: LIPOFILLING TO BILATERAL CHEST;  Surgeon: Arelia Filippo, MD;  Location: [facility];  Service: Plastics;   Laterality: Bilateral;   MASTECTOMY W/ SENTINEL NODE BIOPSY Bilateral 04/12/2019   Procedure: RIGHT BREAST RISK REDUCING MASTECTOMY; LEFT MASTECTOMY WITH LEFT AXILLARY SENTINEL LYMPH NODE BIOPSY WITH BLUE DYE INJECTION;  Surgeon: Ebbie Cough, MD;  Location: [facility];  Service: General;  Laterality: Bilateral;   MYOMECTOMY     PORT-A-CATH REMOVAL N/A 02/03/2015   Procedure: REMOVAL PORT-A-CATH;  Surgeon: Cough Ebbie, MD;  Location: [facility];  Service: General;  Laterality: N/A;   PORT-A-CATH REMOVAL Right 01/24/2020   Procedure: REMOVAL PORT-A-CATH;  Surgeon: Arelia Filippo, MD;  Location: [facility];  Service: Plastics;  Laterality: Right;   PORTACATH PLACEMENT N/A 03/01/2014   Procedure: INSERTION PORT-A-CATH;  Surgeon: Cough Ebbie, MD;  Location:  SURGERY CENTER;  Service: General;  Laterality: N/A;   PORTACATH PLACEMENT N/A 11/18/2018   Procedure: INSERTION PORT-A-CATH WITH ULTRASOUND;  Surgeon: Ebbie Cough, MD;  Location: [facility];  Service: General;  Laterality: N/A;   REMOVAL OF BILATERAL TISSUE EXPANDERS WITH PLACEMENT OF BILATERAL BREAST IMPLANTS Bilateral 06/12/2020   Procedure: REMOVAL OF BILATERAL TISSUE EXPANDERS  WITH PLACEMENT OF BILATERAL BREAST IMPLANTS;  Surgeon: Arelia Filippo, MD;  Location: [facility];  Service: Plastics;  Laterality: Bilateral;   REMOVAL OF TISSUE EXPANDER AND PLACEMENT OF IMPLANT Right 06/02/2019   Procedure: REMOVAL OF TISSUE EXPANDER RIGHT CHEST;  Surgeon: Arelia Filippo, MD;  Location: [facility];  Service: Plastics;  Laterality: Right;   TONSILLECTOMY     TUBAL LIGATION     WOUND DEBRIDEMENT Left 06/02/2019   Procedure: LEFT CHEST DEBRIDEMENT;  Surgeon: Arelia Filippo, MD;  Location: [facility];  Service: Plastics;  Laterality: Left;   XI ROBOTIC ASSISTED LOWER ANTERIOR RESECTION N/A 06/18/2024   Procedure: RESECTION, RECTUM, LOW  ANTERIOR, ROBOT-ASSISTED;  Surgeon: [name], MD;  Location: [facility];  Service: General;  Laterality: N/A;    Current Outpatient Medications  Medication Sig Dispense Refill   acetaminophen  (TYLENOL ) 500 MG tablet Take 1,000 mg by mouth every 6 (six) hours as needed for mild pain (pain score 1-3) or moderate pain (pain score 4-6).     albuterol  (VENTOLIN  HFA) 108 (90 Base) MCG/ACT inhaler Inhale 2 puffs into the lungs every 6 (six) hours as needed for wheezing or shortness of breath (Cough). 18 g 0   allopurinol  (ZYLOPRIM ) 100 MG tablet TAKE 1 TABLET BY MOUTH EVERY DAY (Patient taking differently: Take 100 mg by mouth 3 (three) times a week.) 90 tablet 2   azelastine  (ASTELIN ) 0.1 % nasal spray Place 1-2 sprays into both nostrils daily as needed for rhinitis or allergies.     dicyclomine  (BENTYL ) 10 MG capsule Take 1 capsule (10 mg total) by mouth every 8 (eight) hours as needed for spasms.     diltiazem  (CARDIZEM  CD) 240 MG 24 hr capsule TAKE 1 CAPSULE BY MOUTH EVERY DAY 90 capsule 3   fexofenadine (ALLEGRA) 180 MG tablet Take 180 mg by mouth daily.     Levothyroxine  Sodium (TIROSINT ) 150 MCG CAPS Take 150 mcg by mouth daily before breakfast.     liothyronine  (CYTOMEL ) 5 MCG tablet Take 5 mcg by mouth daily before breakfast.      mometasone  (NASONEX ) 50 MCG/ACT nasal spray Place 1-2 sprays into the nose daily as needed (allergies).     ondansetron  (ZOFRAN -ODT) 4 MG disintegrating tablet Take 1 tablet (4 mg total) by mouth every 8 (eight) hours as needed. (Patient taking differently: Take 4 mg by mouth daily as needed for nausea or vomiting.) 20 tablet 0   potassium chloride  (KLOR-CON ) 10 MEQ tablet TAKE 1 TABLET BY MOUTH EVERY DAY 90 tablet 2   triamcinolone  cream (KENALOG ) 0.1 % Apply 1 Application topically daily as needed (psoriasis).     triamterene -hydrochlorothiazide  (MAXZIDE -25) 37.5-25 MG tablet Take 1 tablet by mouth daily. Needs appt 30 tablet 0   No current  facility-administered medications for this visit.    Allergies as of 08/31/2024 - Review Complete 06/18/2024  Allergen Reaction Noted   Chlorhexidine  Itching 02/03/2015   Ciprofloxacin  Anxiety, Other (See Comments), Rash, and Swelling 05/02/2014   Prednisone  Shortness Of Breath and Other (See Comments) 02/16/2014   Codeine Nausea Only 02/16/2014   Cortisone Swelling and Other (See Comments) 12/04/2007   Colchicine  Nausea Only 11/26/2021    Family History  Problem Relation Age of Onset   Endometrial cancer Mother 57   Hearing loss Mother    Atrial fibrillation Mother    Lung cancer Father    Brain cancer Father    Barrett's esophagus Sister    Heart disease Maternal Aunt  Atrial fibrillation Maternal Aunt    Lung cancer Maternal Uncle    Atrial fibrillation Maternal Uncle    Bladder Cancer Maternal Uncle    Multiple myeloma Maternal Uncle    Stroke Maternal Grandmother    Stroke Maternal Grandfather    Other Son        trisomy 13   Colon polyps Cousin    Colon cancer Neg Hx    Esophageal cancer Neg Hx    Rectal cancer Neg Hx    Stomach cancer Neg Hx     Social History   Socioeconomic History   Marital status: Married    Spouse name: Not on file   Number of children: 3   Years of education: Not on file   Highest education level: Some college, no degree  Occupational History   Not on file  Tobacco Use   Smoking status: Former    Current packs/day: 0.50    Average packs/day: 0.5 packs/day for 15.0 years (7.5 ttl pk-yrs)    Types: Cigarettes   Smokeless tobacco: Never   Tobacco comments:    Quit smoking cigarettes in 2002  Vaping Use   Vaping status: Never Used  Substance and Sexual Activity   Alcohol use: Not Currently   Drug use: No   Sexual activity: Yes    Birth control/protection: Post-menopausal  Other Topics Concern   Not on file  Social History Narrative   Not on file   Social Drivers of Health   Financial Resource Strain: Low Risk   (04/16/2024)   Overall Financial Resource Strain (CARDIA)    Difficulty of Paying Living Expenses: Not hard at all  Food Insecurity: No Food Insecurity (06/19/2024)   Hunger Vital Sign    Worried About Running Out of Food in the Last Year: Never true    Ran Out of Food in the Last Year: Never true  Transportation Needs: No Transportation Needs (06/19/2024)   PRAPARE - Administrator, Civil Service (Medical): No    Lack of Transportation (Non-Medical): No  Physical Activity: Inactive (04/16/2024)   Exercise Vital Sign    Days of Exercise per Week: 0 days    Minutes of Exercise per Session: Not on file  Stress: No Stress Concern Present (04/16/2024)   [employer] of Occupational Health - Occupational Stress Questionnaire    Feeling of Stress: Only a little  Social Connections: Moderately Integrated (06/19/2024)   Social Connection and Isolation Panel    Frequency of Communication with Friends and Family: Once a week    Frequency of Social Gatherings with Friends and Family: Once a week    Attends Religious Services: More than 4 times per year    Active Member of [employer] or Organizations: Yes    Attends Banker Meetings: 1 to 4 times per year    Marital Status: Married  Catering Manager Violence: Not At Risk (06/19/2024)   Humiliation, Afraid, Rape, and Kick questionnaire    Fear of Current or Ex-Partner: No    Emotionally Abused: No    Physically Abused: No    Sexually Abused: No    Review of Systems:    Constitutional: No weight loss, fever, chills, weakness or fatigue HEENT: Eyes: No change in vision               Ears, Nose, Throat:  No change in hearing or congestion Skin: No rash or itching Cardiovascular: No chest pain, chest pressure or palpitations   Respiratory: No SOB  or cough Gastrointestinal: See HPI and otherwise negative Genitourinary: No dysuria or change in urinary frequency Neurological: No headache, dizziness or syncope Musculoskeletal:  No new muscle or joint pain Hematologic: No bleeding or bruising Psychiatric: No history of depression or anxiety    Physical Exam:  Vital signs: There were no vitals taken for this visit.  Constitutional: NAD, alert and cooperative Head:  Normocephalic and atraumatic. Eyes:   PEERL, EOMI. No icterus. Conjunctiva pink. Respiratory: Respirations even and unlabored. Lungs clear to auscultation bilaterally.   No wheezes, crackles, or rhonchi.  Cardiovascular:  Regular rate and rhythm. No peripheral edema, cyanosis or pallor.  Gastrointestinal:  Soft, nondistended, nontender. No rebound or guarding. Normal bowel sounds. No appreciable masses or hepatomegaly. Rectal:  Declines Msk:  Symmetrical without gross deformities. Without edema, no deformity or joint abnormality.  Neurologic:  Alert and  oriented x4;  grossly normal neurologically.  Skin:   Dry and intact without significant lesions or rashes. Psychiatric: Oriented to person, place and time. Demonstrates good judgement and reason without abnormal affect or behaviors.  Physical Exam    RELEVANT LABS AND IMAGING: CBC    Component Value Date/Time   WBC 9.6 06/21/2024 0435   RBC 3.79 (L) 06/21/2024 0435   HGB 11.4 (L) 06/21/2024 0435   HGB 13.7 08/08/2022 0909   HGB 13.6 06/19/2017 1312   HCT 35.5 (L) 06/21/2024 0435   HCT 40.1 06/19/2017 1312   PLT 273 06/21/2024 0435   PLT 280 08/08/2022 0909   PLT 309 06/19/2017 1312   MCV 93.7 06/21/2024 0435   MCV 91.4 06/19/2017 1312   MCH 30.1 06/21/2024 0435   MCHC 32.1 06/21/2024 0435   RDW 13.3 06/21/2024 0435   RDW 13.5 06/19/2017 1312   LYMPHSABS 2.2 06/15/2024 1432   LYMPHSABS 2.0 06/19/2017 1312   MONOABS 0.8 06/15/2024 1432   MONOABS 0.6 06/19/2017 1312   EOSABS 0.2 06/15/2024 1432   EOSABS 0.2 06/19/2017 1312   BASOSABS 0.1 06/15/2024 1432   BASOSABS 0.1 06/19/2017 1312    CMP     Component Value Date/Time   NA 139 06/21/2024 0435   NA 138 01/09/2021 0855   NA  140 06/19/2017 1312   K 3.3 (L) 06/21/2024 0435   K 3.5 06/19/2017 1312   CL 100 06/21/2024 0435   CO2 27 06/21/2024 0435   CO2 25 06/19/2017 1312   GLUCOSE 94 06/21/2024 0435   GLUCOSE 82 06/19/2017 1312   GLUCOSE 88 09/22/2006 1056   BUN 13 06/21/2024 0435   BUN 13 01/09/2021 0855   BUN 18.4 06/19/2017 1312   CREATININE 0.63 06/21/2024 0435   CREATININE 0.83 08/08/2022 0909   CREATININE 0.8 06/19/2017 1312   CALCIUM  9.4 06/21/2024 0435   CALCIUM  10.2 06/19/2017 1312   PROT 8.0 06/15/2024 1432   PROT 7.2 03/15/2022 1004   PROT 7.7 06/19/2017 1312   ALBUMIN 4.7 06/15/2024 1432   ALBUMIN 4.6 03/15/2022 1004   ALBUMIN 4.4 06/19/2017 1312   AST 37 06/15/2024 1432   AST 17 08/08/2022 0909   AST 15 06/19/2017 1312   ALT 35 06/15/2024 1432   ALT 21 08/08/2022 0909   ALT 22 06/19/2017 1312   ALKPHOS 83 06/15/2024 1432   ALKPHOS 86 06/19/2017 1312   BILITOT 0.3 06/15/2024 1432   BILITOT 0.6 08/08/2022 0909   BILITOT 0.26 06/19/2017 1312   GFRNONAA >60 06/21/2024 0435   GFRNONAA >60 08/08/2022 0909   GFRAA >60 06/08/2020 1155   GFRAA >60 06/09/2019 1326  Assessment/Plan:   History of diverticulitis s/p robotic rectosigmoid resection 06/18/2024 Follows with Dr. Teresa.  Pathology showed left portion of colon with diverticulitis and 1 benign lymph node.  Findings during surgery incidentally showed fatty liver changes with some macronodular appearing cirrhosis  Fatty liver/Cirrhosis Findings during surgery showed fatty liver changes with some macronodular appearing cirrhosis. CTAPW contrast 03/2024 showed hepatic steatosis. Recent CMP/CBC unrevealing.   Colon cancer screening Colonoscopy 06/2023 with 7mm inflammatory polyp, diverticulosis, otherwise normal. Biopsies negative for microscopic colitis (positive on colon in 2021).  -- repeat 2034   Nestor Blower, PA-C [facility] 08/30/2024, 12:18 PM  Cc: [name]*

## 2024-08-31 ENCOUNTER — Other Ambulatory Visit

## 2024-08-31 ENCOUNTER — Ambulatory Visit: Admitting: Gastroenterology

## 2024-08-31 ENCOUNTER — Encounter: Payer: Self-pay | Admitting: Gastroenterology

## 2024-08-31 VITALS — BP 130/80 | HR 62 | Ht 63.5 in | Wt 167.1 lb

## 2024-08-31 DIAGNOSIS — K5792 Diverticulitis of intestine, part unspecified, without perforation or abscess without bleeding: Secondary | ICD-10-CM

## 2024-08-31 DIAGNOSIS — K760 Fatty (change of) liver, not elsewhere classified: Secondary | ICD-10-CM | POA: Diagnosis not present

## 2024-08-31 DIAGNOSIS — Z860109 Personal history of other colon polyps: Secondary | ICD-10-CM | POA: Diagnosis not present

## 2024-08-31 DIAGNOSIS — Z8719 Personal history of other diseases of the digestive system: Secondary | ICD-10-CM

## 2024-08-31 DIAGNOSIS — Z9049 Acquired absence of other specified parts of digestive tract: Secondary | ICD-10-CM

## 2024-08-31 LAB — COMPREHENSIVE METABOLIC PANEL WITH GFR
ALT: 27 U/L (ref 0–35)
AST: 20 U/L (ref 0–37)
Albumin: 4.9 g/dL (ref 3.5–5.2)
Alkaline Phosphatase: 84 U/L (ref 39–117)
BUN: 21 mg/dL (ref 6–23)
CO2: 29 meq/L (ref 19–32)
Calcium: 10.6 mg/dL — ABNORMAL HIGH (ref 8.4–10.5)
Chloride: 99 meq/L (ref 96–112)
Creatinine, Ser: 0.73 mg/dL (ref 0.40–1.20)
GFR: 86.14 mL/min (ref >60.00)
Glucose, Bld: 109 mg/dL — ABNORMAL HIGH (ref 70–99)
Potassium: 3.6 meq/L (ref 3.5–5.1)
Sodium: 139 meq/L (ref 135–145)
Total Bilirubin: 0.5 mg/dL (ref 0.2–1.2)
Total Protein: 7.7 g/dL (ref 6.0–8.3)

## 2024-08-31 LAB — CBC
HCT: 41.9 % (ref 36.0–46.0)
Hemoglobin: 14.4 g/dL (ref 12.0–15.0)
MCHC: 34.4 g/dL (ref 30.0–36.0)
MCV: 89.8 fl (ref 78.0–100.0)
Platelets: 295 K/uL (ref 150.0–400.0)
RBC: 4.67 Mil/uL (ref 3.87–5.11)
RDW: 13.4 % (ref 11.5–15.5)
WBC: 6.6 K/uL (ref 4.0–10.5)

## 2024-08-31 LAB — GAMMA GT: GGT: 34 U/L (ref 7–51)

## 2024-08-31 LAB — IBC + FERRITIN
Ferritin: 45.9 ng/mL (ref 10.0–291.0)
Iron: 106 ug/dL (ref 42–145)
Saturation Ratios: 25.5 % (ref 20.0–50.0)
TIBC: 415.8 ug/dL (ref 250.0–450.0)
Transferrin: 297 mg/dL (ref 212.0–360.0)

## 2024-08-31 LAB — PROTIME-INR
INR: 1.1 ratio — ABNORMAL HIGH (ref 0.8–1.0)
Prothrombin Time: 11.3 s (ref 9.6–13.1)

## 2024-08-31 NOTE — Patient Instructions (Signed)
 You have been scheduled for an abdominal ultrasound at [facility] (1st floor of hospital) on 09/07/2024 at 9:30am. Please arrive 15 minutes prior to your appointment for registration. Make certain not to have anything to eat or drink after midnight prior to your appointment. Should you need to reschedule your appointment, please contact radiology at [phone]. This test typically takes about 30 minutes to perform.   Please go to the lab in the basement of our building to have lab work done as you leave today. Hit B for basement when you get on the elevator.  When the doors open the lab is on your left.  We will call you with the results. Thank you.   Thank you for trusting me with your gastrointestinal care!   Nestor Blower, PA   _______________________________________________________  If your blood pressure at your visit was 140/90 or greater, please contact your primary care physician to follow up on this.  _______________________________________________________  If you are age 65 or older, your body mass index should be between 23-30. Your Body mass index is 29.14 kg/m. If this is out of the aforementioned range listed, please consider follow up with your Primary Care Provider.  If you are age 57 or younger, your body mass index should be between 19-25. Your Body mass index is 29.14 kg/m. If this is out of the aformentioned range listed, please consider follow up with your Primary Care Provider.   ________________________________________________________  The [facility] providers would like to encourage you to use MYCHART to communicate with providers for non-urgent requests or questions.  Due to long hold times on the telephone, sending your provider a message by [facility] may be a faster and more efficient way to get a response.  Please allow 48 business hours for a response.  Please remember that this is for non-urgent requests.   _______________________________________________________  [facility] is using a team-based approach to care.  Your team is made up of your doctor and two to three APPS. Our APPS (Nurse Practitioners and Physician Assistants) work with your physician to ensure care continuity for you. They are fully qualified to address your health concerns and develop a treatment plan. They communicate directly with your gastroenterologist to care for you. Seeing the Advanced Practice Practitioners on your physician's team can help you by facilitating care more promptly, often allowing for earlier appointments, access to diagnostic testing, procedures, and other specialty referrals.

## 2024-08-31 NOTE — Progress Notes (Signed)
 Agree with assessment and plan as outlined.  If she meets criteria for GLP-1 agonist based on elastography then we can proceed with prescribing it

## 2024-09-02 LAB — HEPATITIS B SURFACE ANTIGEN: Hepatitis B Surface Ag: NONREACTIVE

## 2024-09-02 LAB — ANTI-NUCLEAR AB-TITER (ANA TITER): ANA Titer 1: 1:80 {titer} — ABNORMAL HIGH

## 2024-09-02 LAB — HEPATITIS B SURFACE ANTIBODY,QUALITATIVE: Hep B S Ab: NONREACTIVE

## 2024-09-02 LAB — ALPHA-1-ANTITRYPSIN: A-1 Antitrypsin, Ser: 105 mg/dL (ref 83–199)

## 2024-09-02 LAB — HEPATITIS C ANTIBODY: Hepatitis C Ab: NONREACTIVE

## 2024-09-02 LAB — CERULOPLASMIN: Ceruloplasmin: 31 mg/dL (ref 14–48)

## 2024-09-02 LAB — HEPATITIS A ANTIBODY, TOTAL: Hepatitis A AB,Total: REACTIVE — AB

## 2024-09-02 LAB — IGA: Immunoglobulin A: 356 mg/dL — ABNORMAL HIGH (ref 70–320)

## 2024-09-02 LAB — ANA: Anti Nuclear Antibody (ANA): POSITIVE — AB

## 2024-09-02 LAB — ANTI-SMOOTH MUSCLE ANTIBODY, IGG: Actin (Smooth Muscle) Antibody (IGG): <20 U (ref <20)

## 2024-09-02 LAB — IGG: IgG (Immunoglobin G), Serum: 896 mg/dL (ref 600–1540)

## 2024-09-02 LAB — MITOCHONDRIAL ANTIBODIES: Mitochondrial M2 Ab, IgG: <=20 U (ref <=20.0)

## 2024-09-03 ENCOUNTER — Ambulatory Visit: Payer: Self-pay | Admitting: Gastroenterology

## 2024-09-07 ENCOUNTER — Ambulatory Visit (HOSPITAL_COMMUNITY)

## 2024-09-11 ENCOUNTER — Ambulatory Visit (HOSPITAL_COMMUNITY): Admission: RE | Admit: 2024-09-11 | Discharge: 2024-09-11 | Attending: Gastroenterology

## 2024-09-11 DIAGNOSIS — K760 Fatty (change of) liver, not elsewhere classified: Secondary | ICD-10-CM

## 2024-09-14 ENCOUNTER — Ambulatory Visit
Admission: RE | Admit: 2024-09-14 | Discharge: 2024-09-14 | Disposition: A | Discharge status: Home / Self Care | Source: Ambulatory Visit | Attending: Emergency Medicine | Admitting: Emergency Medicine

## 2024-09-14 VITALS — BP 157/93 | HR 81 | Temp 98.0°F | Resp 17

## 2024-09-14 DIAGNOSIS — R051 Acute cough: Secondary | ICD-10-CM

## 2024-09-14 DIAGNOSIS — J22 Unspecified acute lower respiratory infection: Secondary | ICD-10-CM

## 2024-09-14 MED ORDER — DOXYCYCLINE HYCLATE 100 MG PO CAPS: 100.0000 mg | ORAL_CAPSULE | Freq: Two times a day (BID) | ORAL | 0 refills | Status: AC

## 2024-09-14 MED ORDER — BENZONATATE 100 MG PO CAPS: 100.0000 mg | ORAL_CAPSULE | Freq: Three times a day (TID) | ORAL | 0 refills | Status: AC

## 2024-09-14 NOTE — ED Triage Notes (Signed)
 Pt c/o congestion, cough for over 1 week.  States when symptoms started she had low grade fever and sore throat.

## 2024-09-14 NOTE — Discharge Instructions (Signed)
 We are treating you for acute respiratory infection, take doxycycline  as prescribed, drink plenty of water, any antibiotic can upset your stomach, may take over-the-counter probiotics or yogurt with active cultures to help with this, take Tessalon  as prescribed for cough. Follow-up with your PCP If you develop chest pain, shortness of breath, or worsening symptoms go to the emergency room for further evaluation

## 2024-09-14 NOTE — ED Provider Notes (Signed)
 [facility]    CSN: [id number] Arrival date & time: 09/14/24  1609      History   Chief Complaint Chief Complaint  Patient presents with   Cough    Congestion  cough low grade fever for over week started with severe sore throat - Entered by patient    HPI Alexandria Bradley is a 63 y.o. female.   63-year female, Alexandria Bradley, presents to urgent care for evaluation of cough and congestion for almost 2 weeks.  Patient states when symptoms started she had a low-grade fever and sore throat. Pt has been taking throat lozenges,cough drops, , Dayquil, Nyquil, using humidifier without relief of cough, sore throat is improved.  Unknown illness exposure  Pmh: Pneumonia ,fatty liver, left-sided breast cancer, hypothyroidism, hypertension, hypercholesterolemia, GERD, allergies  The history is provided by the patient. No language interpreter was used.  Cough Associated symptoms: fever and sore throat   Associated symptoms: no chest pain, no shortness of breath and no wheezing     Past Medical History:  Diagnosis Date   Allergy    Anxiety    Arthritis    Back pain    Breast cancer (HCC)    left   Fatty liver    GERD (gastroesophageal reflux disease)    Gout    Headache(784.0)    PMH : migraines   History of kidney stones    Hypercholesterolemia    Hypertension    Hypothyroidism    Hashimoto's   Osteoporosis    Personal history of chemotherapy    Personal history of radiation therapy    Pneumonia    PONV (postoperative nausea and vomiting)    difficult to wake up   Pre-diabetes    Psoriasis    PVC's (premature ventricular contractions)    Radiation 08/18/14-10/07/14   Left breast   Sleep apnea    no CPAP    Patient Active Problem List   Diagnosis Date Noted   Acute cough 09/14/2024   S/P laparoscopic-assisted sigmoidectomy 06/18/2024   Acute diverticulitis 04/21/2024   Perforation of sigmoid colon due to diverticulitis 04/23/2023   Hypokalemia  04/23/2023   Hyperproteinemia 04/23/2023   Hyponatremia 04/23/2023   Metabolic acidosis, increased anion gap 04/23/2023   Sepsis (HCC) 01/09/2023   Diverticulitis 01/08/2023   Chronic gout involving toe of left foot without tophus 05/11/2021   Acquired absence of breast and nipple, bilateral 04/20/2019   Recurrent cancer of left breast (HCC) 11/13/2018   Neuropathy due to chemotherapeutic drug 11/13/2018   Prediabetes 05/28/2018   OSA (obstructive sleep apnea) 08/20/2016   Adjustment disorder with mixed anxiety and depressed mood 08/20/2016   Genetic testing 07/07/2015   Family history of uterine cancer    Family history of bladder cancer    Fatty infiltration of liver 07/08/2014   Chemotherapy-induced nausea 06/10/2014   Generalized abdominal pain 06/10/2014   Malignant neoplasm of upper-inner quadrant of left breast in female, estrogen receptor negative (HCC) 02/11/2014   HLD (hyperlipidemia) 05/11/2010   PSORIASIS 05/11/2010   Nonspecific (abnormal) findings on radiological and other examination of body structure 05/11/2010   CHEST XRAY, ABNORMAL 05/11/2010   Hypothyroidism 05/10/2010   Essential hypertension 09/02/2007   Acute respiratory infection 09/02/2007   Allergic rhinitis, cause unspecified 09/02/2007    Past Surgical History:  Procedure Laterality Date   BREAST BIOPSY Left 11/10/2018   BREAST LUMPECTOMY Left 2015   BREAST LUMPECTOMY WITH AXILLARY LYMPH NODE BIOPSY  02/2014   left   BREAST  RECONSTRUCTION WITH PLACEMENT OF TISSUE EXPANDER AND ALLODERM Bilateral 04/12/2019   Procedure: BILATERAL BREAST RECONSTRUCTION WITH PLACEMENT OF TISSUE EXPANDERS; ALLODERM TO RIGHT CHEST;  Surgeon: Arelia Filippo, MD;  Location: [facility];  Service: Plastics;  Laterality: Bilateral;   BREAST RECONSTRUCTION WITH PLACEMENT OF TISSUE EXPANDER AND ALLODERM Right 01/24/2020   Procedure: BREAST RECONSTRUCTION WITH PLACEMENT OF TISSUE EXPANDER AND ALLODERM;  Surgeon: Arelia Filippo,  MD;  Location: [facility];  Service: Plastics;  Laterality: Right;   CARDIAC CATHETERIZATION     CHOLECYSTECTOMY     Laparoscopic   colon polyps     COLONOSCOPY N/A 06/27/2014   Procedure: COLONOSCOPY;  Surgeon: Jerrell KYM Sol, MD;  Location: [facility];  Service: Endoscopy;  Laterality: N/A;   COLONOSCOPY  02/2020   FLEXIBLE SIGMOIDOSCOPY N/A 06/18/2024   Procedure: KINGSTON SIDE;  Surgeon: [name], MD;  Location: [facility];  Service: General;  Laterality: N/A;  ICG PERFUSION   LATISSIMUS FLAP TO BREAST Left 04/12/2019   Procedure: LEFT LATISSIMUS DORSI FLAP TO LEFT CHEST;  Surgeon: Arelia Filippo, MD;  Location: [facility];  Service: Plastics;  Laterality: Left;   LEFT HEART CATH AND CORONARY ANGIOGRAPHY N/A 07/24/2018   Procedure: LEFT HEART CATH AND CORONARY ANGIOGRAPHY;  Surgeon: Burnard Debby LABOR, MD;  Location: [facility];  Service: Cardiovascular;  Laterality: N/A;   LIPOMA EXCISION     upper right back / shoulder   LIPOSUCTION WITH LIPOFILLING Bilateral 06/12/2020   Procedure: LIPOFILLING TO BILATERAL CHEST;  Surgeon: Arelia Filippo, MD;  Location: [facility];  Service: Plastics;  Laterality: Bilateral;   MASTECTOMY W/ SENTINEL NODE BIOPSY Bilateral 04/12/2019   Procedure: RIGHT BREAST RISK REDUCING MASTECTOMY; LEFT MASTECTOMY WITH LEFT AXILLARY SENTINEL LYMPH NODE BIOPSY WITH BLUE DYE INJECTION;  Surgeon: Ebbie Cough, MD;  Location: [facility];  Service: General;  Laterality: Bilateral;   MYOMECTOMY     PORT-A-CATH REMOVAL N/A 02/03/2015   Procedure: REMOVAL PORT-A-CATH;  Surgeon: Cough Ebbie, MD;  Location: [facility];  Service: General;  Laterality: N/A;   PORT-A-CATH REMOVAL Right 01/24/2020   Procedure: REMOVAL PORT-A-CATH;  Surgeon: Arelia Filippo, MD;  Location: [facility];  Service: Plastics;  Laterality: Right;   PORTACATH PLACEMENT N/A 03/01/2014   Procedure: INSERTION  PORT-A-CATH;  Surgeon: Cough Ebbie, MD;  Location: [facility];  Service: General;  Laterality: N/A;   PORTACATH PLACEMENT N/A 11/18/2018   Procedure: INSERTION PORT-A-CATH WITH ULTRASOUND;  Surgeon: Ebbie Cough, MD;  Location: [facility];  Service: General;  Laterality: N/A;   REMOVAL OF BILATERAL TISSUE EXPANDERS WITH PLACEMENT OF BILATERAL BREAST IMPLANTS Bilateral 06/12/2020   Procedure: REMOVAL OF BILATERAL TISSUE EXPANDERS WITH PLACEMENT OF BILATERAL BREAST IMPLANTS;  Surgeon: Arelia Filippo, MD;  Location: [facility];  Service: Plastics;  Laterality: Bilateral;   REMOVAL OF TISSUE EXPANDER AND PLACEMENT OF IMPLANT Right 06/02/2019   Procedure: REMOVAL OF TISSUE EXPANDER RIGHT CHEST;  Surgeon: Arelia Filippo, MD;  Location: [facility];  Service: Plastics;  Laterality: Right;   TONSILLECTOMY     TUBAL LIGATION     WOUND DEBRIDEMENT Left 06/02/2019   Procedure: LEFT CHEST DEBRIDEMENT;  Surgeon: Arelia Filippo, MD;  Location: [facility];  Service: Plastics;  Laterality: Left;   XI ROBOTIC ASSISTED LOWER ANTERIOR RESECTION N/A 06/18/2024   Procedure: RESECTION, RECTUM, LOW ANTERIOR, ROBOT-ASSISTED;  Surgeon: [name], MD;  Location: [facility];  Service: General;  Laterality: N/A;  OB History   No obstetric history on file.      Home Medications    Prior to Admission medications  Medication Sig Start Date End Date Taking? Authorizing Provider  benzonatate  (TESSALON ) 100 MG capsule Take 1 capsule (100 mg total) by mouth every 8 (eight) hours. 09/14/24  Yes Maury Bamba, Rilla, NP  doxycycline  (VIBRAMYCIN ) 100 MG capsule Take 1 capsule (100 mg total) by mouth 2 (two) times daily for 7 days. 09/14/24 09/21/24 Yes Racquelle Hyser, NP  acetaminophen  (TYLENOL ) 500 MG tablet Take 1,000 mg by mouth every 6 (six) hours as needed for mild pain (pain score 1-3) or moderate pain (pain score 4-6).    [provider]  albuterol  (VENTOLIN  HFA) 108 (90 Base) MCG/ACT inhaler Inhale 2 puffs into the lungs every 6 (six) hours as needed for wheezing or shortness of breath (Cough). Patient not taking: Reported on 09/14/2024 09/24/22   Joesph Shaver Scales, PA-C  allopurinol  (ZYLOPRIM ) 100 MG tablet TAKE 1 TABLET BY MOUTH EVERY DAY Patient not taking: Reported on 08/31/2024 03/15/24   [name], DO  azelastine  (ASTELIN ) 0.1 % nasal spray Place 1-2 sprays into both nostrils daily as needed for rhinitis or allergies. 04/22/24   Hongalgi, Anand D, MD  dicyclomine  (BENTYL ) 10 MG capsule Take 1 capsule (10 mg total) by mouth every 8 (eight) hours as needed for spasms. Patient not taking: Reported on 08/31/2024 04/19/24   [name], MD  diltiazem  (CARDIZEM  CD) 240 MG 24 hr capsule TAKE 1 CAPSULE BY MOUTH EVERY DAY 03/15/24   [name], DO  fexofenadine (ALLEGRA) 180 MG tablet Take 180 mg by mouth daily.    [provider]  Levothyroxine  Sodium (TIROSINT ) 150 MCG CAPS Take 150 mcg by mouth daily before breakfast.    [provider]  liothyronine  (CYTOMEL ) 5 MCG tablet Take 5 mcg by mouth daily before breakfast.  11/13/13   [provider]  mometasone  (NASONEX ) 50 MCG/ACT nasal spray Place 1-2 sprays into the nose daily as needed (allergies). 04/22/24   Hongalgi, Anand D, MD  ondansetron  (ZOFRAN -ODT) 4 MG disintegrating tablet Take 1 tablet (4 mg total) by mouth every 8 (eight) hours as needed. Patient not taking: Reported on 08/31/2024 04/12/24   Haviland, Julie, MD  polyethylene glycol powder (GLYCOLAX /MIRALAX ) 17 GM/SCOOP powder Take 17 g by mouth as needed. 05/11/24   [provider]  potassium chloride  (KLOR-CON ) 10 MEQ tablet TAKE 1 TABLET BY MOUTH EVERY DAY 03/15/24   [name], DO  triamcinolone  cream (KENALOG ) 0.1 % Apply 1 Application topically daily as needed (psoriasis). 02/16/24   [provider]   triamterene -hydrochlorothiazide  (MAXZIDE -25) 37.5-25 MG tablet Take 1 tablet by mouth daily. Needs appt 08/23/24   [name], DO  prochlorperazine  (COMPAZINE ) 10 MG tablet Take 1 tablet (10 mg total) by mouth every 6 (six) hours as needed (Nausea or vomiting). 11/13/18 05/11/19  Magrinat, Sandria BROCKS, MD    Family History Family History  Problem Relation Age of Onset   Endometrial cancer Mother 14   Hearing loss Mother    Atrial fibrillation Mother    Lung cancer Father    Brain cancer Father    Barrett's esophagus Sister    Heart disease Maternal Aunt    Atrial fibrillation Maternal Aunt    Lung cancer Maternal Uncle    Atrial fibrillation Maternal Uncle    Bladder Cancer Maternal Uncle    Multiple myeloma Maternal Uncle    Stroke Maternal Grandmother  Stroke Maternal Grandfather    Other Son        trisomy 34   Colon polyps Cousin    Colon cancer Neg Hx    Esophageal cancer Neg Hx    Rectal cancer Neg Hx    Stomach cancer Neg Hx     Social History Social History[1]   Allergies   Chlorhexidine , Ciprofloxacin , Prednisone , Codeine, Cortisone, and Colchicine    Review of Systems Review of Systems  Constitutional:  Positive for fever.  HENT:  Positive for congestion and sore throat.   Respiratory:  Positive for cough. Negative for shortness of breath, wheezing and stridor.   Cardiovascular:  Negative for chest pain and palpitations.  All other systems reviewed and are negative.    Physical Exam Triage Vital Signs ED Triage Vitals  Encounter Vitals Group     BP 09/14/24 1639 (!) 157/93     Girls Systolic BP Percentile --      Girls Diastolic BP Percentile --      Boys Systolic BP Percentile --      Boys Diastolic BP Percentile --      Pulse Rate 09/14/24 1639 81     Resp 09/14/24 1639 17     Temp 09/14/24 1639 98 F (36.7 C)     Temp Source 09/14/24 1639 Oral     SpO2 09/14/24 1639 95 %     Weight --      Height --      Head Circumference --       Peak Flow --      Pain Score 09/14/24 1642 0     Pain Loc --      Pain Education --      Exclude from Growth Chart --    No data found.  Updated Vital Signs BP (!) 157/93 (BP Location: Right Arm)   Pulse 81   Temp 98 F (36.7 C) (Oral)   Resp 17   SpO2 95%   Visual Acuity Right Eye Distance:   Left Eye Distance:   Bilateral Distance:    Right Eye Near:   Left Eye Near:    Bilateral Near:     Physical Exam   UC Treatments / Results  Labs (all labs ordered are listed, but only abnormal results are displayed) Labs Reviewed - No data to display  EKG   Radiology No results found.  Procedures Procedures (including critical care time)  Medications Ordered in UC Medications - No data to display  Initial Impression / Assessment and Plan / UC Course  I have reviewed the triage vital signs and the nursing notes.  Pertinent labs & imaging results that were available during my care of the patient were reviewed by me and considered in my medical decision making (see chart for details).    Discussed exam findings and plan of care with patient, scripted doxycycline  and Tessalon  to cover respiratory infection ,strict go to ER precautions given.   Patient verbalized understanding to this provider.  Ddx: Acute respiratory infection, acute cough, pneumonia, sinusitis, viral illness, allergies Final Clinical Impressions(s) / UC Diagnoses   Final diagnoses:  Acute respiratory infection  Acute cough     Discharge Instructions      We are treating you for acute respiratory infection, take doxycycline  as prescribed, drink plenty of water, any antibiotic can upset your stomach, may take over-the-counter probiotics or yogurt with active cultures to help with this, take Tessalon  as prescribed for cough. Follow-up with your PCP If  you develop chest pain, shortness of breath, or worsening symptoms go to the emergency room for further evaluation     ED Prescriptions      Medication Sig Dispense Auth. Provider   doxycycline  (VIBRAMYCIN ) 100 MG capsule Take 1 capsule (100 mg total) by mouth 2 (two) times daily for 7 days. 14 capsule Justinian Miano, NP   benzonatate  (TESSALON ) 100 MG capsule Take 1 capsule (100 mg total) by mouth every 8 (eight) hours. 21 capsule Kelci Petrella, NP      PDMP not reviewed this encounter.     [1]  Social History Tobacco Use   Smoking status: Former    Current packs/day: 0.50    Average packs/day: 0.5 packs/day for 15.0 years (7.5 ttl pk-yrs)    Types: Cigarettes   Smokeless tobacco: Never   Tobacco comments:    Quit smoking cigarettes in 2002  Vaping Use   Vaping status: Never Used  Substance Use Topics   Alcohol use: Not Currently   Drug use: No     Donnesha Karg, Rilla, NP 09/14/24 1731

## 2024-09-20 ENCOUNTER — Other Ambulatory Visit: Payer: Self-pay | Admitting: Family Medicine

## 2024-09-28 ENCOUNTER — Telehealth: Payer: Self-pay | Admitting: Family Medicine

## 2024-09-28 NOTE — Telephone Encounter (Signed)
 Copied from CRM #[id number]. Topic: Medicare AWV >> Sep 28, 2024 10:39 AM Nathanel DEL wrote: LVM 09/28/2024 to reschedule 10/19/2024 AWV appt due to [facility] meeting/training.  New AWV appt date 10/21/2021 at 1:50pm. Please call to confirm date change.   Nathanel Paschal; Care Guide Ambulatory Clinical Support [place] [facility] Direct Dial: [phone]

## 2024-10-04 ENCOUNTER — Encounter: Payer: Self-pay | Admitting: Family Medicine

## 2024-10-04 ENCOUNTER — Ambulatory Visit: Admitting: Family Medicine

## 2024-10-04 VITALS — BP 130/82 | HR 69 | Temp 98.0°F | Resp 16 | Ht 63.0 in | Wt 169.4 lb

## 2024-10-04 DIAGNOSIS — K7581 Nonalcoholic steatohepatitis (NASH): Secondary | ICD-10-CM

## 2024-10-04 DIAGNOSIS — M2550 Pain in unspecified joint: Secondary | ICD-10-CM

## 2024-10-04 DIAGNOSIS — R7689 Other specified abnormal immunological findings in serum: Secondary | ICD-10-CM

## 2024-10-04 MED ORDER — SEMAGLUTIDE-WEIGHT MANAGEMENT 0.25 MG/0.5ML ~~LOC~~ SOAJ: 0.2500 mg | SUBCUTANEOUS | 0 refills | Status: DC

## 2024-10-04 MED ORDER — SEMAGLUTIDE-WEIGHT MANAGEMENT 2.4 MG/0.75ML ~~LOC~~ SOAJ: 2.4000 mg | SUBCUTANEOUS | 0 refills | Status: DC

## 2024-10-04 MED ORDER — SEMAGLUTIDE-WEIGHT MANAGEMENT 0.5 MG/0.5ML ~~LOC~~ SOAJ: 0.5000 mg | SUBCUTANEOUS | 0 refills | Status: DC

## 2024-10-04 MED ORDER — SEMAGLUTIDE-WEIGHT MANAGEMENT 1 MG/0.5ML ~~LOC~~ SOAJ: 1.0000 mg | SUBCUTANEOUS | 0 refills | Status: DC

## 2024-10-04 MED ORDER — SEMAGLUTIDE-WEIGHT MANAGEMENT 1.7 MG/0.75ML ~~LOC~~ SOAJ: 1.7000 mg | SUBCUTANEOUS | 0 refills | Status: DC

## 2024-10-04 NOTE — Progress Notes (Signed)
 Chief Complaint  Patient presents with   Referral    Discuss Referral     Subjective: Patient is a 64 y.o. female here for f/u.  Patient has been following with the GI team for fatty liver and possible cirrhosis.  GI drew autoimmune labs where the workup was mostly unremarkable with exception of a positive ANA 1:80.  The patient does have a history of Hashimoto's disease.  She also has diffuse joint pain, worse in her neck and back.  She had an ultrasound with elastography showing a lower risk of advanced chronic liver disease.  She was told to follow-up with us  for further evaluation/management.  Past Medical History:  Diagnosis Date   Allergy    Anxiety    Arthritis    Back pain    Breast cancer (HCC)    left   Fatty liver    GERD (gastroesophageal reflux disease)    Gout    Headache(784.0)    PMH : migraines   History of kidney stones    Hypercholesterolemia    Hypertension    Hypothyroidism    Hashimoto's   Osteoporosis    Personal history of chemotherapy    Personal history of radiation therapy    Pneumonia    PONV (postoperative nausea and vomiting)    difficult to wake up   Pre-diabetes    Psoriasis    PVC's (premature ventricular contractions)    Radiation 08/18/14-10/07/14   Left breast   Sleep apnea    no CPAP    Objective: BP 130/82 (BP Location: Left Arm, Patient Position: Sitting)   Pulse 69   Temp 98 F (36.7 C) (Oral)   Resp 16   Ht 5' 3 (1.6 m)   Wt 169 lb 6.4 oz (76.8 kg)   SpO2 95%   BMI 30.01 kg/m  General: Awake, appears stated age Lungs: No accessory muscle use Psych: Age appropriate judgment and insight, normal affect and mood  Assessment and Plan: Metabolic dysfunction-associated steatohepatitis (MASH) - Plan: semaglutide -weight management (WEGOVY ) 0.25 MG/0.5ML SOAJ SQ injection, semaglutide -weight management (WEGOVY ) 0.5 MG/0.5ML SOAJ SQ injection, semaglutide -weight management (WEGOVY ) 1 MG/0.5ML SOAJ SQ injection,  semaglutide -weight management (WEGOVY ) 1.7 MG/0.75ML SOAJ SQ injection, semaglutide -weight management (WEGOVY ) 2.4 MG/0.75ML SOAJ SQ injection  Arthralgia, unspecified joint - Plan: Anti-Smith antibody, Anti-DNA antibody, double-stranded, Sedimentation rate, Cyclic citrul peptide antibody, IgG (QUEST), Sjogrens syndrome-A extractable nuclear antibody, Sjogrens syndrome-B extractable nuclear antibody, Aldolase, Rheumatoid Factor, Uric acid  Positive ANA (antinuclear antibody) - Plan: Anti-Smith antibody, Anti-DNA antibody, double-stranded, Sedimentation rate, Cyclic citrul peptide antibody, IgG (QUEST), Sjogrens syndrome-A extractable nuclear antibody, Sjogrens syndrome-B extractable nuclear antibody, Aldolase, Rheumatoid Factor, Uric acid  Chronic, perhaps not ideally controlled.  We will see if insurance will cover Wegovy  for MASH.  She will let me know if there are cost issues.  This may help with inflammation as well. 2/3.  We discussed the utility of ANA screening in the absence of other positive results.  We will check the above given history of arthralgias.  If these are unremarkable, it is unlikely she needs to see the rheumatology team. The patient voiced understanding and agreement to the plan.  Mabel Mt Vidalia, DO 10/04/2024  3:05 PM

## 2024-10-04 NOTE — Addendum Note (Signed)
 Addended by: FRANN MABEL SQUIBB on: 10/04/2024 03:06 PM   Modules accepted: Level of Service

## 2024-10-04 NOTE — Patient Instructions (Addendum)
 Give us  4-5 business days to get the results of your labs back.   Let me know if there are cost issues.  Keep the diet clean and stay active.  Foods that may reduce pain: 1) Ginger 2) Blueberries 3) Salmon 4) Pumpkin seeds 5) Dark chocolate 6) Turmeric 7) Tart cherries 8) Virgin olive oil 9) Chili peppers 10) Mint 11) Krill oil  Let us  know if you need anything.

## 2024-10-05 ENCOUNTER — Other Ambulatory Visit (HOSPITAL_COMMUNITY): Payer: Self-pay

## 2024-10-05 ENCOUNTER — Ambulatory Visit: Payer: Self-pay | Admitting: Family Medicine

## 2024-10-05 ENCOUNTER — Telehealth: Payer: Self-pay

## 2024-10-05 LAB — SEDIMENTATION RATE: Sed Rate: 33 mm/h — ABNORMAL HIGH (ref 0–30)

## 2024-10-05 LAB — URIC ACID: Uric Acid, Serum: 8.2 mg/dL — ABNORMAL HIGH (ref 2.4–7.0)

## 2024-10-05 NOTE — Telephone Encounter (Signed)
 Clinical questions answered and pa submitted

## 2024-10-05 NOTE — Telephone Encounter (Signed)
 Pharmacy Patient Advocate Encounter   Received notification from RX Request Messages that prior authorization for Wegovy  0.25mg /0.85ml is required/requested.   Insurance verification completed.   The patient is insured through [facility].   Per test claim: PA required; PA started via CoverMyMeds. KEY [id number] . Waiting for clinical questions to populate.

## 2024-10-06 ENCOUNTER — Encounter: Payer: Self-pay | Admitting: Family Medicine

## 2024-10-06 LAB — ANTI-SMITH ANTIBODY: ENA SM Ab Ser-aCnc: <1 AI

## 2024-10-06 LAB — CYCLIC CITRUL PEPTIDE ANTIBODY, IGG: Cyclic Citrullin Peptide Ab: <16 U

## 2024-10-06 LAB — ANTI-DNA ANTIBODY, DOUBLE-STRANDED: ds DNA Ab: <1 [IU]/mL

## 2024-10-06 LAB — SJOGRENS SYNDROME-A EXTRACTABLE NUCLEAR ANTIBODY: SSA (Ro) (ENA) Antibody, IgG: <1 AI

## 2024-10-06 LAB — SJOGRENS SYNDROME-B EXTRACTABLE NUCLEAR ANTIBODY: SSB (La) (ENA) Antibody, IgG: <1 AI

## 2024-10-06 LAB — ALDOLASE: Aldolase: 6.5 U/L

## 2024-10-06 LAB — RHEUMATOID FACTOR: Rheumatoid fact SerPl-aCnc: 11 [IU]/mL

## 2024-10-06 NOTE — Telephone Encounter (Signed)
 Pharmacy Patient Advocate Encounter  Received notification from [employer] that Prior Authorization for Wegovy  0.25mg /0.30ml has been DENIED.  See denial reason below. No denial letter attached in CMM. Will attach denial letter to Media tab once received.   PA #/Case ID/Reference #: [id number]

## 2024-10-11 ENCOUNTER — Encounter: Payer: Self-pay | Admitting: Family Medicine

## 2024-10-11 ENCOUNTER — Other Ambulatory Visit: Payer: Self-pay | Admitting: Family Medicine

## 2024-10-11 MED ORDER — TIRZEPATIDE-WEIGHT MANAGEMENT 2.5 MG/0.5ML ~~LOC~~ SOLN: 2.5000 mg | SUBCUTANEOUS | 0 refills | Status: DC

## 2024-10-21 ENCOUNTER — Ambulatory Visit

## 2024-10-25 ENCOUNTER — Ambulatory Visit

## 2024-10-27 ENCOUNTER — Ambulatory Visit (INDEPENDENT_AMBULATORY_CARE_PROVIDER_SITE_OTHER)

## 2024-10-27 VITALS — BP 118/72 | Ht 64.0 in | Wt 165.0 lb

## 2024-10-27 DIAGNOSIS — Z0000 Encounter for general adult medical examination without abnormal findings: Secondary | ICD-10-CM | POA: Diagnosis not present

## 2024-10-27 NOTE — Progress Notes (Signed)
 " I connected with  Alexandria Bradley on 10/27/24 by a audio enabled telemedicine application and verified that I am speaking with the correct person using two identifiers.  Patient Location: Home  Provider Location: Home Office  Persons Participating in Visit: Patient.  I discussed the limitations of evaluation and management by telemedicine. The patient expressed understanding and agreed to proceed.  Vital Signs: Because this visit was a virtual/telehealth visit, some criteria may be missing or patient reported. Any vitals not documented were not able to be obtained and vitals that have been documented are patient reported.  Chief Complaint  Patient presents with   Medicare Wellness     Subjective:   Alexandria Bradley is a 64 y.o. female who presents for a Medicare Annual Wellness Visit.  Visit info / Clinical Intake: Medicare Wellness Visit Type:: Subsequent Annual Wellness Visit Persons participating in visit and providing information:: patient Medicare Wellness Visit Mode:: Telephone If telephone:: video declined Since this visit was completed virtually, some vitals may be partially provided or unavailable. Missing vitals are due to the limitations of the virtual format.: Documented vitals are patient reported If Telephone or Video please confirm:: I connected with patient using audio/video enable telemedicine. I verified patient identity with two identifiers, discussed telehealth limitations, and patient agreed to proceed. Patient Location:: home Provider Location:: home office Interpreter Needed?: No Pre-visit prep was completed: yes AWV questionnaire completed by patient prior to visit?: yes Date:: 10/26/24 Living arrangements:: (Patient-Rptd) lives with spouse/significant other Patient's Overall Health Status Rating: (Patient-Rptd) good Typical amount of pain: (!) (Patient-Rptd) a lot Does pain affect daily life?: (!) (Patient-Rptd) yes Are you currently prescribed  opioids?: no  Dietary Habits and Nutritional Risks How many meals a day?: (Patient-Rptd) 2 Eats fruit and vegetables daily?: (Patient-Rptd) yes Most meals are obtained by: (Patient-Rptd) preparing own meals In the last 2 weeks, have you had any of the following?: none Diabetic:: no  Functional Status Activities of Daily Living (to include ambulation/medication): (Patient-Rptd) Independent Ambulation: (Patient-Rptd) Independent Medication Administration: (Patient-Rptd) Independent Home Management (perform basic housework or laundry): (Patient-Rptd) Independent Manage your own finances?: (Patient-Rptd) yes Primary transportation is: (Patient-Rptd) driving Concerns about vision?: no *vision screening is required for WTM* Concerns about hearing?: no  Fall Screening Falls in the past year?: (Patient-Rptd) 0 Number of falls in past year: 0 Was there an injury with Fall?: 0 Fall Risk Category Calculator: 0 Patient Fall Risk Level: Low Fall Risk  Fall Risk Patient at Risk for Falls Due to: No Fall Risks Fall risk Follow up: Falls evaluation completed  Home and Transportation Safety: All rugs have non-skid backing?: (Patient-Rptd) yes All stairs or steps have railings?: (Patient-Rptd) yes Grab bars in the bathtub or shower?: (!) (Patient-Rptd) no Have non-skid surface in bathtub or shower?: (Patient-Rptd) yes Good home lighting?: (Patient-Rptd) yes Regular seat belt use?: (Patient-Rptd) yes Hospital stays in the last year:: (!) (Patient-Rptd) yes How many hospital stays:: (Patient-Rptd) 2  Cognitive Assessment Difficulty concentrating, remembering, or making decisions? : (Patient-Rptd) no Will 6CIT or Mini Cog be Completed: yes What year is it?: 0 points What month is it?: 0 points Give patient an address phrase to remember (5 components): [address] [place] About what time is it?: 0 points Count backwards from 20 to 1: 0 points Say the months of the year in reverse: 0  points Repeat the address phrase from earlier: 0 points 6 CIT Score: 0 points  Advance Directives (For Healthcare) Does Patient Have a Medical  Advance Directive?: No Would patient like information on creating a medical advance directive?: No - Patient declined  Reviewed/Updated  Reviewed/Updated: Reviewed All (Medical, Surgical, Family, Medications, Allergies, Care Teams, Patient Goals)    Allergies (verified) Chlorhexidine , Ciprofloxacin , Prednisone , Codeine, Cortisone, and Colchicine    Current Medications (verified) Outpatient Encounter Medications as of 10/27/2024  Medication Sig   acetaminophen  (TYLENOL ) 500 MG tablet Take 1,000 mg by mouth every 6 (six) hours as needed for mild pain (pain score 1-3) or moderate pain (pain score 4-6).   azelastine  (ASTELIN ) 0.1 % nasal spray Place 1-2 sprays into both nostrils daily as needed for rhinitis or allergies.   benzonatate  (TESSALON ) 100 MG capsule Take 1 capsule (100 mg total) by mouth every 8 (eight) hours.   diltiazem  (CARDIZEM  CD) 240 MG 24 hr capsule TAKE 1 CAPSULE BY MOUTH EVERY DAY   fexofenadine (ALLEGRA) 180 MG tablet Take 180 mg by mouth daily.   Levothyroxine  Sodium (TIROSINT ) 150 MCG CAPS Take 150 mcg by mouth daily before breakfast.   liothyronine  (CYTOMEL ) 5 MCG tablet Take 5 mcg by mouth daily before breakfast.    mometasone  (NASONEX ) 50 MCG/ACT nasal spray Place 1-2 sprays into the nose daily as needed (allergies).   polyethylene glycol powder (GLYCOLAX /MIRALAX ) 17 GM/SCOOP powder Take 17 g by mouth as needed.   potassium chloride  (KLOR-CON ) 10 MEQ tablet TAKE 1 TABLET BY MOUTH EVERY DAY   tirzepatide  (ZEPBOUND ) 2.5 MG/0.5ML injection vial Inject 2.5 mg into the skin once a week.   triamcinolone  cream (KENALOG ) 0.1 % Apply 1 Application topically daily as needed (psoriasis).   triamterene -hydrochlorothiazide  (MAXZIDE -25) 37.5-25 MG tablet Take 1 tablet by mouth daily.   [DISCONTINUED] prochlorperazine  (COMPAZINE ) 10 MG  tablet Take 1 tablet (10 mg total) by mouth every 6 (six) hours as needed (Nausea or vomiting).   No facility-administered encounter medications on file as of 10/27/2024.    History: Past Medical History:  Diagnosis Date   Allergy    Anxiety    Arthritis    Back pain    Breast cancer (HCC)    left   Fatty liver    GERD (gastroesophageal reflux disease)    Gout    Headache(784.0)    PMH : migraines   History of kidney stones    Hypercholesterolemia    Hypertension    Hypothyroidism    Hashimoto's   Osteoporosis    Personal history of chemotherapy    Personal history of radiation therapy    Pneumonia    PONV (postoperative nausea and vomiting)    difficult to wake up   Pre-diabetes    Psoriasis    PVC's (premature ventricular contractions)    Radiation 08/18/14-10/07/14   Left breast   Sleep apnea    no CPAP   Past Surgical History:  Procedure Laterality Date   BREAST BIOPSY Left 11/10/2018   BREAST LUMPECTOMY Left 2015   BREAST LUMPECTOMY WITH AXILLARY LYMPH NODE BIOPSY  02/2014   left   BREAST RECONSTRUCTION WITH PLACEMENT OF TISSUE EXPANDER AND ALLODERM Bilateral 04/12/2019   Procedure: BILATERAL BREAST RECONSTRUCTION WITH PLACEMENT OF TISSUE EXPANDERS; ALLODERM TO RIGHT CHEST;  Surgeon: Arelia Filippo, MD;  Location: [facility];  Service: Plastics;  Laterality: Bilateral;   BREAST RECONSTRUCTION WITH PLACEMENT OF TISSUE EXPANDER AND ALLODERM Right 01/24/2020   Procedure: BREAST RECONSTRUCTION WITH PLACEMENT OF TISSUE EXPANDER AND ALLODERM;  Surgeon: Arelia Filippo, MD;  Location: [facility];  Service: Plastics;  Laterality: Right;   CARDIAC CATHETERIZATION  CHOLECYSTECTOMY     Laparoscopic   colon polyps     COLONOSCOPY N/A 06/27/2014   Procedure: COLONOSCOPY;  Surgeon: Jerrell KYM Sol, MD;  Location: [facility] END[facility]COPY;  Service: Endoscopy;  Laterality: N/A;   COLONOSCOPY  02/2020   FLEXIBLE SIGMOIDOSCOPY N/A 06/18/2024   Procedure:  [place] SIDE;  Surgeon: [name], MD;  Location: [facility];  Service: General;  Laterality: N/A;  ICG PERFUSION   LATISSIMUS FLAP TO BREAST Left 04/12/2019   Procedure: LEFT LATISSIMUS DORSI FLAP TO LEFT CHEST;  Surgeon: Arelia Filippo, MD;  Location: [facility];  Service: Plastics;  Laterality: Left;   LEFT HEART CATH AND CORONARY ANGIOGRAPHY N/A 07/24/2018   Procedure: LEFT HEART CATH AND CORONARY ANGIOGRAPHY;  Surgeon: Burnard Debby LABOR, MD;  Location: [facility];  Service: Cardiovascular;  Laterality: N/A;   LIPOMA EXCISION     upper right back / shoulder   LIPOSUCTION WITH LIPOFILLING Bilateral 06/12/2020   Procedure: LIPOFILLING TO BILATERAL CHEST;  Surgeon: Arelia Filippo, MD;  Location: [facility];  Service: Plastics;  Laterality: Bilateral;   MASTECTOMY W/ SENTINEL NODE BIOPSY Bilateral 04/12/2019   Procedure: RIGHT BREAST RISK REDUCING MASTECTOMY; LEFT MASTECTOMY WITH LEFT AXILLARY SENTINEL LYMPH NODE BIOPSY WITH BLUE DYE INJECTION;  Surgeon: Ebbie Cough, MD;  Location: [facility];  Service: General;  Laterality: Bilateral;   MYOMECTOMY     PORT-A-CATH REMOVAL N/A 02/03/2015   Procedure: REMOVAL PORT-A-CATH;  Surgeon: Cough Ebbie, MD;  Location: [facility];  Service: General;  Laterality: N/A;   PORT-A-CATH REMOVAL Right 01/24/2020   Procedure: REMOVAL PORT-A-CATH;  Surgeon: Arelia Filippo, MD;  Location: [facility];  Service: Plastics;  Laterality: Right;   PORTACATH PLACEMENT N/A 03/01/2014   Procedure: INSERTION PORT-A-CATH;  Surgeon: Cough Ebbie, MD;  Location: [facility];  Service: General;  Laterality: N/A;   PORTACATH PLACEMENT N/A 11/18/2018   Procedure: INSERTION PORT-A-CATH WITH ULTRASOUND;  Surgeon: Ebbie Cough, MD;  Location: [facility];  Service: General;  Laterality: N/A;   REMOVAL OF BILATERAL TISSUE EXPANDERS WITH PLACEMENT OF BILATERAL BREAST IMPLANTS Bilateral  06/12/2020   Procedure: REMOVAL OF BILATERAL TISSUE EXPANDERS WITH PLACEMENT OF BILATERAL BREAST IMPLANTS;  Surgeon: Arelia Filippo, MD;  Location: [facility];  Service: Plastics;  Laterality: Bilateral;   REMOVAL OF TISSUE EXPANDER AND PLACEMENT OF IMPLANT Right 06/02/2019   Procedure: REMOVAL OF TISSUE EXPANDER RIGHT CHEST;  Surgeon: Arelia Filippo, MD;  Location: [facility];  Service: Plastics;  Laterality: Right;   TONSILLECTOMY     TUBAL LIGATION     WOUND DEBRIDEMENT Left 06/02/2019   Procedure: LEFT CHEST DEBRIDEMENT;  Surgeon: Arelia Filippo, MD;  Location: [facility];  Service: Plastics;  Laterality: Left;   XI ROBOTIC ASSISTED LOWER ANTERIOR RESECTION N/A 06/18/2024   Procedure: RESECTION, RECTUM, LOW ANTERIOR, ROBOT-ASSISTED;  Surgeon: [name], MD;  Location: [facility];  Service: General;  Laterality: N/A;   Family History  Problem Relation Age of Onset   Endometrial cancer Mother 1   Hearing loss Mother    Atrial fibrillation Mother    Lung cancer Father    Brain cancer Father    Barrett's esophagus Sister    Heart disease Maternal Aunt    Atrial fibrillation Maternal Aunt    Lung cancer Maternal Uncle    Atrial fibrillation Maternal Uncle    Bladder Cancer Maternal Uncle    Multiple myeloma Maternal Uncle    Stroke Maternal  Grandmother    Stroke Maternal Grandfather    Other Son        trisomy 11   Colon polyps Cousin    Colon cancer Neg Hx    Esophageal cancer Neg Hx    Rectal cancer Neg Hx    Stomach cancer Neg Hx    Social History   Occupational History   Not on file  Tobacco Use   Smoking status: Former    Current packs/day: 0.00    Average packs/day: 0.5 packs/day for 15.0 years (7.5 ttl pk-yrs)    Types: Cigarettes    Quit date: 09/30/1998    Years since quitting: 26.0   Smokeless tobacco: Never   Tobacco comments:    Quit smoking cigarettes in 2002  Vaping Use   Vaping status: Never  Used  Substance and Sexual Activity   Alcohol use: Not Currently   Drug use: Never   Sexual activity: Not Currently    Birth control/protection: Post-menopausal   Tobacco Counseling Counseling given: Not Answered Tobacco comments: Quit smoking cigarettes in 2002  SDOH Screenings   Food Insecurity: No Food Insecurity (10/27/2024)  Housing: Low Risk (10/27/2024)  Transportation Needs: No Transportation Needs (10/27/2024)  Utilities: Not At Risk (10/27/2024)  Alcohol Screen: Low Risk (10/14/2023)  Depression (PHQ2-9): Low Risk (10/27/2024)  Financial Resource Strain: Low Risk (04/16/2024)  Physical Activity: Patient Declined (10/27/2024)  Social Connections: Socially Integrated (10/27/2024)  Stress: No Stress Concern Present (10/27/2024)  Tobacco Use: Medium Risk (10/27/2024)  Health Literacy: Adequate Health Literacy (10/27/2024)   See flowsheets for full screening details  Depression Screen PHQ 2 & 9 Depression Scale- Over the past 2 weeks, how often have you been bothered by any of the following problems? Little interest or pleasure in doing things: 0 Feeling down, depressed, or hopeless (PHQ Adolescent also includes...irritable): 0 PHQ-2 Total Score: 0 Trouble falling or staying asleep, or sleeping too much: 0 Feeling tired or having little energy: 0 Poor appetite or overeating (PHQ Adolescent also includes...weight loss): 0 Feeling bad about yourself - or that you are a failure or have let yourself or your family down: 0 Trouble concentrating on things, such as reading the newspaper or watching television (PHQ Adolescent also includes...like school work): 0 Moving or speaking so slowly that other people could have noticed. Or the opposite - being so fidgety or restless that you have been moving around a lot more than usual: 0 Thoughts that you would be better off dead, or of hurting yourself in some way: 0 PHQ-9 Total Score: 0 If you checked off any problems, how difficult have these  problems made it for you to do your work, take care of things at home, or get along with other people?: Not difficult at all  Depression Treatment Depression Interventions/Treatment : EYV7-0 Score <4 Follow-up Not Indicated     Goals Addressed               This Visit's Progress     travel (pt-stated)               Objective:    Today's Vitals   10/27/24 1257  BP: 118/72  Weight: 165 lb (74.8 kg)  Height: 5' 4 (1.626 m)   Body mass index is 28.32 kg/m.  Hearing/Vision screen Hearing Screening - Comments:: No hearing difficulties  Vision Screening - Comments:: Patient wears glasses and contacts. Patient [facility] in [place] Immunizations and Health Maintenance Health Maintenance  Topic Date Due  Cervical Cancer Screening (HPV/Pap Cotest)  Never done   Medicare Annual Wellness (AWV)  10/13/2024   Influenza Vaccine  12/28/2024 (Originally 04/30/2024)   Diabetic kidney evaluation - Urine ACR  12/17/2027 (Originally 12/24/1978)   Diabetic kidney evaluation - eGFR measurement  08/31/2025   DTaP/Tdap/Td (2 - Td or Tdap) 05/28/2032   Colonoscopy  06/09/2033   Pneumococcal Vaccine: 50+ Years  Completed   Bone Density Scan  Completed   Hepatitis C Screening  Completed   HIV Screening  Completed   Hepatitis B Vaccines 19-59 Average Risk  Aged Out   Meningococcal B Vaccine  Aged Out   FOOT EXAM  Discontinued   Mammogram  Discontinued   HEMOGLOBIN A1C  Discontinued   OPHTHALMOLOGY EXAM  Discontinued   COVID-19 Vaccine  Discontinued   Zoster Vaccines- Shingrix  Discontinued        Assessment/Plan:  This is a routine wellness examination for [place].  Patient Care Team: [name], DO as PCP - General (Family Medicine) Delford Maude BROCKS, MD as PCP - Cardiology (Cardiology) Gorge Ade, MD (Inactive) as Consulting Physician (Obstetrics and Gynecology) Ebbie Cough, MD as Consulting Physician (General Surgery) Faythe Purchase, MD as  Consulting Physician (Endocrinology) Yvone Rush, MD as Consulting Physician (Orthopedic Surgery) Delford Maude BROCKS, MD as Consulting Physician (Cardiology) Arelia Filippo, MD as Consulting Physician (Plastic Surgery)  I have personally reviewed and noted the following in the patients chart:   Medical and social history Use of alcohol, tobacco or illicit drugs  Current medications and supplements including opioid prescriptions. Functional ability and status Nutritional status Physical activity Advanced directives List of other physicians Hospitalizations, surgeries, and ER visits in previous 12 months Vitals Screenings to include cognitive, depression, and falls Referrals and appointments  No orders of the defined types were placed in this encounter.  In addition, I have reviewed and discussed with patient certain preventive protocols, quality metrics, and best practice recommendations. A written personalized care plan for preventive services as well as general preventive health recommendations were provided to patient.   Lyle MARLA Right, [place]   10/27/2024   No follow-ups on file.  After Visit Summary: (MyChart) Due to this being a telephonic visit, the after visit summary with patients personalized plan was offered to patient via MyChart   Nurse Notes: no voiced concern "

## 2024-10-27 NOTE — Patient Instructions (Signed)
 Ms. Vanorder,  Thank you for taking the time for your Medicare Wellness Visit. I appreciate your continued commitment to your health goals. Please review the care plan we discussed, and feel free to reach out if I can assist you further.  Please note that Annual Wellness Visits do not include a physical exam. Some assessments may be limited, especially if the visit was conducted virtually. If needed, we may recommend an in-person follow-up with your provider.  Ongoing Care Seeing your primary care provider every 3 to 6 months helps us  monitor your health and provide consistent, personalized care.   Referrals If a referral was made during today's visit and you haven't received any updates within two weeks, please contact the referred provider directly to check on the status.  Recommended Screenings:  Health Maintenance  Topic Date Due   Pap with HPV screening  Never done   Medicare Annual Wellness Visit  10/13/2024   Flu Shot  12/28/2024*   Kidney health urinalysis for diabetes  12/17/2027*   Yearly kidney function blood test for diabetes  08/31/2025   DTaP/Tdap/Td vaccine (2 - Td or Tdap) 05/28/2032   Colon Cancer Screening  06/09/2033   Pneumococcal Vaccine for age over 62  Completed   Osteoporosis screening with Bone Density Scan  Completed   Hepatitis C Screening  Completed   HIV Screening  Completed   Hepatitis B Vaccine  Aged Out   Meningitis B Vaccine  Aged Out   Complete foot exam   Discontinued   Breast Cancer Screening  Discontinued   Hemoglobin A1C  Discontinued   Eye exam for diabetics  Discontinued   COVID-19 Vaccine  Discontinued   Zoster (Shingles) Vaccine  Discontinued  *Topic was postponed. The date shown is not the original due date.       10/26/2024   10:06 AM  Advanced Directives  Does Patient Have a Medical Advance Directive? No  Would patient like information on creating a medical advance directive? No - Patient declined    Vision: Annual vision  screenings are recommended for early detection of glaucoma, cataracts, and diabetic retinopathy. These exams can also reveal signs of chronic conditions such as diabetes and high blood pressure.  Dental: Annual dental screenings help detect early signs of oral cancer, gum disease, and other conditions linked to overall health, including heart disease and diabetes.  Please see the attached documents for additional preventive care recommendations.

## 2024-11-01 ENCOUNTER — Other Ambulatory Visit: Payer: Self-pay | Admitting: Family Medicine

## 2024-11-01 MED ORDER — TIRZEPATIDE-WEIGHT MANAGEMENT 5 MG/0.5ML ~~LOC~~ SOLN: 5.0000 mg | SUBCUTANEOUS | 0 refills | Status: DC

## 2024-11-01 NOTE — Telephone Encounter (Signed)
 Increase dose?

## 2024-11-02 ENCOUNTER — Encounter: Payer: Self-pay | Admitting: Family Medicine

## 2024-11-02 ENCOUNTER — Other Ambulatory Visit: Payer: Self-pay

## 2024-11-02 MED ORDER — TIRZEPATIDE-WEIGHT MANAGEMENT 5 MG/0.5ML ~~LOC~~ SOLN: 5.0000 mg | SUBCUTANEOUS | 0 refills | Status: AC

## 2024-11-05 ENCOUNTER — Ambulatory Visit: Admitting: Family Medicine

## 2024-11-05 ENCOUNTER — Encounter: Payer: Self-pay | Admitting: Family Medicine

## 2024-11-05 VITALS — BP 126/78 | HR 84 | Temp 97.8°F | Resp 16 | Ht 64.0 in | Wt 169.8 lb

## 2024-11-05 DIAGNOSIS — K7581 Nonalcoholic steatohepatitis (NASH): Secondary | ICD-10-CM

## 2024-11-05 DIAGNOSIS — S46811A Strain of other muscles, fascia and tendons at shoulder and upper arm level, right arm, initial encounter: Secondary | ICD-10-CM

## 2024-11-05 NOTE — Patient Instructions (Signed)
 Keep the diet clean and stay active.  Continue the Pepcid  as needed.  Stay hydrated.  Heat (pad or rice pillow in microwave) over affected area, 10-15 minutes twice daily.   Ice/cold pack over area for 10-15 min twice daily.  OK to take Tylenol  1000 mg (2 extra strength tabs) or 975 mg (3 regular strength tabs) every 6 hours as needed.  Let us  know if you need anything.  Trapezius stretches/exercises Do exercises exactly as told by your health care provider and adjust them as directed. It is normal to feel mild stretching, pulling, tightness, or discomfort as you do these exercises, but you should stop right away if you feel sudden pain or your pain gets worse.   Stretching and range of motion exercises These exercises warm up your muscles and joints and improve the movement and flexibility of your shoulder. These exercises can also help to relieve pain, numbness, and tingling. If you are unable to do any of the following for any reason, do not further attempt to do it.   Exercise A: Flexion, standing     Stand and hold a broomstick, a cane, or a similar object. Place your hands a little more than shoulder-width apart on the object. Your left / right hand should be palm-up, and your other hand should be palm-down. Push the stick to raise your left / right arm out to your side and then over your head. Use your other hand to help move the stick. Stop when you feel a stretch in your shoulder, or when you reach the angle that is recommended by your health care provider. Avoid shrugging your shoulder while you raise your arm. Keep your shoulder blade tucked down toward your spine. Hold for 30 seconds. Slowly return to the starting position. Repeat 2 times. Complete this exercise 3 times per week.  Exercise B: Abduction, supine     Lie on your back and hold a broomstick, a cane, or a similar object. Place your hands a little more than shoulder-width apart on the object. Your left / right  hand should be palm-up, and your other hand should be palm-down. Push the stick to raise your left / right arm out to your side and then over your head. Use your other hand to help move the stick. Stop when you feel a stretch in your shoulder, or when you reach the angle that is recommended by your health care provider. Avoid shrugging your shoulder while you raise your arm. Keep your shoulder blade tucked down toward your spine. Hold for 30 seconds. Slowly return to the starting position. Repeat 2 times. Complete this exercise 3 times per week.  Exercise C: Flexion, active-assisted     Lie on your back. You may bend your knees for comfort. Hold a broomstick, a cane, or a similar object. Place your hands about shoulder-width apart on the object. Your palms should face toward your feet. Raise the stick and move your arms over your head and behind your head, toward the floor. Use your healthy arm to help your left / right arm move farther. Stop when you feel a gentle stretch in your shoulder, or when you reach the angle where your health care provider tells you to stop. Hold for 30 seconds. Slowly return to the starting position. Repeat 2 times. Complete this exercise 3 times per week.  Exercise D: External rotation and abduction     Stand in a door frame with one of your feet slightly in front  of the other. This is called a staggered stance. Choose one of the following positions as told by your health care provider: Place your hands and forearms on the door frame above your head. Place your hands and forearms on the door frame at the height of your head. Place your hands on the door frame at the height of your elbows. Slowly move your weight onto your front foot until you feel a stretch across your chest and in the front of your shoulders. Keep your head and chest upright and keep your abdominal muscles tight. Hold for 30 seconds. To release the stretch, shift your weight to your back  foot. Repeat 2 times. Complete this stretch 3 times per week.  Strengthening exercises These exercises build strength and endurance in your shoulder. Endurance is the ability to use your muscles for a long time, even after your muscles get tired. Exercise E: Scapular depression and adduction  Sit on a stable chair. Support your arms in front of you with pillows, armrests, or a tabletop. Keep your elbows in line with the sides of your body. Gently move your shoulder blades down toward your middle back. Relax the muscles on the tops of your shoulders and in the back of your neck. Hold for 3 seconds. Slowly release the tension and relax your muscles completely before doing this exercise again. Repeat for a total of 10 repetitions. After you have practiced this exercise, try doing the exercise without the arm support. Then, try the exercise while standing instead of sitting. Repeat 2 times. Complete this exercise 3 times per week.  Exercise F: Shoulder abduction, isometric     Stand or sit about 4-6 inches (10-15 cm) from a wall with your left / right side facing the wall. Bend your left / right elbow and gently press your elbow against the wall. Increase the pressure slowly until you are pressing as hard as you can without shrugging your shoulder. Hold for 3 seconds. Slowly release the tension and relax your muscles completely. Repeat for a total of 10 repetitions. Repeat 2 times. Complete this exercise 3 times per week.  Exercise G: Shoulder flexion, isometric     Stand or sit about 4-6 inches (10-15 cm) away from a wall with your left / right side facing the wall. Keep your left / right elbow straight and gently press the top of your fist against the wall. Increase the pressure slowly until you are pressing as hard as you can without shrugging your shoulder. Hold for 10-15 seconds. Slowly release the tension and relax your muscles completely. Repeat for a total of 10 repetitions. Repeat  2 times. Complete this exercise 3 times per week.  Exercise H: Internal rotation     Sit in a stable chair without armrests, or stand. Secure an exercise band at your left / right side, at elbow height. Place a soft object, such as a folded towel or a small pillow, under your left / right upper arm so your elbow is a few inches (about 8 cm) away from your side. Hold the end of the exercise band so the band stretches. Keeping your elbow pressed against the soft object under your arm, move your forearm across your body toward your abdomen. Keep your body steady so the movement is only coming from your shoulder. Hold for 3 seconds. Slowly return to the starting position. Repeat for a total of 10 repetitions. Repeat 2 times. Complete this exercise 3 times per week.  Exercise I:  External rotation     Sit in a stable chair without armrests, or stand. Secure an exercise band at your left / right side, at elbow height. Place a soft object, such as a folded towel or a small pillow, under your left / right upper arm so your elbow is a few inches (about 8 cm) away from your side. Hold the end of the exercise band so the band stretches. Keeping your elbow pressed against the soft object under your arm, move your forearm out, away from your abdomen. Keep your body steady so the movement is only coming from your shoulder. Hold for 3 seconds. Slowly return to the starting position. Repeat for a total of 10 repetitions. Repeat 2 times. Complete this exercise 3 times per week. Exercise J: Shoulder extension  Sit in a stable chair without armrests, or stand. Secure an exercise band to a stable object in front of you so the band is at shoulder height. Hold one end of the exercise band in each hand. Your palms should face each other. Straighten your elbows and lift your hands up to shoulder height. Step back, away from the secured end of the exercise band, until the band stretches. Squeeze your shoulder  blades together and pull your hands down to the sides of your thighs. Stop when your hands are straight down by your sides. Do not let your hands go behind your body. Hold for 3 seconds. Slowly return to the starting position. Repeat for a total of 10 repetitions. Repeat 2 times. Complete this exercise 3 times per week.  Exercise K: Shoulder extension, prone     Lie on your abdomen on a firm surface so your left / right arm hangs over the edge. Hold a 5 lb weight in your hand so your palm faces in toward your body. Your arm should be straight. Squeeze your shoulder blade down toward the middle of your back. Slowly raise your arm behind you, up to the height of the surface that you are lying on. Keep your arm straight. Hold for 3 seconds. Slowly return to the starting position and relax your muscles. Repeat for a total of 10 repetitions. Repeat 2 times. Complete this exercise 3 times per week.   Exercise L: Horizontal abduction, prone  Lie on your abdomen on a firm surface so your left / right arm hangs over the edge. Hold a 5 lb weight in your hand so your palm faces toward your feet. Your arm should be straight. Squeeze your shoulder blade down toward the middle of your back. Bend your elbow so your hand moves up, until your elbow is bent to an L shape (90 degrees). With your elbow bent, slowly move your forearm forward and up. Raise your hand up to the height of the surface that you are lying on. Your upper arm should not move, and your elbow should stay bent. At the top of the movement, your palm should face the floor. Hold for 3 seconds. Slowly return to the starting position and relax your muscles. Repeat for a total of 10 repetitions. Repeat 2 times. Complete this exercise 3 times per week.  Exercise M: Horizontal abduction, standing  Sit on a stable chair, or stand. Secure an exercise band to a stable object in front of you so the band is at shoulder height. Hold one end of the  exercise band in each hand. Straighten your elbows and lift your hands straight in front of you, up to shoulder height.  Your palms should face down, toward the floor. Step back, away from the secured end of the exercise band, until the band stretches. Move your arms out to your sides, and keep your arms straight. Hold for 3 seconds. Slowly return to the starting position. Repeat for a total of 10 repetitions. Repeat 2 times. Complete this exercise 3 times per week.  Exercise N: Scapular retraction and elevation  Sit on a stable chair, or stand. Secure an exercise band to a stable object in front of you so the band is at shoulder height. Hold one end of the exercise band in each hand. Your palms should face each other. Sit in a stable chair without armrests, or stand. Step back, away from the secured end of the exercise band, until the band stretches. Squeeze your shoulder blades together and lift your hands over your head. Keep your elbows straight. Hold for 3 seconds. Slowly return to the starting position. Repeat for a total of 10 repetitions. Repeat 2 times. Complete this exercise 3 times per week.  This information is not intended to replace advice given to you by your health care provider. Make sure you discuss any questions you have with your health care provider. Document Released: 09/16/2005 Document Revised: 05/23/2016 Document Reviewed: 08/03/2015 Elsevier Interactive Patient Education  2017 [employer].

## 2024-11-05 NOTE — Progress Notes (Signed)
 Chief Complaint  Patient presents with   Medication Refill    Medication Refill     Subjective: Patient is a 64 y.o. female here for f/u.  Taking Zepbound  2.5 mg/week. Compliant, no AE's. Has some indigestion. Pepcid  seems to help. Is not exercising. She is eating healthy overall. Diet is a little lower for a few days and then it wears off.   Over the past several months has had right shoulder pain.  Started after the surgery in September 2025.  No other recent injury or change in activity.  Has been taking Tylenol  with some relief.  No neurologic signs or symptoms.  No bruising, redness, or swelling.  Past Medical History:  Diagnosis Date   Allergy    Anxiety    Arthritis    Back pain    Breast cancer (HCC)    left   Fatty liver    GERD (gastroesophageal reflux disease)    Gout    Headache(784.0)    PMH : migraines   History of kidney stones    Hypercholesterolemia    Hypertension    Hypothyroidism    Hashimoto's   Osteoporosis    Personal history of chemotherapy    Personal history of radiation therapy    Pneumonia    PONV (postoperative nausea and vomiting)    difficult to wake up   Pre-diabetes    Psoriasis    PVC's (premature ventricular contractions)    Radiation 08/18/14-10/07/14   Left breast   Sleep apnea    no CPAP    Objective: BP 126/78 (BP Location: Left Arm, Patient Position: Sitting)   Pulse 84   Temp 97.8 F (36.6 C) (Oral)   Resp 16   Ht 5' 4 (1.626 m)   Wt 169 lb 12.8 oz (77 kg)   SpO2 96%   BMI 29.15 kg/m  General: Awake, appears stated age Heart: RRR, no LE edema Lungs: CTAB, no rales, wheezes or rhonchi. No accessory muscle use MSK: TTP and hypertonicity over the right trapezius musculature.  Negative Spurling's, Hawkins, empty can, liftoff, speeds Psych: Age appropriate judgment and insight, normal affect and mood  Assessment and Plan: Metabolic dysfunction-associated steatohepatitis (MASH)  Strain of right trapezius muscle,  initial encounter  Chronic, not stable. Cont dosage escalation of Zepbound  starting 5 mg/week next week. Counseled on diet/exercise. Heat, ice, Tylenol .  Stretches and exercises.  Consider physical therapy and/or trigger point injection if not improving. F/u in 4 mo for med ck.  The patient voiced understanding and agreement to the plan.  Mabel [place], DO 11/05/24  2:38 PM

## 2025-03-04 ENCOUNTER — Ambulatory Visit: Admitting: Family Medicine
